# Patient Record
Sex: Female | Born: 1955
Health system: Southern US, Community
[De-identification: ages and names within clinical notes are randomized; demographics above are authoritative.]

## PROBLEM LIST (undated history)

## (undated) ENCOUNTER — Emergency Department (HOSPITAL_BASED_OUTPATIENT_CLINIC_OR_DEPARTMENT_OTHER): Admission: EM | Payer: Medicare Other

## (undated) ENCOUNTER — Emergency Department (HOSPITAL_COMMUNITY): Disposition: A | Discharge status: Home / Self Care | Payer: Medicare Other

## (undated) ENCOUNTER — Emergency Department (HOSPITAL_COMMUNITY): Discharge status: Absent without leave | Payer: Self-pay

## (undated) DIAGNOSIS — R7611 Nonspecific reaction to tuberculin skin test without active tuberculosis: Secondary | ICD-10-CM

## (undated) DIAGNOSIS — T7840XA Allergy, unspecified, initial encounter: Secondary | ICD-10-CM

## (undated) DIAGNOSIS — D229 Melanocytic nevi, unspecified: Secondary | ICD-10-CM

## (undated) DIAGNOSIS — E785 Hyperlipidemia, unspecified: Secondary | ICD-10-CM

## (undated) DIAGNOSIS — K589 Irritable bowel syndrome without diarrhea: Secondary | ICD-10-CM

## (undated) DIAGNOSIS — K219 Gastro-esophageal reflux disease without esophagitis: Secondary | ICD-10-CM

## (undated) DIAGNOSIS — L309 Dermatitis, unspecified: Secondary | ICD-10-CM

## (undated) DIAGNOSIS — J189 Pneumonia, unspecified organism: Secondary | ICD-10-CM

## (undated) DIAGNOSIS — U071 COVID-19: Secondary | ICD-10-CM

## (undated) DIAGNOSIS — J449 Chronic obstructive pulmonary disease, unspecified: Secondary | ICD-10-CM

## (undated) DIAGNOSIS — J45909 Unspecified asthma, uncomplicated: Secondary | ICD-10-CM

## (undated) DIAGNOSIS — M1990 Unspecified osteoarthritis, unspecified site: Secondary | ICD-10-CM

## (undated) DIAGNOSIS — G5603 Carpal tunnel syndrome, bilateral upper limbs: Secondary | ICD-10-CM

## (undated) DIAGNOSIS — H269 Unspecified cataract: Secondary | ICD-10-CM

## (undated) HISTORY — DX: Nonspecific reaction to tuberculin skin test without active tuberculosis: R76.11

## (undated) HISTORY — DX: Melanocytic nevi, unspecified: D22.9

## (undated) HISTORY — DX: Allergy, unspecified, initial encounter: T78.40XA

## (undated) HISTORY — DX: Unspecified asthma, uncomplicated: J45.909

## (undated) HISTORY — DX: Hyperlipidemia, unspecified: E78.5

## (undated) HISTORY — DX: Unspecified osteoarthritis, unspecified site: M19.90

## (undated) HISTORY — DX: Irritable bowel syndrome, unspecified: K58.9

## (undated) HISTORY — PX: BREAST LUMPECTOMY: SHX2

## (undated) HISTORY — PX: BREAST EXCISIONAL BIOPSY: SUR124

## (undated) HISTORY — DX: Pneumonia, unspecified organism: J18.9

## (undated) HISTORY — DX: Carpal tunnel syndrome, bilateral upper limbs: G56.03

---
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
## 1981-09-17 HISTORY — PX: VAGINAL HYSTERECTOMY: SUR661

## 1988-09-17 HISTORY — PX: BREAST EXCISIONAL BIOPSY: SUR124

## 1997-12-31 ENCOUNTER — Encounter: Admission: RE | Admit: 1997-12-31 | Discharge: 1997-12-31 | Payer: Self-pay | Admitting: Family Medicine

## 1998-04-18 ENCOUNTER — Encounter: Admission: RE | Admit: 1998-04-18 | Discharge: 1998-04-18 | Payer: Self-pay | Admitting: Family Medicine

## 1998-07-14 ENCOUNTER — Encounter: Admission: RE | Admit: 1998-07-14 | Discharge: 1998-07-14 | Payer: Self-pay | Admitting: Family Medicine

## 1998-07-28 ENCOUNTER — Encounter: Admission: RE | Admit: 1998-07-28 | Discharge: 1998-07-28 | Payer: Self-pay | Admitting: Family Medicine

## 1998-08-23 ENCOUNTER — Encounter: Admission: RE | Admit: 1998-08-23 | Discharge: 1998-10-25 | Payer: Self-pay

## 1998-08-23 ENCOUNTER — Encounter: Admission: RE | Admit: 1998-08-23 | Discharge: 1998-08-23 | Payer: Self-pay | Admitting: Sports Medicine

## 1998-09-20 ENCOUNTER — Encounter: Admission: RE | Admit: 1998-09-20 | Discharge: 1998-09-20 | Payer: Self-pay | Admitting: Sports Medicine

## 1998-10-10 ENCOUNTER — Encounter: Admission: RE | Admit: 1998-10-10 | Discharge: 1998-10-10 | Payer: Self-pay | Admitting: Family Medicine

## 1998-10-26 ENCOUNTER — Encounter: Admission: RE | Admit: 1998-10-26 | Discharge: 1998-10-26 | Payer: Self-pay | Admitting: Family Medicine

## 1998-11-08 ENCOUNTER — Encounter: Admission: RE | Admit: 1998-11-08 | Discharge: 1998-11-08 | Payer: Self-pay | Admitting: Sports Medicine

## 1998-11-17 ENCOUNTER — Encounter: Admission: RE | Admit: 1998-11-17 | Discharge: 1998-11-17 | Payer: Self-pay | Admitting: Family Medicine

## 1998-11-18 ENCOUNTER — Encounter: Payer: Self-pay | Admitting: General Surgery

## 1998-11-18 ENCOUNTER — Ambulatory Visit (HOSPITAL_COMMUNITY): Admission: RE | Admit: 1998-11-18 | Discharge: 1998-11-18 | Payer: Self-pay | Admitting: General Surgery

## 1998-12-06 ENCOUNTER — Encounter: Admission: RE | Admit: 1998-12-06 | Discharge: 1998-12-06 | Payer: Self-pay | Admitting: Sports Medicine

## 1998-12-23 ENCOUNTER — Encounter: Admission: RE | Admit: 1998-12-23 | Discharge: 1998-12-23 | Payer: Self-pay | Admitting: Family Medicine

## 1998-12-26 ENCOUNTER — Encounter: Admission: RE | Admit: 1998-12-26 | Discharge: 1998-12-26 | Payer: Self-pay | Admitting: Family Medicine

## 1999-02-14 ENCOUNTER — Encounter: Admission: RE | Admit: 1999-02-14 | Discharge: 1999-02-14 | Payer: Self-pay | Admitting: Family Medicine

## 1999-02-16 ENCOUNTER — Encounter: Admission: RE | Admit: 1999-02-16 | Discharge: 1999-02-16 | Payer: Self-pay | Admitting: Family Medicine

## 1999-03-15 ENCOUNTER — Encounter: Admission: RE | Admit: 1999-03-15 | Discharge: 1999-03-15 | Payer: Self-pay | Admitting: Family Medicine

## 1999-03-29 ENCOUNTER — Encounter: Admission: RE | Admit: 1999-03-29 | Discharge: 1999-03-29 | Payer: Self-pay | Admitting: Family Medicine

## 1999-03-30 ENCOUNTER — Emergency Department (HOSPITAL_COMMUNITY): Admission: EM | Admit: 1999-03-30 | Discharge: 1999-03-30 | Payer: Self-pay | Admitting: Emergency Medicine

## 1999-04-03 ENCOUNTER — Encounter: Admission: RE | Admit: 1999-04-03 | Discharge: 1999-04-03 | Payer: Self-pay | Admitting: Family Medicine

## 1999-04-04 ENCOUNTER — Encounter: Admission: RE | Admit: 1999-04-04 | Discharge: 1999-05-03 | Payer: Self-pay | Admitting: Family Medicine

## 1999-04-12 ENCOUNTER — Encounter: Admission: RE | Admit: 1999-04-12 | Discharge: 1999-04-12 | Payer: Self-pay | Admitting: Family Medicine

## 1999-04-21 ENCOUNTER — Encounter: Admission: RE | Admit: 1999-04-21 | Discharge: 1999-04-21 | Payer: Self-pay | Admitting: Family Medicine

## 1999-05-11 ENCOUNTER — Encounter: Admission: RE | Admit: 1999-05-11 | Discharge: 1999-05-11 | Payer: Self-pay | Admitting: Family Medicine

## 1999-05-23 ENCOUNTER — Encounter: Admission: RE | Admit: 1999-05-23 | Discharge: 1999-05-23 | Payer: Self-pay | Admitting: Family Medicine

## 1999-05-30 ENCOUNTER — Encounter: Admission: RE | Admit: 1999-05-30 | Discharge: 1999-05-30 | Payer: Self-pay | Admitting: Family Medicine

## 1999-06-13 ENCOUNTER — Encounter: Admission: RE | Admit: 1999-06-13 | Discharge: 1999-06-13 | Payer: Self-pay | Admitting: Family Medicine

## 1999-07-07 ENCOUNTER — Encounter: Admission: RE | Admit: 1999-07-07 | Discharge: 1999-07-07 | Payer: Self-pay | Admitting: Family Medicine

## 1999-07-14 ENCOUNTER — Encounter: Admission: RE | Admit: 1999-07-14 | Discharge: 1999-07-14 | Payer: Self-pay | Admitting: Sports Medicine

## 1999-07-19 ENCOUNTER — Encounter: Admission: RE | Admit: 1999-07-19 | Discharge: 1999-07-19 | Payer: Self-pay | Admitting: Sports Medicine

## 1999-07-19 ENCOUNTER — Encounter: Admission: RE | Admit: 1999-07-19 | Discharge: 1999-07-19 | Payer: Self-pay | Admitting: Family Medicine

## 1999-07-19 ENCOUNTER — Encounter: Payer: Self-pay | Admitting: Sports Medicine

## 1999-08-02 ENCOUNTER — Encounter: Admission: RE | Admit: 1999-08-02 | Discharge: 1999-08-02 | Payer: Self-pay | Admitting: Family Medicine

## 1999-08-15 ENCOUNTER — Encounter: Admission: RE | Admit: 1999-08-15 | Discharge: 1999-08-15 | Payer: Self-pay | Admitting: Sports Medicine

## 1999-08-18 ENCOUNTER — Encounter: Payer: Self-pay | Admitting: Sports Medicine

## 1999-08-18 ENCOUNTER — Encounter: Admission: RE | Admit: 1999-08-18 | Discharge: 1999-08-18 | Payer: Self-pay | Admitting: Sports Medicine

## 1999-09-01 ENCOUNTER — Encounter: Admission: RE | Admit: 1999-09-01 | Discharge: 1999-09-01 | Payer: Self-pay | Admitting: Family Medicine

## 1999-09-06 ENCOUNTER — Encounter: Payer: Self-pay | Admitting: Sports Medicine

## 1999-09-06 ENCOUNTER — Encounter: Admission: RE | Admit: 1999-09-06 | Discharge: 1999-09-06 | Payer: Self-pay | Admitting: Sports Medicine

## 1999-09-14 ENCOUNTER — Encounter: Admission: RE | Admit: 1999-09-14 | Discharge: 1999-09-14 | Payer: Self-pay | Admitting: Family Medicine

## 1999-09-21 ENCOUNTER — Encounter: Admission: RE | Admit: 1999-09-21 | Discharge: 1999-09-21 | Payer: Self-pay | Admitting: Family Medicine

## 1999-09-22 ENCOUNTER — Encounter: Admission: RE | Admit: 1999-09-22 | Discharge: 1999-09-22 | Payer: Self-pay | Admitting: Family Medicine

## 1999-09-25 ENCOUNTER — Ambulatory Visit: Admission: RE | Admit: 1999-09-25 | Discharge: 1999-09-25 | Payer: Self-pay | Admitting: *Deleted

## 1999-09-28 ENCOUNTER — Encounter: Admission: RE | Admit: 1999-09-28 | Discharge: 1999-09-28 | Payer: Self-pay | Admitting: Family Medicine

## 1999-10-12 ENCOUNTER — Encounter: Admission: RE | Admit: 1999-10-12 | Discharge: 1999-10-12 | Payer: Self-pay | Admitting: Family Medicine

## 1999-10-13 ENCOUNTER — Encounter: Admission: RE | Admit: 1999-10-13 | Discharge: 1999-10-13 | Payer: Self-pay | Admitting: Family Medicine

## 1999-10-19 ENCOUNTER — Encounter: Admission: RE | Admit: 1999-10-19 | Discharge: 1999-10-19 | Payer: Self-pay | Admitting: Family Medicine

## 1999-11-08 ENCOUNTER — Ambulatory Visit (HOSPITAL_COMMUNITY): Admission: RE | Admit: 1999-11-08 | Discharge: 1999-11-08 | Payer: Self-pay | Admitting: Family Medicine

## 1999-11-28 ENCOUNTER — Encounter: Payer: Self-pay | Admitting: Gastroenterology

## 1999-11-28 ENCOUNTER — Ambulatory Visit (HOSPITAL_COMMUNITY): Admission: RE | Admit: 1999-11-28 | Discharge: 1999-11-28 | Payer: Self-pay | Admitting: Gastroenterology

## 1999-12-06 ENCOUNTER — Encounter: Admission: RE | Admit: 1999-12-06 | Discharge: 1999-12-06 | Payer: Self-pay | Admitting: Family Medicine

## 1999-12-13 ENCOUNTER — Encounter: Admission: RE | Admit: 1999-12-13 | Discharge: 1999-12-13 | Payer: Self-pay | Admitting: Family Medicine

## 1999-12-22 ENCOUNTER — Encounter: Admission: RE | Admit: 1999-12-22 | Discharge: 1999-12-22 | Payer: Self-pay | Admitting: *Deleted

## 2000-01-10 ENCOUNTER — Encounter: Admission: RE | Admit: 2000-01-10 | Discharge: 2000-01-10 | Payer: Self-pay | Admitting: Family Medicine

## 2000-01-23 ENCOUNTER — Encounter: Admission: RE | Admit: 2000-01-23 | Discharge: 2000-01-23 | Payer: Self-pay | Admitting: Family Medicine

## 2000-03-12 ENCOUNTER — Encounter: Admission: RE | Admit: 2000-03-12 | Discharge: 2000-03-12 | Payer: Self-pay | Admitting: Family Medicine

## 2000-06-19 ENCOUNTER — Ambulatory Visit (HOSPITAL_COMMUNITY): Admission: RE | Admit: 2000-06-19 | Discharge: 2000-06-19 | Payer: Self-pay | Admitting: Gastroenterology

## 2000-06-19 ENCOUNTER — Encounter: Payer: Self-pay | Admitting: Gastroenterology

## 2000-06-24 ENCOUNTER — Encounter: Admission: RE | Admit: 2000-06-24 | Discharge: 2000-06-24 | Payer: Self-pay | Admitting: Sports Medicine

## 2000-07-11 ENCOUNTER — Encounter: Payer: Self-pay | Admitting: Gastroenterology

## 2000-07-11 ENCOUNTER — Ambulatory Visit (HOSPITAL_COMMUNITY): Admission: RE | Admit: 2000-07-11 | Discharge: 2000-07-11 | Payer: Self-pay | Admitting: Gastroenterology

## 2000-08-16 ENCOUNTER — Encounter: Admission: RE | Admit: 2000-08-16 | Discharge: 2000-08-16 | Payer: Self-pay | Admitting: Family Medicine

## 2000-11-18 ENCOUNTER — Encounter: Admission: RE | Admit: 2000-11-18 | Discharge: 2000-11-18 | Payer: Self-pay | Admitting: Family Medicine

## 2000-12-18 ENCOUNTER — Encounter: Admission: RE | Admit: 2000-12-18 | Discharge: 2000-12-18 | Payer: Self-pay | Admitting: Family Medicine

## 2000-12-19 ENCOUNTER — Encounter: Admission: RE | Admit: 2000-12-19 | Discharge: 2000-12-19 | Payer: Self-pay | Admitting: Family Medicine

## 2001-03-25 ENCOUNTER — Encounter: Admission: RE | Admit: 2001-03-25 | Discharge: 2001-03-25 | Payer: Self-pay | Admitting: Sports Medicine

## 2001-04-10 ENCOUNTER — Encounter: Admission: RE | Admit: 2001-04-10 | Discharge: 2001-04-10 | Payer: Self-pay | Admitting: Family Medicine

## 2001-05-07 ENCOUNTER — Encounter: Admission: RE | Admit: 2001-05-07 | Discharge: 2001-05-07 | Payer: Self-pay | Admitting: Family Medicine

## 2001-06-27 ENCOUNTER — Encounter: Admission: RE | Admit: 2001-06-27 | Discharge: 2001-06-27 | Payer: Self-pay | Admitting: Family Medicine

## 2001-07-17 ENCOUNTER — Encounter: Admission: RE | Admit: 2001-07-17 | Discharge: 2001-07-17 | Payer: Self-pay | Admitting: Family Medicine

## 2001-09-17 ENCOUNTER — Encounter (INDEPENDENT_AMBULATORY_CARE_PROVIDER_SITE_OTHER): Payer: Self-pay | Admitting: *Deleted

## 2001-09-26 ENCOUNTER — Encounter: Admission: RE | Admit: 2001-09-26 | Discharge: 2001-09-26 | Payer: Self-pay | Admitting: Family Medicine

## 2001-10-02 ENCOUNTER — Encounter: Admission: RE | Admit: 2001-10-02 | Discharge: 2001-10-02 | Payer: Self-pay | Admitting: *Deleted

## 2001-10-30 ENCOUNTER — Encounter: Admission: RE | Admit: 2001-10-30 | Discharge: 2001-10-30 | Payer: Self-pay | Admitting: Family Medicine

## 2001-11-13 ENCOUNTER — Encounter: Admission: RE | Admit: 2001-11-13 | Discharge: 2001-11-13 | Payer: Self-pay | Admitting: Family Medicine

## 2001-12-09 ENCOUNTER — Encounter: Admission: RE | Admit: 2001-12-09 | Discharge: 2001-12-09 | Payer: Self-pay | Admitting: Family Medicine

## 2001-12-25 ENCOUNTER — Encounter: Admission: RE | Admit: 2001-12-25 | Discharge: 2001-12-25 | Payer: Self-pay | Admitting: Family Medicine

## 2002-02-27 ENCOUNTER — Encounter: Admission: RE | Admit: 2002-02-27 | Discharge: 2002-02-27 | Payer: Self-pay | Admitting: Family Medicine

## 2002-03-06 ENCOUNTER — Encounter: Admission: RE | Admit: 2002-03-06 | Discharge: 2002-03-06 | Payer: Self-pay | Admitting: Family Medicine

## 2002-03-12 ENCOUNTER — Ambulatory Visit (HOSPITAL_COMMUNITY): Admission: RE | Admit: 2002-03-12 | Discharge: 2002-03-12 | Payer: Self-pay | Admitting: *Deleted

## 2002-05-14 ENCOUNTER — Encounter: Admission: RE | Admit: 2002-05-14 | Discharge: 2002-05-14 | Payer: Self-pay | Admitting: Family Medicine

## 2002-08-06 ENCOUNTER — Encounter: Admission: RE | Admit: 2002-08-06 | Discharge: 2002-08-06 | Payer: Self-pay | Admitting: Sports Medicine

## 2002-08-20 ENCOUNTER — Encounter: Admission: RE | Admit: 2002-08-20 | Discharge: 2002-08-20 | Payer: Self-pay | Admitting: Family Medicine

## 2002-08-20 ENCOUNTER — Ambulatory Visit (HOSPITAL_COMMUNITY): Admission: RE | Admit: 2002-08-20 | Discharge: 2002-08-20 | Payer: Self-pay | Admitting: Sports Medicine

## 2002-09-25 ENCOUNTER — Ambulatory Visit (HOSPITAL_COMMUNITY): Admission: RE | Admit: 2002-09-25 | Discharge: 2002-09-25 | Payer: Self-pay | Admitting: Sports Medicine

## 2002-10-02 ENCOUNTER — Encounter: Admission: RE | Admit: 2002-10-02 | Discharge: 2002-10-02 | Payer: Self-pay | Admitting: Sports Medicine

## 2002-11-04 ENCOUNTER — Encounter: Admission: RE | Admit: 2002-11-04 | Discharge: 2002-11-04 | Payer: Self-pay | Admitting: Family Medicine

## 2002-11-17 ENCOUNTER — Encounter: Admission: RE | Admit: 2002-11-17 | Discharge: 2002-11-17 | Payer: Self-pay | Admitting: Sports Medicine

## 2002-11-19 ENCOUNTER — Encounter: Payer: Self-pay | Admitting: Family Medicine

## 2002-11-19 ENCOUNTER — Encounter: Admission: RE | Admit: 2002-11-19 | Discharge: 2002-11-19 | Payer: Self-pay | Admitting: Family Medicine

## 2002-12-31 ENCOUNTER — Encounter: Admission: RE | Admit: 2002-12-31 | Discharge: 2002-12-31 | Payer: Self-pay | Admitting: Family Medicine

## 2003-02-01 ENCOUNTER — Emergency Department (HOSPITAL_COMMUNITY): Admission: EM | Admit: 2003-02-01 | Discharge: 2003-02-01 | Payer: Self-pay | Admitting: Emergency Medicine

## 2003-04-16 ENCOUNTER — Encounter: Admission: RE | Admit: 2003-04-16 | Discharge: 2003-04-16 | Payer: Self-pay | Admitting: Family Medicine

## 2003-05-27 ENCOUNTER — Encounter: Admission: RE | Admit: 2003-05-27 | Discharge: 2003-05-27 | Payer: Self-pay | Admitting: Family Medicine

## 2003-06-16 ENCOUNTER — Encounter: Admission: RE | Admit: 2003-06-16 | Discharge: 2003-06-16 | Payer: Self-pay | Admitting: Family Medicine

## 2003-09-18 HISTORY — PX: INDUCED ABORTION: SHX677

## 2003-10-25 ENCOUNTER — Emergency Department (HOSPITAL_COMMUNITY): Admission: EM | Admit: 2003-10-25 | Discharge: 2003-10-25 | Payer: Self-pay | Admitting: *Deleted

## 2003-12-15 ENCOUNTER — Encounter: Admission: RE | Admit: 2003-12-15 | Discharge: 2003-12-15 | Payer: Self-pay | Admitting: Family Medicine

## 2004-01-05 ENCOUNTER — Encounter: Admission: RE | Admit: 2004-01-05 | Discharge: 2004-01-05 | Payer: Self-pay | Admitting: Family Medicine

## 2004-01-12 ENCOUNTER — Encounter: Admission: RE | Admit: 2004-01-12 | Discharge: 2004-01-12 | Payer: Self-pay | Admitting: Family Medicine

## 2004-04-04 ENCOUNTER — Encounter: Admission: RE | Admit: 2004-04-04 | Discharge: 2004-04-04 | Payer: Self-pay | Admitting: Family Medicine

## 2004-05-18 ENCOUNTER — Ambulatory Visit: Payer: Self-pay | Admitting: Family Medicine

## 2004-07-27 ENCOUNTER — Ambulatory Visit: Payer: Self-pay | Admitting: Sports Medicine

## 2004-09-14 ENCOUNTER — Ambulatory Visit: Payer: Self-pay | Admitting: Family Medicine

## 2004-09-25 ENCOUNTER — Ambulatory Visit: Payer: Self-pay | Admitting: Family Medicine

## 2004-11-10 ENCOUNTER — Ambulatory Visit: Payer: Self-pay | Admitting: Family Medicine

## 2004-11-29 ENCOUNTER — Emergency Department (HOSPITAL_COMMUNITY): Admission: EM | Admit: 2004-11-29 | Discharge: 2004-11-29 | Payer: Self-pay | Admitting: Family Medicine

## 2005-01-01 ENCOUNTER — Ambulatory Visit: Payer: Self-pay | Admitting: Sports Medicine

## 2005-01-11 ENCOUNTER — Ambulatory Visit: Payer: Self-pay | Admitting: Sports Medicine

## 2005-02-07 ENCOUNTER — Encounter: Admission: RE | Admit: 2005-02-07 | Discharge: 2005-02-07 | Payer: Self-pay | Admitting: Sports Medicine

## 2005-02-22 ENCOUNTER — Encounter: Admission: RE | Admit: 2005-02-22 | Discharge: 2005-02-22 | Payer: Self-pay | Admitting: Sports Medicine

## 2005-07-09 ENCOUNTER — Ambulatory Visit: Payer: Self-pay | Admitting: Family Medicine

## 2005-07-18 ENCOUNTER — Ambulatory Visit: Payer: Self-pay | Admitting: Family Medicine

## 2005-07-24 ENCOUNTER — Ambulatory Visit: Payer: Self-pay | Admitting: Family Medicine

## 2005-08-29 ENCOUNTER — Ambulatory Visit: Payer: Self-pay | Admitting: Family Medicine

## 2005-10-17 ENCOUNTER — Ambulatory Visit: Payer: Self-pay | Admitting: Family Medicine

## 2005-11-23 ENCOUNTER — Ambulatory Visit: Payer: Self-pay | Admitting: Family Medicine

## 2005-12-24 ENCOUNTER — Ambulatory Visit: Payer: Self-pay | Admitting: Family Medicine

## 2005-12-24 ENCOUNTER — Ambulatory Visit (HOSPITAL_COMMUNITY): Admission: RE | Admit: 2005-12-24 | Discharge: 2005-12-24 | Payer: Self-pay | Admitting: Family Medicine

## 2005-12-27 ENCOUNTER — Ambulatory Visit: Payer: Self-pay | Admitting: Family Medicine

## 2006-03-04 ENCOUNTER — Ambulatory Visit: Payer: Self-pay | Admitting: Sports Medicine

## 2006-03-26 ENCOUNTER — Ambulatory Visit: Payer: Self-pay | Admitting: Sports Medicine

## 2006-03-28 ENCOUNTER — Ambulatory Visit: Payer: Self-pay | Admitting: Family Medicine

## 2006-04-12 ENCOUNTER — Ambulatory Visit: Payer: Self-pay | Admitting: Family Medicine

## 2006-05-06 ENCOUNTER — Ambulatory Visit: Payer: Self-pay | Admitting: Family Medicine

## 2006-07-29 ENCOUNTER — Ambulatory Visit: Payer: Self-pay | Admitting: Sports Medicine

## 2006-08-21 ENCOUNTER — Ambulatory Visit: Payer: Self-pay | Admitting: Family Medicine

## 2006-10-28 ENCOUNTER — Encounter: Admission: RE | Admit: 2006-10-28 | Discharge: 2006-10-28 | Payer: Self-pay | Admitting: Sports Medicine

## 2006-10-28 ENCOUNTER — Ambulatory Visit: Payer: Self-pay | Admitting: Family Medicine

## 2006-10-30 ENCOUNTER — Ambulatory Visit: Payer: Self-pay | Admitting: Sports Medicine

## 2006-11-13 ENCOUNTER — Ambulatory Visit: Payer: Self-pay | Admitting: Gastroenterology

## 2006-11-14 DIAGNOSIS — F909 Attention-deficit hyperactivity disorder, unspecified type: Secondary | ICD-10-CM | POA: Insufficient documentation

## 2006-11-14 DIAGNOSIS — J309 Allergic rhinitis, unspecified: Secondary | ICD-10-CM | POA: Insufficient documentation

## 2006-11-14 DIAGNOSIS — M7710 Lateral epicondylitis, unspecified elbow: Secondary | ICD-10-CM | POA: Insufficient documentation

## 2006-11-14 DIAGNOSIS — F17200 Nicotine dependence, unspecified, uncomplicated: Secondary | ICD-10-CM | POA: Insufficient documentation

## 2006-11-14 DIAGNOSIS — F411 Generalized anxiety disorder: Secondary | ICD-10-CM | POA: Insufficient documentation

## 2006-11-14 DIAGNOSIS — K589 Irritable bowel syndrome without diarrhea: Secondary | ICD-10-CM | POA: Insufficient documentation

## 2006-11-14 DIAGNOSIS — K219 Gastro-esophageal reflux disease without esophagitis: Secondary | ICD-10-CM | POA: Insufficient documentation

## 2006-11-14 DIAGNOSIS — N393 Stress incontinence (female) (male): Secondary | ICD-10-CM | POA: Insufficient documentation

## 2006-11-14 DIAGNOSIS — F339 Major depressive disorder, recurrent, unspecified: Secondary | ICD-10-CM | POA: Insufficient documentation

## 2006-11-14 DIAGNOSIS — N182 Chronic kidney disease, stage 2 (mild): Secondary | ICD-10-CM | POA: Insufficient documentation

## 2006-11-14 DIAGNOSIS — IMO0002 Reserved for concepts with insufficient information to code with codable children: Secondary | ICD-10-CM | POA: Insufficient documentation

## 2006-11-15 ENCOUNTER — Encounter (INDEPENDENT_AMBULATORY_CARE_PROVIDER_SITE_OTHER): Payer: Self-pay | Admitting: *Deleted

## 2006-11-27 ENCOUNTER — Ambulatory Visit: Payer: Self-pay | Admitting: Gastroenterology

## 2006-11-27 ENCOUNTER — Encounter (INDEPENDENT_AMBULATORY_CARE_PROVIDER_SITE_OTHER): Payer: Self-pay | Admitting: Specialist

## 2006-11-29 ENCOUNTER — Encounter: Payer: Self-pay | Admitting: Family Medicine

## 2007-01-14 ENCOUNTER — Encounter: Payer: Self-pay | Admitting: Family Medicine

## 2007-01-27 ENCOUNTER — Encounter: Admission: RE | Admit: 2007-01-27 | Discharge: 2007-01-27 | Payer: Self-pay | Admitting: Sports Medicine

## 2007-01-27 ENCOUNTER — Encounter: Payer: Self-pay | Admitting: Family Medicine

## 2007-01-29 ENCOUNTER — Telehealth: Payer: Self-pay | Admitting: *Deleted

## 2007-01-31 ENCOUNTER — Ambulatory Visit: Payer: Self-pay | Admitting: Family Medicine

## 2007-01-31 DIAGNOSIS — M25569 Pain in unspecified knee: Secondary | ICD-10-CM | POA: Insufficient documentation

## 2007-01-31 DIAGNOSIS — J45909 Unspecified asthma, uncomplicated: Secondary | ICD-10-CM | POA: Insufficient documentation

## 2007-02-20 ENCOUNTER — Telehealth: Payer: Self-pay | Admitting: *Deleted

## 2007-03-03 ENCOUNTER — Telehealth (INDEPENDENT_AMBULATORY_CARE_PROVIDER_SITE_OTHER): Payer: Self-pay | Admitting: Family Medicine

## 2007-03-05 ENCOUNTER — Ambulatory Visit: Payer: Self-pay | Admitting: Family Medicine

## 2007-03-10 ENCOUNTER — Telehealth: Payer: Self-pay | Admitting: *Deleted

## 2007-03-10 ENCOUNTER — Telehealth (INDEPENDENT_AMBULATORY_CARE_PROVIDER_SITE_OTHER): Payer: Self-pay | Admitting: *Deleted

## 2007-03-13 ENCOUNTER — Telehealth: Payer: Self-pay | Admitting: *Deleted

## 2007-03-13 ENCOUNTER — Emergency Department (HOSPITAL_COMMUNITY): Admission: EM | Admit: 2007-03-13 | Discharge: 2007-03-13 | Payer: Self-pay | Admitting: Emergency Medicine

## 2007-03-14 ENCOUNTER — Telehealth (INDEPENDENT_AMBULATORY_CARE_PROVIDER_SITE_OTHER): Payer: Self-pay | Admitting: *Deleted

## 2007-04-04 ENCOUNTER — Telehealth: Payer: Self-pay | Admitting: *Deleted

## 2007-04-07 ENCOUNTER — Encounter (INDEPENDENT_AMBULATORY_CARE_PROVIDER_SITE_OTHER): Payer: Self-pay | Admitting: *Deleted

## 2007-04-07 ENCOUNTER — Ambulatory Visit: Payer: Self-pay | Admitting: Family Medicine

## 2007-04-07 DIAGNOSIS — R5383 Other fatigue: Secondary | ICD-10-CM | POA: Insufficient documentation

## 2007-04-07 DIAGNOSIS — R5381 Other malaise: Secondary | ICD-10-CM | POA: Insufficient documentation

## 2007-04-08 ENCOUNTER — Encounter (INDEPENDENT_AMBULATORY_CARE_PROVIDER_SITE_OTHER): Payer: Self-pay | Admitting: *Deleted

## 2007-04-08 LAB — CONVERTED CEMR LAB
BUN: 14 mg/dL (ref 6–23)
Basophils Relative: 0 % (ref 0–1)
CO2: 26 meq/L (ref 19–32)
Calcium: 9.7 mg/dL (ref 8.4–10.5)
Chloride: 109 meq/L (ref 96–112)
Creatinine, Ser: 0.87 mg/dL (ref 0.40–1.20)
Eosinophils Relative: 2 % (ref 0–5)
HCT: 40.8 % (ref 36.0–46.0)
Hemoglobin: 13.3 g/dL (ref 12.0–15.0)
Lymphocytes Relative: 57 % — ABNORMAL HIGH (ref 12–46)
MCHC: 32.6 g/dL (ref 30.0–36.0)
Monocytes Absolute: 0.2 10*3/uL (ref 0.2–0.7)
Monocytes Relative: 6 % (ref 3–11)
Neutro Abs: 1.1 10*3/uL — ABNORMAL LOW (ref 1.7–7.7)
RBC: 4.77 M/uL (ref 3.87–5.11)

## 2007-05-23 ENCOUNTER — Telehealth: Payer: Self-pay | Admitting: Family Medicine

## 2007-07-29 ENCOUNTER — Telehealth: Payer: Self-pay | Admitting: *Deleted

## 2007-08-29 ENCOUNTER — Ambulatory Visit: Payer: Self-pay | Admitting: Nurse Practitioner

## 2007-09-01 ENCOUNTER — Telehealth (INDEPENDENT_AMBULATORY_CARE_PROVIDER_SITE_OTHER): Payer: Self-pay | Admitting: *Deleted

## 2007-09-19 ENCOUNTER — Telehealth (INDEPENDENT_AMBULATORY_CARE_PROVIDER_SITE_OTHER): Payer: Self-pay | Admitting: Nurse Practitioner

## 2007-11-05 ENCOUNTER — Ambulatory Visit: Payer: Self-pay | Admitting: Nurse Practitioner

## 2007-11-05 LAB — CONVERTED CEMR LAB
Albumin: 4.7 g/dL (ref 3.5–5.2)
Alkaline Phosphatase: 70 units/L (ref 39–117)
BUN: 14 mg/dL (ref 6–23)
Eosinophils Absolute: 0 10*3/uL (ref 0.0–0.7)
Eosinophils Relative: 1 % (ref 0–5)
Glucose, Bld: 86 mg/dL (ref 70–99)
HCT: 41.6 % (ref 36.0–46.0)
HDL: 94 mg/dL (ref >39)
Hemoglobin: 13.7 g/dL (ref 12.0–15.0)
LDL Cholesterol: 95 mg/dL (ref 0–99)
Lymphs Abs: 2 10*3/uL (ref 0.7–4.0)
MCV: 84.7 fL (ref 78.0–100.0)
Monocytes Absolute: 0.3 10*3/uL (ref 0.1–1.0)
Monocytes Relative: 7 % (ref 3–12)
Neutrophils Relative %: 48 % (ref 43–77)
Nitrite: NEGATIVE
Potassium: 4.3 meq/L (ref 3.5–5.3)
Protein, U semiquant: NEGATIVE
RBC: 4.91 M/uL (ref 3.87–5.11)
Triglycerides: 71 mg/dL (ref <150)
Urobilinogen, UA: 0.2
WBC: 4.4 10*3/uL (ref 4.0–10.5)

## 2007-11-06 ENCOUNTER — Ambulatory Visit: Payer: Self-pay | Admitting: *Deleted

## 2007-11-12 ENCOUNTER — Encounter (INDEPENDENT_AMBULATORY_CARE_PROVIDER_SITE_OTHER): Payer: Self-pay | Admitting: Nurse Practitioner

## 2007-12-01 ENCOUNTER — Telehealth (INDEPENDENT_AMBULATORY_CARE_PROVIDER_SITE_OTHER): Payer: Self-pay | Admitting: Nurse Practitioner

## 2007-12-04 ENCOUNTER — Ambulatory Visit: Payer: Self-pay | Admitting: Nurse Practitioner

## 2008-01-28 ENCOUNTER — Ambulatory Visit: Payer: Self-pay | Admitting: Nurse Practitioner

## 2008-01-28 DIAGNOSIS — K5900 Constipation, unspecified: Secondary | ICD-10-CM | POA: Insufficient documentation

## 2008-01-29 ENCOUNTER — Ambulatory Visit (HOSPITAL_COMMUNITY): Admission: RE | Admit: 2008-01-29 | Discharge: 2008-01-29 | Payer: Self-pay | Admitting: Family Medicine

## 2008-01-29 ENCOUNTER — Encounter (INDEPENDENT_AMBULATORY_CARE_PROVIDER_SITE_OTHER): Payer: Self-pay | Admitting: Nurse Practitioner

## 2008-02-04 ENCOUNTER — Encounter (INDEPENDENT_AMBULATORY_CARE_PROVIDER_SITE_OTHER): Payer: Self-pay | Admitting: Nurse Practitioner

## 2008-03-09 ENCOUNTER — Telehealth (INDEPENDENT_AMBULATORY_CARE_PROVIDER_SITE_OTHER): Payer: Self-pay | Admitting: Nurse Practitioner

## 2008-03-29 ENCOUNTER — Ambulatory Visit: Payer: Self-pay | Admitting: Nurse Practitioner

## 2008-03-29 DIAGNOSIS — G5600 Carpal tunnel syndrome, unspecified upper limb: Secondary | ICD-10-CM | POA: Insufficient documentation

## 2008-04-05 ENCOUNTER — Ambulatory Visit: Payer: Self-pay | Admitting: *Deleted

## 2008-04-06 ENCOUNTER — Telehealth (INDEPENDENT_AMBULATORY_CARE_PROVIDER_SITE_OTHER): Payer: Self-pay | Admitting: Nurse Practitioner

## 2008-04-15 ENCOUNTER — Telehealth (INDEPENDENT_AMBULATORY_CARE_PROVIDER_SITE_OTHER): Payer: Self-pay | Admitting: Nurse Practitioner

## 2008-04-23 ENCOUNTER — Ambulatory Visit: Payer: Self-pay | Admitting: Nurse Practitioner

## 2008-04-23 DIAGNOSIS — L089 Local infection of the skin and subcutaneous tissue, unspecified: Secondary | ICD-10-CM | POA: Insufficient documentation

## 2008-04-26 ENCOUNTER — Ambulatory Visit: Payer: Self-pay | Admitting: Nurse Practitioner

## 2008-05-05 ENCOUNTER — Ambulatory Visit: Payer: Self-pay | Admitting: Internal Medicine

## 2008-05-13 ENCOUNTER — Telehealth (INDEPENDENT_AMBULATORY_CARE_PROVIDER_SITE_OTHER): Payer: Self-pay | Admitting: Nurse Practitioner

## 2008-07-07 ENCOUNTER — Telehealth (INDEPENDENT_AMBULATORY_CARE_PROVIDER_SITE_OTHER): Payer: Self-pay | Admitting: Nurse Practitioner

## 2008-07-22 ENCOUNTER — Ambulatory Visit: Payer: Self-pay | Admitting: Family Medicine

## 2008-08-16 ENCOUNTER — Ambulatory Visit: Payer: Self-pay | Admitting: Nurse Practitioner

## 2008-08-16 DIAGNOSIS — H269 Unspecified cataract: Secondary | ICD-10-CM | POA: Insufficient documentation

## 2008-08-16 DIAGNOSIS — R109 Unspecified abdominal pain: Secondary | ICD-10-CM

## 2008-08-16 HISTORY — DX: Unspecified abdominal pain: R10.9

## 2008-08-16 LAB — CONVERTED CEMR LAB
Blood in Urine, dipstick: NEGATIVE
Glucose, Urine, Semiquant: NEGATIVE
Protein, U semiquant: NEGATIVE
Urobilinogen, UA: 0.2

## 2008-08-18 ENCOUNTER — Encounter (INDEPENDENT_AMBULATORY_CARE_PROVIDER_SITE_OTHER): Payer: Self-pay | Admitting: Nurse Practitioner

## 2008-08-18 LAB — CONVERTED CEMR LAB
Albumin: 4.4 g/dL (ref 3.5–5.2)
Alkaline Phosphatase: 69 units/L (ref 39–117)
BUN: 18 mg/dL (ref 6–23)
Cholesterol: 192 mg/dL (ref 0–200)
Eosinophils Absolute: 0.1 10*3/uL (ref 0.0–0.7)
Eosinophils Relative: 1 % (ref 0–5)
Glucose, Bld: 89 mg/dL (ref 70–99)
HCT: 40.2 % (ref 36.0–46.0)
HDL: 79 mg/dL (ref >39)
Hemoglobin: 13 g/dL (ref 12.0–15.0)
LDL Cholesterol: 99 mg/dL (ref 0–99)
Lymphs Abs: 1.7 10*3/uL (ref 0.7–4.0)
MCV: 85.4 fL (ref 78.0–100.0)
Monocytes Absolute: 0.3 10*3/uL (ref 0.1–1.0)
Monocytes Relative: 8 % (ref 3–12)
RBC: 4.71 M/uL (ref 3.87–5.11)
Total Bilirubin: 0.4 mg/dL (ref 0.3–1.2)
Triglycerides: 69 mg/dL (ref <150)
VLDL: 14 mg/dL (ref 0–40)
WBC: 3.5 10*3/uL — ABNORMAL LOW (ref 4.0–10.5)

## 2008-08-19 ENCOUNTER — Ambulatory Visit (HOSPITAL_COMMUNITY): Admission: RE | Admit: 2008-08-19 | Discharge: 2008-08-19 | Payer: Self-pay | Admitting: Family Medicine

## 2008-10-08 ENCOUNTER — Encounter (INDEPENDENT_AMBULATORY_CARE_PROVIDER_SITE_OTHER): Payer: Self-pay | Admitting: Nurse Practitioner

## 2008-10-11 ENCOUNTER — Telehealth (INDEPENDENT_AMBULATORY_CARE_PROVIDER_SITE_OTHER): Payer: Self-pay | Admitting: Nurse Practitioner

## 2008-11-11 ENCOUNTER — Ambulatory Visit: Payer: Self-pay | Admitting: Nurse Practitioner

## 2008-11-11 DIAGNOSIS — D72829 Elevated white blood cell count, unspecified: Secondary | ICD-10-CM | POA: Insufficient documentation

## 2008-11-11 LAB — CONVERTED CEMR LAB
Basophils Relative: 0 % (ref 0–1)
Eosinophils Absolute: 0.1 10*3/uL (ref 0.0–0.7)
Eosinophils Relative: 1 % (ref 0–5)
HCT: 39.8 % (ref 36.0–46.0)
MCHC: 32.9 g/dL (ref 30.0–36.0)
MCV: 84.7 fL (ref 78.0–100.0)
Monocytes Relative: 7 % (ref 3–12)
Neutrophils Relative %: 48 % (ref 43–77)
RBC: 4.7 M/uL (ref 3.87–5.11)
Rhuematoid fact SerPl-aCnc: <20 intl units/mL (ref 0–20)

## 2008-11-12 ENCOUNTER — Encounter (INDEPENDENT_AMBULATORY_CARE_PROVIDER_SITE_OTHER): Payer: Self-pay | Admitting: Nurse Practitioner

## 2008-12-31 ENCOUNTER — Telehealth (INDEPENDENT_AMBULATORY_CARE_PROVIDER_SITE_OTHER): Payer: Self-pay | Admitting: Nurse Practitioner

## 2009-01-07 ENCOUNTER — Ambulatory Visit: Payer: Self-pay | Admitting: Nurse Practitioner

## 2009-01-07 LAB — CONVERTED CEMR LAB
Basophils Absolute: 0 10*3/uL (ref 0.0–0.1)
Basophils Relative: 0 % (ref 0–1)
Eosinophils Absolute: 0 10*3/uL (ref 0.0–0.7)
Eosinophils Relative: 1 % (ref 0–5)
HCT: 40.2 % (ref 36.0–46.0)
Hemoglobin: 13 g/dL (ref 12.0–15.0)
MCHC: 32.3 g/dL (ref 30.0–36.0)
MCV: 84.5 fL (ref 78.0–100.0)
Monocytes Absolute: 0.3 10*3/uL (ref 0.1–1.0)
Monocytes Relative: 7 % (ref 3–12)
Neutro Abs: 2 10*3/uL (ref 1.7–7.7)
RBC: 4.76 M/uL (ref 3.87–5.11)
RDW: 13.4 % (ref 11.5–15.5)

## 2009-01-10 ENCOUNTER — Encounter (INDEPENDENT_AMBULATORY_CARE_PROVIDER_SITE_OTHER): Payer: Self-pay | Admitting: Nurse Practitioner

## 2009-01-27 ENCOUNTER — Telehealth (INDEPENDENT_AMBULATORY_CARE_PROVIDER_SITE_OTHER): Payer: Self-pay | Admitting: Nurse Practitioner

## 2009-01-28 ENCOUNTER — Ambulatory Visit: Payer: Self-pay | Admitting: Nurse Practitioner

## 2009-01-28 DIAGNOSIS — R059 Cough, unspecified: Secondary | ICD-10-CM | POA: Insufficient documentation

## 2009-01-28 DIAGNOSIS — R05 Cough: Secondary | ICD-10-CM | POA: Insufficient documentation

## 2009-01-28 DIAGNOSIS — R634 Abnormal weight loss: Secondary | ICD-10-CM | POA: Insufficient documentation

## 2009-02-15 ENCOUNTER — Ambulatory Visit (HOSPITAL_COMMUNITY): Admission: RE | Admit: 2009-02-15 | Discharge: 2009-02-15 | Payer: Self-pay | Admitting: Internal Medicine

## 2009-02-25 ENCOUNTER — Telehealth (INDEPENDENT_AMBULATORY_CARE_PROVIDER_SITE_OTHER): Payer: Self-pay | Admitting: Nurse Practitioner

## 2009-03-17 ENCOUNTER — Encounter: Admission: RE | Admit: 2009-03-17 | Discharge: 2009-03-17 | Payer: Self-pay | Admitting: Infectious Diseases

## 2009-04-28 ENCOUNTER — Ambulatory Visit: Payer: Self-pay | Admitting: Nurse Practitioner

## 2009-05-16 ENCOUNTER — Telehealth (INDEPENDENT_AMBULATORY_CARE_PROVIDER_SITE_OTHER): Payer: Self-pay | Admitting: Nurse Practitioner

## 2009-05-24 ENCOUNTER — Ambulatory Visit: Payer: Self-pay | Admitting: Nurse Practitioner

## 2009-08-04 ENCOUNTER — Ambulatory Visit: Payer: Self-pay | Admitting: Nurse Practitioner

## 2009-08-04 DIAGNOSIS — N760 Acute vaginitis: Secondary | ICD-10-CM | POA: Insufficient documentation

## 2009-08-04 LAB — CONVERTED CEMR LAB
Blood in Urine, dipstick: NEGATIVE
Ketones, urine, test strip: NEGATIVE
Nitrite: NEGATIVE
Protein, U semiquant: NEGATIVE
Urobilinogen, UA: 0.2
WBC Urine, dipstick: NEGATIVE

## 2009-08-24 ENCOUNTER — Telehealth (INDEPENDENT_AMBULATORY_CARE_PROVIDER_SITE_OTHER): Payer: Self-pay | Admitting: Nurse Practitioner

## 2009-09-13 ENCOUNTER — Encounter (INDEPENDENT_AMBULATORY_CARE_PROVIDER_SITE_OTHER): Payer: Self-pay | Admitting: Nurse Practitioner

## 2009-09-17 HISTORY — PX: BREAST CYST EXCISION: SHX579

## 2009-09-26 ENCOUNTER — Encounter (INDEPENDENT_AMBULATORY_CARE_PROVIDER_SITE_OTHER): Payer: Self-pay | Admitting: Nurse Practitioner

## 2009-10-17 ENCOUNTER — Ambulatory Visit (HOSPITAL_COMMUNITY): Admission: RE | Admit: 2009-10-17 | Discharge: 2009-10-17 | Payer: Self-pay | Admitting: Gastroenterology

## 2009-10-27 ENCOUNTER — Telehealth (INDEPENDENT_AMBULATORY_CARE_PROVIDER_SITE_OTHER): Payer: Self-pay | Admitting: Nurse Practitioner

## 2009-11-04 ENCOUNTER — Encounter (INDEPENDENT_AMBULATORY_CARE_PROVIDER_SITE_OTHER): Payer: Self-pay | Admitting: *Deleted

## 2009-11-10 ENCOUNTER — Encounter (INDEPENDENT_AMBULATORY_CARE_PROVIDER_SITE_OTHER): Payer: Self-pay | Admitting: Nurse Practitioner

## 2009-11-23 ENCOUNTER — Ambulatory Visit: Payer: Self-pay | Admitting: Nurse Practitioner

## 2009-11-23 LAB — CONVERTED CEMR LAB
Bilirubin Urine: NEGATIVE
Glucose, Urine, Semiquant: NEGATIVE
Ketones, urine, test strip: NEGATIVE
OCCULT 1: NEGATIVE
Protein, U semiquant: NEGATIVE
Rapid HIV Screen: NEGATIVE
Urobilinogen, UA: 0.2
pH: 5.5

## 2009-11-24 ENCOUNTER — Ambulatory Visit: Payer: Self-pay | Admitting: Nurse Practitioner

## 2009-11-24 LAB — CONVERTED CEMR LAB
AST: 18 units/L (ref 0–37)
Alkaline Phosphatase: 65 units/L (ref 39–117)
BUN: 18 mg/dL (ref 6–23)
Basophils Absolute: 0 10*3/uL (ref 0.0–0.1)
Basophils Relative: 0 % (ref 0–1)
Chlamydia, DNA Probe: NEGATIVE
Cholesterol: 197 mg/dL (ref 0–200)
Creatinine, Ser: 0.92 mg/dL (ref 0.40–1.20)
Eosinophils Relative: 2 % (ref 0–5)
GC Probe Amp, Genital: NEGATIVE
Glucose, Bld: 54 mg/dL — ABNORMAL LOW (ref 70–99)
HCT: 40.4 % (ref 36.0–46.0)
HDL: 78 mg/dL (ref >39)
Hemoglobin: 13.1 g/dL (ref 12.0–15.0)
Lymphocytes Relative: 55 % — ABNORMAL HIGH (ref 12–46)
MCHC: 32.4 g/dL (ref 30.0–36.0)
Monocytes Absolute: 0.3 10*3/uL (ref 0.1–1.0)
Platelets: 221 10*3/uL (ref 150–400)
RDW: 13.1 % (ref 11.5–15.5)
Total Bilirubin: 0.4 mg/dL (ref 0.3–1.2)
Total CHOL/HDL Ratio: 2.5
Triglycerides: 71 mg/dL (ref <150)
VLDL: 14 mg/dL (ref 0–40)

## 2009-12-23 ENCOUNTER — Telehealth (INDEPENDENT_AMBULATORY_CARE_PROVIDER_SITE_OTHER): Payer: Self-pay | Admitting: Nurse Practitioner

## 2009-12-26 ENCOUNTER — Ambulatory Visit: Payer: Self-pay | Admitting: Nurse Practitioner

## 2010-01-02 ENCOUNTER — Telehealth (INDEPENDENT_AMBULATORY_CARE_PROVIDER_SITE_OTHER): Payer: Self-pay | Admitting: Nurse Practitioner

## 2010-01-10 ENCOUNTER — Telehealth (INDEPENDENT_AMBULATORY_CARE_PROVIDER_SITE_OTHER): Payer: Self-pay | Admitting: Nurse Practitioner

## 2010-01-24 ENCOUNTER — Telehealth (INDEPENDENT_AMBULATORY_CARE_PROVIDER_SITE_OTHER): Payer: Self-pay | Admitting: Nurse Practitioner

## 2010-01-24 ENCOUNTER — Ambulatory Visit: Payer: Self-pay | Admitting: Nurse Practitioner

## 2010-03-09 ENCOUNTER — Encounter (INDEPENDENT_AMBULATORY_CARE_PROVIDER_SITE_OTHER): Payer: Self-pay | Admitting: Nurse Practitioner

## 2010-03-15 ENCOUNTER — Ambulatory Visit: Payer: Self-pay | Admitting: Nurse Practitioner

## 2010-03-15 ENCOUNTER — Telehealth (INDEPENDENT_AMBULATORY_CARE_PROVIDER_SITE_OTHER): Payer: Self-pay | Admitting: Nurse Practitioner

## 2010-03-17 ENCOUNTER — Ambulatory Visit (HOSPITAL_COMMUNITY): Admission: RE | Admit: 2010-03-17 | Discharge: 2010-03-17 | Payer: Self-pay | Admitting: Internal Medicine

## 2010-03-17 DIAGNOSIS — J449 Chronic obstructive pulmonary disease, unspecified: Secondary | ICD-10-CM | POA: Insufficient documentation

## 2010-04-20 ENCOUNTER — Telehealth (INDEPENDENT_AMBULATORY_CARE_PROVIDER_SITE_OTHER): Payer: Self-pay | Admitting: Nurse Practitioner

## 2010-04-28 ENCOUNTER — Ambulatory Visit: Payer: Self-pay | Admitting: Nurse Practitioner

## 2010-04-28 DIAGNOSIS — R3915 Urgency of urination: Secondary | ICD-10-CM | POA: Insufficient documentation

## 2010-04-28 LAB — CONVERTED CEMR LAB
Blood in Urine, dipstick: NEGATIVE
Ketones, urine, test strip: NEGATIVE
Nitrite: NEGATIVE
Protein, U semiquant: NEGATIVE
Specific Gravity, Urine: <1.005
Urobilinogen, UA: 0.2

## 2010-05-30 ENCOUNTER — Telehealth (INDEPENDENT_AMBULATORY_CARE_PROVIDER_SITE_OTHER): Payer: Self-pay | Admitting: Nurse Practitioner

## 2010-06-05 ENCOUNTER — Encounter (INDEPENDENT_AMBULATORY_CARE_PROVIDER_SITE_OTHER): Payer: Self-pay | Admitting: Nurse Practitioner

## 2010-06-14 ENCOUNTER — Telehealth (INDEPENDENT_AMBULATORY_CARE_PROVIDER_SITE_OTHER): Payer: Self-pay | Admitting: Nurse Practitioner

## 2010-06-16 ENCOUNTER — Ambulatory Visit: Payer: Self-pay | Admitting: Nurse Practitioner

## 2010-06-20 ENCOUNTER — Telehealth (INDEPENDENT_AMBULATORY_CARE_PROVIDER_SITE_OTHER): Payer: Self-pay | Admitting: Nurse Practitioner

## 2010-07-03 ENCOUNTER — Encounter (INDEPENDENT_AMBULATORY_CARE_PROVIDER_SITE_OTHER): Payer: Self-pay | Admitting: Nurse Practitioner

## 2010-07-03 ENCOUNTER — Ambulatory Visit (HOSPITAL_COMMUNITY): Admission: RE | Admit: 2010-07-03 | Discharge: 2010-07-03 | Payer: Self-pay | Admitting: Internal Medicine

## 2010-07-03 ENCOUNTER — Telehealth (INDEPENDENT_AMBULATORY_CARE_PROVIDER_SITE_OTHER): Payer: Self-pay | Admitting: Nurse Practitioner

## 2010-07-10 ENCOUNTER — Ambulatory Visit: Payer: Self-pay | Admitting: Nurse Practitioner

## 2010-07-10 LAB — CONVERTED CEMR LAB
BUN: 14 mg/dL (ref 6–23)
CO2: 26 meq/L (ref 19–32)
Chloride: 108 meq/L (ref 96–112)
Potassium: 4.6 meq/L (ref 3.5–5.3)

## 2010-07-12 ENCOUNTER — Encounter (INDEPENDENT_AMBULATORY_CARE_PROVIDER_SITE_OTHER): Payer: Self-pay | Admitting: Nurse Practitioner

## 2010-07-17 ENCOUNTER — Ambulatory Visit (HOSPITAL_COMMUNITY): Admission: RE | Admit: 2010-07-17 | Discharge: 2010-07-17 | Payer: Self-pay | Admitting: Internal Medicine

## 2010-07-24 ENCOUNTER — Encounter (INDEPENDENT_AMBULATORY_CARE_PROVIDER_SITE_OTHER): Payer: Self-pay | Admitting: Nurse Practitioner

## 2010-07-24 ENCOUNTER — Telehealth (INDEPENDENT_AMBULATORY_CARE_PROVIDER_SITE_OTHER): Payer: Self-pay | Admitting: Nurse Practitioner

## 2010-07-24 ENCOUNTER — Encounter
Admission: RE | Admit: 2010-07-24 | Discharge: 2010-07-24 | Payer: Self-pay | Source: Home / Self Care | Attending: Nurse Practitioner | Admitting: Nurse Practitioner

## 2010-07-28 ENCOUNTER — Telehealth (INDEPENDENT_AMBULATORY_CARE_PROVIDER_SITE_OTHER): Payer: Self-pay | Admitting: Nurse Practitioner

## 2010-08-08 ENCOUNTER — Telehealth (INDEPENDENT_AMBULATORY_CARE_PROVIDER_SITE_OTHER): Payer: Self-pay | Admitting: Nurse Practitioner

## 2010-08-22 ENCOUNTER — Ambulatory Visit: Payer: Self-pay | Admitting: Nurse Practitioner

## 2010-10-08 ENCOUNTER — Encounter: Payer: Self-pay | Admitting: Family Medicine

## 2010-10-08 ENCOUNTER — Encounter: Payer: Self-pay | Admitting: Sports Medicine

## 2010-10-17 NOTE — Letter (Signed)
Summary: Handout Printed  Printed Handout:  - Chronic Obstructive Pulmonary Disease (COPD) 

## 2010-10-17 NOTE — Progress Notes (Signed)
Summary: Office Visit/DEPRESSION SCREENING  Office Visit/DEPRESSION SCREENING   Imported By: Arta Bruce 01/25/2010 14:25:50  _____________________________________________________________________  External Attachment:    Type:   Image     Comment:   External Document

## 2010-10-17 NOTE — Letter (Signed)
Summary: *HSN Results Follow up  [facility]  [address] [place], [place] [address]   Phone: [phone]  Fax: [phone]      07/12/2010   Aimee Davis [address] [place], [place]  [address]   Dear  [name],                            ____S.Drinkard,FNP   ____D. Gore,FNP       ____B. McPherson,MD   ____V. Rankins,MD    ____E. Mulberry,MD    _X___N. Daphine Deutscher, FNP  ____D. Reche Dixon, MD    ____K. Philipp Deputy, MD    ____Other     This letter is to inform you that your recent test(s):  _______Pap Smear    ___X____Lab Test     _______X-ray    ___X____ is within acceptable limits  _______ requires a medication change  _______ requires a follow-up lab visit  _______ requires a follow-up visit with your provider   Comments: Labs ok during recent office visit.       _________________________________________________________ If you have any questions, please contact our office [phone].                    Sincerely,    Lehman Prom FNP [facility]

## 2010-10-17 NOTE — Progress Notes (Signed)
Summary: Chest CT   Phone Note Outgoing Call   Summary of Call: notify pt that provider would like for her to schedule a chest CT given her weight loss, chronic cough and history of low blood count order in box Initial call taken by: Lehman Prom FNP,  March 15, 2010 6:09 PM  Follow-up for Phone Call        I called pt  and I give her you message and I scheduler the Chest CT  03-17-10 @ 11:00am .Pt aware of the appt . Follow-up by: Cheryll Dessert,  March 16, 2010 10:49 AM  Additional Follow-up for Phone Call Additional follow up Details #1::        noted Additional Follow-up by: Lehman Prom FNP,  March 16, 2010 11:56 AM

## 2010-10-17 NOTE — Progress Notes (Signed)
Summary: STOMACH PAIN   Phone Note Call from Patient   Reason for Call: Acute Illness Summary of Call: PT IS BEING HAVING STOMACH PAIN AND SHE WANTS FNP MARTIN TO PRESCRIBE SOMENTHING AND PT USED [facility]  THANK YOU  Initial call taken by: Cheryll Dessert,  May 30, 2010 3:43 PM  Follow-up for Phone Call        Left message on answering machine for pt. to return call.  Dutch Quint RN  May 30, 2010 3:49 PM  Having IBS symptoms, not taking anything.  Having bloating, constipation, irritation, burning in her stomach. Ran out of Nexium and ranitidine.  Can't remember what she was taking for the IBS and says she needs something. Follow-up by: Dutch Quint RN,  May 30, 2010 4:07 PM  Additional Follow-up for Phone Call Additional follow up Details #1::        will refill nexium and sent to [facility] pharmacy - they may need to substitute protonix but will see what they have available regarding ibs - this is ongoing for the pt.  I have given her multiple meds but none seemed to work (see ALL meds in list to review with pt) the last medication i gave here were samples of amitiza that were available in office.  she noted it gave her some diarrhea when she took initially but she took it on an empty stomach.  she should take after eating.  Additional Follow-up by: Lehman Prom FNP,  May 30, 2010 6:01 PM    Additional Follow-up for Phone Call Additional follow up Details #2::    Left message on answering machine for pt. to return call.  Dutch Quint RN  May 31, 2010 9:10 AM  Left message on answering machine for pt. to return call.  Dutch Quint RN  June 01, 2010 4:00 PM  Left message on answering machine for pt. to return call.  Dutch Quint RN  June 02, 2010 12:45 PM  Sent letter.  Dutch Quint RN  June 05, 2010 12:53 PM   Prescriptions: NEXIUM 40 MG CPDR (ESOMEPRAZOLE MAGNESIUM) One capsule by mouth two times a day before breakfast and  before dinner  #60 x 5   Entered and Authorized by:   Lehman Prom FNP   Signed by:   Lehman Prom FNP on 05/30/2010   Method used:   Faxed to ...       [facility] (retail)       [address] [place], [place]  [address]       Ph: [phone]       Fax: [phone]   RxID:   [id number]

## 2010-10-17 NOTE — Progress Notes (Signed)
Summary: STOMACH PAIN  Phone Note Call from Patient   Caller: Patient Reason for Call: Acute Illness Summary of Call: PT IS NOT FEELING WELL HAS STOMACH PAIN WANTS TO KNOW IF NURSE CAN GIVE HER CALL. Initial call taken by: Oscar La,  June 14, 2010 12:59 PM  Follow-up for Phone Call        Left message on answering machine for pt. to return call.  Dutch Quint RN  June 14, 2010 3:39 PM  Left message on answering machine for pt. to return call.  Dutch Quint RN  June 20, 2010 12:50 PM   See succeeding phone note.  Dutch Quint RN  June 20, 2010 12:50 PM

## 2010-10-17 NOTE — Assessment & Plan Note (Signed)
Summary: Acute - Cough   Vital Signs:  Patient profile:   55 year old female Menstrual status:  partial hysterectomy Weight:      142.7 pounds BMI:     24.39 BSA:     1.70 Temp:     97.9 degrees F oral Pulse rate:   72 / minute Pulse rhythm:   regular Resp:     16 per minute BP sitting:   102 / 56  (left arm) Cuff size:   regular  Vitals Entered By: Levon Hedger (March 15, 2010 11:10 AM) CC: went to allergiist and was sent her to have some testing done...cough with phelm x 2 weeks, Cough Is Patient Diabetic? No Pain Assessment Patient in pain? no       Does patient need assistance? Functional Status Self care Ambulation Normal   CC:  went to allergiist and was sent her to have some testing done...cough with phelm x 2 weeks and Cough.  History of Present Illness:  Pt into the office after going to see the allergist in March 06, 2010. Pt reports that she is allergic to mold, dust mites and cats. She was given several medications from the allergist including famotadine, proventil Pt notes that she has moved within the past 6-9 months and her apartment is acutally below the ground.  Social - pt is not working at this time and she is not not bringing any income .  Cough      This is a 55 year old woman who presents with Cough.  The symptoms began 2 weeks ago.  The intensity is described as moderate.  The patient reports productive cough and shortness of breath, but denies wheezing and fever.  Associated symtpoms include weight loss.  The patient denies the following symptoms: sore throat and nasal congestion.  The cough is worse with smoking.  Ineffective prior treatments have included albuterol inhaler.  Risk factors include history of reflux.  Diagnostic testing to date has included CXR.    Habits & Providers  Alcohol-Tobacco-Diet     Alcohol drinks/day: 0     Tobacco Status: current     Tobacco Counseling: to quit use of tobacco products     Cigarette Packs/Day:  0.25     Year Started: age 21  Exercise-Depression-Behavior     Does Patient Exercise: no     Depression Counseling: further diagnostic testing and/or other treatment is indicated     Drug Use: never     Seat Belt Use: 100     Sun Exposure: occasionally  Allergies (verified): 1)  Penicillin G Potassium (Penicillin G Potassium)  Review of Systems General:  Complains of weight loss; denies fever; admits that her previous job had her stressing that she was ot eating properly. CV:  Denies chest pain or discomfort. Resp:  Complains of cough. GI:  Complains of constipation; pt has not retried the amitiza since the unfortunate diarrhea episode.  Physical Exam  General:  alert.   Head:  normocephalic.   Lungs:  rhonchi throughout no accessory muscle use.   Heart:  normal rate and regular rhythm.   Msk:  up to the exam table Neurologic:  alert & oriented X3.     Impression & Recommendations:  Problem # 1:  COUGH (ICD-786.2)  ongoing previous CXRAY done pt will need chest CT as the length of this is ongoing  Orders: CT without Contrast (CT w/o contrast)  Problem # 2:  WEIGHT LOSS (ICD-783.21)  consistent and of concern  given cough will need further workup   Orders: CT without Contrast (CT w/o contrast)  Problem # 3:  IRRITABLE BOWEL SYNDROME (ICD-564.1) advised pt to take amitiza at by mouth two times a day with food instead of the samples given  Complete Medication List: 1)  Advair Diskus 250-50 Mcg/dose Misc (Fluticasone-salmeterol) .... Inhale 1 puff as directed twice a day 2)  Nexium 40 Mg Cpdr (Esomeprazole magnesium) .... One capsule by mouth two times a day before breakfast and before dinner 3)  Amitiza 8 Mcg Caps (Lubiprostone) .... One capsule by mouth two times a day 4)  Trazodone Hcl 50 Mg Tabs (Trazodone hcl) .... One tablet by mouth nightly as needed for sleep 5)  Miralax Powd (Polyethylene glycol 3350) .Marland Kitchen.. 17gm mixed with 8 ounces of water or  juice daily 6)  Famotidine 20 Mg Tabs (Famotidine) .... One tablet by mouth two times a day  Patient Instructions: 1)  Amitiza - try the 8mg  by mouth two times a day for your stomach.  Remember to take with food. 2)  Cough - you will need to stop smoking 3)  May try Mucinex or Robitussin for cough. 4)  Drink plenty of fluids water, or warm tea may be beneficial 5)  You had a chest x-ray in the past 6)  May consider a Chest Ct if your cough continues especially since you are a smoker and with weight loss.  This will have to be scheduled by this office Prescriptions: AMITIZA 8 MCG CAPS (LUBIPROSTONE) One capsule by mouth two times a day  #16 x 0   Entered and Authorized by:   Lehman Prom FNP   Signed by:   Lehman Prom FNP on 03/15/2010   Method used:   Samples Given   RxID:   [id number]

## 2010-10-17 NOTE — Letter (Signed)
Summary: Generic Letter  [facility]  [address] [place], [place] [address]   Phone: [phone]  Fax: [phone]    08/22/2010  Aimee Davis [address] [place], [place]  [address]  To: [facility] [facility] From: Lehman Prom, FNP  [name] is a patient at [facility].  This office serves underinsured and/or uninsured patients. It is a pleasure to be [name]'s primary care provider.  She is on a fixed income and pays a sliding scale fee to come into the office.    It is with great pleasure that I refer her to your company for any services and assistance that you can provide.  Sincerely,    Lehman Prom FNP [facility]

## 2010-10-17 NOTE — Letter (Signed)
Summary: Generic Letter  [facility]  [address] [place], [place] [address]   Phone: [phone]  Fax: [phone]        06/05/2010  Aimee Davis [address] [place], [place]  [address]  Dear [name],  We have been unable to contact you by telephone regarding your recent call to [place].  Please call us, at your earliest convenience, so that we may speak with you.  Sincerely,   Dutch Quint RN

## 2010-10-17 NOTE — Assessment & Plan Note (Signed)
Summary: Pt left without being seen   Allergies: 1)  Penicillin G Potassium (Penicillin G Potassium)   Complete Medication List: 1)  Albuterol 90 Mcg/act Aers (Albuterol) .... Inhale 2 puff using inhaler every four hours 2)  Advair Diskus 250-50 Mcg/dose Misc (Fluticasone-salmeterol) .... Inhale 1 puff as directed twice a day 3)  Fexofenadine Hcl 180 Mg Tabs (Fexofenadine hcl) .Marland Kitchen.. 1 tab by mouth daily as needed allergies 4)  Singulair 10 Mg Tabs (Montelukast sodium) .Marland Kitchen.. 1 tablet by mouth at night 5)  Nasacort Aq 55 Mcg/act Aers (Triamcinolone acetonide(nasal)) .Marland Kitchen.. 1 spray in each nostril two times a day 6)  Feldene 20 Mg Caps (Piroxicam) .Marland Kitchen.. 1 tablet by mouth daily 7)  Miralax Powd (Polyethylene glycol 3350) .Marland Kitchen.. 1 tablespoon in 8 oz of water or juice by mouth daily 8)  Ultram 50 Mg Tabs (Tramadol hcl) .Marland Kitchen.. 1 tablet by mouth two times a day as needed for back pain 9)  Bentyl 10 Mg Caps (Dicyclomine hcl) .Marland Kitchen.. 1 capsule by mouth before meals 10)  Nexium 40 Mg Cpdr (Esomeprazole magnesium) .... One capsule by mouth two times a day before breakfast and before dinner

## 2010-10-17 NOTE — Progress Notes (Signed)
Summary: STOMACH IN PAIN  Phone Note Call from Patient Call back at Home Phone [phone]   Reason for Call: Talk to Nurse Summary of Call: Aimee Davis WANTS TO LET YOU KNOW THAT SHE IS IN GREAT PAIN WITH HER STOMACH AND THE MEDICATION ISN'T EVEN HELPING. Initial call taken by: Leodis Rains,  August 08, 2010 11:13 AM  Follow-up for Phone Call        Left message on voicemail for pt. to return call.  Dutch Quint RN  August 08, 2010 12:50 PM  Left message on answer machine for pt. to return call. Gaylyn Cheers RN  August 09, 2010 12:04 PM      Additional Follow-up for Phone Call Additional follow up Details #1::        Pt. felt like she was having muscle cramps in her abd. yesterday. Does not think the medication is helping. Reviewed diet, she is doing well with fiberous foods and avoiding spicy, fried and tomato based foods. Does not drink coffee or sodas. Encourage to exercise regularly, think about stopping smoking. Said she is now down to 4 per day. Stomach problems are worse in the am which is when she smokes. Given next available appt. 12/06. Additional Follow-up by: Gaylyn Cheers RN,  August 09, 2010 3:04 PM

## 2010-10-17 NOTE — Progress Notes (Signed)
Summary: Amitiza reaction   Phone Note Call from Patient   Summary of Call: Spoke with pt and she says that she started taking Amitiza on yesterday and she said within 45 mins of taking the medication that she started having stomach gripping  and she was vomiting and had diarrhea all night long.  she said she is feeling very weak and dizzy and she has no appetite.  She says she is not sure what she should do.  She said she was feeling fine until she took that medication. Initial call taken by: Levon Hedger,  January 10, 2010 5:07 PM  Follow-up for Phone Call        ? if pt feeling fine. In a previous note she indicated that she was having trouble sleeping.  I sent a medication for sleep to [facility] pharmacy.  did she start both the trazodone and amitiza at the same time? if so, she should take them separately. amitiza should be taken twice daily WITH meals (it is possible she could have had these effects of nausea/diarrhea if she took the medication on an empty stomach) I would advise that she retry the medication on tomorrow with directions as noted above.  Remind pt that she has ongoing problems with her stomach.  She has tried multiple other medications which have not helped either so this medication is one of last effort. Follow-up by: Lehman Prom FNP,  January 11, 2010 8:11 AM  Additional Follow-up for Phone Call Additional follow up Details #1::        Levon Hedger  Jan 18, 2010 12:46 PM Left message on machine for pt to return call to the office.  Levon Hedger  Jan 23, 2010 3:20 PM Left message on machine for pt to return call to the office.    Additional Follow-up for Phone Call Additional follow up Details #2::    discused with pt today in office Follow-up by: Lehman Prom FNP,  Jan 24, 2010 2:41 PM

## 2010-10-17 NOTE — Progress Notes (Signed)
Summary: need to talk to the nurse  Phone Note Call from Patient Call back at [phone]   Caller: Patient Summary of Call: medicine is not working she still having the same symptoms please call. need to talk to the nurse. Initial call taken by: Domenic Polite,  July 03, 2010 2:26 PM  Follow-up for Phone Call        States she is worried that she might have some kind of stomach cancer that could be causing her problems.  Advised re prior phone note -- verbalized understanding.  States she needs new Rx for the Amitiza. Follow-up by: Dutch Quint RN,  July 03, 2010 2:47 PM  Additional Follow-up for Phone Call Additional follow up Details #1::        It has been 2 years since the last CT of abdomen (order in basket - schedule for pt) since her symptoms are ongoing - i would advise that she repeat this test. She has already been to GI (i referred her earlier this year) She will need to come in for [facility] since she will need contrast. Rx for amitiza will need to faxed to [facility] pharmacy  Additional Follow-up by: Lehman Prom FNP,  July 04, 2010 9:42 AM    Additional Follow-up for Phone Call Additional follow up Details #2::    Pt. notified of refill -- faxed to [facility].  Pt. has appt. for BMP 07/10/10, appt. for CT 07/14/10 at [facility].  She says she is unable to sleep and the medication that was prescribed for her makes her dizzy and confused when she wakes up in the morning.  Dutch Quint RN  July 05, 2010 2:43 PM  nothing more to do until pt gets CT done she can stop meds if she feels she is not doing any better with them. but if they are offering any relief at all then she should continue n.martin,fnp July 05, 2010  4:14 PM  Advised of provider's response -- verbalized understanding.  Dutch Quint RN  July 06, 2010 4:31 PM    New/Updated Medications: AMITIZA 24 MCG CAPS (LUBIPROSTONE) One tablet by mouth two times a day for  stomach Prescriptions: AMITIZA 24 MCG CAPS (LUBIPROSTONE) One tablet by mouth two times a day for stomach  #60 x 3   Entered and Authorized by:   Lehman Prom FNP   Signed by:   Lehman Prom FNP on 07/04/2010   Method used:   Printed then faxed to ...       [facility] (retail)       [address] [place], [place]  [address]       Ph: [phone]       Fax: [phone]   RxID:   [id number]

## 2010-10-17 NOTE — Progress Notes (Signed)
Summary: STOMACH PROBLEMS  Phone Note Call from Patient Call back at [phone]   Summary of Call: PT HAVE A PROBLEMS WITH HER STOMATCH  THE MED THAT FNP MARTIN GIVE HER IS NOT WORKING  PLEASE CALL HER @ [phone] nora soler      Initial call taken by: Oscar La,  June 20, 2010 9:29 AM  Follow-up for Phone Call        spoke with pt she states she has not had a bowel movement yet.  She said that her stomach feels very full and she has been having some discomfort.  She said that she started her Amitiza 24 mg on yesterday, she is passing gas.  She says she is eating and it is just sitting there she says she feels like she is going to burst.  Today she says that she is going to eat her yougurt and banana to pack down in there with the rest of everything else.  she said that she has drank peppermint tea and hot water.  she is asking for some help and relief. Levon Hedger  June 20, 2010 9:53 AM   Additional Follow-up for Phone Call Additional follow up Details #1::        If she just started on amitiza on yesterday it is not INSTANT.  It is NOT a laxative.She will need to take twice daily every day to promote peristalisis and improve bowels and constipation over time. If she needs to have BM  now would advise she take a stimulant such as magnesium citrate - 1/2 bottle now which can be purchased over the counter.  Be advised that pt is taking lots of meds for her bowels so don't want to over stimulate them. She can take the other 1/2 of the bottle on tomorrow if still not effective BM.  She should be eating fiber foods such as raisins, apples with peeling to help with bowels Additional Follow-up by: Lehman Prom FNP,  June 20, 2010 9:58 AM    Additional Follow-up for Phone Call Additional follow up Details #2::    Levon Hedger  June 20, 2010 3:57 PM Left message on machine for pt to return call to the office.  Levon Hedger  June 23, 2010 3:59 PM  left  message on machine for pt to return call to the office.  Spoke with pt. and advised of provider's recommendations and instructions -- verbalized agreement. Follow-up by: Dutch Quint RN,  July 03, 2010 2:46 PM

## 2010-10-17 NOTE — Progress Notes (Signed)
Summary: pt is asking if you have sample  Phone Note Call from Patient   Summary of Call: pt wants to know if you samples at the office the medicine is not going to be ready till tuesday (amtric?/amitiza) i ask the name and she said soud like. please call her at [phone] Initial call taken by: Domenic Polite,  July 28, 2010 2:30 PM  Follow-up for Phone Call        forward to N. Martin,fnp Follow-up by: Levon Hedger,  July 28, 2010 2:38 PM  Additional Follow-up for Phone Call Additional follow up Details #1::        yes, she can come get amitiza 1 tablet by mouth two times a day disp 16 tablets Additional Follow-up by: Lehman Prom FNP,  July 28, 2010 2:40 PM    Additional Follow-up for Phone Call Additional follow up Details #2::    pt found a bag of medication that was given to her with the amitiza so she has medication and will go to pick up Rx at pharmacy. Follow-up by: Levon Hedger,  August 01, 2010 3:18 PM

## 2010-10-17 NOTE — Letter (Signed)
Summary: *HSN Results Follow up  [facility]  [address] [place], [place] [address]   Phone: [phone]  Fax: [phone]      11/24/2009   AYSHIA GRAMLICH [address] [place], [place]  [address]   Dear  Ms. Barrett Veillon,                            ____S.Drinkard,FNP   ____D. Gore,FNP       ____B. McPherson,MD   ____V. Rankins,MD    ____E. Mulberry,MD    _X___N. Daphine Deutscher, FNP  ____D. Reche Dixon, MD    ____K. Philipp Deputy, MD    ____Other     This letter is to inform you that your recent test(s):  ___X____Pap Smear    ___X____Lab Test     _______X-ray    ___X____ is within acceptable limits  _______ requires a medication change  _______ requires a follow-up lab visit  _______ requires a follow-up visit with your provider   Comments: Labs done during recent office visit show that your white blood cells are low.  This may be due to viral illness.  It was normal when last checked.  All other labs ok.  Pap Smear results _______________________________.       _________________________________________________________ If you have any questions, please contact our office [phone].                    Sincerely,   Lehman Prom FNP [facility]

## 2010-10-17 NOTE — Assessment & Plan Note (Signed)
Summary: IBS   Vital Signs:  Patient profile:   55 year old female Menstrual status:  partial hysterectomy Weight:      145.7 pounds BMI:     24.90 Temp:     97.5 degrees F oral Pulse rate:   77 / minute Pulse rhythm:   regular Resp:     20 per minute BP sitting:   116 / 70  (left arm) Cuff size:   regular  Vitals Entered By: Levon Hedger (June 16, 2010 10:52 AM) CC: stomach has been giving her a very bad episode..has been using the Nexium and it is not giving her any relief...very bad constipation Is Patient Diabetic? No Pain Assessment Patient in pain? yes     Location: stomach  Does patient need assistance? Functional Status Self care Ambulation Normal Comments did not bring medication today.   CC:  stomach has been giving her a very bad episode..has been using the Nexium and it is not giving her any relief...very bad constipation.  History of Present Illness:  Pt into the office with IBS. She has been treated with multiple medications in the past which she was not able to tolerate. " I am afraid to eat because I don't know what to eat" Pt was previously given amitiza samples but she reports she stopped taking 3 weeks ago because she did not see any results. Pt admits that at onset of symptoms it was after the day that she had been on a family reunion.  She was doing relatively well and managing up until that time. Previous abdominal CT done in 2009   She was trying to start a gluten free diet but it is expensive.   Habits & Providers  Alcohol-Tobacco-Diet     Alcohol drinks/day: 0     Tobacco Status: current     Tobacco Counseling: to quit use of tobacco products     Cigarette Packs/Day: 0.25     Year Started: age 70  Exercise-Depression-Behavior     Does Patient Exercise: no     Depression Counseling: further diagnostic testing and/or other treatment is indicated     Drug Use: never     Seat Belt Use: 100     Sun Exposure:  occasionally  Allergies (verified): 1)  Penicillin G Potassium (Penicillin G Potassium)  Review of Systems General:  Denies fever. CV:  Denies chest pain or discomfort. Resp:  Denies cough. GI:  Complains of constipation; denies diarrhea; +bloating.  Physical Exam  General:  alert.   Head:  normocephalic.   Lungs:  normal breath sounds.   Heart:  normal rate and regular rhythm.   Msk:  normal ROM.   Neurologic:  alert & oriented X3.     Impression & Recommendations:  Problem # 1:  IRRITABLE BOWEL SYNDROME (ICD-564.1) advised pt to start amitiza and she should increase to advised pt to eat according to the diet and avoid foods that increase the symptoms if symptoms continue will need repeat CT  Problem # 2:  TOBACCO DEPENDENCE (ICD-305.1) advised cessation pt would like to quit - she is not able to afford the patches and she failed chantix  Problem # 3:  NEED PROPHYLACTIC VACCINATION&INOCULATION FLU (ICD-V04.81) flu vaccine given today  Complete Medication List: 1)  Advair Diskus 250-50 Mcg/dose Misc (Fluticasone-salmeterol) .... Inhale 1 puff as directed twice a day 2)  Nexium 40 Mg Cpdr (Esomeprazole magnesium) .... One capsule by mouth two times a day before breakfast and before  dinner 3)  Amitiza 24 Mcg Caps (Lubiprostone) .... One tablet by mouth two times a day 4)  Trazodone Hcl 50 Mg Tabs (Trazodone hcl) .... One tablet by mouth nightly as needed for sleep 5)  Miralax Powd (Polyethylene glycol 3350) .Marland Kitchen.. 17gm mixed with 8 ounces of water or juice daily 6)  Hyoscyamine Sulfate 0.125 Mg Tabs (Hyoscyamine sulfate) .... One tablet by mouth daily as needed for extreme abdominal pain  Other Orders: Flu Vaccine 68yrs + (52841) Admin 1st Vaccine (32440) Admin 1st Vaccine [facility]) [id number]) Mammogram (Mammogram)  Patient Instructions: 1)  You have been given the flu vaccine today. 2)  Keep your appointment for mammogram  3)  IBS - restart the amitiza by  mouth two times a day for the remainder of the pills you have.  Then increase to the by mouth two times a day.  Remember you need to take this medication with your largest meal 4)  For REALLY bad pain you can take hyoscyamine (Prescription given).  You can purchase this from [facility] 5)  REMEMBER THE CIRCLE Prescriptions: AMITIZA 24 MCG CAPS (LUBIPROSTONE) One tablet by mouth two times a day  #16 x 0   Entered and Authorized by:   Lehman Prom FNP   Signed by:   Lehman Prom FNP on 06/16/2010   Method used:   Samples Given   RxID:   [id number] HYOSCYAMINE SULFATE 0.125 MG TABS (HYOSCYAMINE SULFATE) One tablet by mouth daily as needed for extreme abdominal pain  #30 x 0   Entered and Authorized by:   Lehman Prom FNP   Signed by:   Lehman Prom FNP on 06/16/2010   Method used:   Print then Give to Patient   RxID:   [id number]    Influenza Vaccine    Vaccine Type: Fluvax 3+    Site: right deltoid    Mfr: Gl[employer]oSmithKline    Dose: 0.5 ml    Route: IM    Given by: Levon Hedger    Exp. Date: 02/2011    Lot #: [id number]    VIS given: 04/11/10 version given June 16, 2010.  Flu Vaccine Consent Questions    Do you have a history of severe allergic reactions to this vaccine? no    Any prior history of allergic reactions to egg and/or gelatin? no    Do you have a sensitivity to the preservative Thimersol? no    Do you have a past history of Guillan-Barre Syndrome? no    Do you currently have an acute febrile illness? no    Have you ever had a severe reaction to latex? no    Vaccine information given and explained to patient? yes    Are you currently pregnant? no   ndc [phone]

## 2010-10-17 NOTE — Assessment & Plan Note (Signed)
Summary: Chronic constipation   Vital Signs:  Patient profile:   55 year old female Menstrual status:  partial hysterectomy Weight:      147.8 pounds BMI:     25.26 Temp:     97.1 degrees F oral Pulse rate:   72 / minute Pulse rhythm:   regular Resp:     16 per minute BP sitting:   102 / 60  (left arm) Cuff size:   regular  Vitals Entered By: Levon Hedger (August 22, 2010 12:16 PM)  Nutrition Counseling: Patient's BMI is greater than 25 and therefore counseled on weight management options. CC: review medication for stomach issues...quit smoking  Is Patient Diabetic? No Pain Assessment Patient in pain? no       Does patient need assistance? Functional Status Self care Ambulation Normal   CC:  review medication for stomach issues...quit smoking .  History of Present Illness:  Pt into the office for routine f/u.  Eye problems - For the past 2 weeks she has been waking with exudate in her eyes.  The left eye started first and then the right.  She uses a wash cloth and warm water over the eyes.  She does have a history of allergies.  Symptoms are worse now because she has been using some Bug spray in her apartment to help with the bug problem.  Abdominal pain - still intermittent pain and chronic constipation Recent CT done. (results in EMR) Still with pain - "I have some good days and some bad days" Chronic Constipation - pt was started on amitiza which she reports is not helpful. Admits that this time of the year her stress level has increased since it is holiday time and is not smoking.  Habits & Providers  Alcohol-Tobacco-Diet     Alcohol drinks/day: 0     Tobacco Status: current     Tobacco Counseling: to quit use of tobacco products     Cigarette Packs/Day: 0.25     Year Started: age 25  Exercise-Depression-Behavior     Does Patient Exercise: no     Depression Counseling: further diagnostic testing and/or other treatment is indicated     Drug Use:  never     Seat Belt Use: 100     Sun Exposure: occasionally  Comments: Pt is very determined to QUIT smoking. She went to a counselor on last week at [facility].  She would like to find out where she can get some patches as this worked for her in the past.  Allergies (verified): 1)  Penicillin G Potassium (Penicillin G Potassium)  Review of Systems General:  Denies fever. Eyes:  Complains of blurring, discharge, and itching. CV:  Denies chest pain or discomfort. Resp:  Denies cough. GI:  Complains of abdominal pain and constipation; denies nausea and vomiting.  Physical Exam  General:  alert.   Head:  normocephalic.   Lungs:  normal breath sounds.   Heart:  normal rate and regular rhythm.   Abdomen:  normal bowel sounds.   Msk:  up to the exam table Neurologic:  alert & oriented X3.     Impression & Recommendations:  Problem # 1:  RHINITIS, ALLERGIC (ICD-477.9)  pt has been using dry eyes without resolution of symptoms.  Her updated medication list for this problem includes:    Loratadine 10 Mg Tabs (Loratadine) ..... One tablet by mouth daily for allergies  Problem # 2:  COPD (ICD-496) Pt would like to quit smoking Her updated  medication list for this problem includes:    Advair Diskus 250-50 Mcg/dose Misc (Fluticasone-salmeterol) ..... Inhale 1 puff as directed twice a day  Problem # 3:  CONSTIPATION (ICD-564.00)  Her updated medication list for this problem includes:    Miralax Powd (Polyethylene glycol 3350) .Marland KitchenMarland KitchenMarland KitchenMarland Kitchen 17gm mixed with 8 ounces of water or juice daily  Problem # 4:  TOBACCO DEPENDENCE (ICD-305.1) pt would like to quit smoking  she would like to get a patch  Complete Medication List: 1)  Advair Diskus 250-50 Mcg/dose Misc (Fluticasone-salmeterol) .... Inhale 1 puff as directed twice a day 2)  Nexium 40 Mg Cpdr (Esomeprazole magnesium) .... One capsule by mouth two times a day before breakfast and before dinner 3)  Amitiza 24 Mcg Caps (Lubiprostone)  .... One tablet by mouth two times a day for stomach 4)  Trazodone Hcl 50 Mg Tabs (Trazodone hcl) .... One tablet by mouth nightly as needed for sleep 5)  Miralax Powd (Polyethylene glycol 3350) .Marland Kitchen.. 17gm mixed with 8 ounces of water or juice daily 6)  Hyoscyamine Sulfate 0.125 Mg Tabs (Hyoscyamine sulfate) .... One tablet by mouth daily as needed for extreme abdominal pain 7)  Loratadine 10 Mg Tabs (Loratadine) .... One tablet by mouth daily for allergies 8)  Patanol 0.1 % Soln (Olopatadine hcl) .... One drop in each eye two times a day  Patient Instructions: 1)  Go to the health department to get your eye drops 2)  The company should contact you are patches to quit smoking 3)  Follow up as needed Prescriptions: PATANOL 0.1 % SOLN (OLOPATADINE HCL) One drop in each eye two times a day  #47ml x 0   Entered and Authorized by:   Lehman Prom FNP   Signed by:   Lehman Prom FNP on 08/22/2010   Method used:   Print then Give to Patient   RxID:   [id number] LORATADINE 10 MG TABS (LORATADINE) One tablet by mouth daily for allergies  #30 x 1   Entered and Authorized by:   Lehman Prom FNP   Signed by:   Lehman Prom FNP on 08/22/2010   Method used:   Print then Give to Patient   RxID:   [id number]    Orders Added: 1)  Est. Patient Level III [84696]

## 2010-10-17 NOTE — Progress Notes (Signed)
Summary: NOT FEELING WELL AT ALL   Phone Note Call from Patient Call back at Home Phone [phone]   Reason for Call: Acute Illness Summary of Call: [facility]. [facility] CALLING TO SAY THAT FOR THE PAST 2 DAYS SHE HAS NOT BEEN FEELING WELL AT ALL. SHE HAS BEEN HAVING COLD CHILLS, WEAK,WANTING TO SLEEP ALL THE TIME, NO ENERGY AND SHE JUST DOESN'T FEEL WELL AT ALL. I OFFERED AN APPT. NEXT WEDNESDAY, BUT SHE SAYS SHE DON'T THINK SHE CAN MAKE IT THAT LONG. SHE DID MAKE THE APPT. Initial call taken by: Leodis Rains,  October 27, 2009 11:48 AM  Follow-up for Phone Call        [facility] to follow up wit pt to see how she is doing Follow-up by: Michelle Nasuti,  November 03, 2009 12:36 PM  Additional Follow-up for Phone Call Additional follow up Details #1::        Levon Hedger  November 04, 2009 3:43 PM called [phone] left message on machine for pt to return call to the office.  Will mail letter. Additional Follow-up by: Levon Hedger,  November 04, 2009 3:45 PM

## 2010-10-17 NOTE — Progress Notes (Signed)
Summary: Needs office visit   Phone Note Call from Patient   Summary of Call: COLD IN CHEST /COUGHING THROAT HURTS/NO ENGERY COUGHING UP MUNUS WHAT CAN SHE TAKE FOR THIS //[phone] Initial call taken by: Arta Bruce,  December 23, 2009 9:50 AM  Follow-up for Phone Call        called [phone] mailbox is full.  could not leave a message. Levon Hedger  December 23, 2009 9:53 AM   Additional Follow-up for Phone Call Additional follow up Details #1::        pt seen in office today Additional Follow-up by: Lehman Prom FNP,  December 26, 2009 4:00 PM

## 2010-10-17 NOTE — Assessment & Plan Note (Signed)
Summary: IBS   Vital Signs:  Patient profile:   55 year old female Menstrual status:  partial hysterectomy Weight:      146.1 pounds BMI:     24.97 BSA:     1.72 Temp:     97.9 degrees F oral Pulse rate:   66 / minute Pulse rhythm:   regular Resp:     16 per minute BP sitting:   106 / 75  (left arm) Cuff size:   regular  Vitals Entered By: Levon Hedger (Jan 24, 2010 9:22 AM) CC: pt wants to be tested to see what she is allergic to. Is Patient Diabetic? No Pain Assessment Patient in pain? no       Does patient need assistance? Functional Status Self care Ambulation Normal   CC:  pt wants to be tested to see what she is allergic to.Marland Kitchen  History of Present Illness:  Pt into the office for f/u.  IBS - Pt was started on amitiza during her last visit. She did get the medication and took one time.  She reports that as soon as she got the medication she took a pill and in as little as 30 minutes she started with diarrhea.  pt ate 2 hours prior to taking the medication. Previously tried and failed multiple meds for IBS.  She was even referred to GI for evaluation ? allergy to gluten. reports that she has tried to remove certain foods from her diet and has noticed a difference  Of concern has been pt's weight loss - she has progressively lost weight over the past 2 years. 10/2008 = 160 pounds 01/2010 = 146 pounds  Habits & Providers  Alcohol-Tobacco-Diet     Alcohol drinks/day: 0     Tobacco Status: current     Tobacco Counseling: to quit use of tobacco products     Cigarette Packs/Day: 0.25     Year Started: age 23  Exercise-Depression-Behavior     Does Patient Exercise: no     Depression Counseling: further diagnostic testing and/or other treatment is indicated     Drug Use: never     Seat Belt Use: 100     Sun Exposure: occasionally  Allergies: 1)  Penicillin G Potassium (Penicillin G Potassium)  Review of Systems General:  Complains of weight loss. GI:   Complains of abdominal pain and constipation; denies gas, indigestion, nausea, and vomiting.  Physical Exam  General:  alert.   Head:  normocephalic.   Lungs:  normal breath sounds.   Heart:  normal rate and regular rhythm.   Abdomen:  normal bowel sounds.   Msk:  normal ROM.   Neurologic:  alert & oriented X3.     Impression & Recommendations:  Problem # 1:  IRRITABLE BOWEL SYNDROME (ICD-564.1) advised pt that she did not take amitiza correctly. she needs to take WITH food she is willing to try miralax Orders: Nutrition Referral (Nutrition) Allergy Referral  (Allergy)  Complete Medication List: 1)  Advair Diskus 250-50 Mcg/dose Misc (Fluticasone-salmeterol) .... Inhale 1 puff as directed twice a day 2)  Nexium 40 Mg Cpdr (Esomeprazole magnesium) .... One capsule by mouth two times a day before breakfast and before dinner 3)  Amitiza 24 Mcg Caps (Lubiprostone) .... One capsule by mouth two times a day for chronic constipation 4)  Trazodone Hcl 50 Mg Tabs (Trazodone hcl) .... One tablet by mouth nightly as needed for sleep 5)  Miralax Powd (Polyethylene glycol 3350) .Marland Kitchen.. 17gm mixed with 8 ounces  of water or juice daily  Patient Instructions: 1)  You will be referred to the nutritionalist at [facility]. 2)  You will also be referred to the specialist for allergy - food testing. 3)  This office will have to find an office who will accept you. 4)  In the meantime, you can stop eating foods with gluten and see if your colon respond Prescriptions: MIRALAX  POWD (POLYETHYLENE GLYCOL 3350) 17gm mixed with 8 ounces of water or juice daily  #1 month qs x 3   Entered and Authorized by:   Lehman Prom FNP   Signed by:   Lehman Prom FNP on 01/24/2010   Method used:   Faxed to ...       [facility] (retail)       [address] [place], [place]  [address]       Ph: [phone]       Fax: [phone]   RxID:   [id number] AMITIZA  24 MCG CAPS (LUBIPROSTONE) One capsule by mouth two times a day for chronic constipation  #30 x 0   Entered and Authorized by:   Lehman Prom FNP   Signed by:   Lehman Prom FNP on 01/24/2010   Method used:   Historical   RxID:   [id number]   Handout requested.

## 2010-10-17 NOTE — Assessment & Plan Note (Signed)
Summary: Complete Physical Exam   Vital Signs:  Patient profile:   55 year old female Menstrual status:  partial hysterectomy Weight:      146.3 pounds BMI:     25.01 BSA:     1.72 Temp:     97.5 degrees F oral Pulse rate:   66 / minute Pulse rhythm:   regular Resp:     16 per minute BP sitting:   100 / 60  (left arm) Cuff size:   regular  Vitals Entered By: Levon Hedger (November 23, 2009 10:57 AM) CC: CPP...pain in her back and problem urinating in the morning, Depression, Abdominal Pain Pain Assessment Patient in pain? no       Does patient need assistance? Functional Status Self care Ambulation Normal     Menstrual Status partial hysterectomy Last PAP Result negative   CC:  CPP...pain in her back and problem urinating in the morning, Depression, and Abdominal Pain.  History of Present Illness:  Pt into the office for a complete physical exam.  PAP - last done in 2009. All previous normal PAP smears. No family history of cervical cancer  mammogram -  no history of breast cancer mammogram due 02/2010  Optho - wears glasses, no recent eye exam  Dental - no recent dental exam  Social - pt is now living on her own. 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  Depression History:      The patient denies recurrent thoughts of death or suicide.        The patient denies that she feels like life is not worth living, denies that she wishes that she were dead, and denies that she has thought about ending her life.        Comments:  admits that her mood is sad when her stomach is given her problems.  Dyspepsia History:      There is a prior history of GERD.  The patient does not have a prior history of documented ulcer disease.  The dominant symptom is not heartburn or acid reflux.  An H-2 blocker medication is not currently being taken.  She has no history of a positive H. Pylori serology.  No previous upper endoscopy has been done.       Habits & Providers  Alcohol-Tobacco-Diet     Alcohol drinks/day: 0     Tobacco Status: current     Tobacco Counseling: to quit use of tobacco products     Cigarette Packs/Day: 0.25     Year Started: age 19  Exercise-Depression-Behavior     Does Patient Exercise: no     Have you felt down or hopeless? no     Have you felt little pleasure in things? no     Depression Counseling: further diagnostic testing and/or other treatment is indicated     Drug Use: never     Seat Belt Use: 100     Sun Exposure: occasionally  Comments: Pt has purchased 2 boxes of nicotine patches but she has not started wearing them. PHQ-9 score = 9  Allergies (verified): 1)  Penicillin G Potassium (Penicillin G Potassium)  Review of Systems General:  Complains of sleep disorder. Eyes:  Denies blurring. ENT:  Denies earache. CV:  Denies chest pain or discomfort. Resp:  Denies cough. GI:  Complains of abdominal pain and constipation; Pt was sent to GI and had colonscopy done. still admits that she has problems with constipation. GU:  Complains of urinary frequency; denies discharge. MS:  Denies joint pain. Derm:  Denies dryness. Neuro:  Denies headaches. Psych:  Complains of depression. Endo:  Denies excessive urination.  Physical Exam  General:  alert.   Head:  normocephalic.   Eyes:  vision grossly intact, pupils equal, and pupils round.   Ears:  Bil TM with clear fluid Nose:  no nasal discharge.   Mouth:  pharynx pink and moist and fair dentition.   Neck:  supple.   Chest Wall:  no mass.   Breasts:  right - upper outer mass (noted on prevlous exam) left - normal breast Lungs:  normal breath sounds.   Heart:  normal rate and regular rhythm.   Abdomen:  decreased bowel habits  Msk:  normal ROM.   Pulses:  R radial normal and L radial normal.   Extremities:  no edema Neurologic:  alert & oriented X3.   Skin:  color normal.   Psych:  Oriented X3.    Pelvic Exam  Vulva:       normal appearance.   Urethra and Bladder:      Urethra--normal.   Vagina:      physiologic discharge.   Cervix:      absent Adnexa:      nontender bilaterally.   Rectum:      normal, heme negative stool.      Impression & Recommendations:  Problem # 1:  HEALTH MAINTENANCE EXAM (ICD-V70.0) labs done except cholesterol - pt to return tomorrow EKG done rec optho and dental exam PHQ-9 score = 9 mammogram not due until 02/2010 Orders: EKG w/ Interpretation (93000) UA Dipstick w/o Micro (manual) (16109) KOH/ WET Mount [phone]) Pap Smear, Thin Prep ( Collection of) [phone]) T- GC Chlamydia (91478) T-Comprehensive Metabolic Panel (29562-13086) T-CBC w/Diff (57846-96295) Rapid HIV  (28413) T-Syphilis Test (RPR) (24401-02725) T-TSH (36644-03474) Hemoccult Guaiac-1 spec.(in office) (82270)  Problem # 2:  CONSTIPATION (ICD-564.00)  The following medications were removed from the medication list:    Miralax Powd (Polyethylene glycol 3350) .Marland Kitchen... 1 tablespoon in 8 oz of water or juice by mouth daily  Problem # 3:  WEIGHT LOSS (ICD-783.21) most likely due to dietary changes she has made in an effort to improve her GI problems  Problem # 4:  TOBACCO DEPENDENCE (ICD-305.1) advised pt to wear patches in attempt to quit smoking  Complete Medication List: 1)  Advair Diskus 250-50 Mcg/dose Misc (Fluticasone-salmeterol) .... Inhale 1 puff as directed twice a day 2)  Nexium 40 Mg Cpdr (Esomeprazole magnesium) .... One capsule by mouth two times a day before breakfast and before dinner 3)  Amitiza 24 Mcg Caps (Lubiprostone) .... One capsule by mouth two times a day for chronic constipation 4)  Probiotic Caps (Probiotic product) .... One capsule by mouth daily  Patient Instructions: 1)  Come in the morning for fast lipids - no charge for nurse visit 2)  Do not eat after midnight tonight. 3)  Constipation - will send prescription for amitiza 24mg  by mouth two times a day  4)  You should  purchase Probiotic over the counter. [facility] has a generic version of this.  It may be about $15-20 but it will be benefical. 5)  You will be scheduled an appointment with the nutritionist at [facility]. Prescriptions: AMITIZA 24 MCG CAPS (LUBIPROSTONE) One capsule by mouth two times a day for chronic constipation  #60 x 5   Entered and Authorized by:   Lehman Prom FNP   Signed by:  Lehman Prom FNP on 11/23/2009   Method used:   Faxed to ...       [facility] (retail)       [address] [place], [place]  [address]       Ph: [phone]       Fax: [phone]   RxID:   [id number]   Laboratory Results   Urine Tests  Date/Time Received: November 23, 2009 11:17 AM   Routine Urinalysis   Color: lt. yellow Appearance: Clear Glucose: negative   (Normal Range: Negative) Bilirubin: negative   (Normal Range: Negative) Ketone: negative   (Normal Range: Negative) Spec. Gravity: 1.010   (Normal Range: 1.003-1.035) Blood: negative   (Normal Range: Negative) pH: 5.5   (Normal Range: 5.0-8.0) Protein: negative   (Normal Range: Negative) Urobilinogen: 0.2   (Normal Range: 0-1) Nitrite: negative   (Normal Range: Negative) Leukocyte Esterace: trace   (Normal Range: Negative)    Date/Time Received: November 23, 2009   Wet Mount/KOH Source: vaginal WBC/hpf: 1-5 Bacteria/hpf: rare Clue cells/hpf: none Yeast/hpf: none Trichomonas/hpf: none  Other Tests  Rapid HIV: negative  Stool - Occult Blood Hemmoccult #1: negative Date: 11/23/2009   Laboratory Results   Urine Tests    Routine Urinalysis   Color: lt. yellow Appearance: Clear Glucose: negative   (Normal Range: Negative) Bilirubin: negative   (Normal Range: Negative) Ketone: negative   (Normal Range: Negative) Spec. Gravity: 1.010   (Normal Range: 1.003-1.035) Blood: negative   (Normal Range: Negative) pH: 5.5   (Normal Range: 5.0-8.0) Protein: negative    (Normal Range: Negative) Urobilinogen: 0.2   (Normal Range: 0-1) Nitrite: negative   (Normal Range: Negative) Leukocyte Esterace: trace   (Normal Range: Negative)      Wet Mount Wet Mount KOH: Negative  Other Tests  Rapid HIV: negative  Stool - Occult Blood Hemmoccult #1: negative

## 2010-10-17 NOTE — Progress Notes (Signed)
Summary: Eyes moisturized   Phone Note Call from Patient   Summary of Call: The pt would like to know if the provider can prescribe an eye drop that allow her to keep her eyes moisturized. [facility]) Daphine Deutscher FNP Initial call taken by: Manon Hilding,  April 20, 2010 10:55 AM  Follow-up for Phone Call        Sent to N. Daphine Deutscher.  Dutch Quint RN  April 20, 2010 11:12 AM   Additional Follow-up for Phone Call Additional follow up Details #1::        she can get over the counter drops for eyes - she can ask the pharmacy at any local pharmacy for any drops she can use for dry eyes [facility] [facility] does not have these Additional Follow-up by: Lehman Prom FNP,  April 20, 2010 11:41 AM    Additional Follow-up for Phone Call Additional follow up Details #2::    Answering machine not working -- unable to leave message.  Dutch Quint RN  April 20, 2010 12:16 PM  Left message on answering machine for pt. to return call.  Dutch Quint RN  April 21, 2010 11:14 AM  Advised pt. of provider's recommendations.  Also states that she hurt her lower back about two months ago, thinks she is having some urinary difficulties as a result.  Appt. makde for 04/28/10. Follow-up by: Dutch Quint RN,  April 24, 2010 9:59 AM

## 2010-10-17 NOTE — Letter (Signed)
Summary: Out of Work  [facility]  [address] [place], [place] [address]   Phone: [phone]  Fax: [phone]    December 26, 2009   Employee:  Aimee Davis    To Whom It May Concern:   For Medical reasons, please excuse the above named employee from work for the following dates:  Start:   December 26, 2009 (seen in office today)   End:   December 27, 2009 (may return to work)  If you need additional information, please feel free to contact our office.         Sincerely,    Lehman Prom FNP [facility]

## 2010-10-17 NOTE — Letter (Signed)
[facility]: [facility]   Imported By: Arta Bruce 03/22/2010 09:41:52  _____________________________________________________________________  External Attachment:    Type:   Image     Comment:   External Document

## 2010-10-17 NOTE — Progress Notes (Signed)
Summary: Nutrition and Allergy referral   Phone Note Outgoing Call   Summary of Call: Pt needs 2 referrals 1.  nutrition referral to [facility] 2.  refer pt for allergy testing, specifically gluten testing.  Do you  know where gluten/food allergy testing is done?  Is it at the asthma/allergy site? Initial call taken by: Lehman Prom FNP,  Jan 24, 2010 2:44 PM

## 2010-10-17 NOTE — Progress Notes (Signed)
Summary: Ct results  Phone Note Outgoing Call   Summary of Call: CT of abdomen and pelvis shows that Large amount of stool present within portions of the colon  This is why pt should try to take the amitiza.  She was restarted on dose and most recently increase to by mouth daily She likely has abdominal pain and distention because of chronic constipation Initial call taken by: Lehman Prom FNP,  July 24, 2010 2:27 PM  Follow-up for Phone Call        pt informed .  She went to the nutritionist today. Follow-up by: Levon Hedger,  July 24, 2010 5:20 PM

## 2010-10-17 NOTE — Letter (Signed)
Summary: *HSN Results Follow up  [facility]  [address] [place], [place] [address]   Phone: [phone]  Fax: [phone]      11/04/2009   [name] [address] [place], [place]  [address]   Dear  Ms. Iyania Laham,                            ____S.Drinkard,FNP   ____D. Gore,FNP       ____B. McPherson,MD   ____V. Rankins,MD    ____E. Mulberry,MD    ____N. Daphine Deutscher, FNP  ____D. Reche Dixon, MD    ____K. Philipp Deputy, MD    ____Other     This letter is to inform you that your recent test(s):  _______Pap Smear    _______Lab Test     _______X-ray    _______ is within acceptable limits  _______ requires a medication change  _______ requires a follow-up lab visit  _______ requires a follow-up visit with your provider   Comments:  We have tried reaching you at [phone].  Your appointment to see your provider is scheduled on Thursday 11/10/2009 @ __________.  Please contact the office if you have any questions.       _________________________________________________________ If you have any questions, please contact our office                     Sincerely,  Levon Hedger [facility]

## 2010-10-17 NOTE — Letter (Signed)
Summary: External Correspondence [facility]  [facility]ence - [facility]   Imported By: Paula Libra 09/30/2009 16:57:44  _____________________________________________________________________  External Attachment:    Type:   Image     Comment:   External Document

## 2010-10-17 NOTE — Letter (Signed)
Summary: Handout Printed  Printed Handout:  - Celiac Disease

## 2010-10-17 NOTE — Progress Notes (Signed)
Summary: CAN'T SLEEP/needs sleep medication  Medications Added TRAZODONE HCL 50 MG TABS (TRAZODONE HCL) One tablet by mouth nightly as needed for sleep       Phone Note Call from Patient Call back at Home Phone [phone]   Summary of Call: Aimee Davis CALLED TO LET YOU KNOW THAT EVERY SINCE SHE WAS GIVEN A STEROID SHOT AND AN ANTIBIOTIC FROM HER LAST VISIT HERE, SHE SAYS THAT SHE HAS NOT BEEN ABLE TO SLEEP, SHE THINKS ITS COMING FROM THE STEROID SHOT, SHE EVEN TOOK A HALF PILL OF METALONIAL AND STILL DIDN'T SLEEP, AND SHE NEEDS SOMETHING TO HELP HER SLEEP.  SHE USES [facility].  Initial call taken by: Aimee Davis,  January 02, 2010 2:31 PM  Follow-up for Phone Call        Aimee Davis  January 03, 2010 11:35 AM Left message on machine for pt to return call to the office.  Aimee Davis  January 05, 2010 12:37 PM Left message on machine for pt to return call to the office.  Additional Follow-up for Phone Call Additional follow up Details #1::        Aimee Davis returned your call, and Aimee Lapre says that the only thing she needs is something to help her sleep at night, because every since she had the steroid shot, she can't sleep. Additional Follow-up by: Aimee Davis,  January 05, 2010 2:32 PM    Additional Follow-up for Phone Call Additional follow up Details #2::    melatonin is a good idea - natural and promotes sleep will send trazadone 50mg  by mouth nightly as needed to [facility] pharmacy she received medications on 12/26/2009 so if sleep problems were due entirely to that then they should be improving. other things she should consider if avoiding caffinated beverages - soda, coffee, tea after 5pm no naps during day avoid using bed for anything other than sleep Follow-up by: Lehman Prom FNP,  January 10, 2010 2:59 PM  Additional Follow-up for Phone Call Additional follow up Details #3:: Details for Additional Follow-up Action Taken: Aimee Davis  January 10, 2010  4:00 PM Left message on machine for pt to return call to the office.  pt informed of above information. Additional Follow-up by: Aimee Davis,  January 10, 2010 5:11 PM  New/Updated Medications: TRAZODONE HCL 50 MG TABS (TRAZODONE HCL) One tablet by mouth nightly as needed for sleep Prescriptions: TRAZODONE HCL 50 MG TABS (TRAZODONE HCL) One tablet by mouth nightly as needed for sleep  #30 x 0   Entered and Authorized by:   Lehman Prom FNP   Signed by:   Lehman Prom FNP on 01/10/2010   Method used:   Faxed to ...       [facility] (retail)       [address] [place], [place]  [address]       Ph: [phone]       Fax: [phone]   RxID:   [id number]

## 2010-10-17 NOTE — Letter (Signed)
Summary: [facility]   Imported By: Arta Bruce 08/08/2010 10:50:26  _____________________________________________________________________  External Attachment:    Type:   Image     Comment:   External Document

## 2010-10-17 NOTE — Assessment & Plan Note (Signed)
Summary: Acute - Urinary problems   Vital Signs:  Patient profile:   55 year old female Menstrual status:  partial hysterectomy Weight:      148.9 pounds BMI:     25.45 BSA:     1.73 Temp:     97.7 degrees F oral Pulse rate:   79 / minute Pulse rhythm:   regular Resp:     20 per minute BP sitting:   107 / 65  (left arm) Cuff size:   regular  Vitals Entered By: Levon Hedger (April 28, 2010 4:25 PM)  Nutrition Counseling: Patient's BMI is greater than 25 and therefore counseled on weight management options. CC: at night she goes back and forward to the bathroom and straining when she urinates x1 month and low back discomfort. Is Patient Diabetic? No Pain Assessment Patient in pain? no       Does patient need assistance? Functional Status Self care Ambulation Normal   CC:  at night she goes back and forward to the bathroom and straining when she urinates x1 month and low back discomfort.Marland Kitchen  History of Present Illness:  Pt into the office with urinary complaints that started over 1 month ago. Symptoms present mainly at night - when she feels the urge to urinate she goes to the bathroom but is unable to void.    Habits & Providers  Alcohol-Tobacco-Diet     Alcohol drinks/day: 0     Tobacco Status: current     Tobacco Counseling: to quit use of tobacco products     Cigarette Packs/Day: 0.25     Year Started: age 48  Exercise-Depression-Behavior     Does Patient Exercise: no     Have you felt down or hopeless? no     Have you felt little pleasure in things? no     Depression Counseling: further diagnostic testing and/or other treatment is indicated     Drug Use: never     Seat Belt Use: 100     Sun Exposure: occasionally  Allergies (verified): 1)  Penicillin G Potassium (Penicillin G Potassium)  Review of Systems CV:  Denies chest pain or discomfort. Resp:  Denies cough. GI:  hx of irritable bowel. GU:  Complains of nocturia and urinary frequency; denies  discharge, dysuria, and hematuria. MS:  Complains of low back pain; mainly at night - wakes with pain in lower back that gradually improves as the day goes on.  Physical Exam  General:  alert.   Head:  normocephalic.   Eyes:  glasses Lungs:  normal breath sounds.   Heart:  normal rate and regular rhythm.   Msk:  normal ROM.   Neurologic:  alert & oriented X3.     Impression & Recommendations:  Problem # 1:  URINARY URGENCY, MILD (EAV-409.81) u/a negative today symptoms may be due to bladder spasms  Problem # 2:  COPD (ICD-496) reviewed with pt handout given Her updated medication list for this problem includes:    Advair Diskus 250-50 Mcg/dose Misc (Fluticasone-salmeterol) ..... Inhale 1 puff as directed twice a day  Problem # 3:  TOBACCO DEPENDENCE (ICD-305.1) spoke with pt again about smoking. she is committed to quitting  Complete Medication List: 1)  Advair Diskus 250-50 Mcg/dose Misc (Fluticasone-salmeterol) .... Inhale 1 puff as directed twice a day 2)  Nexium 40 Mg Cpdr (Esomeprazole magnesium) .... One capsule by mouth two times a day before breakfast and before dinner 3)  Amitiza 8 Mcg Caps (Lubiprostone) .... One capsule by  mouth two times a day 4)  Trazodone Hcl 50 Mg Tabs (Trazodone hcl) .... One tablet by mouth nightly as needed for sleep 5)  Miralax Powd (Polyethylene glycol 3350) .Marland Kitchen.. 17gm mixed with 8 ounces of water or juice daily 6)  Famotidine 20 Mg Tabs (Famotidine) .... One tablet by mouth two times a day 7)  Phenazopyridine Hcl 200 Mg Tabs (Phenazopyridine hcl) .... One tablet by mouth two times a day as needed for bladder spasms  Other Orders: UA Dipstick w/o Micro (manual) (36644)  Patient Instructions: 1)  urine negative. May be bladder spasms 2)  Continue your efforts to quit smoking 3)  Follow up as needed Prescriptions: PHENAZOPYRIDINE HCL 200 MG TABS (PHENAZOPYRIDINE HCL) One tablet by mouth two times a day as needed for bladder spasms  #10 x  0   Entered and Authorized by:   Lehman Prom FNP   Signed by:   Lehman Prom FNP on 04/28/2010   Method used:   Print then Give to Patient   RxID:   [id number]   Laboratory Results   Urine Tests  Date/Time Received: April 28, 2010 4:50 PM   Routine Urinalysis   Color: lt. yellow Appearance: Clear Glucose: negative   (Normal Range: Negative) Bilirubin: negative   (Normal Range: Negative) Ketone: negative   (Normal Range: Negative) Spec. Gravity: <1.005   (Normal Range: 1.003-1.035) Blood: negative   (Normal Range: Negative) pH: 5.5   (Normal Range: 5.0-8.0) Protein: negative   (Normal Range: Negative) Urobilinogen: 0.2   (Normal Range: 0-1) Nitrite: negative   (Normal Range: Negative) Leukocyte Esterace: negative   (Normal Range: Negative)

## 2010-10-17 NOTE — Letter (Signed)
Summary: TEST ORDER FORM//CT CONTRAST//APPT DATE & TIME  TEST ORDER FORM//CT CONTRAST//APPT DATE & TIME   Imported By: Arta Bruce 07/06/2010 10:26:38  _____________________________________________________________________  External Attachment:    Type:   Image     Comment:   External Document

## 2010-10-17 NOTE — Assessment & Plan Note (Signed)
Summary: Acute - Bronchitis   Vital Signs:  Patient profile:   55 year old female Menstrual status:  partial hysterectomy Weight:      149.25 pounds BMI:     25.51 O2 Sat:      99 % on Room air Temp:     98.1 degrees F Pulse rate:   83 / minute Pulse rhythm:   regular Resp:     20 per minute BP sitting:   105 / 70  (left arm) Cuff size:   regular  Vitals Entered By: Chauncy Passy, SMA  O2 Flow:  Room air  Serial Vital Signs/Assessments:  Comments: Peak Flows 1. 165  2. 200   3. 160 By: Vesta Mixer CMA   CC: Pt. is here complaiing of chest congestion. This has been going on since last Thursday, which began w/ sore throat, sneezing and coughing up thick fleghm. Also, she's concnered about her loss of appetite. This has been going on since last week as well. , Cough Is Patient Diabetic? No Pain Assessment Patient in pain? yes     Location: chest Intensity: 8 Type: aching Onset of pain  Intermittent  Does patient need assistance? Functional Status Self care Ambulation Normal   CC:  Pt. is here complaiing of chest congestion. This has been going on since last Thursday, which began w/ sore throat, sneezing and coughing up thick fleghm. Also, she's concnered about her loss of appetite. This has been going on since last week as well. , and Cough.  History of Present Illness:  Pt into the office with complaints of cough  Cough      This is a 55 year old woman who presents with Cough.  The symptoms began 4 days ago.  The intensity is described as moderate.  The patient reports productive cough and shortness of breath, but denies wheezing and fever.  Associated symtpoms include cold/URI symptoms, sore throat, and weight loss.  The patient denies the following symptoms: nasal congestion and chronic rhinitis.  The cough is worse with smoking.  Ineffective prior treatments have included OTC cough medication.  Risk factors include history of allergic rhinitis and history of  asthma.  Diagnostic testing to date has included CXR.    Current Medications (verified): 1)  Advair Diskus 250-50 Mcg/dose Misc (Fluticasone-Salmeterol) .... Inhale 1 Puff As Directed Twice A Day 2)  Nexium 40 Mg Cpdr (Esomeprazole Magnesium) .... One Capsule By Mouth Two Times A Day Before Breakfast and Before Dinner 3)  Amitiza 24 Mcg Caps (Lubiprostone) .... One Capsule By Mouth Two Times A Day For Chronic Constipation 4)  Probiotic  Caps (Probiotic Product) .... One Capsule By Mouth Daily  Allergies (verified): 1)  Penicillin G Potassium (Penicillin G Potassium)  Review of Systems General:  Denies fever. CV:  Denies chest pain or discomfort. Resp:  Complains of cough and shortness of breath; denies chest discomfort and wheezing. GI:  Denies abdominal pain, nausea, and vomiting.  Physical Exam  General:  alert.   Head:  normocephalic.   Ears:  ear piercing(s) noted.   Lungs:  diffuse expiratory  wheezes with rhonchi Heart:  normal rate and regular rhythm.   Msk:  up to the exam table   Impression & Recommendations:  Problem # 1:  ACUTE BRONCHITIS (ICD-466.0) treatment given in office (neb) advised pt to start advair diskus and take twice daily depomedrol IM given in office will also start zithromax Her updated medication list for this problem includes:  Advair Diskus 250-50 Mcg/dose Misc (Fluticasone-salmeterol) ..... Inhale 1 puff as directed twice a day    Zithromax 250 Mg Tabs (Azithromycin) .Marland Kitchen... 2 tablets by mouth on day one then one tablet by mouth daily for total 5 days  Orders: Peak Flow Rate (94150) Pulse Oximetry (single measurment) (16109) Depo- Medrol 80mg  (J1040) Admin of Therapeutic Inj  intramuscular or subcutaneous (60454)  Complete Medication List: 1)  Advair Diskus 250-50 Mcg/dose Misc (Fluticasone-salmeterol) .... Inhale 1 puff as directed twice a day 2)  Nexium 40 Mg Cpdr (Esomeprazole magnesium) .... One capsule by mouth two times a day before  breakfast and before dinner 3)  Amitiza 24 Mcg Caps (Lubiprostone) .... One capsule by mouth two times a day for chronic constipation 4)  Probiotic Caps (Probiotic product) .... One capsule by mouth daily 5)  Zithromax 250 Mg Tabs (Azithromycin) .... 2 tablets by mouth on day one then one tablet by mouth daily for total 5 days  Patient Instructions: 1)  Use your advair 250/50 - one inhalation two times a day (rinse mouth after use) 2)  Take the azithromycin 2 tablets on Monday then one tablet by mouth daily on Tuesday - Friday. 3)  May take Mucinex for congestion (health department may have this as well) 4)  Of course, stop smoking 5)  Follow up as needed Prescriptions: ZITHROMAX 250 MG TABS (AZITHROMYCIN) 2 tablets by mouth on day one then one tablet by mouth daily for total 5 days  #6 x 0   Entered and Authorized by:   Lehman Prom FNP   Signed by:   Lehman Prom FNP on 12/26/2009   Method used:   Print then Give to Patient   RxID:   [id number] ADVAIR DISKUS 250-50 MCG/DOSE MISC (FLUTICASONE-SALMETEROL) Inhale 1 puff as directed twice a day  #1 x 5   Entered and Authorized by:   Lehman Prom FNP   Signed by:   Lehman Prom FNP on 12/26/2009   Method used:   Faxed to ...       [facility] - [facility] (retail)       [address] [place], [place]  [address]       Ph: [phone]       Fax: [phone]   RxID:   [id number]    Medication Administration  Injection # 1:    Medication: Depo- Medrol 80mg     Diagnosis: ACUTE BRONCHITIS (ICD-466.0)    Route: IM    Site: LUOQ gluteus    Exp Date: 07/17/2010    Lot #: [id number]    Mfr: Pharmacia    Comments: [id number]    Given by: Vesta Mixer CMA (December 26, 2009 3:20 PM)  Orders Added: 1)  Est. Patient Level III [87564] 2)  Peak Flow Rate [94150] 3)  Pulse Oximetry (single measurment) [94760] 4)  Depo- Medrol 80mg  [J1040] 5)  Admin of Therapeutic Inj  intramuscular  or subcutaneous [33295]

## 2010-10-31 ENCOUNTER — Telehealth (INDEPENDENT_AMBULATORY_CARE_PROVIDER_SITE_OTHER): Payer: Self-pay | Admitting: Nurse Practitioner

## 2010-11-01 ENCOUNTER — Encounter: Payer: Self-pay | Admitting: Nurse Practitioner

## 2010-11-01 ENCOUNTER — Encounter (INDEPENDENT_AMBULATORY_CARE_PROVIDER_SITE_OTHER): Payer: Self-pay | Admitting: Nurse Practitioner

## 2010-11-01 DIAGNOSIS — J069 Acute upper respiratory infection, unspecified: Secondary | ICD-10-CM

## 2010-11-01 HISTORY — DX: Acute upper respiratory infection, unspecified: J06.9

## 2010-11-08 NOTE — Assessment & Plan Note (Signed)
Summary: Acute - URI   Vital Signs:  Patient profile:   55 year old female Menstrual status:  partial hysterectomy Weight:      152.7 pounds Temp:     97.2 degrees F oral Pulse rate:   72 / minute Pulse rhythm:   regular Resp:     16 per minute BP sitting:   120 / 76  (left arm) Cuff size:   regular  Vitals Entered By: Levon Hedger (November 01, 2010 11:20 AM) CC: having alot of chest congestion, cough no energy and feeling lousy x 6 days..chest feels heavy, URI symptoms Is Patient Diabetic? No Pain Assessment Patient in pain? yes     Location: chest  Does patient need assistance? Functional Status Self care Ambulation Normal   CC:  having alot of chest congestion, cough no energy and feeling lousy x 6 days..chest feels heavy, and URI symptoms.  History of Present Illness:  Pt into the office with Upper respiratory sypmtoms       This is a 55 year old woman who presents with URI symptoms.  The symptoms began 3 days ago.  The severity is described as moderate.  The patient complains of productive cough, but denies nasal congestion, sore throat, and sick contacts.  Associated symptoms include low-grade fever (<100.5 degrees).  The patient denies wheezing.  The patient also reports headache.  The patient denies sneezing and severe fatigue.    Habits & Providers  Alcohol-Tobacco-Diet     Alcohol drinks/day: 0     Tobacco Status: quit < 6 months     Tobacco Counseling: to quit use of tobacco products     Cigarette Packs/Day: 0.25     Year Started: age 25     Year Quit: 10/2010  Exercise-Depression-Behavior     Does Patient Exercise: no     Depression Counseling: further diagnostic testing and/or other treatment is indicated     Drug Use: never     Seat Belt Use: 100     Sun Exposure: occasionally  Comments: Pt is now wearing a nicotine patch  Allergies (verified): 1)  Penicillin G Potassium (Penicillin G Potassium)  Social History: Smoking Status:  quit < 6  months  Review of Systems General:  Complains of chills. CV:  Denies chest pain or discomfort. Resp:  Complains of cough. GI:  Denies abdominal pain, nausea, and vomiting. Neuro:  Complains of headaches.  Physical Exam  General:  alert.   Head:  normocephalic.   Ears:  ear piercing(s) noted.   Lungs:  normal breath sounds.   Heart:  normal rate and regular rhythm.   Msk:  up to the exam table Neurologic:  alert & oriented X3.     Impression & Recommendations:  Problem # 1:  UPPER RESPIRATORY INFECTION (ICD-465.9) advised pt that symptoms are likely viral  she will have to monitor symptoms continue advair diskus will add cough meds Her updated medication list for this problem includes:    Loratadine 10 Mg Tabs (Loratadine) ..... One tablet by mouth daily for allergies  Problem # 2:  TOBACCO DEPENDENCE (ICD-305.1) pt quit smoking 10 days ago - she is trying her best to quit  Complete Medication List: 1)  Advair Diskus 250-50 Mcg/dose Misc (Fluticasone-salmeterol) .... Inhale 1 puff as directed twice a day 2)  Nexium 40 Mg Cpdr (Esomeprazole magnesium) .... One capsule by mouth two times a day before breakfast and before dinner 3)  Amitiza 24 Mcg Caps (Lubiprostone) .... One tablet by  mouth two times a day for stomach 4)  Trazodone Hcl 50 Mg Tabs (Trazodone hcl) .... One tablet by mouth nightly as needed for sleep 5)  Miralax Powd (Polyethylene glycol 3350) .Marland Kitchen.. 17gm mixed with 8 ounces of water or juice daily 6)  Hyoscyamine Sulfate 0.125 Mg Tabs (Hyoscyamine sulfate) .... One tablet by mouth daily as needed for extreme abdominal pain 7)  Loratadine 10 Mg Tabs (Loratadine) .... One tablet by mouth daily for allergies 8)  Patanol 0.1 % Soln (Olopatadine hcl) .... One drop in each eye two times a day  Patient Instructions: 1)  CONGRATS on quitting smoking.  This is a major accomplishment.  This will be the most beneficial for your health. 2)  You have a viral infection.  This  will improve over time. 3)  You can take some mucinex (over the counter) GENERIC for the over production of the mucus.  Drink plenty of water 4)  It is not abnormal for you have these symptoms after quitting smoking.  Your body is still going through transition.   Orders Added: 1)  Est. Patient Level III [60454]

## 2010-11-08 NOTE — Letter (Signed)
Summary: Handout Printed  Printed Handout:  - Viral Illness 

## 2010-11-08 NOTE — Progress Notes (Signed)
Summary: Cold symptoms  Phone Note Call from Patient   Summary of Call: pt called to says that she is coughing and its making her chest hurt...Marland KitchenMarland KitchenMarland Kitchen pt says she coughs up beige phelm.... no fever today.... pt says she is weak with no energy.... pt says her throat is sore from coughing...Marland KitchenMarland Kitchen pt says she is able to swallow with out pain.... [facility]....  Initial call taken by: Arm[place] Shannon,  October 31, 2010 4:35 PM  Follow-up for Phone Call        Lack of energy and cough started Friday -- productive of beige mucus, denies nasal congestion or drainage.  Had a fever late Friday evening through Saturday, which is when the weakness started, still has no energy.  Denies vomiting, has had some nausea.  Coughing more at night, has woken her up.  Throat is not sore when eating or drinking, just when she coughs.  All she wants to do is stay in bed.  Has had some dizziness when getting up, a little lightheaded when up and around, some headache.  Headache is located top of forehead, comes and goes, some blurriness in vision.   No change in voiding, drinking plenty of fluids.   Feeling better than she did Saturday, worse part is coughing that hurts in throat and chest, lack of energy.  Hasn't taken anything for cough or discomfort.    Having trouble catching her breath, has to "blow in and out" to get deep breath.  Has some CP that comes and goes whether she's coughing or not, left-sided, radiates down. Concerned since she was exposed to a child with TB in 2009.  No coughing up blood, night sweats.  No availability of appointments with provider.  Advised as to cold protocol -- humidification of home, hydration, Robitussin for cough, mucinex to loosen congestion.  If becomes SOB/can't breathe or develops CP with nausea, go to ED.  Will check for cancellations in morning and follow-up with pt.  Dutch Quint RN  October 31, 2010 6:03 PM   Additional Follow-up for Phone Call Additional follow up Details #1::         pt into the office today for visit Additional Follow-up by: Lehman Prom FNP,  November 01, 2010 11:39 AM

## 2010-11-30 ENCOUNTER — Telehealth (INDEPENDENT_AMBULATORY_CARE_PROVIDER_SITE_OTHER): Payer: Self-pay | Admitting: Nurse Practitioner

## 2010-12-05 NOTE — Progress Notes (Signed)
Summary: ALLERGY MED  Phone Note Call from Patient   Summary of Call: FNP Mercy Leppla PT WANTS HER TO PRESCRIBE SOMENTHING STRONGER FOR ALLERGIES SHE DON'T WANT TO TAKE OTC MEDS AND PT USES [facility]  AND SHE DONT WANT GO LIKE THAT OVER THE WEEKEND .YOU CAN CALL HER @ [phone] [facility] YOU  Initial call taken by: Cheryll Dessert,  November 30, 2010 11:52 AM  Follow-up for Phone Call        Has been taking Allegra and can't afford to keep buying it.  Wants something prescription that's stronger.  Can't take Singulair.  Having runny nose, itchy throat, teary and itching eyes.    Never got loratadine filled -- states she was unaware of Rx and [facility] pharmacy has no record.  She would also like patanol refilled and Advair -- last written 4/11 with 5 refills.  Refill all three per protocol?  Patanol had only one fill approved.  Follow-up by: Dutch Quint RN,  November 30, 2010 12:10 PM  Additional Follow-up for Phone Call Additional follow up Details #1::        [facility] no longer has patanol will refill advair and loratadine which is the only allergy pill [facility] will have. all others are over the counter advise pt to check with pharmacy for availabilty Additional Follow-up by: Lehman Prom FNP,  November 30, 2010 12:13 PM    Additional Follow-up for Phone Call Additional follow up Details #2::    Pt. advised of provider's response - notified of Rxs.  Dutch Quint RN  November 30, 2010 12:22 PM   Prescriptions: LORATADINE 10 MG TABS (LORATADINE) One tablet by mouth daily for allergies  #30 x 11   Entered and Authorized by:   Lehman Prom FNP   Signed by:   Lehman Prom FNP on 11/30/2010   Method used:   Faxed to ...       [facility] - [facility] (retail)       [address] [place], [place]  [address]       Ph: [phone]       Fax: [phone]   RxID:   [id number] ADVAIR DISKUS 250-50 MCG/DOSE MISC (FLUTICASONE-SALMETEROL) Inhale  1 puff as directed twice a day  #1 x 11   Entered and Authorized by:   Lehman Prom FNP   Signed by:   Lehman Prom FNP on 11/30/2010   Method used:   Faxed to ...       [facility] - Pharmac (retail)       [address] [place], [place]  [address]       Ph: [phone]       Fax: [phone]   RxID:   [id number]

## 2011-02-05 ENCOUNTER — Encounter: Payer: Self-pay | Attending: Nurse Practitioner | Admitting: *Deleted

## 2011-02-05 DIAGNOSIS — Z713 Dietary counseling and surveillance: Secondary | ICD-10-CM | POA: Insufficient documentation

## 2011-02-05 DIAGNOSIS — R634 Abnormal weight loss: Secondary | ICD-10-CM | POA: Insufficient documentation

## 2011-06-30 ENCOUNTER — Inpatient Hospital Stay (INDEPENDENT_AMBULATORY_CARE_PROVIDER_SITE_OTHER)
Admission: RE | Admit: 2011-06-30 | Discharge: 2011-06-30 | Disposition: A | Discharge status: Home / Self Care | Payer: Self-pay | Source: Ambulatory Visit | Attending: Emergency Medicine | Admitting: Emergency Medicine

## 2011-06-30 DIAGNOSIS — J069 Acute upper respiratory infection, unspecified: Secondary | ICD-10-CM

## 2011-06-30 DIAGNOSIS — J45909 Unspecified asthma, uncomplicated: Secondary | ICD-10-CM

## 2011-08-29 ENCOUNTER — Other Ambulatory Visit (HOSPITAL_COMMUNITY): Payer: Self-pay | Admitting: Physician Assistant

## 2011-08-29 DIAGNOSIS — Z1231 Encounter for screening mammogram for malignant neoplasm of breast: Secondary | ICD-10-CM

## 2011-09-06 ENCOUNTER — Telehealth (HOSPITAL_COMMUNITY): Payer: Self-pay | Admitting: *Deleted

## 2011-09-20 ENCOUNTER — Other Ambulatory Visit (HOSPITAL_COMMUNITY): Payer: Self-pay | Admitting: Gastroenterology

## 2011-09-20 DIAGNOSIS — K589 Irritable bowel syndrome without diarrhea: Secondary | ICD-10-CM

## 2011-09-21 ENCOUNTER — Other Ambulatory Visit (HOSPITAL_COMMUNITY): Payer: Self-pay | Admitting: Gastroenterology

## 2011-09-21 ENCOUNTER — Ambulatory Visit (HOSPITAL_COMMUNITY)
Admission: RE | Admit: 2011-09-21 | Discharge: 2011-09-21 | Disposition: A | Discharge status: Home / Self Care | Payer: Self-pay | Source: Ambulatory Visit | Attending: Gastroenterology | Admitting: Gastroenterology

## 2011-09-21 DIAGNOSIS — R109 Unspecified abdominal pain: Secondary | ICD-10-CM | POA: Insufficient documentation

## 2011-09-21 DIAGNOSIS — K589 Irritable bowel syndrome without diarrhea: Secondary | ICD-10-CM

## 2011-10-03 ENCOUNTER — Ambulatory Visit (HOSPITAL_COMMUNITY)
Admission: RE | Admit: 2011-10-03 | Discharge: 2011-10-03 | Disposition: A | Discharge status: Home / Self Care | Payer: Self-pay | Source: Ambulatory Visit | Attending: Physician Assistant | Admitting: Physician Assistant

## 2011-10-03 DIAGNOSIS — Z1231 Encounter for screening mammogram for malignant neoplasm of breast: Secondary | ICD-10-CM | POA: Insufficient documentation

## 2012-05-15 ENCOUNTER — Telehealth: Payer: Self-pay | Admitting: *Deleted

## 2012-05-15 NOTE — Telephone Encounter (Signed)
Pt called wanted to be seen earlier in clinic as a new pt from [facility] for IBS. Pt states has seen Dr Dondra Prader [facility] and [facility] clinic for IBS- are treating pt with med for IBS. Suggest to call [facility] GI clinic since they are currently tx IBS. Pt hung phone up while talking. Tried to call pt back x 2 -  first time picked up phone would not talk and second time ID recording. Talked with Ellwood Dense and Doris - Geologist, engineering. Stanton Kidney Loki Wuthrich RN 05/15/12 11:30AM

## 2012-05-15 NOTE — Telephone Encounter (Signed)
Talked with pt  Unable to move new pt appt to be seen earlier per Tyler Aas - again for problems with IBS to call [facility] GI clinic. Stanton Kidney Egypt Welcome RN 05/15/12 1:45PM

## 2012-06-03 ENCOUNTER — Encounter: Payer: Self-pay | Admitting: Internal Medicine

## 2012-06-05 ENCOUNTER — Ambulatory Visit (INDEPENDENT_AMBULATORY_CARE_PROVIDER_SITE_OTHER): Payer: Self-pay | Admitting: Internal Medicine

## 2012-06-05 ENCOUNTER — Encounter: Payer: Self-pay | Admitting: Internal Medicine

## 2012-06-05 VITALS — BP 116/69 | HR 71 | Temp 96.9°F | Ht 64.0 in | Wt 152.3 lb

## 2012-06-05 DIAGNOSIS — K219 Gastro-esophageal reflux disease without esophagitis: Secondary | ICD-10-CM

## 2012-06-05 DIAGNOSIS — F17200 Nicotine dependence, unspecified, uncomplicated: Secondary | ICD-10-CM

## 2012-06-05 DIAGNOSIS — J449 Chronic obstructive pulmonary disease, unspecified: Secondary | ICD-10-CM

## 2012-06-05 DIAGNOSIS — G4700 Insomnia, unspecified: Secondary | ICD-10-CM

## 2012-06-05 DIAGNOSIS — R3581 Nocturnal polyuria: Secondary | ICD-10-CM

## 2012-06-05 DIAGNOSIS — F339 Major depressive disorder, recurrent, unspecified: Secondary | ICD-10-CM

## 2012-06-05 DIAGNOSIS — K589 Irritable bowel syndrome without diarrhea: Secondary | ICD-10-CM

## 2012-06-05 DIAGNOSIS — M549 Dorsalgia, unspecified: Secondary | ICD-10-CM

## 2012-06-05 DIAGNOSIS — J45909 Unspecified asthma, uncomplicated: Secondary | ICD-10-CM

## 2012-06-05 DIAGNOSIS — F5101 Primary insomnia: Secondary | ICD-10-CM | POA: Insufficient documentation

## 2012-06-05 DIAGNOSIS — G5600 Carpal tunnel syndrome, unspecified upper limb: Secondary | ICD-10-CM

## 2012-06-05 DIAGNOSIS — R519 Headache, unspecified: Secondary | ICD-10-CM

## 2012-06-05 DIAGNOSIS — R109 Unspecified abdominal pain: Secondary | ICD-10-CM

## 2012-06-05 DIAGNOSIS — G8929 Other chronic pain: Secondary | ICD-10-CM

## 2012-06-05 DIAGNOSIS — Z23 Encounter for immunization: Secondary | ICD-10-CM

## 2012-06-05 DIAGNOSIS — R51 Headache: Secondary | ICD-10-CM

## 2012-06-05 DIAGNOSIS — K5900 Constipation, unspecified: Secondary | ICD-10-CM

## 2012-06-05 DIAGNOSIS — R351 Nocturia: Secondary | ICD-10-CM

## 2012-06-05 HISTORY — DX: Primary insomnia: F51.01

## 2012-06-05 MED ORDER — FLUTICASONE PROPIONATE 50 MCG/ACT NA SUSP: 1.0000 | Freq: Every day | NASAL | Status: DC

## 2012-06-05 NOTE — Assessment & Plan Note (Signed)
Claims to currently smoke approximately 4-5 cigarettes a day and a smoking history of 10+ years. Has tried to cut in the past however due to family issues and stress has picked it back up. Is currently trying to cut down again. Has been referred to wanted 100 quit now which she claims has helped her cut down in the past and will try again this time.  -Smoking cessation advised -Refer to 1 800 quit now

## 2012-06-05 NOTE — Assessment & Plan Note (Signed)
Claims to be stable and well controlled with Advair. Does not use Advair daily as she does not need it. Claims to use occasionally once every 2-3 days.  Claims that she was previously told she has COPD but denies having pulmonary function testing. Denies current shortness of breath, dyspnea on exertion.  -Continue Advair -Continue to monitor

## 2012-06-05 NOTE — Assessment & Plan Note (Signed)
Claims to have 19 year history of abdominal problems thought to be secondary to inflammatory bowel syndrome originally diagnosed at [facility]. Is followed by Dr. Doristine Mango at [facility].  Claims to have been tried on Bentyl however had severe abdominal pain vomiting and nausea secondary to the medication and discontinued since then. Is currently on no medication. Has done lots of research on her own and thinks it might be secondary to celiac disease. TTG testing done in the past was within normal limits. She claims to have relief from symptoms when she follows a gluten-free diet however has not followed completely in the past because was unsure if she does indeed have celiac disease. Denies diarrhea but does complain of constipation which she controls with over-the-counter medications and fiber heavy diet. Has a history of colonoscopy in March 2008 positive for sigmoid polyp tubular adenoma no high grade dysplasia or malignancy noted. Her symptoms involve spasms of the stomach, constant abdominal pain as severe as childbirth 10 out of 10 at times, worse when lying down and in the morning better with movement.  -Will conduct a celiac panel -Provided literature on gluten-free diet as requested, if gluten-free diet does indeed provide relief may start trial of such diet -Continue to monitor her -Tracking obtain records from Dr. Chestine Spore at [facility]

## 2012-06-05 NOTE — Assessment & Plan Note (Signed)
Claims to have bilateral carpal, with right wrist pain worse than left. 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 Denies surgery.  Occasional numbness and tingling. Has not tried wrist splints. Does not want surgery at this time.  -Will continue to monitor

## 2012-06-05 NOTE — Assessment & Plan Note (Signed)
Claims to have COPD however no record of PFTs. Positive history of tobacco abuse. Denies shortness of breath. History of asthma.  -Will continue to monitor

## 2012-06-05 NOTE — Patient Instructions (Signed)
Please follow up with me in one month and we will go over results  Try gluten free diet in the meantime  Call clinic if you have questions or concerns  Gluten-Free Diet Gluten is a protein found in many grains. Gluten is present in wheat, rye, and barley. Gluten may cause intestinal injury when ingested by people who are sensitive to gluten. A tissue sample (biopsy) of the small intestine is usually required for a positive diagnosis of gluten sensitivity. Dietary treatment consists of eliminating foods and food ingredients from wheat, rye, and barley. When these are excluded completely from the diet, most patients regain function of the small intestine. Strict compliance is important even during symptom-free periods. Gluten sensitive patients must realize that this is a lifelong diet. During the first stages of treatment, some people will also need to restrict dairy products that contain lactose, which is a naturally occurring sugar. Lactose is difficult to absorb when the small intestines are damaged. This is called lactose intolerance. WHY YOU NEED THIS DIET Ask your caregiver to explain the conditions that you may have.  Celiac disease / nontropical sprue / gluten-sensitive enteropathy.   Dermatitis herpetiformis.  SPECIAL NOTES  Gluten from wheat, rye, and barley protein interferes with absorbing food in individuals with gluten sensitivity. It is important to read all labels, as gluten may have been added as an incidental ingredient. Words to check for on the label include: flour, starch, durum flour, graham flour, phosphated flour, self-rising flour, semolina, farina, modified food starch, cereal, thickening, fillers, emulsifiers, malt flavoring, hydrolyzed vegetable protein. A Registered Dietician can help you identify possible harmful ingredients in the foods you normally eat.   If you are not sure whether an ingredient contains gluten, be sure to check with the manufacturer. Note that  some manufacturers may change ingredients without notice. Always read labels.  Since flour and cereal products are quite often used in the preparation of foods, it is important to be aware of the methods of preparation used, as well as the foods themselves. This is especially true when you are dining out. Starches  Allowed: Only those prepared from arrowroot, corn, potato, rice, and bean flours. Rice wafers (*); pure cornmeal tortillas; popcorn; some crackers and chips(*). Hot cereals made from cornmeal; Cream of Rice. Cold cereals such as puffed rice, Kellogg's Sugar Pops, Post's Fruity & Chocolate Pebbles, [employer] and [employer], Featherweight's [employer]; [employer] Cocoa Puffs, and Gluten-free Oatmeal. White or sweet potatoes; yams; hominy; rice or wild rice; special gluten-free pasta. Some oriental rice noodles or bean noodles.   Avoid: All wheat, and rye cereals; wheat germ, barley, bran, graham, malt, bulgur, and millet (-). NOTE: Avoid cereals containing malt as a flavoring such as Rice Krispies. Regular noodles, spaghetti, macaroni, most packaged rice mixes(*). All others containing wheat, rye, and/or barley.  Vegetables  Allowed: All plain, fresh, frozen, or canned vegetables.   Avoid: Creamed vegetables(*), vegetables canned in sauces(*). Any prepared with wheat, rye, or barley.  Fruit  Allowed: All fresh, frozen, canned, or dried fruits. Fruit juices.   Avoid: Thickened or prepared fruits; some pie fillings(*).  Meat and Meat Substitutes  Allowed: Meat, fish, poultry, or eggs prepared without added wheat, rye, or barley; luncheon meat(*), frankfurters(*), and pure meat. All aged cheese, processed cheese products(*). Cottage cheese(+), cream cheese(+). Dried beans and peas; lentils.   Avoid: Any meat or meat alternate containing wheat, rye, barley, or gluten stabilizers; Bread-containing products such as Swiss steak, croquettes,  and meatloaf. Tuna canned  in vegetable broth(*); [place] with HVP injected as part of the basting; any cheese product containing oat gum as an ingredient.  Milk  Allowed: Milk. Yogurt made with allowed ingredients(*).   Avoid: Commercial chocolate milk which may have cereal added(*). Malted milk.  Soups and Combination Foods  Allowed: Homemade broth and soups made with allowed ingredients; some canned or frozen soups are allowed(*). Combination or prepared foods that do not contain gluten(*). Read labels.   Avoid: All soups containing wheat, rye, or barley flour. Bouillon and bouillon cubes that contain hydrolyzed vegetable protein (HVP). Combination or prepared foods that do contain gluten(*).  Desserts  Allowed: Custard, junket, homemade puddings from cornstarch, rice, and tapioca; some pudding mixes(*). Gelatin desserts, ices, and sherbet(*). Cake, cookies, and other desserts prepared with allowed flours.Some commercial ice creams(*).   Avoid: Cakes, cookies, doughnuts, pastries, etc., prepared with wheat, rye, and/or barley flour. Some commercial ice creams(*), ice cream flavors which contain cookies, crumbs, or cheesecake(*); ice cream cones. All commercially prepared mixes for cakes, cookies, and other desserts(*); bread pudding; puddings thickened with flour.  Sweets  Allowed: Sugar, honey, syrup(*), molasses, jelly, jam, plain hard candy, marshmallows, gumdrops, homemade candies free from wheat, rye, or barley. Coconut.   Avoid: Commercial candies containing wheat, rye, or barley(*). [place] is dusted with wheat flour. Chocolate-coated nuts, which are often rolled in flour.  Fats and Oils  Allowed: Butter, margarine, vegetable oil, sour cream(+), whipping cream, shortening, lard, cream, mayonnaise(*). Some commercial salad dressings(*). Peanut butter.   Avoid: Some commercial salad dressings(*).  Beverages  Allowed: Coffee (regular or decaffeinated), tea, herbal tea (read label to be sure that no wheat  flour has been added). Carbonated beverages; some root beers(*).   Avoid: Cereal beverages such as Postum or Ovaltine ; beer (unless Gluten free), ale, malted milk; some root beers, wine, and sake.  Condiments  Allowed: Salt, pepper, herbs, spices, extracts, food colorings; monosodium glutamate (MSG); cider, rice, and wine vinegar; bicarbonate of soda; baking powder; Chun King soy sauce; nuts, coconut, chocolate, and pure cocoa powder.   Avoid: Some curry powder (*), some dry seasoning mixes (*), some gravy extracts (*), some meat sauces (*), some catsup (*), some prepared mustard (*), horseradish (*), some soy sauce (*), chip dips (*), some chewing gum (*). Yeast extract (contains barley). Caramel color (may contain malt).  Flour and thickening agents Allowed: Arrowroot starch (A), Corn bran (B), Corn flour (B,C,D), Corn germ (B), Cornmeal (B,C,D), Corn starch (A), Potato flour (B,C,E), Potato starch flour (B,C,E), Rice bran (B), Rice flours: Plain, brown (B,C,D,E) Sweet (A,B,C,F). Rice polish (B,C,G), Soy flour (B,C,G), Tapioca starch (A). ARE GOOD FOR: (A) Good thickening agent (B) Good when combined with other flours (C) Best combined with milk and eggs in baked products (D) Best in grainy-textured products (E) Produces drier product than other flours (F) Produces moister product than other flours (G) Adds distinct flavor to product; use in moderation. (*) Check labels and investigate any questionable ingredients.  (-) Additional research is needed before this product can be recommended. (+) Check vegetable gum used. * These amounts indicate the minimum number of servings needed from the basic food groups to provide a variety of nutrients essential to good health. The word "maximum" is used if amounts of certain foods eaten must be controlled. Combination foods may count as full or partial servings from the food groups. Dark green, leafy, or orange vegetables are recommended 3 or 4 times  weekly  to provide vitamin A. A good source of vitamin C is recommended daily.  NOTE: Brand names are used for clarification only, and do not constitute an endorsement. Also, ingredients may be changed by the manufacturer without notice. Always read labels. SAMPLE MEAL PLAN Breakfast   Fruit or juice.   Cereal (from allowed grains).   Toast (from allowed grains).   Heart-healthy tub margarine   Jam or jelly.   Milk beverage.  Lunch  Meat or meat substitute.   Gluten-free bread.   Vegetable or salad.   Heart-healthy margarine.   Fruit or dessert.   Milk beverage.  Dinner  Meat or meat substitute.   Potato or rice.   Salad with dressing or soup.   Gluten-free bread.   Vegetable.   Fruit or dessert.   Heart-healthy margarine.   Beverage.  SAMPLE MENU Breakfast   Orange juice.   Banana.   Rice or corn cereal.   Toast (gluten-free bread).   Heart-healthy tub margarine.   Jam.   Milk.   Coffee or tea.  Lunch  Chicken salad sandwich (with gluten-free bread and mayonnaise).   Sliced tomatoes.   Heart-healthy tub margarine.   Apple.   Milk.   Coffee or tea.  Dinner  [employer].   Baked potato.   Broccoli.   Lettuce salad with gluten-free dressing.   Gluten-free bread.   Custard.   Heart-healthy margarine.   Coffee or tea.  These meal plans are provided as samples. Your daily meal plans will vary. Document Released: 09/03/2005 Document Revised: 08/23/2011 Document Reviewed: 08/06/2011 [facility] Patient Information 2012 [place], [place].

## 2012-06-05 NOTE — Assessment & Plan Note (Signed)
Claims to have multiple family stressors however currently denies depression. Denies any suicidal or homicidal ideation. This is her first visit to our office, will try to review prior records for any indication of depression.
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
## 2012-06-05 NOTE — Assessment & Plan Note (Signed)
Endorses several year history of trouble falling asleep at night. Finally found relief with melatonin 3 mg that she takes over-the-counter. Was previously on Ambien which made her groggy even during the day. Would like to continue melatonin at this time.  -Continue to monitor

## 2012-06-05 NOTE — Assessment & Plan Note (Signed)
Complains of getting up in the middle of the night approximately 3-4 times to urinate. Drinks plenty of water during the day. Denies any dysuria or flank pain or urgency and claims to have good flow. No history of diabetes.

## 2012-06-05 NOTE — Assessment & Plan Note (Signed)
Stable. Relief with Flonase 50 mcg.  -Continue Flonase

## 2012-06-05 NOTE — Assessment & Plan Note (Signed)
Currently taking milk of magnesia, flaxseed, Metamucil as needed along with fiber or heavy diet to assist with constipation. Associated with abdominal pain. Carries diagnosis of IBS.  -Continue to monitor

## 2012-06-05 NOTE — Assessment & Plan Note (Addendum)
Complains of acid reflux with heavy foods and fatty foods. Stable at this time. Occasionally takes Nexium.  -Continue to monitor

## 2012-06-05 NOTE — Assessment & Plan Note (Signed)
Complains of 19 year history of crampy constant abdominal pain as severe as 10 out of 10 at times described as occasional spasms, better with movement, located primarily in periumbilical region, tenderness to deep palpation described as a sore feeling, worse with lying down and first thing in the morning. Associated with constipation. Has been tried on multiple medications for possible inflammatory bowel syndrome last one being Bentyl for which she also had an upset stomach vomit and nausea. Claims to also be lactose intolerance. Endorses having improvement in symptoms when trying gluten-free diet as much as possible. Would like to continue to try gluten-free diet. Thinks it's likely secondary to celiac disease after all her research.    CT A/P 2011: Large amount of stool present within portions of the colon as  described above. Otherwise unremarkable abdominal pelvic CT with no explanation for the patient's persistent abdominal pain noted.  -Followup celiac panel -Followup gluten-free diet modifications literature provided today -Will continue to monitor

## 2012-06-06 LAB — URINALYSIS, ROUTINE W REFLEX MICROSCOPIC
Bilirubin Urine: NEGATIVE
Glucose, UA: NEGATIVE mg/dL
Specific Gravity, Urine: 1.016 (ref 1.005–1.030)
Urobilinogen, UA: 0.2 mg/dL (ref 0.0–1.0)
pH: 6 (ref 5.0–8.0)

## 2012-06-06 LAB — GLIA (IGA/G) + TTG IGA: Gliadin IgA: 1 U/mL (ref <20)

## 2012-06-06 LAB — URINALYSIS, MICROSCOPIC ONLY
Bacteria, UA: NONE SEEN
Crystals: NONE SEEN

## 2012-06-06 NOTE — Progress Notes (Signed)
INTERNAL MEDICINE TEACHING ATTENDING ADDENDUM Lonzo Cloud , MD: I personally saw and evaluated Mr.Jump in this clinic visit in conjunction with the resident, Dr. Virgina Organ. I have discussed the patient's plan of care with Dr. Virgina Organ during this visit. I have confirmed the physical exam findings and have read and agree with the clinic note including the plan.

## 2012-07-14 ENCOUNTER — Telehealth: Payer: Self-pay | Admitting: *Deleted

## 2012-07-14 ENCOUNTER — Ambulatory Visit (INDEPENDENT_AMBULATORY_CARE_PROVIDER_SITE_OTHER): Payer: Self-pay | Admitting: Internal Medicine

## 2012-07-14 VITALS — BP 92/65 | HR 85 | Temp 97.0°F | Ht 64.0 in | Wt 153.4 lb

## 2012-07-14 DIAGNOSIS — R7611 Nonspecific reaction to tuberculin skin test without active tuberculosis: Secondary | ICD-10-CM | POA: Insufficient documentation

## 2012-07-14 DIAGNOSIS — M549 Dorsalgia, unspecified: Secondary | ICD-10-CM

## 2012-07-14 DIAGNOSIS — G8929 Other chronic pain: Secondary | ICD-10-CM

## 2012-07-14 DIAGNOSIS — M545 Low back pain, unspecified: Secondary | ICD-10-CM

## 2012-07-14 DIAGNOSIS — K589 Irritable bowel syndrome without diarrhea: Secondary | ICD-10-CM

## 2012-07-14 NOTE — Progress Notes (Signed)
HPI Ms. Fiorenza is a 56 y.o. year old lady with a 2 week history of right lower back pain. She has tried OTC treatments including NSAIDs, creams, patches, and heating pads. These have provided her with slight relief of her pain; as she says, 'taking the edge off.' She was unable to sleep last night 2/2 to the pain, which she grades a 10/10 in intensity. The pain is a dull, throbbing pain which she compares to a toothache in its quality. It radiates down the lateral aspect of her right thigh, spanning from the PSIS to about 4 inches above her knee. Hip flexion makes her pain worse, as does hip extension to a lesser extent. She states that this back pain is not like her chronic lower back pain, and represents something new. She reports that her sister died 2-3 weeks ago, and she has been helping her mother out around the house since that time.   She has no red flag symptoms, including: weight loss, hx of cancer, dysuria, UTIs, focal neuropathy (motor/sensory deficits), recent trauma, recent overexertion (lifiting heavy objects), IVDU, immunosuppressant drugs, HIV?AIDS, or hx of back surgery.  ROS: General: no fevers, chills, changes in weight, changes in appetite Skin: no rash HEENT: no blurry vision, hearing changes, sore throat Pulm: no dyspnea, coughing, wheezing CV: no chest pain, palpitations, shortness of breath Abd: no abdominal pain, nausea/vomiting, diarrhea/constipation GU: no dysuria, hematuria, polyuria Ext: no arthralgias, myalgias Neuro: no weakness, numbness, or tingling  Filed Vitals:   07/14/12 1436  BP: 92/65  Pulse: 85  Temp: 97 F (36.1 C)    PEX General: alert, cooperative, and in no apparent distress HEENT: pupils equal round and reactive to light, vision grossly intact, oropharynx clear and non-erythematous  Neck: supple, no lymphadenopathy, JVD, or carotid bruits Lungs: clear to ascultation bilaterally, normal work of respiration, no wheezes, rales, ronchi Abdomen:  soft, non-tender, non-distended, normal bowel sounds Msk: no joint edema, warmth, or erythema Extremities: 2+ DP/PT pulses bilaterally, no cyanosis, clubbing, or edema Neurologic: alert & oriented X3, cranial nerves II-XII intact, strength grossly 5/5 throughout, except for 4/5 hip flexion and extension with wincing, sensation intact to light touch, able to distinguish sharp from dull in both lower extremities  Current Outpatient Prescriptions on File Prior to Visit  Medication Sig Dispense Refill  . esomeprazole (NEXIUM) 40 MG capsule Take 40 mg by mouth daily before breakfast.      . fish oil-omega-3 fatty acids 1000 MG capsule Take by mouth daily.      . Flaxseed MISC by Does not apply route.      . fluticasone (FLONASE) 50 MCG/ACT nasal spray Place 1 spray into the nose daily.  16 g  2  . loratadine (CLARITIN) 10 MG tablet Take 10 mg by mouth as needed.      . Melatonin 3 MG CAPS Take by mouth.      . Multiple Vitamin (MULTIVITAMIN) capsule Take 1 capsule by mouth daily.      . polyethylene glycol (MIRALAX / GLYCOLAX) packet Take 17 g by mouth as needed.      . Psyllium (METAMUCIL PO) Take by mouth.      . vitamin B-12 (CYANOCOBALAMIN) 100 MCG tablet Take by mouth daily.        Assessment/Plan  **Low back pain: Ms. Skilton back pain is an acute process with an unclear etiology. It is possible that her back pain resulted from straining or overusing muscles when helping her mother out around the house.  It is also possible that the recent loss of her sister is playing a significant role in her back pain. Psychosocial risk factors play a significant role in lower back pain. For the vast majority of such patients, low back pain gradually resolves within four weeks. Chiropractors have been shown to be effective in reducing lower back pain in the acute setting, so we will refer her to a chiropractor when possible. She will need to call on the Nov 1st to help set up that referral since she is on the  [employer]. Physical therapy has been shown to be effective in the subacute lower back pain, so we may need to consider that in the future. The mainstay of therapy for her will include NSAIDs for symptomatic relief; we will start ibuprofen 200-400 mg/dose every 4-6 hours (maximum daily dose: 1.2 g), and acetaminophen 325-650 mg every 4-6 hours or 1000 mg 3-4 times daily (maximum: 4 g daily). We have encouraged her to remain active, while avoiding strenuous exercise. She has been encouraged to call us at 4 weeks if her symptoms persist.

## 2012-07-14 NOTE — Telephone Encounter (Signed)
Pt calls and c/o R lower back pain x 2 wks, she has tried otc's- patches, creams, aleve, ibuprofen, no help, becoming progressively worse, couldn't sleep last pm. Scheduled w/ med student for 1430 today

## 2012-07-14 NOTE — Telephone Encounter (Signed)
Thank you. Will see but might be a little late

## 2012-07-14 NOTE — Patient Instructions (Signed)
Call back on Friday to check up on chiropractor availability. Please take ibuprofen with food; 200-400 mg/dose every 4-6 hours (maximum daily dose: 1.2 g), and acetaminophen 325-650 mg every 4-6 hours or 1000 mg 3-4 times daily (maximum: 4 g daily). Your pain will most likely slowly decrease in severity over the next few weeks, however, please call us if your symptoms persist after 4 weeks time.

## 2012-07-16 ENCOUNTER — Encounter: Payer: Self-pay | Admitting: Internal Medicine
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
## 2012-07-16 NOTE — Progress Notes (Signed)
  Subjective:    Patient ID: Aimee Davis, female    DOB: May 24, 1956, 56 y.o.   MRN: [id number]  HPI  Please see the A&P for the status of the pt's chronic medical problems.  Review of Systems  Constitutional: Positive for activity change. Negative for fever and unexpected weight change.  Gastrointestinal: Positive for abdominal pain and abdominal distention.       No bowel / bladder incontinence   Musculoskeletal: Positive for back pain and gait problem. Negative for arthralgias.  Neurological: Positive for weakness.  Psychiatric/Behavioral: Positive for disturbed wake/sleep cycle.       Objective:   Physical Exam  Constitutional: She is oriented to person, place, and time. She appears well-developed and well-nourished. No distress.  HENT:  Head: Normocephalic and atraumatic.  Right Ear: External ear normal.  Left Ear: External ear normal.  Nose: Nose normal.  Eyes: Conjunctivae normal are normal.  Musculoskeletal: Normal range of motion. She exhibits no edema.       ROM R hip joint normal but increases pain. Trouble straightening up after hip flexion Gait favors R leg. Strength of R hip flexors / extensors limited by pain. Ankle dorsiflexion and extension nl Neg SLR on R No pain over R trochanteric bursa   Neurological: She is alert and oriented to person, place, and time.  Skin: Skin is warm and dry. No rash noted. No erythema.  Psychiatric: She has a normal mood and affect. Her behavior is normal. Judgment and thought content normal.          Assessment & Plan:

## 2012-07-16 NOTE — Assessment & Plan Note (Signed)
Reviewed past + TB and updated the EMR.

## 2012-07-16 NOTE — Assessment & Plan Note (Signed)
Has appt with [facility] MD soon. We reviewed the TTG results but explained that since she was already on GF diet could possibly be FP and is pre-test prob is high, she would need SB bx if not already done. Results printed off so that she could take to GI appt.

## 2012-10-10 ENCOUNTER — Ambulatory Visit (INDEPENDENT_AMBULATORY_CARE_PROVIDER_SITE_OTHER): Payer: No Typology Code available for payment source | Admitting: Internal Medicine

## 2012-10-10 ENCOUNTER — Encounter: Payer: Self-pay | Admitting: Internal Medicine

## 2012-10-10 VITALS — BP 100/62 | HR 65 | Temp 97.8°F | Ht 64.0 in | Wt 152.0 lb

## 2012-10-10 DIAGNOSIS — B20 Human immunodeficiency virus [HIV] disease: Secondary | ICD-10-CM

## 2012-10-10 DIAGNOSIS — R5383 Other fatigue: Secondary | ICD-10-CM

## 2012-10-10 DIAGNOSIS — R5381 Other malaise: Secondary | ICD-10-CM

## 2012-10-10 LAB — CBC
HCT: 37.3 % (ref 36.0–46.0)
MCH: 27.5 pg (ref 26.0–34.0)
MCV: 81.3 fL (ref 78.0–100.0)
Platelets: 217 10*3/uL (ref 150–400)
RBC: 4.59 MIL/uL (ref 3.87–5.11)
RDW: 13.4 % (ref 11.5–15.5)

## 2012-10-10 MED ORDER — FLUTICASONE-SALMETEROL 250-50 MCG/DOSE IN AEPB: 1.0000 | INHALATION_SPRAY | Freq: Two times a day (BID) | RESPIRATORY_TRACT | Status: DC

## 2012-10-10 NOTE — Patient Instructions (Signed)
We have seen you today and will call you on Monday with information about support group for smoking cessation. We will also check some lab tests and call you early next week with those results. Come back as scheduled with your regular doctor.   Insomnia Insomnia is frequent trouble falling and/or staying asleep. Insomnia can be a long term problem or a short term problem. Both are common. Insomnia can be a short term problem when the wakefulness is related to a certain stress or worry. Long term insomnia is often related to ongoing stress during waking hours and/or poor sleeping habits. Overtime, sleep deprivation itself can make the problem worse. Every little thing feels more severe because you are overtired and your ability to cope is decreased. CAUSES   Stress, anxiety, and depression.  Poor sleeping habits.  Distractions such as TV in the bedroom.  Naps close to bedtime.  Engaging in emotionally charged conversations before bed.  Technical reading before sleep.  Alcohol and other sedatives. They may make the problem worse. They can hurt normal sleep patterns and normal dream activity.  Stimulants such as caffeine for several hours prior to bedtime.  Pain syndromes and shortness of breath can cause insomnia.  Exercise late at night.  Changing time zones may cause sleeping problems (jet lag). It is sometimes helpful to have someone observe your sleeping patterns. They should look for periods of not breathing during the night (sleep apnea). They should also look to see how long those periods last. If you live alone or observers are uncertain, you can also be observed at a sleep clinic where your sleep patterns will be professionally monitored. Sleep apnea requires a checkup and treatment. Give your caregivers your medical history. Give your caregivers observations your family has made about your sleep.  SYMPTOMS   Not feeling rested in the morning.  Anxiety and restlessness at  bedtime.  Difficulty falling and staying asleep. TREATMENT   Your caregiver may prescribe treatment for an underlying medical disorders. Your caregiver can give advice or help if you are using alcohol or other drugs for self-medication. Treatment of underlying problems will usually eliminate insomnia problems.  Medications can be prescribed for short time use. They are generally not recommended for lengthy use.  Over-the-counter sleep medicines are not recommended for lengthy use. They can be habit forming.  You can promote easier sleeping by making lifestyle changes such as:  Using relaxation techniques that help with breathing and reduce muscle tension.  Exercising earlier in the day.  Changing your diet and the time of your last meal. No night time snacks.  Establish a regular time to go to bed.  Counseling can help with stressful problems and worry.  Soothing music and white noise may be helpful if there are background noises you cannot remove.  Stop tedious detailed work at least one hour before bedtime. HOME CARE INSTRUCTIONS   Keep a diary. Inform your caregiver about your progress. This includes any medication side effects. See your caregiver regularly. Take note of:  Times when you are asleep.  Times when you are awake during the night.  The quality of your sleep.  How you feel the next day. This information will help your caregiver care for you.  Get out of bed if you are still awake after 15 minutes. Read or do some quiet activity. Keep the lights down. Wait until you feel sleepy and go back to bed.  Keep regular sleeping and waking hours. Avoid naps.  Exercise  regularly.  Avoid distractions at bedtime. Distractions include watching television or engaging in any intense or detailed activity like attempting to balance the household checkbook.  Develop a bedtime ritual. Keep a familiar routine of bathing, brushing your teeth, climbing into bed at the same time  each night, listening to soothing music. Routines increase the success of falling to sleep faster.  Use relaxation techniques. This can be using breathing and muscle tension release routines. It can also include visualizing peaceful scenes. You can also help control troubling or intruding thoughts by keeping your mind occupied with boring or repetitive thoughts like the old concept of counting sheep. You can make it more creative like imagining planting one beautiful flower after another in your backyard garden.  During your day, work to eliminate stress. When this is not possible use some of the previous suggestions to help reduce the anxiety that accompanies stressful situations. MAKE SURE YOU:   Understand these instructions.  Will watch your condition.  Will get help right away if you are not doing well or get worse. Document Released: 08/31/2000 Document Revised: 11/26/2011 Document Reviewed: 10/01/2007 [facility] Patient Information 2013 [place], [place].

## 2012-10-12 NOTE — Progress Notes (Signed)
Subjective:     Patient ID: Aimee Davis, female   DOB: 08/19/1956, 57 y.o.   MRN: [id number]  HPI This is an acute visit for a 57 year old female comes in today for complaints of 1-2 weeks and fatigue. She does state that she quit smoking one to 2 weeks ago. She has also had a lot of stressors in her life including her sister dying back in September. She stated multiple other family members had died within the last year. It sounds as though this was the first holidays without them. She is not having any shortness of breath. No blood in her stools that she's noticed. No nausea, vomiting, diarrhea. She's not having chest pain or shortness of breath. She states that she is not sleeping well however never sleeps well. She states that she does still get enjoyment out of everyday activities that she likes to do. She states that she has been doing less activities that make her happy. She has not been exercising. She has not been losing weight. She did have flu shot this season  Review of Systems  Constitutional: Positive for activity change. Negative for fever, chills, diaphoresis, appetite change, fatigue and unexpected weight change.  HENT: Negative.   Eyes: Negative.   Respiratory: Negative for apnea, cough, choking, chest tightness, shortness of breath, wheezing and stridor.   Cardiovascular: Negative for chest pain, palpitations and leg swelling.  Gastrointestinal: Negative for nausea, vomiting, abdominal pain, diarrhea, constipation, blood in stool, abdominal distention, anal bleeding and rectal pain.  Musculoskeletal: Negative for myalgias, back pain, joint swelling, arthralgias and gait problem.  Skin: Negative for color change, pallor, rash and wound.  Neurological: Negative for dizziness, tremors, seizures, syncope, facial asymmetry, speech difficulty, weakness, light-headedness, numbness and headaches.  Hematological: Negative.   Psychiatric/Behavioral: Negative.        Objective:   Physical Exam  Constitutional: She is oriented to person, place, and time. She appears well-developed and well-nourished. No distress.  HENT:  Head: Normocephalic and atraumatic.  Eyes: Conjunctivae normal are normal. Pupils are equal, round, and reactive to light.  Neck: Normal range of motion. Neck supple. No thyromegaly present.  Cardiovascular: Normal rate and regular rhythm.   No murmur heard. Pulmonary/Chest: Effort normal and breath sounds normal. No respiratory distress. She has no wheezes. She has no rales. She exhibits no tenderness.  Abdominal: Soft. Bowel sounds are normal. She exhibits no distension. There is no tenderness.  Musculoskeletal: Normal range of motion. She exhibits no edema and no tenderness.  Neurological: She is alert and oriented to person, place, and time. No cranial nerve deficit.  Skin: Skin is warm and dry. No rash noted. No erythema. No pallor.  Psychiatric: Thought content normal.       Assessment:    1. Fatigue-the patient will be checked for low blood counts, hypothyroidism, HIV. Suspect strongly that this is related to cessation of smoking as well as a slight amount of underlying depression. Did feel that bupropion would be advisable for her as it would provide some help with the depression as well as the cravings for tobacco. She states that she called the 1 800 quit line and that they provided her with the nicotine patches. She would like to go to support group for people who are quitting smoking. My nurse will provide resources for her on Monday and call her back with this information. She declined bupropion at this time stating that she had taken it  before and had strange side effects.

## 2012-10-16 ENCOUNTER — Other Ambulatory Visit: Payer: Self-pay | Admitting: Internal Medicine

## 2012-10-16 DIAGNOSIS — Z1231 Encounter for screening mammogram for malignant neoplasm of breast: Secondary | ICD-10-CM

## 2012-10-23 ENCOUNTER — Ambulatory Visit: Payer: No Typology Code available for payment source

## 2012-10-27 ENCOUNTER — Ambulatory Visit (HOSPITAL_COMMUNITY)
Admission: RE | Admit: 2012-10-27 | Discharge: 2012-10-27 | Disposition: A | Discharge status: Home / Self Care | Payer: No Typology Code available for payment source | Source: Ambulatory Visit | Attending: Family Medicine | Admitting: Family Medicine

## 2012-10-27 DIAGNOSIS — Z1231 Encounter for screening mammogram for malignant neoplasm of breast: Secondary | ICD-10-CM | POA: Insufficient documentation

## 2012-10-31 ENCOUNTER — Ambulatory Visit (HOSPITAL_COMMUNITY): Payer: Self-pay

## 2012-11-04 ENCOUNTER — Encounter: Payer: Self-pay | Admitting: Internal Medicine

## 2012-11-04 ENCOUNTER — Ambulatory Visit (INDEPENDENT_AMBULATORY_CARE_PROVIDER_SITE_OTHER): Payer: No Typology Code available for payment source | Admitting: Internal Medicine

## 2012-11-04 VITALS — BP 92/61 | HR 80 | Temp 97.6°F | Resp 20 | Ht 64.0 in | Wt 146.3 lb

## 2012-11-04 DIAGNOSIS — K589 Irritable bowel syndrome without diarrhea: Secondary | ICD-10-CM

## 2012-11-04 DIAGNOSIS — Z0000 Encounter for general adult medical examination without abnormal findings: Secondary | ICD-10-CM

## 2012-11-04 DIAGNOSIS — Z79899 Other long term (current) drug therapy: Secondary | ICD-10-CM

## 2012-11-04 DIAGNOSIS — J449 Chronic obstructive pulmonary disease, unspecified: Secondary | ICD-10-CM

## 2012-11-04 DIAGNOSIS — K5900 Constipation, unspecified: Secondary | ICD-10-CM

## 2012-11-04 DIAGNOSIS — J4489 Other specified chronic obstructive pulmonary disease: Secondary | ICD-10-CM

## 2012-11-04 DIAGNOSIS — F17200 Nicotine dependence, unspecified, uncomplicated: Secondary | ICD-10-CM

## 2012-11-04 MED ORDER — NICOTINE 14 MG/24HR TD PT24: 1.0000 | MEDICATED_PATCH | TRANSDERMAL | Status: DC

## 2012-11-04 MED ORDER — DOCUSATE SODIUM 100 MG PO CAPS: 100.0000 mg | ORAL_CAPSULE | Freq: Every day | ORAL | Status: DC | PRN

## 2012-11-04 NOTE — Patient Instructions (Addendum)
Please follow up for your colonoscopy and bring in your records   Please drink lots of water and eat regularly  PLEASE STOP SMOKING, if you need prescriptions or assistance call clinic [phone] call 1800QUITNOW  Continue your current medications

## 2012-11-04 NOTE — Assessment & Plan Note (Addendum)
  Assessment:  Progress toward smoking cessation:  smoking more  Barriers to progress toward smoking cessation:  withdrawal symptoms;stress  Comments: recent family death, lag between nicotine patch refills  Plan:  Instruction/counseling given:  I counseled patient on the dangers of tobacco use, advised patient to stop smoking and reviewed strategies to maximize success.  Educational resources provided:  [facility] (1-800-QUIT-NOW) brochure;referral to smoking cessation class  Self management tools provided:  smoking cessation plan ([facility] Quit Plan)  Medications to assist with smoking cessation:  Nicotine Patch Patient agreed to the following self-care plans for smoking cessation:   Back to smoking approximately 6 cigs/day.  Attempted to quit multiple times in the past and did well on nicotine patches however, when there was delay in the next shipment in the mail she re-started.  She is serious about quitting and would like to try again.    -referral again to 1800quit now -prescribed nicotine patches -social support information given -extensive counseling given  -will continue to follow up

## 2012-11-04 NOTE — Progress Notes (Signed)
Subjective:   Patient ID: Aimee Davis female   DOB: [birth year] 57 y.o.   MRN: [id number]  HPI: [name] is a 57 y.o. pleasant African American female with PMH of IBS and asthma.  She is followed at [facility] and will get colonoscopy soon.  She is here for routine follow up.  Claims to be doing okay but still sad after her sister's recent death in June 20, 2012.  Otherwise, she denies any chest pain, N/V/D, fever, chills, shortness of breath, headaches , or any urinary complaints at this time.  She relapsed with smoking again, and would like to try patches again to try to quit.   Past Medical History  Diagnosis Date  . Allergy     seasonal, PCN, antidepressants, bentyl  . Asthma   . Carpal tunnel syndrome on both sides     right worse than left 2008  . Hyperlipidemia     LDL 108 JULY 2013  . Positive TB test     From MArch 2009 note : Recent F/u at [facility]. CXRAy negative.  Positive TST in 2007 (14mm).  Unable to tolerate INH due to hepatotoxicity   Current Outpatient Prescriptions  Medication Sig Dispense Refill  . cholecalciferol (VITAMIN D) 1000 UNITS tablet Take 2,000 Units by mouth daily.      . fish oil-omega-3 fatty acids 1000 MG capsule Take by mouth daily.      . Flaxseed MISC by Does not apply route.      . fluticasone (FLONASE) 50 MCG/ACT nasal spray Place 1 spray into the nose daily.  16 g  2  . Fluticasone-Salmeterol (ADVAIR DISKUS) 250-50 MCG/DOSE AEPB Inhale 1 puff into the lungs 2 (two) times daily.  1 each  3  . loratadine (CLARITIN) 10 MG tablet Take 10 mg by mouth as needed.      . Multiple Vitamin (MULTIVITAMIN) capsule Take 1 capsule by mouth daily.      . Psyllium (METAMUCIL PO) Take by mouth.      . vitamin B-12 (CYANOCOBALAMIN) 100 MCG tablet Take by mouth daily.      Marland Kitchen docusate sodium (COLACE) 100 MG capsule Take 1 capsule (100 mg total) by mouth daily as needed for constipation.  30 capsule  1  . esomeprazole (NEXIUM) 40 MG capsule Take 40 mg  by mouth daily before breakfast.      . Melatonin 3 MG CAPS Take by mouth.      . nicotine (NICODERM CQ - DOSED IN MG/24 HOURS) 14 mg/24hr patch Place 1 patch onto the skin daily.  30 patch  0  . polyethylene glycol (MIRALAX / GLYCOLAX) packet Take 17 g by mouth as needed.       No current facility-administered medications for this visit.   Family History  Problem Relation Age of Onset  . Hypertension Mother   . Hyperlipidemia Mother   . Heart disease Mother   . Diabetes Mother   . Kidney disease Mother   . Stroke Father   . Diabetes Father   . Heart disease Father   . Hyperlipidemia Father   . Hypertension Father   . Kidney disease Father   . Cancer Sister   . Mental illness Sister   . Diabetes Sister   . Kidney disease Sister   . Hypertension Sister   . Mental illness Brother   . Asthma Son   . Hypertension Maternal Aunt   . Hypertension Maternal Uncle   . Hypertension Paternal Aunt   .  Kidney disease Paternal Aunt   . Hypertension Paternal Uncle   . Kidney disease Paternal Uncle    History   Social History  . Marital Status: Divorced    Spouse Name: N/A    Number of Children: N/A  . Years of Education: N/A   Social History Main Topics  . Smoking status: Current Every Day Smoker -- 0.33 packs/day    Types: Cigarettes  . Smokeless tobacco: Not on file     Comment: Started back recently d/t death in family. 4-5 CIG/DAY  . Alcohol Use: No     Comment: QUIT 19 YEARS AGO--HEAVY USE  . Drug Use: No  . Sexually Active: No     Comment: S/P PARTIAL HYSTERECTOMY   Other Topics Concern  . Not on file   Social History Narrative  . No narrative on file   Review of Systems: Constitutional: occasional fatigue. Denies fever, chills, diaphoresis, appetite change.  HEENT: Denies photophobia, eye pain, redness, hearing loss, ear pain, congestion, sore throat, rhinorrhea, sneezing, mouth sores, trouble swallowing, neck pain, neck stiffness and tinnitus.   Respiratory:  occasional productive cough.  Denies SOB, DOE, chest tightness,  and wheezing.   Cardiovascular: Denies chest pain, palpitations and leg swelling.  Gastrointestinal: Constipation, vague abdominal pain. Denies nausea, vomiting,diarrhea, blood in stool and abdominal distention.  Genitourinary: Denies dysuria, urgency, frequency, hematuria, flank pain and difficulty urinating.  Musculoskeletal: Denies myalgias, back pain, joint swelling, arthralgias and gait problem.  Skin: Denies pallor, rash and wound.  Neurological: Denies dizziness, seizures, syncope, weakness, light-headedness, numbness and headaches.  Hematological: Denies adenopathy. Easy bruising, personal or family bleeding history  Psychiatric/Behavioral: sadness after sister's recent death.  Denies suicidal ideation, mood changes, confusion, nervousness, sleep disturbance and agitation.    Objective:  Physical Exam: Filed Vitals:   11/04/12 1405 11/04/12 1408  BP: 92/59 92/61  Pulse: 77 80  Temp: 97.6 F (36.4 C)   TempSrc: Oral   Resp: 20   Height: 5\' 4"  (1.626 m)   Weight: 146 lb 4.8 oz (66.361 kg)   SpO2: 98%    Constitutional: Vital signs reviewed.  Patient is a well-developed and well-nourished female in no acute distress and cooperative with exam. Alert and oriented x3.  Head: Normocephalic and atraumatic Ear: TM normal bilaterally Mouth: no erythema or exudates, MMM Eyes: PERRLA, EOMI, conjunctivae normal, No scleral icterus.  Neck: Supple Cardiovascular: RRR, S1 normal, S2 normal, no MRG, pulses symmetric and intact bilaterally Pulmonary/Chest: CTAB, no wheezes, rales, or rhonchi Abdominal: Soft. Slight tenderness to deep palpation of epigastric region.  non-distended, bowel sounds are normal, no masses, organomegaly, or guarding present.  GU: no CVA tenderness Musculoskeletal: No joint deformities, erythema, or stiffness, ROM full and no nontender Hematology: no cervical, inginal, or axillary adenopathy.   Neurological: A&O x3, Strength is normal and symmetric bilaterally, cranial nerve II-XII are grossly intact, no focal motor deficit, sensory intact to light touch bilaterally.  Skin: Warm, dry and intact. No rash, cyanosis, or clubbing.  Psychiatric: Normal mood and affect. speech and behavior is normal. Judgment and thought content normal. Cognition and memory are normal. sad after sister's death.    Assessment & Plan:  Discussed with Dr. Belinda Block  Smoking cessation--1800quit now, will try nicotine patches again IBS--follows at [facility] Dr. Chestine Spore, need records and is scheduled to get colonoscopy in upcoming weeks.

## 2012-11-04 NOTE — Assessment & Plan Note (Signed)
Using over the counter agents but claims stool is hard.  -will try adding colace to regimen.

## 2012-11-04 NOTE — Assessment & Plan Note (Signed)
Follows with [facility]--Dr. Chestine Spore for GI issues.  Claims was told not Celiac and discontinued gluten free diet, now following high fiber.  Scheduled for colonoscopy up coming weeks, will start bowel prep before.    -asked to obtain records from [facility] -follow up colonoscopy -continue follow up with Dr. Chestine Spore

## 2012-11-04 NOTE — Assessment & Plan Note (Signed)
Still smoking.  Occasional productive cough and shortness of breath but stable at this time.  Improves when she stops smoking.  -discussed smoking cessation again, willing to quit, will try patches again

## 2012-11-05 LAB — COMPLETE METABOLIC PANEL WITH GFR
AST: 23 U/L (ref 0–37)
Albumin: 4.4 g/dL (ref 3.5–5.2)
BUN: 14 mg/dL (ref 6–23)
Calcium: 10.3 mg/dL (ref 8.4–10.5)
Chloride: 105 mEq/L (ref 96–112)
Creat: 0.9 mg/dL (ref 0.50–1.10)
GFR, Est Non African American: 71 mL/min
Glucose, Bld: 81 mg/dL (ref 70–99)

## 2012-11-05 LAB — VITAMIN D 25 HYDROXY (VIT D DEFICIENCY, FRACTURES): Vit D, 25-Hydroxy: 41 ng/mL (ref 30–89)

## 2012-11-08 LAB — VITAMIN D 1,25 DIHYDROXY: Vitamin D 1, 25 (OH)2 Total: 40 pg/mL (ref 18–72)

## 2012-11-20 ENCOUNTER — Other Ambulatory Visit: Payer: Self-pay | Admitting: Internal Medicine

## 2012-11-20 MED ORDER — ALBUTEROL SULFATE HFA 108 (90 BASE) MCG/ACT IN AERS: 2.0000 | INHALATION_SPRAY | Freq: Four times a day (QID) | RESPIRATORY_TRACT | Status: DC | PRN

## 2012-12-04 ENCOUNTER — Encounter: Payer: Self-pay | Admitting: Internal Medicine

## 2012-12-04 ENCOUNTER — Ambulatory Visit (INDEPENDENT_AMBULATORY_CARE_PROVIDER_SITE_OTHER): Payer: No Typology Code available for payment source | Admitting: Internal Medicine

## 2012-12-04 VITALS — BP 101/64 | HR 70 | Temp 96.2°F | Ht 64.0 in | Wt 147.5 lb

## 2012-12-04 DIAGNOSIS — N390 Urinary tract infection, site not specified: Secondary | ICD-10-CM | POA: Insufficient documentation

## 2012-12-04 DIAGNOSIS — R300 Dysuria: Secondary | ICD-10-CM

## 2012-12-04 DIAGNOSIS — F17200 Nicotine dependence, unspecified, uncomplicated: Secondary | ICD-10-CM

## 2012-12-04 LAB — POCT URINALYSIS DIPSTICK
Bilirubin, UA: NEGATIVE
Ketones, UA: NEGATIVE
Protein, UA: NEGATIVE
Spec Grav, UA: 1.025
pH, UA: 5.5

## 2012-12-04 MED ORDER — NITROFURANTOIN MONOHYD MACRO 100 MG PO CAPS: 100.0000 mg | ORAL_CAPSULE | Freq: Two times a day (BID) | ORAL | Status: AC

## 2012-12-04 NOTE — Progress Notes (Signed)
Subjective:   Patient ID: Aimee Davis female   DOB: 11-07-1955 57 y.o.   MRN: [id number]  HPI: Aimee Davis is a 57 y.o. African American female with PMH of chronic tobacco abuse presenting to clinic today for an acute visit with complaints of burning with urination x3-4 days and yesterday noticed light pink streak on her toilet paper while wiping.  She does not recall the last time she had a UTI and claims to have been drinking water and cranberry juice lately.  She also reports using soap to wash her self thoroughly in vaginal area and wonders if that may have caused it.  She denies any recent sexual intercourse or douching.  She has mild frequency as well.    Otherwise, she denies any back pain, chest pain, N/V/D, fever, chills, or constipation at this time.  She has chronic unchanged abdominal pain.    She continues to smoke cigarettes but is seriously considering quitting, maybe tomorrow since she now has her patches at home and is looking into smoking cessation support groups.    Past Medical History  Diagnosis Date  . Allergy     seasonal, PCN, antidepressants, bentyl  . Asthma   . Carpal tunnel syndrome on both sides     right worse than left 2008  . Hyperlipidemia     LDL 108 JULY 2013  . Positive TB test     From MArch 2009 note : Recent F/u at [facility] CXRAy negative.  Positive TST in 2007 (14mm).  Unable to tolerate INH due to hepatotoxicity   Current Outpatient Prescriptions  Medication Sig Dispense Refill  . albuterol (PROVENTIL HFA;VENTOLIN HFA) 108 (90 BASE) MCG/ACT inhaler Inhale 2 puffs into the lungs every 6 (six) hours as needed for wheezing.  1 Inhaler  2  . cholecalciferol (VITAMIN D) 1000 UNITS tablet Take 2,000 Units by mouth daily.      Marland Kitchen esomeprazole (NEXIUM) 40 MG capsule Take 40 mg by mouth daily before breakfast.      . Flaxseed MISC by Does not apply route.      . Fluticasone-Salmeterol (ADVAIR DISKUS) 250-50 MCG/DOSE AEPB Inhale 1 puff into the  lungs 2 (two) times daily.  1 each  3  . loratadine (CLARITIN) 10 MG tablet Take 10 mg by mouth as needed.      . Multiple Vitamin (MULTIVITAMIN) capsule Take 1 capsule by mouth daily.      . vitamin B-12 (CYANOCOBALAMIN) 100 MCG tablet Take by mouth daily.      Marland Kitchen docusate sodium (COLACE) 100 MG capsule Take 1 capsule (100 mg total) by mouth daily as needed for constipation.  30 capsule  1  . fish oil-omega-3 fatty acids 1000 MG capsule Take by mouth daily.      . fluticasone (FLONASE) 50 MCG/ACT nasal spray Place 1 spray into the nose daily.  16 g  2  . Melatonin 3 MG CAPS Take by mouth.      . nicotine (NICODERM CQ - DOSED IN MG/24 HOURS) 14 mg/24hr patch Place 1 patch onto the skin daily.  30 patch  0  . nitrofurantoin, macrocrystal-monohydrate, (MACROBID) 100 MG capsule Take 1 capsule (100 mg total) by mouth 2 (two) times daily. For 7 days  14 capsule  0  . polyethylene glycol (MIRALAX / GLYCOLAX) packet Take 17 g by mouth as needed.      . Psyllium (METAMUCIL PO) Take by mouth.       No current facility-administered medications for  this visit.   Family History  Problem Relation Age of Onset  . Hypertension Mother   . Hyperlipidemia Mother   . Heart disease Mother   . Diabetes Mother   . Kidney disease Mother   . Stroke Father   . Diabetes Father   . Heart disease Father   . Hyperlipidemia Father   . Hypertension Father   . Kidney disease Father   . Cancer Sister   . Mental illness Sister   . Diabetes Sister   . Kidney disease Sister   . Hypertension Sister   . Mental illness Brother   . Asthma Son   . Hypertension Maternal Aunt   . Hypertension Maternal Uncle   . Hypertension Paternal Aunt   . Kidney disease Paternal Aunt   . Hypertension Paternal Uncle   . Kidney disease Paternal Uncle    History   Social History  . Marital Status: Divorced    Spouse Name: N/A    Number of Children: N/A  . Years of Education: N/A   Social History Main Topics  . Smoking status:  Current Every Day Smoker -- 0.33 packs/day    Types: Cigarettes  . Smokeless tobacco: None     Comment: 6 cigs/day.  . Alcohol Use: No     Comment: QUIT 19 YEARS AGO--HEAVY USE  . Drug Use: No  . Sexually Active: Not Currently    Birth Control/ Protection: None     Comment: S/P PARTIAL HYSTERECTOMY   Other Topics Concern  . None   Social History Narrative  . None   Review of Systems: Constitutional: Denies fever, chills, diaphoresis, appetite change and fatigue.  HEENT: Denies photophobia, eye pain, redness, hearing loss, ear pain, congestion, sore throat, rhinorrhea, sneezing, mouth sores, trouble swallowing, neck pain, neck stiffness and tinnitus.   Respiratory: Denies SOB, DOE, cough, chest tightness,  and wheezing.   Cardiovascular: Denies chest pain, palpitations and leg swelling.  Gastrointestinal: Denies nausea, vomiting, abdominal pain, diarrhea, constipation, blood in stool and abdominal distention.  Genitourinary: dysuria, frequency, hematuria.  Denies flank pain, urgency, and difficulty urinating.  Musculoskeletal: Denies myalgias, back pain, joint swelling, arthralgias and gait problem.  Skin: Denies pallor, rash and wound.  Neurological: Denies dizziness, seizures, syncope, weakness, light-headedness, numbness and headaches.  Hematological: Denies adenopathy. Easy bruising, personal or family bleeding history  Psychiatric/Behavioral: Denies suicidal ideation, mood changes, confusion, nervousness, sleep disturbance and agitation  Objective:  Physical Exam: Filed Vitals:   12/04/12 0919  BP: 101/64  Pulse: 70  Temp: 96.2 F (35.7 C)  TempSrc: Oral  Height: 5\' 4"  (1.626 m)  Weight: 147 lb 8 oz (66.906 kg)  SpO2: 99%   Constitutional: Vital signs reviewed.  Patient is a well-developed and well-nourished female in no acute distress and cooperative with exam. Alert and oriented x3.  Head: Normocephalic and atraumatic Ear: TM normal bilaterally Mouth: no erythema  or exudates, MMM Eyes: PERRL, EOMI, conjunctivae normal, No scleral icterus.  Neck: Supple, Trachea midline normal ROM  Cardiovascular: RRR, S1 normal, S2 normal, no MRG, pulses symmetric and intact bilaterally Pulmonary/Chest: CTAB, no wheezes, rales, or rhonchi Abdominal: Soft. Tender to deep palpation of lower abdomen, non-distended, bowel sounds are normal, no masses, or guarding present.  GU: no CVA tenderness Musculoskeletal: No joint deformities, erythema, or stiffness, ROM full and nontender Hematology: no cervical, inginal, or axillary adenopathy.  Neurological: A&O x3, Strength is normal and symmetric bilaterally, cranial nerve II-XII are grossly intact, no focal motor deficit, sensory intact to  light touch bilaterally.  Skin: Warm, dry and intact. No rash, cyanosis, or clubbing.  Psychiatric: Normal mood and affect. speech and behavior is normal. Judgment and thought content normal. Cognition and memory are normal.   Assessment & Plan:  Discussed with Dr. Dalphine Handing  UTI--macrobid 100mg  po bid x7 days

## 2012-12-04 NOTE — Assessment & Plan Note (Addendum)
Burning with urination 3-4 days and noticing light pink color on wiping yesterday.  Has been drinking more water and cranberry juice.    Likely secondary to UTI  -marcrobid 100mg  po BID x7 days--faxed to [employer] department -drink plenty of water -stop using soap to wash in vaginal area, use water -always wipe front to back -cotton undergarments

## 2012-12-04 NOTE — Assessment & Plan Note (Signed)
  Assessment:  Progress toward smoking cessation:  smoking the same amount  Barriers to progress toward smoking cessation:  withdrawal symptoms  Comments: ~6 cigs a day, triggered to smoke by stress  Plan:  Instruction/counseling given:  I counseled patient on the dangers of tobacco use, advised patient to stop smoking and reviewed strategies to maximize success.  Educational resources provided:  [facility] (1-800-QUIT-NOW) brochure;smoking cessation resource list;smoking cessation handout (tips, strategies, fact sheets)  Self management tools provided:     Medications to assist with smoking cessation:  Nicotine Patch Patient agreed to the following self-care plans for smoking cessation:  call [facility] (1-800-QUIT-NOW);go to the [employer] (www.quitlinenc.com);see a smoking cessation counselor;attend a smoking cessation class;set a quit date and stop smoking   Other: tentative quit date for tomorrow 12/05/12.  She has patches at home.  Given lots of resources today.  Recommend a support group to assist as well.

## 2012-12-04 NOTE — Patient Instructions (Signed)
General Instructions: We will treat your for a UTI today with macrobid 100mg  by mouth twice daily for 7 days  If your symptoms do not improve: burning with urination, notice blood, fever, chills, abdominal pain, back pain, call the clinic [phone]  Please stop smoking--we can try to set your quit date for tomorrow 12/05/12 using the patches you have.  Call 1800quitnow for more assistance and consider going to smoking cessation classes.    Treatment Goals:  Goals (1 Years of Data) as of 12/04/12         11/04/12     Lifestyle    . Quit smoking / using tobacco  No     Progress Toward Treatment Goals:  Treatment Goal 12/04/2012  Stop smoking smoking the same amount   Self Care Goals & Plans:  Self Care Goal 12/04/2012  Manage my medications -  Be physically active -  Stop smoking call [facility] (1-800-QUIT-NOW); go to the [employer] ([employer]); see a smoking cessation counselor; attend a smoking cessation class; set a quit date and stop smoking   Care Management & Community Referrals:  Referral 12/04/2012  Referrals made for care management support smoking cessation counselor  Referrals made to community resources smoking cessation     Nitrofurantoin tablets or capsules What is this medicine? NITROFURANTOIN (nye troe fyoor AN toyn) is an antibiotic. It is used to treat urinary tract infections. This medicine may be used for other purposes; ask your health care provider or pharmacist if you have questions. What should I tell my health care provider before I take this medicine? They need to know if you have any of these conditions: -anemia -diabetes -glucose-6-phosphate dehydrogenase deficiency -kidney disease -liver disease -lung disease -other chronic illness -an unusual or allergic reaction to nitrofurantoin, other antibiotics, other medicines, foods, dyes or preservatives -pregnant or trying to get pregnant -breast-feeding How should I use this  medicine? Take this medicine by mouth with a glass of water. Follow the directions on the prescription label. Take this medicine with food or milk. Take your doses at regular intervals. Do not take your medicine more often than directed. Do not stop taking except on your doctor's advice. Talk to your pediatrician regarding the use of this medicine in children. While this drug may be prescribed for selected conditions, precautions do apply. Overdosage: If you think you have taken too much of this medicine contact a poison control center or emergency room at once. NOTE: This medicine is only for you. Do not share this medicine with others. What if I miss a dose? If you miss a dose, take it as soon as you can. If it is almost time for your next dose, take only that dose. Do not take double or extra doses. What may interact with this medicine? -antacids containing magnesium trisilicate -probenecid -quinolone antibiotics like ciprofloxacin, lomefloxacin, norfloxacin and ofloxacin -sulfinpyrazone This list may not describe all possible interactions. Give your health care provider a list of all the medicines, herbs, non-prescription drugs, or dietary supplements you use. Also tell them if you smoke, drink alcohol, or use illegal drugs. Some items may interact with your medicine. What should I watch for while using this medicine? Tell your doctor or health care professional if your symptoms do not improve or if you get new symptoms. Drink several glasses of water a day. If you are taking this medicine for a long time, visit your doctor for regular checks on your progress. If you are diabetic, you may get a  false positive result for sugar in your urine with certain brands of urine tests. Check with your doctor. What side effects may I notice from receiving this medicine? Side effects that you should report to your doctor or health care professional as soon as possible: -allergic reactions like skin rash or  hives, swelling of the face, lips, or tongue -chest pain -cough -difficulty breathing -dizziness, drowsiness -fever or infection -joint aches or pains -pale or blue-tinted skin -redness, blistering, peeling or loosening of the skin, including inside the mouth -tingling, burning, pain, or numbness in hands or feet -unusual bleeding or bruising -unusually weak or tired -yellowing of eyes or skin Side effects that usually do not require medical attention (report to your doctor or health care professional if they continue or are bothersome): -dark urine -diarrhea -headache -loss of appetite -nausea or vomiting -temporary hair loss This list may not describe all possible side effects. Call your doctor for medical advice about side effects. You may report side effects to FDA at 1-800-FDA-1088. Where should I keep my medicine? Keep out of the reach of children. Store at room temperature between 15 and 30 degrees C (59 and 86 degrees F). Protect from light. Throw away any unused medicine after the expiration date. NOTE: This sheet is a summary. It may not cover all possible information. If you have questions about this medicine, talk to your doctor, pharmacist, or health care provider.  2013, [employer]vier/Gold Standard. (03/24/2008 3:56:47 PM)  Urinary Tract Infection Urinary tract infections (UTIs) can develop anywhere along your urinary tract. Your urinary tract is your body's drainage system for removing wastes and extra water. Your urinary tract includes two kidneys, two ureters, a bladder, and a urethra. Your kidneys are a pair of bean-shaped organs. Each kidney is about the size of your fist. They are located below your ribs, one on each side of your spine. CAUSES Infections are caused by microbes, which are microscopic organisms, including fungi, viruses, and bacteria. These organisms are so small that they can only be seen through a microscope. Bacteria are the microbes that most commonly  cause UTIs. SYMPTOMS  Symptoms of UTIs may vary by age and gender of the patient and by the location of the infection. Symptoms in young women typically include a frequent and intense urge to urinate and a painful, burning feeling in the bladder or urethra during urination. Older women and men are more likely to be tired, shaky, and weak and have muscle aches and abdominal pain. A fever may mean the infection is in your kidneys. Other symptoms of a kidney infection include pain in your back or sides below the ribs, nausea, and vomiting. DIAGNOSIS To diagnose a UTI, your caregiver will ask you about your symptoms. Your caregiver also will ask to provide a urine sample. The urine sample will be tested for bacteria and white blood cells. White blood cells are made by your body to help fight infection. TREATMENT  Typically, UTIs can be treated with medication. Because most UTIs are caused by a bacterial infection, they usually can be treated with the use of antibiotics. The choice of antibiotic and length of treatment depend on your symptoms and the type of bacteria causing your infection. HOME CARE INSTRUCTIONS  If you were prescribed antibiotics, take them exactly as your caregiver instructs you. Finish the medication even if you feel better after you have only taken some of the medication.  Drink enough water and fluids to keep your urine clear or pale yellow.  Avoid caffeine, tea, and carbonated beverages. They tend to irritate your bladder.  Empty your bladder often. Avoid holding urine for long periods of time.  Empty your bladder before and after sexual intercourse.  After a bowel movement, women should cleanse from front to back. Use each tissue only once. SEEK MEDICAL CARE IF:   You have back pain.  You develop a fever.  Your symptoms do not begin to resolve within 3 days. SEEK IMMEDIATE MEDICAL CARE IF:   You have severe back pain or lower abdominal pain.  You develop  chills.  You have nausea or vomiting.  You have continued burning or discomfort with urination. MAKE SURE YOU:   Understand these instructions.  Will watch your condition.  Will get help right away if you are not doing well or get worse. Document Released: 06/13/2005 Document Revised: 03/04/2012 Document Reviewed: 10/12/2011 [facility] Patient Information 2013 [place], [place].

## 2012-12-11 ENCOUNTER — Encounter: Payer: Self-pay | Admitting: Internal Medicine

## 2012-12-11 ENCOUNTER — Ambulatory Visit (INDEPENDENT_AMBULATORY_CARE_PROVIDER_SITE_OTHER): Payer: No Typology Code available for payment source | Admitting: Internal Medicine

## 2012-12-11 VITALS — BP 118/69 | HR 68 | Temp 98.0°F | Ht 64.0 in | Wt 147.9 lb

## 2012-12-11 DIAGNOSIS — N390 Urinary tract infection, site not specified: Secondary | ICD-10-CM

## 2012-12-11 MED ORDER — SULFAMETHOXAZOLE-TRIMETHOPRIM 800-160 MG PO TABS: 1.0000 | ORAL_TABLET | Freq: Two times a day (BID) | ORAL | Status: AC

## 2012-12-11 NOTE — Assessment & Plan Note (Signed)
Still w persistent, mainly unchanged symptoms. No longer w gross hematuria. Dip shows persistent trace leuks, no nitrite or blood. She seems not to have responded to macrobid, also complaining of HA/myalgias which may be drug SE. No systemic evidence of infection, afebrile, no flank pain. Will try bactrim 1 DS BID x 3 days and send urine for micro and culture.  Educated on [place] of bactrim, and to RTC if sx due not improve.  D/w Dr Dalphine Handing

## 2012-12-11 NOTE — Progress Notes (Signed)
Patient ID: Aimee Davis, female   DOB: 1956/06/03, 57 y.o.   MRN: [id number]  Subjective:   Patient ID: Aimee Davis female   DOB: 1956-01-11 57 y.o.   MRN: [id number]  HPI: Aimee Davis is a 57 y.o. African American female with PMH of chronic tobacco abuse presenting to clinic today for follow up of dysuria.  Was seen by Dr. Virgina Organ 12/04/12 w 4 days symptoms of dysuria and increased frequency. No h/o recurrent UTIs. Dipstick showed trace blood and leuks. Treated by Dr. Virgina Organ with Macrobid BID x7 days.  Reports that she filled her meds and has taken 6/7 days. Describes persistent dysuria, urge, and frequency (8times/day) . No gross hematuria. No back pain/fever/chills/dizziness. Some suprapubic discomfort w urinating. NO vaginal itching/pain/discharge/odor.  Says when she started taking macrobid she had a headache and some muscle pains.  Also complains of sore throat, mild, over the past day or 2.  Denies fever, chills, nasal congestion, productive cough.      Past Medical History  Diagnosis Date  . Allergy     seasonal, PCN, antidepressants, bentyl  . Asthma   . Carpal tunnel syndrome on both sides     right worse than left 2008  . Hyperlipidemia     LDL 108 JULY 2013  . Positive TB test     From [facility] 2009 note : Recent F/u at [facility]. CXRAy negative.  Positive TST in 2007 (14mm).  Unable to tolerate INH due to hepatotoxicity   Current Outpatient Prescriptions  Medication Sig Dispense Refill  . albuterol (PROVENTIL HFA;VENTOLIN HFA) 108 (90 BASE) MCG/ACT inhaler Inhale 2 puffs into the lungs every 6 (six) hours as needed for wheezing.  1 Inhaler  2  . cholecalciferol (VITAMIN D) 1000 UNITS tablet Take 2,000 Units by mouth daily.      Marland Kitchen docusate sodium (COLACE) 100 MG capsule Take 1 capsule (100 mg total) by mouth daily as needed for constipation.  30 capsule  1  . esomeprazole (NEXIUM) 40 MG capsule Take 40 mg by mouth daily before breakfast.      . fish oil-omega-3  fatty acids 1000 MG capsule Take by mouth daily.      . Flaxseed MISC by Does not apply route.      . fluticasone (FLONASE) 50 MCG/ACT nasal spray Place 1 spray into the nose daily.  16 g  2  . Fluticasone-Salmeterol (ADVAIR DISKUS) 250-50 MCG/DOSE AEPB Inhale 1 puff into the lungs 2 (two) times daily.  1 each  3  . loratadine (CLARITIN) 10 MG tablet Take 10 mg by mouth as needed.      . Melatonin 3 MG CAPS Take by mouth.      . Multiple Vitamin (MULTIVITAMIN) capsule Take 1 capsule by mouth daily.      . nicotine (NICODERM CQ - DOSED IN MG/24 HOURS) 14 mg/24hr patch Place 1 patch onto the skin daily.  30 patch  0  . polyethylene glycol (MIRALAX / GLYCOLAX) packet Take 17 g by mouth as needed.      . Psyllium (METAMUCIL PO) Take by mouth.      . sulfamethoxazole-trimethoprim (BACTRIM DS) 800-160 MG per tablet Take 1 tablet by mouth 2 (two) times daily.  6 tablet  0  . vitamin B-12 (CYANOCOBALAMIN) 100 MCG tablet Take by mouth daily.       No current facility-administered medications for this visit.   Family History  Problem Relation Age of Onset  . Hypertension Mother   .  Hyperlipidemia Mother   . Heart disease Mother   . Diabetes Mother   . Kidney disease Mother   . Stroke Father   . Diabetes Father   . Heart disease Father   . Hyperlipidemia Father   . Hypertension Father   . Kidney disease Father   . Cancer Sister   . Mental illness Sister   . Diabetes Sister   . Kidney disease Sister   . Hypertension Sister   . Mental illness Brother   . Asthma Son   . Hypertension Maternal Aunt   . Hypertension Maternal Uncle   . Hypertension Paternal Aunt   . Kidney disease Paternal Aunt   . Hypertension Paternal Uncle   . Kidney disease Paternal Uncle    History   Social History  . Marital Status: Divorced    Spouse Name: N/A    Number of Children: N/A  . Years of Education: N/A   Social History Main Topics  . Smoking status: Current Every Day Smoker -- 0.33 packs/day     Types: Cigarettes  . Smokeless tobacco: None     Comment: 6 cigs/day.  . Alcohol Use: No     Comment: QUIT 19 YEARS AGO--HEAVY USE  . Drug Use: No  . Sexually Active: Not Currently    Birth Control/ Protection: None     Comment: S/P PARTIAL HYSTERECTOMY   Other Topics Concern  . None   Social History Narrative  . None   Review of Systems: 10 pt ROS performed, pertinent positives and negatives noted in HPI Objective:  Physical Exam: Filed Vitals:   12/11/12 1403  BP: 118/69  Pulse: 68  Temp: 98 F (36.7 C)  TempSrc: Oral  Height: 5\' 4"  (1.626 m)  Weight: 147 lb 14.4 oz (67.087 kg)  SpO2: 100%   Vitals reviewed. General: sitting in chair, NAD HEENT: PERRL, EOMI, no scleral icterus. MMM of OP with no exudate. No LAD of neck.  Cardiac: RRR, no rubs, murmurs or gallops Pulm: clear to auscultation bilaterally, no wheezes, rales, or rhonchi Abd: soft, some suprapubic TTP Ext: warm and well perfused, no pedal edema Neuro: alert and oriented X3, cranial nerves II-XII grossly intact, strength and sensation to light touch equal in bilateral upper and lower extremities  Assessment & Plan:   Please see problem-based charting for assessment and plan.

## 2012-12-11 NOTE — Patient Instructions (Addendum)
1. Take a new antibiotic, Bactrim, one tablet twice a day for 3 days.  2. If your symptoms are not getting better in 3 days, call us. 3. I will send a culture of your urine and call you with the results.    Sulfamethoxazole; Trimethoprim, SMX-TMP tablets What is this medicine? SULFAMETHOXAZOLE; TRIMETHOPRIM or SMX-TMP (suhl fuh meth OK suh zohl; trye METH oh prim) is a combination of a sulfonamide antibiotic and a second antibiotic, trimethoprim. It is used to treat or prevent certain kinds of bacterial infections. It will not work for colds, flu, or other viral infections. This medicine may be used for other purposes; ask your health care provider or pharmacist if you have questions. What should I tell my health care provider before I take this medicine? They need to know if you have any of these conditions: -anemia -asthma -being treated with anticonvulsants -if you frequently drink alcohol containing drinks -kidney disease -liver disease -low level of folic acid or [id number]-4-[id number] dehydrogenase -poor nutrition or malabsorption -porphyria -severe allergies -thyroid disorder -an unusual or allergic reaction to sulfamethoxazole, trimethoprim, sulfa drugs, other medicines, foods, dyes, or preservatives -pregnant or trying to get pregnant -breast-feeding How should I use this medicine? Take this medicine by mouth with a full glass of water. Follow the directions on the prescription label. Take your medicine at regular intervals. Do not take it more often than directed. Do not skip doses or stop your medicine early. Talk to your pediatrician regarding the use of this medicine in children. Special care may be needed. This medicine has been used in children as young as 44 months of age. Overdosage: If you think you have taken too much of this medicine contact a poison control center or emergency room at once. NOTE: This medicine is only for you. Do not share this medicine with  others. What if I miss a dose? If you miss a dose, take it as soon as you can. If it is almost time for your next dose, take only that dose. Do not take double or extra doses. What may interact with this medicine? Do not take this medicine with any of the following medications: -aminobenzoate potassium -dofetilide -metronidazole This medicine may also interact with the following medications: -ACE inhibitors like benazepril, enalapril, lisinopril, and ramipril -cyclosporine -digoxin -diuretics -indomethacin -medicines for diabetes -methenamine -methotrexate -phenytoin -potassium supplements -pyrimethamine -sulfinpyrazone -tricyclic antidepressants -warfarin This list may not describe all possible interactions. Give your health care provider a list of all the medicines, herbs, non-prescription drugs, or dietary supplements you use. Also tell them if you smoke, drink alcohol, or use illegal drugs. Some items may interact with your medicine. What should I watch for while using this medicine? Tell your doctor or health care professional if your symptoms do not improve. Drink several glasses of water a day to reduce the risk of kidney problems. Do not treat diarrhea with over the counter products. Contact your doctor if you have diarrhea that lasts more than 2 days or if it is severe and watery. This medicine can make you more sensitive to the sun. Keep out of the sun. If you cannot avoid being in the sun, wear protective clothing and use a sunscreen. Do not use sun lamps or tanning beds/booths. What side effects may I notice from receiving this medicine? Side effects that you should report to your doctor or health care professional as soon as possible: -allergic reactions like skin rash or hives, swelling of the face, lips, or  tongue -breathing problems -fever or chills, sore throat -irregular heartbeat, chest pain -joint or muscle pain -pain or difficulty passing urine -red pinpoint  spots on skin -redness, blistering, peeling or loosening of the skin, including inside the mouth -unusual bleeding or bruising -unusually weak or tired -yellowing of the eyes or skin Side effects that usually do not require medical attention (report to your doctor or health care professional if they continue or are bothersome): -diarrhea -dizziness -headache -loss of appetite -nausea, vomiting -nervousness This list may not describe all possible side effects. Call your doctor for medical advice about side effects. You may report side effects to FDA at 1-800-FDA-1088. Where should I keep my medicine? Keep out of the reach of children. Store at room temperature between 20 to 25 degrees C (68 to 77 degrees F). Protect from light. Throw away any unused medicine after the expiration date. NOTE: This sheet is a summary. It may not cover all possible information. If you have questions about this medicine, talk to your doctor, pharmacist, or health care provider.  2013, Elsevier/Gold Standard. (05/05/2008 11:32:51 AM)   Urinary Tract Infection Urinary tract infections (UTIs) can develop anywhere along your urinary tract. Your urinary tract is your body's drainage system for removing wastes and extra water. Your urinary tract includes two kidneys, two ureters, a bladder, and a urethra. Your kidneys are a pair of bean-shaped organs. Each kidney is about the size of your fist. They are located below your ribs, one on each side of your spine. CAUSES Infections are caused by microbes, which are microscopic organisms, including fungi, viruses, and bacteria. These organisms are so small that they can only be seen through a microscope. Bacteria are the microbes that most commonly cause UTIs. SYMPTOMS  Symptoms of UTIs may vary by age and gender of the patient and by the location of the infection. Symptoms in young women typically include a frequent and intense urge to urinate and a painful, burning feeling  in the bladder or urethra during urination. Older women and men are more likely to be tired, shaky, and weak and have muscle aches and abdominal pain. A fever may mean the infection is in your kidneys. Other symptoms of a kidney infection include pain in your back or sides below the ribs, nausea, and vomiting. DIAGNOSIS To diagnose a UTI, your caregiver will ask you about your symptoms. Your caregiver also will ask to provide a urine sample. The urine sample will be tested for bacteria and white blood cells. White blood cells are made by your body to help fight infection. TREATMENT  Typically, UTIs can be treated with medication. Because most UTIs are caused by a bacterial infection, they usually can be treated with the use of antibiotics. The choice of antibiotic and length of treatment depend on your symptoms and the type of bacteria causing your infection. HOME CARE INSTRUCTIONS  If you were prescribed antibiotics, take them exactly as your caregiver instructs you. Finish the medication even if you feel better after you have only taken some of the medication.  Drink enough water and fluids to keep your urine clear or pale yellow.  Avoid caffeine, tea, and carbonated beverages. They tend to irritate your bladder.  Empty your bladder often. Avoid holding urine for long periods of time.  Empty your bladder before and after sexual intercourse.  After a bowel movement, women should cleanse from front to back. Use each tissue only once. SEEK MEDICAL CARE IF:   You have back pain.  You develop a fever.  Your symptoms do not begin to resolve within 3 days. SEEK IMMEDIATE MEDICAL CARE IF:   You have severe back pain or lower abdominal pain.  You develop chills.  You have nausea or vomiting.  You have continued burning or discomfort with urination. MAKE SURE YOU:   Understand these instructions.  Will watch your condition.  Will get help right away if you are not doing well or get  worse. Document Released: 06/13/2005 Document Revised: 03/04/2012 Document Reviewed: 10/12/2011 [facility] Patient Information 2013 [place], [place].

## 2012-12-12 LAB — URINALYSIS, ROUTINE W REFLEX MICROSCOPIC
Bilirubin Urine: NEGATIVE
Glucose, UA: NEGATIVE mg/dL
Hgb urine dipstick: NEGATIVE
Ketones, ur: NEGATIVE mg/dL
Nitrite: NEGATIVE
Specific Gravity, Urine: 1.007 (ref 1.005–1.030)
pH: 6 (ref 5.0–8.0)

## 2012-12-12 LAB — URINALYSIS, MICROSCOPIC ONLY
Casts: NONE SEEN
Crystals: NONE SEEN
Squamous Epithelial / LPF: NONE SEEN

## 2012-12-12 LAB — URINE CULTURE: Organism ID, Bacteria: NO GROWTH

## 2013-01-08 ENCOUNTER — Other Ambulatory Visit: Payer: Self-pay | Admitting: Internal Medicine

## 2013-01-08 ENCOUNTER — Telehealth: Payer: Self-pay | Admitting: *Deleted

## 2013-01-08 ENCOUNTER — Other Ambulatory Visit: Payer: Self-pay | Admitting: *Deleted

## 2013-01-08 MED ORDER — FLUTICASONE-SALMETEROL 250-50 MCG/DOSE IN AEPB: 1.0000 | INHALATION_SPRAY | Freq: Two times a day (BID) | RESPIRATORY_TRACT | Status: DC

## 2013-01-08 MED ORDER — ESOMEPRAZOLE MAGNESIUM 40 MG PO CPDR: 40.0000 mg | DELAYED_RELEASE_CAPSULE | Freq: Every day | ORAL | Status: DC

## 2013-01-08 MED ORDER — FLUTICASONE PROPIONATE 50 MCG/ACT NA SUSP: 1.0000 | Freq: Every day | NASAL | Status: DC

## 2013-01-08 NOTE — Telephone Encounter (Signed)
Rx called in 

## 2013-01-08 NOTE — Telephone Encounter (Signed)
Also pt is asking for eye drops for dry eyes.  Not on med list. See is signed up with [facility]

## 2013-01-08 NOTE — Telephone Encounter (Signed)
Advair, Nexium, and Flonase rxs called to [facility]; pt informed.

## 2013-02-18 ENCOUNTER — Telehealth: Payer: Self-pay | Admitting: *Deleted

## 2013-02-18 NOTE — Telephone Encounter (Signed)
Fax from [facility] - Flonase Nasal Spray is no longer available thru pt assistance. But Nasonex Nasal Spray 2 sprays each nostril daily is Available. If u prefer to change medication, need new rx.  Thanks

## 2013-02-19 NOTE — Telephone Encounter (Signed)
Will leave for Dr. Virgina Organ to decide when she returns.

## 2013-02-23 ENCOUNTER — Telehealth: Payer: Self-pay | Admitting: Internal Medicine

## 2013-02-23 NOTE — Telephone Encounter (Signed)
Dear Aimee Davis,  Does Aimee Davis still need this and does she even still use it? If so, we can make the switch. I tried calling her this evening but no answer and mail box is full.  Can we try again tomorrow please and see if she answers?  Thanks,  Dr. Jannet Mantis

## 2013-02-23 NOTE — Telephone Encounter (Signed)
Attempted to call Ms. Speciale in regards to her flonase prescription but no answer and mailbox is full.  Will try again at a later time.

## 2013-02-24 NOTE — Telephone Encounter (Signed)
Pt states she does not need Flonase at this time.

## 2013-03-17 ENCOUNTER — Encounter: Payer: Self-pay | Admitting: Internal Medicine

## 2013-03-17 ENCOUNTER — Ambulatory Visit (INDEPENDENT_AMBULATORY_CARE_PROVIDER_SITE_OTHER): Payer: No Typology Code available for payment source | Admitting: Internal Medicine

## 2013-03-17 ENCOUNTER — Ambulatory Visit (HOSPITAL_COMMUNITY)
Admission: RE | Admit: 2013-03-17 | Discharge: 2013-03-17 | Disposition: A | Discharge status: Home / Self Care | Payer: No Typology Code available for payment source | Source: Ambulatory Visit | Attending: Internal Medicine | Admitting: Internal Medicine

## 2013-03-17 VITALS — BP 102/62 | HR 63 | Temp 97.8°F | Ht 64.0 in | Wt 152.2 lb

## 2013-03-17 DIAGNOSIS — M222X9 Patellofemoral disorders, unspecified knee: Secondary | ICD-10-CM | POA: Insufficient documentation

## 2013-03-17 DIAGNOSIS — M25569 Pain in unspecified knee: Secondary | ICD-10-CM

## 2013-03-17 DIAGNOSIS — M25469 Effusion, unspecified knee: Secondary | ICD-10-CM | POA: Insufficient documentation

## 2013-03-17 DIAGNOSIS — M25561 Pain in right knee: Secondary | ICD-10-CM

## 2013-03-17 NOTE — Patient Instructions (Addendum)
Try over the counter naproxen for pain relief  Ice your right knee up to 4 times a day for relief, especially at night    Rest your knee as much as possible  Try knee brace for support  Get your xray's done  Continue nexium, especially while taking naproxen  Let us know if the pain worsens or does not improve, [id number]  Patellofemoral Syndrome If you have had pain in the front of your knee for a long time, chances are good that you have patellofemoral syndrome. The word patella refers to the kneecap. Femoral (or femur) refers to the thigh bone. That is the bone the kneecap sits on. The kneecap is shaped like a triangle. Its job is to protect the knee and to improve the efficiency of your thigh muscles (quadriceps). The underside of the kneecap is made of smooth tissue (cartilage). This lets the kneecap slide up and down as the knee moves. Sometimes this cartilage becomes soft. Your healthcare provider may say the cartilage breaks down. That is patellofemoral syndrome. It can affect one knee, or both. The condition is sometimes called patellofemoral pain syndrome. That is because the condition is painful. The pain usually gets worse with activity. Sitting for a long time with the knee bent also makes the pain worse. It usually gets better with rest and proper treatment. CAUSES  No one is sure why some people develop this problem and others do not. Runners often get it. One name for the condition is "runner's knee." However, some people run for years and never have knee pain. Certain things seem to make patellofemoral syndrome more likely. They include:  Moving out of alignment. The kneecap is supposed to move in a straight line when the thigh muscle pulls on it. Sometimes the kneecap moves in poor alignment. That can make the knee swell and hurt. Some experts believe it also wears down the cartilage.  Injury to the kneecap.  Strain on the knee. This may occur during sports activity. Soccer,  running, skiing and cycling can put excess stress on the knee.  Being flat-footed or knock-kneed. SYMPTOMS   Knee pain.  Pain under the kneecap. This is usually a dull, aching pain.  Pain in the knee when doing certain things: squatting, kneeling, going up or down stairs.  Pain in the knee when you stand up after sitting down for awhile.  Tightness in the knee.  Loss of muscle strength in the thigh.  Swelling of the knee. DIAGNOSIS  Healthcare providers often send people with knee pain to an orthopedic caregiver. This person has special training to treat problems with bones and joints. To decide what is causing your knee pain, your caregiver will probably:  Do a physical exam. This will probably include:  Asking about symptoms you have noticed.  Asking about your activities and any injuries.  Feeling your knee. Moving it. This will help test the knee's strength. It will also check alignment (whether the knee and leg are aligned normally).  Order some tests, such as:  Imaging tests. They create pictures of the inside of the knee. Tests may include:  X-rays.  Computed tomography (CT) scan. This uses X-rays and a computer to show more detail.  Magnetic resonance imaging (MRI). This test uses magnets, radio waves and a computer to make pictures. TREATMENT   Medication is almost always used first. It can relieve pain. It also can reduce swelling. Non-steroidal anti-inflammatory medicines (called NSAIDs) are usually suggested. Sometimes a stronger form is  needed. A stronger form would require a prescription.  Other treatment may be needed after the swelling goes down. Possibilities include:  Exercise. Certain exercises can make the muscles around the knee stronger which decreases the pressure on the knee cap. This includes the thigh muscle. Certain exercises also may be suggested to increase your flexibility.  A knee brace. This gives the knee extra support and helps align the  movement of the knee cap.  Orthotics. These are special shoe inserts. They can help keep your leg and knee aligned.  Surgery is sometimes needed. This is rare. Options include:  Arthroscopy. The surgeon uses a special tool to remove any damaged pieces of the kneecap. Only a few small incisions (cuts) are needed.  Realignment. This is open surgery. The goals are to reduce pressure and fix the way the kneecap moves. HOME CARE INSTRUCTIONS   Take any medication prescribed by your healthcare provider. Follow the directions carefully.  If your knee is swollen:  Put ice or cold packs on it. Do this for 20 to 30 minutes, 3 to 4 times a day.  Keep the knee raised. Make sure it is supported. Put a pillow under it.  Rest your knee. For example, take the elevator instead of the stairs for awhile. Or, take a break from sports activity that strain your knee. Try walking or swimming instead.  Whenever you are active:  Use an elastic bandage on your knee. This gives it support.  After any activity, put ice or cold packs on your knees. Do this for about 10 to 20 minutes.  Make sure you wear shoes that give good support. Make sure they are not worn down. The heels should not slant in or out. SEEK MEDICAL CARE IF:   Knee pain gets worse. Or it does not go away, even after taking pain medicine.  Swelling does not go down.  Your thigh muscle becomes weak.  You have an oral temperature above 102 F (38.9 C). SEEK IMMEDIATE MEDICAL CARE IF:  You have an oral temperature above 102 F (38.9 C), not controlled by medicine. Document Released: 08/22/2009 Document Revised: 11/26/2011 Document Reviewed: 08/22/2009 [facility] Patient Information 2014 [place], [place].  Patellofemoral Pain Your exam shows your knee pain is probably due to a problem with the knee cap, the patella. This problem is also called patellofemoral pain, runner's knee, or chondromalacia. Most of the time, this problem is due to  overuse of the knee joint. Repeated bending and straightening can irritate the underside of the knee cap. When this happens, activities such as running, walking, climbing, biking or jumping usually produce pain. Pain may also occur after prolonged sitting. Other patellofemoral symptoms can include joint stiffness, swelling, and a snapping or grinding sensation with movement. Rest and rehabilitation are usually successful in treating this problem. Surgery is rarely needed. Treatment includes correcting any mechanical factors that could hurt the normal working of the knee. This could be weak thigh muscles or foot problems. Avoid repetitive activities of the knee until the pain and other symptoms improve. Apply ice packs over the knee for 20 to 30 minutes every 2 to 4 hours to reduce pain and swelling. Only take over-the-counter or prescription medicines for pain, discomfort, or fever as directed by your caregiver. Knee braces or neoprene sleeves may help reduce irritation. Rehabilitation exercises to strengthen the quad muscle are often prescribed when your symptoms are better. Call your caregiver for a follow-up exam to evaluate your response to treatment. Document Released:  10/11/2004 Document Revised: 11/26/2011 Document Reviewed: 09/03/2005 [employer] Patient Information 2014 [place], [place].
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
## 2013-03-23 NOTE — Progress Notes (Signed)
Case discussed with Dr. Qureshi at the time of the visit.  We reviewed the resident's history and exam and pertinent patient test results.  I agree with the assessment, diagnosis, and plan of care documented in the resident's note. 

## 2013-03-30 ENCOUNTER — Telehealth: Payer: Self-pay | Admitting: *Deleted

## 2013-03-30 NOTE — Telephone Encounter (Signed)
Pt called was lifting 5 lb water container and pulled back over the weekend.  Using heat and taking Ibuprofen. Checking on referral concerning knees. Appt with Dr Virgina Organ 03/31/13 2:15PM - pt requested 03/31/13. Stanton Kidney Michaeal Davis RN 03/30/13 11:45AM

## 2013-03-31 ENCOUNTER — Encounter: Payer: Self-pay | Admitting: Internal Medicine

## 2013-03-31 ENCOUNTER — Ambulatory Visit (INDEPENDENT_AMBULATORY_CARE_PROVIDER_SITE_OTHER): Payer: No Typology Code available for payment source | Admitting: Internal Medicine

## 2013-03-31 VITALS — BP 107/66 | HR 62 | Temp 97.7°F | Ht 64.0 in | Wt 153.2 lb

## 2013-03-31 DIAGNOSIS — M549 Dorsalgia, unspecified: Secondary | ICD-10-CM

## 2013-03-31 DIAGNOSIS — G8929 Other chronic pain: Secondary | ICD-10-CM

## 2013-03-31 MED ORDER — ACETAMINOPHEN 325 MG PO TABS: 325.0000 mg | ORAL_TABLET | Freq: Four times a day (QID) | ORAL | Status: DC | PRN

## 2013-03-31 MED ORDER — IBUPROFEN 400 MG PO TABS: 400.0000 mg | ORAL_TABLET | Freq: Four times a day (QID) | ORAL | Status: DC | PRN

## 2013-03-31 NOTE — Assessment & Plan Note (Signed)
Acute on chronic back pain at this time, possible lumbago secondary to lifting 5 lb heavy object over the weekend.  Mild improvement since Saturday morning with ibuprofen 400mg  bid and heat to affected area.  No tenderness to spin, no focal neurological deficits, no fever, chills, or unexpected weight loss.  Reports success in the past with chiropractor and PT.   Will hold on imaging at this time but will proceed with PT referral given right knee pain and chronic back pain.  Supportive measures for back pain at this time with ice to affected area, trying to continue daily activities as much as possible but avoiding extreme physical exertion, will try alternating ibuprofen 400mg  q6h and acetaminophen 325-650mg  q6h prn pain to see if that helps with pain.  If no improvement in 1 week, asked to call or return to clinic.  Counseled on alarm symptoms to watch out for including neurological deficits, numbness, weakness, tingling, incontinence of bowel or bladder, fever, or chills, unstable gait, or worsening of pain.

## 2013-03-31 NOTE — Progress Notes (Signed)
Subjective:   Patient ID: Aimee Davis female   DOB: 1956-07-17 57 y.o.   MRN: [id number]  HPI: AimeeAimee Davis is a 57 y.o. African American female with PMH of chronic tobacco abuse, IBS, and asthma presenting to clinic today for an acute visit with complaints of acute back pain secondary to lifting 5lb water container on Friday afternoon.  she did not have immediate pain at that time but noticed pain on Saturday morning when she tried to get out of bed. She has trued using heat and taking ibuprofen for the pain (400mg  po bid) for the pain which has helped a little.  Currently, the pain is 7-8/10 and worse with movement especially bending down forward, but she says the pain has improved a little.  When she bends, she feels a pulling sensation in the middle of her lower back that radiates across.  Aimee Davis does suffer from chronic back pain at baseline and has had some success with PT in the past and also went to a chiropractor last year and she says she thinks the chiropractor told her she has some sort of slipped disc.  She does not remember the name of the physician.  She denies any numbness and tingling in her legs, no shooting pain down her legs, no bowel or bladder incontinence, no history of cancer or other trauma, and no diabetes or known immunosuppression.  She also denies any fever, chills, or N/V/D.    She continues to have right knee pain and has been unable to purchase a brace as she is concerned about the cost at this time.   Past Medical History  Diagnosis Date  . Allergy     seasonal, PCN, antidepressants, bentyl  . Asthma   . Carpal tunnel syndrome on both sides     right worse than left 2008  . Hyperlipidemia     LDL 108 JULY 2013  . Positive TB test     From [facility] 2009 note : Recent F/u at [facility]. CXRAy negative.  Positive TST in 2007 (14mm).  Unable to tolerate INH due to hepatotoxicity   Current Outpatient Prescriptions  Medication Sig Dispense Refill  . albuterol  (PROVENTIL HFA;VENTOLIN HFA) 108 (90 BASE) MCG/ACT inhaler Inhale 2 puffs into the lungs every 6 (six) hours as needed for wheezing.  1 Inhaler  2  . cholecalciferol (VITAMIN D) 1000 UNITS tablet Take 2,000 Units by mouth daily.      Marland Kitchen docusate sodium (COLACE) 100 MG capsule Take 1 capsule (100 mg total) by mouth daily as needed for constipation.  30 capsule  1  . esomeprazole (NEXIUM) 40 MG capsule Take 1 capsule (40 mg total) by mouth daily before breakfast.  30 capsule  3  . fish oil-omega-3 fatty acids 1000 MG capsule Take by mouth daily.      . Flaxseed MISC by Does not apply route.      . fluticasone (FLONASE) 50 MCG/ACT nasal spray Place 1 spray into the nose daily.  16 g  2  . Fluticasone-Salmeterol (ADVAIR DISKUS) 250-50 MCG/DOSE AEPB Inhale 1 puff into the lungs 2 (two) times daily.  1 each  3  . loratadine (CLARITIN) 10 MG tablet Take 10 mg by mouth as needed.      . Melatonin 3 MG CAPS Take by mouth.      . Multiple Vitamin (MULTIVITAMIN) capsule Take 1 capsule by mouth daily.      . nicotine (NICODERM CQ - DOSED IN MG/24  HOURS) 14 mg/24hr patch Place 1 patch onto the skin daily.  30 patch  0  . polyethylene glycol (MIRALAX / GLYCOLAX) packet Take 17 g by mouth as needed.      . Psyllium (METAMUCIL PO) Take by mouth.      . vitamin B-12 (CYANOCOBALAMIN) 100 MCG tablet Take by mouth daily.       No current facility-administered medications for this visit.   Family History  Problem Relation Age of Onset  . Hypertension Mother   . Hyperlipidemia Mother   . Heart disease Mother   . Diabetes Mother   . Kidney disease Mother   . Stroke Father   . Diabetes Father   . Heart disease Father   . Hyperlipidemia Father   . Hypertension Father   . Kidney disease Father   . Cancer Sister   . Mental illness Sister   . Diabetes Sister   . Kidney disease Sister   . Hypertension Sister   . Mental illness Brother   . Asthma Son   . Hypertension Maternal Aunt   . Hypertension Maternal  Uncle   . Hypertension Paternal Aunt   . Kidney disease Paternal Aunt   . Hypertension Paternal Uncle   . Kidney disease Paternal Uncle    History   Social History  . Marital Status: Divorced    Spouse Name: N/A    Number of Children: N/A  . Years of Education: N/A   Social History Main Topics  . Smoking status: Former Smoker -- 0.33 packs/day    Types: Cigarettes    Quit date: 01/06/2013  . Smokeless tobacco: Not on file     Comment: 6 cigs/day.  . Alcohol Use: No     Comment: QUIT 19 YEARS AGO--HEAVY USE  . Drug Use: No  . Sexually Active: Not Currently    Birth Control/ Protection: None     Comment: S/P PARTIAL HYSTERECTOMY   Other Topics Concern  . Not on file   Social History Narrative  . No narrative on file   Review of Systems:  Constitutional:  Denies fever, chills, diaphoresis, appetite change and fatigue.   HEENT:  Denies congestion, sore throat, rhinorrhea, sneezing, mouth sores, trouble swallowing, neck pain   Respiratory:  Denies SOB, DOE, cough, and wheezing.   Cardiovascular:  Denies palpitations and leg swelling.   Gastrointestinal:  Occasional abdominal cramping.  Denies nausea, vomiting, diarrhea, constipation, blood in stool and abdominal distention.   Genitourinary:  Denies dysuria, urgency, frequency, hematuria, flank pain and difficulty urinating.   Musculoskeletal:  Back pain and right knee pain.  Denies joint swelling, arthralgias and gait problem.   Skin:  Denies pallor, rash and wound.   Neurological:  Denies dizziness, seizures, syncope, weakness, light-headedness, numbness and headaches.    Objective:  Physical Exam: Filed Vitals:   03/31/13 1441  BP: 107/66  Pulse: 62  Temp: 97.7 F (36.5 C)  TempSrc: Oral  Height: 5\' 4"  (1.626 m)  Weight: 153 lb 3.2 oz (69.491 kg)  SpO2: 97%   Vitals reviewed. General: sitting in chair, NAD HEENT: PERRL, EOMI, no scleral icterus Cardiac: RRR, no rubs, murmurs or gallops Pulm: clear to  auscultation bilaterally, no wheezes, rales, or rhonchi Abd: soft, nontender, nondistended, BS present Ext: warm and well perfused, no pedal edema, +2DP B/L MSK: no tenderness to palpation of lower spine  But pain elicited on flexion and extension, more when trying to touch toes.  Pain in lower back when raising both legs.  Neuro: alert and oriented X3, cranial nerves II-XII grossly intact, strength and sensation to light touch equal in bilateral upper and lower extremities  Assessment & Plan:  Discussed with Dr. Criselda Peaches ?Lumabgo: conservative treatment at this time: ice to area, ibuprofen and tylenol alternating q6h prn pain, PT (hx of knee and back pain).

## 2013-03-31 NOTE — Patient Instructions (Addendum)
General Instructions:  We will refer you to physical therapy today for your back and knee pain  Consider applying ice to your lower back at last 4 times a day as tolerated for approximately 15 minutes at a time to see if you have any relief.  Try alternating between ibuprofen and and tylenol every 4 to 6 hours as needed for pain  Try to continue with your daily activities as much as possible. This pain should take at least 1 week to improve, however, if you notice no improvement in 2 weeks call and let us know.    Also, please monitor for worsening of pain, fever, chills, numbness and tingling in your extremities, weakness, and unstable gait, and bowel or urinary incontinence, as these may be suggestive of more serious injury and will need further work up.   Try to purchase knee brace if possible and affordable  Treatment Goals:  Goals (1 Years of Data) as of 03/31/13         12/04/12 11/04/12     Lifestyle    . Quit smoking / using tobacco  No No      Progress Toward Treatment Goals:  Treatment Goal 12/04/2012  Stop smoking smoking the same amount    Self Care Goals & Plans:  Self Care Goal 12/11/2012  Manage my medications take my medicines as prescribed; bring my medications to every visit; refill my medications on time  Be physically active take a walk every day  Stop smoking (No Data)   Care Management & Community Referrals:  Referral 03/31/2013  Referrals made for care management support -  Referrals made to community resources exercise/physical therapy    Lumbosacral Strain Lumbosacral strain is one of the most common causes of back pain. There are many causes of back pain. Most are not serious conditions. CAUSES  Your backbone (spinal column) is made up of 24 main vertebral bodies, the sacrum, and the coccyx. These are held together by muscles and tough, fibrous tissue (ligaments). Nerve roots pass through the openings between the vertebrae. A sudden move or injury to  the back may cause injury to, or pressure on, these nerves. This may result in localized back pain or pain movement (radiation) into the buttocks, down the leg, and into the foot. Sharp, shooting pain from the buttock down the back of the leg (sciatica) is frequently associated with a ruptured (herniated) disk. Pain may be caused by muscle spasm alone. Your caregiver can often find the cause of your pain by the details of your symptoms and an exam. In some cases, you may need tests (such as X-rays). Your caregiver will work with you to decide if any tests are needed based on your specific exam. HOME CARE INSTRUCTIONS   Avoid an underactive lifestyle. Active exercise, as directed by your caregiver, is your greatest weapon against back pain.  Avoid hard physical activities (tennis, racquetball, waterskiing) if you are not in proper physical condition for it. This may aggravate or create problems.  If you have a back problem, avoid sports requiring sudden body movements. Swimming and walking are generally safer activities.  Maintain good posture.  Avoid becoming overweight (obese).  Use bed rest for only the most extreme, sudden (acute) episode. Your caregiver will help you determine how much bed rest is necessary.  For acute conditions, you may put ice on the injured area.  Put ice in a plastic bag.  Place a towel between your skin and the bag.  Leave the  ice on for 15-20 minutes at a time, every 2 hours, or as needed.  After you are improved and more active, it may help to apply heat for 30 minutes before activities. See your caregiver if you are having pain that lasts longer than expected. Your caregiver can advise appropriate exercises or therapy if needed. With conditioning, most back problems can be avoided. SEEK IMMEDIATE MEDICAL CARE IF:   You have numbness, tingling, weakness, or problems with the use of your arms or legs.  You experience severe back pain not relieved with  medicines.  There is a change in bowel or bladder control.  You have increasing pain in any area of the body, including your belly (abdomen).  You notice shortness of breath, dizziness, or feel faint.  You feel sick to your stomach (nauseous), are throwing up (vomiting), or become sweaty.  You notice discoloration of your toes or legs, or your feet get very cold.  Your back pain is getting worse.  You have a fever. MAKE SURE YOU:   Understand these instructions.  Will watch your condition.  Will get help right away if you are not doing well or get worse. Document Released: 06/13/2005 Document Revised: 11/26/2011 Document Reviewed: 12/03/2008 [facility] Patient Information 2014 [place], [place].  Back Pain, Adult Back pain is very common. The pain often gets better over time. The cause of back pain is usually not dangerous. Most people can learn to manage their back pain on their own.  HOME CARE   Stay active. Start with short walks on flat ground if you can. Try to walk farther each day.  Do not sit, drive, or stand in one place for more than 30 minutes. Do not stay in bed.  Do not avoid exercise or work. Activity can help your back heal faster.  Be careful when you bend or lift an object. Bend at your knees, keep the object close to you, and do not twist.  Sleep on a firm mattress. Lie on your side, and bend your knees. If you lie on your back, put a pillow under your knees.  Only take medicines as told by your doctor.  Put ice on the injured area.  Put ice in a plastic bag.  Place a towel between your skin and the bag.  Leave the ice on for 15-20 minutes, 3-4 times a day for the first 2 to 3 days. After that, you can switch between ice and heat packs.  Ask your doctor about back exercises or massage.  Avoid feeling anxious or stressed. Find good ways to deal with stress, such as exercise. GET HELP RIGHT AWAY IF:   Your pain does not go away with rest or  medicine.  Your pain does not go away in 1 week.  You have new problems.  You do not feel well.  The pain spreads into your legs.  You cannot control when you poop (bowel movement) or pee (urinate).  Your arms or legs feel weak or lose feeling (numbness).  You feel sick to your stomach (nauseous) or throw up (vomit).  You have belly (abdominal) pain.  You feel like you may pass out (faint). MAKE SURE YOU:   Understand these instructions.  Will watch your condition.  Will get help right away if you are not doing well or get worse. Document Released: 02/20/2008 Document Revised: 11/26/2011 Document Reviewed: 01/22/2011 [facility] Patient Information 2014 [place], [place].

## 2013-04-02 NOTE — Progress Notes (Signed)
Case discussed with Dr. Qureshi at the time of the visit.  We reviewed the resident's history and exam and pertinent patient test results.  I agree with the assessment, diagnosis, and plan of care documented in the resident's note. 

## 2013-04-07 ENCOUNTER — Ambulatory Visit: Payer: No Typology Code available for payment source | Attending: Internal Medicine

## 2013-04-07 DIAGNOSIS — M545 Low back pain, unspecified: Secondary | ICD-10-CM | POA: Insufficient documentation

## 2013-04-07 DIAGNOSIS — IMO0001 Reserved for inherently not codable concepts without codable children: Secondary | ICD-10-CM | POA: Insufficient documentation

## 2013-04-07 DIAGNOSIS — M25569 Pain in unspecified knee: Secondary | ICD-10-CM | POA: Insufficient documentation

## 2013-04-09 ENCOUNTER — Ambulatory Visit: Payer: No Typology Code available for payment source | Admitting: Rehabilitation

## 2013-04-14 ENCOUNTER — Ambulatory Visit: Payer: No Typology Code available for payment source | Admitting: Rehabilitation

## 2013-04-17 ENCOUNTER — Ambulatory Visit: Payer: No Typology Code available for payment source | Attending: Internal Medicine | Admitting: Rehabilitation

## 2013-04-17 DIAGNOSIS — M545 Low back pain, unspecified: Secondary | ICD-10-CM | POA: Insufficient documentation

## 2013-04-17 DIAGNOSIS — IMO0001 Reserved for inherently not codable concepts without codable children: Secondary | ICD-10-CM | POA: Insufficient documentation

## 2013-04-17 DIAGNOSIS — M25569 Pain in unspecified knee: Secondary | ICD-10-CM | POA: Insufficient documentation

## 2013-04-21 ENCOUNTER — Ambulatory Visit: Payer: No Typology Code available for payment source | Admitting: Rehabilitation

## 2013-04-24 ENCOUNTER — Ambulatory Visit: Payer: No Typology Code available for payment source

## 2013-04-27 ENCOUNTER — Ambulatory Visit: Payer: Self-pay

## 2013-04-27 ENCOUNTER — Ambulatory Visit: Payer: No Typology Code available for payment source

## 2013-04-27 ENCOUNTER — Other Ambulatory Visit: Payer: Self-pay | Admitting: *Deleted

## 2013-04-27 MED ORDER — ALBUTEROL SULFATE HFA 108 (90 BASE) MCG/ACT IN AERS: 2.0000 | INHALATION_SPRAY | Freq: Four times a day (QID) | RESPIRATORY_TRACT | Status: DC | PRN

## 2013-04-27 NOTE — Telephone Encounter (Signed)
Rx faxed in.

## 2013-05-01 ENCOUNTER — Ambulatory Visit: Payer: No Typology Code available for payment source

## 2013-05-05 ENCOUNTER — Encounter: Payer: Self-pay | Admitting: Rehabilitation

## 2013-05-08 ENCOUNTER — Ambulatory Visit: Payer: No Typology Code available for payment source

## 2013-05-26 ENCOUNTER — Ambulatory Visit: Payer: No Typology Code available for payment source | Admitting: Rehabilitation

## 2013-05-29 ENCOUNTER — Ambulatory Visit: Payer: No Typology Code available for payment source | Attending: Internal Medicine

## 2013-05-29 DIAGNOSIS — M545 Low back pain, unspecified: Secondary | ICD-10-CM | POA: Insufficient documentation

## 2013-05-29 DIAGNOSIS — IMO0001 Reserved for inherently not codable concepts without codable children: Secondary | ICD-10-CM | POA: Insufficient documentation

## 2013-05-29 DIAGNOSIS — M25569 Pain in unspecified knee: Secondary | ICD-10-CM | POA: Insufficient documentation

## 2013-06-02 ENCOUNTER — Ambulatory Visit: Payer: No Typology Code available for payment source | Admitting: Rehabilitation

## 2013-06-05 ENCOUNTER — Ambulatory Visit: Payer: No Typology Code available for payment source

## 2013-06-09 ENCOUNTER — Ambulatory Visit: Payer: No Typology Code available for payment source | Admitting: Internal Medicine

## 2013-06-10 ENCOUNTER — Telehealth: Payer: Self-pay | Admitting: *Deleted

## 2013-06-10 NOTE — Telephone Encounter (Signed)
Yes if she is still using it that is fine. Please make the change

## 2013-06-10 NOTE — Telephone Encounter (Signed)
This is great BUT you will need to enter into EPIC

## 2013-06-10 NOTE — Telephone Encounter (Signed)
[facility] can not get Flonase any longer. Are you willing to change to Nasonex nasal spray 2 sprays each nostril daily ??

## 2013-06-11 ENCOUNTER — Ambulatory Visit (HOSPITAL_COMMUNITY)
Admission: RE | Admit: 2013-06-11 | Discharge: 2013-06-11 | Disposition: A | Discharge status: Home / Self Care | Payer: No Typology Code available for payment source | Source: Ambulatory Visit | Attending: Internal Medicine | Admitting: Internal Medicine

## 2013-06-11 ENCOUNTER — Ambulatory Visit (INDEPENDENT_AMBULATORY_CARE_PROVIDER_SITE_OTHER): Payer: No Typology Code available for payment source | Admitting: Internal Medicine

## 2013-06-11 ENCOUNTER — Other Ambulatory Visit: Payer: Self-pay | Admitting: Internal Medicine

## 2013-06-11 ENCOUNTER — Encounter: Payer: Self-pay | Admitting: Internal Medicine

## 2013-06-11 VITALS — BP 90/62 | HR 60 | Temp 97.0°F | Ht 64.0 in | Wt 159.4 lb

## 2013-06-11 DIAGNOSIS — H269 Unspecified cataract: Secondary | ICD-10-CM

## 2013-06-11 DIAGNOSIS — J449 Chronic obstructive pulmonary disease, unspecified: Secondary | ICD-10-CM

## 2013-06-11 DIAGNOSIS — Z0000 Encounter for general adult medical examination without abnormal findings: Secondary | ICD-10-CM

## 2013-06-11 DIAGNOSIS — J4489 Other specified chronic obstructive pulmonary disease: Secondary | ICD-10-CM

## 2013-06-11 DIAGNOSIS — M47817 Spondylosis without myelopathy or radiculopathy, lumbosacral region: Secondary | ICD-10-CM | POA: Insufficient documentation

## 2013-06-11 DIAGNOSIS — G8929 Other chronic pain: Secondary | ICD-10-CM

## 2013-06-11 DIAGNOSIS — M549 Dorsalgia, unspecified: Secondary | ICD-10-CM

## 2013-06-11 HISTORY — DX: Unspecified cataract: H26.9

## 2013-06-11 MED ORDER — MOMETASONE FUROATE 50 MCG/ACT NA SUSP: NASAL | Status: DC

## 2013-06-11 NOTE — Assessment & Plan Note (Signed)
Patient's back pain improved significantly with physical therapy and Tylenol and ibuprofen treatment. Currently she does not have alarming symptoms, such as weakness, numbness in her extremities. No incontinence.  - We'll continue physical therapy - Continue Tylenol and ibuprofen - will lumbar x-ray per physical therapy's request

## 2013-06-11 NOTE — Progress Notes (Signed)
Patient ID: Aimee Davis, female   DOB: Feb 20, 1956, 57 y.o.   MRN: [id number] Subjective:   Patient ID: Aimee Davis female   DOB: 01-27-1956 57 y.o.   MRN: [id number]  CC:   Follow up visit for back pain  HPI:  [name] is a 57 y.o. lady with past medical history as outlined below, who presents for a followup visit today.  1. Back pain: Patient was seen in clinic on 03/31/13 due to back pain. She has hx of chronic back pain. She developed acute back pain after lifting 5 lb heavy object. She did not have focal neurological deficits, no fever, chills, or unexpected weight loss.  Patient was given referral to physical therapy and treated with Tylenol and ibuprofen. She completed 3 weeks of physical therapy. Her back pain improved significantly. She still has some back pain currently, but no alarming symptoms, such as weakness, numbness, incontinence. Patient reports that physical therapy requested lumbar x-ray for further evaluation.   2. COPD  Patient's asthma is well controlled. She is currently taking albuterol inhaler and Advair inhaler regularly. She does not have chest pain, cough, shortness of breath, wheezing today.   ROS:  Denies fever, chills, fatigue, headaches,  cough, chest pain, SOB, diarrhea, constipation, dysuria, urgency, frequency, hematuria.    Past Medical History  Diagnosis Date  . Allergy     seasonal, PCN, antidepressants, bentyl  . Asthma   . Carpal tunnel syndrome on both sides     right worse than left 2008  . Hyperlipidemia     LDL 108 JULY 2013  . Positive TB test     From MArch 2009 note : Recent F/u at [facility]. CXRAy negative.  Positive TST in 2007 (14mm).  Unable to tolerate INH due to hepatotoxicity   Current Outpatient Prescriptions  Medication Sig Dispense Refill  . acetaminophen (TYLENOL) 325 MG tablet Take 1-2 tablets (325-650 mg total) by mouth every 6 (six) hours as needed for pain.  60 tablet  0  . albuterol (PROVENTIL HFA;VENTOLIN HFA)  108 (90 BASE) MCG/ACT inhaler Inhale 2 puffs into the lungs every 6 (six) hours as needed for wheezing.  1 Inhaler  2  . cholecalciferol (VITAMIN D) 1000 UNITS tablet Take 2,000 Units by mouth daily.      Marland Kitchen docusate sodium (COLACE) 100 MG capsule Take 1 capsule (100 mg total) by mouth daily as needed for constipation.  30 capsule  1  . esomeprazole (NEXIUM) 40 MG capsule Take 1 capsule (40 mg total) by mouth daily before breakfast.  30 capsule  3  . fish oil-omega-3 fatty acids 1000 MG capsule Take by mouth daily.      . Flaxseed MISC by Does not apply route.      . Fluticasone-Salmeterol (ADVAIR DISKUS) 250-50 MCG/DOSE AEPB Inhale 1 puff into the lungs 2 (two) times daily.  1 each  3  . ibuprofen (ADVIL,MOTRIN) 400 MG tablet Take 1 tablet (400 mg total) by mouth every 6 (six) hours as needed for pain.  60 tablet  0  . loratadine (CLARITIN) 10 MG tablet Take 10 mg by mouth as needed.      . mometasone (NASONEX) 50 MCG/ACT nasal spray Place 2 sprays in each nostril once daily as needed  17 g  5  . Multiple Vitamin (MULTIVITAMIN) capsule Take 1 capsule by mouth daily.      . polyethylene glycol (MIRALAX / GLYCOLAX) packet Take 17 g by mouth as needed.  No current facility-administered medications for this visit.   Family History  Problem Relation Age of Onset  . Hypertension Mother   . Hyperlipidemia Mother   . Heart disease Mother   . Diabetes Mother   . Kidney disease Mother   . Stroke Father   . Diabetes Father   . Heart disease Father   . Hyperlipidemia Father   . Hypertension Father   . Kidney disease Father   . Cancer Sister   . Mental illness Sister   . Diabetes Sister   . Kidney disease Sister   . Hypertension Sister   . Mental illness Brother   . Asthma Son   . Hypertension Maternal Aunt   . Hypertension Maternal Uncle   . Hypertension Paternal Aunt   . Kidney disease Paternal Aunt   . Hypertension Paternal Uncle   . Kidney disease Paternal Uncle    History    Social History  . Marital Status: Divorced    Spouse Name: N/A    Number of Children: N/A  . Years of Education: N/A   Social History Main Topics  . Smoking status: Former Smoker -- 0.33 packs/day    Types: Cigarettes    Quit date: 01/06/2013  . Smokeless tobacco: None     Comment: 6 cigs/day.  . Alcohol Use: No     Comment: QUIT 19 YEARS AGO--HEAVY USE  . Drug Use: No  . Sexual Activity: None     Comment: S/P PARTIAL HYSTERECTOMY   Other Topics Concern  . None   Social History Narrative  . None    Review of Systems: Full 14-point review of systems otherwise negative. See HPI.   Objective:  Physical Exam: Filed Vitals:   06/11/13 1540  BP: 90/62  Pulse: 60  Temp: 97 F (36.1 C)  TempSrc: Oral  Height: 5\' 4"  (1.626 m)  Weight: 159 lb 6.4 oz (72.303 kg)  SpO2: 97%   Vitals reviewed. General: sitting in chair, NAD HEENT: PERRL, EOMI, no scleral icterus Cardiac: RRR, no rubs, murmurs or gallops Pulm: clear to auscultation bilaterally, no wheezes, rales, or rhonchi Abd: soft, nontender, nondistended, BS present Ext: warm and well perfused, no pedal edema, +2DP B/L MSK: no tenderness to palpation of lower spine  But pain is elicited on flexion and extension.  Leg raise test negative bilaterally.  Neuro: alert and oriented X3, cranial nerves II-XII grossly intact, strength and sensation to light touch equal in bilateral upper and lower extremities     Assessment & Plan:

## 2013-06-11 NOTE — Assessment & Plan Note (Signed)
-  Patient refused flu shot and a Pap smear today, will postpone.

## 2013-06-11 NOTE — Telephone Encounter (Signed)
Done--please call in.

## 2013-06-11 NOTE — Patient Instructions (Signed)
1. please continue to take Tylenol and ibuprofen. Please continue physical therapy. 2. Please take all medications as prescribed.  3. If you have worsening of your symptoms or new symptoms arise, please call the clinic ([phone]), or go to the ER immediately if symptoms are severe.  You have done great job in taking all your medications. I appreciate it very much. Please continue doing that.

## 2013-06-11 NOTE — Assessment & Plan Note (Signed)
It is stable. Patient is currently using albuterol and Advair inhalers. She does not have signs of exacerbation. Lung auscultation is clear bilaterally. Will continue current regimen.

## 2013-06-11 NOTE — Assessment & Plan Note (Signed)
Patient has hx of a mild cataract in left eye. She was evaluated by ophthalmologist 2 years ago. She does not have vision change recently. She wants to be reevaluated by ophthalmologist. Will give her referral to ophthalmologist.

## 2013-06-11 NOTE — Telephone Encounter (Signed)
Rx faxed in.

## 2013-06-12 ENCOUNTER — Ambulatory Visit: Payer: No Typology Code available for payment source | Admitting: Physical Therapy

## 2013-06-12 NOTE — Progress Notes (Signed)
Case discussed with Dr. Niu at the time of the visit.  We reviewed the resident's history and exam and pertinent patient test results.  I agree with the assessment, diagnosis, and plan of care documented in the resident's note.    

## 2013-06-16 ENCOUNTER — Ambulatory Visit: Payer: No Typology Code available for payment source

## 2013-06-19 ENCOUNTER — Ambulatory Visit: Payer: No Typology Code available for payment source

## 2013-06-23 ENCOUNTER — Ambulatory Visit: Payer: No Typology Code available for payment source | Attending: Internal Medicine

## 2013-06-23 DIAGNOSIS — IMO0001 Reserved for inherently not codable concepts without codable children: Secondary | ICD-10-CM | POA: Insufficient documentation

## 2013-06-23 DIAGNOSIS — M25569 Pain in unspecified knee: Secondary | ICD-10-CM | POA: Insufficient documentation

## 2013-06-23 DIAGNOSIS — M545 Low back pain, unspecified: Secondary | ICD-10-CM | POA: Insufficient documentation

## 2013-06-26 ENCOUNTER — Ambulatory Visit: Payer: No Typology Code available for payment source

## 2013-06-30 ENCOUNTER — Ambulatory Visit: Payer: No Typology Code available for payment source

## 2013-07-07 ENCOUNTER — Ambulatory Visit: Payer: No Typology Code available for payment source

## 2013-07-08 ENCOUNTER — Other Ambulatory Visit: Payer: Self-pay | Admitting: *Deleted

## 2013-07-08 MED ORDER — ESOMEPRAZOLE MAGNESIUM 40 MG PO CPDR: 40.0000 mg | DELAYED_RELEASE_CAPSULE | Freq: Every day | ORAL | Status: DC

## 2013-07-08 NOTE — Telephone Encounter (Signed)
Nexium rx called to [facility].

## 2013-07-09 ENCOUNTER — Ambulatory Visit: Payer: No Typology Code available for payment source | Admitting: Rehabilitation

## 2013-07-14 ENCOUNTER — Ambulatory Visit (INDEPENDENT_AMBULATORY_CARE_PROVIDER_SITE_OTHER): Payer: No Typology Code available for payment source | Admitting: Internal Medicine

## 2013-07-14 VITALS — BP 94/66 | HR 78 | Temp 97.0°F | Ht 64.0 in | Wt 159.2 lb

## 2013-07-14 DIAGNOSIS — R5383 Other fatigue: Secondary | ICD-10-CM

## 2013-07-14 DIAGNOSIS — H9193 Unspecified hearing loss, bilateral: Secondary | ICD-10-CM

## 2013-07-14 DIAGNOSIS — Z0000 Encounter for general adult medical examination without abnormal findings: Secondary | ICD-10-CM

## 2013-07-14 DIAGNOSIS — R5381 Other malaise: Secondary | ICD-10-CM

## 2013-07-14 DIAGNOSIS — Z23 Encounter for immunization: Secondary | ICD-10-CM

## 2013-07-14 DIAGNOSIS — F17200 Nicotine dependence, unspecified, uncomplicated: Secondary | ICD-10-CM

## 2013-07-14 DIAGNOSIS — K589 Irritable bowel syndrome without diarrhea: Secondary | ICD-10-CM

## 2013-07-14 DIAGNOSIS — H9190 Unspecified hearing loss, unspecified ear: Secondary | ICD-10-CM

## 2013-07-14 LAB — T4, FREE: Free T4: 0.89 ng/dL (ref 0.80–1.80)

## 2013-07-14 NOTE — Patient Instructions (Signed)
You have received your flu vaccine today  If you notice worsening of your fatigue, please call and let us know. We will let you know results of your thyroid testing today  Please follow up with audiology appointment

## 2013-07-16 ENCOUNTER — Encounter: Payer: Self-pay | Admitting: Internal Medicine

## 2013-07-16 DIAGNOSIS — R5382 Chronic fatigue, unspecified: Secondary | ICD-10-CM | POA: Insufficient documentation

## 2013-07-16 DIAGNOSIS — R5383 Other fatigue: Secondary | ICD-10-CM | POA: Insufficient documentation

## 2013-07-16 DIAGNOSIS — H903 Sensorineural hearing loss, bilateral: Secondary | ICD-10-CM | POA: Insufficient documentation

## 2013-07-16 NOTE — Progress Notes (Signed)
Subjective:   Patient ID: Aimee Davis female   DOB: [birth year] 57 y.o.   MRN: [id number]  HPI: Aimee Davis is a 57 y.o. African American female with PMH of IBS, chronic tobacco abuse (now quit since April 2014), and asthma presenting to [facility] today for routine follow up visit.   She reports improvement in her back pain and has finished working with physical therapy.  She continues to have morning cramps secondary to her IBS, which she says she has been having for several years.  She says the discomfort is only in the morning and relieved with nexium.  She has not tried taking nexium at night. She says she has been to [facility] and has her a colonoscopy before as well and no one is able to provide a good solution.  Reports from 2011 colonoscopy seem to show normal colonoscopy but will need to request official visit note and report from [facility] note.  She was taking NSAID's along with working with PT for her back pain, and we discussed trying to limit NSAID use as much as possible along with trying Nexium in the evenings instead of in the mornings.  If this does not help, she may benefit from returning to GI for further evaluation.  She denies any blood in her stool or urine, abdominal pain, fever, chills, chest pain, SOB at this time.   Her only other main complaint is that she has been feeling tired for the past 3 week. She reports just feeling "drained" with little energy but is not sure why. She denies any change in recent habits, claims she sleeps fine with no problems, does not feel like she is stressed or depressed. She does occasionally feel very cold and then hot. Review of current medications does not seem to be obvious cause. She has a good appetite and weight has been stable lately and infact she has gained weight since July 2014. Of note, her BP does usually run low, today noted to be 94/66 but she says this has always been the case and is not new.   Aimee Davis has also  stopped smoking since April 2014 and she is very happy about that. I have also congratulated her again on her cessation and hope that it will continue.   Finally, she endorses difficulty hearing lately, especially when she is at church, she says she has been having a hard time hearing everything as clear as she has in the past.  She denies any ear pain, hx of infections, drainage, dizziness, or headaches.   Past Medical History  Diagnosis Date  . Allergy     seasonal, PCN, antidepressants, bentyl  . Asthma   . Carpal tunnel syndrome on both sides     right worse than left 2008  . Hyperlipidemia     LDL 108 JULY 2013  . Positive TB test     From [facility] 2009 note : Recent F/u at [facility]. CXRAy negative.  Positive TST in 2007 (14mm).  Unable to tolerate INH due to hepatotoxicity   Current Outpatient Prescriptions  Medication Sig Dispense Refill  . albuterol (PROVENTIL HFA;VENTOLIN HFA) 108 (90 BASE) MCG/ACT inhaler Inhale 2 puffs into the lungs every 6 (six) hours as needed for wheezing.  1 Inhaler  2  . esomeprazole (NEXIUM) 40 MG capsule Take 1 capsule (40 mg total) by mouth daily before breakfast.  90 capsule  3  . fish oil-omega-3 fatty acids 1000 MG capsule Take by  mouth daily.      . Flaxseed MISC by Does not apply route.      . Fluticasone-Salmeterol (ADVAIR DISKUS) 250-50 MCG/DOSE AEPB Inhale 1 puff into the lungs 2 (two) times daily.  1 each  3  . loratadine (CLARITIN) 10 MG tablet Take 10 mg by mouth as needed.      . mometasone (NASONEX) 50 MCG/ACT nasal spray Place 2 sprays in each nostril once daily as needed  17 g  5  . Multiple Vitamin (MULTIVITAMIN) capsule Take 1 capsule by mouth daily.      Marland Kitchen acetaminophen (TYLENOL) 325 MG tablet Take 1-2 tablets (325-650 mg total) by mouth every 6 (six) hours as needed for pain.  60 tablet  0  . docusate sodium (COLACE) 100 MG capsule Take 1 capsule (100 mg total) by mouth daily as needed for constipation.  30 capsule  1  . ibuprofen  (ADVIL,MOTRIN) 400 MG tablet Take 1 tablet (400 mg total) by mouth every 6 (six) hours as needed for pain.  60 tablet  0  . polyethylene glycol (MIRALAX / GLYCOLAX) packet Take 17 g by mouth as needed.       No current facility-administered medications for this visit.   Family History  Problem Relation Age of Onset  . Hypertension Mother   . Hyperlipidemia Mother   . Heart disease Mother   . Diabetes Mother   . Kidney disease Mother   . Stroke Father   . Diabetes Father   . Heart disease Father   . Hyperlipidemia Father   . Hypertension Father   . Kidney disease Father   . Cancer Sister   . Mental illness Sister   . Diabetes Sister   . Kidney disease Sister   . Hypertension Sister   . Mental illness Brother   . Asthma Son   . Hypertension Maternal Aunt   . Hypertension Maternal Uncle   . Hypertension Paternal Aunt   . Kidney disease Paternal Aunt   . Hypertension Paternal Uncle   . Kidney disease Paternal Uncle    History   Social History  . Marital Status: Divorced    Spouse Name: N/A    Number of Children: N/A  . Years of Education: N/A   Social History Main Topics  . Smoking status: Former Smoker -- 0.33 packs/day    Types: Cigarettes    Quit date: 01/06/2013  . Smokeless tobacco: Not on file     Comment: 6 cigs/day.  . Alcohol Use: No     Comment: QUIT 19 YEARS AGO--HEAVY USE  . Drug Use: No  . Sexual Activity: Not on file     Comment: S/P PARTIAL HYSTERECTOMY   Other Topics Concern  . Not on file   Social History Narrative  . No narrative on file   Review of Systems:  Constitutional:  Fatigue, occasionally feeling hot and cold. Denies fever, diaphoresis, appetite change.   HEENT:  Decreased hearing.  Denies congestion, sore throat  Respiratory:  Denies SOB, DOE, cough, and wheezing.   Cardiovascular:  Denies chest pain, palpitations, and leg swelling.   Gastrointestinal:  Hx of IBS, AM abdominal cramps, occasional constipation. Denies nausea,  vomiting, abdominal pain, diarrhea, blood in stool and abdominal distention.   Genitourinary:  Denies dysuria, urgency, frequency, hematuria, flank pain and difficulty urinating.   Musculoskeletal:  Occasional back pain. Denies myalgias, joint swelling, arthralgias and gait problem.   Skin:  Denies pallor, rash and wound.   Neurological:  Denies  dizziness, syncope, weakness, light-headedness, numbness and headaches.    Objective:  Physical Exam: Filed Vitals:   07/14/13 1548  BP: 94/66  Pulse: 78  Temp: 97 F (36.1 C)  TempSrc: Oral  Height: 5\' 4"  (1.626 m)  Weight: 159 lb 3.2 oz (72.213 kg)  SpO2: 99%   Vitals reviewed. General: sitting in chair, NAD HEENT: PERRL, EOMI. No deficits noted on whisper test. Mild erythema noted on left inner surface of tragus but no visible purulent discharge or bulging of TM b/l.  Cardiac: RRR, no rubs, murmurs or gallops Pulm: clear to auscultation bilaterally, no wheezes, rales, or rhonchi Abd: soft, nontender, nondistended, BS present Ext: warm and well perfused, no pedal edema, +2DP B/L Neuro: alert and oriented X3, cranial nerves II-XII grossly intact, strength and sensation to light touch equal in bilateral upper and lower extremities  Assessment & Plan:  Discussed with Dr. Josem Kaufmann F/u TSH Flu vaccine given today

## 2013-07-16 NOTE — Assessment & Plan Note (Signed)
Cramping noted only in AM and resolved with nexium. Was apparently supposed to have a colonoscopy done in February or March 2014 but no clear records on epic and patient does not recall having another colonoscopy since 2011. Will again try to get records from [facility] and [facility].  -recommended cessation of NSAIDs -will try nexium at night -continue high fiber diet

## 2013-07-16 NOTE — Assessment & Plan Note (Addendum)
Flu vaccine today Will try for pap next visit--last one appears to be in 2011. Hx of partial hysterectomy

## 2013-07-16 NOTE — Assessment & Plan Note (Addendum)
Ongoing for past 3 weeks. Occasionally feels hot and cold. No anemia noted per cbc January 2014. No weight loss, night sweats, or fever. No signs of depression at this time. Has good appetite.   -will check tsh for now -continue to monitor, if no improvement, will need to work up further -she is becoming more active after working with PT for back and knee pain -will also continue to follow up BP and blood sugars, BP she says has always run low and on review, bmet from 2011 did show glucose value <60.

## 2013-07-16 NOTE — Assessment & Plan Note (Signed)
Ms. Busker subjectively reports difficulty hearing over the past several months. She says she feels like she is straining to hear more lately, especially noted when she is at church. Denies any ear pain, drainage, trauma, headaches, or dizziness.  No deficits noted on physical exam today. She is requesting to see hearing specialist and as such we will try to refer to audiology center if possible with her orange card.

## 2013-07-16 NOTE — Assessment & Plan Note (Signed)
  Assessment: Progress toward smoking cessation:  Has quit since April 2014  Plan: Instruction/counseling given:  I commended patient for quitting and reviewed strategies for preventing relapses. Educational resources provided:    Self management tools provided:    Medications to assist with smoking cessation:  None Patient agreed to the following self-care plans for smoking cessation:

## 2013-07-17 NOTE — Progress Notes (Signed)
Case discussed with Dr. Qureshi soon after the resident saw the patient.  We reviewed the resident's history and exam and pertinent patient test results.  I agree with the assessment, diagnosis and plan of care documented in the resident's note. 

## 2013-07-30 ENCOUNTER — Other Ambulatory Visit: Payer: Self-pay | Admitting: *Deleted

## 2013-07-30 MED ORDER — ALBUTEROL SULFATE HFA 108 (90 BASE) MCG/ACT IN AERS: 2.0000 | INHALATION_SPRAY | Freq: Four times a day (QID) | RESPIRATORY_TRACT | Status: DC | PRN

## 2013-07-30 NOTE — Telephone Encounter (Signed)
Rx faxed in.

## 2013-08-04 ENCOUNTER — Telehealth: Payer: Self-pay | Admitting: *Deleted

## 2013-08-04 NOTE — Telephone Encounter (Signed)
I would urge the patient to go to the ED.

## 2013-08-04 NOTE — Telephone Encounter (Signed)
Pt informed and will go to ED 

## 2013-08-04 NOTE — Telephone Encounter (Signed)
Pt called with c/o pain shooting through heart.  Pain last 10 - 20 minutes.  During this time she will get SOB.  Denies dizziness, sweating and nausea. This pain is on and off. Unable to rate pain, it just gets her attention. Onset 3 days ago. This sensation is new. There is nothing that will relieve this pain, she gets it at rest and with activity.  Will see today unless you want her to go to ED Pt # [phone]

## 2013-08-25 ENCOUNTER — Ambulatory Visit: Payer: No Typology Code available for payment source | Admitting: Audiology

## 2013-09-04 ENCOUNTER — Ambulatory Visit: Payer: No Typology Code available for payment source | Attending: Internal Medicine | Admitting: Audiology

## 2013-09-04 DIAGNOSIS — H9193 Unspecified hearing loss, bilateral: Secondary | ICD-10-CM

## 2013-09-04 DIAGNOSIS — M545 Low back pain, unspecified: Secondary | ICD-10-CM | POA: Insufficient documentation

## 2013-09-04 DIAGNOSIS — M25569 Pain in unspecified knee: Secondary | ICD-10-CM | POA: Insufficient documentation

## 2013-09-04 DIAGNOSIS — H93293 Other abnormal auditory perceptions, bilateral: Secondary | ICD-10-CM

## 2013-09-04 DIAGNOSIS — IMO0001 Reserved for inherently not codable concepts without codable children: Secondary | ICD-10-CM | POA: Insufficient documentation

## 2013-09-04 NOTE — Procedures (Signed)
[facility] [address] [place], [place]  [address] [phone]  AUDIOLOGICAL EVALUATION  Patient Name: Aimee Davis  Medical Record Number:  [id number] Date of Birth:  12-06-1955     Date of Test:  09/04/2013  HISTORY:   Patient was seen for audiological evaluation upon referral of Darden Palmer, MD due to decreased hearing.  She reported a history of trauma to the left ear in the 1970's and presently notices difficulty with conversational speech, the telephone, and television.  She is here today at the urging of her family who become frustrated with her need for instructions to be repeated.  Ms. Colberg has a history of occupational noise exposure ([employer]) without ear protection and a familial history of hearing loss (mother wears aids since 48yrs of age).  She denies middle ear disease, significant tinnitus, dizziness, headaches and facial numbness.    REPORT OF PAIN: None   EVALUATION: Air and bone conduction audiometry from 500Hz  - 8000Hz  utilizing insert earphones revealed low normal to a slight sensory neural loss bilaterally. Speech recognition testing was conducted in each ear independently, at a comfortable (slightly elevated) listening level (60-65dBHL) and indicated 88% and 88% in the right and left ears respectively. When a competing noise was present (s/n+5) the scores fell to 64% and 28% in the right and left ears. Impedance audiometry was utilized and a Type Ad tympanogram was obtained on the right side and a Type Ad tympanogram was obtained on the left side suggesting very compliant middle ear functioning on both sides. Acoustic reflexes were tested from 500Hz  - 2000Hz  and were present on the right side with ipsilateral stimulation and with contralateral stimulation. Distortion Product Otoacoustic Emissions were tested from 2000Hz  - 10000Hz  and were weak on the right side and weak on the left side, indicative of  decreased outer hair cell function within in the inner ear.   CONCLUSION:  Ms. Kistler has low normal to a slight sensory neural loss bilaterally with slightly reduced speech recognition in quiet and significantly reduced speech recognition when a soft background noise is present.  Her DPOAEs suggest weakened outer hair cell functioning on both sides.  It is very possible that her hearing is in transition and a shifting of the pure tones over the next year is very possible.    RECOMMENDATIONS: 1.  Ms. Fister does not wish to pursue amplification at this time, which is reasonable.  She was however cautioned that should her speech recognition if quiet fall any further, she would need to consider it.  Audiological monitoring in 6 months is suggested to ensure the stability of her hearing acuity. 2. Research has shown that playing a musical instrument can improve ones ability for understanding speech in the presence of a background noise by strengthening the central auditory processing system. She mentioned that she was interested in playing the piano and may look into lessons.  3. In addition, there are also ways to improve one's listening environment. Such as:  (A) When in a restaurant, sit with your back against a perimeter wall, thus reducing the extraneous noise.   (B) When in meetings, do not sit near an open doorway and position yourself within 8-10ft of the speaker. Front and center is typically best.  (C) Eliminate background noises at home such as the television, dishwasher, etc when having important conversations.  (D) Ask family members to face you when they speak and not to drop their heads or turn  and walkway while still speaking.   4.  Lastly, because Ms. Wilhelmsen has a weakened auditory system she is more prone to damage from noise exposure than one with a healthy auditory system.  She was encouraged to use ear protection whenever necessary.     Allyn Kenner Sheila Oats  Doctor of  Audiology 09/04/2013 9:35 AM  cc:  Darden Palmer, MD [address] [place], [place] [address]

## 2013-09-04 NOTE — Patient Instructions (Signed)
CONCLUSION:  [name] has low normal to a slight sensory neural loss bilaterally with slightly reduced speech recognition in quiet and significantly reduced speech recognition when a soft background noise is present.  Her DPOAEs suggest weakened outer hair cell functioning on both sides.  It is very possible that her hearing is in transition and a shifting of the pure tones over the next year is possible.    RECOMMENDATIONS: 1.  [name] does not wish to pursue amplification at this time, which is reasonable.  She was however cautioned that should her speech recognition if quiet fall any further, she would need to consider it.  Audiological monitoring in 6 months is suggested to ensure the stability of her hearing acuity. 2. Research has shown that playing a musical instrument can improve ones ability for understanding speech in the presence of a background noise by strengthening the central auditory processing system. She mentioned that she was interested in playing the piano and may look into lessons.  3. In addition, there are also ways to improve one's listening environment. Such as:  (A) When in a restaurant, sit with your back against a perimeter wall, thus reducing the extraneous noise.   (B) When in meetings, do not sit near an open doorway and position yourself within 8-10ft of the speaker. Front and center is typically best.  (C) Eliminate background noises at home such as the television, dishwasher, etc when having important conversations.  (D) Ask family members to face you when they speak and not to drop their heads or turn and walkway while still speaking.   4.  Lastly, because [name] has a weakened auditory system she is more prone to damage from noise exposure than one with a healthy auditory system.  She was encouraged to use ear protection whenever necessary.     Aimee Davis Aimee Davis  Doctor of Audiology 09/04/2013 9:35 AM  cc:  Darden Palmer, MD [address]  [place], [place] [address]

## 2013-09-08 ENCOUNTER — Ambulatory Visit: Payer: No Typology Code available for payment source

## 2013-09-18 ENCOUNTER — Ambulatory Visit: Payer: No Typology Code available for payment source

## 2013-10-23 ENCOUNTER — Telehealth: Payer: Self-pay | Admitting: *Deleted

## 2013-10-23 NOTE — Telephone Encounter (Signed)
Appt has been scheduled on Tuesday 10/27/13 @ 1415PM.

## 2013-10-23 NOTE — Telephone Encounter (Signed)
Message left by pt - requesting an rx for Arthrotec for arthritis. Returned pt's call - no answer; left message for pt to call and schedule an appt w/Dr Eula Fried to discuss this medication.

## 2013-10-27 ENCOUNTER — Encounter (HOSPITAL_COMMUNITY): Payer: Self-pay | Admitting: General Practice

## 2013-10-27 ENCOUNTER — Ambulatory Visit (HOSPITAL_COMMUNITY)
Admission: RE | Admit: 2013-10-27 | Discharge: 2013-10-27 | Disposition: A | Discharge status: Home / Self Care | Payer: No Typology Code available for payment source | Source: Ambulatory Visit | Attending: Internal Medicine | Admitting: Internal Medicine

## 2013-10-27 ENCOUNTER — Observation Stay (HOSPITAL_COMMUNITY)
Admission: AD | Admit: 2013-10-27 | Discharge: 2013-10-28 | Disposition: A | Discharge status: Home / Self Care | Payer: No Typology Code available for payment source | Source: Ambulatory Visit | Attending: Infectious Disease | Admitting: Infectious Disease

## 2013-10-27 ENCOUNTER — Encounter: Payer: Self-pay | Admitting: Internal Medicine

## 2013-10-27 ENCOUNTER — Ambulatory Visit (INDEPENDENT_AMBULATORY_CARE_PROVIDER_SITE_OTHER): Payer: No Typology Code available for payment source | Admitting: Internal Medicine

## 2013-10-27 VITALS — BP 96/72 | HR 91 | Temp 98.3°F | Ht 64.0 in | Wt 161.8 lb

## 2013-10-27 DIAGNOSIS — R51 Headache: Secondary | ICD-10-CM

## 2013-10-27 DIAGNOSIS — J45909 Unspecified asthma, uncomplicated: Secondary | ICD-10-CM | POA: Diagnosis present

## 2013-10-27 DIAGNOSIS — R5383 Other fatigue: Secondary | ICD-10-CM

## 2013-10-27 DIAGNOSIS — R42 Dizziness and giddiness: Principal | ICD-10-CM | POA: Insufficient documentation

## 2013-10-27 DIAGNOSIS — Z7729 Contact with and (suspected ) exposure to other hazardous substances: Secondary | ICD-10-CM | POA: Diagnosis present

## 2013-10-27 DIAGNOSIS — J4489 Other specified chronic obstructive pulmonary disease: Secondary | ICD-10-CM | POA: Insufficient documentation

## 2013-10-27 DIAGNOSIS — K219 Gastro-esophageal reflux disease without esophagitis: Secondary | ICD-10-CM | POA: Insufficient documentation

## 2013-10-27 DIAGNOSIS — E785 Hyperlipidemia, unspecified: Secondary | ICD-10-CM | POA: Insufficient documentation

## 2013-10-27 DIAGNOSIS — Z87891 Personal history of nicotine dependence: Secondary | ICD-10-CM | POA: Insufficient documentation

## 2013-10-27 DIAGNOSIS — Z0389 Encounter for observation for other suspected diseases and conditions ruled out: Secondary | ICD-10-CM | POA: Insufficient documentation

## 2013-10-27 DIAGNOSIS — J449 Chronic obstructive pulmonary disease, unspecified: Secondary | ICD-10-CM | POA: Diagnosis present

## 2013-10-27 DIAGNOSIS — R5381 Other malaise: Secondary | ICD-10-CM | POA: Insufficient documentation

## 2013-10-27 DIAGNOSIS — K589 Irritable bowel syndrome without diarrhea: Secondary | ICD-10-CM | POA: Diagnosis present

## 2013-10-27 DIAGNOSIS — Z880 Allergy status to penicillin: Secondary | ICD-10-CM | POA: Insufficient documentation

## 2013-10-27 DIAGNOSIS — F17200 Nicotine dependence, unspecified, uncomplicated: Secondary | ICD-10-CM

## 2013-10-27 DIAGNOSIS — D649 Anemia, unspecified: Secondary | ICD-10-CM | POA: Insufficient documentation

## 2013-10-27 HISTORY — DX: Chronic obstructive pulmonary disease, unspecified: J44.9

## 2013-10-27 HISTORY — DX: Gastro-esophageal reflux disease without esophagitis: K21.9

## 2013-10-27 LAB — CBC WITH DIFFERENTIAL/PLATELET
BASOS PCT: 0 % (ref 0–1)
Basophils Absolute: 0 10*3/uL (ref 0.0–0.1)
EOS ABS: 0.1 10*3/uL (ref 0.0–0.7)
EOS PCT: 3 % (ref 0–5)
HEMATOCRIT: 38.8 % (ref 36.0–46.0)
HEMOGLOBIN: 13 g/dL (ref 12.0–15.0)
LYMPHS ABS: 1.3 10*3/uL (ref 0.7–4.0)
Lymphocytes Relative: 37 % (ref 12–46)
MCH: 27.4 pg (ref 26.0–34.0)
MCHC: 33.5 g/dL (ref 30.0–36.0)
MCV: 81.9 fL (ref 78.0–100.0)
MONO ABS: 0.2 10*3/uL (ref 0.1–1.0)
MONOS PCT: 6 % (ref 3–12)
Neutro Abs: 2 10*3/uL (ref 1.7–7.7)
Neutrophils Relative %: 54 % (ref 43–77)
Platelets: 242 10*3/uL (ref 150–400)
RBC: 4.74 MIL/uL (ref 3.87–5.11)
RDW: 13 % (ref 11.5–15.5)
WBC: 3.7 10*3/uL — ABNORMAL LOW (ref 4.0–10.5)

## 2013-10-27 LAB — COMPREHENSIVE METABOLIC PANEL
ALK PHOS: 61 U/L (ref 39–117)
ALT: 20 U/L (ref 0–35)
AST: 23 U/L (ref 0–37)
Albumin: 3.8 g/dL (ref 3.5–5.2)
BILIRUBIN TOTAL: 0.3 mg/dL (ref 0.3–1.2)
BUN: 18 mg/dL (ref 6–23)
CO2: 26 meq/L (ref 19–32)
CREATININE: 0.86 mg/dL (ref 0.50–1.10)
Calcium: 9.8 mg/dL (ref 8.4–10.5)
Chloride: 104 mEq/L (ref 96–112)
GLUCOSE: 106 mg/dL — AB (ref 70–99)
Potassium: 5.1 mEq/L (ref 3.5–5.3)
Sodium: 142 mEq/L (ref 135–145)
Total Protein: 6.7 g/dL (ref 6.0–8.3)

## 2013-10-27 LAB — CARBOXYHEMOGLOBIN
CARBOXYHEMOGLOBIN: 1.6 % — AB (ref 0.5–1.5)
Methemoglobin: 1.3 % (ref 0.0–1.5)
O2 Saturation: 97.5 %
TOTAL HEMOGLOBIN: 13.2 g/dL (ref 12.0–16.0)

## 2013-10-27 LAB — LACTIC ACID, PLASMA: LACTIC ACID, VENOUS: 0.9 mmol/L (ref 0.5–2.2)

## 2013-10-27 MED ORDER — ENOXAPARIN SODIUM 40 MG/0.4ML ~~LOC~~ SOLN
40.0000 mg | SUBCUTANEOUS | Status: DC
  Administered 2013-10-27: 40 mg via SUBCUTANEOUS
  Filled 2013-10-27 (×2): qty 0.4

## 2013-10-27 MED ORDER — PANTOPRAZOLE SODIUM 40 MG PO TBEC
80.0000 mg | DELAYED_RELEASE_TABLET | Freq: Every day | ORAL | Status: DC
Start: 2013-10-28 — End: 2013-10-28
  Administered 2013-10-28: 80 mg via ORAL
  Filled 2013-10-27: qty 2

## 2013-10-27 MED ORDER — ALBUTEROL SULFATE (2.5 MG/3ML) 0.083% IN NEBU: 2.5000 mg | INHALATION_SOLUTION | Freq: Four times a day (QID) | RESPIRATORY_TRACT | Status: DC | PRN

## 2013-10-27 MED ORDER — MOMETASONE FURO-FORMOTEROL FUM 100-5 MCG/ACT IN AERO
2.0000 | INHALATION_SPRAY | Freq: Two times a day (BID) | RESPIRATORY_TRACT | Status: DC
  Administered 2013-10-27: 2 via RESPIRATORY_TRACT
  Filled 2013-10-27: qty 8.8

## 2013-10-27 MED ORDER — ALBUTEROL SULFATE HFA 108 (90 BASE) MCG/ACT IN AERS: 2.0000 | INHALATION_SPRAY | Freq: Four times a day (QID) | RESPIRATORY_TRACT | Status: DC | PRN

## 2013-10-27 MED ORDER — SODIUM CHLORIDE 0.9 % IV SOLN
INTRAVENOUS | Status: AC
  Administered 2013-10-27 – 2013-10-28 (×2): via INTRAVENOUS

## 2013-10-27 NOTE — Assessment & Plan Note (Signed)
Has quit for ~4 months now. Using nicorette gum past few months for up to 4 times a day. Could contribute to headache and nausea as well.   -recommended discontinuing nicorette for now until acute issues resolved and will revisit NRT that will be more suitable for her

## 2013-10-27 NOTE — H&P (Signed)
Date: 10/27/2013               Patient Name:  AVERYANA POLEGA MRN: [id number]  DOB: 1955/12/10 Age / Sex: 58 y.o., female   PCP: Darden Palmer, MD              Medical Service: Internal Medicine Teaching Service              Attending Physician: Dr. Randall Hiss, MD    First Contact: Dr. Mikey Bussing  Pager: [phone]  Second Contact: Dr. Zada Girt Pager: [phone]            After Hours (After 5p/  First Contact Pager: [phone]  weekends / holidays): Second Contact Pager: [phone]   Chief Complaint: dizziness and fatigue  History of Present Illness:  Ms.Kelsay C Zegarra is a 58 y.o. African American female with PMH of IBS, chronic tobacco abuse (now quit since April 2014), and asthma who presented to [facility] today for questions about arthritis medication and also reports of recent onset dizziness, nausea, and headache.   Upon further questioning, she reported that on Sunday night 10/25/13 she was boiling some water on her gas stove in her apartment and ended up falling asleep while the stove was on. She said she thought if she let the heat on low she would wake up in time but did not wake up for several hours. When she did wake up, it was due to a severe headache and she had fumes in her apartment from the stove. The pan was scorched and she reports poor ventilation in her apartment as well. Since then, she has been having on and off headaches, occasional nausea, but main complaint is dizziness especially with any movement. She denies any similar episodes, no tinnitus, or vomiting. She does not have blurry vision but has trouble focusing her vision when she is dizzy. She denies syncope but does have the sensation like she will fall when she is dizzy, but has not fallen. The only change in her medication list is starting nicorette gum ~4 months ago that she chews up to 4 times a day, but this is nothing very recent and had no dizziness prior to Sunday.   ABG was done in clinic: carboxyhemoglobin  level slightly elevated at 1.6, o2 sat 97.5%. cxr neg. Orthostatic vital signs positive due to heart rate. BP 104/73 lying with HR 83 and BP 95/72 with HR 108 on standing.   Review of Systems: Constitutional: Denies fever, chills, diaphoresis, appetite change HEENT: Denies photophobia, eye pain, redness, hearing loss, ear pain, congestion, sore throat, rhinorrhea, sneezing, mouth sores, trouble swallowing, neck pain, neck stiffness and tinnitus.  Respiratory: Denies SOB, DOE, cough, chest tightness, and wheezing.  Cardiovascular: Denies chest pain, palpitations and leg swelling.  Gastrointestinal: she reports nausea, and on/off abdominal pain which she attributes. She denies diarrhea, constipation,blood in stool and abdominal distention.  Genitourinary: Denies dysuria, urgency, frequency, hematuria, flank pain and difficulty urinating.  Musculoskeletal: Denies myalgias, back pain, joint swelling, arthralgias and gait problem.  Skin: Denies pallor, rash and wound.  Hematological: Denies adenopathy. Easy bruising, personal or family bleeding history  Psychiatric/Behavioral: Denies suicidal ideation, mood changes, confusion, nervousness, sleep disturbance and agitation  Meds:  Medications Prior to Admission  Medication Sig Dispense Refill  . acetaminophen (TYLENOL) 325 MG tablet Take 1-2 tablets (325-650 mg total) by mouth every 6 (six) hours as needed for pain.  60 tablet  0  . albuterol (PROVENTIL HFA;VENTOLIN HFA) 108 (90  BASE) MCG/ACT inhaler Inhale 2 puffs into the lungs every 6 (six) hours as needed for wheezing.  1 Inhaler  6  . docusate sodium (COLACE) 100 MG capsule Take 1 capsule (100 mg total) by mouth daily as needed for constipation.  30 capsule  1  . esomeprazole (NEXIUM) 40 MG capsule Take 1 capsule (40 mg total) by mouth daily before breakfast.  90 capsule  3  . fish oil-omega-3 fatty acids 1000 MG capsule Take by mouth daily.      . Flaxseed MISC by Does not apply route.      .  Fluticasone-Salmeterol (ADVAIR DISKUS) 250-50 MCG/DOSE AEPB Inhale 1 puff into the lungs 2 (two) times daily.  1 each  3  . ibuprofen (ADVIL,MOTRIN) 400 MG tablet Take 1 tablet (400 mg total) by mouth every 6 (six) hours as needed for pain.  60 tablet  0  . loratadine (CLARITIN) 10 MG tablet Take 10 mg by mouth as needed.      . mometasone (NASONEX) 50 MCG/ACT nasal spray Place 2 sprays in each nostril once daily as needed  17 g  5  . Multiple Vitamin (MULTIVITAMIN) capsule Take 1 capsule by mouth daily.       Allergies: Allergies as of 10/27/2013 - Review Complete 10/27/2013  Allergen Reaction Noted  . Penicillins  03/25/2006   Past Medical History  Diagnosis Date  . Allergy     seasonal, PCN, antidepressants, bentyl  . Asthma   . Carpal tunnel syndrome on both sides     right worse than left 2008  . Hyperlipidemia     LDL 108 JULY 2013  . Positive TB test     From [facility] 2009 note : Recent F/u at [facility]. CXRAy negative.  Positive TST in 2007 (80mm).  Unable to tolerate INH due to hepatotoxicity  . COPD (chronic obstructive pulmonary disease)   . Anemia   . GERD (gastroesophageal reflux disease)   . Chronic lower back pain    Past Surgical History  Procedure Laterality Date  . Induced abortion  2005  . Vaginal hysterectomy  1983    partial; for abnormal bleeding  . Breast lumpectomy Right 1990's  . Breast cyst excision Right 2011   Family History  Problem Relation Age of Onset  . Hypertension Mother   . Hyperlipidemia Mother   . Heart disease Mother   . Diabetes Mother   . Kidney disease Mother   . Stroke Father   . Diabetes Father   . Heart disease Father   . Hyperlipidemia Father   . Hypertension Father   . Kidney disease Father   . Cancer Sister   . Mental illness Sister   . Diabetes Sister   . Kidney disease Sister   . Hypertension Sister   . Mental illness Brother   . Asthma Son   . Hypertension Maternal Aunt   . Hypertension Maternal Uncle   . Hypertension  Paternal Aunt   . Kidney disease Paternal Aunt   . Hypertension Paternal Uncle   . Kidney disease Paternal Uncle    History   Social History  . Marital Status: Divorced    Spouse Name: N/A    Number of Children: N/A  . Years of Education: N/A   Occupational History  . Not on file.   Social History Main Topics  . Smoking status: Former Smoker -- 0.33 packs/day for 43 years    Types: Cigarettes    Quit date: 01/06/2013  . Smokeless  tobacco: Never Used  . Alcohol Use: Yes     Comment: 10/27/2013 "quit drinking alcohol 04/27/1993"  . Drug Use: Yes    Special: Cocaine, Marijuana     Comment: 10/27/2013 "quit using drugs 04/27/1993"  . Sexual Activity: No     Comment: S/P PARTIAL HYSTERECTOMY   Other Topics Concern  . Not on file   Social History Narrative  . No narrative on file    Physical Exam: Filed Vitals:   10/27/13 2033  BP: 107/77  Pulse: 98  Temp:   Resp:    General: well developed, well nourished; no acute distressed, cooperative with exam Head: atraumatic, normocephalic,  Eye: pupils equal, round and reactive; sclera anicteric; normal conjunctiva  Nose/throat: oropharynx clear, moist mucous membranes, pink gums  Neck: supple Lungs/Chest wall: clear to auscultation bilaterally, normal work of breathing  Heart: normal rate and regular rhythm; no murmurs Pulses: radial and dorsalis pedis pulses are 2+ and symmetric  Abdomen: Normal fullness, no rebound, guarding, or rigidity; normal bowel sounds; no masses or organomegaly  Skin: warm, dry, intact, normal turgor, no rashes  Extremities: no peripheral edema, clubbing, or cyanosis Neurologic:   Neurologic Exam:   Mental Status: Alert, oriented, thought content appropriate.  Speech fluent without evidence of aphasia. Able to follow 3 step commands without difficulty.  Cranial Nerves:   II: Discs flat bilaterally. Visual fields grossly intact.  III/IV/VI: Extraocular movements intact.  Pupils reactive  bilaterally.  V/VII: Smile symmetric. facial light touch sensation normal bilaterally.  VIII: Grossly intact.     XI: Bilateral shoulder shrug normal.  XII: Midline tongue extension normal.  Motor:  5/5 bilaterally with normal tone and bulk  Sensory:   light touch intact throughout, bilaterally  DTRs: 2+ and symmetric throughout     Cerebellar: Some difficulties with finger-to-nose, normal rapid alternating movements and normal heel-to-shin test.  Normal gait and station. No nystagmus provoked by the Dix-Hallpike, but patient reported increased dizziness.    Lab results: Basic Metabolic Panel:  Recent Labs  10/27/13 1557  NA 142  K 5.1  CL 104  CO2 26  GLUCOSE 106*  BUN 18  CREATININE 0.86  CALCIUM 9.8   Liver Function Tests:  Recent Labs  10/27/13 1557  AST 23  ALT 20  ALKPHOS 61  BILITOT 0.3  PROT 6.7  ALBUMIN 3.8   ABG    Component Value Date/Time   O2SAT 97.5 10/27/2013 1550     CBC:  Recent Labs  10/27/13 1557  WBC 3.7*  NEUTROABS 2.0  HGB 13.0  HCT 38.8  MCV 81.9  PLT 242    Imaging results:  Dg Chest 2 View  10/27/2013   CLINICAL DATA:  Carbon monoxide exposure.  EXAM: CHEST  2 VIEW  COMPARISON:  Chest CT 03/17/2010 and chest radiograph 03/17/2009  FINDINGS: The heart size and mediastinal contours are within normal limits. Both lungs are clear. The visualized skeletal structures are unremarkable.  IMPRESSION: No active cardiopulmonary disease.   Electronically Signed   By: Curlene Dolphin M.D.   On: 10/27/2013 17:03    Other results: EKG: Pending.  Assessment & Plan by Problem: Principal Problem:   Carbon monoxide exposure Active Problems:   ASTHMA, PERSISTENT, MODERATE   COPD   Irritable bowel syndrome  Ms.Dynisha C Harkless is a 58 y.o. African American female with PMH of IBS, chronic tobacco abuse (now quit since April 2014), and asthma presenting to [facility] today for questions about arthritis medication and also reports of  recent onset  dizziness, nausea, and headache.    # Carbomonoxide Poisoning: patient reports diarrhea, history of exposure to carbon monoxide, which have been more than 48 hours ago. Labs reveal elevated with a level of 1.6, with normal oxygen saturation on room air and a normal methemoglobin. Chest x-ray negative. Her symptoms of dizziness, headache, and general fatigue, and can be caused by carbon monoxide poisoning and she was also orthostatic based on pulse rate.  Plan. -admit and observation -100% oxygen administration via nonrebreather - check lactic acid level - Check cyanide blood levels - Monitor O2 saturations - CBC and BMP unremarkable - case management for apartment safety including smoker/CO detector.  - Patient will likely be discharged tomorrow after Oxygen treatment  # Dizziness/orthostasis:  Orthostatic vital signs positive due to heart rate. BP 104/73 lying with HR 83 and BP 95/72 with HR 108 on standing. Neuro exam not very consistent seen for benign paroxysmal positional vertigo. There are no neurological focal deficits to suggest stroke.she denies any chest pains, palpitations, which makes cardiac cause of her dizziness, very unlikely. Her home medications have been reviewed and is in no possible culprits since she is not on the narcotics or benzos.  Plan -IV fluids overnight. -Regular diet. -Check orthostatics in a.m. - do EKG - may require to echocardiogram  Dispo: Disposition is deferred at this time, awaiting improvement of current medical problems. Anticipated discharge in approximately 2-3 day(s).   The patient does have a current PCP Jerene Pitch, MD), therefore will be require OPC follow-up after discharge.   The patient does not have transportation limitations that hinder transportation to clinic appointments.  Joni Reining, DO 9:08 PM

## 2013-10-27 NOTE — Progress Notes (Signed)
Subjective:   Patient ID: Aimee Davis female   DOB: 1956-06-17 58 y.o.   MRN: [id number]  HPI: Aimee Davis is a 58 y.o. African American female with PMH of IBS, chronic tobacco abuse (now quit since April 2014), and asthma presenting to [facility] today for questions about arthritis medication and also reports of recent onset dizziness, nausea, and headache.  Upon further questioning, she reports that on Sunday night 10/25/13 she was boiling some water on her gas stove in her apartment and ended up falling asleep while the stove was on.  She said she thought if she let the heat on low she would wake up in time but did not wake up for several hours. When she did wake up, it was due to a severe headache and she had fumes in her apartment from the stove.  The pan was scorched and she reports poor ventilation in her apartment as well.  Since then, she has been having on and off headaches, occasional nausea, but main complaint is dizziness especially with any movement.  She denies any similar episodes, no tinnitus, or vomiting.  She does not have blurry vision but has trouble focusing her vision when she is dizzy.  She denies syncope but does have the sensation like she will fall when she is dizzy, but has not fallen.  The only change in her medication list is starting nicorette gum ~4 months ago that she chews up to 4 times a day, but this is nothing very recent and had no dizziness prior to Sunday.    ABG was done in clinic: carboxyhemoglobin level slightly elevated at 1.6, o2 sat 97.5%. cxr neg. Orthostatic vital signs positive due to heart rate. BP 104/73 lying with HR 83 and BP 95/72 with HR 108 on standing.   We also tried calling her apartment complex ([address] [phone]) several times to have the apartment inspected for carbon monoxide as she reports no sensors went off, but unfortunately we were unable to get anyone by phone. In the meantime, I have recommended her to stay with a friend  or family member (her mom is who she chose) and return to her home once able to be properly inspected.  I also recommended for her not to drive her vehicle at this time given the severe dizziness with any rapid movements.   In regards to her question about arthritis medication, she cannot remember the exact name but says her mother takes it and she was wondering if it would help with her generalized aches at times. She thinks the name may be arthatreat? But I was unable to find such a medication online to view. She said she will call the office with the name of the medication for [place] to review.   Finally, for her constipation, she has been taking 2 teaspoons of epsom salt by mouth with water occasionally once or twice as week she claims that has been helping.   Past Medical History  Diagnosis Date  . Allergy     seasonal, PCN, antidepressants, bentyl  . Asthma   . Carpal tunnel syndrome on both sides     right worse than left 2008  . Hyperlipidemia     LDL 108 JULY 2013  . Positive TB test     From [facility] 2009 note : Recent F/u at [facility]. CXRAy negative.  Positive TST in 2007 (32mm).  Unable to tolerate INH due to hepatotoxicity   Current Outpatient Prescriptions  Medication Sig Dispense  Refill  . acetaminophen (TYLENOL) 325 MG tablet Take 1-2 tablets (325-650 mg total) by mouth every 6 (six) hours as needed for pain.  60 tablet  0  . albuterol (PROVENTIL HFA;VENTOLIN HFA) 108 (90 BASE) MCG/ACT inhaler Inhale 2 puffs into the lungs every 6 (six) hours as needed for wheezing.  1 Inhaler  6  . esomeprazole (NEXIUM) 40 MG capsule Take 1 capsule (40 mg total) by mouth daily before breakfast.  90 capsule  3  . fish oil-omega-3 fatty acids 1000 MG capsule Take by mouth daily.      . Flaxseed MISC by Does not apply route.      . Fluticasone-Salmeterol (ADVAIR DISKUS) 250-50 MCG/DOSE AEPB Inhale 1 puff into the lungs 2 (two) times daily.  1 each  3  . ibuprofen (ADVIL,MOTRIN) 400 MG tablet Take 1  tablet (400 mg total) by mouth every 6 (six) hours as needed for pain.  60 tablet  0  . loratadine (CLARITIN) 10 MG tablet Take 10 mg by mouth as needed.      . mometasone (NASONEX) 50 MCG/ACT nasal spray Place 2 sprays in each nostril once daily as needed  17 g  5  . Multiple Vitamin (MULTIVITAMIN) capsule Take 1 capsule by mouth daily.      Marland Kitchen docusate sodium (COLACE) 100 MG capsule Take 1 capsule (100 mg total) by mouth daily as needed for constipation.  30 capsule  1   No current facility-administered medications for this visit.   Family History  Problem Relation Age of Onset  . Hypertension Mother   . Hyperlipidemia Mother   . Heart disease Mother   . Diabetes Mother   . Kidney disease Mother   . Stroke Father   . Diabetes Father   . Heart disease Father   . Hyperlipidemia Father   . Hypertension Father   . Kidney disease Father   . Cancer Sister   . Mental illness Sister   . Diabetes Sister   . Kidney disease Sister   . Hypertension Sister   . Mental illness Brother   . Asthma Son   . Hypertension Maternal Aunt   . Hypertension Maternal Uncle   . Hypertension Paternal Aunt   . Kidney disease Paternal Aunt   . Hypertension Paternal Uncle   . Kidney disease Paternal Uncle    History   Social History  . Marital Status: Divorced    Spouse Name: N/A    Number of Children: N/A  . Years of Education: N/A   Social History Main Topics  . Smoking status: Former Smoker -- 0.33 packs/day    Types: Cigarettes    Quit date: 01/06/2013  . Smokeless tobacco: None  . Alcohol Use: No     Comment: QUIT 19 YEARS AGO--HEAVY USE  . Drug Use: No  . Sexual Activity: None     Comment: S/P PARTIAL HYSTERECTOMY   Other Topics Concern  . None   Social History Narrative  . None   Review of Systems:  Constitutional:  Fatigue. Denies fever, chills, diaphoresis, appetite change.   HEENT:  Denies congestion, sore throat  Respiratory:  Denies SOB, DOE, cough, and wheezing.     Cardiovascular:  Denies chest pain, palpitations, and leg swelling.   Gastrointestinal:  Nausea. Denies vomiting, abdominal pain  Genitourinary:  Denies dysuria  Musculoskeletal:  Joint pain.   Skin:  Denies pallor, rash and wound.   Neurological:  Dizziness, headaches, light-headedness.  Denies seizures, syncope, weakness   Objective:  Physical Exam: Filed Vitals:   10/27/13 1423 10/27/13 1431 10/27/13 1434 10/27/13 1438  BP:  104/73 96/72   Pulse: 90 84 91   Temp: 97.3 F (36.3 C)   98.3 F (36.8 C)  TempSrc: Oral   Oral  Height: 5\' 4"  (1.626 m)   5\' 4"  (1.626 m)  Weight: 161 lb 12.8 oz (73.392 kg)   161 lb 12.8 oz (73.392 kg)  SpO2: 99%   99%   Vitals reviewed. General: sitting in chair, NAD HEENT: PERRL, EOMI, conjunctival injection b/l Cardiac: RRR Pulm: clear to auscultation bilaterally, no wheezes, rales, or rhonchi Abd: soft, nontender, nondistended, BS present Ext: warm and well perfused, no pedal edema, +2DP B/L Neuro: alert and oriented X3, cranial nerves II-XII grossly intact, strength and sensation to light touch equal in bilateral upper and lower extremities, steady gait but reports dizziness upon standing, -romberg. Unable to do heel to shin while standing without losing balance. Struggled to find her nose on finger to nose testing due to dizziness. Reported dizziness with standing and with any head rotation.  Assessment & Plan:  Discussed with Dr. Ellwood Dense -admit to IMTS for obs for possible carbon monoxide exposure and orthostasis -cmet, cbc pending, recommended lactic acid and cyanide level as well given smoke exposure and continued symptoms

## 2013-10-27 NOTE — Assessment & Plan Note (Addendum)
Reports new onset dizziness, headaches, and nausea x2 days. Reports falling asleep in her apartment with the gas stove on while boiling water and waking up several hours later. Fumes reports in apartment, no sensors went off. carboxyhb level checked in clinic slightly elevated to 1.6%. Orthostatic vital signs positive in clinic due to heart rate.   -bmet, cbc pending, recommend adding lactic acid and checking cyanide level as well -will admit for obs IMTS -tried to contact apartment complex several times today with no answer to have apartment inspected prior to return, will hopefully be done prior to hospital discharge -recommended not to drive for now given dizziness

## 2013-10-28 NOTE — Progress Notes (Signed)
Pt placed on non-rebreather mask at 2L.

## 2013-10-28 NOTE — Progress Notes (Signed)
Case discussed with Dr. Qureshi at the time of the visit.  We reviewed the resident's history and exam and pertinent patient test results.  I agree with the assessment, diagnosis, and plan of care documented in the resident's note. 

## 2013-10-28 NOTE — Discharge Summary (Signed)
Name: Aimee Davis MRN: [id number] DOB: 04/14/1956 58 y.o. PCP: Jerene Pitch, MD  Date of Admission: 10/27/2013  6:50 PM Date of Discharge: 10/28/2013 Attending Physician: Truman Hayward, MD  Discharge Diagnosis: Principal Problem:   Carbon monoxide exposure Active Problems:   ASTHMA, PERSISTENT, MODERATE   COPD   Irritable bowel syndrome   Dizziness, nonspecific  Discharge Medications:   Medication List         acetaminophen 325 MG tablet  Commonly known as:  TYLENOL  Take 1-2 tablets (325-650 mg total) by mouth every 6 (six) hours as needed for pain.     ADVAIR DISKUS 250-50 MCG/DOSE Aepb  Generic drug:  Fluticasone-Salmeterol  Inhale 2 puffs into the lungs daily.     albuterol 108 (90 BASE) MCG/ACT inhaler  Commonly known as:  PROVENTIL HFA;VENTOLIN HFA  Inhale 2 puffs into the lungs every 6 (six) hours as needed for wheezing.     DRY EYES OP  Apply 1 drop to eye daily as needed (dry eyes).     esomeprazole 40 MG capsule  Commonly known as:  NEXIUM  Take 1 capsule (40 mg total) by mouth daily before breakfast.     fish oil-omega-3 fatty acids 1000 MG capsule  Take 2 g by mouth daily.     Flaxseed Misc  Take by mouth See admin instructions. Teaspoonful of seed blended and mixed with yogurt     ibuprofen 400 MG tablet  Commonly known as:  ADVIL,MOTRIN  Take 1 tablet (400 mg total) by mouth every 6 (six) hours as needed for pain.     loratadine 10 MG tablet  Commonly known as:  CLARITIN  Take 10 mg by mouth daily as needed for allergies.     mometasone 50 MCG/ACT nasal spray  Commonly known as:  NASONEX  Place 1 spray into both nostrils daily.     mometasone 50 MCG/ACT nasal spray  Commonly known as:  NASONEX  Place 2 sprays in each nostril once daily as needed     multivitamin capsule  Take 1 capsule by mouth daily.        Disposition and follow-up:   [name] was discharged from [facility] in Good  condition.  At the hospital follow up visit please address:  1.  Continued resolution of symptoms of HA, dizziness, and nausea.    2. Is home safe to return? Did maintance replace CO detector.  3.  Labs / imaging needed at time of follow-up: None  4.  Pending labs/ test needing follow-up: None  Follow-up Appointments:   Discharge Instructions:      Future Appointments Provider Department Dept Phone   02/05/2014 10:00 AM Aimee Davis, [facility] [phone]      Consultations:    Procedures Performed:  Dg Chest 2 View  10/27/2013   CLINICAL DATA:  Carbon monoxide exposure.  EXAM: CHEST  2 VIEW  COMPARISON:  Chest CT 03/17/2010 and chest radiograph 03/17/2009  FINDINGS: The heart size and mediastinal contours are within normal limits. Both lungs are clear. The visualized skeletal structures are unremarkable.  IMPRESSION: No active cardiopulmonary disease.   Electronically Signed   By: Curlene Dolphin M.D.   On: 10/27/2013 17:03   Admission HPI: [name] is a 58 y.o. African American female with PMH of IBS, chronic tobacco abuse (now quit since April 2014), and asthma who presented to [facility] today for questions about arthritis medication and also reports  of recent onset dizziness, nausea, and headache.  Upon further questioning, she reported that on Sunday night 10/25/13 she was boiling some water on her gas stove in her apartment and ended up falling asleep while the stove was on. She said she thought if she let the heat on low she would wake up in time but did not wake up for several hours. When she did wake up, it was due to a severe headache and she had fumes in her apartment from the stove. The pan was scorched and she reports poor ventilation in her apartment as well. Since then, she has been having on and off headaches, occasional nausea, but main complaint is dizziness especially with any movement. She denies any similar episodes, no tinnitus,  or vomiting. She does not have blurry vision but has trouble focusing her vision when she is dizzy. She denies syncope but does have the sensation like she will fall when she is dizzy, but has not fallen. The only change in her medication list is starting nicorette gum ~4 months ago that she chews up to 4 times a day, but this is nothing very recent and had no dizziness prior to Sunday.  ABG was done in clinic: carboxyhemoglobin level slightly elevated at 1.6, o2 sat 97.5%. cxr neg. Orthostatic vital signs positive due to heart rate. BP 104/73 lying with HR 83 and BP 95/72 with HR 108 on standing.    Hospital Course by problem list:    Carbon monoxide exposure - Patient was admitted to [facility] for observation after presenting to PCP office with complaints of persistent HA, dizziness, and nausea after having likely carbon monoxide exposure (left stove on in unventilated apartment overnight).  Her presenting labs showed a slightly elevated carboxyhemoglobin at 1.6 (in a non smoker) with O2 saturation of 97.5%.  Most concerning was that her symptoms had failed to improve despite her presentation 1 day after carbon monoxide exposure.  She was placed on high flow oxygen via face mask and monitored overnight.  In the morning she reported her symptoms were almost completely resolved.  Case management notified building maintainece to evaluate her apartment and CO detector.  She was agreeable to stay with her mother until her apartment could be inspected.  A Cyanide level was not checked as it was thought to be of low yield as she had no history of ingestion and no burning of material likely to contain cyanide, or would the test have resulted in time to change clinical course.   Discharge Vitals:   BP 124/76  Pulse 64  Temp(Src) 98.1 F (36.7 C) (Oral)  Resp 16  Ht 5\' 3"  (1.6 m)  Wt 161 lb 12.8 oz (73.392 kg)  BMI 28.67 kg/m2  SpO2 99%  Discharge Labs:  Results for orders placed during the hospital  encounter of 10/27/13 (from the past 24 hour(s))  LACTIC ACID, PLASMA     Status: None   Collection Time    10/27/13  8:38 PM      Result Value Ref Range   Lactic Acid, Venous 0.9  0.5 - 2.2 mmol/L    Signed: Joni Reining, DO 10/28/2013, 1:31 PM   Time Spent on Discharge: 20 minutes Services Ordered on Discharge: None Equipment Ordered on Discharge: None

## 2013-10-28 NOTE — H&P (Addendum)
  Date: 10/28/2013  Patient name: Aimee Davis  Medical record number: [id number]  Date of birth: 1956/07/10   I have seen and evaluated Aimee Davis and discussed their care with the Residency Team.   Assessment and Plan: I have seen and evaluated the patient as outlined above. I agree with the formulated Assessment and Plan as detailed in the residents' admission note, with the following changes:   58 year old with CO poisoning due to leaving her gas furnace on. Her CO level was only 1.6 and she has done well with high flow O2  I DO worry about safety of her home where CO detector did not fire. She has left home with windows and door closed but fortunately furnace off. Would need to make sure that she can safely return home., if not she can stay with a relative. I do not think she needs to stay in the hospital further or have her ABG checked again.   We should have rechecked her HIV ab (she was negative in 09/2012 though)  Truman Hayward, MD 2/11/20151:54 PM

## 2013-10-28 NOTE — Progress Notes (Signed)
Subjective: Patient received some high flow O2 overnight, however currently has mask off eating breakfast.  She reports her HA has almost completely resolved and she feels much less "swimy."  Overall she feels much better and reports she can stay with her mother if needed until her apartment is checked out. Objective: Vital signs in last 24 hours: Filed Vitals:   10/28/13 0509 10/28/13 0510 10/28/13 0511 10/28/13 1313  BP: 112/71 112/77 91/64 124/76  Pulse: 62 96 85 64  Temp:    98.1 F (36.7 C)  TempSrc:    Oral  Resp:      Height:      Weight:      SpO2:    99%   Weight change:   Intake/Output Summary (Last 24 hours) at 10/28/13 1330 Last data filed at 10/28/13 1105  Gross per 24 hour  Intake 1349.17 ml  Output      0 ml  Net 1349.17 ml   General: resting in bed, pleasant HEENT: EOMI, no scleral icterus Cardiac: RRR, no rubs, murmurs or gallops Pulm: clear to auscultation bilaterally, moving normal volumes of air Abd: soft, nontender, nondistended, BS present Ext: warm and well perfused, no pedal edema Neuro: alert and oriented X4, cranial nerves II-XII grossly intact, normal finger to nose testing.  Lab Results: Basic Metabolic Panel:  Recent Labs Lab 10/27/13 1557  NA 142  K 5.1  CL 104  CO2 26  GLUCOSE 106*  BUN 18  CREATININE 0.86  CALCIUM 9.8   Liver Function Tests:  Recent Labs Lab 10/27/13 1557  AST 23  ALT 20  ALKPHOS 61  BILITOT 0.3  PROT 6.7  ALBUMIN 3.8   CBC:  Recent Labs Lab 10/27/13 1557  WBC 3.7*  NEUTROABS 2.0  HGB 13.0  HCT 38.8  MCV 81.9  PLT 242    Micro Results: No results found for this or any previous visit (from the past 240 hour(s)). Studies/Results: Dg Chest 2 View  10/27/2013   CLINICAL DATA:  Carbon monoxide exposure.  EXAM: CHEST  2 VIEW  COMPARISON:  Chest CT 03/17/2010 and chest radiograph 03/17/2009  FINDINGS: The heart size and mediastinal contours are within normal limits. Both lungs are clear. The  visualized skeletal structures are unremarkable.  IMPRESSION: No active cardiopulmonary disease.   Electronically Signed   By: Curlene Dolphin M.D.   On: 10/27/2013 17:03   Medications: I have reviewed the patient's current medications. Scheduled Meds: . enoxaparin (LOVENOX) injection  40 mg Subcutaneous Q24H  . mometasone-formoterol  2 puff Inhalation BID  . pantoprazole  80 mg Oral Q1200   Continuous Infusions:  PRN Meds:.albuterol Assessment/Plan: Carbon monoxide exposure - Symptoms greatly improved with high flow O2.  Case manager has helped in contacted apartment complex to check out apartment, [place]. - Do not suspect cyanide exposure (no ingestions, no house fire, no buring of material likely to contain cyanide). Do not need Cyanide level as is not clinically useful and patient has improved. -Patient stable for discharge home to mother's house.    Dispo: Discharge today, patient will stay at mothers house, apartment maintenance has been contacted and will check out apartment and CO monitoring.  The patient does have a current PCP Jerene Pitch, MD) and does need an [facility] hospital follow-up appointment after discharge.  The patient does not have transportation limitations that hinder transportation to clinic appointments.  .Services Needed at time of discharge: Y = Yes, Blank = No PT:   OT:  RN:   Equipment:   Other:     LOS: 1 day   Joni Reining, DO 10/28/2013, 1:30 PM

## 2013-10-28 NOTE — Progress Notes (Signed)
NURSING PROGRESS NOTE  Aimee Davis [id number] Discharge Data: 10/28/2013 3:16 PM Attending Provider: Truman Hayward, MD [id number], [name], MD     [name] to be D/C'd Home per MD order.  Discussed with the patient the After Visit Summary and all questions fully answered. All IV's discontinued with no bleeding noted. All belongings returned to patient for patient to take home.   Last Vital Signs:  Blood pressure 124/76, pulse 64, temperature 98.1 F (36.7 C), temperature source Oral, resp. rate 16, height 5\' 3"  (1.6 m), weight 73.392 kg (161 lb 12.8 oz), SpO2 99.00%.  Discharge Medication List   Medication List         acetaminophen 325 MG tablet  Commonly known as:  TYLENOL  Take 1-2 tablets (325-650 mg total) by mouth every 6 (six) hours as needed for pain.     ADVAIR DISKUS 250-50 MCG/DOSE Aepb  Generic drug:  Fluticasone-Salmeterol  Inhale 2 puffs into the lungs daily.     albuterol 108 (90 BASE) MCG/ACT inhaler  Commonly known as:  PROVENTIL HFA;VENTOLIN HFA  Inhale 2 puffs into the lungs every 6 (six) hours as needed for wheezing.     DRY EYES OP  Apply 1 drop to eye daily as needed (dry eyes).     esomeprazole 40 MG capsule  Commonly known as:  NEXIUM  Take 1 capsule (40 mg total) by mouth daily before breakfast.     fish oil-omega-3 fatty acids 1000 MG capsule  Take 2 g by mouth daily.     Flaxseed Misc  Take by mouth See admin instructions. Teaspoonful of seed blended and mixed with yogurt     ibuprofen 400 MG tablet  Commonly known as:  ADVIL,MOTRIN  Take 1 tablet (400 mg total) by mouth every 6 (six) hours as needed for pain.     loratadine 10 MG tablet  Commonly known as:  CLARITIN  Take 10 mg by mouth daily as needed for allergies.     mometasone 50 MCG/ACT nasal spray  Commonly known as:  NASONEX  Place 1 spray into both nostrils daily.     mometasone 50 MCG/ACT nasal spray  Commonly known as:  NASONEX  Place 2 sprays in each  nostril once daily as needed     multivitamin capsule  Take 1 capsule by mouth daily.

## 2013-10-28 NOTE — Progress Notes (Signed)
Physician made aware of cyanide level not being drawn. Lab state not able to draw that lab. Physician will work with lab to solve this issue.

## 2013-10-28 NOTE — Care Management Note (Signed)
    Page 1 of 1   10/28/2013     2:25:47 PM   CARE MANAGEMENT NOTE 10/28/2013  Patient:  Aimee Davis, Aimee Davis   Account Number:  [id number]  Date Initiated:  10/28/2013  Documentation initiated by:  Tomi Bamberger  Subjective/Objective Assessment:   dx carbon monoxide exposure  admit as observation- pta indep.     Action/Plan:   Anticipated DC Date:  10/28/2013   Anticipated DC Plan:  [place]  CM consult      Choice offered to / List presented to:             Status of service:  Completed, signed off Medicare Important Message given?   (If response is "NO", the following Medicare IM given date fields will be blank) Date Medicare IM given:   Date Additional Medicare IM given:    Discharge Disposition:  HOME/SELF CARE  Per UR Regulation:  Reviewed for med. necessity/level of care/duration of stay  If discussed at [place] of Stay Meetings, dates discussed:    Comments:  10/28/13 [place], BSN [phone] patient lives in [place], [place] asked patient if it was ok to call her apt manager to have her carbon monxide detector checked, patient stated " yes it is fine to call them", she gave NCM the phone number for the office [phone] and maitenance [phone].  The office is closed on Wed after 12 pm, NCM spoke with Quintella Baton at the maintenance phone number, she states she will have someone go ckeck out patient's carbon monoxide detector but she could not gurantee someone would be out today to check it. Patient states she will go stay with her mom when she is dc from the hospital, and she will check on to make sure this has been checked out before she goes back to the apartment.

## 2013-11-01 NOTE — Discharge Summary (Signed)
  Date: 11/01/2013  Patient name: Aimee Davis  Medical record number: [id number]  Date of birth: 1956/01/10   This patient has been seen and the plan of care was discussed with the house staff. Please see their note for complete details. I concur with their findings with the following additions/corrections:  Patient continued to improve from CO poisoning with high flow O2 (which she did not want to take anymore on following am).  Truman Hayward, MD 11/01/2013, 7:50 PM

## 2013-11-03 ENCOUNTER — Encounter: Payer: No Typology Code available for payment source | Admitting: Internal Medicine

## 2013-11-06 ENCOUNTER — Encounter: Payer: Self-pay | Admitting: Internal Medicine

## 2013-11-06 ENCOUNTER — Ambulatory Visit (INDEPENDENT_AMBULATORY_CARE_PROVIDER_SITE_OTHER): Payer: No Typology Code available for payment source | Admitting: Internal Medicine

## 2013-11-06 VITALS — BP 114/74 | HR 67 | Temp 97.7°F | Wt 165.3 lb

## 2013-11-06 DIAGNOSIS — R42 Dizziness and giddiness: Secondary | ICD-10-CM

## 2013-11-06 NOTE — Patient Instructions (Addendum)
Thank you for your visit.  Please make sure to get up from lying/sitting position to standing position slowly. Please return to clinic if you have another carbon monoxide exposure.   Follow up with your PCP in 2-3 months.

## 2013-11-06 NOTE — Assessment & Plan Note (Addendum)
Patient with some continued dizziness, though this is improving since her hospital discharge. Unclear etiology at this time. She is not orthostatic by either HR or BP today in clinic, so this is unlikely causing the dizziness. Dix Hallpike maneuver was negative, and patient does not endorse a room spinning nature to the dizziness, so I do not believe her dizziness represents BPPV or other vertigo. It is possible that patient has some residual CO in her apartment causing some continued symptoms, though her new CO detector in her apartment has not been going off. Since her symptoms seem to be improving, we will continue to observe. I asked patient to return to clinic if her symptoms do not resolve or if they worsen. Otherwise, she will follow up with PCP within the next 2 months for health care maintenance issues such as pap smear.

## 2013-11-06 NOTE — Progress Notes (Signed)
Patient ID: Aimee Davis, female   DOB: [birth year], 58 y.o.   MRN: [id number] HPI The patient is a 58 y.o. female with a history of asthma, COPD, GERD, depression who presents for hospital follow up visit.  Patient was hospitalized 2/10-2/11 after having carbon monoxide exposure at home after leaving her stove on overnight in a poorly ventilated apartment. Patient had HA, dizziness and nausea upon admission to the hospital. She was treated with high flow oxygen (CarboxyHb level found to be mildly elevated at 1.6). Her symptoms had almost completely resolved at time of her discharge only with some residual dizziness.  Patient reports that since her discharge her dizziness has significantly improved, though has not completely resolved. She becomes dizzy primarily when she bends over to pick something up. She denies dizziness when she lies flat or moves her head side to side. Denies all other associated symptoms such as SOB, palpitations, HA, chest pain, nausea, diaphoresis. Her apartment maintenance came to install a new CO detector in her apartment.   Of note, she was orthostatic by HR upon admission.  ROS: General: no fevers, chills Skin: no rash HEENT: no blurry vision, HA, sore throat Pulm: no dyspnea, coughing, wheezing CV: no chest pain, palpitations, shortness of breath Abd: no abdominal pain, nausea/vomiting, diarrhea/constipation GU: no dysuria Ext: no arthralgias, myalgias Neuro: + dizziness; no weakness, numbness, or tingling  Filed Vitals:   11/06/13 1338  BP: 125/73  Pulse: 67  Temp: 97.7 F (36.5 C)  SpO2 99% Wt 165lbs  Physical Exam General: alert, cooperative, and in no apparent distress HEENT: pupils equal round and reactive to light, vision grossly intact, oropharynx clear and non-erythematous  Neck: supple Lungs: clear to ascultation bilaterally, normal work of respiration Heart: regular rate and rhythm, no murmurs, gallops, or rubs Abdomen: soft, non-tender,  non-distended, normal bowel sounds Extremities: warm b/l, no pedal edema Neurologic: alert & oriented X3, cranial nerves II-XII intact, strength 5/5 throughout, sensation intact to light touch; negative Dix Hallpike maneuver   Current Outpatient Prescriptions on File Prior to Visit  Medication Sig Dispense Refill  . albuterol (PROVENTIL HFA;VENTOLIN HFA) 108 (90 BASE) MCG/ACT inhaler Inhale 2 puffs into the lungs every 6 (six) hours as needed for wheezing.  1 Inhaler  6  . Artificial Tear Ointment (DRY EYES OP) Apply 1 drop to eye daily as needed (dry eyes).      Marland Kitchen esomeprazole (NEXIUM) 40 MG capsule Take 1 capsule (40 mg total) by mouth daily before breakfast.  90 capsule  3  . fish oil-omega-3 fatty acids 1000 MG capsule Take 2 g by mouth daily.       . Flaxseed MISC Take by mouth See admin instructions. Teaspoonful of seed blended and mixed with yogurt      . Fluticasone-Salmeterol (ADVAIR DISKUS) 250-50 MCG/DOSE AEPB Inhale 2 puffs into the lungs daily.      Marland Kitchen ibuprofen (ADVIL,MOTRIN) 400 MG tablet Take 1 tablet (400 mg total) by mouth every 6 (six) hours as needed for pain.  60 tablet  0  . loratadine (CLARITIN) 10 MG tablet Take 10 mg by mouth daily as needed for allergies.       . mometasone (NASONEX) 50 MCG/ACT nasal spray Place 2 sprays in each nostril once daily as needed  17 g  5  . Multiple Vitamin (MULTIVITAMIN) capsule Take 1 capsule by mouth daily.      Marland Kitchen acetaminophen (TYLENOL) 325 MG tablet Take 1-2 tablets (325-650 mg total) by mouth every 6 (  six) hours as needed for pain.  60 tablet  0   No current facility-administered medications on file prior to visit.    Assessment/Plan

## 2013-11-25 ENCOUNTER — Other Ambulatory Visit: Payer: Self-pay | Admitting: *Deleted

## 2013-11-25 MED ORDER — FLUTICASONE-SALMETEROL 250-50 MCG/DOSE IN AEPB: 2.0000 | INHALATION_SPRAY | Freq: Every day | RESPIRATORY_TRACT | Status: DC

## 2013-11-26 NOTE — Telephone Encounter (Signed)
Called to pharm 

## 2013-12-02 ENCOUNTER — Other Ambulatory Visit: Payer: Self-pay | Admitting: Internal Medicine

## 2013-12-02 ENCOUNTER — Telehealth: Payer: Self-pay | Admitting: *Deleted

## 2013-12-02 MED ORDER — FLUTICASONE-SALMETEROL 250-50 MCG/DOSE IN AEPB: 1.0000 | INHALATION_SPRAY | Freq: Two times a day (BID) | RESPIRATORY_TRACT | Status: DC

## 2013-12-02 NOTE — Telephone Encounter (Signed)
Please adjust to 1 puff bid.  Thank you,  Dr q

## 2013-12-02 NOTE — Telephone Encounter (Signed)
Pharmacy calls and questions dose of advair. Directions at this time on med list and since disch from hospital in feb are 2 puffs into lungs daily, before admission directions were 1 puff twice daily. What should the correct dose be? This question is from pharmacist and company supplying the advair

## 2013-12-11 ENCOUNTER — Ambulatory Visit (INDEPENDENT_AMBULATORY_CARE_PROVIDER_SITE_OTHER): Payer: No Typology Code available for payment source | Admitting: Internal Medicine

## 2013-12-11 ENCOUNTER — Encounter: Payer: Self-pay | Admitting: Internal Medicine

## 2013-12-11 VITALS — BP 112/74 | HR 71 | Temp 98.1°F | Ht 63.0 in | Wt 165.3 lb

## 2013-12-11 DIAGNOSIS — K589 Irritable bowel syndrome without diarrhea: Secondary | ICD-10-CM

## 2013-12-11 MED ORDER — HYOSCYAMINE SULFATE 0.125 MG SL SUBL: 0.1250 mg | SUBLINGUAL_TABLET | SUBLINGUAL | Status: DC | PRN

## 2013-12-11 NOTE — Patient Instructions (Signed)
Please Take hyoscyamine as directed for cramping. Please follow up with Dr. Carlis Abbott  Please bring your medicines with you each time you come.   Medicines may be  Eye drops  Herbal   Vitamins  Pills  Seeing these help [place] take care of you.  Hyoscyamine sublingual tablet What is this medicine? HYOSCYAMINE (hye oh SYE a meen) is used to treat stomach and bladder problems. This medicine is also used for rhinitis, to reduce some problems caused by Parkinson's disease, and for the treatment of poisoning with drugs that are usually used to treat myasthenia gravis. This medicine may be used for other purposes; ask your health care provider or pharmacist if you have questions. COMMON BRAND NAME(S): A-Spas S/L, HyoMax-SL, Hyosol SL, Levsin SL, OSCIMIN , Symax What should I tell my health care provider before I take this medicine? They need to know if you have any of these conditions: -difficulty passing urine -glaucoma -heart disease, or previous heart attack -myasthenia gravis -prostate trouble -stomach obstruction -ulcerative colitis -an unusual or allergic reaction to hyoscyamine, other medicines, foods, dyes, or preservatives -pregnant or trying to get pregnant -breast-feeding How should I use this medicine? Take this medicine by mouth. Follow the directions on the prescription label. These tablets may be placed under the tongue, and allowed to dissolve, swallowed whole, or chewed. Take your doses at regular intervals. Do not take your medicine more often than directed. Talk to your pediatrician regarding the use of this medicine in children. Special care may be needed. While this medicine may be prescribed for children as young as 12 years for selected conditions, precautions do apply. Overdosage: If you think you have taken too much of this medicine contact a poison control center or emergency room at once. NOTE: This medicine is only for you. Do not share this medicine with  others. What if I miss a dose? If you miss a dose, take it as soon as you can. If it is almost time for your next dose, take only that dose. Do not take double or extra doses. What may interact with this medicine? -amantadine -antacids -benztropine -donepezil -galantamine -medicines for hay fever and other allergies -medicines for mental depression -medicines for mental problems or psychotic disturbances -rivastigmine -tacrine This list may not describe all possible interactions. Give your health care provider a list of all the medicines, herbs, non-prescription drugs, or dietary supplements you use. Also tell them if you smoke, drink alcohol, or use illegal drugs. Some items may interact with your medicine. What should I watch for while using this medicine? You may get dizzy. Do not drive, use machinery, or do anything that needs mental alertness until you know how this medicine affects you. To reduce the risk of dizzy or fainting spells, do not sit or stand up quickly, especially if you are an older patient. Alcohol can make you more dizzy. Avoid alcoholic drinks. Stay out of bright light and wear sunglasses if this medicine makes your eyes more sensitive to light. Your mouth may get dry. Chewing sugarless gum or sucking hard candy, and drinking plenty of water may help. Contact your doctor if the problem does not go away or is severe. This medicine may cause dry eyes and blurred vision. If you wear contact lenses you may feel some discomfort. Lubricating drops may help. See your eye doctor if the problem does not go away or is severe. Avoid extreme heat (e.g., hot tubs, saunas). This medicine can cause you to sweat less than  normal. Your body temperature could increase to dangerous levels, which may lead to heat stroke. What side effects may I notice from receiving this medicine? Side effects that you should report to your doctor or health care professional as soon as possible: -anxiety,  nervousness -confusion -dizziness or fainting spells -fast heartbeat -fever -pain or difficulty passing urine -unusually weak or tired -vomiting Side effects that usually do not require medical attention (report to your doctor or health care professional if they continue or are bothersome): -altered taste -constipation -nausea This list may not describe all possible side effects. Call your doctor for medical advice about side effects. You may report side effects to FDA at 1-800-FDA-1088. Where should I keep my medicine? Keep out of the reach of children. Store at room temperature between 15 and 30 degrees C (59 and 86 degrees F). Throw away any unused medicine after the expiration date. NOTE: This sheet is a summary. It may not cover all possible information. If you have questions about this medicine, talk to your doctor, pharmacist, or health care provider.  2014, Elsevier/Gold Standard. (2008-01-19 14:37:10)

## 2013-12-11 NOTE — Progress Notes (Signed)
[facility] Subjective:   Patient ID: Aimee Davis female   DOB: [birth year] 58 y.o.   MRN: [id number]  HPI: [name] is a 58 y.o. female with a PMH significant for Irritable bowel syndrome, GERD.  She follows with Aimee Davis- GI at [place]. She presents today with complaints of abdominal pain.  She reports she has crampy abdominal pain that only happens in the morning. She reports when this happens she cannot get out of bed until it "slows down."  It usually happens at around 5 am. The pain usually resolves after taking nexium, drinking tea and taking a shower.  She reports this has been going on lately every morning for the last month.  She reports she last saw Aimee Davis over a year ago.    Past Medical History  Diagnosis Date  . Allergy     seasonal, PCN, antidepressants, bentyl  . Asthma   . Carpal tunnel syndrome on both sides     right worse than left 2008  . Hyperlipidemia     LDL 108 JULY 2013  . Positive TB test     From [facility] 2009 note : Recent F/u at [facility]. CXRAy negative.  Positive TST in 2007 (30mm).  Unable to tolerate INH due to hepatotoxicity  . COPD (chronic obstructive pulmonary disease)   . Anemia   . GERD (gastroesophageal reflux disease)   . Chronic lower back pain    Current Outpatient Prescriptions  Medication Sig Dispense Refill  . acetaminophen (TYLENOL) 325 MG tablet Take 1-2 tablets (325-650 mg total) by mouth every 6 (six) hours as needed for pain.  60 tablet  0  . albuterol (PROVENTIL HFA;VENTOLIN HFA) 108 (90 BASE) MCG/ACT inhaler Inhale 2 puffs into the lungs every 6 (six) hours as needed for wheezing.  1 Inhaler  6  . Artificial Tear Ointment (DRY EYES OP) Apply 1 drop to eye daily as needed (dry eyes).      Marland Kitchen esomeprazole (NEXIUM) 40 MG capsule Take 1 capsule (40 mg total) by mouth daily before breakfast.  90 capsule  3  . fish oil-omega-3 fatty acids 1000 MG capsule Take 2 g by mouth daily.       . Flaxseed MISC  Take by mouth See admin instructions. Teaspoonful of seed blended and mixed with yogurt      . Fluticasone-Salmeterol (ADVAIR DISKUS) 250-50 MCG/DOSE AEPB Inhale 1 puff into the lungs 2 (two) times daily.  60 each  4  . hyoscyamine (LEVSIN SL) 0.125 MG SL tablet Place 1 tablet (0.125 mg total) under the tongue every 4 (four) hours as needed for cramping.  30 tablet  1  . ibuprofen (ADVIL,MOTRIN) 400 MG tablet Take 1 tablet (400 mg total) by mouth every 6 (six) hours as needed for pain.  60 tablet  0  . loratadine (CLARITIN) 10 MG tablet Take 10 mg by mouth daily as needed for allergies.       . mometasone (NASONEX) 50 MCG/ACT nasal spray Place 2 sprays in each nostril once daily as needed  17 g  5  . Multiple Vitamin (MULTIVITAMIN) capsule Take 1 capsule by mouth daily.       No current facility-administered medications for this visit.   Family History  Problem Relation Age of Onset  . Hypertension Mother   . Hyperlipidemia Mother   . Heart disease Mother   . Diabetes Mother   . Kidney disease Mother   . Stroke Father   .  Diabetes Father   . Heart disease Father   . Hyperlipidemia Father   . Hypertension Father   . Kidney disease Father   . Cancer Sister   . Mental illness Sister   . Diabetes Sister   . Kidney disease Sister   . Hypertension Sister   . Mental illness Brother   . Asthma Son   . Hypertension Maternal Aunt   . Hypertension Maternal Uncle   . Hypertension Paternal Aunt   . Kidney disease Paternal Aunt   . Hypertension Paternal Uncle   . Kidney disease Paternal Uncle    History   Social History  . Marital Status: Divorced    Spouse Name: N/A    Number of Children: N/A  . Years of Education: N/A   Social History Main Topics  . Smoking status: Former Smoker -- 0.33 packs/day for 43 years    Types: Cigarettes    Quit date: 01/06/2013  . Smokeless tobacco: Never Used  . Alcohol Use: Yes     Comment: 10/27/2013 "quit drinking alcohol 04/27/1993"  . Drug Use:  Yes    Special: Cocaine, Marijuana     Comment: 10/27/2013 "quit using drugs 04/27/1993"  . Sexual Activity: No     Comment: S/P PARTIAL HYSTERECTOMY   Other Topics Concern  . None   Social History Narrative  . None   Review of Systems: Review of Systems  Constitutional: Negative for fever and chills.  Eyes: Negative for blurred vision.  Gastrointestinal: Positive for heartburn, abdominal pain, diarrhea (alternating diarrhea and constipation) and constipation. Negative for nausea, vomiting, blood in stool and melena.  Genitourinary: Negative for dysuria.  Musculoskeletal: Negative for back pain.  Neurological: Negative for dizziness and headaches.     Objective:  Physical Exam: Filed Vitals:   12/11/13 1416  BP: 112/74  Pulse: 71  Temp: 98.1 F (36.7 C)  TempSrc: Oral  Height: 5\' 3"  (1.6 m)  Weight: 165 lb 4.8 oz (74.98 kg)  SpO2: 100%  Physical Exam  Nursing note and vitals reviewed. Constitutional: She is well-developed, well-nourished, and in no distress.  HENT:  Head: Normocephalic and atraumatic.  Cardiovascular: Normal rate and regular rhythm.   Pulmonary/Chest: Effort normal and breath sounds normal.  Abdominal: Soft. She exhibits no distension. There is tenderness (diffuse mild tenderness to palpation). There is no rebound and no guarding.  Hyperactive bowel sounds     Assessment & Plan:  Case discussed with Dr. Lynnae January See Problem Based Assessment and Plan Medications Ordered Meds ordered this encounter  Medications  . hyoscyamine (LEVSIN SL) 0.125 MG SL tablet    Sig: Place 1 tablet (0.125 mg total) under the tongue every 4 (four) hours as needed for cramping.    Dispense:  30 tablet    Refill:  1   Other Orders Orders Placed This Encounter  Procedures  . Ambulatory referral to Gastroenterology    Referral Priority:  Routine    Referral Type:  Consultation    Referral Reason:  Specialty Services Required    Requested Specialty:  Gastroenterology     Number of Visits Requested:  1

## 2013-12-12 NOTE — Assessment & Plan Note (Signed)
Did not tolerate bentyl in past. -Will try Hyoscyamine 0.125mg  SL tablet  (prescribed #30 for initial trial given financial concerns) - Encouraged patient to follow up with Dr. Carlis Abbott as she was supposed to have a follow up colonoscopy, given her last visit was over 1 year ago will place GI referral to help facilitate process.

## 2013-12-14 NOTE — Progress Notes (Signed)
Case discussed with Dr. Hoffman at the time of the visit.  We reviewed the resident's history and exam and pertinent patient test results.  I agree with the assessment, diagnosis, and plan of care documented in the resident's note. 

## 2013-12-21 ENCOUNTER — Other Ambulatory Visit: Payer: Self-pay | Admitting: Internal Medicine

## 2013-12-21 DIAGNOSIS — Z1231 Encounter for screening mammogram for malignant neoplasm of breast: Secondary | ICD-10-CM

## 2013-12-23 ENCOUNTER — Ambulatory Visit (HOSPITAL_COMMUNITY): Payer: No Typology Code available for payment source

## 2013-12-25 ENCOUNTER — Ambulatory Visit (HOSPITAL_COMMUNITY)
Admission: RE | Admit: 2013-12-25 | Discharge: 2013-12-25 | Disposition: A | Discharge status: Home / Self Care | Payer: No Typology Code available for payment source | Source: Ambulatory Visit | Attending: Internal Medicine | Admitting: Internal Medicine

## 2013-12-25 DIAGNOSIS — Z1231 Encounter for screening mammogram for malignant neoplasm of breast: Secondary | ICD-10-CM

## 2014-02-05 ENCOUNTER — Ambulatory Visit: Payer: No Typology Code available for payment source | Attending: Internal Medicine | Admitting: Audiology

## 2014-02-05 DIAGNOSIS — M25569 Pain in unspecified knee: Secondary | ICD-10-CM | POA: Insufficient documentation

## 2014-02-05 DIAGNOSIS — IMO0001 Reserved for inherently not codable concepts without codable children: Secondary | ICD-10-CM | POA: Insufficient documentation

## 2014-02-05 DIAGNOSIS — M545 Low back pain, unspecified: Secondary | ICD-10-CM | POA: Insufficient documentation

## 2014-02-09 ENCOUNTER — Encounter: Payer: Self-pay | Admitting: Internal Medicine

## 2014-02-09 ENCOUNTER — Ambulatory Visit (INDEPENDENT_AMBULATORY_CARE_PROVIDER_SITE_OTHER): Payer: No Typology Code available for payment source | Admitting: Internal Medicine

## 2014-02-09 VITALS — BP 103/72 | HR 76 | Temp 97.7°F | Ht 64.0 in | Wt 159.3 lb

## 2014-02-09 DIAGNOSIS — R5381 Other malaise: Secondary | ICD-10-CM

## 2014-02-09 DIAGNOSIS — R5383 Other fatigue: Secondary | ICD-10-CM

## 2014-02-09 DIAGNOSIS — R21 Rash and other nonspecific skin eruption: Secondary | ICD-10-CM | POA: Insufficient documentation

## 2014-02-09 DIAGNOSIS — K589 Irritable bowel syndrome without diarrhea: Secondary | ICD-10-CM

## 2014-02-09 MED ORDER — VALACYCLOVIR HCL 1 G PO TABS: 1000.0000 mg | ORAL_TABLET | Freq: Three times a day (TID) | ORAL | Status: DC

## 2014-02-09 MED ORDER — DOXYCYCLINE HYCLATE 50 MG PO CAPS: 50.0000 mg | ORAL_CAPSULE | Freq: Two times a day (BID) | ORAL | Status: DC

## 2014-02-09 MED ORDER — DOXYCYCLINE HYCLATE 100 MG PO TABS: 100.0000 mg | ORAL_TABLET | Freq: Two times a day (BID) | ORAL | Status: AC

## 2014-02-09 MED ORDER — DOXYCYCLINE HYCLATE 100 MG PO TABS: 100.0000 mg | ORAL_TABLET | Freq: Two times a day (BID) | ORAL | Status: DC

## 2014-02-09 MED ORDER — VALACYCLOVIR HCL 1 G PO TABS: 1000.0000 mg | ORAL_TABLET | Freq: Three times a day (TID) | ORAL | Status: AC

## 2014-02-09 NOTE — Assessment & Plan Note (Signed)
Needs to reschedule GI appointment as she will be out of town in June.

## 2014-02-09 NOTE — Progress Notes (Signed)
Subjective:   Patient ID: Aimee Davis female   DOB: 26-Mar-1956 58 y.o.   MRN: [id number]  HPI: Aimee Davis is a 58 y.o. African American female with PMH of IBS, former smoker (quit since April 2014), and asthma presenting to [facility] today for routine follow up visit. She was last seen in opc by Dr. Heber Perry for IBS and started on Hyoscyamine and recommended to follow up with GI, Dr. Carlis Abbott at [place], she reports feeling tired with no energy, worse over the past 2-3 weeks. Aimee Davis does have an intermittent productive cough with clear-pale phlegm lately and subjective slight fever and occasional chills with sweating at night time but denies her clothes or pillows being covered in sweat.  She has not followed up with GI yet, but has an appointment in June and is due for a colonoscopy but will need to reschedule due to being out of town.  Of note, her latest lab work did not show anemia and TSH was wnl.  She has quit smoking since 12/2013, xray in February showed clear lungs, and weight is down from March 6 pounds but is the same from September 2014.    Her main complaints today is acute onset R knee pain and ?ant bites.  She reports noticing severe discomfort late Monday and today with severe pain and itching on the right knee to the point that she often needs to change positions due to pain and even slight clothing over the knee causes pain.  She was working outdoors on Monday and exposed to ants but does not recall them being on her legs.  She denies any prior similar episodes and no trauma to the area.  She has some relief with ibuprofen and applying a home made past of baking soda, toothpaste, and vinegar.  She claims the bumps on her legs first looked like small blisters and are now gray in color and painful. Cortisone has provided mild itching relief.    Past Medical History  Diagnosis Date  . Allergy     seasonal, PCN, antidepressants, bentyl  . Asthma   . Carpal tunnel  syndrome on both sides     right worse than left 2008  . Hyperlipidemia     LDL 108 JULY 2013  . Positive TB test     From [facility] 2009 note : Recent F/u at [facility]. CXRAy negative.  Positive TST in 2007 (25mm).  Unable to tolerate INH due to hepatotoxicity  . COPD (chronic obstructive pulmonary disease)   . Anemia   . GERD (gastroesophageal reflux disease)   . Chronic lower back pain    Current Outpatient Prescriptions  Medication Sig Dispense Refill  . acetaminophen (TYLENOL) 325 MG tablet Take 1-2 tablets (325-650 mg total) by mouth every 6 (six) hours as needed for pain.  60 tablet  0  . albuterol (PROVENTIL HFA;VENTOLIN HFA) 108 (90 BASE) MCG/ACT inhaler Inhale 2 puffs into the lungs every 6 (six) hours as needed for wheezing.  1 Inhaler  6  . Artificial Tear Ointment (DRY EYES OP) Apply 1 drop to eye daily as needed (dry eyes).      Marland Kitchen esomeprazole (NEXIUM) 40 MG capsule Take 1 capsule (40 mg total) by mouth daily before breakfast.  90 capsule  3  . fish oil-omega-3 fatty acids 1000 MG capsule Take 2 g by mouth daily.       . Flaxseed MISC Take by mouth See admin instructions. Teaspoonful of seed blended and  mixed with yogurt      . Fluticasone-Salmeterol (ADVAIR DISKUS) 250-50 MCG/DOSE AEPB Inhale 1 puff into the lungs 2 (two) times daily.  60 each  4  . hyoscyamine (LEVSIN SL) 0.125 MG SL tablet Place 1 tablet (0.125 mg total) under the tongue every 4 (four) hours as needed for cramping.  30 tablet  1  . ibuprofen (ADVIL,MOTRIN) 400 MG tablet Take 1 tablet (400 mg total) by mouth every 6 (six) hours as needed for pain.  60 tablet  0  . loratadine (CLARITIN) 10 MG tablet Take 10 mg by mouth daily as needed for allergies.       . mometasone (NASONEX) 50 MCG/ACT nasal spray Place 2 sprays in each nostril once daily as needed  17 g  5  . Multiple Vitamin (MULTIVITAMIN) capsule Take 1 capsule by mouth daily.       No current facility-administered medications for this visit.   Family History    Problem Relation Age of Onset  . Hypertension Mother   . Hyperlipidemia Mother   . Heart disease Mother   . Diabetes Mother   . Kidney disease Mother   . Stroke Father   . Diabetes Father   . Heart disease Father   . Hyperlipidemia Father   . Hypertension Father   . Kidney disease Father   . Cancer Sister   . Mental illness Sister   . Diabetes Sister   . Kidney disease Sister   . Hypertension Sister   . Mental illness Brother   . Asthma Son   . Hypertension Maternal Aunt   . Hypertension Maternal Uncle   . Hypertension Paternal Aunt   . Kidney disease Paternal Aunt   . Hypertension Paternal Uncle   . Kidney disease Paternal Uncle    History   Social History  . Marital Status: Divorced    Spouse Name: N/A    Number of Children: N/A  . Years of Education: N/A   Social History Main Topics  . Smoking status: Former Smoker -- 0.33 packs/day for 43 years    Types: Cigarettes    Quit date: 01/06/2013  . Smokeless tobacco: Never Used  . Alcohol Use: No     Comment: 10/27/2013 "quit drinking alcohol 04/27/1993"  . Drug Use: No     Comment: 10/27/2013 "quit using drugs 04/27/1993"  . Sexual Activity: None     Comment: S/P PARTIAL HYSTERECTOMY   Other Topics Concern  . None   Social History Narrative  . None   Review of Systems:  Constitutional:  Fatigue, decreased appetite, chills.   HEENT:  Denies congestion, sore throat  Respiratory:  Productive intermittent cough  Cardiovascular:  Denies chest pain, palpitations  Gastrointestinal:  Denies nausea, vomiting, diarrhea   Genitourinary:  Denies dysuria  Musculoskeletal:  Back pain and R knee pain  Skin:  R leg rash  Neurological:  Denies dizziness, syncope, and headaches.         Objective:  Physical Exam: Filed Vitals:   02/09/14 1415  BP: 94/68  Pulse: 88  Temp: 97.7 F (36.5 C)  Height: 5\' 4"  (1.626 m)  Weight: 159 lb 4.8 oz (72.258 kg)  SpO2: 97%   Vitals reviewed. General: sitting in chair,  NAD HEENT: EOMI Cardiac: RRR Pulm: clear to auscultation bilaterally, no wheezes, rales, or rhonchi Abd: soft, nontender, nondistended, BS present Ext: warm and well perfused, no pedal edema, +2DP B/L, R knee erythematous, palpable tender small gray blisters on anterior surface of R  knee and erythematous pruritic papules on posterior and lateral surface of R knee Neuro: alert and oriented X3, cranial nerves II-XII grossly intact, strength and sensation to light touch equal in bilateral upper and lower extremities  Assessment & Plan:  Discussed with Dr. Eppie Gibson ?Shingles--valacyclovir 1g tid x7 days, NSAIDS prn Insect bite--doxycycline 100mg  po bid x7 days Health maintenance--pap to be done on pap clinic day next month

## 2014-02-09 NOTE — Patient Instructions (Signed)
Dear Aimee Davis,  Please take doxycycline 176m twice a day by mouth for 7 days, make sure to stay up right for at least 30 minutes after taking the medication and please also start valtrex 10036mthree times a day for the next 7 days for possible shingles.   You can try ibuprofen or tylenol for pain relief, but if severe please let me know. If your rash is not improving or getting worse please let usKoreanow right away [id number] Shingles is caused by the same virus that causes chickenpox. The first feelings may be pain or tingling. A rash will follow in a couple days. The rash may occur on any area of the body. Long-lasting pain is more likely in an elderly person. It can last months to years. There are medicines that can help prevent pain if you start taking them early. HOME CARE   Place cool cloths on the rash.  Only take medicine as told by your doctor.  You may use calamine lotion to relieve itchy skin.  Avoid touching:  Babies.  Children with inflamed skin (eczema).  People who have gotten transplanted organs.  People with chronic illnesses, such as leukemia and AIDS.  If the rash is on the face, you may need to see a specialist. Keep all appointments. Shingles must be kept away from the eyes, if possible.  Keep all appointments.  Avoid touching the eyes or eye area, if possible. GET HELP RIGHT AWAY IF:   You have any pain on the face or eye.  Your medicines do not help.  Your redness or puffiness (swelling) spreads.  You have a fever.  You notice any red lines going away from the rash area. MAKE SURE YOU:   Understand these instructions.  Will watch your condition.  Will get help right away if you are not doing well or get worse. Document Released: 02/20/2008 Document Revised: 11/26/2011 Document Reviewed: 02/20/2008 [facility] Information 2014 [employer] Valacyclovir caplets What is this medicine? VALACYCLOVIR (val ay SYE kloe veer)  is an antiviral medicine. It is used to treat or prevent infections caused by certain kinds of viruses. Examples of these infections include herpes and shingles. This medicine will not cure herpes. This medicine may be used for other purposes; ask your health care provider or pharmacist if you have questions. COMMON BRAND NAME(S): Valtrex What should I tell my health care provider before I take this medicine? They need to know if you have any of these conditions: -acquired immunodeficiency syndrome (AIDS) -any other condition that may weaken the immune system -bone marrow or kidney transplant -kidney disease -an unusual or allergic reaction to valacyclovir, acyclovir, ganciclovir, valganciclovir, other medicines, foods, dyes, or preservatives -pregnant or trying to get pregnant -breast-feeding How should I use this medicine? Take this medicine by mouth with a glass of water. Follow the directions on the prescription label. You can take this medicine with or without food. Take your doses at regular intervals. Do not take your medicine more often than directed. Finish the full course prescribed by your doctor or health care professional even if you think your condition is better. Do not stop taking except on the advice of your doctor or health care professional. Talk to your pediatrician regarding the use of this medicine in children. While this drug may be prescribed for children as young as 2 years for selected conditions, precautions do apply. Overdosage: If you think you have taken too much of this medicine contact  a poison control center or emergency room at once. NOTE: This medicine is only for you. Do not share this medicine with others. What if I miss a dose? If you miss a dose, take it as soon as you can. If it is almost time for your next dose, take only that dose. Do not take double or extra doses. What may interact with this medicine? -cimetidine -probenecid This list may not describe  all possible interactions. Give your health care provider a list of all the medicines, herbs, non-prescription drugs, or dietary supplements you use. Also tell them if you smoke, drink alcohol, or use illegal drugs. Some items may interact with your medicine. What should I watch for while using this medicine? Tell your doctor or health care professional if your symptoms do not start to get better after 1 week. This medicine works best when taken early in the course of an infection, within the first 62 hours. Begin treatment as soon as possible after the first signs of infection like tingling, itching, or pain in the affected area. It is possible that genital herpes may still be spread even when you are not having symptoms. Always use safer sex practices like condoms made of latex or polyurethane whenever you have sexual contact. You should stay well hydrated while taking this medicine. Drink plenty of fluids. What side effects may I notice from receiving this medicine? Side effects that you should report to your doctor or health care professional as soon as possible: -allergic reactions like skin rash, itching or hives, swelling of the face, lips, or tongue -aggressive behavior -confusion -hallucinations -problems with balance, talking, walking -stomach pain -tremor -trouble passing urine or change in the amount of urine Side effects that usually do not require medical attention (report to your doctor or health care professional if they continue or are bothersome): -dizziness -headache -nausea, vomiting This list may not describe all possible side effects. Call your doctor for medical advice about side effects. You may report side effects to FDA at 1-800-FDA-1088. Where should I keep my medicine? Keep out of the reach of children. Store at room temperature between 15 and 25 degrees C (59 and 77 degrees F). Keep container tightly closed. Throw away any unused medicine after the expiration  date. NOTE: This sheet is a summary. It may not cover all possible information. If you have questions about this medicine, talk to your doctor, pharmacist, or health care provider.  2014, Elsevier/Gold Standard. (2012-08-19 16:34:05)  Doxycycline tablets or capsules What is this medicine? DOXYCYCLINE (dox i SYE kleen) is a tetracycline antibiotic. It kills certain bacteria or stops their growth. It is used to treat many kinds of infections, like dental, skin, respiratory, and urinary tract infections. It also treats acne, Lyme disease, malaria, and certain sexually transmitted infections. This medicine may be used for other purposes; ask your health care provider or pharmacist if you have questions. COMMON BRAND NAME(S): Adoxa CK, Adoxa Pak, Adoxa TT, Adoxa, Alodox, Avidoxy, Doxal, Monodox, Morgidox 1x Kit, Morgidox 1x, Morgidox 2x , Morgidox 2x Kit, Ocudox , Vibra-Tabs, Vibramycin What should I tell my health care provider before I take this medicine? They need to know if you have any of these conditions: -liver disease -long exposure to sunlight like working outdoors -stomach problems like colitis -an unusual or allergic reaction to doxycycline, tetracycline antibiotics, other medicines, foods, dyes, or preservatives -pregnant or trying to get pregnant -breast-feeding How should I use this medicine? Take this medicine by mouth with a  full glass of water. Follow the directions on the prescription label. It is best to take this medicine without food, but if it upsets your stomach take it with food. Take your medicine at regular intervals. Do not take your medicine more often than directed. Take all of your medicine as directed even if you think you are better. Do not skip doses or stop your medicine early. Talk to your pediatrician regarding the use of this medicine in children. Special care may be needed. While this drug may be prescribed for children as young as 84 years old for selected  conditions, precautions do apply. Overdosage: If you think you have taken too much of this medicine contact a poison control center or emergency room at once. NOTE: This medicine is only for you. Do not share this medicine with others. What if I miss a dose? If you miss a dose, take it as soon as you can. If it is almost time for your next dose, take only that dose. Do not take double or extra doses. What may interact with this medicine? -antacids -barbiturates -birth control pills -bismuth subsalicylate -carbamazepine -methoxyflurane -other antibiotics -phenytoin -vitamins that contain iron -warfarin This list may not describe all possible interactions. Give your health care provider a list of all the medicines, herbs, non-prescription drugs, or dietary supplements you use. Also tell them if you smoke, drink alcohol, or use illegal drugs. Some items may interact with your medicine. What should I watch for while using this medicine? Tell your doctor or health care professional if your symptoms do not improve. Do not treat diarrhea with over the counter products. Contact your doctor if you have diarrhea that lasts more than 2 days or if it is severe and watery. Do not take this medicine just before going to bed. It may not dissolve properly when you lay down and can cause pain in your throat. Drink plenty of fluids while taking this medicine to also help reduce irritation in your throat. This medicine can make you more sensitive to the sun. Keep out of the sun. If you cannot avoid being in the sun, wear protective clothing and use sunscreen. Do not use sun lamps or tanning beds/booths. Birth control pills may not work properly while you are taking this medicine. Talk to your doctor about using an extra method of birth control. If you are being treated for a sexually transmitted infection, avoid sexual contact until you have finished your treatment. Your sexual partner may also need  treatment. Avoid antacids, aluminum, calcium, magnesium, and iron products for 4 hours before and 2 hours after taking a dose of this medicine. If you are using this medicine to prevent malaria, you should still protect yourself from contact with mosquitos. Stay in screened-in areas, use mosquito nets, keep your body covered, and use an insect repellent. What side effects may I notice from receiving this medicine? Side effects that you should report to your doctor or health care professional as soon as possible: -allergic reactions like skin rash, itching or hives, swelling of the face, lips, or tongue -difficulty breathing -fever -itching in the rectal or genital area -pain on swallowing -redness, blistering, peeling or loosening of the skin, including inside the mouth -severe stomach pain or cramps -unusual bleeding or bruising -unusually weak or tired -yellowing of the eyes or skin Side effects that usually do not require medical attention (report to your doctor or health care professional if they continue or are bothersome): -diarrhea -loss of appetite -  nausea, vomiting This list may not describe all possible side effects. Call your doctor for medical advice about side effects. You may report side effects to FDA at 1-800-FDA-1088. Where should I keep my medicine? Keep out of the reach of children. Store at room temperature, below 30 degrees C (86 degrees F). Protect from light. Keep container tightly closed. Throw away any unused medicine after the expiration date. Taking this medicine after the expiration date can make you seriously ill. NOTE: This sheet is a summary. It may not cover all possible information. If you have questions about this medicine, talk to your doctor, pharmacist, or health care provider.  2014, Elsevier/Gold Standard. (2007-12-23 16:53:02)

## 2014-02-09 NOTE — Assessment & Plan Note (Signed)
Acute onset, erythematous, pruritic, small gray blisters, tender to palpation, sensitive skin, and across multiple dermatomes localized mainly to R knee anterior surface.    Highly suggestive of shingles in appearance and character of pain, however the multiple dermatome distribution makes diagnosis less likely. Clinical presentation further complicated by reports of ant exposure, thus the bites could be secondary to that. Therefore, difficult to have unifying diagnosis in this case, however, important to treat both at this time given significant discomfort.   -will start valacyclovir 1g po tid x7 days -NSAIDS prn for pain, if no improvement in pain, can try stronger pain medicaiton -instructions provided on home care for possible zoster -will also start doxycycline 100mg  po bid x7 days for possible ant bites and resulting infection -she is asked to follow up with our office in at least 1 week to report any improvement, if symptoms worsening or no improvement, will need to re-evaluate

## 2014-02-09 NOTE — Assessment & Plan Note (Addendum)
Continues to report worsening fatigue for the past 2 weeks. Possibly worsened in setting of R knee infection. Prior work up did not reveal anemia and TSH was noted to be wnl. She does have some weight loss from last office visit, however, stable from September 2014. BP was initially low again SBP 90's, but improved to 103/72 on re-check. Although she does have an intermittent cough with productive clear/pale sputum, PNA less likely at this time given clear lung examination on physical exam and chronicity of symptoms. If no improvement however, will warrant further investigation.   Malignancy must be considered in the differential and she is due for a repeat colonoscopy, but needs to reschedule her GI appointment at [facility]. Also, past smoker with recent cessation.   Stress may also be contributing given increased need for care of her mother.

## 2014-02-13 NOTE — Progress Notes (Signed)
I saw and evaluated the patient. I personally confirmed the key portions of Dr. Qureshi's history and exam and reviewed pertinent patient test results. The assessment, diagnosis, and plan were formulated together and I agree with the documentation in the resident's note. 

## 2014-02-16 ENCOUNTER — Telehealth: Payer: Self-pay | Admitting: *Deleted

## 2014-02-16 NOTE — Telephone Encounter (Signed)
Does she want something for nausea? i would prefer her to complete treatment if possible. Has the rash improved?  Thanks,  Dr q

## 2014-02-16 NOTE — Telephone Encounter (Signed)
I called pt to reminded her to schedule an appt @ [facility] per Dr Eula Fried. Pt stated she has 2 more days left on Valtrex - but is causing upset stomach. Thanks

## 2014-02-17 NOTE — Telephone Encounter (Signed)
States she's taking Nexium and Levsin; having trouble sleeping at night. States she should be all right ; she will finish the medication but will call if she has any problems. Also states the rash is "much better".

## 2014-03-15 ENCOUNTER — Ambulatory Visit: Payer: No Typology Code available for payment source

## 2014-03-23 ENCOUNTER — Ambulatory Visit: Payer: Self-pay

## 2014-04-01 ENCOUNTER — Other Ambulatory Visit: Payer: Self-pay | Admitting: *Deleted

## 2014-04-01 MED ORDER — MOMETASONE FUROATE 50 MCG/ACT NA SUSP: NASAL | Status: DC

## 2014-04-06 ENCOUNTER — Ambulatory Visit: Payer: Self-pay

## 2014-04-12 ENCOUNTER — Ambulatory Visit: Payer: No Typology Code available for payment source | Attending: Internal Medicine | Admitting: Audiology

## 2014-04-12 DIAGNOSIS — R94128 Abnormal results of other function studies of ear and other special senses: Secondary | ICD-10-CM

## 2014-04-12 DIAGNOSIS — M25569 Pain in unspecified knee: Secondary | ICD-10-CM | POA: Insufficient documentation

## 2014-04-12 DIAGNOSIS — M545 Low back pain, unspecified: Secondary | ICD-10-CM | POA: Insufficient documentation

## 2014-04-12 DIAGNOSIS — IMO0001 Reserved for inherently not codable concepts without codable children: Secondary | ICD-10-CM | POA: Insufficient documentation

## 2014-04-12 DIAGNOSIS — Z01118 Encounter for examination of ears and hearing with other abnormal findings: Secondary | ICD-10-CM

## 2014-04-12 DIAGNOSIS — H748X3 Other specified disorders of middle ear and mastoid, bilateral: Secondary | ICD-10-CM

## 2014-04-12 NOTE — Patient Instructions (Signed)
To closely monitor hearing a repeat audiological appt has been scheduled here for 2 months on Sept 29, 2015 at 11 am.  Neoma Laming L. Heide Spark, Au.D., CCC-A Doctor of Audiology 04/12/2014

## 2014-04-26 NOTE — Procedures (Signed)
  Outpatient Rehabilitation and [facility] [address] [place], [place] [address] [place] EVALUATION  Name: Aimee Davis DOB:  [birth year] MRN:  [id number]                                Diagnosis: Abnormal hearing test. Date: 04/12/2014    Referent: Jerene Pitch, MD  HISTORY: Aimee Davis, age 58 y.o. years, was seen for a repeat audiological evaluation.  She was previously seen here on 09/04/2013 and was found to have "low normal to a slight sensorineural loss bilaterally with slightly reduced speech recognition in quiet and significantly reduced speech recognition what a soft background noise is present".  Also noted were weak inner ear function by testing bilaterally.  Monitoring of her hearing was recommended.  Since the previous evaluation Aimee Davis reports "an increase in sound sensitivity (recruitment) at church and when listening to music". She also reports "allergies today" with some coughing.         EVALUATION: Pure tone air and tone conduction was completed using conventional audiometry with inserts.  Hearing thresholds are 30-40 dBHL at 250Hz ; 25-30 dBHL at 500Hz ; 20-25 dBHL at 1000Hz  and 10-20 dBHL from 2000Hz  - 4000Hz . Speech reception thresholds are 15 dBHL in the right ear and 15 dBHL in the left ear using recorded spondee words.  The reliability is good.  Word recognition is 96% at 55dBHL in the right and 94% at 55dBHL in the left using recorded NU-6 word lists in quiet. In minimal background noise with +5dB signal to noise ratio word recognition is 56 % in the right ear and 52% in the left ear. Otoscopic inspection reveals clear ear canals with visible tympanic membranes.  Tympanometry continues to show excessive compliance bilaterally (Type Ad).  Ipsilateral acoustic reflexes are absent bilaterally. Distortion Product Otoacoustic Emission (DPOAE) testing from 2000Hz  - 10,000Hz  are poorer than on the previous evaluation and range from  present to mostly weak and absent.   CONCLUSION:      Aimee Davis has mixed compared to the previous evaluation and close monitoring of her hearing with a repeat test in 2 months has been scheduled.  Aimee Davis has 1) slightly poorer low frequency hearing threshold bilaterally 2) improved word recognition in quiet bilaterally with background word recognition improved on the left side and slightly poorer on the right side.3) poorer ipsilateral acoustic reflexes bilaterally 4) poorer inner ear function bilaterally and a reported 5) increase in sound sensitivity (recruitment).    RECOMMENDATIONS: 1.   Monitor hearing closely with a repeat audiological evaluation closely monitor hearing a repeat audiological appt has been scheduled here for 2 months on Sept 29, 2015 at 11 am.  Neoma Laming L. Heide Spark, Au.D., CCC-A Doctor of Audiology 04/12/2014   cc: Jerene Pitch, MD

## 2014-05-12 ENCOUNTER — Other Ambulatory Visit: Payer: Self-pay | Admitting: *Deleted

## 2014-05-12 MED ORDER — ALBUTEROL SULFATE HFA 108 (90 BASE) MCG/ACT IN AERS: 2.0000 | INHALATION_SPRAY | Freq: Four times a day (QID) | RESPIRATORY_TRACT | Status: DC | PRN

## 2014-05-14 NOTE — Telephone Encounter (Signed)
Rx faxed in.

## 2014-06-15 ENCOUNTER — Encounter: Payer: Self-pay | Admitting: Internal Medicine

## 2014-06-15 ENCOUNTER — Ambulatory Visit (INDEPENDENT_AMBULATORY_CARE_PROVIDER_SITE_OTHER): Payer: No Typology Code available for payment source | Admitting: Internal Medicine

## 2014-06-15 ENCOUNTER — Ambulatory Visit: Payer: No Typology Code available for payment source | Attending: Internal Medicine | Admitting: Audiology

## 2014-06-15 VITALS — BP 133/51 | HR 64 | Temp 97.7°F | Ht 64.0 in | Wt 165.5 lb

## 2014-06-15 DIAGNOSIS — K589 Irritable bowel syndrome without diarrhea: Secondary | ICD-10-CM

## 2014-06-15 DIAGNOSIS — Z0000 Encounter for general adult medical examination without abnormal findings: Secondary | ICD-10-CM

## 2014-06-15 DIAGNOSIS — M545 Low back pain, unspecified: Secondary | ICD-10-CM | POA: Insufficient documentation

## 2014-06-15 DIAGNOSIS — H9190 Unspecified hearing loss, unspecified ear: Secondary | ICD-10-CM

## 2014-06-15 DIAGNOSIS — M25569 Pain in unspecified knee: Secondary | ICD-10-CM | POA: Insufficient documentation

## 2014-06-15 DIAGNOSIS — H9193 Unspecified hearing loss, bilateral: Secondary | ICD-10-CM

## 2014-06-15 DIAGNOSIS — H748X3 Other specified disorders of middle ear and mastoid, bilateral: Secondary | ICD-10-CM

## 2014-06-15 DIAGNOSIS — IMO0001 Reserved for inherently not codable concepts without codable children: Secondary | ICD-10-CM | POA: Insufficient documentation

## 2014-06-15 NOTE — Procedures (Signed)
Outpatient Rehabilitation and [facility]  [address]  [place], [place] [address]  [place] EVALUATION  Name: Aimee Davis  DOB: [birth year]  MRN: [id number]    Diagnosis: Abnormal hearing test.  Date: 06/15/2014    Referent: Jerene Pitch, MD    HISTORY:  [name], age 58 y.o. years, was seen for a repeat audiological evaluation. She was last seen here on 04/12/2014 with a mild low frequency hearing loss and a repeat test was scheduled for today to monitor 1) slightly poorer low frequency hearing threshold bilaterally 2) improved word recognition in quiet bilaterally with background word recognition improved on the left side and slightly poorer on the right side 3) poorer ipsilateral acoustic reflexes on the right sidey 4) poorer inner ear function bilaterally and 5) reported increase in sound sensitivity (recruitment).  Prior to this she was seen here on 09/04/2013 and was found to have "low normal to a slight sensorineural loss bilaterally with slightly reduced speech recognition in quiet and significantly reduced speech recognition what a soft background noise is present." Also noted were weak inner ear function by testing bilaterally.   [name] continues to report " sound sensitivity (recruitment) at church and when listening to music". She also reports "allergies today" with some coughing. She reports having an appointment later today with her primary physician for "irritable bowel" that has become worse over the past year with now having difficulty "every morning". She also reports unsteadiness "first thing in the morning".     EVALUATION:  Pure tone air and tone conduction was completed using conventional audiometry with inserts. Hearing thresholds are 45 dBHL at 250Hz ; 30 dBHL at 500Hz ; 25 dBHL at 1000Hz  and 20-25 dBHL from 2000Hz ; 15 dBHL at 4000Hz ; 20 dBHL at 8000Hz  in each ear.  Bone conduction is primarily sensorineural with a slight  conductive component at 250Hz . Speech reception thresholds are 20 dBHL in the right ear and 20 dBHL in the left ear using recorded spondee words. The reliability is good. Word recognition is 96% at 55dBHL in the right and 96% at 55dBHL in the left using recorded NU-6 word lists in quiet. In minimal background noise with +5dB signal to noise ratio word recognition is 64 % in the right ear and 50% in the left ear. Otoscopic inspection reveals clear ear canals with visible tympanic membranes. Tympanometry continues to show excessive compliance bilaterally (Type Ad). Ipsilateral acoustic reflexes are present to elevated on the right and elevated to no response on the left. Distortion Product Otoacoustic Emission (DPOAE) testing from 2000Hz  - 10,000Hz  are poorer than December 2014 but are similar to the July 2015 evaluation and range from present to mostly weak and absent.   CONCLUSION:  Madason C Erdman's hearing thresholds are slightly poorer at 250Hz  than in July but the high frequencies are stable.  Her word recognition in quiet is excellent and continues to be poor in minimal background noise, but this is stable since July 2015 and has improved since December 2014.  Although her uncomfortable loudness levels seem stable, Ms. Bhargava reports being more sensitive to "loud music, loud talking near her ear and loud sounds" while at the same time she reports having "more difficulty with soft voices" and that she is "watching their mouth" more.  Ms. Peraza also reports being unsteady when she first gets up in the morning.  Please consider further evaluation by an ENT and/or closely monitor her hearing with a repeat test in 3 months -  here or at the ENT.   RECOMMENDATIONS:  1. Consider further evaluation by an ENT because of the mild low frequency hearing loss at 250Hz  - 500Hz , reports of being more sound sensitivity and not being able to hear soft talking and reports of unsteadiness in the morning.  2.  Monitor  hearing closely with a repeat audiological evaluation in 3 months, here or at the ENT.    Deborah L. Heide Spark, Au.D., CCC-A  Doctor of Audiology  06/15/2014  cc: Jerene Pitch, MD

## 2014-06-16 NOTE — Progress Notes (Signed)
Subjective:   Patient ID: Aimee Davis female   DOB: March 03, 1956 58 y.o.   MRN: [id number]  HPI: [name] is a 58 y.o. female with presumed IBS who presents to opc today for routine follow up visit.   IBS--follows at [place], continues to have crampy abdominal pain almost every morning, improved with levsin. She thought she had an EGD in the past but no record found at this time on epic. In the past gluten free diet has helped with symptoms but she cannot afford that diet.   She refused flu vaccine and pap-smear today but plans to return in October.    Unfortunately her sister has just been diagnosed with breast cancer, which makes the third relative with similar diagnosis in her family.   Past Medical History  Diagnosis Date  . Allergy     seasonal, PCN, antidepressants, bentyl  . Asthma   . Carpal tunnel syndrome on both sides     right worse than left 2008  . Hyperlipidemia     LDL 108 JULY 2013  . Positive TB test     From [facility] 2009 note : Recent F/u at [facility]. CXRAy negative.  Positive TST in 2007 (43mm).  Unable to tolerate INH due to hepatotoxicity  . COPD (chronic obstructive pulmonary disease)   . Anemia   . GERD (gastroesophageal reflux disease)   . Chronic lower back pain    Current Outpatient Prescriptions  Medication Sig Dispense Refill  . albuterol (PROVENTIL HFA;VENTOLIN HFA) 108 (90 BASE) MCG/ACT inhaler Inhale 2 puffs into the lungs every 6 (six) hours as needed for wheezing.  1 Inhaler  6  . Artificial Tear Ointment (DRY EYES OP) Apply 1 drop to eye daily as needed (dry eyes).      Marland Kitchen esomeprazole (NEXIUM) 40 MG capsule Take 1 capsule (40 mg total) by mouth daily before breakfast.  90 capsule  3  . fish oil-omega-3 fatty acids 1000 MG capsule Take 2 g by mouth daily.       . Flaxseed MISC Take by mouth See admin instructions. Teaspoonful of seed blended and mixed with yogurt      . Fluticasone-Salmeterol (ADVAIR DISKUS) 250-50 MCG/DOSE AEPB  Inhale 1 puff into the lungs 2 (two) times daily.  60 each  4  . hyoscyamine (LEVSIN SL) 0.125 MG SL tablet Place 1 tablet (0.125 mg total) under the tongue every 4 (four) hours as needed for cramping.  30 tablet  1  . ibuprofen (ADVIL,MOTRIN) 400 MG tablet Take 1 tablet (400 mg total) by mouth every 6 (six) hours as needed for pain.  60 tablet  0  . loratadine (CLARITIN) 10 MG tablet Take 10 mg by mouth daily as needed for allergies.       . mometasone (NASONEX) 50 MCG/ACT nasal spray Place 2 sprays in each nostril once daily as needed  17 g  5  . Multiple Vitamin (MULTIVITAMIN) capsule Take 1 capsule by mouth daily.      . Zolpidem Tartrate 10 MG SUBL Place 5 mg under the tongue.       No current facility-administered medications for this visit.   Family History  Problem Relation Age of Onset  . Hypertension Mother   . Hyperlipidemia Mother   . Heart disease Mother   . Diabetes Mother   . Kidney disease Mother   . Stroke Father   . Diabetes Father   . Heart disease Father   . Hyperlipidemia Father   .  Hypertension Father   . Kidney disease Father   . Cancer Sister   . Mental illness Sister   . Diabetes Sister   . Kidney disease Sister   . Hypertension Sister   . Mental illness Brother   . Asthma Son   . Hypertension Maternal Aunt   . Hypertension Maternal Uncle   . Hypertension Paternal Aunt   . Kidney disease Paternal Aunt   . Hypertension Paternal Uncle   . Kidney disease Paternal Uncle    History   Social History  . Marital Status: Divorced    Spouse Name: N/A    Number of Children: N/A  . Years of Education: N/A   Social History Main Topics  . Smoking status: Former Smoker -- 0.33 packs/day for 43 years    Types: Cigarettes    Quit date: 01/06/2013  . Smokeless tobacco: Never Used  . Alcohol Use: No     Comment: 10/27/2013 "quit drinking alcohol 04/27/1993"  . Drug Use: No     Comment: 10/27/2013 "quit using drugs 04/27/1993"  . Sexual Activity: None      Comment: S/P PARTIAL HYSTERECTOMY   Other Topics Concern  . None   Social History Narrative  . None   Review of Systems:  Constitutional:  Denies fever, chills  HEENT:  Denies congestion  Respiratory:  Denies SOB  Cardiovascular:  Denies chest pain  Gastrointestinal:  Abdominal pain  Genitourinary:  Denies dysuria  Musculoskeletal:  Denies myalgias  Skin:  Denies pallor, rash and wound.   Neurological:  Denies seizure or syncope   Objective:  Physical Exam: Filed Vitals:   06/15/14 1615  BP: 133/51  Pulse: 64  Temp: 97.7 F (36.5 C)  TempSrc: Oral  Height: 5\' 4"  (1.626 m)  Weight: 165 lb 8 oz (75.07 kg)  SpO2: 100%   Vitals reviewed. General: sitting in chair, NAD HEENT: EOMI Cardiac: RRR Pulm: clear to auscultation bilaterally, no wheezes, rales, or rhonchi Abd: soft, nontender but sore, BS present Ext: warm and well perfused, no pedal edema, +moving all four extremities Neuro: alert and oriented X3  Assessment & Plan:  Discussed with Dr. Beryle Beams RTC in october

## 2014-06-16 NOTE — Assessment & Plan Note (Signed)
Refused flu vaccine and pap smear today but will return in October for both.

## 2014-06-16 NOTE — Assessment & Plan Note (Signed)
audiological evaluation 9/29: Consider further evaluation by an ENT because of the mild low frequency hearing loss at 250Hz  - 500Hz , reports of being more sound sensitivity and not being able to hear soft talking and reports of unsteadiness in the morning. -ENT referral placed

## 2014-06-16 NOTE — Progress Notes (Signed)
Medicine attending: Medical history, presenting problems, physical findings, and medications, reviewed with resident physician Dr. Jerene Pitch and I concur with her evaluation and management plan. Murriel Hopper, M.D., [place]

## 2014-06-16 NOTE — Assessment & Plan Note (Signed)
Follows [facility]. No record of egd as requested per gi doctor.   -encouraged her to follow up with his office to inform of no recent EGD and they can decide if wish to proceed with EGD -continue levsin

## 2014-07-23 ENCOUNTER — Other Ambulatory Visit: Payer: Self-pay | Admitting: *Deleted

## 2014-07-23 DIAGNOSIS — K589 Irritable bowel syndrome without diarrhea: Secondary | ICD-10-CM

## 2014-07-23 MED ORDER — HYOSCYAMINE SULFATE 0.125 MG SL SUBL: 0.1250 mg | SUBLINGUAL_TABLET | SUBLINGUAL | Status: DC | PRN

## 2014-07-23 NOTE — Telephone Encounter (Signed)
Rx called in to pharmacy. 

## 2014-08-04 ENCOUNTER — Ambulatory Visit: Payer: No Typology Code available for payment source

## 2014-08-10 ENCOUNTER — Ambulatory Visit (INDEPENDENT_AMBULATORY_CARE_PROVIDER_SITE_OTHER): Payer: No Typology Code available for payment source | Admitting: Internal Medicine

## 2014-08-10 ENCOUNTER — Ambulatory Visit (INDEPENDENT_AMBULATORY_CARE_PROVIDER_SITE_OTHER): Payer: No Typology Code available for payment source | Admitting: *Deleted

## 2014-08-10 ENCOUNTER — Encounter: Payer: Self-pay | Admitting: Internal Medicine

## 2014-08-10 VITALS — BP 132/74 | HR 76 | Temp 97.5°F | Ht 64.0 in | Wt 166.6 lb

## 2014-08-10 DIAGNOSIS — Z0000 Encounter for general adult medical examination without abnormal findings: Secondary | ICD-10-CM

## 2014-08-10 DIAGNOSIS — F17200 Nicotine dependence, unspecified, uncomplicated: Secondary | ICD-10-CM

## 2014-08-10 DIAGNOSIS — Z720 Tobacco use: Secondary | ICD-10-CM

## 2014-08-10 DIAGNOSIS — Z23 Encounter for immunization: Secondary | ICD-10-CM

## 2014-08-10 DIAGNOSIS — K589 Irritable bowel syndrome without diarrhea: Secondary | ICD-10-CM

## 2014-08-10 NOTE — Patient Instructions (Addendum)
General Instructions:  Please bring your medicines with you each time you come to clinic.  Medicines may include prescription medications, over-the-counter medications, herbal remedies, eye drops, vitamins, or other pills.  Please follow up with GI on 12/9, if your cramping is getting worse please let them and [place] know or go to the emergency room if severe   Progress Toward Treatment Goals:  Treatment Goal 12/04/2012  Stop smoking smoking the same amount    Self Care Goals & Plans:  Self Care Goal 12/11/2012  Manage my medications take my medicines as prescribed; bring my medications to every visit; refill my medications on time  Be physically active take a walk every day  Stop smoking (No Data)    No flowsheet data found.   Care Management & Community Referrals:  Referral 03/31/2013  Referrals made for care management support -  Referrals made to community resources exercise/physical therapy

## 2014-08-10 NOTE — Progress Notes (Signed)
Subjective:   Patient ID: NAYELLI INGLIS female   DOB: Jun 18, 1956 58 y.o.   MRN: [id number]  HPI: Ms.Mykaylah C Palmatier is a 58 y.o. female with PMH as listed below presenting to opc today for routine visit.   Health maintenance--flu shot and papsmear today (hx of partial hysterectomy but no records on epic and she does not recall if cervix out)  IBS--follows at [facility], last seen 05/2014. They requested EGD records but it does not appear that she has one one before. Metamucil was started to bulk stools and she was continued on hyoscyamine. Possible pain clinic referral for injection therapy for chronic abdominal pain? Follow up recommended for December and she has an appt 12/9.  She continues to have severe spasm lower abdominal pain almost every morning, below umbilicus that resolves ~1 hour after taking hyoscyamine and nexium. She is bothered by this pain. Wonders if may be more now that she has stopped smoking and using nicotine gum.   Past Medical History  Diagnosis Date  . Allergy     seasonal, PCN, antidepressants, bentyl  . Asthma   . Carpal tunnel syndrome on both sides     right worse than left 2008  . Hyperlipidemia     LDL 108 JULY 2013  . Positive TB test     From M[facility] 2009 note : Recent F/u at [facility]. CXRAy negative.  Positive TST in 2007 (26mm).  Unable to tolerate INH due to hepatotoxicity  . COPD (chronic obstructive pulmonary disease)   . Anemia   . GERD (gastroesophageal reflux disease)   . Chronic lower back pain    Current Outpatient Prescriptions  Medication Sig Dispense Refill  . albuterol (PROVENTIL HFA;VENTOLIN HFA) 108 (90 BASE) MCG/ACT inhaler Inhale 2 puffs into the lungs every 6 (six) hours as needed for wheezing. 1 Inhaler 6  . Artificial Tear Ointment (DRY EYES OP) Apply 1 drop to eye daily as needed (dry eyes).    Marland Kitchen esomeprazole (NEXIUM) 40 MG capsule Take 1 capsule (40 mg total) by mouth daily before breakfast. 90 capsule 3  . fish oil-omega-3  fatty acids 1000 MG capsule Take 2 g by mouth daily.     . Flaxseed MISC Take by mouth See admin instructions. Teaspoonful of seed blended and mixed with yogurt    . Fluticasone-Salmeterol (ADVAIR DISKUS) 250-50 MCG/DOSE AEPB Inhale 1 puff into the lungs 2 (two) times daily. 60 each 4  . hyoscyamine (LEVSIN SL) 0.125 MG SL tablet Place 1 tablet (0.125 mg total) under the tongue every 4 (four) hours as needed for cramping. 30 tablet 1  . ibuprofen (ADVIL,MOTRIN) 400 MG tablet Take 1 tablet (400 mg total) by mouth every 6 (six) hours as needed for pain. 60 tablet 0  . loratadine (CLARITIN) 10 MG tablet Take 10 mg by mouth daily as needed for allergies.     . mometasone (NASONEX) 50 MCG/ACT nasal spray Place 2 sprays in each nostril once daily as needed 17 g 5  . Multiple Vitamin (MULTIVITAMIN) capsule Take 1 capsule by mouth daily.    . Zolpidem Tartrate 10 MG SUBL Place 5 mg under the tongue.     No current facility-administered medications for this visit.   Family History  Problem Relation Age of Onset  . Hypertension Mother   . Hyperlipidemia Mother   . Heart disease Mother   . Diabetes Mother   . Kidney disease Mother   . Stroke Father   . Diabetes Father   .  Heart disease Father   . Hyperlipidemia Father   . Hypertension Father   . Kidney disease Father   . Cancer Sister   . Mental illness Sister   . Diabetes Sister   . Kidney disease Sister   . Hypertension Sister   . Mental illness Brother   . Asthma Son   . Hypertension Maternal Aunt   . Hypertension Maternal Uncle   . Hypertension Paternal Aunt   . Kidney disease Paternal Aunt   . Hypertension Paternal Uncle   . Kidney disease Paternal Uncle    History   Social History  . Marital Status: Divorced    Spouse Name: N/A    Number of Children: N/A  . Years of Education: N/A   Social History Main Topics  . Smoking status: Former Smoker -- 0.33 packs/day for 43 years    Types: Cigarettes    Quit date: 01/06/2013  .  Smokeless tobacco: Never Used  . Alcohol Use: No     Comment: 10/27/2013 "quit drinking alcohol 04/27/1993"  . Drug Use: No     Comment: 10/27/2013 "quit using drugs 04/27/1993"  . Sexual Activity: Not on file     Comment: S/P PARTIAL HYSTERECTOMY   Other Topics Concern  . Not on file   Social History Narrative  . No narrative on file    Review of Systems:  Constitutional:  Denies fever, chills  HEENT:  Denies congestion  Respiratory:  Denies SOB  Cardiovascular:  Denies chest pain  Gastrointestinal:  Denies nausea, vomiting. Crampy abdominal pain in the mornings and constipation  Genitourinary:  Denies dysuria  Musculoskeletal:  Denies gait problem.   Skin:  Denies pallor, rash and wound.   Neurological:  Denies headaches.    Objective:  Physical Exam: Filed Vitals:   08/10/14 1425  BP: 140/67  Pulse: 85  Temp: 97.5 F (36.4 C)  TempSrc: Oral  Height: 5\' 4"  (1.626 m)  Weight: 166 lb 9.6 oz (75.569 kg)  SpO2: 99%   Vitals reviewed. General: sitting in chair, NAD HEENT: EOMI Cardiac: RRR Pulm: clear to auscultation bilaterally, no wheezes, rales, or rhonchi Abd: soft, nontender, nondistended, BS present but endorses vague soreness on palpation especially below umbilicus GYN: difficult exam due to patient discomfort and pain. Cervix appears to be removed but questionable fleshy protruded area ?cusp also palpated on manual exam left side. Bleeding noted on scraping. Ext: moving all extremities Neuro: alert and oriented X3, strength equal in bilateral upper and lower extremities  Assessment & Plan:  Discussed with Dr. Lynnae January

## 2014-08-10 NOTE — Assessment & Plan Note (Signed)
Continues to have lower abdominal severe spasm pain early in the morning that resolves with hyoscyamine and nexium. On and off constipation, improved with mild of mag and occasionally uses metamucil.   Has [facility] follow up 12/9, recommended for possible abdominal injections for pain control. Prior abdominal imaging has been non-contributory. No prior EGD. Last hpylori testing 2009 neg.  Continue hyoscyamine Metamucil and nexium Given information of FODMAP diet today and gave nutritionist card for her to call for assistance with diet options that she can afford

## 2014-08-10 NOTE — Assessment & Plan Note (Signed)
  Assessment: Progress toward smoking cessation:   still not smoking Barriers to progress toward smoking cessation:    Comments: using nicotine gum daily and feels like may be addicted to it so trying to wean down  Plan: Instruction/counseling given:  I commended patient for quitting and reviewed strategies for preventing relapses. Educational resources provided:    Self management tools provided:    Medications to assist with smoking cessation:  Nicotine Gum Patient agreed to the following self-care plans for smoking cessation:    Other plans: recommended to call quitline and slowly transition off nicotine gum to regular gum

## 2014-08-10 NOTE — Assessment & Plan Note (Signed)
Flu vaccine today papsmear attempted, does not appear to have cervix but swab sent

## 2014-08-11 NOTE — Progress Notes (Signed)
Internal Medicine Clinic Attending  Case discussed with Dr. Qureshi at the time of the visit.  We reviewed the resident's history and exam and pertinent patient test results.  I agree with the assessment, diagnosis, and plan of care documented in the resident's note. 

## 2014-08-18 LAB — CYTOLOGY - PAP

## 2014-08-24 ENCOUNTER — Other Ambulatory Visit: Payer: Self-pay | Admitting: *Deleted

## 2014-08-24 MED ORDER — ESOMEPRAZOLE MAGNESIUM 40 MG PO CPDR: 40.0000 mg | DELAYED_RELEASE_CAPSULE | Freq: Every day | ORAL | Status: DC

## 2014-08-31 ENCOUNTER — Ambulatory Visit: Payer: No Typology Code available for payment source | Admitting: Audiology

## 2014-10-01 ENCOUNTER — Ambulatory Visit: Payer: No Typology Code available for payment source | Attending: Audiology | Admitting: Audiology

## 2014-10-01 DIAGNOSIS — H905 Unspecified sensorineural hearing loss: Secondary | ICD-10-CM | POA: Insufficient documentation

## 2014-10-01 NOTE — Patient Instructions (Addendum)
CONCLUSION: Sight to mild sensory neural loss bilaterally which is about 10dB worse than her last evaluation in 2015.  RECOMMENDATIONS:  1. Hearing should be monitored at least on an annual basis and sooner should there be an increase in symptoms.  2. As the patient has noticed an increase in her hearing difficulty, and her speech pure tone average is about 10dB worse as compared to her previous evaluation in 2015; a hearing aid(s) evaluation is recommended. The patient was given a list of local providers.  3. In addition, there are also ways to improve one's listening environment. Such as: (A) When in a restaurant, sit with your back against a perimeter wall, thus reducing the extraneous noise. (B) When in meetings, do not sit near an open doorway and position yourself within 8-10ft of the speaker. Front and center is typically best. (C) Eliminate background noises at home such as the television, dishwasher, etc when having important conversations. (D) Ask family members to face you when they speak and not to drop their heads or turn and walkway while still speaking.  4. Because Saraiyah has a weakened auditory system she is more at risk from damage of noise exposure than someone with a healthy auditory system and therefore should avoid excessive nose exposure if possible or certainly utilize ear protection.

## 2014-10-01 NOTE — Procedures (Signed)
[place], [place]  [address] [address] EVALUATION  Patient Name: [name] Record Number:  [id number] Date of Birth:  14-Mar-1956     Date of Test:  10/01/2014  HISTORY:  Aimee Davis, a 59 y.o. female old was seen for audiological evaluation upon referral of Aimee Pitch, MD.  She has been monitored in this office for the past two years with the complaint of difficulty hearing in noise and in quiet, the need for lip reading and difficulty with hearing over the phone and while watching television.  History includes trauma to the left ear in the 1970's, a history of occupational noise exposure ([employer]) without ear protection and a familial history of hearing loss (mother wears aids since 72yrs of age). She denies middle ear disease, significant tinnitus, dizziness, headaches and facial numbness. Previous results indicated good speech understanding in quiet,  poor speech in noise scores and a slight sensory neural loss.  Today she reports no change in her medical history with the exception of even more difficulty with her hearing.  REPORT OF PAIN:  None  EVALUATION:   Air and bone conduction audiometry from 500Hz  - 8000Hz  utilizing insert  earphones revealed a bilateral slight to mild hearing loss.   Speech reception thresholds were consistent with the pure tone results indicative of good test reliability.  Speech recognition testing was conducted in each ear independently, at a comfortable listening level (65-70dBHL) and indicated 96% and 100% in the right and left ears respectively.  Speech recognition abilities while in the presence of a soft background noise (s/n+5) were also evaluated.  Under this condition, scores were 64% in the right and 48% in the left ear.   Impedance audiometry was utilized and a Type Ad tympanogram was obtained on the right side and the same  was obtained on the  left side suggesting normal middle ear volume and pressure, however, with excessive middle ear compliance.  Acoustic reflexes were tested from 500Hz  - 4000Hz  and were absent on the right side and absent on the left side.  Distortion Product Otoacoustic Emissions were tested from 2000Hz  - 10000Hz  and were weak or absent on the right side and weak or absent on the left side, indicative of poor outer hair cell function within in the inner ear.  CONCLUSION:   Sight to mild sensory neural loss bilaterally which is about 10dB worse than her last evaluation in 2015.  RECOMMENDATIONS:    1. Hearing should be monitored at least on an annual basis and sooner should there be an increase in symptoms.  2. As the patient has noticed an increase in her hearing difficulty, and her speech pure tone average is about 10dB worse as compared to her previous evaluation in 2015; a hearing aid(s) evaluation is recommended.  The patient was given a list of local providers.   3. In addition, there are also ways to improve one's listening environment. Such as:  (A) When in a restaurant, sit with your back against a perimeter wall, thus reducing the extraneous noise.   (B) When in meetings, do not sit near an open doorway and position yourself within 8-10ft of the speaker. Front and center is typically best.  (C) Eliminate background noises at home such as the television, dishwasher, etc when having important conversations.  (D) Ask family members to face you when they speak and not to drop their heads or turn and walkway  while still speaking.   4. Because Aimee Davis has a weakened auditory system she is more at risk from damage of noise exposure than someone with a healthy auditory system and therefore should avoid excessive nose exposure if possible or certainly utilize ear protection.     Aimee Davis, Au.D. Doctor of Audiology CCC-A  cc:

## 2014-10-05 ENCOUNTER — Telehealth: Payer: Self-pay | Admitting: *Deleted

## 2014-10-05 ENCOUNTER — Ambulatory Visit: Payer: No Typology Code available for payment source

## 2014-10-05 NOTE — Telephone Encounter (Signed)
Agreed.  Thank you

## 2014-10-05 NOTE — Telephone Encounter (Signed)
She can let time run its course or take OTC meds like decongestants, mucinex, saline nose spray, etc. If unable to eat or drink, high fever, no better after 7-10 days, or getting worse, will need to be seen.

## 2014-10-05 NOTE — Telephone Encounter (Signed)
Pt called with c/o awful cold,sneezing, cough, runny nose.  Chest congestion.  No energy, no appetite. Not feeling good.  Onset 4 days ago. She has taken Tussinex DM,and  IBU with some relief.  Tussinex bothered her IBS so not sure what to take. She has had abd spasms from it. Denies fever or pain. Good fluid intake.  Please advise what else she can try.  Pt # [id number]

## 2014-10-05 NOTE — Telephone Encounter (Signed)
Pt informed and will call back if needed.

## 2014-11-18 ENCOUNTER — Ambulatory Visit (INDEPENDENT_AMBULATORY_CARE_PROVIDER_SITE_OTHER): Payer: Self-pay | Admitting: Internal Medicine

## 2014-11-18 ENCOUNTER — Encounter: Payer: Self-pay | Admitting: Internal Medicine

## 2014-11-18 ENCOUNTER — Telehealth: Payer: Self-pay | Admitting: *Deleted

## 2014-11-18 VITALS — BP 140/69 | HR 52 | Temp 97.9°F | Ht 64.0 in | Wt 161.9 lb

## 2014-11-18 DIAGNOSIS — I951 Orthostatic hypotension: Secondary | ICD-10-CM | POA: Insufficient documentation

## 2014-11-18 LAB — BASIC METABOLIC PANEL WITH GFR
BUN: 14 mg/dL (ref 6–23)
CALCIUM: 9.9 mg/dL (ref 8.4–10.5)
CO2: 28 mEq/L (ref 19–32)
Chloride: 105 mEq/L (ref 96–112)
Creat: 0.91 mg/dL (ref 0.50–1.10)
GFR, EST AFRICAN AMERICAN: 79 mL/min
GFR, Est Non African American: 69 mL/min
Glucose, Bld: 94 mg/dL (ref 70–99)
POTASSIUM: 4.7 meq/L (ref 3.5–5.3)
Sodium: 140 mEq/L (ref 135–145)

## 2014-11-18 LAB — CBC
HCT: 38.1 % (ref 36.0–46.0)
Hemoglobin: 12.3 g/dL (ref 12.0–15.0)
MCH: 26.9 pg (ref 26.0–34.0)
MCHC: 32.3 g/dL (ref 30.0–36.0)
MCV: 83.2 fL (ref 78.0–100.0)
MPV: 10.1 fL (ref 8.6–12.4)
Platelets: 258 10*3/uL (ref 150–400)
RBC: 4.58 MIL/uL (ref 3.87–5.11)
RDW: 13.6 % (ref 11.5–15.5)
WBC: 3.1 10*3/uL — AB (ref 4.0–10.5)

## 2014-11-18 NOTE — Patient Instructions (Addendum)
It was a pleasure to see you today. I will call you if there is anything abnormal in your blood work. Please buy compression stockings and wear. Please try to eat salt. Call us if no improvement in two weeks. Please return to clinic or seek medical attention if you have any new or worsening dizziness, weakness, trouble walking, or other worrisome medical condition. We look forward to seeing you again if needed in two weeks.  Lottie Mussel, MD  General Instructions:   Please try to bring all your medicines next time. This will help [place] keep you safe from mistakes.   Progress Toward Treatment Goals:  Treatment Goal 12/04/2012  Stop smoking smoking the same amount    Self Care Goals & Plans:  Self Care Goal 12/11/2012  Manage my medications take my medicines as prescribed; bring my medications to every visit; refill my medications on time  Be physically active take a walk every day  Stop smoking (No Data)    No flowsheet data found.   Care Management & Community Referrals:  Referral 03/31/2013  Referrals made for care management support -  Referrals made to community resources exercise/physical therapy

## 2014-11-18 NOTE — Telephone Encounter (Signed)
Thank you. Yes she should definitely call 911 and be seen immediately.   Thank you for talking with her, hopefully she can be evaluated soon.

## 2014-11-18 NOTE — Telephone Encounter (Signed)
Pt calls and states she awoke this morning to dizziness, weakness, unsteady gait, she lives alone, denies h/a, N&V, speech changes, changes in strength. She is advised to go to ED for eval but refuses to call 911 and has no transportation, she is afraid to drive and i agree that she does not need to be driving during this time, she is going to try to find transportation and call back to confirm 1330 appt dr Ethelene Hal

## 2014-11-18 NOTE — Progress Notes (Signed)
Medicine attending: Medical history, presenting problems, physical findings, and medications, reviewed with Dr Lottie Mussel and I concur with his evaluation and management plan.

## 2014-11-18 NOTE — Progress Notes (Signed)
   Subjective:    Patient ID: Aimee Davis, female    DOB: 07-Feb-1956, 59 y.o.   MRN: [id number]  HPI  Aimee Davis is a 59 year old woman with COPD/asthma, IBS, GERD who presents for weakness. She called [facility] to be seen today as she woke up this morning at 7 am with dizziness, weakness, and unsteady gait. She said the room appeared to be spinning. She says it has not stopped but it has improved some. If she gets up from sitting then it comes on worse. She said the spinning sensation made her nauseous but she has not had emesis. This feels the same as when she had carbon monoxide poisoning last year 10/2008. She had the carbon monoxide monitor replaced in her house after that incident. She did not have any appliances running overnight. She denies any numbness, paresthesia, vision changes, fevers, chills, night sweats, difficulty speaking or swallowing, focal weakness.   Review of Systems  Constitutional: Negative for fever, chills and diaphoresis.  Respiratory: Negative for cough and shortness of breath.   Cardiovascular: Negative for chest pain.  Gastrointestinal: Positive for nausea. Negative for vomiting, abdominal pain, diarrhea and constipation.  Musculoskeletal: Positive for gait problem.  Neurological: Positive for dizziness. Negative for facial asymmetry, speech difficulty, weakness, light-headedness, numbness and headaches.       Objective:   Physical Exam  Constitutional: She is oriented to person, place, and time. She appears well-developed and well-nourished. No distress.  HENT:  Head: Normocephalic and atraumatic.  Mouth/Throat: Oropharynx is clear and moist.  Eyes: EOM are normal. Pupils are equal, round, and reactive to light.  Neck: Normal range of motion.  No nuchal rigidity  Cardiovascular: Normal rate, regular rhythm, normal heart sounds and intact distal pulses.  Exam reveals no gallop and no friction rub.   No murmur heard. Pulmonary/Chest: Effort normal and breath  sounds normal. No respiratory distress. She has no wheezes.  Abdominal: Soft. Bowel sounds are normal. She exhibits no distension. There is no tenderness.  Neurological: She is alert and oriented to person, place, and time. No cranial nerve deficit. Coordination normal.  Strength 5/5 and symmetric in all extremities, sensation intact, gait normal, Dix-Hallpike worsens vertigo but no nystagmus  Skin: She is not diaphoretic.  Psychiatric: She has a normal mood and affect.  Vitals reviewed.         Assessment & Plan:

## 2014-11-18 NOTE — Assessment & Plan Note (Addendum)
Ms Cosper's symptoms are likely due to orthostatic hypotension. Her BP and pulse went from 134/68 and 45 lying, to 116/66 and 52 sitting, to 119/81 and 53 standing. If this is true vertigo, and that is not clear because she said when she woke up she felt that things were moving but now there is no spinning sensation, it would most likely be peripheral and not central as there is no nystagmus, she is able to walk, and she has no focal neurologic deficits on exam. Dix-Hallpike did not elicit nystagmus which makes BPPV less likely especially with noted hypotension on exam. She did not appear dry on exam but will check BUN,creatinine. Last hemoglobin 13.0 in 10/2013. She is not on any medications that would explain orthostatics. -check CBC, BMP -nonpharmacologic treatment such as compression stocking, sodium diet, changing positions slowloy -advised to call to return to clinic in 2 weeks if no improvement and will consider imaging or ENT referral   ADDENDUM: BMP returned normal so not dry. CBC notable for leukopenia of 3.1 which is at her baseline. This supports autonomic dysregulation for cause of orthostatic hypotension.  Lottie Mussel, MD 11/19/14 9:30 am

## 2014-11-23 ENCOUNTER — Encounter: Payer: Self-pay | Admitting: Internal Medicine

## 2014-12-02 ENCOUNTER — Other Ambulatory Visit: Payer: Self-pay | Admitting: *Deleted

## 2014-12-02 NOTE — Telephone Encounter (Signed)
Hi helen,  Does she still need this. It has not been filled in a while.

## 2014-12-08 MED ORDER — MOMETASONE FUROATE 50 MCG/ACT NA SUSP
NASAL | Status: DC
Start: 2014-12-08 — End: 2016-11-13

## 2014-12-08 NOTE — Telephone Encounter (Signed)
Yes she does, she gets 3 months at a time

## 2014-12-08 NOTE — Telephone Encounter (Signed)
Left vmail of refill at [facility]

## 2014-12-13 ENCOUNTER — Other Ambulatory Visit: Payer: Self-pay | Admitting: Internal Medicine

## 2014-12-13 DIAGNOSIS — Z1231 Encounter for screening mammogram for malignant neoplasm of breast: Secondary | ICD-10-CM

## 2015-01-05 ENCOUNTER — Ambulatory Visit (HOSPITAL_COMMUNITY)
Admission: RE | Admit: 2015-01-05 | Discharge: 2015-01-05 | Disposition: A | Discharge status: Home / Self Care | Payer: No Typology Code available for payment source | Source: Ambulatory Visit | Attending: Internal Medicine | Admitting: Internal Medicine

## 2015-01-05 DIAGNOSIS — Z1231 Encounter for screening mammogram for malignant neoplasm of breast: Secondary | ICD-10-CM

## 2015-01-24 ENCOUNTER — Telehealth: Payer: Self-pay | Admitting: Internal Medicine

## 2015-01-24 NOTE — Telephone Encounter (Signed)
Call to patient to confirm appointment for 01/25/15 at 10:15 lmtcb

## 2015-01-25 ENCOUNTER — Ambulatory Visit (INDEPENDENT_AMBULATORY_CARE_PROVIDER_SITE_OTHER): Payer: No Typology Code available for payment source | Admitting: Internal Medicine

## 2015-01-25 ENCOUNTER — Encounter: Payer: Self-pay | Admitting: Internal Medicine

## 2015-01-25 VITALS — BP 123/63 | HR 59 | Temp 97.4°F | Ht 63.5 in | Wt 161.2 lb

## 2015-01-25 DIAGNOSIS — M549 Dorsalgia, unspecified: Principal | ICD-10-CM

## 2015-01-25 DIAGNOSIS — G8929 Other chronic pain: Secondary | ICD-10-CM

## 2015-01-25 DIAGNOSIS — R51 Headache: Secondary | ICD-10-CM

## 2015-01-25 DIAGNOSIS — M545 Low back pain: Secondary | ICD-10-CM

## 2015-01-25 DIAGNOSIS — R42 Dizziness and giddiness: Secondary | ICD-10-CM

## 2015-01-25 DIAGNOSIS — Z87891 Personal history of nicotine dependence: Secondary | ICD-10-CM

## 2015-01-25 DIAGNOSIS — R351 Nocturia: Secondary | ICD-10-CM

## 2015-01-25 DIAGNOSIS — K589 Irritable bowel syndrome without diarrhea: Secondary | ICD-10-CM

## 2015-01-25 DIAGNOSIS — R519 Headache, unspecified: Secondary | ICD-10-CM

## 2015-01-25 DIAGNOSIS — Z90710 Acquired absence of both cervix and uterus: Secondary | ICD-10-CM

## 2015-01-25 DIAGNOSIS — R3581 Nocturnal polyuria: Secondary | ICD-10-CM

## 2015-01-25 DIAGNOSIS — F17200 Nicotine dependence, unspecified, uncomplicated: Secondary | ICD-10-CM

## 2015-01-25 LAB — POCT URINALYSIS DIPSTICK
Bilirubin, UA: NEGATIVE
Blood, UA: NEGATIVE
GLUCOSE UA: NEGATIVE
Ketones, UA: NEGATIVE
Nitrite, UA: NEGATIVE
Protein, UA: NEGATIVE
SPEC GRAV UA: 1.02
UROBILINOGEN UA: 0.2
pH, UA: 6

## 2015-01-25 LAB — GLUCOSE, CAPILLARY: Glucose-Capillary: 90 mg/dL (ref 70–99)

## 2015-01-25 LAB — POCT GLYCOSYLATED HEMOGLOBIN (HGB A1C): HEMOGLOBIN A1C: 5.4

## 2015-01-25 NOTE — Assessment & Plan Note (Signed)
Patient reports history of partial hysterectomy and says ovaries remained and unsure about cervix, however, on chart review, CT imaging in 2001 notes ovaries removed and ovaries not visualized on follow up pelvic ultrasounds. Additionally, on chart review it seems cervix is noted to be absent however pap smear reports in the past report endocervical zone present. On last papsmear in clinic, cervix does not appear to be visualized and zone absent, however given prior review of records and difficult exam noted, I will refer her to GYN today just to be sure, likely repeat pap, and further clearance on if papsmears need to continue. I have tried to look for old surgical records but no records in epic dating that far back.   -GYN referral.

## 2015-01-25 NOTE — Assessment & Plan Note (Signed)
Mentioned at the end of the visit occasionally. No current complaints but says did feel some dizziness last week sometime.   -continue to monitor and evaluate further on follow up visit. Advised if persistent or recurrent to return sooner for follow up as this was not addressed during today's visit -consider EKG and HR monitoring on follow up given HR initially in high 50s but improved to low 60s on repeat check and possible symptomatic bradycardia.

## 2015-01-25 NOTE — Assessment & Plan Note (Signed)
Associated with hesitancy. On chart review similar complaints in 2013. CBG in clinic 90 with Hba1c 5.4 (family history of diabetes). Denies dysuria or vaginal discharge. Possibly from drinking water at night before bed contributing to frequency. Urine dipstick notable for trace leukocytes but negative for nitrite or blood or glucose.   -urine cx -monitor for now, try to avoid drinking water right before bed -if worsening or persistent,or develops dysuria asked to return to clinic for follow up and further evaluation, consider urology referral/bladder scan

## 2015-01-25 NOTE — Assessment & Plan Note (Addendum)
Follows at [place]. ?need for EGD. I have discussed patient request with maimie who will touch base with [facility] to see if they need for [place] to place order, although unlikely as she is being followed by GI there.  She has started a daily probiotic.

## 2015-01-25 NOTE — Assessment & Plan Note (Signed)
Still not smoking and commended on her smoking cessation.

## 2015-01-25 NOTE — Assessment & Plan Note (Signed)
Associated with hesitance. CBG in clinic 90 with Hba1c 5.4 (family history of diabetes). Denies dysuria. Possibly from drinking water at night before bed contributing to frequency. Urine dipstick notable for trace leukocytes but negative for nitrite or blood or glucose.   -urine cx -monitor for now, try to avoid drinking water right before bed -if worsening or persistent,or develops dysuria asked to return to clinic for follow up and further evaluation

## 2015-01-25 NOTE — Assessment & Plan Note (Addendum)
New onset, intermittent, severe at its worse, relieved with sleep and rest, associated with nausea and irritation with light. Starts around nose and radiates up to forehead and head ?migraine. Denies vision change or recent trauma or syncope. Noted to have hx of sinus headaches but currently no sinus pressure or pain on exam.   -headache diary for next 1-2 weeks with notes on when headaches occur, what she has eaten and was doing, how long they last, what helped if anything and review on follow up visit to be within next 2 weeks -trial of excedrin migraine that can be found over the counter -if persistent or worsening, may need imaging for further evaluation -advised to call or return sooner if headaches persist or worsening or any neurological symptoms/stroke like symptoms to go directly to the ED

## 2015-01-25 NOTE — Assessment & Plan Note (Addendum)
Acute on chronic lower back pain with radiation down right leg likely sciatica. Improvement noted in the past with PT and and when she went to see chiropractor. No loss of bowel or bladder or nerve like pain or tinging or numbness noted in extremities.  Last lumbar xray 05/2013:Moderate facet arthritis at L3-4 with grade 1 spondylolisthesis. Slight facet arthritis at L4-5.  -PT eval placed -can try heat or ice to area for relief -would avoid heavy lifting for now -stretching as tolerated -follow up in approximately 2 weeks if no improvement or sooner if worsening or any alarm symptoms.

## 2015-01-25 NOTE — Patient Instructions (Addendum)
General Instructions:   Please bring your medicines with you each time you come to clinic.  Medicines may include prescription medications, over-the-counter medications, herbal remedies, eye drops, vitamins, or other pills.   Dear Aimee Davis,  We will call you with results of the culture  In the meantime, please start physical therapy when scheduled. Continue to apply heat to area or try ice, avoid heavy lifting, and stretches as tolerated  If worsening of pain or no improvement call and let us know and return to clinic. If you have sudden loss of bowel or bladder or sudden weakness numbness and tingling of your extremities call us right away  You can try excedrin migraine for your headache and also keep a headache diary and bring to follow up visit. If your headaches are worsening or you have any stroke like symptoms, weakness, change in vision call us immediately or go directly to the emergency room  Progress Toward Treatment Goals:  Treatment Goal 12/04/2012  Stop smoking smoking the same amount    Self Care Goals & Plans:  Self Care Goal 12/11/2012  Manage my medications take my medicines as prescribed; bring my medications to every visit; refill my medications on time  Be physically active take a walk every day  Stop smoking (No Data)    No flowsheet data found.   Care Management & Community Referrals:  Referral 03/31/2013  Referrals made for care management support -  Referrals made to community resources exercise/physical therapy     Sciatica Sciatica is pain, weakness, numbness, or tingling along your sciatic nerve. The nerve starts in the lower back and runs down the back of each leg. Nerve damage or certain conditions pinch or put pressure on the sciatic nerve. This causes the pain, weakness, and other discomforts of sciatica. HOME CARE   Only take medicine as told by your doctor.  Apply ice to the affected area for 20 minutes. Do this 3-4 times a day for the first  48-72 hours. Then try heat in the same way.  Exercise, stretch, or do your usual activities if these do not make your pain worse.  Go to physical therapy as told by your doctor.  Keep all doctor visits as told.  Do not wear high heels or shoes that are not supportive.  Get a firm mattress if your mattress is too soft to lessen pain and discomfort. GET HELP RIGHT AWAY IF:   You cannot control when you poop (bowel movement) or pee (urinate).  You have more weakness in your lower back, lower belly (pelvis), butt (buttocks), or legs.  You have redness or puffiness (swelling) of your back.  You have a burning feeling when you pee.  You have pain that gets worse when you lie down.  You have pain that wakes you from your sleep.  Your pain is worse than past pain.  Your pain lasts longer than 4 weeks.  You are suddenly losing weight without reason. MAKE SURE YOU:   Understand these instructions.  Will watch this condition.  Will get help right away if you are not doing well or get worse. Document Released: 06/12/2008 Document Revised: 03/04/2012 Document Reviewed: 01/13/2012 [facility] Patient Information 2015 [place], [place]. This information is not intended to replace advice given to you by your health care provider. Make sure you discuss any questions you have with your health care provider.

## 2015-01-25 NOTE — Progress Notes (Signed)
Subjective:   Patient ID: Aimee Davis female   DOB: 26-Apr-1956 59 y.o.   MRN: [id number]  HPI: [name] is a 59 y.o. female with PMH as listed below presenting to opc today for acute visit.   She reports recurrent lower back pain with radiation down right leg, posterior thigh to back of knee. She says she has had back pain in the past that improved after PT and chiropractor visit. She denies any recent trauma or heavy lifting or remembering anything that could have triggered it. She has been having the pain past 2 months, worse when changing positions, sitting to standing or standing to sitting, worse when going up the stairs and harder to get up from lying position. The pain is sore and achy, 6/10 at its worst on pain scale, resolves on its own and intermittent, not relieved with ibuprofen and she has been using a heat pad. She does clean houses occasionally.   Nocturia--noticed past 2 months, gets up at night about 2-3 times per night, not always constant flow, reports hesitance but denies any dysuria or abdominal pain. She is worried she may have diabetes as several family members do. She also endorses feeling thirsty often.   Finally, she reports recent onset of severe headaches that come and go starting from around her nose up to forehead and across head to back. She denies any vision change but says she is bothered by light during that time and does have some nausea. She denies prior hx of headaches and says these are severe to the point that she cant do anything. She does not know what may trigger them, they spontaneously resolve on their own or if she lays down and sleeps. Last headache was on Saturday, then Friday before that and Wednesday. No current headache at this time. Denies improvement with nasal spray or ibuprofen or allergy medicine. Denies nasal congestion or runny nose, but feels like her eyes may get swollen during that time with her eye lids feeling heavy and coming  down to mid eye level (although that may be what they normally look like).   IBS--she follows with GI at [facility] and says they could not do EGD due to her orange card and she said she may need for [place] to place order. We will need to clarify with [facility] what is requested. Unable to afford lidocaine jelly as prescribed by physician at [place]. Still taking hyoscyamine.   Past Medical History  Diagnosis Date  . Allergy     seasonal, PCN, antidepressants, bentyl  . Asthma   . Carpal tunnel syndrome on both sides     right worse than left 2008  . Hyperlipidemia     LDL 108 JULY 2013  . Positive TB test     From [facility] 2009 note : Recent F/u at [facility]. CXRAy negative.  Positive TST in 2007 (45mm).  Unable to tolerate INH due to hepatotoxicity  . COPD (chronic obstructive pulmonary disease)   . Anemia   . GERD (gastroesophageal reflux disease)   . Chronic lower back pain    Current Outpatient Prescriptions  Medication Sig Dispense Refill  . albuterol (PROVENTIL HFA;VENTOLIN HFA) 108 (90 BASE) MCG/ACT inhaler Inhale 2 puffs into the lungs every 6 (six) hours as needed for wheezing. 1 Inhaler 6  . Artificial Tear Ointment (DRY EYES OP) Apply 1 drop to eye daily as needed (dry eyes).    Marland Kitchen esomeprazole (NEXIUM) 40 MG capsule Take 1 capsule (  40 mg total) by mouth daily before breakfast. 90 capsule 5  . fish oil-omega-3 fatty acids 1000 MG capsule Take 2 g by mouth daily.     . Flaxseed MISC Take by mouth See admin instructions. Teaspoonful of seed blended and mixed with yogurt    . Fluticasone-Salmeterol (ADVAIR DISKUS) 250-50 MCG/DOSE AEPB Inhale 1 puff into the lungs 2 (two) times daily. 60 each 4  . hyoscyamine (LEVSIN SL) 0.125 MG SL tablet Place 1 tablet (0.125 mg total) under the tongue every 4 (four) hours as needed for cramping. 30 tablet 1  . loratadine (CLARITIN) 10 MG tablet Take 10 mg by mouth daily as needed for allergies.     . mometasone (NASONEX) 50 MCG/ACT nasal spray Place 2  sprays in each nostril once daily as needed 17 g 3  . Multiple Vitamin (MULTIVITAMIN) capsule Take 1 capsule by mouth daily.     No current facility-administered medications for this visit.   Family History  Problem Relation Age of Onset  . Hypertension Mother   . Hyperlipidemia Mother   . Heart disease Mother   . Diabetes Mother   . Kidney disease Mother   . Stroke Father   . Diabetes Father   . Heart disease Father   . Hyperlipidemia Father   . Hypertension Father   . Kidney disease Father   . Cancer Sister   . Mental illness Sister   . Diabetes Sister   . Kidney disease Sister   . Hypertension Sister   . Mental illness Brother   . Asthma Son   . Hypertension Maternal Aunt   . Hypertension Maternal Uncle   . Hypertension Paternal Aunt   . Kidney disease Paternal Aunt   . Hypertension Paternal Uncle   . Kidney disease Paternal Uncle    History   Social History  . Marital Status: Divorced    Spouse Name: N/A  . Number of Children: N/A  . Years of Education: N/A   Social History Main Topics  . Smoking status: Former Smoker -- 0.33 packs/day for 43 years    Types: Cigarettes    Quit date: 01/06/2013  . Smokeless tobacco: Never Used  . Alcohol Use: No  . Drug Use: No  . Sexual Activity: Not on file     Comment: S/P PARTIAL HYSTERECTOMY   Other Topics Concern  . Not on file   Social History Narrative   Review of Systems:  Constitutional:  Denies fever, chills   HEENT:  Denies congestion  Respiratory:  Denies SOB  Cardiovascular:  Denies chest pain  Gastrointestinal:  Nausea with headaches and intermittent abdominal pain  Genitourinary:  Denies dysuria. +hesitancy and nocturia. Denies loss of bowel or bladder.   Musculoskeletal:  Back pain radiating down right leg. Denies any numbness or tingling of weakness.   Neurological:  Headaches but not at this time. Denies syncope.    Objective:  Physical Exam: Filed Vitals:   01/25/15 1040  BP: 123/63  Pulse:  59; HR improved to 62 on repeat check  Temp: 97.4 F (36.3 C)  TempSrc: Oral  Height: 5' 3.5" (1.613 m)  Weight: 161 lb 3.2 oz (73.12 kg)  SpO2: 98%   Vitals reviewed. General: sitting in chair, NAD HEENT: EOMI, PERRL Cardiac: RRR Pulm: clear to auscultation bilaterally, no wheezes, rales, or rhonchi Abd: soft, nontender, BS present Ext: warm and well perfused, no pedal edema, moving all extremities, tenderness to deep palpation lower back midline above hips and radiating to  b/l sides. +straight leg raise with right leg and noted pain coming down posterior right thigh to knee. Able to bend down to her toes but reported some pain when standing back up. Pain worse when leaning back, able to rotate without complaints and bend to the right and left with minimal pain.  Neuro: alert and oriented X3, no facial droop or slurred speech noted, following commands, able to show teeth and shrug shoulders, no tongue deviation, strength and sensation to light touch equal in bilateral upper and lower extremities and on face on b/l sides. Denies numbness or tingling in extremities.   Assessment & Plan:  Discussed with Dr. Dareen Piano

## 2015-01-26 LAB — URINE CULTURE
Colony Count: NO GROWTH
Organism ID, Bacteria: NO GROWTH

## 2015-01-27 ENCOUNTER — Encounter: Payer: Self-pay | Admitting: *Deleted

## 2015-02-01 ENCOUNTER — Telehealth: Payer: Self-pay | Admitting: Internal Medicine

## 2015-02-01 NOTE — Telephone Encounter (Signed)
I called Ms. Hadley this evening to review the recommendations of GYN who do NOT recommend a repeat papsmear at this time as it is not indicated per the guidelines. I reviewed the discrepancy in records of if her cervix is still present with some documentation saying absent but papresults having endocervical zone present in the past. Dr. Harolyn Rutherford clarified that even if residual cervix, pap was normal with negative HRHPV and does not need another exam for 5 years and then she will be 63 but that they can offer an exam for reassurance without a papsmear. I reviewed all this with the patient again today and she would still like to seen for reassurance and she will also try to get records and clarification of her surgical history. As such, I have let the GYN clinic know of her request and she can be scheduled based on availability.   She is also still awaiting PT appointment and I will ask Maimie and Lela the status on referral and to let the patient know as well.   We also reviewed her urine culture that showed no growth. She thanked me for the call and she is welcome to call the clinic if she has any further questions or concerns.

## 2015-02-05 ENCOUNTER — Other Ambulatory Visit: Payer: Self-pay | Admitting: Internal Medicine

## 2015-02-05 DIAGNOSIS — K589 Irritable bowel syndrome without diarrhea: Secondary | ICD-10-CM

## 2015-02-08 ENCOUNTER — Ambulatory Visit: Payer: No Typology Code available for payment source | Attending: Internal Medicine | Admitting: Physical Therapy

## 2015-02-08 DIAGNOSIS — M545 Low back pain, unspecified: Secondary | ICD-10-CM

## 2015-02-08 DIAGNOSIS — M2560 Stiffness of unspecified joint, not elsewhere classified: Secondary | ICD-10-CM | POA: Insufficient documentation

## 2015-02-08 DIAGNOSIS — R209 Unspecified disturbances of skin sensation: Secondary | ICD-10-CM

## 2015-02-08 DIAGNOSIS — R208 Other disturbances of skin sensation: Secondary | ICD-10-CM | POA: Insufficient documentation

## 2015-02-08 DIAGNOSIS — M79604 Pain in right leg: Secondary | ICD-10-CM

## 2015-02-08 NOTE — Therapy (Signed)
[place], [place], [address] Phone: [phone]   Fax:  [phone]  Physical Therapy Evaluation  Patient Details  Name: Aimee Davis MRN: [id number] Date of Birth: 10-16-1955 Referring Provider:  Wilber Oliphant, MD  Encounter Date: 02/08/2015      PT End of Session - 02/08/15 1442    Visit Number 1   Number of Visits 16   Date for PT Re-Evaluation 04/05/15   PT Start Time 1100   PT Stop Time 1145   PT Time Calculation (min) 45 min   Activity Tolerance Patient tolerated treatment well;Patient limited by pain      Past Medical History  Diagnosis Date  . Allergy     seasonal, PCN, antidepressants, bentyl  . Asthma   . Carpal tunnel syndrome on both sides     right worse than left 2008  . Hyperlipidemia     LDL 108 JULY 2013  . Positive TB test     From MArch 2009 note : Recent F[facility] at [facility]. CXRAy negative.  Positive TST in 2007 (49mm).  Unable to tolerate INH due to hepatotoxicity  . COPD (chronic obstructive pulmonary disease)   . Anemia   . GERD (gastroesophageal reflux disease)   . Chronic lower back pain     Past Surgical History  Procedure Laterality Date  . Induced abortion  2005  . Vaginal hysterectomy  1983    partial; for abnormal bleeding  . Breast lumpectomy Right 1990's  . Breast cyst excision Right 2011    There were no vitals filed for this visit.  Visit Diagnosis:  Lumbar pain with radiation down right leg - Plan: PT plan of care cert/re-cert  Joint stiffness of spine - Plan: PT plan of care cert/re-cert  Sensory disturbance - Plan: PT plan of care cert/re-cert      Subjective Assessment - 02/08/15 1100    Subjective Pain in low back "for years" (acute on chronic) but worsened approx. 2 weeks ago. Yesterday had severe pain in low back, could "barely get out of bed".  Pain/sx shoot/ radiate to Rt. post thigh and knee/calf.  She has numbness/tingling in Rt. LE.      Pertinent History Rt. knee pain, chronic LBP   Limitations Other (comment);Lifting;Standing;Walking;House hold activities;Sitting  getting up and down, walking up stairs, reaching up high (spine extension)   How long can you sit comfortably? 45 min   How long can you stand comfortably? 60 min    How long can you walk comfortably? not limited   Diagnostic tests  Last lumbar xray 05/2013:Moderate facet arthritis at L3-4 with grade 1 spondylolisthesis.   Patient Stated Goals pain relief   Currently in Pain? Yes   Pain Score 8    Pain Location Back   Pain Orientation Lower   Pain Descriptors / Indicators Sharp;Tightness;Shooting   Pain Type Chronic pain   Pain Radiating Towards Rt thigh    Pain Onset More than a month ago   Pain Frequency Intermittent   Aggravating Factors  getting up, sitting, reaching, squatting   Pain Relieving Factors warm water, gentle walking, aspercreme   Multiple Pain Sites No            [facility] PT Assessment - 02/08/15 1107    Assessment   Medical Diagnosis back pain    Onset Date/Surgical Date --  chronic 20 +   Prior Therapy yes   Precautions   Precautions None   Restrictions  Weight Bearing Restrictions No   Balance Screen   Has the patient fallen in the past 6 months No   [place] residence   Living Arrangements Alone   Type of [place] to enter   Entrance Stairs-Number of Steps 6   Entrance Stairs-Rails Right   Prior Function   Level of Independence Independent with household mobility without device   Vocation Part time employment   Cognition   Overall Cognitive Status Within Functional Limits for tasks assessed   Observation/Other Assessments   Focus on Therapeutic Outcomes (FOTO)  not done    Sensation   Light Touch Appears Intact   Coordination   Gross Motor Movements are Fluid and Coordinated Yes   Posture/Postural Control   Posture/Postural Control No significant  limitations   AROM   Lumbar Flexion 80  significant pain and difficulty rising to stand from flexion   Lumbar Extension 20   Lumbar - Right Side Bend 25   Lumbar - Left Side Bend 15   Lumbar - Right Rotation seated WFL   Lumbar - Left Rotation seated WFL   Strength   Right Hip Flexion 3+/5   Right Hip Extension 3-/5   Right Hip ABduction 3-/5   Left Hip Flexion 3+/5   Left Hip Extension 3/5   Right Knee Flexion 3+/5   Right Knee Extension 5/5   Left Knee Flexion 5/5   Left Knee Extension 5/5   Right Ankle Dorsiflexion 3+/5   Left Ankle Dorsiflexion 5/5   Palpation   SI assessment  positive SLR on Rt. LE    Palpation comment significant tightness in Rt. lumbar parapsinals and Rt. QL, sore into gluteals and into lateral SI border             [facility] Adult PT Treatment/Exercise - 02/08/15 1107    Lumbar Exercises: Prone   Other Prone Lumbar Exercises prone press up on elbows x 5   Other Prone Lumbar Exercises sidelying Transverse Abd x 10    Cryotherapy   Number Minutes Cryotherapy 10 Minutes   Cryotherapy Location Lumbar Spine   Type of Cryotherapy Ice pack                PT Education - 02/08/15 1441    Education provided Yes   Education Details prone ext, nerve root irritation   Person(s) Educated Patient   Methods Explanation;Demonstration;Handout   Comprehension Verbalized understanding;Returned demonstration          PT Short Term Goals - 02/08/15 1450    PT SHORT TERM GOAL #1   Title Pt will be I with initial HEP   Time 4   Period Weeks   Status New   PT SHORT TERM GOAL #2   Title Pt will be able to stand from a sitting position without difficulty most of the time.    Time 4   Period Weeks   Status New   PT SHORT TERM GOAL #3   Title Pt will report improvement in LE symptoms to minimal (pain, tingling)   Time 4   Period Weeks   Status New           PT Long Term Goals - 02/08/15 1451    PT LONG TERM GOAL #1   Title Pt will be I with  advanced HEP   Time 8   Period Weeks   Status New   PT LONG TERM GOAL #2  Title Pt will be able to demo safe lifting techniques and understand posture, body mech    Time 8   Period Weeks   Status New   PT LONG TERM GOAL #3   Title Pt will be able to sit for 45 min and report min discomfort in low back   Time 8   Period Weeks   Status New   PT LONG TERM GOAL #4   Title Pt will be able to walk for fitness for 30 min x >/=3 days per week, no increase in leg pain   Time 8   Period Weeks   Status New   PT LONG TERM GOAL #5   Title Pt will be able to reach, squat and lift mod sized items without exacerbation of pain.    Time 8   Period Weeks   Status New               Plan - 02/08/15 1444    Clinical Impression Statement This patient demonstrated a pos SLR on Rt. side, signs and symptoms consistent with disc/nerve root irritation.  She also has knee flexion and DF weakness on Rt. side (L5-S1). She was given prone ext which relieved her back and leg pain.  She will benefit from skilled PT to restore functionaal mobility.    Pt will benefit from skilled therapeutic intervention in order to improve on the following deficits Decreased range of motion;Difficulty walking;Increased fascial restricitons;Impaired UE functional use;Increased muscle spasms;Decreased endurance;Decreased activity tolerance;Pain;Improper body mechanics;Impaired flexibility;Decreased strength;Decreased mobility   Rehab Potential Good   PT Frequency 2x / week   PT Duration 8 weeks   PT Treatment/Interventions ADLs/Self Care Home Management;Functional mobility training;Patient/family education;Passive range of motion;Dry needling;Therapeutic activities;Moist Heat;Traction;Ultrasound;Cryotherapy;Electrical Stimulation;Stair training;Gait training;Taping;Balance training;Manual techniques;Neuromuscular re-education;Therapeutic exercise   PT Next Visit Plan review ext ex, try to release Rt. QL in sidelying,  modailities vs traction   PT Home Exercise Plan prone on elbows, Tranverse Abd, and stand ext.    Consulted and Agree with Plan of Care Patient         Problem List Patient Active Problem List   Diagnosis Date Noted  . Headache 01/25/2015  . H/O: hysterectomy 01/25/2015  . Orthostatic hypotension 11/18/2014  . Papulovesicular rash RLE 02/09/2014  . Carbon monoxide exposure 10/27/2013  . Dizziness, nonspecific 10/27/2013  . Fatigue 07/16/2013  . Sensorineural hearing loss of both ears 07/16/2013  . Cataract 06/11/2013  . Healthcare maintenance 06/11/2013  . Patellofemoral pain syndrome--R KNEE 03/17/2013  . Positive TB test   . Insomnia 06/05/2012  . Sinus headache 06/05/2012  . Back pain, acute on chronic 06/05/2012  . Nocturnal polyuria 06/05/2012  . COPD 03/17/2010  . ABDOMINAL PAIN 08/16/2008  . CARPAL TUNNEL SYNDROME, BILATERAL 03/29/2008  . CONSTIPATION 01/28/2008  . ASTHMA, PERSISTENT, MODERATE 01/31/2007  . DEPRESSION, MAJOR, RECURRENT 11/14/2006  . TOBACCO DEPENDENCE 11/14/2006  . GASTROESOPHAGEAL REFLUX, NO ESOPHAGITIS 11/14/2006  . Irritable bowel syndrome 11/14/2006    PAA,JENNIFER 02/08/2015, 2:55 PM  [facility] [address] [place], [place], [address] Phone: [phone]   Fax:  [phone]

## 2015-02-08 NOTE — Patient Instructions (Signed)
    Lying on back with knees bent, tighten stomach by pressing elbows down. Hold ___5_ seconds. Repeat _10___ times per set. Do __2__ sets per session. Do __2__ sessions per day.     http://orth.exer.us/93   Copyright  VHI. All rights reserved.  Pelvic Press   Place hands under belly between navel and pubic bone, palms up. Feel pressure on hands. Increase pressure on hands by pressing pelvis down. This is NOT a pelvic tilt. Hold __5_ seconds. Relax. Repeat ___10 times.  Copyright  VHI. All rights reserved.  On Elbows (Prone)   Rise up on elbows as high as possible, keeping hips on floor. Hold __10__ seconds. Repeat ___10_ times per set. Do ___2_ sets per session. Do __2-3 __ sessions per day. OR AS NEEDED FOR LEG PAIN  http://orth.exer.us/93   Copyright  VHI. All rights reserved.

## 2015-02-09 ENCOUNTER — Other Ambulatory Visit: Payer: Self-pay | Admitting: *Deleted

## 2015-02-09 DIAGNOSIS — K589 Irritable bowel syndrome without diarrhea: Secondary | ICD-10-CM

## 2015-02-09 NOTE — Telephone Encounter (Signed)
Hi Helen and Chester,  I believe the Levsin is prescribed from her GI [facility]. Can we please touch base with patient or her GI physicians if she is to still be on this?   Thanks,  Dr q

## 2015-02-15 ENCOUNTER — Ambulatory Visit (INDEPENDENT_AMBULATORY_CARE_PROVIDER_SITE_OTHER): Payer: No Typology Code available for payment source | Admitting: Internal Medicine

## 2015-02-15 ENCOUNTER — Encounter: Payer: Self-pay | Admitting: Internal Medicine

## 2015-02-15 VITALS — BP 108/67 | HR 68 | Temp 97.7°F | Ht 63.5 in | Wt 159.4 lb

## 2015-02-15 DIAGNOSIS — M549 Dorsalgia, unspecified: Principal | ICD-10-CM

## 2015-02-15 DIAGNOSIS — M545 Low back pain: Secondary | ICD-10-CM

## 2015-02-15 DIAGNOSIS — G8929 Other chronic pain: Secondary | ICD-10-CM

## 2015-02-15 MED ORDER — CYCLOBENZAPRINE HCL 5 MG PO TABS: 5.0000 mg | ORAL_TABLET | Freq: Three times a day (TID) | ORAL | Status: DC | PRN

## 2015-02-15 MED ORDER — CYCLOBENZAPRINE HCL 5 MG PO TABS
5.0000 mg | ORAL_TABLET | Freq: Three times a day (TID) | ORAL | Status: DC | PRN
Start: 2015-02-15 — End: 2015-04-14

## 2015-02-15 NOTE — Patient Instructions (Addendum)
General Instructions: We will get an MRI of your back Please take naprosyn 500 mg twice a day  Please try Flexeril 5 mg three times a day as needed for back spasms Please come back in one month   Thank you for bringing your medicines today. This helps [place] keep you safe from mistakes.   Progress Toward Treatment Goals:  Treatment Goal 12/04/2012  Stop smoking smoking the same amount    Self Care Goals & Plans:  Self Care Goal 12/11/2012  Manage my medications take my medicines as prescribed; bring my medications to every visit; refill my medications on time  Be physically active take a walk every day  Stop smoking (No Data)    No flowsheet data found.   Care Management & Community Referrals:  Referral 03/31/2013  Referrals made for care management support -  Referrals made to community resources exercise/physical therapy     Back Pain, Adult Back pain is very common. The pain often gets better over time. The cause of back pain is usually not dangerous. Most people can learn to manage their back pain on their own.  HOME CARE   Stay active. Start with short walks on flat ground if you can. Try to walk farther each day.  Do not sit, drive, or stand in one place for more than 30 minutes. Do not stay in bed.  Do not avoid exercise or work. Activity can help your back heal faster.  Be careful when you bend or lift an object. Bend at your knees, keep the object close to you, and do not twist.  Sleep on a firm mattress. Lie on your side, and bend your knees. If you lie on your back, put a pillow under your knees.  Only take medicines as told by your doctor.  Put ice on the injured area.  Put ice in a plastic bag.  Place a towel between your skin and the bag.  Leave the ice on for 15-20 minutes, 03-04 times a day for the first 2 to 3 days. After that, you can switch between ice and heat packs.  Ask your doctor about back exercises or massage.  Avoid feeling anxious or  stressed. Find good ways to deal with stress, such as exercise. GET HELP RIGHT AWAY IF:   Your pain does not go away with rest or medicine.  Your pain does not go away in 1 week.  You have new problems.  You do not feel well.  The pain spreads into your legs.  You cannot control when you poop (bowel movement) or pee (urinate).  Your arms or legs feel weak or lose feeling (numbness).  You feel sick to your stomach (nauseous) or throw up (vomit).  You have belly (abdominal) pain.  You feel like you may pass out (faint). MAKE SURE YOU:   Understand these instructions.  Will watch your condition.  Will get help right away if you are not doing well or get worse. Document Released: 02/20/2008 Document Revised: 11/26/2011 Document Reviewed: 01/05/2014 [facility] Patient Information 2015 [place], [place]. This information is not intended to replace advice given to you by your health care provider. Make sure you discuss any questions you have with your health care provider.   Cyclobenzaprine tablets What is this medicine? CYCLOBENZAPRINE (sye kloe BEN za preen) is a muscle relaxer. It is used to treat muscle pain, spasms, and stiffness. This medicine may be used for other purposes; ask your health care provider or pharmacist if you  have questions. COMMON BRAND NAME(S): Fexmid, Flexeril What should I tell my health care provider before I take this medicine? They need to know if you have any of these conditions: -heart disease, irregular heartbeat, or previous heart attack -liver disease -thyroid problem -an unusual or allergic reaction to cyclobenzaprine, tricyclic antidepressants, lactose, other medicines, foods, dyes, or preservatives -pregnant or trying to get pregnant -breast-feeding How should I use this medicine? Take this medicine by mouth with a glass of water. Follow the directions on the prescription label. If this medicine upsets your stomach, take it with food or milk.  Take your medicine at regular intervals. Do not take it more often than directed. Talk to your pediatrician regarding the use of this medicine in children. Special care may be needed. Overdosage: If you think you have taken too much of this medicine contact a poison control center or emergency room at once. NOTE: This medicine is only for you. Do not share this medicine with others. What if I miss a dose? If you miss a dose, take it as soon as you can. If it is almost time for your next dose, take only that dose. Do not take double or extra doses. What may interact with this medicine? Do not take this medicine with any of the following medications: -certain medicines for fungal infections like fluconazole, itraconazole, ketoconazole, posaconazole, voriconazole -cisapride -dofetilide -dronedarone -droperidol -flecainide -grepafloxacin -halofantrine -levomethadyl -MAOIs like Carbex, Eldepryl, Marplan, Nardil, and Parnate -nilotinib -pimozide -probucol -sertindole -thioridazine -ziprasidone This medicine may also interact with the following medications: -abarelix -alcohol -certain medicines for cancer -certain medicines for depression, anxiety, or psychotic disturbances -certain medicines for infection like alfuzosin, chloroquine, clarithromycin, levofloxacin, mefloquine, pentamidine, troleandomycin -certain medicines for an irregular heart beat -certain medicines used for sleep or numbness during surgery or procedure -contrast dyes -dolasetron -guanethidine -methadone -octreotide -ondansetron -other medicines that prolong the QT interval (cause an abnormal heart rhythm) -palonosetron -phenothiazines like chlorpromazine, mesoridazine, prochlorperazine, thioridazine -tramadol -vardenafil This list may not describe all possible interactions. Give your health care provider a list of all the medicines, herbs, non-prescription drugs, or dietary supplements you use. Also tell them  if you smoke, drink alcohol, or use illegal drugs. Some items may interact with your medicine. What should I watch for while using this medicine? Check with your doctor or health care professional if your condition does not improve within 1 to 3 weeks. You may get drowsy or dizzy when you first start taking the medicine or change doses. Do not drive, use machinery, or do anything that may be dangerous until you know how the medicine affects you. Stand or sit up slowly. Your mouth may get dry. Drinking water, chewing sugarless gum, or sucking on hard candy may help. What side effects may I notice from receiving this medicine? Side effects that you should report to your doctor or health care professional as soon as possible: -allergic reactions like skin rash, itching or hives, swelling of the face, lips, or tongue -chest pain -fast heartbeat -hallucinations -seizures -vomiting Side effects that usually do not require medical attention (report to your doctor or health care professional if they continue or are bothersome): -headache This list may not describe all possible side effects. Call your doctor for medical advice about side effects. You may report side effects to FDA at 1-800-FDA-1088. Where should I keep my medicine? Keep out of the reach of children. Store at room temperature between 15 and 30 degrees C (59 and 86 degrees F). Keep  container tightly closed. Throw away any unused medicine after the expiration date. NOTE: This sheet is a summary. It may not cover all possible information. If you have questions about this medicine, talk to your doctor, pharmacist, or health care provider.  2015, Elsevier/Gold Standard. (2013-03-31 12:48:19)

## 2015-02-15 NOTE — Assessment & Plan Note (Addendum)
Assessment: Most likely diagnosis lumbar DDD as been on X ray in 2014. No acute neurological symptoms. However, she does have increasing muscle spasms and what sounds like sciatica on the right side. Her weight has been stable and she does not have constitutional symptoms. Therefore, I do not suspect malignancy or infectious etiology. However, malignancy has to be kept in mind given that she is 59 years old. I also wonder whether her recent PT could have worsened her pain and brought on the symptoms. She is anxious about her condition since the spasms are debilitating sometimes.   Plan: 1. Labs/imaging: Lumbar spine MRI without contrast 2. Therapy: Recommended Aleve 500 mg twice a day taken with food. Also trial of Flexeril 5 mg 3 times a day when necessary, starting with 5 mg at bedtime, and escalating over the next few days. Discussed side effects and provided her with information on this medication. Also provided her with home regimens for chronic back pain. 3. Follow up: Return in one month to see PCP. Will contact her with the results of MRI. She may need referral to back surgery, depending on the results of the MRI.
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
## 2015-02-16 ENCOUNTER — Encounter: Payer: No Typology Code available for payment source | Admitting: Obstetrics & Gynecology

## 2015-02-16 NOTE — Progress Notes (Signed)
Pt aware MRI sch at [place] 02/25/15 5PM - no restrictions. Hilda Blades Lynlee Stratton RN 02/16/15 8:55AM

## 2015-02-18 ENCOUNTER — Encounter: Payer: No Typology Code available for payment source | Admitting: Obstetrics & Gynecology

## 2015-02-20 NOTE — Progress Notes (Signed)
INTERNAL MEDICINE TEACHING ATTENDING ADDENDUM - Vernel Donlan, MD: I reviewed and discussed at the time of visit with the resident Dr. Qureshi, the patient's medical history, physical examination, diagnosis and results of pertinent tests and treatment and I agree with the patient's care as documented.  

## 2015-02-21 NOTE — Progress Notes (Signed)
Case discussed with Dr. Kazibwe soon after the resident saw the patient.  We reviewed the resident's history and exam and pertinent patient test results.  I agree with the assessment, diagnosis, and plan of care documented in the resident's note. 

## 2015-02-22 ENCOUNTER — Ambulatory Visit: Payer: No Typology Code available for payment source | Attending: Internal Medicine | Admitting: Physical Therapy

## 2015-02-22 DIAGNOSIS — M2560 Stiffness of unspecified joint, not elsewhere classified: Secondary | ICD-10-CM

## 2015-02-22 DIAGNOSIS — M545 Low back pain, unspecified: Secondary | ICD-10-CM

## 2015-02-22 DIAGNOSIS — M79604 Pain in right leg: Secondary | ICD-10-CM

## 2015-02-22 DIAGNOSIS — R208 Other disturbances of skin sensation: Secondary | ICD-10-CM | POA: Insufficient documentation

## 2015-02-22 NOTE — Therapy (Signed)
[place], [place], [address] Phone: [phone]   Fax:  [phone]  Physical Therapy Treatment  Patient Details  Name: Aimee Davis MRN: [id number] Date of Birth: [birth year] Referring Provider:  Wilber Oliphant, MD  Encounter Date: 02/22/2015      PT End of Session - 02/22/15 1253    Visit Number 2   Number of Visits 16   Date for PT Re-Evaluation 04/05/15   PT Start Time 1694   PT Stop Time 1247   PT Time Calculation (min) 49 min   Activity Tolerance Patient tolerated treatment well;Patient limited by pain   Behavior During Therapy [facility] for tasks assessed/performed      Past Medical History  Diagnosis Date  . Allergy     seasonal, PCN, antidepressants, bentyl  . Asthma   . Carpal tunnel syndrome on both sides     right worse than left 2008  . Hyperlipidemia     LDL 108 JULY 2013  . Positive TB test     From MArch 2009 note : [facility] F[facility]. CXRAy negative.  Positive TST in 2007 (54m).  Unable to tolerate INH due to hepatotoxicity  . COPD (chronic obstructive pulmonary disease)   . Anemia   . GERD (gastroesophageal reflux disease)   . Chronic lower back pain     Past Surgical History  Procedure Laterality Date  . Induced abortion  2005  . Vaginal hysterectomy  1983    partial; for abnormal bleeding  . Breast lumpectomy Right 1990's  . Breast cyst excision Right 2011    There were no vitals filed for this visit.  Visit Diagnosis:  Lumbar pain with radiation down right leg  Joint stiffness of spine      Subjective Assessment - 02/22/15 1159    Subjective Has pain 6/10   Currently in Pain? Yes   Pain Score 6    Pain Location Back   Pain Orientation Lower   Pain Descriptors / Indicators Aching;Sharp;Spasm   Pain Type Chronic pain   Pain Radiating Towards Rt thigh to knee  she also has knee pain as well  (on steps 4/10 now on steps 9/10 )  Rt   Aggravating Factors   getting up, standing, reaching and squatting   Pain Relieving Factors cold pack                         [facility] Adult PT Treatment/Exercise - 02/22/15 1200    Moist Heat Therapy   Number Minutes Moist Heat 15 Minutes   Moist Heat Location --  back   Electrical Stimulation   Electrical Stimulation Location Rt Low Back   Electrical Stimulation Action IFC   Electrical Stimulation Parameters 6   Electrical Stimulation Goals Pain  sidelying   Manual Therapy   Manual Therapy Soft tissue mobilization   Manual therapy comments Sidelying to stretch Quadratus , soft tissue work, light pressure quadratus strumming light , piriformis, gluteal, low back areas.                 PT Education - 02/22/15 1252    Education provided Yes   Education Details RICE   Person(s) Educated Patient   Methods Explanation;Handout   Comprehension Verbalized understanding          PT Short Term Goals - 02/08/15 1450    PT SHORT TERM GOAL #1   Title Pt will be I with  initial HEP   Time 4   Period Weeks   Status New   PT SHORT TERM GOAL #2   Title Pt will be able to stand from a sitting position without difficulty most of the time.    Time 4   Period Weeks   Status New   PT SHORT TERM GOAL #3   Title Pt will report improvement in LE symptoms to minimal (pain, tingling)   Time 4   Period Weeks   Status New           PT Long Term Goals - 02/08/15 1451    PT LONG TERM GOAL #1   Title Pt will be I with advanced HEP   Time 8   Period Weeks   Status New   PT LONG TERM GOAL #2   Title Pt will be able to demo safe lifting techniques and understand posture, body mech    Time 8   Period Weeks   Status New   PT LONG TERM GOAL #3   Title Pt will be able to sit for 45 min and report min discomfort in low back   Time 8   Period Weeks   Status New   PT LONG TERM GOAL #4   Title Pt will be able to walk for fitness for 30 min x >/=3 days per week, no increase in leg pain    Time 8   Period Weeks   Status New   PT LONG TERM GOAL #5   Title Pt will be able to reach, squat and lift mod sized items without exacerbation of pain.    Time 8   Period Weeks   Status New               Plan - 02/22/15 1253    Clinical Impression Statement Quadratus Lumborum RT very sensitive Manual light only,  No new goals yet met.   PT Next Visit Plan review ext ex, try to release Rt. QL in sidelying, if needed , See PT PLAN 1 st visit   Consulted and Agree with Plan of Care Patient        Problem List Patient Active Problem List   Diagnosis Date Noted  . Headache 01/25/2015  . H/O: hysterectomy 01/25/2015  . Orthostatic hypotension 11/18/2014  . Papulovesicular rash RLE 02/09/2014  . Carbon monoxide exposure 10/27/2013  . Dizziness, nonspecific 10/27/2013  . Fatigue 07/16/2013  . Sensorineural hearing loss of both ears 07/16/2013  . Cataract 06/11/2013  . Healthcare maintenance 06/11/2013  . Patellofemoral pain syndrome--R KNEE 03/17/2013  . Positive TB test   . Insomnia 06/05/2012  . Sinus headache 06/05/2012  . Back pain, acute on chronic 06/05/2012  . Nocturnal polyuria 06/05/2012  . COPD 03/17/2010  . ABDOMINAL PAIN 08/16/2008  . CARPAL TUNNEL SYNDROME, BILATERAL 03/29/2008  . CONSTIPATION 01/28/2008  . ASTHMA, PERSISTENT, MODERATE 01/31/2007  . DEPRESSION, MAJOR, RECURRENT 11/14/2006  . TOBACCO DEPENDENCE 11/14/2006  . GASTROESOPHAGEAL REFLUX, NO ESOPHAGITIS 11/14/2006  . Irritable bowel syndrome 11/14/2006    [facility] 02/22/2015, 12:58 PM  [facility] [address] [place], [place], [address] Phone: [phone]   Fax:  [phone]  Melvenia Needles, PTA 02/22/2015 12:58 PM Phone: [phone] Fax: [phone]

## 2015-02-23 ENCOUNTER — Encounter: Payer: No Typology Code available for payment source | Admitting: Obstetrics & Gynecology

## 2015-02-23 NOTE — Progress Notes (Addendum)
Pt came in for appt today.  When asked what her appt is for she stated "she did not know".  I informed pt that she was scheduled for a pelvic exam.  I asked pt if she had a vaginal hysterectomy in 1983, which is documented in her chart.  Pt stated "well, yeah I had one in 40' after my son turned 59 years old."  I explained to the patient that she does not require a pap smear because she does not have a cervix.  Pt then states "I just had a procedure and they told me that I did not have a cervix; I wonder what they took during my surgery?"  I advised pt to contact the provider or facility that did her hysterectomy and pt stated that the provider was now deceased but she thought the provider belonged to [facility].  Pt then asks "why did I even get the surgery?"  I verified with pt if she had any other gyn issues.  She stated "no, I did not have any other issues." I advised pt that I was not sure but the place she could start is I could give her contact # to Medical Records, she will need to fill out a ROI, and request her operative report for her hysterectomy since she thinks it may have been in [facility].  Pt stated "thank you" and did not have any further questions or concerns.

## 2015-02-24 NOTE — Progress Notes (Signed)
This encounter was created in error - please disregard.

## 2015-02-25 ENCOUNTER — Ambulatory Visit (HOSPITAL_COMMUNITY): Admission: RE | Admit: 2015-02-25 | Payer: No Typology Code available for payment source | Source: Ambulatory Visit

## 2015-02-25 ENCOUNTER — Ambulatory Visit: Payer: No Typology Code available for payment source | Admitting: Physical Therapy

## 2015-02-25 DIAGNOSIS — M545 Low back pain, unspecified: Secondary | ICD-10-CM

## 2015-02-25 DIAGNOSIS — M79604 Pain in right leg: Secondary | ICD-10-CM

## 2015-02-25 DIAGNOSIS — R209 Unspecified disturbances of skin sensation: Secondary | ICD-10-CM

## 2015-02-25 DIAGNOSIS — M2560 Stiffness of unspecified joint, not elsewhere classified: Secondary | ICD-10-CM

## 2015-02-25 NOTE — Patient Instructions (Signed)

## 2015-02-25 NOTE — Therapy (Signed)
[place], [place], [address] Phone: [phone]   Fax:  [phone]  Physical Therapy Treatment  Patient Details  Name: Aimee Davis MRN: [id number] Date of Birth: 09/04/1956 Referring Provider:  Wilber Oliphant, MD  Encounter Date: 02/25/2015      PT End of Session - 02/25/15 1031    Visit Number 3   Number of Visits 16   Date for PT Re-Evaluation 04/05/15   PT Start Time 1020   PT Stop Time 1120   PT Time Calculation (min) 60 min   Activity Tolerance Patient tolerated treatment well   Behavior During Therapy [facility] for tasks assessed/performed      Past Medical History  Diagnosis Date  . Allergy     seasonal, PCN, antidepressants, bentyl  . Asthma   . Carpal tunnel syndrome on both sides     right worse than left 2008  . Hyperlipidemia     LDL 108 JULY 2013  . Positive TB test     From MArch 2009 note : Recent F/u at [facility]. CXRAy negative.  Positive TST in 2007 (67m).  Unable to tolerate INH due to hepatotoxicity  . COPD (chronic obstructive pulmonary disease)   . Anemia   . GERD (gastroesophageal reflux disease)   . Chronic lower back pain     Past Surgical History  Procedure Laterality Date  . Induced abortion  2005  . Vaginal hysterectomy  1983    partial; for abnormal bleeding  . Breast lumpectomy Right 1990's  . Breast cyst excision Right 2011    There were no vitals filed for this visit.  Visit Diagnosis:  Lumbar pain with radiation down right leg  Joint stiffness of spine  Sensory disturbance      Subjective Assessment - 02/25/15 1021    Subjective MRI scheduled for tonight.  She does not want to do it because of the money, (no insurance) may try and complete PT before taking that approach.    Currently in Pain? Yes   Pain Score 4    Pain Location Back   Pain Orientation Right;Lower   Pain Radiating Towards Rt LE   Pain Onset More than a month ago   Pain  Frequency Intermittent             [facility] Adult PT Treatment/Exercise - 02/25/15 1027    Lumbar Exercises: Stretches   Pelvic Tilt 5 reps;10 seconds   Lumbar Exercises: Aerobic   Stationary Bike NuStep L 5 UE and LE, 5 min    Lumbar Exercises: Supine   Ab Set 10 reps   Clam 10 reps   Heel Slides 10 reps   Bent Knee Raise 10 reps   Lumbar Exercises: Prone   Other Prone Lumbar Exercises prone on elbows 30 sec x 3, prone pelvic press, engage abdominals   Other Prone Lumbar Exercises sidelying QL stretch for soft tissue work   Moist Heat Therapy   Number Minutes Moist Heat 15 Minutes   Moist Heat Location Lumbar Spine   Electrical Stimulation   Electrical Stimulation Location Rt Low Back   Electrical Stimulation Goals Pain  sidelying   Manual Therapy   Manual Therapy Soft tissue mobilization   Manual therapy comments QL    also worked L4-L5 paraspinals into glute med            PT Education - 02/25/15 1046    Education Details L stab, Prepilates HEP, neutral pelvis  Person(s) Educated Patient   Methods Explanation;Verbal cues;Tactile cues;Handout   Comprehension Verbalized understanding;Tactile cues required;Verbal cues required;Need further instruction          PT Short Term Goals - 02/25/15 1031    PT SHORT TERM GOAL #1   Title Pt will be I with initial HEP   Status Partially Met   PT SHORT TERM GOAL #2   Title Pt will be able to stand from a sitting position without difficulty most of the time.    Status On-going   PT SHORT TERM GOAL #3   Title Pt will report improvement in LE symptoms to minimal (pain, tingling)   Status On-going           PT Long Term Goals - 02/25/15 1040    PT LONG TERM GOAL #1   Title Pt will be I with advanced HEP   Status On-going   PT LONG TERM GOAL #2   Title Pt will be able to demo safe lifting techniques and understand posture, body mech    Status On-going   PT LONG TERM GOAL #3   Title Pt will be able to sit for  45 min and report min discomfort in low back   Status On-going   PT LONG TERM GOAL #4   Title Pt will be able to walk for fitness for 30 min x >/=3 days per week, no increase in leg pain   Status On-going   PT LONG TERM GOAL #5   Title Pt will be able to reach, squat and lift mod sized items without exacerbation of pain.    Status On-going               Plan - 02/25/15 1117    Clinical Impression Statement Patient making slow progress, seems to be responding to extension based ex and was given stability exercises.  She needs reinforcement on technique and breathing to facilitate stability. No further goals met, 2nd treatment.  F/U with her re: MRI    PT Next Visit Plan review stab ex and repeat STW, modailities   PT Home Exercise Plan prone on elbows, Tranverse Abd, and stand ext. added pre-pilates   Consulted and Agree with Plan of Care Patient        Problem List Patient Active Problem List   Diagnosis Date Noted  . Headache 01/25/2015  . H/O: hysterectomy 01/25/2015  . Orthostatic hypotension 11/18/2014  . Papulovesicular rash RLE 02/09/2014  . Carbon monoxide exposure 10/27/2013  . Dizziness, nonspecific 10/27/2013  . Fatigue 07/16/2013  . Sensorineural hearing loss of both ears 07/16/2013  . Cataract 06/11/2013  . Healthcare maintenance 06/11/2013  . Patellofemoral pain syndrome--R KNEE 03/17/2013  . Positive TB test   . Insomnia 06/05/2012  . Sinus headache 06/05/2012  . Back pain, acute on chronic 06/05/2012  . Nocturnal polyuria 06/05/2012  . COPD 03/17/2010  . ABDOMINAL PAIN 08/16/2008  . CARPAL TUNNEL SYNDROME, BILATERAL 03/29/2008  . CONSTIPATION 01/28/2008  . ASTHMA, PERSISTENT, MODERATE 01/31/2007  . DEPRESSION, MAJOR, RECURRENT 11/14/2006  . TOBACCO DEPENDENCE 11/14/2006  . GASTROESOPHAGEAL REFLUX, NO ESOPHAGITIS 11/14/2006  . Irritable bowel syndrome 11/14/2006    Aimee Davis 02/25/2015, 11:30 AM  [facility] [address] [place], [place], [address] Phone: [phone]   Fax:  [phone]

## 2015-03-01 ENCOUNTER — Encounter: Payer: Self-pay | Admitting: Internal Medicine

## 2015-03-01 ENCOUNTER — Telehealth: Payer: Self-pay | Admitting: *Deleted

## 2015-03-01 ENCOUNTER — Ambulatory Visit (HOSPITAL_COMMUNITY)
Admission: RE | Admit: 2015-03-01 | Discharge: 2015-03-01 | Disposition: A | Discharge status: Home / Self Care | Payer: No Typology Code available for payment source | Source: Ambulatory Visit | Attending: Internal Medicine | Admitting: Internal Medicine

## 2015-03-01 ENCOUNTER — Ambulatory Visit: Payer: No Typology Code available for payment source

## 2015-03-01 ENCOUNTER — Ambulatory Visit (INDEPENDENT_AMBULATORY_CARE_PROVIDER_SITE_OTHER): Payer: No Typology Code available for payment source | Admitting: Internal Medicine

## 2015-03-01 VITALS — BP 126/71 | HR 97 | Temp 98.1°F | Wt 161.3 lb

## 2015-03-01 DIAGNOSIS — D2272 Melanocytic nevi of left lower limb, including hip: Secondary | ICD-10-CM

## 2015-03-01 DIAGNOSIS — Z0000 Encounter for general adult medical examination without abnormal findings: Secondary | ICD-10-CM

## 2015-03-01 DIAGNOSIS — M549 Dorsalgia, unspecified: Secondary | ICD-10-CM

## 2015-03-01 DIAGNOSIS — R600 Localized edema: Secondary | ICD-10-CM

## 2015-03-01 DIAGNOSIS — D229 Melanocytic nevi, unspecified: Secondary | ICD-10-CM

## 2015-03-01 DIAGNOSIS — M2560 Stiffness of unspecified joint, not elsewhere classified: Secondary | ICD-10-CM

## 2015-03-01 DIAGNOSIS — M7989 Other specified soft tissue disorders: Secondary | ICD-10-CM

## 2015-03-01 DIAGNOSIS — K136 Irritative hyperplasia of oral mucosa: Secondary | ICD-10-CM | POA: Insufficient documentation

## 2015-03-01 DIAGNOSIS — M545 Low back pain, unspecified: Secondary | ICD-10-CM

## 2015-03-01 DIAGNOSIS — R1084 Generalized abdominal pain: Secondary | ICD-10-CM

## 2015-03-01 DIAGNOSIS — D2271 Melanocytic nevi of right lower limb, including hip: Secondary | ICD-10-CM

## 2015-03-01 DIAGNOSIS — K088 Other specified disorders of teeth and supporting structures: Secondary | ICD-10-CM

## 2015-03-01 DIAGNOSIS — G8929 Other chronic pain: Secondary | ICD-10-CM

## 2015-03-01 DIAGNOSIS — K589 Irritable bowel syndrome without diarrhea: Secondary | ICD-10-CM

## 2015-03-01 DIAGNOSIS — M79604 Pain in right leg: Secondary | ICD-10-CM

## 2015-03-01 HISTORY — DX: Melanocytic nevi, unspecified: D22.9

## 2015-03-01 MED ORDER — HYOSCYAMINE SULFATE 0.125 MG SL SUBL: 0.1250 mg | SUBLINGUAL_TABLET | Freq: Every day | SUBLINGUAL | Status: DC | PRN

## 2015-03-01 NOTE — Therapy (Signed)
[place], [place], [address] Phone: [phone]   Fax:  [phone]  Physical Therapy Treatment  Patient Details  Name: Aimee Davis MRN: [id number] Date of Birth: 05-17-1956 Referring Provider:  Wilber Oliphant, MD  Encounter Date: 03/01/2015      PT End of Session - 03/01/15 1226    Visit Number 4   Date for PT Re-Evaluation 04/05/15   PT Start Time 0762   PT Stop Time 1245   PT Time Calculation (min) 60 min   Activity Tolerance Patient tolerated treatment well   Behavior During Therapy [facility] for tasks assessed/performed      Past Medical History  Diagnosis Date  . Allergy     seasonal, PCN, antidepressants, bentyl  . Asthma   . Carpal tunnel syndrome on both sides     right worse than left 2008  . Hyperlipidemia     LDL 108 JULY 2013  . Positive TB test     From MArch 2009 note : Recent F/u at [facility]. CXRAy negative.  Positive TST in 2007 (25m).  Unable to tolerate INH due to hepatotoxicity  . COPD (chronic obstructive pulmonary disease)   . Anemia   . GERD (gastroesophageal reflux disease)   . Chronic lower back pain     Past Surgical History  Procedure Laterality Date  . Induced abortion  2005  . Vaginal hysterectomy  1983    partial; for abnormal bleeding  . Breast lumpectomy Right 1990's  . Breast cyst excision Right 2011    There were no vitals filed for this visit.  Visit Diagnosis:  Lumbar pain with radiation down right leg  Joint stiffness of spine      Subjective Assessment - 03/01/15 1153    Subjective She did not do MRI. Will do PT and see what happens before MRI. Pain much better in past week or more. . She reports more intermittnat pain prior to onset of sharper spasms.   Currently in Pain? Yes   Pain Score 5    Pain Location Back   Pain Orientation Right;Left   Pain Descriptors / Indicators Aching;Sharp;Spasm   Pain Type Chronic pain   Pain Radiating  Towards Rt knee pain   Pain Onset More than a month ago   Pain Frequency Intermittent   Aggravating Factors  bending mostly   Pain Relieving Factors cold and at rest.    Multiple Pain Sites No                         [facility] Adult PT Treatment/Exercise - 03/01/15 1153    Lumbar Exercises: Aerobic   Stationary Bike NuStep L 5 UE and LE, 5 min    Lumbar Exercises: Supine   Ab Set 10 reps  with abdomen set   Clam 10 reps  with abdomen set   Heel Slides 10 reps  with abdomen set    Bent Knee Raise 10 reps  with abdomen set   Lumbar Exercises: Prone   Other Prone Lumbar Exercises prone on elbows 30 sec x 3, prone pelvic press, engage abdominals with pubic bone press 5 sec x 10   Moist Heat Therapy   Number Minutes Moist Heat 20 Minutes   Moist Heat Location Lumbar Spine   Electrical Stimulation   Electrical Stimulation Location Rt Low Back  sidelye   Electrical Stimulation Action IFC   Electrical Stimulation Parameters L8   Electrical Stimulation  Goals Pain     All HEP reviewed from last session             PT Short Term Goals - 02/25/15 1031    PT SHORT TERM GOAL #1   Title Pt will be I with initial HEP   Status Partially Met   PT SHORT TERM GOAL #2   Title Pt will be able to stand from a sitting position without difficulty most of the time.    Status On-going   PT SHORT TERM GOAL #3   Title Pt will report improvement in LE symptoms to minimal (pain, tingling)   Status On-going           PT Long Term Goals - 02/25/15 1040    PT LONG TERM GOAL #1   Title Pt will be I with advanced HEP   Status On-going   PT LONG TERM GOAL #2   Title Pt will be able to demo safe lifting techniques and understand posture, body mech    Status On-going   PT LONG TERM GOAL #3   Title Pt will be able to sit for 45 min and report min discomfort in low back   Status On-going   PT LONG TERM GOAL #4   Title Pt will be able to walk for fitness for 30 min x >/=3  days per week, no increase in leg pain   Status On-going   PT LONG TERM GOAL #5   Title Pt will be able to reach, squat and lift mod sized items without exacerbation of pain.    Status On-going               Plan - 03/01/15 1227    Clinical Impression Statement indication of pain with walking and getting in/off mat. She needed cues to do exercises correctly but did better post instruction.    PT Next Visit Plan review stab ex and repeat STW if time, modailities   Consulted and Agree with Plan of Care Patient        Problem List Patient Active Problem List   Diagnosis Date Noted  . Irritation of oral cavity 03/01/2015  . Swelling of left lower extremity 03/01/2015  . Multiple nevi 03/01/2015  . Headache 01/25/2015  . H/O: hysterectomy 01/25/2015  . Orthostatic hypotension 11/18/2014  . Papulovesicular rash RLE 02/09/2014  . Carbon monoxide exposure 10/27/2013  . Dizziness, nonspecific 10/27/2013  . Fatigue 07/16/2013  . Sensorineural hearing loss of both ears 07/16/2013  . Cataract 06/11/2013  . Healthcare maintenance 06/11/2013  . Patellofemoral pain syndrome--R KNEE 03/17/2013  . Positive TB test   . Insomnia 06/05/2012  . Sinus headache 06/05/2012  . Back pain, acute on chronic 06/05/2012  . Nocturnal polyuria 06/05/2012  . COPD 03/17/2010  . Abdominal pain 08/16/2008  . CARPAL TUNNEL SYNDROME, BILATERAL 03/29/2008  . CONSTIPATION 01/28/2008  . ASTHMA, PERSISTENT, MODERATE 01/31/2007  . DEPRESSION, MAJOR, RECURRENT 11/14/2006  . TOBACCO DEPENDENCE 11/14/2006  . GASTROESOPHAGEAL REFLUX, NO ESOPHAGITIS 11/14/2006  . Irritable bowel syndrome 11/14/2006    Darrel Hoover PT 03/01/2015, 12:29 PM  [facility] [address] [place], [place], [address] Phone: [phone]   Fax:  [phone]

## 2015-03-01 NOTE — Assessment & Plan Note (Signed)
Will hold off on any vaccinations today as she appears to be recovering from possible allergic reaction with unclear etiology.   tdap on follow up visit zostavax on follow up visit if she can afford it

## 2015-03-01 NOTE — Telephone Encounter (Signed)
Call from [facility] stating pt was Negative for DVT in left leg.

## 2015-03-01 NOTE — Telephone Encounter (Signed)
Finally spoke w/ pt this am, she will be placed in 1045 slot today, states she needs the hycosamine but cannot go back to [facility] because she only has the orange card.

## 2015-03-01 NOTE — Progress Notes (Signed)
Preliminary results be tech - Left Lower Ext. Venous Duplex Completed. Negative for deep and superficial vein thrombosis in the left leg.  Oda Cogan, BS, RDMS, RVT

## 2015-03-01 NOTE — Assessment & Plan Note (Addendum)
Patient reports improvement of back pain with PT. Last back spasm was 2 weeks ago.   Continue PT Pt reports MRI was not done yet because she was told insurance would not cover it, will have clinic staff find out details and try to reschedule as ordered by Dr. Alice Rieger

## 2015-03-01 NOTE — Assessment & Plan Note (Signed)
Unclear etiology. Possible sequelae of allergic reaction she reports having 2-3 weeks ago that has now resolved. No visible ulcers or thrush. Denies change in medications or foods eaten. She did try a "wellness formula" she purchased yesterday that she says did not help. She takes loratadine 10mg  daily that is also not helping. No sob or feeling like throat is closing up at this time. No trouble swallowing but endorses some possible dysphagia to solid foods which is not new.   -trial of benadryl qhs x1 week, caution for sedation advised not to drive or operate heavy machinery while taking medication -salt water rinses up to 3-4 times a day; may consider magic mouthwash if no improvement and if patient can afford -avoid any new medications or change of foods, avoid taking extra vitamin supplements and just continue her multivitamin and fish oil for now that she has been taking for several months -follow up in 1 week or sooner if no improvement or worsening. Any sob or sensation of throat closing up advised to go directly to the emergency room

## 2015-03-01 NOTE — Progress Notes (Signed)
Subjective:   Patient ID: Aimee Davis female   DOB: [birth year] 59 y.o.   MRN: [id number]  HPI: [name] is a 59 y.o. female with chronic abdominal pain and other PMH as listed below presenting to opc today for acute visit with complaints of mouth irritation.   Reports ?allergic reaction 2-3 weeks ago with red, itching bumpy rash on b/l sides of neck extending to top of breast region that resolved with hydrocortisone cream. She does not know what caused it and says it has now improved. However, last week she noticed a slight throat and dry sensation in her mouth in the morning that has resolved but now has pain/irritation in her mouth. Eating seems to irritate the discomfort more. She denies eating anything different or change in her medications although did try a "wellness formula" yesterday to see if it would help but it did not. She denies any sob or feeling her throat is closing up but says chronically she feels like maybe her throat is not that open as it should be and was being worked up for possible dysphagia by GI. Denies any prior episode of similar rash or mouth irritation.   Chronic abdominal pain--continues to have intermittent abdominal spasms that leaves her stomach sore. She has been out of her hyoscyamine that she cannot get filled because she cannot afford to go back to [facility] due to cost. She was following at [facility] with GI with orange card, last seen 12/2014, thought to have abdominal wall pain which was noted to improve with injection therapy. She was noted during that visit to have new onset solid dysphagia and recommended for EGD which we have been trying to get scheduled for her here.  Last colonoscopy 11/2012 by Dr. Halford Chessman: noted to be normal with fair prep, repeat exam recommended in 5 years.  CT A/P 2011: IMPRESSION: Large amount of stool present within portions of the colon as described above. Otherwise unremarkable abdominal pelvic CT with no  explanation for the patient's persistent abdominal pain noted. U/S Abdomen 09/2011: negative  Noticed mole like spots on her b/l lower extremities that she does not know what they are but numerous, not bothersome, not changing, no itching or pain.   Past Medical History  Diagnosis Date  . Allergy     seasonal, PCN, antidepressants, bentyl  . Asthma   . Carpal tunnel syndrome on both sides     right worse than left 2008  . Hyperlipidemia     LDL 108 JULY 2013  . Positive TB test     From [facility] 2009 note : Recent F/u at [facility]. CXRAy negative.  Positive TST in 2007 (13mm).  Unable to tolerate INH due to hepatotoxicity  . COPD (chronic obstructive pulmonary disease)   . Anemia   . GERD (gastroesophageal reflux disease)   . Chronic lower back pain    Current Outpatient Prescriptions  Medication Sig Dispense Refill  . albuterol (PROVENTIL HFA;VENTOLIN HFA) 108 (90 BASE) MCG/ACT inhaler Inhale 2 puffs into the lungs every 6 (six) hours as needed for wheezing. 1 Inhaler 6  . Artificial Tear Ointment (DRY EYES OP) Apply 1 drop to eye daily as needed (dry eyes).    . Ascorbic Acid (VITAMIN C) 100 MG tablet Take 50 mg by mouth daily.    Marland Kitchen CALCIUM-VITAMIN D PO Take by mouth. Patient taking 2 in morning and 1 at night    . cyclobenzaprine (FLEXERIL) 5 MG tablet Take 1 tablet (5  mg total) by mouth every 8 (eight) hours as needed for muscle spasms. 30 tablet 1  . esomeprazole (NEXIUM) 40 MG capsule Take 1 capsule (40 mg total) by mouth daily before breakfast. 90 capsule 5  . fish oil-omega-3 fatty acids 1000 MG capsule Take 2 g by mouth daily.     . Flaxseed MISC Take by mouth See admin instructions. Teaspoonful of seed blended and mixed with yogurt    . Fluticasone-Salmeterol (ADVAIR DISKUS) 250-50 MCG/DOSE AEPB Inhale 1 puff into the lungs 2 (two) times daily. 60 each 4  . hyoscyamine (LEVSIN SL) 0.125 MG SL tablet Place 1 tablet (0.125 mg total) under the tongue every 4 (four) hours as needed for  cramping. 30 tablet 1  . loratadine (CLARITIN) 10 MG tablet Take 10 mg by mouth daily as needed for allergies.     . mometasone (NASONEX) 50 MCG/ACT nasal spray Place 2 sprays in each nostril once daily as needed 17 g 3  . Multiple Vitamin (MULTIVITAMIN) capsule Take 1 capsule by mouth daily.    . naproxen (NAPROSYN) 250 MG tablet Take 500 mg by mouth 2 (two) times daily with a meal.    . Probiotic Product (PROBIOTIC DAILY PO) Take by mouth.     No current facility-administered medications for this visit.   Family History  Problem Relation Age of Onset  . Hypertension Mother   . Hyperlipidemia Mother   . Heart disease Mother   . Diabetes Mother   . Kidney disease Mother   . Stroke Father   . Diabetes Father   . Heart disease Father   . Hyperlipidemia Father   . Hypertension Father   . Kidney disease Father   . Cancer Sister   . Mental illness Sister   . Diabetes Sister   . Kidney disease Sister   . Hypertension Sister   . Mental illness Brother   . Asthma Son   . Hypertension Maternal Aunt   . Hypertension Maternal Uncle   . Hypertension Paternal Aunt   . Kidney disease Paternal Aunt   . Hypertension Paternal Uncle   . Kidney disease Paternal Uncle    History   Social History  . Marital Status: Divorced    Spouse Name: N/A  . Number of Children: N/A  . Years of Education: N/A   Social History Main Topics  . Smoking status: Former Smoker -- 0.33 packs/day for 43 years    Types: Cigarettes    Quit date: 01/06/2013  . Smokeless tobacco: Never Used  . Alcohol Use: No  . Drug Use: No  . Sexual Activity: Not on file     Comment: S/P PARTIAL HYSTERECTOMY   Other Topics Concern  . Not on file   Social History Narrative   Review of Systems:  Constitutional:  Denies fever, chills  HEENT:  Mouth irritation  Respiratory:  Denies SOB  Cardiovascular:  Denies chest pain  Gastrointestinal:  Denies nausea, vomiting, diarrhea. +chronic abdominal pain/spasms    Genitourinary:  Denies dysuria  Musculoskeletal:  Improving back pain, occasional left leg cramping pain  Skin:  ?moles on lower extremities  Neurological:  Occasional radiation of back pain down right leg   Objective:  Physical Exam: Filed Vitals:   03/01/15 1022  BP: 126/71  Pulse: 97  Temp: 98.1 F (36.7 C)  TempSrc: Oral  Weight: 161 lb 4.8 oz (73.165 kg)  SpO2: 98%   Vitals reviewed. General: sitting in chair, NAD HEENT: -thrush. No visible oral ulcers or  pus in mouth. Missing teeth with some poor dentition. Patient reports discomfort below tongue on bottom of mouth on right side but no visible ulcer seen in that area but tender/sensitivity to palpation when patient presses in that region Cardiac: RRR Pulm: clear to auscultation bilaterally, no wheezes, rales, or rhonchi Abd: soft, diffuse "soreness" per patient to palpation, nondistended, BS present Ext: warm and well perfused, left leg appears greater in size than right but no pitting edema, no tenderness to palpation or erythema, +2dp b/l, large ecchymosis of RLE on medial portion, +scattered ?nevi on b/l lower extremities more on medial thigh region extending up to groin. The sports are mostly circular, flat, non-tender, non-pruritic, mostly skin color pigment with some noted to have slight rough texture to palpation on medial thigh. Patient says they are not new and not changing just numerous with new ones coming up occasionally. Examined with Dr. Lynnae January in clinic today Neuro: alert and oriented X3, strength and sensation equal lower extremities  Assessment & Plan:  Discussed with Dr. Lynnae January

## 2015-03-01 NOTE — Assessment & Plan Note (Addendum)
Scattered spots on lower extremities, mostly on upper thighs medial surface. Mostly skin pigment with some with rough texture on palpation possibly where she scratches her legs at times? She denies any pain, bleeding or itching at sites of spots. Says they are not new and not changing but just present with new ones appearing. Examined with Dr. Lynnae January in clinic today. Thought to be nevi.   -will monitor for now and follow up on next visit. Discussed possible punch biopsy, however patient agrees to just monitor for now and if any changes or becoming bothersome, change in size, shape, color, or texture advised to let us know right away and she then agrees to proceed with punch biopsy and further evaluation

## 2015-03-01 NOTE — Patient Instructions (Addendum)
Dear [name],  Thank you for coming in today.   Lets try salt walter rinses up to 3-4 times a day for the next week and benadryl at night (might make you sleepy). If you feel like the feeling in your mouth is getting any worse or not improving, call us back right away [phone] as you will then need to be seen sooner, otherwise follow up in 1 week.   We are trying to get the GI doctor to schedule your appointment, they will call you for an appointment  Please keep an eye on the spots on your legs, if you notice them changing size or color or getting painful or itchy or any other changes call us right away  Please get your lower extremity ultrasound done  Continue to follow up with PT

## 2015-03-01 NOTE — Assessment & Plan Note (Signed)
Increase in size compared to right leg. Reports occasional pain/crampingin left leg at night. Denies sob or chest pain. No tenderness to palpation on exam and no pitting edema or erythema.   -lower extremity venous duplex r/o DVT

## 2015-03-01 NOTE — Assessment & Plan Note (Signed)
Chronic abdominal pain. Was followed at [place] but cannot afford to return there anymore due to only having orange card. She was last seen there by Dr. Redmond Pulling 12/2014 and thought to have chronic abdominal wall pain which they reported improved with trigger point injections. She was also recommended for EGD for ?new onset solid dysphagia which has not been able to be scheduled there yet and nexium was increased to 40mg  bid, however patient is only taking it occasionally as she has read its multiple possible side effects.   -awaiting GI follow up appt in [place], RN followed up with office today who will contact patient and referral coordinators in clinic will continue to follow -refilled hyoscyamine until follow up appt -she will continue nexium and I explained to take it as prescribed by her provider to see if any assistance

## 2015-03-01 NOTE — Assessment & Plan Note (Signed)
GI appt with [place] pending, Ulis Rias called their office today and they will call the patient for an appointment and will also send a message to referral coordinators in [place] to follow Refilled hyoscyamine in the meantime, patient takes daily as needed only when she has the abdominal spasm which helps

## 2015-03-02 ENCOUNTER — Telehealth: Payer: Self-pay | Admitting: *Deleted

## 2015-03-02 NOTE — Telephone Encounter (Signed)
Contacted patient regarding MRI Lumbar Spine-pt wishes to hold off on MRI at this time.  She is currently getting physical therapy and wants to see if this helps first..Marland KitchenRegenia Skeeter, Amour Cutrone Cassady6/15/20169:24 AM

## 2015-03-04 ENCOUNTER — Ambulatory Visit: Payer: No Typology Code available for payment source | Admitting: Physical Therapy

## 2015-03-04 DIAGNOSIS — M545 Low back pain, unspecified: Secondary | ICD-10-CM

## 2015-03-04 DIAGNOSIS — R209 Unspecified disturbances of skin sensation: Secondary | ICD-10-CM

## 2015-03-04 DIAGNOSIS — M79604 Pain in right leg: Secondary | ICD-10-CM

## 2015-03-04 DIAGNOSIS — M2560 Stiffness of unspecified joint, not elsewhere classified: Secondary | ICD-10-CM

## 2015-03-04 NOTE — Progress Notes (Signed)
Internal Medicine Clinic Attending  Case discussed with Dr. Qureshi soon after the resident saw the patient.  We reviewed the resident's history and exam and pertinent patient test results.  I agree with the assessment, diagnosis, and plan of care documented in the resident's note. 

## 2015-03-04 NOTE — Therapy (Signed)
[place], [place], [address] Phone: [phone]   Fax:  [phone]  Physical Therapy Treatment  Patient Details  Name: Aimee Davis MRN: [id number] Date of Birth: [birth year] Referring Provider:  Wilber Oliphant, MD  Encounter Date: 03/04/2015      PT End of Session - 03/04/15 1139    Visit Number 5   Number of Visits 16   Date for PT Re-Evaluation 04/05/15   PT Start Time 1015   PT Stop Time 1100   PT Time Calculation (min) 45 min   Activity Tolerance Patient tolerated treatment well      Past Medical History  Diagnosis Date  . Allergy     seasonal, PCN, antidepressants, bentyl  . Asthma   . Carpal tunnel syndrome on both sides     right worse than left 2008  . Hyperlipidemia     LDL 108 JULY 2013  . Positive TB test     From [facility] 2009 note : Recent F/u at [facility]. CXRAy negative.  Positive TST in 2007 (31m).  Unable to tolerate INH due to hepatotoxicity  . COPD (chronic obstructive pulmonary disease)   . Anemia   . GERD (gastroesophageal reflux disease)   . Chronic lower back pain     Past Surgical History  Procedure Laterality Date  . Induced abortion  2005  . Vaginal hysterectomy  1983    partial; for abnormal bleeding  . Breast lumpectomy Right 1990's  . Breast cyst excision Right 2011    There were no vitals filed for this visit.  Visit Diagnosis:  Lumbar pain with radiation down right leg  Joint stiffness of spine  Sensory disturbance                       [facility] Adult PT Treatment/Exercise - 03/04/15 1137    Lumbar Exercises: Stretches   Active Hamstring Stretch 3 reps;30 seconds   Lumbar Exercises: Aerobic   Stationary Bike NuStep L 5 UE and LE, 5 min    Lumbar Exercises: Supine   Ab Set 10 reps  with abdomen set   Clam 10 reps  with abdomen set   Heel Slides 10 reps  with abdomen set    Bent Knee Raise 10 reps  with abdomen set   Lumbar  Exercises: Sidelying   Hip Abduction 20 reps   Manual Therapy   Manual Therapy Soft tissue mobilization   Manual therapy comments QL   Soft tissue mobilization lumbar paraspinals                PT Education - 03/04/15 1139    Education provided No          PT Short Term Goals - 02/25/15 1031    PT SHORT TERM GOAL #1   Title Pt will be I with initial HEP   Status Partially Met   PT SHORT TERM GOAL #2   Title Pt will be able to stand from a sitting position without difficulty most of the time.    Status On-going   PT SHORT TERM GOAL #3   Title Pt will report improvement in LE symptoms to minimal (pain, tingling)   Status On-going           PT Long Term Goals - 02/25/15 1040    PT LONG TERM GOAL #1   Title Pt will be I with advanced HEP   Status On-going   PT  LONG TERM GOAL #2   Title Pt will be able to demo safe lifting techniques and understand posture, body mech    Status On-going   PT LONG TERM GOAL #3   Title Pt will be able to sit for 45 min and report min discomfort in low back   Status On-going   PT LONG TERM GOAL #4   Title Pt will be able to walk for fitness for 30 min x >/=3 days per week, no increase in leg pain   Status On-going   PT LONG TERM GOAL #5   Title Pt will be able to reach, squat and lift mod sized items without exacerbation of pain.    Status On-going               Plan - 03/04/15 1139    Clinical Impression Statement Relief of sx after manual today, still needs min cues for HEP   PT Next Visit Plan review stab ex and repeat STW if time, modailities   PT Home Exercise Plan prone on elbows, Tranverse Abd, and stand ext. added pre-pilates   Consulted and Agree with Plan of Care Patient        Problem List Patient Active Problem List   Diagnosis Date Noted  . Irritation of oral cavity 03/01/2015  . Swelling of left lower extremity 03/01/2015  . Multiple nevi 03/01/2015  . Headache 01/25/2015  . H/O: hysterectomy  01/25/2015  . Orthostatic hypotension 11/18/2014  . Papulovesicular rash RLE 02/09/2014  . Carbon monoxide exposure 10/27/2013  . Dizziness, nonspecific 10/27/2013  . Fatigue 07/16/2013  . Sensorineural hearing loss of both ears 07/16/2013  . Cataract 06/11/2013  . Healthcare maintenance 06/11/2013  . Patellofemoral pain syndrome--R KNEE 03/17/2013  . Positive TB test   . Insomnia 06/05/2012  . Sinus headache 06/05/2012  . Back pain, acute on chronic 06/05/2012  . Nocturnal polyuria 06/05/2012  . COPD 03/17/2010  . Abdominal pain 08/16/2008  . CARPAL TUNNEL SYNDROME, BILATERAL 03/29/2008  . CONSTIPATION 01/28/2008  . ASTHMA, PERSISTENT, MODERATE 01/31/2007  . DEPRESSION, MAJOR, RECURRENT 11/14/2006  . TOBACCO DEPENDENCE 11/14/2006  . GASTROESOPHAGEAL REFLUX, NO ESOPHAGITIS 11/14/2006  . Irritable bowel syndrome 11/14/2006    PAA,JENNIFER 03/04/2015, 11:40 AM  [facility] [address] [place], [place], [address] Phone: [phone]   Fax:  [phone]    Raeford Razor, PT 03/04/2015 11:41 AM Phone: [phone] Fax: [phone]

## 2015-03-08 ENCOUNTER — Ambulatory Visit: Payer: No Typology Code available for payment source | Admitting: Physical Therapy

## 2015-03-08 DIAGNOSIS — M79604 Pain in right leg: Secondary | ICD-10-CM

## 2015-03-08 DIAGNOSIS — M545 Low back pain, unspecified: Secondary | ICD-10-CM

## 2015-03-08 DIAGNOSIS — M2560 Stiffness of unspecified joint, not elsewhere classified: Secondary | ICD-10-CM

## 2015-03-08 NOTE — Therapy (Signed)
[place], [place], [address] Phone: [phone]   Fax:  [phone]  Physical Therapy Treatment  Patient Details  Name: Aimee Davis MRN: [id number] Date of Birth: November 02, 1955 Referring Provider:  Wilber Oliphant, MD  Encounter Date: 03/08/2015      PT End of Session - 03/08/15 1657    Visit Number 6   Date for PT Re-Evaluation 04/05/15   PT Start Time 1016   PT Stop Time 1100   PT Time Calculation (min) 44 min   Activity Tolerance Patient tolerated treatment well   Behavior During Therapy [facility] for tasks assessed/performed      Past Medical History  Diagnosis Date  . Allergy     seasonal, PCN, antidepressants, bentyl  . Asthma   . Carpal tunnel syndrome on both sides     right worse than left 2008  . Hyperlipidemia     LDL 108 JULY 2013  . Positive TB test     From [facility] 2009 note : Recent F/u at [facility]. CXRAy negative.  Positive TST in 2007 (12m).  Unable to tolerate INH due to hepatotoxicity  . COPD (chronic obstructive pulmonary disease)   . Anemia   . GERD (gastroesophageal reflux disease)   . Chronic lower back pain     Past Surgical History  Procedure Laterality Date  . Induced abortion  2005  . Vaginal hysterectomy  1983    partial; for abnormal bleeding  . Breast lumpectomy Right 1990's  . Breast cyst excision Right 2011    There were no vitals filed for this visit.  Visit Diagnosis:  Lumbar pain with radiation down right leg  Joint stiffness of spine      Subjective Assessment - 03/08/15 1026    Subjective 2 times a week she has a bad morning waking up is spasm.  Sitting 30 to 15 minutes.   Currently in Pain? Yes   Pain Score 8    Pain Location Back   Pain Orientation --  center   Pain Radiating Towards RT knee, thigh   Aggravating Factors  reach overhead,  gardening   Pain Relieving Factors hat , lumbar rool   Multiple Pain Sites No                          [facility] Adult PT Treatment/Exercise - 03/08/15 1030    Posture/Postural Control   Posture Comments entire session was education on posture education  For ADL's she was very interested asked good questions.   Moist Heat Therapy   Number Minutes Moist Heat 40 Minutes   Moist Heat Location Lumbar Spine  heat during posture and Body mechanics training.    Pain with sit to stand improved with scoot hips to the side.  To decrease irritation to quadratus lumborum.            PT Education - 03/08/15 1656    Education provided Yes   Education Details ADL posture and body mechanics training.   Person(s) Educated Patient   Methods Explanation;Demonstration;Verbal cues;Handout   Comprehension Verbalized understanding;Need further instruction          PT Short Term Goals - 02/25/15 1031    PT SHORT TERM GOAL #1   Title Pt will be I with initial HEP   Status Partially Met   PT SHORT TERM GOAL #2   Title Pt will be able to stand from a sitting position without  difficulty most of the time.    Status On-going   PT SHORT TERM GOAL #3   Title Pt will report improvement in LE symptoms to minimal (pain, tingling)   Status On-going           PT Long Term Goals - 02/25/15 1040    PT LONG TERM GOAL #1   Title Pt will be I with advanced HEP   Status On-going   PT LONG TERM GOAL #2   Title Pt will be able to demo safe lifting techniques and understand posture, body mech    Status On-going   PT LONG TERM GOAL #3   Title Pt will be able to sit for 45 min and report min discomfort in low back   Status On-going   PT LONG TERM GOAL #4   Title Pt will be able to walk for fitness for 30 min x >/=3 days per week, no increase in leg pain   Status On-going   PT LONG TERM GOAL #5   Title Pt will be able to reach, squat and lift mod sized items without exacerbation of pain.    Status On-going               Plan - 03/08/15 1657    Clinical  Impression Statement Focus on education.  Progress toward posture goals.  She now needs to practice these techniques.   PT Next Visit Plan review stab ex and repeat STW if time, modailities, practice lifting if able    Needs work on quadratus lumborum, manual if able.    Problem List Patient Active Problem List   Diagnosis Date Noted  . Irritation of oral cavity 03/01/2015  . Swelling of left lower extremity 03/01/2015  . Multiple nevi 03/01/2015  . Headache 01/25/2015  . H/O: hysterectomy 01/25/2015  . Orthostatic hypotension 11/18/2014  . Papulovesicular rash RLE 02/09/2014  . Carbon monoxide exposure 10/27/2013  . Dizziness, nonspecific 10/27/2013  . Fatigue 07/16/2013  . Sensorineural hearing loss of both ears 07/16/2013  . Cataract 06/11/2013  . Healthcare maintenance 06/11/2013  . Patellofemoral pain syndrome--R KNEE 03/17/2013  . Positive TB test   . Insomnia 06/05/2012  . Sinus headache 06/05/2012  . Back pain, acute on chronic 06/05/2012  . Nocturnal polyuria 06/05/2012  . COPD 03/17/2010  . Abdominal pain 08/16/2008  . CARPAL TUNNEL SYNDROME, BILATERAL 03/29/2008  . CONSTIPATION 01/28/2008  . ASTHMA, PERSISTENT, MODERATE 01/31/2007  . DEPRESSION, MAJOR, RECURRENT 11/14/2006  . TOBACCO DEPENDENCE 11/14/2006  . GASTROESOPHAGEAL REFLUX, NO ESOPHAGITIS 11/14/2006  . Irritable bowel syndrome 11/14/2006    [facility] 03/08/2015, 5:01 PM  [facility] [address] [place], [place], [address] Phone: [phone]   Fax:  [phone]     Melvenia Needles, PTA 03/08/2015 5:01 PM Phone: [phone] Fax: [phone]

## 2015-03-08 NOTE — Patient Instructions (Signed)

## 2015-03-10 ENCOUNTER — Other Ambulatory Visit: Payer: Self-pay | Admitting: *Deleted

## 2015-03-10 MED ORDER — FLUTICASONE-SALMETEROL 250-50 MCG/DOSE IN AEPB: 1.0000 | INHALATION_SPRAY | Freq: Two times a day (BID) | RESPIRATORY_TRACT | Status: DC

## 2015-03-11 ENCOUNTER — Ambulatory Visit: Payer: No Typology Code available for payment source | Admitting: Physical Therapy

## 2015-03-11 DIAGNOSIS — M2560 Stiffness of unspecified joint, not elsewhere classified: Secondary | ICD-10-CM

## 2015-03-11 DIAGNOSIS — M545 Low back pain, unspecified: Secondary | ICD-10-CM

## 2015-03-11 DIAGNOSIS — M79604 Pain in right leg: Secondary | ICD-10-CM

## 2015-03-11 DIAGNOSIS — R209 Unspecified disturbances of skin sensation: Secondary | ICD-10-CM

## 2015-03-11 NOTE — Therapy (Signed)
[facility] [address] [place], [place], [address] Phone: [phone]   Fax:  [phone]  Physical Therapy Treatment  Patient Details  Name: Aimee Davis MRN: [id number] Date of Birth: 17-Mar-1956 Referring Provider:  Baltazar Apo, MD  Encounter Date: 03/11/2015      PT End of Session - 03/11/15 1101    Visit Number 7   Number of Visits 16   Date for PT Re-Evaluation 04/05/15   PT Start Time 1021   PT Stop Time 1115   PT Time Calculation (min) 54 min   Activity Tolerance Patient tolerated treatment well      Past Medical History  Diagnosis Date  . Allergy     seasonal, PCN, antidepressants, bentyl  . Asthma   . Carpal tunnel syndrome on both sides     right worse than left 2008  . Hyperlipidemia     LDL 108 JULY 2013  . Positive TB test     From MArch 2009 note : Recent F/u at [facility]. CXRAy negative.  Positive TST in 2007 (14mm).  Unable to tolerate INH due to hepatotoxicity  . COPD (chronic obstructive pulmonary disease)   . Anemia   . GERD (gastroesophageal reflux disease)   . Chronic lower back pain     Past Surgical History  Procedure Laterality Date  . Induced abortion  2005  . Vaginal hysterectomy  1983    partial; for abnormal bleeding  . Breast lumpectomy Right 1990's  . Breast cyst excision Right 2011    There were no vitals filed for this visit.  Visit Diagnosis:  Lumbar pain with radiation down right leg  Joint stiffness of spine  Sensory disturbance      Subjective Assessment - 03/11/15 1026    Subjective Slept at mom's house, pain increased today ,maybe due to mattress?    Currently in Pain? Yes   Pain Score 7    Pain Location Back   Pain Orientation Lower          [facility] Adult PT Treatment/Exercise - 03/11/15 1031    Transfers   Transfers Sit to Stand   Sit to Stand 6: Modified independent (Device/Increase time)   Transfer via Lift Equipment --  showed patient how to  turn LEs Rt/Lt. to decr pain    Lumbar Exercises: Stretches   Piriformis Stretch 3 reps;30 seconds   Piriformis Stretch Limitations used 3 diffent ways ( thread the needle, knee over knee with trunk rot and single knee to opp shoulder   Lumbar Exercises: Standing   Other Standing Lumbar Exercises hip abd x 10 slow, glute md hip hike x10 each    Lumbar Exercises: Quadruped   Other Quadruped Lumbar Exercises childs posex 3 and x 1 to each side    Traction   Type of Traction Lumbar   Min (lbs) 45   Max (lbs) 65   Hold Time 60   Rest Time 15   Time 15                PT Education - 03/11/15 1101    Education provided Yes   Education Details lifting, sit to std anfd traction   Person(s) Educated Patient   Methods Explanation;Demonstration   Comprehension Verbalized understanding;Returned demonstration          PT Short Term Goals - 03/11/15 1045    PT SHORT TERM GOAL #1   Title Pt will be I with initial HEP   Status  Achieved   PT SHORT TERM GOAL #2   Title Pt will be able to stand from a sitting position without difficulty most of the time.    Status Partially Met   PT SHORT TERM GOAL #3   Title Pt will report improvement in LE symptoms to minimal (pain, tingling)   Status Achieved           PT Long Term Goals - 03/11/15 1048    PT LONG TERM GOAL #1   Title Pt will be I with advanced HEP   Status On-going   PT LONG TERM GOAL #2   Title Pt will be able to demo safe lifting techniques and understand posture, body mech    Status On-going   PT LONG TERM GOAL #3   Title Pt will be able to sit for 45 min and report min discomfort in low back   Status On-going   PT LONG TERM GOAL #4   Title Pt will be able to walk for fitness for 30 min x >/=3 days per week, no increase in leg pain   Status On-going   PT LONG TERM GOAL #5   Title Pt will be able to reach, squat and lift mod sized items without exacerbation of pain.    Status On-going                Plan - 03/11/15 1205    Clinical Impression Statement Patient improving, able to sit to stand with no pain in back when legs are sideswept  L or R. Tried traction today.     PT Next Visit Plan assess traction, give piriformis stretch   PT Home Exercise Plan prone on elbows, Tranverse Abd, and stand ext. added pre-pilates   Consulted and Agree with Plan of Care Patient        Problem List Patient Active Problem List   Diagnosis Date Noted  . Irritation of oral cavity 03/01/2015  . Swelling of left lower extremity 03/01/2015  . Multiple nevi 03/01/2015  . Headache 01/25/2015  . H/O: hysterectomy 01/25/2015  . Orthostatic hypotension 11/18/2014  . Papulovesicular rash RLE 02/09/2014  . Carbon monoxide exposure 10/27/2013  . Dizziness, nonspecific 10/27/2013  . Fatigue 07/16/2013  . Sensorineural hearing loss of both ears 07/16/2013  . Cataract 06/11/2013  . Healthcare maintenance 06/11/2013  . Patellofemoral pain syndrome--R KNEE 03/17/2013  . Positive TB test   . Insomnia 06/05/2012  . Sinus headache 06/05/2012  . Back pain, acute on chronic 06/05/2012  . Nocturnal polyuria 06/05/2012  . COPD 03/17/2010  . Abdominal pain 08/16/2008  . CARPAL TUNNEL SYNDROME, BILATERAL 03/29/2008  . CONSTIPATION 01/28/2008  . ASTHMA, PERSISTENT, MODERATE 01/31/2007  . DEPRESSION, MAJOR, RECURRENT 11/14/2006  . TOBACCO DEPENDENCE 11/14/2006  . GASTROESOPHAGEAL REFLUX, NO ESOPHAGITIS 11/14/2006  . Irritable bowel syndrome 11/14/2006    Aimee Davis 03/11/2015, 12:08 PM  [facility] [address] [place], [place], [address] Phone: [phone]   Fax:  [phone] Aimee Davis, PT 03/11/2015 12:08 PM Phone: [phone] Fax: [phone]

## 2015-03-11 NOTE — Patient Instructions (Signed)
Sit to stand to decr pain and strain in back Verbal review of lifting concepts

## 2015-03-15 ENCOUNTER — Ambulatory Visit: Payer: No Typology Code available for payment source | Admitting: Physical Therapy

## 2015-03-15 DIAGNOSIS — R209 Unspecified disturbances of skin sensation: Secondary | ICD-10-CM

## 2015-03-15 DIAGNOSIS — M545 Low back pain, unspecified: Secondary | ICD-10-CM

## 2015-03-15 DIAGNOSIS — M2560 Stiffness of unspecified joint, not elsewhere classified: Secondary | ICD-10-CM

## 2015-03-15 DIAGNOSIS — M79604 Pain in right leg: Secondary | ICD-10-CM

## 2015-03-15 NOTE — Therapy (Signed)
[place], [place], [address] Phone: [phone]   Fax:  [phone]  Physical Therapy Treatment  Patient Details  Name: Aimee Davis MRN: [id number] Date of Birth: May 18, 1956 Referring Provider:  Wilber Oliphant, MD  Encounter Date: 03/15/2015      PT End of Session - 03/15/15 1356    Visit Number 8   Number of Visits 16      Past Medical History  Diagnosis Date  . Allergy     seasonal, PCN, antidepressants, bentyl  . Asthma   . Carpal tunnel syndrome on both sides     right worse than left 2008  . Hyperlipidemia     LDL 108 JULY 2013  . Positive TB test     From MArch 2009 note : Recent F/u at [facility]. CXRAy negative.  Positive TST in 2007 (64m).  Unable to tolerate INH due to hepatotoxicity  . COPD (chronic obstructive pulmonary disease)   . Anemia   . GERD (gastroesophageal reflux disease)   . Chronic lower back pain     Past Surgical History  Procedure Laterality Date  . Induced abortion  2005  . Vaginal hysterectomy  1983    partial; for abnormal bleeding  . Breast lumpectomy Right 1990's  . Breast cyst excision Right 2011    There were no vitals filed for this visit.  Visit Diagnosis:  Lumbar pain with radiation down right leg  Joint stiffness of spine  Sensory disturbance      Subjective Assessment - 03/15/15 1527    Subjective Yesterday 8/10, 5/10 now.  Liked the traction.  half of the way through traction it really started to pull hard.  I think my asthma kicked in with the belt squeeze.  I brought my inhaler to take before traction.                         [facility] Treatment/Exercise - 03/15/15 1045    Lumbar Exercises: Stretches   Prone on Elbows Stretch 2 reps;30 seconds  able to decrease gluteal pain   Piriformis Stretch 3 reps;30 seconds   Piriformis Stretch Limitations 2 positions used   Lumbar Exercises: Supine   Other Supine Lumbar  Exercises transversus abdominus with breathing, and with clamshell. after instruction, issued for home   Lumbar Exercises: Prone   Other Prone Lumbar Exercises prone on elbows 2 reps 30 seconds.     Traction   Type of Traction Lumbar  inhaler prior to traction (Asthma)   Min (lbs) 40   Max (lbs) 60   Hold Time 60   Rest Time 15   Time 15                PT Education - 03/15/15 1355    Education provided Yes   Education Details transversus abdominus issued from exercise drawer.   Person(s) Educated Patient   Methods Explanation;Tactile cues;Verbal cues;Handout   Comprehension Verbalized understanding;Returned demonstration;Need further instruction          PT Short Term Goals - 03/11/15 1045    PT SHORT TERM GOAL #1   Title Pt will be I with initial HEP   Status Achieved   PT SHORT TERM GOAL #2   Title Pt will be able to stand from a sitting position without difficulty most of the time.    Status Partially Met   PT SHORT TERM GOAL #3   Title Pt  will report improvement in LE symptoms to minimal (pain, tingling)   Status Achieved           PT Long Term Goals - 03/11/15 1048    PT LONG TERM GOAL #1   Title Pt will be I with advanced HEP   Status On-going   PT LONG TERM GOAL #2   Title Pt will be able to demo safe lifting techniques and understand posture, body mech    Status On-going   PT LONG TERM GOAL #3   Title Pt will be able to sit for 45 min and report min discomfort in low back   Status On-going   PT LONG TERM GOAL #4   Title Pt will be able to walk for fitness for 30 min x >/=3 days per week, no increase in leg pain   Status On-going   PT LONG TERM GOAL #5   Title Pt will be able to reach, squat and lift mod sized items without exacerbation of pain.    Status On-going               Problem List Patient Active Problem List   Diagnosis Date Noted  . Irritation of oral cavity 03/01/2015  . Swelling of left lower extremity 03/01/2015  .  Multiple nevi 03/01/2015  . Headache 01/25/2015  . H/O: hysterectomy 01/25/2015  . Orthostatic hypotension 11/18/2014  . Papulovesicular rash RLE 02/09/2014  . Carbon monoxide exposure 10/27/2013  . Dizziness, nonspecific 10/27/2013  . Fatigue 07/16/2013  . Sensorineural hearing loss of both ears 07/16/2013  . Cataract 06/11/2013  . Healthcare maintenance 06/11/2013  . Patellofemoral pain syndrome--R KNEE 03/17/2013  . Positive TB test   . Insomnia 06/05/2012  . Sinus headache 06/05/2012  . Back pain, acute on chronic 06/05/2012  . Nocturnal polyuria 06/05/2012  . COPD 03/17/2010  . Abdominal pain 08/16/2008  . CARPAL TUNNEL SYNDROME, BILATERAL 03/29/2008  . CONSTIPATION 01/28/2008  . ASTHMA, PERSISTENT, MODERATE 01/31/2007  . DEPRESSION, MAJOR, RECURRENT 11/14/2006  . TOBACCO DEPENDENCE 11/14/2006  . GASTROESOPHAGEAL REFLUX, NO ESOPHAGITIS 11/14/2006  . Irritable bowel syndrome 11/14/2006    [facility] 03/15/2015, 3:30 PM  [facility] [address] [place], [place], [address] Phone: [phone]   Fax:  [phone]     Melvenia Needles, PTA 03/15/2015 3:30 PM Phone: [phone] Fax: [phone]

## 2015-03-17 ENCOUNTER — Telehealth: Payer: Self-pay | Admitting: *Deleted

## 2015-03-17 NOTE — Telephone Encounter (Signed)
===  View-only below this line===  ----- Message -----    From: Inda Castle, MD    Sent: 03/11/2015   3:36 PM      To: Oda Kilts, CMA Subject: RE: Come back here??                           I still would refer her to a pain clinic ----- Message -----    From: Oda Kilts, CMA    Sent: 03/11/2015   2:18 PM      To: Inda Castle, MD Subject: RE: Come back here??                           Looks like in 2008 you seen her ----- Message -----    From: Inda Castle, MD    Sent: 03/10/2015  11:09 AM      To: Oda Kilts, CMA Subject: RE: Come back here??                           This pt was referred to the pain clinic for chronic abdominal pain.  I think that is a more appropriate referral.  Has she been seen here before? ----- Message -----    From: Oda Kilts, CMA    Sent: 03/09/2015   3:10 PM      To: Inda Castle, MD Subject: Come back here??                               Dr Deatra Ina this patient has been seeing Milderd Meager at [facility] but wants to come back here?? I cant print her notes but they are in Care Everywhere to view. Will you review and let me know Thanks

## 2015-03-30 ENCOUNTER — Ambulatory Visit: Payer: No Typology Code available for payment source | Attending: Internal Medicine | Admitting: Physical Therapy

## 2015-03-30 DIAGNOSIS — M2560 Stiffness of unspecified joint, not elsewhere classified: Secondary | ICD-10-CM | POA: Insufficient documentation

## 2015-03-30 DIAGNOSIS — R208 Other disturbances of skin sensation: Secondary | ICD-10-CM | POA: Insufficient documentation

## 2015-03-30 DIAGNOSIS — M545 Low back pain, unspecified: Secondary | ICD-10-CM

## 2015-03-30 DIAGNOSIS — M79604 Pain in right leg: Secondary | ICD-10-CM

## 2015-03-30 NOTE — Therapy (Signed)
[facility] Outpatient Rehabilitation [facility] [address] [place], [place], [address] Phone: [phone]   Fax:  [phone]  Physical Therapy Treatment  Patient Details  Name: Aimee Davis MRN: [id number] Date of Birth: [birth year] Referring Jashon Ishida:  Ruben Im, MD  Encounter Date: 03/30/2015      PT End of Session - 03/30/15 1721    Visit Number 9   Number of Visits 16   Date for PT Re-Evaluation 04/05/15   PT Start Time 1635   PT Stop Time 1735   PT Time Calculation (min) 60 min      Past Medical History  Diagnosis Date  . Allergy     seasonal, PCN, antidepressants, bentyl  . Asthma   . Carpal tunnel syndrome on both sides     right worse than left 2008  . Hyperlipidemia     LDL 108 JULY 2013  . Positive TB test     From MArch 2009 note : Recent F/u at [facility]. CXRAy negative.  Positive TST in 2007 (14mm).  Unable to tolerate INH due to hepatotoxicity  . COPD (chronic obstructive pulmonary disease)   . Anemia   . GERD (gastroesophageal reflux disease)   . Chronic lower back pain     Past Surgical History  Procedure Laterality Date  . Induced abortion  2005  . Vaginal hysterectomy  1983    partial; for abnormal bleeding  . Breast lumpectomy Right 1990's  . Breast cyst excision Right 2011    There were no vitals filed for this visit.  Visit Diagnosis:  Lumbar pain with radiation down right leg  Joint stiffness of spine      Subjective Assessment - 03/30/15 1641    Subjective 7/10. Throbbing. aching.  Has to pack up whole apartment.  Standing 15 minutes is all she can stand.   Currently in Pain? Yes   Pain Score 7    Pain Location Back   Pain Orientation Right;Lower   Pain Descriptors / Indicators Aching;Throbbing   Pain Radiating Towards Gluteal   Aggravating Factors  pulling heavy items, standing bending , getting up from sitting.   Pain Relieving Factors traction. Extension exercises,  pilllow under stomach while prone.                          [facility] Adult PT Treatment/Exercise - 03/30/15 1651    Lumbar Exercises: Stretches   Standing Extension 3 reps   Prone on Elbows Stretch --  flat with 1 pillow under abdomin.     Lumbar Exercises: Supine   Other Supine Lumbar Exercises transversus abdominus 3 reps,  stopped due to position painful today.   Lumbar Exercises: Prone   Single Arm Raise 5 reps   Single Arm Raises Limitations Rt OK LT 6/10 pain then stopped.   Other Prone Lumbar Exercises multifitus press 10 reps 5 seconds  tries with moving knee into flexion 5-6/10 pain.   Traction   Type of Traction Lumbar   Min (lbs) 35   Max (lbs) 65  Patient's request   Hold Time 60   Rest Time 15   Time 15  tried table unlocked today     Plan:  No pain post session.   STG# 3 met.  LTG# 2 progress toward.  LTG# 4 partially met.           PT Education - 03/30/15 1719    Education Details ADL body mechanics.  Person(s) Educated Patient   Methods Explanation;Demonstration;Verbal cues;Handout   Comprehension Verbalized understanding          PT Short Term Goals - 03/30/15 1721    PT SHORT TERM GOAL #1   Title Pt will be I with initial HEP   Time 4   Period Weeks   Status Achieved   PT SHORT TERM GOAL #2   Title Pt will be able to stand from a sitting position without difficulty most of the time.    Baseline forgets to scoot hips to side with standing from sitting X1 today during session.  Non verbal pain evident.   Time 4   Period Weeks   Status On-going   PT SHORT TERM GOAL #3   Title Pt will report improvement in LE symptoms to minimal (pain, tingling)   Status Achieved           PT Long Term Goals - 03/30/15 1723    PT LONG TERM GOAL #1   Title Pt will be I with advanced HEP   Time 8   Period Weeks   Status On-going   PT LONG TERM GOAL #2   Title Pt will be able to demo safe lifting techniques and understand posture, body mech    Baseline initial education  for Lifting, ADL's today.  Too painful to practice lifting techniques today.   Time 8   Period Weeks   Status On-going   PT LONG TERM GOAL #3   Title Pt will be able to sit for 45 min and report min discomfort in low back   Baseline Patient can sit all she wants.  Getting up from sitting is painful.   Time 8   Period Weeks   Status Partially Met   PT LONG TERM GOAL #4   Title Pt will be able to walk for fitness for 30 min x >/=3 days per week, no increase in leg pain   Baseline walks 30 - 45 minutes  2X a week for exercise.  Pain sometimes in gluteal.   Time 8   Period Weeks   Status Partially Met   PT LONG TERM GOAL #5   Title Pt will be able to reach, squat and lift mod sized items without exacerbation of pain.    Baseline not yet doing correctly at home.  Has been shown how to do, will try good techniques soon as she is packing.   Time 8   Period Weeks   Status On-going               Plan - 03/30/15 1731    Clinical Impression Statement Patient    Next session continue traction, practice lifting, If not too sore  Stabilization.    Problem List Patient Active Problem List   Diagnosis Date Noted  . Irritation of oral cavity 03/01/2015  . Swelling of left lower extremity 03/01/2015  . Multiple nevi 03/01/2015  . Headache 01/25/2015  . H/O: hysterectomy 01/25/2015  . Orthostatic hypotension 11/18/2014  . Papulovesicular rash RLE 02/09/2014  . Carbon monoxide exposure 10/27/2013  . Dizziness, nonspecific 10/27/2013  . Fatigue 07/16/2013  . Sensorineural hearing loss of both ears 07/16/2013  . Cataract 06/11/2013  . Healthcare maintenance 06/11/2013  . Patellofemoral pain syndrome--R KNEE 03/17/2013  . Positive TB test   . Insomnia 06/05/2012  . Sinus headache 06/05/2012  . Back pain, acute on chronic 06/05/2012  . Nocturnal polyuria 06/05/2012  . COPD 03/17/2010  . Abdominal pain 08/16/2008  .  CARPAL TUNNEL SYNDROME, BILATERAL 03/29/2008  . CONSTIPATION  01/28/2008  . ASTHMA, PERSISTENT, MODERATE 01/31/2007  . DEPRESSION, MAJOR, RECURRENT 11/14/2006  . TOBACCO DEPENDENCE 11/14/2006  . GASTROESOPHAGEAL REFLUX, NO ESOPHAGITIS 11/14/2006  . Irritable bowel syndrome 11/14/2006    [facility] 03/30/2015, 5:42 PM  [facility] [address] [place], [place], [address] Phone: [phone]   Fax:  [phone]     Liz Beach, PTA 03/30/2015 5:42 PM Phone: [phone] Fax: [phone]

## 2015-03-30 NOTE — Patient Instructions (Signed)

## 2015-04-05 ENCOUNTER — Ambulatory Visit: Payer: No Typology Code available for payment source | Admitting: Physical Therapy

## 2015-04-05 DIAGNOSIS — M545 Low back pain, unspecified: Secondary | ICD-10-CM

## 2015-04-05 DIAGNOSIS — M2560 Stiffness of unspecified joint, not elsewhere classified: Secondary | ICD-10-CM

## 2015-04-05 DIAGNOSIS — R209 Unspecified disturbances of skin sensation: Secondary | ICD-10-CM

## 2015-04-05 DIAGNOSIS — M79604 Pain in right leg: Secondary | ICD-10-CM

## 2015-04-05 NOTE — Therapy (Signed)
[facility] [address] [place], [place], [address] Phone: [phone]   Fax:  [phone]  Physical Therapy Treatment/Renewal  Patient Details  Name: Aimee Davis MRN: [id number] Date of Birth: 08/06/1956 Referring Provider:  Ruben Im, MD  Encounter Date: 04/05/2015      PT End of Session - 04/05/15 1547    Visit Number 10   Number of Visits 22   Date for PT Re-Evaluation 05/17/15   PT Start Time 1505   PT Stop Time 1556   PT Time Calculation (min) 51 min   Activity Tolerance Patient tolerated treatment well   Behavior During Therapy [facility] for tasks assessed/performed      Past Medical History  Diagnosis Date  . Allergy     seasonal, PCN, antidepressants, bentyl  . Asthma   . Carpal tunnel syndrome on both sides     right worse than left 2008  . Hyperlipidemia     LDL 108 JULY 2013  . Positive TB test     From MArch 2009 note : Recent F/u at [facility]. CXRAy negative.  Positive TST in 2007 (14mm).  Unable to tolerate INH due to hepatotoxicity  . COPD (chronic obstructive pulmonary disease)   . Anemia   . GERD (gastroesophageal reflux disease)   . Chronic lower back pain     Past Surgical History  Procedure Laterality Date  . Induced abortion  2005  . Vaginal hysterectomy  1983    partial; for abnormal bleeding  . Breast lumpectomy Right 1990's  . Breast cyst excision Right 2011    There were no vitals filed for this visit.  Visit Diagnosis:  Lumbar pain with radiation down right leg  Joint stiffness of spine  Sensory disturbance      Subjective Assessment - 04/05/15 1515    Subjective Patient continues to pack her apt and load boxes. Pain is better. Had severe pain after traction last time. Does not want that today.    Currently in Pain? Yes   Pain Score 4    Pain Location Back   Pain Orientation Right;Left;Lower   Pain Descriptors / Indicators Sore   Pain Type Chronic pain   Pain Onset More  than a month ago   Pain Frequency Intermittent   Aggravating Factors  pulling, pushing, standing, bending   Pain Relieving Factors extension, stretches            [facility] PT Assessment - 04/05/15 1527    AROM   Lumbar Flexion 90   Lumbar - Right Side Bend 25   Lumbar - Left Side Bend 10   Lumbar - Right Rotation pain on Rt, to Rt   Lumbar - Left Rotation WNL    Strength   Right Hip Flexion 4+/5   Right Hip Extension 3/5   Right Hip ABduction 3/5   Left Hip Flexion 5/5   Left Hip Extension 4/5   Left Hip ABduction 5/5   Right Knee Flexion 3/5   Right Ankle Dorsiflexion 4/5            PT Short Term Goals - 04/05/15 1540    PT SHORT TERM GOAL #1   Title Pt will be I with initial HEP   Status Achieved   PT SHORT TERM GOAL #2   Title Pt will be able to stand from a sitting position without difficulty most of the time.    Status Partially Met   PT SHORT TERM GOAL #3  Title Pt will report improvement in LE symptoms to minimal (pain, tingling)   Status Achieved           PT Long Term Goals - 04/05/15 1541    PT LONG TERM GOAL #1   Title Pt will be I with advanced HEP   Status On-going   PT LONG TERM GOAL #2   Title Pt will be able to demo safe lifting techniques and understand posture, body mech    Status Partially Met   PT LONG TERM GOAL #3   Title Pt will be able to sit for 45 min and report min discomfort in low back   Status Partially Met   PT LONG TERM GOAL #4   Title Pt will be able to walk for fitness for 30 min x >/=3 days per week, no increase in leg pain   Status Partially Met   PT LONG TERM GOAL #5   Title Pt will be able to reach, squat and lift mod sized items without exacerbation of pain.    Status On-going   PT LONG TERM GOAL #6   Title Patient will be able to stand 90 min with pain no more than 3/10   Time 8   Status New               Plan - 04/05/15 1600    Clinical Impression Statement Patient reports drastic improvement in  symptoms since eval.  She has partially met several goals, but cont to struggle with consistency in using proper body mechanics, tolerance for standing and transitioning from sit to stand.  She will benefit from skillled PT to cont to reduce pain and discomfort with activities.  Sees MD 7/28 to renew [facility] coverage.     Pt will benefit from skilled therapeutic intervention in order to improve on the following deficits Decreased range of motion;Difficulty walking;Increased fascial restricitons;Impaired UE functional use;Increased muscle spasms;Decreased endurance;Decreased activity tolerance;Pain;Improper body mechanics;Impaired flexibility;Decreased strength;Decreased mobility;Impaired sensation   Rehab Potential Good   PT Frequency 2x / week   PT Duration 6 weeks   PT Treatment/Interventions ADLs/Self Care Home Management;Functional mobility training;Patient/family education;Passive range of motion;Dry needling;Therapeutic activities;Moist Heat;Traction;Ultrasound;Cryotherapy;Electrical Stimulation;Stair training;Gait training;Taping;Balance training;Manual techniques;Neuromuscular re-education;Therapeutic exercise   PT Next Visit Plan progress core (try Reformer) and reinforce principles with ADLs   Consulted and Agree with Plan of Care Patient        Problem List Patient Active Problem List   Diagnosis Date Noted  . Irritation of oral cavity 03/01/2015  . Swelling of left lower extremity 03/01/2015  . Multiple nevi 03/01/2015  . Headache 01/25/2015  . H/O: hysterectomy 01/25/2015  . Orthostatic hypotension 11/18/2014  . Papulovesicular rash RLE 02/09/2014  . Carbon monoxide exposure 10/27/2013  . Dizziness, nonspecific 10/27/2013  . Fatigue 07/16/2013  . Sensorineural hearing loss of both ears 07/16/2013  . Cataract 06/11/2013  . Healthcare maintenance 06/11/2013  . Patellofemoral pain syndrome--R KNEE 03/17/2013  . Positive TB test   . Insomnia 06/05/2012  . Sinus headache  06/05/2012  . Back pain, acute on chronic 06/05/2012  . Nocturnal polyuria 06/05/2012  . COPD 03/17/2010  . Abdominal pain 08/16/2008  . CARPAL TUNNEL SYNDROME, BILATERAL 03/29/2008  . CONSTIPATION 01/28/2008  . ASTHMA, PERSISTENT, MODERATE 01/31/2007  . DEPRESSION, MAJOR, RECURRENT 11/14/2006  . TOBACCO DEPENDENCE 11/14/2006  . GASTROESOPHAGEAL REFLUX, NO ESOPHAGITIS 11/14/2006  . Irritable bowel syndrome 11/14/2006    Darrek Leasure 04/05/2015, 4:10 PM  [facility] [address]  [address] [place], [place], [address] Phone: [phone]   Fax:  [phone]    [name], PT 04/05/2015 4:10 PM Phone: [phone] Fax: [phone]

## 2015-04-08 ENCOUNTER — Ambulatory Visit: Payer: No Typology Code available for payment source | Admitting: Physical Therapy

## 2015-04-12 ENCOUNTER — Ambulatory Visit: Payer: No Typology Code available for payment source

## 2015-04-12 ENCOUNTER — Other Ambulatory Visit: Payer: Self-pay | Admitting: *Deleted

## 2015-04-13 ENCOUNTER — Telehealth: Payer: Self-pay | Admitting: Internal Medicine

## 2015-04-13 NOTE — Telephone Encounter (Signed)
Call to patient to confirm appointment for 04/14/15 at 2:30 lmtcb

## 2015-04-14 ENCOUNTER — Ambulatory Visit: Payer: No Typology Code available for payment source

## 2015-04-14 ENCOUNTER — Ambulatory Visit (INDEPENDENT_AMBULATORY_CARE_PROVIDER_SITE_OTHER): Payer: No Typology Code available for payment source | Admitting: Internal Medicine

## 2015-04-14 ENCOUNTER — Encounter: Payer: Self-pay | Admitting: Internal Medicine

## 2015-04-14 VITALS — BP 123/65 | HR 60 | Temp 98.1°F | Ht 63.5 in | Wt 161.7 lb

## 2015-04-14 DIAGNOSIS — G8929 Other chronic pain: Secondary | ICD-10-CM

## 2015-04-14 DIAGNOSIS — Z0000 Encounter for general adult medical examination without abnormal findings: Secondary | ICD-10-CM

## 2015-04-14 DIAGNOSIS — K589 Irritable bowel syndrome without diarrhea: Secondary | ICD-10-CM

## 2015-04-14 DIAGNOSIS — Z23 Encounter for immunization: Secondary | ICD-10-CM

## 2015-04-14 DIAGNOSIS — M79604 Pain in right leg: Secondary | ICD-10-CM

## 2015-04-14 DIAGNOSIS — J4530 Mild persistent asthma, uncomplicated: Secondary | ICD-10-CM

## 2015-04-14 DIAGNOSIS — Z7951 Long term (current) use of inhaled steroids: Secondary | ICD-10-CM

## 2015-04-14 DIAGNOSIS — M545 Low back pain: Secondary | ICD-10-CM

## 2015-04-14 DIAGNOSIS — M549 Dorsalgia, unspecified: Principal | ICD-10-CM

## 2015-04-14 DIAGNOSIS — J45909 Unspecified asthma, uncomplicated: Secondary | ICD-10-CM

## 2015-04-14 LAB — LIPID PANEL
Cholesterol: 209 mg/dL — ABNORMAL HIGH (ref 125–200)
HDL: 94 mg/dL (ref >=46)
LDL CALC: 63 mg/dL (ref <130)
Total CHOL/HDL Ratio: 2.2 Ratio (ref <=5.0)
Triglycerides: 261 mg/dL — ABNORMAL HIGH (ref <150)
VLDL: 52 mg/dL — ABNORMAL HIGH (ref <30)

## 2015-04-14 MED ORDER — FLUTICASONE-SALMETEROL 250-50 MCG/DOSE IN AEPB: 1.0000 | INHALATION_SPRAY | Freq: Two times a day (BID) | RESPIRATORY_TRACT | Status: DC

## 2015-04-14 MED ORDER — HYOSCYAMINE SULFATE 0.125 MG SL SUBL: 0.1250 mg | SUBLINGUAL_TABLET | Freq: Every day | SUBLINGUAL | Status: DC | PRN

## 2015-04-14 NOTE — Progress Notes (Signed)
   Subjective:    Patient ID: Aimee Davis, female    DOB: 1956-04-04, 59 y.o.   MRN: [id number]  HPI  For history and details, see problem-based HPI in assessment and plan.   Review of Systems  Respiratory: Positive for chest tightness and shortness of breath. Negative for cough and wheezing.   Cardiovascular: Negative for chest pain and leg swelling.  Gastrointestinal: Positive for abdominal pain and abdominal distention. Negative for vomiting and blood in stool.  Musculoskeletal: Positive for back pain.       Objective:   Physical Exam  Cardiovascular: Normal rate, regular rhythm and normal heart sounds.   Pulmonary/Chest: Effort normal and breath sounds normal. No respiratory distress. She has no wheezes.  Abdominal: Soft. Bowel sounds are normal. She exhibits no mass. There is tenderness. There is no rebound and no guarding.  Negative Murphy's sign  Musculoskeletal:       Lumbar back: She exhibits tenderness and pain.       Right upper leg: She exhibits tenderness.       Legs: Palpation to tenderness in right lower back extending to posterior right leg. Straight leg raise positive on right, negative on left. There is no tenderness to palpation of the back at the midline  Neurological: She has normal strength.  5/5 strength in LE          Assessment & Plan:  For history and details, see problem-based HPI in assessment and plan.

## 2015-04-14 NOTE — Assessment & Plan Note (Addendum)
Ms. Dubas reports that she has more episodes of shortness of breath than usual ever since her apartment has requested that she move personal items during renovations. This occurs approximately 2-3 times a day. She denies any wheezing or needing to use her rescue inhaler more frequently. At baseline she uses her albuterol a couple times a week. - Congratulated her on continuing to be tobacco free - Will see if shortness of breath symptoms improve at next visit in 2 months - Refill Advair - Continue Albuterol

## 2015-04-14 NOTE — Progress Notes (Signed)
Internal Medicine Clinic Attending  I saw and evaluated the patient.  I personally confirmed the key portions of the history and exam documented by Dr. Ford and I reviewed pertinent patient test results.  The assessment, diagnosis, and plan were formulated together and I agree with the documentation in the resident's note. 

## 2015-04-14 NOTE — Telephone Encounter (Signed)
I am seeing her in clinic today and will make reorder decision then. Thank you!

## 2015-04-14 NOTE — Assessment & Plan Note (Signed)
Aimee Davis endorses longstanding cramping-like pain and constipation. She describes it as a diffuse pain and cannot localize it. She reports that it is worse in the morning and is somewhat relieved with hyoscyamine. On exam, there is diffuse tenderness and negative Murphy's sign. I confirm that this appears to be constipation-type IBS. - Refill hyoscyamine.

## 2015-04-14 NOTE — Assessment & Plan Note (Addendum)
Aimee Davis reports that her lower back pain is acutely worsening. She noticed it about a week ago when her apartment informed her that renovations were underway and that she needed to move her things. This lifting has exacerbated this longstanding pain; however, the back spasms which have previously been a problem for her have not returned. She describes the pain as an ache that starts in her right lower back and extends down her right leg. She says her symptoms are worse after sitting for extended periods of time. She takes Ibuprofen for it, two 200 mg pills once a day, which is moderately helpful. She has been seen by physical therapy, but the exercises has not helped. Looking back at previous imaging, she has had lumbar Xrays that show spondylolisthesis and facet arthritis - but there is no lumbar MRI on file. On exam, she has difficulty standing from the seated position, tenderness on palpation of right lower back and posterior leg, and a positive straight leg raise on the right but not the left. This differential includes sciatica or musculoskeletal strain.  - Informed patient that she can take up to 3200 mg of ibuprofen a day. To start, I recommended three 200 mg pills of ibuprofen three times daily. Reviewed previous Cr values, and there is no evidence of renal dysfunction - Urged her to take nexium daily , which she currently takes irregularly, for gastric protection - Recommended she continue PT, and I gave her exercises to perform at home - Recommended water aerobics at the community center, where she has access to a pool - Recommended she minimize heavy lifting and ask for help from friends and family - If her symptoms have not improved at her 2 month followup visit, will consider additional repeat lumbar Xrays or MRI

## 2015-04-14 NOTE — Patient Instructions (Signed)
Great to meet you today! For your lower back pain, you can take up to 3 pills of Ibuprofen (200 mg) 3 times a day. Perhaps 3 in the morning, 3 at lunch, and 3 before bed. Be sure to stay on your Nexium during this time to protect your stomach. See you if you can sign up for water aerobics courses.   Back Exercises Back exercises help treat and prevent back injuries. The goal of back exercises is to increase the strength of your abdominal and back muscles and the flexibility of your back. These exercises should be started when you no longer have back pain. Back exercises include:  Pelvic Tilt. Lie on your back with your knees bent. Tilt your pelvis until the lower part of your back is against the floor. Hold this position 5 to 10 sec and repeat 5 to 10 times.  Knee to Chest. Pull first 1 knee up against your chest and hold for 20 to 30 seconds, repeat this with the other knee, and then both knees. This may be done with the other leg straight or bent, whichever feels better.  Sit-Ups or Curl-Ups. Bend your knees 90 degrees. Start with tilting your pelvis, and do a partial, slow sit-up, lifting your trunk only 30 to 45 degrees off the floor. Take at least 2 to 3 seconds for each sit-up. Do not do sit-ups with your knees out straight. If partial sit-ups are difficult, simply do the above but with only tightening your abdominal muscles and holding it as directed.  Hip-Lift. Lie on your back with your knees flexed 90 degrees. Push down with your feet and shoulders as you raise your hips a couple inches off the floor; hold for 10 seconds, repeat 5 to 10 times.  Back arches. Lie on your stomach, propping yourself up on bent elbows. Slowly press on your hands, causing an arch in your low back. Repeat 3 to 5 times. Any initial stiffness and discomfort should lessen with repetition over time.  Shoulder-Lifts. Lie face down with arms beside your body. Keep hips and torso pressed to floor as you slowly lift your  head and shoulders off the floor. Do not overdo your exercises, especially in the beginning. Exercises may cause you some mild back discomfort which lasts for a few minutes; however, if the pain is more severe, or lasts for more than 15 minutes, do not continue exercises until you see your caregiver. Improvement with exercise therapy for back problems is slow.  See your caregivers for assistance with developing a proper back exercise program. Document Released: 10/11/2004 Document Revised: 11/26/2011 Document Reviewed: 07/05/2011 [facility] Patient Information 2015 [place], [place]. This information is not intended to replace advice given to you by your health care provider. Make sure you discuss any questions you have with your health care provider.

## 2015-04-14 NOTE — Assessment & Plan Note (Signed)
-   Received Tdap and Lipid panel today.

## 2015-04-22 ENCOUNTER — Ambulatory Visit: Payer: No Typology Code available for payment source | Admitting: Physical Therapy

## 2015-04-26 ENCOUNTER — Ambulatory Visit: Payer: No Typology Code available for payment source | Attending: Internal Medicine | Admitting: Physical Therapy

## 2015-04-26 DIAGNOSIS — M545 Low back pain, unspecified: Secondary | ICD-10-CM

## 2015-04-26 DIAGNOSIS — M79604 Pain in right leg: Secondary | ICD-10-CM

## 2015-04-26 DIAGNOSIS — M2560 Stiffness of unspecified joint, not elsewhere classified: Secondary | ICD-10-CM | POA: Insufficient documentation

## 2015-04-26 DIAGNOSIS — R208 Other disturbances of skin sensation: Secondary | ICD-10-CM | POA: Insufficient documentation

## 2015-04-26 NOTE — Patient Instructions (Signed)
Over Head Pull: Narrow Grip       On back, knees bent, feet flat, band across thighs, elbows straight but relaxed. Pull hands apart (start). Keeping elbows straight, bring arms up and over head, hands toward floor. Keep pull steady on band. Hold momentarily. Return slowly, keeping pull steady, back to start. Repeat _10-30__ times. Band color _yellow_____   Side Pull: Double Arm   On back, knees bent, feet flat. Arms perpendicular to body, shoulder level, elbows straight but relaxed. Pull arms out to sides, elbows straight. Resistance band comes across collarbones, hands toward floor. Hold momentarily. Slowly return to starting position. Repeat 10-30___ times. Band color _yellow____   Sash   On back, knees bent, feet flat, left hand on left hip, right hand above left. Pull right arm DIAGONALLY (hip to shoulder) across chest. Bring right arm along head toward floor. Hold momentarly 10-30__ times. Do with left arm. Band color _yellow_____   Shoulder Rotation: Double Arm   On back, knees bent, feet flat, elbows tucked at sides, bent 90, hands palms up. Pull hands apart and down toward floor, keeping elbows near sides. Hold momentarily. Slowly return to starting position. Repeat _10-30__ times. Band color _yellow_____

## 2015-04-26 NOTE — Therapy (Signed)
[place], [place], [address] Phone: [phone]   Fax:  [phone]  Physical Therapy Treatment  Patient Details  Name: Aimee Davis MRN: [id number] Date of Birth: [birth year] Referring Provider:  Liberty Handy, MD  Encounter Date: 04/26/2015      PT End of Session - 04/26/15 1812    Visit Number 11   Number of Visits 22   Date for PT Re-Evaluation 05/17/15   PT Start Time 4128   PT Stop Time 1632   PT Time Calculation (min) 45 min   Activity Tolerance Patient tolerated treatment well   Behavior During Therapy [facility] for tasks assessed/performed      Past Medical History  Diagnosis Date  . Allergy     seasonal, PCN, antidepressants, bentyl  . Asthma   . Carpal tunnel syndrome on both sides     right worse than left 2008  . Hyperlipidemia     LDL 108 JULY 2013  . Positive TB test     From MArch 2009 note : Recent F/u at [facility]. CXRAy negative.  Positive TST in 2007 (32m).  Unable to tolerate INH due to hepatotoxicity  . COPD (chronic obstructive pulmonary disease)   . Anemia   . GERD (gastroesophageal reflux disease)   . Chronic lower back pain     Past Surgical History  Procedure Laterality Date  . Induced abortion  2005  . Vaginal hysterectomy  1983    partial; for abnormal bleeding  . Breast lumpectomy Right 1990's  . Breast cyst excision Right 2011    There were no vitals filed for this visit.  Visit Diagnosis:  Joint stiffness of spine  Lumbar pain with radiation down right leg      Subjective Assessment - 04/26/15 1552    Subjective Low back pain  mild.  No leg pain now and has bad leg pain last night.  tried all exercises did not help , sleep helps.  Going to get in pool Friday.   Currently in Pain? Yes   Pain Score 8    Pain Location Back   Pain Orientation Lower   Pain Descriptors / Indicators Cramping;Constant;Aching   Pain Type Chronic pain   Pain Radiating Towards  buttocks, Rt   Pain Frequency Several days a week  leg   Aggravating Factors  pulling, pushing bending standing, sometimes does not not know.   Pain Relieving Factors Heat Ice    Multiple Pain Sites No                         [facility] Adult PT Treatment/Exercise - 04/26/15 1559    Lumbar Exercises: Stretches   Standing Extension 3 reps  in corner   Lumbar Exercises: Supine   Glut Set 10 reps   Bridge 10 reps  1 set on ball.   Other Supine Lumbar Exercises transversus abdominal with ball squeeze. 10 X noted quad pulling with this.   Lumbar Exercises: Prone   Single Arm Raise 5 reps   Single Arm Raises Limitations 5   Opposite Arm/Leg Raise 5 reps   Other Prone Lumbar Exercises sash   Lumbar Exercises: Quadruped   Single Arm Raise 5 reps   Straight Leg Raise 5 reps   Opposite Arm/Leg Raise 5 reps  pulling LT                PT Education - 04/26/15 1758    Education provided  Yes   Education Details posture ed scapular stabilization with bands   Person(s) Educated Patient   Methods Explanation;Demonstration;Tactile cues;Verbal cues;Handout   Comprehension Verbalized understanding          PT Short Term Goals - 04/26/15 1806    PT SHORT TERM GOAL #1   Title Pt will be I with initial HEP   Time 4   Period Weeks   Status Achieved   PT SHORT TERM GOAL #2   Title Pt will be able to stand from a sitting position without difficulty most of the time.    Baseline inconsistant   Time 4   Period Weeks   Status Partially Met   PT SHORT TERM GOAL #3   Title Pt will report improvement in LE symptoms to minimal (pain, tingling)   Baseline No leg Symptoms today . intermittant, not every day  severe leg pain yesterday   Time 4   Period Weeks   Status Partially Met           PT Long Term Goals - 04/26/15 1808    PT LONG TERM GOAL #3   Title Pt will be able to sit for 45 min and report min discomfort in low back   Baseline 10-15 minutes sitting  tolerance   Time 8   Period Weeks   Status Partially Met   PT LONG TERM GOAL #4   Title Pt will be able to walk for fitness for 30 min x >/=3 days per week, no increase in leg pain   Baseline 2 X aweek   Time 8   Period Weeks   Status Partially Met               Plan - 04/26/15 1800    Clinical Impression Statement Patient focuson stabilization exercises.  Issued posture band exercise because her pain improved the most with a corner stretch she peformed letting her whole body sag into a corner..  Her sitting is limited to 10 minutes prior to needing to get up.   PT Next Visit Plan progress core (try Reformer) and reinforce principles with ADLs   Consulted and Agree with Plan of Care Patient        Problem List Patient Active Problem List   Diagnosis Date Noted  . Irritation of oral cavity 03/01/2015  . Swelling of left lower extremity 03/01/2015  . Multiple nevi 03/01/2015  . Headache 01/25/2015  . H/O: hysterectomy 01/25/2015  . Orthostatic hypotension 11/18/2014  . Papulovesicular rash RLE 02/09/2014  . Carbon monoxide exposure 10/27/2013  . Dizziness, nonspecific 10/27/2013  . Fatigue 07/16/2013  . Sensorineural hearing loss of both ears 07/16/2013  . Cataract 06/11/2013  . Healthcare maintenance 06/11/2013  . Patellofemoral pain syndrome--R KNEE 03/17/2013  . Positive TB test   . Insomnia 06/05/2012  . Sinus headache 06/05/2012  . Back pain, acute on chronic 06/05/2012  . Nocturnal polyuria 06/05/2012  . COPD 03/17/2010  . Abdominal pain 08/16/2008  . CARPAL TUNNEL SYNDROME, BILATERAL 03/29/2008  . CONSTIPATION 01/28/2008  . Asthma 01/31/2007  . DEPRESSION, MAJOR, RECURRENT 11/14/2006  . GASTROESOPHAGEAL REFLUX, NO ESOPHAGITIS 11/14/2006  . Irritable bowel syndrome 11/14/2006    [facility] 04/26/2015, 6:14 PM  [facility] [address] [place], [place], [address] Phone: [phone]   Fax:   [phone]

## 2015-04-28 ENCOUNTER — Ambulatory Visit: Payer: No Typology Code available for payment source | Admitting: Physical Therapy

## 2015-04-29 ENCOUNTER — Ambulatory Visit: Payer: No Typology Code available for payment source | Admitting: Physical Therapy

## 2015-05-03 ENCOUNTER — Ambulatory Visit: Payer: No Typology Code available for payment source | Admitting: Physical Therapy

## 2015-05-03 DIAGNOSIS — M545 Low back pain, unspecified: Secondary | ICD-10-CM

## 2015-05-03 DIAGNOSIS — M2560 Stiffness of unspecified joint, not elsewhere classified: Secondary | ICD-10-CM

## 2015-05-03 DIAGNOSIS — M79604 Pain in right leg: Secondary | ICD-10-CM

## 2015-05-03 NOTE — Therapy (Signed)
[place], [place], [address] Phone: [phone]   Fax:  [phone]  Physical Therapy Treatment  Patient Details  Name: Aimee Davis MRN: [id number] Date of Birth: Jun 02, 1956 Referring Provider:  Liberty Handy, MD  Encounter Date: 05/03/2015      PT End of Session - 05/03/15 1638    Visit Number 12   Number of Visits 22   Date for PT Re-Evaluation 05/17/15   PT Start Time 1550   PT Stop Time 1630   PT Time Calculation (min) 40 min   Activity Tolerance Patient tolerated treatment well;No increased pain   Behavior During Therapy [facility] for tasks assessed/performed      Past Medical History  Diagnosis Date  . Allergy     seasonal, PCN, antidepressants, bentyl  . Asthma   . Carpal tunnel syndrome on both sides     right worse than left 2008  . Hyperlipidemia     LDL 108 JULY 2013  . Positive TB test     From MArch 2009 note : Recent F/u at [facility]. CXRAy negative.  Positive TST in 2007 (67m).  Unable to tolerate INH due to hepatotoxicity  . COPD (chronic obstructive pulmonary disease)   . Anemia   . GERD (gastroesophageal reflux disease)   . Chronic lower back pain     Past Surgical History  Procedure Laterality Date  . Induced abortion  2005  . Vaginal hysterectomy  1983    partial; for abnormal bleeding  . Breast lumpectomy Right 1990's  . Breast cyst excision Right 2011    There were no vitals filed for this visit.  Visit Diagnosis:  Joint stiffness of spine  Lumbar pain with radiation down right leg      Subjective Assessment - 05/03/15 1557    Subjective Was in water bouncing all kinds of exercises  It felt good.  8/10 now  back, not in legs.  Has been packing to get remodeled.   Currently in Pain? Yes   Pain Score 8    Pain Location Back   Pain Orientation Lower   Pain Descriptors / Indicators --  pain   Pain Radiating Towards no leg pain since week before last,  sometimes  into bottom   Aggravating Factors  packing apartment   Pain Relieving Factors heat ice                         [facility] Adult PT Treatment/Exercise - 05/03/15 1550    Lumbar Exercises: Stretches   Passive Hamstring Stretch 3 reps;30 seconds   Lower Trunk Rotation 5 reps   Lower Trunk Rotation Limitations painful to rt   Pelvic Tilt --  10 reps 5 seconds   Lumbar Exercises: Supine   Ab Set 10 reps  with small ball adductor isometric 10 reps   Clam 10 reps  blue band . hips wobbly, cues for abdominal 2 sets   Bridge 10 reps   Bridge Limitations ball under for 1 set of 10   Other Supine Lumbar Exercises dead bug , bent knees   Other Supine Lumbar Exercises scisors L2 with cues 5 reps                   PT Short Term Goals - 05/03/15 1642    PT SHORT TERM GOAL #1   Title Pt will be I with initial HEP   Time 4   Period Weeks  Status Achieved   PT SHORT TERM GOAL #2   Title Pt will be able to stand from a sitting position without difficulty most of the time.    Baseline inconsistant   Time 4   Period Weeks   Status Partially Met   PT SHORT TERM GOAL #3   Title Pt will report improvement in LE symptoms to minimal (pain, tingling)   Baseline none for over a week now   Time 4   Period Weeks   Status Achieved           PT Long Term Goals - 05/03/15 1642    PT LONG TERM GOAL #1   Title Pt will be I with advanced HEP   Time 8   Period Weeks   Status On-going   PT LONG TERM GOAL #2   Title Pt will be able to demo safe lifting techniques and understand posture, body mech    Time 8   Period Weeks   Status Partially Met   PT LONG TERM GOAL #3   Title Pt will be able to sit for 45 min and report min discomfort in low back   Baseline 10-15 minutes sitting tolerance   Time 8   Period Weeks   Status Partially Met   PT LONG TERM GOAL #4   Title Pt will be able to walk for fitness for 30 min x >/=3 days per week, no increase in leg pain   Baseline  2 X aweek   Time 8   Period Weeks   Status Partially Met   PT LONG TERM GOAL #5   Title Pt will be able to reach, squat and lift mod sized items without exacerbation of pain.    Baseline tries to do at home   Time 8   Period Weeks   Status On-going   PT LONG TERM GOAL #6   Title Patient will be able to stand 90 min with pain no more than 3/10   Time 8   Period Weeks   Status Unable to assess               Plan - 05/03/15 1639    Clinical Impression Statement Back pain increased with having to pack whole appartment for remodel.  All exercises today peformed while om moist heat.  Leg pain goal met.  Progress toward sitting goals.  In Pool  able to begin.   PT Next Visit Plan progress core (try Reformer) and reinforce principles with ADLs   Consulted and Agree with Plan of Care Patient        Problem List Patient Active Problem List   Diagnosis Date Noted  . Irritation of oral cavity 03/01/2015  . Swelling of left lower extremity 03/01/2015  . Multiple nevi 03/01/2015  . Headache 01/25/2015  . H/O: hysterectomy 01/25/2015  . Orthostatic hypotension 11/18/2014  . Papulovesicular rash RLE 02/09/2014  . Carbon monoxide exposure 10/27/2013  . Dizziness, nonspecific 10/27/2013  . Fatigue 07/16/2013  . Sensorineural hearing loss of both ears 07/16/2013  . Cataract 06/11/2013  . Healthcare maintenance 06/11/2013  . Patellofemoral pain syndrome--R KNEE 03/17/2013  . Positive TB test   . Insomnia 06/05/2012  . Sinus headache 06/05/2012  . Back pain, acute on chronic 06/05/2012  . Nocturnal polyuria 06/05/2012  . COPD 03/17/2010  . Abdominal pain 08/16/2008  . CARPAL TUNNEL SYNDROME, BILATERAL 03/29/2008  . CONSTIPATION 01/28/2008  . Asthma 01/31/2007  . DEPRESSION, MAJOR, RECURRENT 11/14/2006  . GASTROESOPHAGEAL  REFLUX, NO ESOPHAGITIS 11/14/2006  . Irritable bowel syndrome 11/14/2006    [facility] 05/03/2015, 4:46 PM  [facility] [address] [place], [place], [address] Phone: [phone]   Fax:  [phone]     Melvenia Needles, PTA 05/03/2015 4:46 PM Phone: [phone] Fax: [phone]

## 2015-05-05 ENCOUNTER — Ambulatory Visit: Payer: No Typology Code available for payment source | Admitting: Physical Therapy

## 2015-05-05 DIAGNOSIS — M79604 Pain in right leg: Secondary | ICD-10-CM

## 2015-05-05 DIAGNOSIS — M545 Low back pain, unspecified: Secondary | ICD-10-CM

## 2015-05-05 DIAGNOSIS — M2560 Stiffness of unspecified joint, not elsewhere classified: Secondary | ICD-10-CM

## 2015-05-05 NOTE — Therapy (Signed)
[place], [place], [address] Phone: [phone]   Fax:  [phone]  Physical Therapy Treatment  Patient Details  Name: Aimee Davis MRN: [id number] Date of Birth: 13-Sep-1956 Referring Provider:  Liberty Handy, MD  Encounter Date: 05/05/2015      PT End of Session - 05/05/15 1641    Visit Number 13   Number of Visits 22   Date for PT Re-Evaluation 05/17/15   PT Start Time 8088   PT Stop Time 1630   PT Time Calculation (min) 43 min   Activity Tolerance Patient tolerated treatment well   Behavior During Therapy [facility] for tasks assessed/performed      Past Medical History  Diagnosis Date  . Allergy     seasonal, PCN, antidepressants, bentyl  . Asthma   . Carpal tunnel syndrome on both sides     right worse than left 2008  . Hyperlipidemia     LDL 108 JULY 2013  . Positive TB test     From MArch 2009 note : Recent F/u at [facility]. CXRAy negative.  Positive TST in 2007 (68m).  Unable to tolerate INH due to hepatotoxicity  . COPD (chronic obstructive pulmonary disease)   . Anemia   . GERD (gastroesophageal reflux disease)   . Chronic lower back pain     Past Surgical History  Procedure Laterality Date  . Induced abortion  2005  . Vaginal hysterectomy  1983    partial; for abnormal bleeding  . Breast lumpectomy Right 1990's  . Breast cyst excision Right 2011    There were no vitals filed for this visit.  Visit Diagnosis:  Joint stiffness of spine  Lumbar pain with radiation down right leg      Subjective Assessment - 05/05/15 1558    Subjective 7/10,  gluteals ..Marland KitchenNo leg pain   Currently in Pain? Yes   Pain Score 7    Pain Location Back   Pain Orientation Lower                         [facility] Adult PT Treatment/Exercise - 05/05/15 1600    Lumbar Exercises: Stretches   Prone on Elbows Stretch --  6 reps 10 seconds to decrease pain   Lumbar Exercises: Supine   Other  Supine Lumbar Exercises Pilates Footwork 1 each blue, red  spring.  hip width. feet, heels,10 X, cues  wide heels ER, noted Rt hip stif, min guidance with hands,.  on heels hips IR the mos challanging with wobbles at terminal knee extension  and returning at pelvis, no bowing or knee hyperextension. 10 reps                 PT Education - 05/05/15 1802    Education provided Yes   Education Details Pilates mat class for back pain   Person(s) Educated Patient   Methods Explanation;Handout   Comprehension Verbalized understanding          PT Short Term Goals - 05/03/15 1642    PT SHORT TERM GOAL #1   Title Pt will be I with initial HEP   Time 4   Period Weeks   Status Achieved   PT SHORT TERM GOAL #2   Title Pt will be able to stand from a sitting position without difficulty most of the time.    Baseline inconsistant   Time 4   Period Weeks   Status Partially Met  PT SHORT TERM GOAL #3   Title Pt will report improvement in LE symptoms to minimal (pain, tingling)   Baseline none for over a week now   Time 4   Period Weeks   Status Achieved           PT Long Term Goals - 05/03/15 1642    PT LONG TERM GOAL #1   Title Pt will be I with advanced HEP   Time 8   Period Weeks   Status On-going   PT LONG TERM GOAL #2   Title Pt will be able to demo safe lifting techniques and understand posture, body mech    Time 8   Period Weeks   Status Partially Met   PT LONG TERM GOAL #3   Title Pt will be able to sit for 45 min and report min discomfort in low back   Baseline 10-15 minutes sitting tolerance   Time 8   Period Weeks   Status Partially Met   PT LONG TERM GOAL #4   Title Pt will be able to walk for fitness for 30 min x >/=3 days per week, no increase in leg pain   Baseline 2 X aweek   Time 8   Period Weeks   Status Partially Met   PT LONG TERM GOAL #5   Title Pt will be able to reach, squat and lift mod sized items without exacerbation of pain.    Baseline  tries to do at home   Time 8   Period Weeks   Status On-going   PT LONG TERM GOAL #6   Title Patient will be able to stand 90 min with pain no more than 3/10   Time 8   Period Weeks   Status Unable to assess               Plan - 05/05/15 1642    Clinical Impression Statement Some pain lumbar 6/10 last 3 reps on reformer.  Focus /progress toward strengthening goals,  None met  Patient declined need of modalities.   PT Next Visit Plan Answer ADL questions practice lifting.     Consulted and Agree with Plan of Care Patient        Problem List Patient Active Problem List   Diagnosis Date Noted  . Irritation of oral cavity 03/01/2015  . Swelling of left lower extremity 03/01/2015  . Multiple nevi 03/01/2015  . Headache 01/25/2015  . H/O: hysterectomy 01/25/2015  . Orthostatic hypotension 11/18/2014  . Papulovesicular rash RLE 02/09/2014  . Carbon monoxide exposure 10/27/2013  . Dizziness, nonspecific 10/27/2013  . Fatigue 07/16/2013  . Sensorineural hearing loss of both ears 07/16/2013  . Cataract 06/11/2013  . Healthcare maintenance 06/11/2013  . Patellofemoral pain syndrome--R KNEE 03/17/2013  . Positive TB test   . Insomnia 06/05/2012  . Sinus headache 06/05/2012  . Back pain, acute on chronic 06/05/2012  . Nocturnal polyuria 06/05/2012  . COPD 03/17/2010  . Abdominal pain 08/16/2008  . CARPAL TUNNEL SYNDROME, BILATERAL 03/29/2008  . CONSTIPATION 01/28/2008  . Asthma 01/31/2007  . DEPRESSION, MAJOR, RECURRENT 11/14/2006  . GASTROESOPHAGEAL REFLUX, NO ESOPHAGITIS 11/14/2006  . Irritable bowel syndrome 11/14/2006    [facility] 05/05/2015, 6:06 PM  [facility] [address] [place], [place], [address] Phone: [phone]   Fax:  [phone]     Melvenia Needles, PTA 05/05/2015 6:06 PM Phone: [phone] Fax: [phone]

## 2015-05-16 ENCOUNTER — Ambulatory Visit: Payer: No Typology Code available for payment source | Admitting: Physical Therapy

## 2015-05-16 DIAGNOSIS — M79604 Pain in right leg: Secondary | ICD-10-CM

## 2015-05-16 DIAGNOSIS — M2560 Stiffness of unspecified joint, not elsewhere classified: Secondary | ICD-10-CM

## 2015-05-16 DIAGNOSIS — M545 Low back pain, unspecified: Secondary | ICD-10-CM

## 2015-05-16 DIAGNOSIS — R209 Unspecified disturbances of skin sensation: Secondary | ICD-10-CM

## 2015-05-17 NOTE — Therapy (Signed)
[place], [place], [address] Phone: [phone]   Fax:  [phone]  Physical Therapy Treatment  Patient Details  Name: Aimee Davis MRN: [id number] Date of Birth: 04/18/1956 Referring Provider:  Liberty Handy, MD  Encounter Date: 05/16/2015      PT End of Session - 05/16/15 1531    Visit Number 14   Number of Visits 22   Date for PT Re-Evaluation 05/17/15   PT Start Time 0329   PT Stop Time 0355   PT Time Calculation (min) 26 min   Activity Tolerance Patient tolerated treatment well   Behavior During Therapy [facility] for tasks assessed/performed      Past Medical History  Diagnosis Date  . Allergy     seasonal, PCN, antidepressants, bentyl  . Asthma   . Carpal tunnel syndrome on both sides     right worse than left 2008  . Hyperlipidemia     LDL 108 JULY 2013  . Positive TB test     From MArch 2009 note : Recent F/u at [facility]. CXRAy negative.  Positive TST in 2007 (28m).  Unable to tolerate INH due to hepatotoxicity  . COPD (chronic obstructive pulmonary disease)   . Anemia   . GERD (gastroesophageal reflux disease)   . Chronic lower back pain     Past Surgical History  Procedure Laterality Date  . Induced abortion  2005  . Vaginal hysterectomy  1983    partial; for abnormal bleeding  . Breast lumpectomy Right 1990's  . Breast cyst excision Right 2011    There were no vitals filed for this visit.  Visit Diagnosis:  Joint stiffness of spine  Lumbar pain with radiation down right leg  Sensory disturbance      Subjective Assessment - 05/16/15 1527    Subjective I am in the process of moving and I cant do my exercises except in bed.  I am doing more lifting than normal   Pertinent History Rt. knee pain, chronic LBP   Diagnostic tests  Last lumbar xray 05/2013:Moderate facet arthritis at L3-4 with grade 1 spondylolisthesis.   Patient Stated Goals pain relief   Currently in Pain? Yes    Pain Score 7    Pain Location Back   Pain Orientation Lower;Right   Pain Descriptors / Indicators Aching   Pain Type Chronic pain   Pain Onset More than a month ago                         [facility]    Therapeutic Activites    Therapeutic Activities Other Therapeutic Activities   Other Therapeutic Activities instructed how to lie Left sidelying to stretch Right quadratus lumborum for long stretch   Moist Heat Therapy   Number Minutes Moist Heat 15 Minutes   Moist Heat Location Lumbar Spine   Electrical Stimulation   Electrical Stimulation Location low back   Electrical Stimulation Action IFC   Electrical Stimulation Parameters pt tolerance 15 min   Electrical Stimulation Goals Pain   Manual Therapy   Manual Therapy Soft tissue mobilization   Soft tissue mobilization Right side lying with Left Quadratus Lumborum soft tissue mobs                 PT Education - 05/16/15 1542    Education provided Yes   Education Details Pt educated on Quadratus Lumborum stretch in Left sidelying  Person(s) Educated Patient   Methods Explanation;Demonstration;Verbal cues   Comprehension Verbalized understanding;Returned demonstration          PT Short Term Goals - 05/03/15 1642    PT SHORT TERM GOAL #1   Title Pt will be I with initial HEP   Time 4   Period Weeks   Status Achieved   PT SHORT TERM GOAL #2   Title Pt will be able to stand from a sitting position without difficulty most of the time.    Baseline inconsistant   Time 4   Period Weeks   Status Partially Met   PT SHORT TERM GOAL #3   Title Pt will report improvement in LE symptoms to minimal (pain, tingling)   Baseline none for over a week now   Time 4   Period Weeks   Status Achieved           PT Long Term Goals - 05/03/15 1642    PT LONG TERM GOAL #1   Title Pt will be I with advanced HEP   Time 8   Period Weeks   Status On-going   PT LONG  TERM GOAL #2   Title Pt will be able to demo safe lifting techniques and understand posture, body mech    Time 8   Period Weeks   Status Partially Met   PT LONG TERM GOAL #3   Title Pt will be able to sit for 45 min and report min discomfort in low back   Baseline 10-15 minutes sitting tolerance   Time 8   Period Weeks   Status Partially Met   PT LONG TERM GOAL #4   Title Pt will be able to walk for fitness for 30 min x >/=3 days per week, no increase in leg pain   Baseline 2 X aweek   Time 8   Period Weeks   Status Partially Met   PT LONG TERM GOAL #5   Title Pt will be able to reach, squat and lift mod sized items without exacerbation of pain.    Baseline tries to do at home   Time 8   Period Weeks   Status On-going   PT LONG TERM GOAL #6   Title Patient will be able to stand 90 min with pain no more than 3/10   Time 8   Period Weeks   Status Unable to assess               Plan - 05/16/15 1532    Clinical Impression Statement Pt enters clinic late for appt with limited time.  Pt with 7/10 pain in low back and agrees to E stim and moist heat as it has helped in the past.  Pt also recieved soft tissue mobilization of Right Quadratus Lumborum in left sidelying to increase comfort and decrease low back pain of patient since arriving late for visit   Pt will benefit from skilled therapeutic intervention in order to improve on the following deficits Decreased range of motion;Difficulty walking;Increased fascial restricitons;Impaired UE functional use;Increased muscle spasms;Decreased endurance;Decreased activity tolerance;Pain;Improper body mechanics;Impaired flexibility;Decreased strength;Decreased mobility;Impaired sensation   Rehab Potential Good   PT Frequency 2x / week   PT Duration 6 weeks   PT Treatment/Interventions ADLs/Self Care Home Management;Functional mobility training;Patient/family education;Passive range of motion;Dry needling;Therapeutic activities;Moist  Heat;Traction;Ultrasound;Cryotherapy;Electrical Stimulation;Stair training;Gait training;Taping;Balance training;Manual techniques;Neuromuscular re-education;Therapeutic exercise   PT Next Visit Plan Progress HEP as pt is able, Add QL stretch to HEP if helpful.  Assess  goals   PT Home Exercise Plan Quadratus Lumborum sidelying stretch.         Problem List Patient Active Problem List   Diagnosis Date Noted  . Irritation of oral cavity 03/01/2015  . Swelling of left lower extremity 03/01/2015  . Multiple nevi 03/01/2015  . Headache 01/25/2015  . H/O: hysterectomy 01/25/2015  . Orthostatic hypotension 11/18/2014  . Papulovesicular rash RLE 02/09/2014  . Carbon monoxide exposure 10/27/2013  . Dizziness, nonspecific 10/27/2013  . Fatigue 07/16/2013  . Sensorineural hearing loss of both ears 07/16/2013  . Cataract 06/11/2013  . Healthcare maintenance 06/11/2013  . Patellofemoral pain syndrome--R KNEE 03/17/2013  . Positive TB test   . Insomnia 06/05/2012  . Sinus headache 06/05/2012  . Back pain, acute on chronic 06/05/2012  . Nocturnal polyuria 06/05/2012  . COPD 03/17/2010  . Abdominal pain 08/16/2008  . CARPAL TUNNEL SYNDROME, BILATERAL 03/29/2008  . CONSTIPATION 01/28/2008  . Asthma 01/31/2007  . DEPRESSION, MAJOR, RECURRENT 11/14/2006  . GASTROESOPHAGEAL REFLUX, NO ESOPHAGITIS 11/14/2006  . Irritable bowel syndrome 11/14/2006   Voncille Lo, PT 05/17/2015 7:49 AM Phone: [phone] Fax: [facility] [place], [place], [address] Phone: [phone]   Fax:  [phone]

## 2015-05-20 ENCOUNTER — Ambulatory Visit: Payer: No Typology Code available for payment source | Attending: Internal Medicine | Admitting: Physical Therapy

## 2015-05-20 DIAGNOSIS — R208 Other disturbances of skin sensation: Secondary | ICD-10-CM | POA: Insufficient documentation

## 2015-05-20 DIAGNOSIS — M79604 Pain in right leg: Secondary | ICD-10-CM

## 2015-05-20 DIAGNOSIS — M545 Low back pain, unspecified: Secondary | ICD-10-CM

## 2015-05-20 DIAGNOSIS — R209 Unspecified disturbances of skin sensation: Secondary | ICD-10-CM

## 2015-05-20 DIAGNOSIS — M2560 Stiffness of unspecified joint, not elsewhere classified: Secondary | ICD-10-CM | POA: Insufficient documentation

## 2015-05-20 NOTE — Therapy (Signed)
[place], [place], [address] Phone: [phone]   Fax:  [phone]  Physical Therapy Treatment/Renewal Patient Details  Name: Aimee Davis MRN: [id number] Date of Birth: 08-20-1956 Referring Provider:  Liberty Handy, MD  Encounter Date: 05/20/2015      PT End of Session - 05/20/15 0935    Visit Number 15   Number of Visits 23   Date for PT Re-Evaluation 06/17/15   PT Start Time 0930   PT Stop Time 1030   PT Time Calculation (min) 60 min      Past Medical History  Diagnosis Date  . Allergy     seasonal, PCN, antidepressants, bentyl  . Asthma   . Carpal tunnel syndrome on both sides     right worse than left 2008  . Hyperlipidemia     LDL 108 JULY 2013  . Positive TB test     From MArch 2009 note : Recent F/u at [facility]. CXRAy negative.  Positive TST in 2007 (62m).  Unable to tolerate INH due to hepatotoxicity  . COPD (chronic obstructive pulmonary disease)   . Anemia   . GERD (gastroesophageal reflux disease)   . Chronic lower back pain     Past Surgical History  Procedure Laterality Date  . Induced abortion  2005  . Vaginal hysterectomy  1983    partial; for abnormal bleeding  . Breast lumpectomy Right 1990's  . Breast cyst excision Right 2011    There were no vitals filed for this visit.  Visit Diagnosis:  Joint stiffness of spine  Lumbar pain with radiation down right leg  Sensory disturbance      Subjective Assessment - 05/20/15 0943    Subjective Pt finding it hard to do exercises due to home renovations. [facility] and use gym.  Would like to be renewed for more therapy.    Currently in Pain? Yes   Pain Score 7                          [facility] Adult PT Treatment/Exercise - 05/20/15 0947    Lumbar Exercises: Stretches   Passive Hamstring Stretch 3 reps;30 seconds   Piriformis Stretch 3 reps;30 seconds   Lumbar Exercises:  Sidelying   Other Sidelying Lumbar Exercises Sidelying QL stretch x 2 minutes for right side  towel roll   Lumbar Exercises: Prone   Single Arm Raise 5 reps   Straight Leg Raise 5 reps   Opposite Arm/Leg Raise 5 reps  unable to follow directions   Other Prone Lumbar Exercises multifidus press with knee flexion x 10 each, hip ext x 10 each   Lumbar Exercises: Quadruped   [place] 5 reps   Single Arm Raise 5 reps   Straight Leg Raise 5 reps   Opposite Arm/Leg Raise 5 reps  pulling LT   Moist Heat Therapy   Number Minutes Moist Heat 15 Minutes   Moist Heat Location Lumbar Spine   Electrical Stimulation   Electrical Stimulation Location low back   Electrical Stimulation Action IFC    Electrical Stimulation Parameters to tolerance   Electrical Stimulation Goals Pain                  PT Short Term Goals - 05/03/15 1642    PT SHORT TERM GOAL #1   Title Pt will be I with initial HEP   Time  4   Period Weeks   Status Achieved   PT SHORT TERM GOAL #2   Title Pt will be able to stand from a sitting position without difficulty most of the time.    Baseline inconsistant   Time 4   Period Weeks   Status Partially Met   PT SHORT TERM GOAL #3   Title Pt will report improvement in LE symptoms to minimal (pain, tingling)   Baseline none for over a week now   Time 4   Period Weeks   Status Achieved           PT Long Term Goals - 05/20/15 0934    PT LONG TERM GOAL #1   Title Pt will be I with advanced HEP   Time 8   Period Weeks   Status On-going   PT LONG TERM GOAL #2   Title Pt will be able to demo safe lifting techniques and understand posture, body mech    Time 8   Period Weeks   Status Partially Met   PT LONG TERM GOAL #3   Title Pt will be able to sit for 45 min and report min discomfort in low back   Time 8   Period Weeks   Status Partially Met  20 min   PT LONG TERM GOAL #4   Title Pt will be able to walk for fitness for 30 min x >/=3 days per  week, no increase in leg pain   Time 8   Period Weeks   Status Achieved   PT LONG TERM GOAL #5   Title Pt will be able to reach, squat and lift mod sized items without exacerbation of pain.    Time 8   Period Weeks   Status On-going   PT LONG TERM GOAL #6   Title Patient will be able to stand 90 min with pain no more than 3/10   Time 8   Period Weeks   Status On-going  20 minutes               Plan - 05/20/15 0936    Clinical Impression Statement Pt reports 50% decrease in pain since beginning PT. Pt plans to join gym and perform exercises. STG# 1,3 met, LTG#4 met. Pt rates pain at 5/10 at end of treatment prior to modalities.    PT Next Visit Plan continue QL stretch, core strength, modalities        Problem List Patient Active Problem List   Diagnosis Date Noted  . Irritation of oral cavity 03/01/2015  . Swelling of left lower extremity 03/01/2015  . Multiple nevi 03/01/2015  . Headache 01/25/2015  . H/O: hysterectomy 01/25/2015  . Orthostatic hypotension 11/18/2014  . Papulovesicular rash RLE 02/09/2014  . Carbon monoxide exposure 10/27/2013  . Dizziness, nonspecific 10/27/2013  . Fatigue 07/16/2013  . Sensorineural hearing loss of both ears 07/16/2013  . Cataract 06/11/2013  . Healthcare maintenance 06/11/2013  . Patellofemoral pain syndrome--R KNEE 03/17/2013  . Positive TB test   . Insomnia 06/05/2012  . Sinus headache 06/05/2012  . Back pain, acute on chronic 06/05/2012  . Nocturnal polyuria 06/05/2012  . COPD 03/17/2010  . Abdominal pain 08/16/2008  . CARPAL TUNNEL SYNDROME, BILATERAL 03/29/2008  . CONSTIPATION 01/28/2008  . Asthma 01/31/2007  . DEPRESSION, MAJOR, RECURRENT 11/14/2006  . GASTROESOPHAGEAL REFLUX, NO ESOPHAGITIS 11/14/2006  . Irritable bowel syndrome 11/14/2006    PAA,JENNIFER, PTA 05/20/2015, 12:13 PM  [facility]  [place], [place], [address] Phone: [phone]    Fax:  [phone]  Raeford Razor, PT 05/20/2015 12:14 PM Phone: [phone] Fax: [phone]

## 2015-05-25 ENCOUNTER — Ambulatory Visit: Payer: No Typology Code available for payment source | Admitting: Physical Therapy

## 2015-05-25 DIAGNOSIS — M545 Low back pain, unspecified: Secondary | ICD-10-CM

## 2015-05-25 DIAGNOSIS — M2560 Stiffness of unspecified joint, not elsewhere classified: Secondary | ICD-10-CM

## 2015-05-25 DIAGNOSIS — M79604 Pain in right leg: Secondary | ICD-10-CM

## 2015-05-25 NOTE — Therapy (Signed)
[place], [place], [address] Phone: [phone]   Fax:  [phone]  Physical Therapy Treatment  Patient Details  Name: Aimee Davis MRN: [id number] Date of Birth: 10/06/1955 Referring Provider:  Liberty Handy, MD  Encounter Date: 05/25/2015      PT End of Session - 05/25/15 1753    Visit Number 16   Number of Visits 23   Date for PT Re-Evaluation 06/17/15   PT Start Time 4503   PT Stop Time 1600   PT Time Calculation (min) 42 min   Activity Tolerance Patient tolerated treatment well;No increased pain   Behavior During Therapy [facility] for tasks assessed/performed      Past Medical History  Diagnosis Date  . Allergy     seasonal, PCN, antidepressants, bentyl  . Asthma   . Carpal tunnel syndrome on both sides     right worse than left 2008  . Hyperlipidemia     LDL 108 JULY 2013  . Positive TB test     From MArch 2009 note : Recent F/u at [facility]. CXRAy negative.  Positive TST in 2007 (28m).  Unable to tolerate INH due to hepatotoxicity  . COPD (chronic obstructive pulmonary disease)   . Anemia   . GERD (gastroesophageal reflux disease)   . Chronic lower back pain     Past Surgical History  Procedure Laterality Date  . Induced abortion  2005  . Vaginal hysterectomy  1983    partial; for abnormal bleeding  . Breast lumpectomy Right 1990's  . Breast cyst excision Right 2011    There were no vitals filed for this visit.  Visit Diagnosis:  Joint stiffness of spine  Lumbar pain with radiation down right leg      Subjective Assessment - 05/25/15 1537    Subjective Rt knee 7/10 interfers with walking.  Exercises 1 X a week walking.  20 to 30 minutes  6/10.  Picked 2- 6 packs of gingerale up wrong and hurt her back.  Carried them in a tote in 1 hand and it was painful.  Back is a lot betterNow.  No radiating leg pain.     Currently in Pain? Yes   Pain Score 6    Pain Location Back   Pain  Orientation Right;Lower   Pain Descriptors / Indicators Aching   Pain Type Chronic pain   Aggravating Factors  carrying, lifting   Pain Relieving Factors heat.     Multiple Pain Sites --  Rt knee pain                         [facility] Adult PT Treatment/Exercise - 05/25/15 1518    Lumbar Exercises: Stretches   Passive Hamstring Stretch 3 reps;30 seconds   Single Knee to Chest Stretch 2 reps;30 seconds   Piriformis Stretch 3 reps;30 seconds   Lumbar Exercises: Supine   Ab Set 5 seconds;5 reps   Bent Knee Raise 5 reps  cues , alternating.    Bridge 10 reps   Moist Heat Therapy   Number Minutes Moist Heat 20 Minutes   Moist Heat Location Lumbar Spine                  PT Short Term Goals - 05/25/15 1757    PT SHORT TERM GOAL #1   Title Pt will be I with initial HEP   Time 4   Period Weeks   Status Achieved  PT SHORT TERM GOAL #2   Title Pt will be able to stand from a sitting position without difficulty most of the time.    Baseline knee bothers   Time 4   Period Weeks   Status Partially Met   PT SHORT TERM GOAL #3   Title Pt will report improvement in LE symptoms to minimal (pain, tingling)   Status Achieved           PT Long Term Goals - 05/25/15 1757    PT LONG TERM GOAL #1   Title Pt will be I with advanced HEP   Baseline independent so far   Time 8   Period Weeks   Status On-going   PT LONG TERM GOAL #2   Title Pt will be able to demo safe lifting techniques and understand posture, body mech    Time 8   Period Weeks   Status On-going   PT LONG TERM GOAL #3   Title Pt will be able to sit for 45 min and report min discomfort in low back   Baseline 30-+ minutes sometimes longer   Time 8   Period Weeks   Status Partially Met   PT LONG TERM GOAL #4   Title Pt will be able to walk for fitness for 30 min x >/=3 days per week, no increase in leg pain   Baseline 1 day a week 30 minutes ( had been achieved previously.)   Time 8    Period Weeks   Status Partially Met   PT LONG TERM GOAL #5   Title Pt will be able to reach, squat and lift mod sized items without exacerbation of pain.    Baseline tries to do at home, knee llimits   Time 8   Period Weeks   Status On-going               Plan - 05/25/15 1754    Clinical Impression Statement shorter session, patient late.  Her home exercises are limited due to remodel at appartment.  Leg pain is mainly her knee.  Patient has not been walking for exercise more than 1 X a weeK. (Due to knee pain) She feels she has enough home exercises.  Her radiating leg pain has not returned.   PT Next Visit Plan continue QL stretch, core strength, modalities   PT Home Exercise Plan exercise more   Consulted and Agree with Plan of Care Patient        Problem List Patient Active Problem List   Diagnosis Date Noted  . Irritation of oral cavity 03/01/2015  . Swelling of left lower extremity 03/01/2015  . Multiple nevi 03/01/2015  . Headache 01/25/2015  . H/O: hysterectomy 01/25/2015  . Orthostatic hypotension 11/18/2014  . Papulovesicular rash RLE 02/09/2014  . Carbon monoxide exposure 10/27/2013  . Dizziness, nonspecific 10/27/2013  . Fatigue 07/16/2013  . Sensorineural hearing loss of both ears 07/16/2013  . Cataract 06/11/2013  . Healthcare maintenance 06/11/2013  . Patellofemoral pain syndrome--R KNEE 03/17/2013  . Positive TB test   . Insomnia 06/05/2012  . Sinus headache 06/05/2012  . Back pain, acute on chronic 06/05/2012  . Nocturnal polyuria 06/05/2012  . COPD 03/17/2010  . Abdominal pain 08/16/2008  . CARPAL TUNNEL SYNDROME, BILATERAL 03/29/2008  . CONSTIPATION 01/28/2008  . Asthma 01/31/2007  . DEPRESSION, MAJOR, RECURRENT 11/14/2006  . GASTROESOPHAGEAL REFLUX, NO ESOPHAGITIS 11/14/2006  . Irritable bowel syndrome 11/14/2006    HARRIS,KAREN 05/25/2015, 6:00 PM  [place]  [facility] [address] [place], [place], [address] Phone: [phone]   Fax:  [phone]     Melvenia Needles, PTA 05/25/2015 6:00 PM Phone: [phone] Fax: [phone]

## 2015-05-27 ENCOUNTER — Encounter: Payer: No Typology Code available for payment source | Admitting: Physical Therapy

## 2015-05-30 ENCOUNTER — Ambulatory Visit: Payer: No Typology Code available for payment source | Admitting: Physical Therapy

## 2015-06-01 ENCOUNTER — Ambulatory Visit: Payer: No Typology Code available for payment source | Admitting: Physical Therapy

## 2015-06-01 DIAGNOSIS — M545 Low back pain, unspecified: Secondary | ICD-10-CM

## 2015-06-01 DIAGNOSIS — M2560 Stiffness of unspecified joint, not elsewhere classified: Secondary | ICD-10-CM

## 2015-06-01 DIAGNOSIS — M79604 Pain in right leg: Secondary | ICD-10-CM

## 2015-06-01 NOTE — Therapy (Signed)
[place], [place], [address] Phone: [phone]   Fax:  [phone]  Physical Therapy Treatment  Patient Details  Name: Aimee Davis MRN: [id number] Date of Birth: 1956/05/28 Referring Provider:  Liberty Handy, MD  Encounter Date: 06/01/2015      PT End of Session - 06/01/15 1640    Visit Number 17   Number of Visits 23   Date for PT Re-Evaluation 06/17/15   PT Start Time 3557   PT Stop Time 3220   PT Time Calculation (min) 59 min   Activity Tolerance Patient tolerated treatment well;No increased pain   Behavior During Therapy [facility] for tasks assessed/performed      Past Medical History  Diagnosis Date  . Allergy     seasonal, PCN, antidepressants, bentyl  . Asthma   . Carpal tunnel syndrome on both sides     right worse than left 2008  . Hyperlipidemia     LDL 108 JULY 2013  . Positive TB test     From MArch 2009 note : Recent F/u at [facility]. CXRAy negative.  Positive TST in 2007 (39m).  Unable to tolerate INH due to hepatotoxicity  . COPD (chronic obstructive pulmonary disease)   . Anemia   . GERD (gastroesophageal reflux disease)   . Chronic lower back pain     Past Surgical History  Procedure Laterality Date  . Induced abortion  2005  . Vaginal hysterectomy  1983    partial; for abnormal bleeding  . Breast lumpectomy Right 1990's  . Breast cyst excision Right 2011    There were no vitals filed for this visit.  Visit Diagnosis:  Lumbar pain with radiation down right leg  Joint stiffness of spine      Subjective Assessment - 06/01/15 1554    Subjective Rt knee better today.  Back 8/10 .  May have slept wrong.  Woke up with Stomach spasms (irritable Bowel)  Moves from stomack to back.  Walked Friday, Sat and Monday for exercises.   No radiating pain into gluteals.   Currently in Pain? Yes   Pain Score 8    Pain Location Back   Pain Orientation Right;Left   Pain Descriptors /  Indicators Aching   Pain Radiating Towards no radiating pain   Aggravating Factors  irritable bowel sxmptoms make back pain worse   Pain Relieving Factors walking                         [facility] Adult PT Treatment/Exercise - 06/01/15 1550    Therapeutic Activites    Therapeutic Activities Lifting   Lifting simulate lifting, practiced red ball from floor from mat, carry.  she keeps spine neutral.  Rt knee pain limits some.     Moist Heat Therapy   Number Minutes Moist Heat 15 Minutes   Moist Heat Location Lumbar Spine   Manual Therapy   Manual Therapy Soft tissue mobilization   Soft tissue mobilization Rt low back.  tissue softened, less pain.  prone over 2 pillows.                   PT Short Term Goals - 06/01/15 1601    PT SHORT TERM GOAL #1   Title Pt will be I with initial HEP   Status Achieved   PT SHORT TERM GOAL #2   Title Pt will be able to stand from a sitting position without difficulty most  of the time.    Baseline difficulty with this if her back bothers her   Time 4   Period Weeks   Status On-going   PT SHORT TERM GOAL #3   Title Pt will report improvement in LE symptoms to minimal (pain, tingling)   Status Achieved           PT Long Term Goals - 06/01/15 1603    PT LONG TERM GOAL #1   Title Pt will be I with advanced HEP   Baseline independent so far   Time 8   Period Weeks   Status On-going   PT LONG TERM GOAL #2   Title Pt will be able to demo safe lifting techniques and understand posture, body mech    Time 8   Period Weeks   Status On-going   PT LONG TERM GOAL #3   Title Pt will be able to sit for 45 min and report min discomfort in low back   Baseline 45   Time 8   Period Weeks   Status Achieved   PT LONG TERM GOAL #4   Title Pt will be able to walk for fitness for 30 min x >/=3 days per week, no increase in leg pain   Baseline 3 X since last visit   Time 8   Period Weeks   Status Partially Met   PT LONG TERM  GOAL #5   Title Pt will be able to reach, squat and lift mod sized items without exacerbation of pain.    Baseline 7/10 pain with these   Time 8   Period Weeks   Status On-going   PT LONG TERM GOAL #6   Title Patient will be able to stand 90 min with pain no more than 3/10   Baseline 1 hour max standing limited by pain 7/10   Time 8   Period Weeks   Status On-going               Plan - 06/01/15 1641    Clinical Impression Statement 1 PT session this week.  Able to achieve sitting and progress toward walking goals.  Pain centralized  today.  No pain after session.   PT Next Visit Plan continue to encourage walking stabilization.     Consulted and Agree with Plan of Care Patient        Problem List Patient Active Problem List   Diagnosis Date Noted  . Irritation of oral cavity 03/01/2015  . Swelling of left lower extremity 03/01/2015  . Multiple nevi 03/01/2015  . Headache 01/25/2015  . H/O: hysterectomy 01/25/2015  . Orthostatic hypotension 11/18/2014  . Papulovesicular rash RLE 02/09/2014  . Carbon monoxide exposure 10/27/2013  . Dizziness, nonspecific 10/27/2013  . Fatigue 07/16/2013  . Sensorineural hearing loss of both ears 07/16/2013  . Cataract 06/11/2013  . Healthcare maintenance 06/11/2013  . Patellofemoral pain syndrome--R KNEE 03/17/2013  . Positive TB test   . Insomnia 06/05/2012  . Sinus headache 06/05/2012  . Back pain, acute on chronic 06/05/2012  . Nocturnal polyuria 06/05/2012  . COPD 03/17/2010  . Abdominal pain 08/16/2008  . CARPAL TUNNEL SYNDROME, BILATERAL 03/29/2008  . CONSTIPATION 01/28/2008  . Asthma 01/31/2007  . DEPRESSION, MAJOR, RECURRENT 11/14/2006  . GASTROESOPHAGEAL REFLUX, NO ESOPHAGITIS 11/14/2006  . Irritable bowel syndrome 11/14/2006    [facility] 06/01/2015, 4:51 PM  [facility] [address] [place], [place], [address] Phone: [phone]   Fax:   [phone]  Melvenia Needles, PTA 06/01/2015 4:51 PM Phone: [phone] Fax: [phone]

## 2015-06-06 ENCOUNTER — Ambulatory Visit: Payer: No Typology Code available for payment source | Admitting: Physical Therapy

## 2015-06-06 DIAGNOSIS — M2560 Stiffness of unspecified joint, not elsewhere classified: Secondary | ICD-10-CM

## 2015-06-06 DIAGNOSIS — M79604 Pain in right leg: Secondary | ICD-10-CM

## 2015-06-06 DIAGNOSIS — M545 Low back pain, unspecified: Secondary | ICD-10-CM

## 2015-06-06 NOTE — Therapy (Signed)
[place], [place], [address] Phone: [phone]   Fax:  [phone]  Physical Therapy Treatment  Patient Details  Name: Aimee Davis MRN: [id number] Date of Birth: 1956/01/30 Referring Provider:  Liberty Handy, MD  Encounter Date: 06/06/2015      PT End of Session - 06/06/15 1753    Visit Number 18   Number of Visits 23   Date for PT Re-Evaluation 06/17/15   PT Start Time 6294   PT Stop Time 1656   PT Time Calculation (min) 60 min   Activity Tolerance Patient tolerated treatment well;No increased pain   Behavior During Therapy [facility] for tasks assessed/performed      Past Medical History  Diagnosis Date  . Allergy     seasonal, PCN, antidepressants, bentyl  . Asthma   . Carpal tunnel syndrome on both sides     right worse than left 2008  . Hyperlipidemia     LDL 108 JULY 2013  . Positive TB test     From MArch 2009 note : Recent F/u at [facility]. CXRAy negative.  Positive TST in 2007 (5m).  Unable to tolerate INH due to hepatotoxicity  . COPD (chronic obstructive pulmonary disease)   . Anemia   . GERD (gastroesophageal reflux disease)   . Chronic lower back pain     Past Surgical History  Procedure Laterality Date  . Induced abortion  2005  . Vaginal hysterectomy  1983    partial; for abnormal bleeding  . Breast lumpectomy Right 1990's  . Breast cyst excision Right 2011    There were no vitals filed for this visit.  Visit Diagnosis:  Lumbar pain with radiation down right leg  Joint stiffness of spine      Subjective Assessment - 06/06/15 1556    Subjective 6/10 now was up to 8/10.  Kept doing her exercises before she went to sleep                         [facility] - [facility] 1557    Lumbar Exercises: Stretches   Passive Hamstring Stretch 3 reps;30 seconds   Prone Mid Back Stretch Limitations quadratus lumborum stretches   Piriformis  Stretch 3 reps;30 seconds   Lumbar Exercises: Supine   Bridge 10 reps   Other Supine Lumbar Exercises transversus abdominus work   Lumbar Exercises: Sidelying   Other Sidelying Lumbar Exercises trunk PNF passive stretch to active then resistive Sidelying LT.   Moist Heat Therapy   Number Minutes Moist Heat 15 Minutes   Moist Heat Location Lumbar Spine  sidelying while stretched over pillow.     Ultrasound   Ultrasound Location Rt quadratus   Ultrasound Parameters 50% 1.3 watts/cm2, 8 minutes   Ultrasound Goals Pain   Manual Therapy   Manual Therapy Soft tissue mobilization   Soft tissue mobilization Rt low back.  tissue softened, less pain.  prone over 2 pillows.   quadratus lumborum low back upper gluteals                PT Education - 06/06/15 1752    Education provided Yes   Education Details how to use heat at home.   Person(s) Educated Patient   Methods Explanation;Handout   Comprehension Verbalized understanding          PT Short Term Goals - 06/06/15 1802    PT SHORT TERM GOAL #1   Title Pt  will be I with initial HEP   Status Achieved   PT SHORT TERM GOAL #2   Title Pt will be able to stand from a sitting position without difficulty most of the time.    Baseline still hurts   Time 4   Status On-going   PT SHORT TERM GOAL #3   Title Pt will report improvement in LE symptoms to minimal (pain, tingling)   Status Achieved           PT Long Term Goals - 06/06/15 1803    PT LONG TERM GOAL #1   Title Pt will be I with advanced HEP   Baseline fewer cues   Time 8   Period Weeks   Status On-going   PT LONG TERM GOAL #2   Title Pt will be able to demo safe lifting techniques and understand posture, body mech    Baseline verbalizes good technique   Time 8   Period Weeks   Status Partially Met   PT LONG TERM GOAL #3   Title Pt will be able to sit for 45 min and report min discomfort in low back   Time 8   Period Weeks   Status Achieved   PT LONG  TERM GOAL #4   Title Pt will be able to walk for fitness for 30 min x >/=3 days per week, no increase in leg pain   Time 8   Period Weeks   Status Unable to assess   PT LONG TERM GOAL #5   Title Pt will be able to reach, squat and lift mod sized items without exacerbation of pain.    Baseline knee pain limits some   Time 8   Period Weeks   Status On-going               Plan - 06/06/15 1804    PT Next Visit Plan continue to encourage walking stabilization. Finalize home exercise POC ends 06/17/2015  check lifting technique, chech sitting tolerance   Consulted and Agree with Plan of Care Patient        Problem List Patient Active Problem List   Diagnosis Date Noted  . Irritation of oral cavity 03/01/2015  . Swelling of left lower extremity 03/01/2015  . Multiple nevi 03/01/2015  . Headache 01/25/2015  . H/O: hysterectomy 01/25/2015  . Orthostatic hypotension 11/18/2014  . Papulovesicular rash RLE 02/09/2014  . Carbon monoxide exposure 10/27/2013  . Dizziness, nonspecific 10/27/2013  . Fatigue 07/16/2013  . Sensorineural hearing loss of both ears 07/16/2013  . Cataract 06/11/2013  . Healthcare maintenance 06/11/2013  . Patellofemoral pain syndrome--R KNEE 03/17/2013  . Positive TB test   . Insomnia 06/05/2012  . Sinus headache 06/05/2012  . Back pain, acute on chronic 06/05/2012  . Nocturnal polyuria 06/05/2012  . COPD 03/17/2010  . Abdominal pain 08/16/2008  . CARPAL TUNNEL SYNDROME, BILATERAL 03/29/2008  . CONSTIPATION 01/28/2008  . Asthma 01/31/2007  . DEPRESSION, MAJOR, RECURRENT 11/14/2006  . GASTROESOPHAGEAL REFLUX, NO ESOPHAGITIS 11/14/2006  . Irritable bowel syndrome 11/14/2006    [facility] 06/06/2015, 6:06 PM  [facility] [address] [place], [place], [address] Phone: [phone]   Fax:  [phone]     Melvenia Needles, PTA 06/06/2015 6:06 PM Phone: [phone] Fax: [phone]

## 2015-06-09 ENCOUNTER — Ambulatory Visit: Payer: No Typology Code available for payment source | Admitting: Physical Therapy

## 2015-06-09 ENCOUNTER — Encounter: Payer: No Typology Code available for payment source | Admitting: Physical Therapy

## 2015-06-13 ENCOUNTER — Ambulatory Visit: Payer: No Typology Code available for payment source | Admitting: Physical Therapy

## 2015-06-13 DIAGNOSIS — M545 Low back pain, unspecified: Secondary | ICD-10-CM

## 2015-06-13 DIAGNOSIS — M2560 Stiffness of unspecified joint, not elsewhere classified: Secondary | ICD-10-CM

## 2015-06-13 DIAGNOSIS — M79604 Pain in right leg: Secondary | ICD-10-CM

## 2015-06-13 NOTE — Therapy (Signed)
[name], [place], [address] Phone: [phone]   Fax:  [phone]  Physical Therapy Treatment  Patient Details  Name: Aimee Davis MRN: [id number] Date of Birth: 21-Jul-1956 Referring Provider:  No ref. provider found  Encounter Date: 06/13/2015      PT End of Session - 06/13/15 1800    Visit Number 19   Number of Visits 23   Date for PT Re-Evaluation 06/17/15   PT Start Time 7106   PT Stop Time 1646   PT Time Calculation (min) 60 min   Activity Tolerance Patient tolerated treatment well;No increased pain   Behavior During Therapy [facility] for tasks assessed/performed      Past Medical History  Diagnosis Date  . Allergy     seasonal, PCN, antidepressants, bentyl  . Asthma   . Carpal tunnel syndrome on both sides     right worse than left 2008  . Hyperlipidemia     LDL 108 JULY 2013  . Positive TB test     From MArch 2009 note : Recent F/u at [facility]. CXRAy negative.  Positive TST in 2007 (7m).  Unable to tolerate INH due to hepatotoxicity  . COPD (chronic obstructive pulmonary disease)   . Anemia   . GERD (gastroesophageal reflux disease)   . Chronic lower back pain     Past Surgical History  Procedure Laterality Date  . Induced abortion  2005  . Vaginal hysterectomy  1983    partial; for abnormal bleeding  . Breast lumpectomy Right 1990's  . Breast cyst excision Right 2011    There were no vitals filed for this visit.  Visit Diagnosis:  Lumbar pain with radiation down right leg  Joint stiffness of spine      Subjective Assessment - 06/13/15 1553    Subjective 7/10 sometimes feels like 8/10.  Had a busy weekend.  Standing was the hardest,  getting up from sitting still hurts.  No tingling into gluteals or legs   Currently in Pain? Yes   Pain Score 6    Pain Location Back   Pain Orientation Right;Left   Pain Descriptors / Indicators Aching   Pain Radiating Towards no radiating  pain since last visit.   Pain Frequency Constant   Aggravating Factors  getting up from sitting, long standing long sitting.     Pain Relieving Factors walking, not moving                         [facility] Adult PT Treatment/Exercise - 06/13/15 1546    Self-Care   Lifting practiced matn knee height , floor, golfer's lift, modifications for LT knee.  good technique.   Lumbar Exercises: Aerobic   Stationary Bike Nustep Level 5,  7 minutes   Lumbar Exercises: Supine   Glut Set 10 reps   Clam Limitations isometric clams painful so stopped.   Bent Knee Raise 5 reps  cues   Bridge 10 reps   Other Supine Lumbar Exercises hooklying ball squeeze,    Moist Heat Therapy   Number Minutes Moist Heat 15 Minutes   Moist Heat Location Lumbar Spine                PT Education - 06/13/15 1759    Education provided Yes   Education Details lifting practiced,    Person(s) Educated Patient   Methods Explanation;Demonstration;Verbal cues   Comprehension Verbalized understanding;Returned demonstration  PT Short Term Goals - 06/13/15 1609    PT SHORT TERM GOAL #1   Time 4   Period Weeks   Status Achieved   PT SHORT TERM GOAL #2   Title Pt will be able to stand from a sitting position without difficulty most of the time.    Baseline still hurts   Time 4   Period Weeks   Status On-going   PT SHORT TERM GOAL #3   Title Pt will report improvement in LE symptoms to minimal (pain, tingling)   Status Achieved           PT Long Term Goals - 06/13/15 1610    PT LONG TERM GOAL #1   Title Pt will be I with advanced HEP   Status On-going   PT LONG TERM GOAL #2   Title Pt will be able to demo safe lifting techniques and understand posture, body mech    Time 8   Period Weeks   PT LONG TERM GOAL #3   Title Pt will be able to sit for 45 min and report min discomfort in low back   Baseline 30 to 45 minutes   Time 8   Period Weeks   Status Partially Met   PT  LONG TERM GOAL #4   Title Pt will be able to walk for fitness for 30 min x >/=3 days per week, no increase in leg pain   Baseline 1 X a week   Time 8   Period Weeks   Status On-going   PT LONG TERM GOAL #5   Title Pt will be able to reach, squat and lift mod sized items without exacerbation of pain.    Baseline knee pain limits some   Time 8   Period Weeks   Status On-going   PT LONG TERM GOAL #6   Title Patient will be able to stand 90 min with pain no more than 3/10   Baseline 15 minutes reported today, painful   pain number varies.   Time 8   Period Weeks   Status On-going               Plan - 06/13/15 1800    Clinical Impression Statement Lifting goal met,  patient had fromerly met standing goals but today reports only being able to stand 15 minutes,  She is not walking regularly for exercise.  sitting then standing continue to be painful.   PT Next Visit Plan Finalize home exercises.   Consulted and Agree with Plan of Care Patient     POC 06/17/2015   Problem List Patient Active Problem List   Diagnosis Date Noted  . Irritation of oral cavity 03/01/2015  . Swelling of left lower extremity 03/01/2015  . Multiple nevi 03/01/2015  . Headache 01/25/2015  . H/O: hysterectomy 01/25/2015  . Orthostatic hypotension 11/18/2014  . Papulovesicular rash RLE 02/09/2014  . Carbon monoxide exposure 10/27/2013  . Dizziness, nonspecific 10/27/2013  . Fatigue 07/16/2013  . Sensorineural hearing loss of both ears 07/16/2013  . Cataract 06/11/2013  . Healthcare maintenance 06/11/2013  . Patellofemoral pain syndrome--R KNEE 03/17/2013  . Positive TB test   . Insomnia 06/05/2012  . Sinus headache 06/05/2012  . Back pain, acute on chronic 06/05/2012  . Nocturnal polyuria 06/05/2012  . COPD 03/17/2010  . Abdominal pain 08/16/2008  . CARPAL TUNNEL SYNDROME, BILATERAL 03/29/2008  . CONSTIPATION 01/28/2008  . Asthma 01/31/2007  . DEPRESSION, MAJOR, RECURRENT 11/14/2006  .  GASTROESOPHAGEAL  REFLUX, NO ESOPHAGITIS 11/14/2006  . Irritable bowel syndrome 11/14/2006    [place] 06/13/2015, 6:04 PM  [facility] [address] [place], [place], [address] Phone: [phone]   Fax:  [phone]     Melvenia Needles, PTA 06/13/2015 6:04 PM Phone: [phone] Fax: [phone]

## 2015-06-17 ENCOUNTER — Ambulatory Visit: Payer: No Typology Code available for payment source | Admitting: Physical Therapy

## 2015-06-17 DIAGNOSIS — M545 Low back pain, unspecified: Secondary | ICD-10-CM

## 2015-06-17 DIAGNOSIS — M2560 Stiffness of unspecified joint, not elsewhere classified: Secondary | ICD-10-CM

## 2015-06-17 DIAGNOSIS — R209 Unspecified disturbances of skin sensation: Secondary | ICD-10-CM

## 2015-06-17 DIAGNOSIS — M79604 Pain in right leg: Secondary | ICD-10-CM

## 2015-06-17 NOTE — Therapy (Signed)
[place], [place], [address] Phone: [phone]   Fax:  [phone]  Physical Therapy Treatment  Patient Details  Name: Aimee Davis MRN: [id number] Date of Birth: 1955-09-27 Referring Provider:  Liberty Handy, MD  Encounter Date: 06/17/2015      PT End of Session - 06/17/15 1156    Visit Number 20   Number of Visits 23   PT Start Time 1112   PT Stop Time 1154   PT Time Calculation (min) 42 min   Activity Tolerance Patient tolerated treatment well   Behavior During Therapy [facility] for tasks assessed/performed      Past Medical History  Diagnosis Date  . Allergy     seasonal, PCN, antidepressants, bentyl  . Asthma   . Carpal tunnel syndrome on both sides     right worse than left 2008  . Hyperlipidemia     LDL 108 JULY 2013  . Positive TB test     From MArch 2009 note : Recent F/u at [facility] CXRAy negative.  Positive TST in 2007 (77m).  Unable to tolerate INH due to hepatotoxicity  . COPD (chronic obstructive pulmonary disease)   . Anemia   . GERD (gastroesophageal reflux disease)   . Chronic lower back pain     Past Surgical History  Procedure Laterality Date  . Induced abortion  2005  . Vaginal hysterectomy  1983    partial; for abnormal bleeding  . Breast lumpectomy Right 1990's  . Breast cyst excision Right 2011    There were no vitals filed for this visit.  Visit Diagnosis:  Lumbar pain with radiation down right leg  Joint stiffness of spine  Sensory disturbance      Subjective Assessment - 06/17/15 1113    Subjective Back is OK now, mostly troubles her at night.     Currently in Pain? Yes   Pain Score 6    Pain Location Back   Pain Orientation Right   Pain Descriptors / Indicators Aching   Pain Type Chronic pain   Pain Radiating Towards to the Rt buttock   Pain Onset More than a month ago   Pain Frequency Intermittent            [facility] Adult PT Treatment/Exercise  - 06/17/15 1122    Lumbar Exercises: Supine   Clam 20 reps   Bent Knee Raise 10 reps   Bridge 10 reps   Other Supine Lumbar Exercises Rt. QL stretch with manual    Lumbar Exercises: Prone   Other Prone Lumbar Exercises prone press up for LE pain    Lumbar Exercises: Quadruped   Straight Leg Raise --   Opposite Arm/Leg Raise --  pulling LT   Moist Heat Therapy   Number Minutes Moist Heat 15 Minutes   Moist Heat Location Lumbar Spine   Manual Therapy   Manual Therapy Soft tissue mobilization;Myofascial release   Soft tissue mobilization Rt low back.  tissue softened, less pain.  prone over 2 pillows.   Rt. QL and lumbar paraspinals              PT Education - 06/17/15 1151    Education provided Yes   Education Details HEP, QL and self care   Person(s) Educated Patient   Methods Explanation   Comprehension Verbalized understanding          PT Short Term Goals - 06/17/15 1117    PT SHORT TERM GOAL #1  Title Pt will be I with initial HEP   Status Achieved   PT SHORT TERM GOAL #2   Title Pt will be able to stand from a sitting position without difficulty most of the time.    Status Achieved   PT SHORT TERM GOAL #3   Title Pt will report improvement in LE symptoms to minimal (pain, tingling)   Status Achieved           PT Long Term Goals - 06/17/15 1115    PT LONG TERM GOAL #1   Title Pt will be I with advanced HEP   Status Achieved   PT LONG TERM GOAL #2   Title Pt will be able to demo safe lifting techniques and understand posture, body mech    Status Achieved   PT LONG TERM GOAL #3   Title Pt will be able to sit for 45 min and report min discomfort in low back   Status Achieved   PT LONG TERM GOAL #4   Title Pt will be able to walk for fitness for 30 min x >/=3 days per week, no increase in leg pain   Status Achieved   PT LONG TERM GOAL #5   Title Pt will be able to reach, squat and lift mod sized items without exacerbation of pain.    Status Achieved    PT LONG TERM GOAL #6   Title Patient will be able to stand 90 min with pain no more than 3/10   Baseline cannot stand still very 15-30 min   Status Not Met               Plan - 06/17/15 1156    Clinical Impression Statement Patient has met all LTG , continues to have pain in Rt. low back which radiates to Rt. hip.  She is living with her mother right now and has less than favorable living conditions, stress that may contribute.     PT Next Visit [place] and Agree with Plan of Care Patient        Problem List Patient Active Problem List   Diagnosis Date Noted  . Irritation of oral cavity 03/01/2015  . Swelling of left lower extremity 03/01/2015  . Multiple nevi 03/01/2015  . Headache 01/25/2015  . H/O: hysterectomy 01/25/2015  . Orthostatic hypotension 11/18/2014  . Papulovesicular rash RLE 02/09/2014  . Carbon monoxide exposure 10/27/2013  . Dizziness, nonspecific 10/27/2013  . Fatigue 07/16/2013  . Sensorineural hearing loss of both ears 07/16/2013  . Cataract 06/11/2013  . Healthcare maintenance 06/11/2013  . Patellofemoral pain syndrome--R KNEE 03/17/2013  . Positive TB test   . Insomnia 06/05/2012  . Sinus headache 06/05/2012  . Back pain, acute on chronic 06/05/2012  . Nocturnal polyuria 06/05/2012  . COPD 03/17/2010  . Abdominal pain 08/16/2008  . CARPAL TUNNEL SYNDROME, BILATERAL 03/29/2008  . CONSTIPATION 01/28/2008  . Asthma 01/31/2007  . DEPRESSION, MAJOR, RECURRENT 11/14/2006  . GASTROESOPHAGEAL REFLUX, NO ESOPHAGITIS 11/14/2006  . Irritable bowel syndrome 11/14/2006    PAA,JENNIFER 06/17/2015, 11:59 AM  [place], [place], [address] Phone: [phone]   Fax:  [phone]  Raeford Razor, PT 06/17/2015 12:00 PM Phone: [phone] Fax: [phone]   PHYSICAL THERAPY DISCHARGE SUMMARY  Visits from Start of Care:  20  Current functional level related to goals / functional outcomes: See above for goals met   Remaining  deficits: Pain in Rt. QL and some spine stiffness, difficulty standing   Education / Equipment: HEP, posture, lifting  Plan: Patient agrees to discharge.  Patient goals were met. Patient is being discharged due to meeting the stated rehab goals.  ?????    Raeford Razor, PT 06/17/2015 12:01 PM Phone: [phone] Fax: [phone]

## 2015-06-17 NOTE — Patient Instructions (Addendum)
  Given papercopy from cabinet: childs pose, QL stretch

## 2015-08-23 ENCOUNTER — Other Ambulatory Visit: Payer: Self-pay | Admitting: Internal Medicine

## 2015-08-23 DIAGNOSIS — K589 Irritable bowel syndrome without diarrhea: Secondary | ICD-10-CM

## 2015-08-23 DIAGNOSIS — J4530 Mild persistent asthma, uncomplicated: Secondary | ICD-10-CM

## 2015-08-23 MED ORDER — HYOSCYAMINE SULFATE 0.125 MG SL SUBL: 0.1250 mg | SUBLINGUAL_TABLET | Freq: Every day | SUBLINGUAL | Status: DC | PRN

## 2015-08-23 MED ORDER — FLUTICASONE-SALMETEROL 250-50 MCG/DOSE IN AEPB: 1.0000 | INHALATION_SPRAY | Freq: Two times a day (BID) | RESPIRATORY_TRACT | Status: DC

## 2015-08-23 NOTE — Telephone Encounter (Signed)
Pt [facility]

## 2015-09-08 ENCOUNTER — Encounter: Payer: No Typology Code available for payment source | Admitting: Internal Medicine

## 2015-10-05 ENCOUNTER — Ambulatory Visit (INDEPENDENT_AMBULATORY_CARE_PROVIDER_SITE_OTHER): Payer: No Typology Code available for payment source | Admitting: Internal Medicine

## 2015-10-05 ENCOUNTER — Encounter: Payer: Self-pay | Admitting: Internal Medicine

## 2015-10-05 VITALS — BP 111/61 | HR 58 | Temp 98.4°F | Wt 158.1 lb

## 2015-10-05 DIAGNOSIS — K581 Irritable bowel syndrome with constipation: Secondary | ICD-10-CM

## 2015-10-05 MED ORDER — HYOSCYAMINE SULFATE 0.125 MG SL SUBL: 0.1250 mg | SUBLINGUAL_TABLET | Freq: Every day | SUBLINGUAL | Status: DC | PRN

## 2015-10-05 NOTE — Progress Notes (Signed)
[facility] Subjective:   Patient ID: Aimee Davis female   DOB: 02/14/1956 60 y.o.   MRN: [id number]  HPI: Aimee Davis is a 60 y.o. female with a PMH detailed below who presents for an acute visit for abdominal pain/ flair of IBS.  Please see problem based A&P below for further details of the status of her chronic medical problems.    Past Medical History  Diagnosis Date  . Allergy     seasonal, PCN, antidepressants, bentyl  . Asthma   . Carpal tunnel syndrome on both sides     right worse than left 2008  . Hyperlipidemia     LDL 108 JULY 2013  . Positive TB test     From MArch 2009 note : Recent F/u at [facility]. CXRAy negative.  Positive TST in 2007 (21mm).  Unable to tolerate INH due to hepatotoxicity  . COPD (chronic obstructive pulmonary disease) ([place])   . Anemia   . GERD (gastroesophageal reflux disease)   . Chronic lower back pain    Current Outpatient Prescriptions  Medication Sig Dispense Refill  . albuterol (PROVENTIL HFA;VENTOLIN HFA) 108 (90 BASE) MCG/ACT inhaler Inhale 2 puffs into the lungs every 6 (six) hours as needed for wheezing. 1 Inhaler 6  . Artificial Tear Ointment (DRY EYES OP) Apply 1 drop to eye daily as needed (dry eyes).    . Ascorbic Acid (VITAMIN C) 100 MG tablet Take 50 mg by mouth daily.    Marland Kitchen CALCIUM-VITAMIN D PO Take by mouth. Patient taking 2 in morning and 1 at night    . esomeprazole (NEXIUM) 40 MG capsule Take 1 capsule (40 mg total) by mouth daily before breakfast. 90 capsule 5  . fish oil-omega-3 fatty acids 1000 MG capsule Take 2 g by mouth daily.     . Flaxseed MISC Take by mouth See admin instructions. Teaspoonful of seed blended and mixed with yogurt    . Fluticasone-Salmeterol (ADVAIR DISKUS) 250-50 MCG/DOSE AEPB Inhale 1 puff into the lungs 2 (two) times daily. 60 each 3  . hyoscyamine (LEVSIN SL) 0.125 MG SL tablet Place 1 tablet (0.125 mg total) under the tongue daily as needed for cramping. 30 tablet 0   . loratadine (CLARITIN) 10 MG tablet Take 10 mg by mouth daily as needed for allergies.     . mometasone (NASONEX) 50 MCG/ACT nasal spray Place 2 sprays in each nostril once daily as needed 17 g 3  . Multiple Vitamin (MULTIVITAMIN) capsule Take 1 capsule by mouth daily.    . Probiotic Product (PROBIOTIC DAILY PO) Take by mouth.     No current facility-administered medications for this visit.   Family History  Problem Relation Age of Onset  . Hypertension Mother   . Hyperlipidemia Mother   . Heart disease Mother   . Diabetes Mother   . Kidney disease Mother   . Stroke Father   . Diabetes Father   . Heart disease Father   . Hyperlipidemia Father   . Hypertension Father   . Kidney disease Father   . Cancer Sister   . Mental illness Sister   . Diabetes Sister   . Kidney disease Sister   . Hypertension Sister   . Mental illness Brother   . Asthma Son   . Hypertension Maternal Aunt   . Hypertension Maternal Uncle   . Hypertension Paternal Aunt   . Kidney disease Paternal Aunt   . Hypertension Paternal Uncle   . Kidney  disease Paternal Uncle    Social History   Social History  . Marital Status: Divorced    Spouse Name: N/A  . Number of Children: N/A  . Years of Education: N/A   Social History Main Topics  . Smoking status: Former Smoker -- 0.33 packs/day for 43 years    Types: Cigarettes    Quit date: 01/06/2013  . Smokeless tobacco: Never Used  . Alcohol Use: No  . Drug Use: No  . Sexual Activity: Not Asked     Comment: S/P PARTIAL HYSTERECTOMY   Other Topics Concern  . None   Social History Narrative   Review of Systems: Review of Systems  Constitutional: Negative for fever and chills.  Respiratory: Negative for cough.   Gastrointestinal: Positive for nausea, abdominal pain and constipation. Negative for heartburn, vomiting, blood in stool and melena.  Genitourinary: Negative for dysuria.     Objective:  Physical Exam: Filed Vitals:   10/05/15 1435   BP: 111/61  Pulse: 58  Temp: 98.4 F (36.9 C)  TempSrc: Oral  Weight: 158 lb 1.6 oz (71.714 kg)  SpO2: 99%  Physical Exam  Constitutional: She is well-developed, well-nourished, and in no distress.  Cardiovascular: Normal rate and regular rhythm.   Abdominal: Soft. Bowel sounds are normal. She exhibits no distension. There is no rebound and no guarding.  Nursing note and vitals reviewed.   Assessment & Plan:  Case discussed with Dr. Eppie Gibson  Irritable bowel syndrome HPI: Patient reports flair in her IBS.  Over the past 3-4 days she has had more pain especially in the morning.  Sh is taking Levsin and having improvement with the medication and typically pain has mostly resolved by the afternoon.  She wants to know what else she may be able to do.  She cannot think of anything that may have triggered this latest exacerbation but on further recolection she reports she has not been taking Nexium for the last 2 weeks  A: IBS- constipation  P: - Advised to resume Nexium 40mg  BID x1 week then dialy - Continue Levsin - Provided information of IBS support groups - Provided information about dietary changes - Provided information about exercise, encouraged [facility].    Medications Ordered Meds ordered this encounter  Medications  . hyoscyamine (LEVSIN SL) 0.125 MG SL tablet    Sig: Place 1 tablet (0.125 mg total) under the tongue daily as needed for cramping.    Dispense:  30 tablet    Refill:  5   Other Orders No orders of the defined types were placed in this encounter.   Follow Up: Return if symptoms worsen or fail to improve.

## 2015-10-05 NOTE — Patient Instructions (Signed)
General Instructions: Please look into IBS support groups  Work on the diet and increasing exercise as we discussed.  Please bring your medicines with you each time you come to clinic.  Medicines may include prescription medications, over-the-counter medications, herbal remedies, eye drops, vitamins, or other pills.   Progress Toward Treatment Goals:  Treatment Goal 12/04/2012  Stop smoking smoking the same amount    Self Care Goals & Plans:  Self Care Goal 12/11/2012  Manage my medications take my medicines as prescribed; bring my medications to every visit; refill my medications on time  Be physically active take a walk every day  Stop smoking (No Data)    No flowsheet data found.   Care Management & Community Referrals:  Referral 03/31/2013  Referrals made for care management support -  Referrals made to community resources exercise/physical therapy

## 2015-10-07 NOTE — Assessment & Plan Note (Addendum)
HPI: Patient reports flair in her IBS.  Over the past 3-4 days she has had more pain especially in the morning.  Sh is taking Levsin and having improvement with the medication and typically pain has mostly resolved by the afternoon.  She wants to know what else she may be able to do.  She cannot think of anything that may have triggered this latest exacerbation but on further recolection she reports she has not been taking Nexium for the last 2 weeks  A: IBS- constipation  P: - Advised to resume Nexium 40mg  BID x1 week then dialy - Continue Levsin - Provided information of IBS support groups - Provided information about dietary changes - Provided information about exercise, encouraged [facility] to [facility].

## 2015-10-10 NOTE — Progress Notes (Signed)
Case discussed with Dr. Hoffman soon after the resident saw the patient.  We reviewed the resident's history and exam and pertinent patient test results.  I agree with the assessment, diagnosis, and plan of care documented in the resident's note. 

## 2015-10-10 NOTE — Addendum Note (Signed)
Addended by: Oval Linsey D on: 10/10/2015 03:28 PM   Modules accepted: Level of Service

## 2015-10-12 ENCOUNTER — Ambulatory Visit: Payer: No Typology Code available for payment source

## 2015-10-13 ENCOUNTER — Encounter: Payer: Self-pay | Admitting: Internal Medicine

## 2015-10-13 ENCOUNTER — Ambulatory Visit (INDEPENDENT_AMBULATORY_CARE_PROVIDER_SITE_OTHER): Payer: No Typology Code available for payment source | Admitting: Internal Medicine

## 2015-10-13 VITALS — BP 126/64 | HR 67 | Temp 97.7°F | Ht 63.5 in | Wt 158.2 lb

## 2015-10-13 DIAGNOSIS — M549 Dorsalgia, unspecified: Secondary | ICD-10-CM

## 2015-10-13 DIAGNOSIS — K581 Irritable bowel syndrome with constipation: Secondary | ICD-10-CM

## 2015-10-13 DIAGNOSIS — M545 Low back pain: Secondary | ICD-10-CM

## 2015-10-13 DIAGNOSIS — J4530 Mild persistent asthma, uncomplicated: Secondary | ICD-10-CM

## 2015-10-13 DIAGNOSIS — G8929 Other chronic pain: Secondary | ICD-10-CM

## 2015-10-13 DIAGNOSIS — J45909 Unspecified asthma, uncomplicated: Secondary | ICD-10-CM

## 2015-10-13 MED ORDER — ALBUTEROL SULFATE HFA 108 (90 BASE) MCG/ACT IN AERS: 2.0000 | INHALATION_SPRAY | Freq: Four times a day (QID) | RESPIRATORY_TRACT | Status: DC | PRN

## 2015-10-13 MED ORDER — HYOSCYAMINE SULFATE 0.125 MG SL SUBL: 0.1250 mg | SUBLINGUAL_TABLET | Freq: Every day | SUBLINGUAL | Status: DC | PRN

## 2015-10-13 MED ORDER — MELOXICAM 7.5 MG PO TABS: 7.5000 mg | ORAL_TABLET | Freq: Every day | ORAL | Status: DC

## 2015-10-13 MED FILL — MELOXICAM 7.5 MG TABLET: 7.5 | 30 days supply | Qty: 30 | Fill #0

## 2015-10-13 NOTE — Patient Instructions (Signed)
Aimee Davis, it was a pleasure seeing you today.   For your asthma, if the advair is not working, you can take two puffs of albuterol. We may need additional testing in the future to see if you really have asthma.  For your IBS, please continue with the current therapy. However, please stop taking calcium. Consider taking colace or another stool softener for your constipation.  For your back pain. I am prescribing meloxicam. Take the prescription to a pharmacy to get it filled. Take it once a day for no more than 7 days at a time. Consider picking up a ALEVE DIRECT THERAPY TENS device at a pharmacy.

## 2015-10-13 NOTE — Progress Notes (Signed)
Subjective:    Patient ID: Aimee Davis, female    DOB: 1956-02-21, 60 y.o.   MRN: [id number]  HPI  Aimee Davis is 60 year old woman with a PMH as below who comes to the clinic to discuss her IBS, lower back pain, and asthma.  With respect to he IBS, she indicates it is related nearly all to constipation. Every morning, at around 5:30am, she will experience diffuse cramping that is relieved somewhat with hyoscyamine. She reports three hard stools a week. She denies any blood in her stool. As noted, previous workup by gastroenterology at [facility] was unremarkable, leading to IBS diagnosis.  She endorses some lower back pain that worsens throughout the day. She does not take ibuprofen for it due to stomach cramping. She felt that physical therapy and exercise have also been helpful, but have not entirely resolved her symptoms. She denies that the pain radiates down the back of her legs.   The patient has been adherent with her Advair 250-50 inhaler, but still reports dyspnea around twice a week, worsening with cold weather and exercise. She has an albuterol inhaler, but she does not take it at those time, and is wondering if she should use it at all.   Active Ambulatory Problems    Diagnosis Date Noted  . DEPRESSION, MAJOR, RECURRENT 11/14/2006  . CARPAL TUNNEL SYNDROME, BILATERAL 03/29/2008  . Asthma 01/31/2007  . COPD 03/17/2010  . GASTROESOPHAGEAL REFLUX, NO ESOPHAGITIS 11/14/2006  . CONSTIPATION 01/28/2008  . Irritable bowel syndrome 11/14/2006  . Abdominal pain 08/16/2008  . Insomnia 06/05/2012  . Sinus headache 06/05/2012  . Back pain, acute on chronic 06/05/2012  . Nocturnal polyuria 06/05/2012  . Positive TB test   . Patellofemoral pain syndrome--R KNEE 03/17/2013  . Cataract 06/11/2013  . Healthcare maintenance 06/11/2013  . Fatigue 07/16/2013  . Sensorineural hearing loss of both ears 07/16/2013  . Carbon monoxide exposure 10/27/2013  . Dizziness, nonspecific  10/27/2013  . Papulovesicular rash RLE 02/09/2014  . Orthostatic hypotension 11/18/2014  . Headache 01/25/2015  . H/O: hysterectomy 01/25/2015  . Irritation of oral cavity 03/01/2015  . Swelling of left lower extremity 03/01/2015  . Multiple nevi 03/01/2015   Resolved Ambulatory Problems    Diagnosis Date Noted  . LEUKOCYTOSIS 11/11/2008  . ANXIETY 11/14/2006  . TOBACCO DEPENDENCE 11/14/2006  . ATTENTION DEFICIT, W/HYPERACTIVITY 11/14/2006  . CATARACTS 08/16/2008  . RHINITIS, ALLERGIC 11/14/2006  . CHRONIC KIDNEY DISEASE STAGE 2 11/14/2006  . BACTERIAL VAGINITIS 08/04/2009  . Dyspareunia 11/14/2006  . INCONTINENCE, STRESS, FEMALE 11/14/2006  . INFECTION, SKIN AND SOFT TISSUE 04/23/2008  . KNEE PAIN, LEFT 01/31/2007  . TENNIS ELBOW 11/14/2006  . FATIGUE 04/07/2007  . WEIGHT LOSS 01/28/2009  . Cough 01/28/2009  . URINARY URGENCY, MILD 04/28/2010  . TB TEST, POSITIVE 11/14/2006  . UPPER RESPIRATORY INFECTION 11/01/2010  . UTI (urinary tract infection) 12/04/2012   Past Medical History  Diagnosis Date  . Allergy   . Carpal tunnel syndrome on both sides   . Hyperlipidemia   . COPD (chronic obstructive pulmonary disease) ([place])   . Anemia   . GERD (gastroesophageal reflux disease)   . Chronic lower back pain      Review of Systems  Constitutional: Negative for fever and fatigue.  Respiratory: Negative for cough and shortness of breath.   Cardiovascular: Negative for chest pain.  Gastrointestinal: Positive for abdominal pain and constipation. Negative for diarrhea.  Musculoskeletal: Positive for back pain.  Objective:   Physical Exam  Constitutional: She appears well-developed and well-nourished. No distress.  HENT:  Mouth/Throat: Oropharynx is clear and moist. No oropharyngeal exudate.  Eyes: No scleral icterus.  Cardiovascular: Normal rate, regular rhythm and normal heart sounds.   Pulmonary/Chest: Effort normal and breath sounds normal. No respiratory  distress. She has no wheezes.  Abdominal: Soft. Bowel sounds are normal. She exhibits no distension.  Mild discomfort to palpation.  Musculoskeletal:  Paraspinal back tenderness. Straight leg raise negative.  Psychiatric: She has a normal mood and affect. Her behavior is normal.  Vitals reviewed.         Assessment & Plan:   Please see problem-based assessment and plan for details.

## 2015-10-16 NOTE — Assessment & Plan Note (Signed)
A: Symptoms consistent to IBS-C. I looked into mebeverine as an alternative to hyoscyamine because of its reduced tendency to cause constipation, but unfortunately I found out it is not available in the [place]. I will continue to investigate alternative therapies. In the meantime, we will discontinue calcium supplements as this can worsen constipation P: Continue hyoscyamine, d/c calcium

## 2015-10-16 NOTE — Assessment & Plan Note (Signed)
A: Asthma symptoms have improved since her last visit. It is possible that she may need repeat PFTs and to wean her off of ICS/LABA to ascertain whether she still has asthma. Will continue current therapy for now.  P:  - Said she could use the albuterol inhaler as needed - Consider repeat PFTs

## 2015-10-16 NOTE — Assessment & Plan Note (Signed)
A: Consistent with chronic lower back pain. Overall, symptoms are improved since last visit. Meloxicam has a reduced tendency to induce gastric symptoms, but unfortunately it is not carried by the health department. I will provide her a written prescription. She is also agreeable to picking up a TENS device, which she said was helpful during physical therapy. I informed her that these devices would cost $50 at the pharmacy and the evidence isn't solid, but she said she would like to try it anyway.  P: Meloxicam 7.5 mg daily for a 7-day course - Try TENS device - Aleve Direct Therapy - Encourage exercises and swimming at the [employer]

## 2015-10-17 NOTE — Progress Notes (Signed)
Internal Medicine Clinic Attending  Case discussed with Dr. Ford at the time of the visit.  We reviewed the resident's history and exam and pertinent patient test results.  I agree with the assessment, diagnosis, and plan of care documented in the resident's note.  

## 2015-12-22 ENCOUNTER — Other Ambulatory Visit: Payer: Self-pay

## 2015-12-22 DIAGNOSIS — Z1231 Encounter for screening mammogram for malignant neoplasm of breast: Secondary | ICD-10-CM

## 2015-12-23 ENCOUNTER — Other Ambulatory Visit: Payer: Self-pay

## 2015-12-23 DIAGNOSIS — J4530 Mild persistent asthma, uncomplicated: Secondary | ICD-10-CM

## 2015-12-24 MED ORDER — FLUTICASONE-SALMETEROL 250-50 MCG/DOSE IN AEPB: 1.0000 | INHALATION_SPRAY | Freq: Two times a day (BID) | RESPIRATORY_TRACT | Status: DC

## 2015-12-28 ENCOUNTER — Encounter: Payer: Self-pay | Admitting: Internal Medicine

## 2015-12-28 ENCOUNTER — Ambulatory Visit (INDEPENDENT_AMBULATORY_CARE_PROVIDER_SITE_OTHER): Payer: Self-pay | Admitting: Internal Medicine

## 2015-12-28 VITALS — BP 112/61 | HR 65 | Temp 97.7°F | Ht 63.0 in | Wt 159.2 lb

## 2015-12-28 DIAGNOSIS — R5382 Chronic fatigue, unspecified: Secondary | ICD-10-CM

## 2015-12-28 LAB — POCT GLYCOSYLATED HEMOGLOBIN (HGB A1C): Hemoglobin A1C: 5.3

## 2015-12-28 LAB — GLUCOSE, CAPILLARY: GLUCOSE-CAPILLARY: 65 mg/dL (ref 65–99)

## 2015-12-28 NOTE — Progress Notes (Signed)
Patient ID: LAKEETA FOLKS, female   DOB: Jan 07, 1956, 60 y.o.   MRN: [id number]     Subjective:   Patient ID: DEZTINEE AIKEN female    DOB: 20-Feb-1956 60 y.o.    MRN: [id number] Health Maintenance Due: Health Maintenance Due  Topic Date Due  . Hepatitis C Screening  03-Apr-1956  . ZOSTAVAX  09/07/2014    _________________________________________________  HPI: Ms.Shakinah C Ames is a 60 y.o. female here for acute visit for extreme fatigue.  Pt has a PMH outlined below.  Please see problem-based charting assessment and plan for further status of patient's chronic medical problems addressed at today's visit.  PMH: Past Medical History  Diagnosis Date  . Allergy     seasonal, PCN, antidepressants, bentyl  . Asthma   . Carpal tunnel syndrome on both sides     right worse than left 2008  . Hyperlipidemia     LDL 108 JULY 2013  . Positive TB test     From MArch 2009 note : Recent F/u at [facility]. CXRAy negative.  Positive TST in 2007 (103mm).  Unable to tolerate INH due to hepatotoxicity  . COPD (chronic obstructive pulmonary disease) ([place])   . Anemia   . GERD (gastroesophageal reflux disease)   . Chronic lower back pain     Medications: Current Outpatient Prescriptions on File Prior to Visit  Medication Sig Dispense Refill  . albuterol (PROVENTIL HFA;VENTOLIN HFA) 108 (90 Base) MCG/ACT inhaler Inhale 2 puffs into the lungs every 6 (six) hours as needed for wheezing. 1 Inhaler 6  . Artificial Tear Ointment (DRY EYES OP) Apply 1 drop to eye daily as needed (dry eyes).    . Ascorbic Acid (VITAMIN C) 100 MG tablet Take 50 mg by mouth daily.    Marland Kitchen esomeprazole (NEXIUM) 40 MG capsule Take 1 capsule (40 mg total) by mouth daily before breakfast. 90 capsule 5  . fish oil-omega-3 fatty acids 1000 MG capsule Take 2 g by mouth daily.     . Flaxseed MISC Take by mouth See admin instructions. Teaspoonful of seed blended and mixed with yogurt    . Fluticasone-Salmeterol (ADVAIR DISKUS) 250-50  MCG/DOSE AEPB Inhale 1 puff into the lungs 2 (two) times daily. 60 each 3  . hyoscyamine (LEVSIN SL) 0.125 MG SL tablet Place 1 tablet (0.125 mg total) under the tongue daily as needed for cramping. 30 tablet 5  . loratadine (CLARITIN) 10 MG tablet Take 10 mg by mouth daily as needed for allergies.     . meloxicam (MOBIC) 7.5 MG tablet Take 1 tablet (7.5 mg total) by mouth daily. Only take for 7 days at a time. 30 tablet 0  . mometasone (NASONEX) 50 MCG/ACT nasal spray Place 2 sprays in each nostril once daily as needed 17 g 3  . Multiple Vitamin (MULTIVITAMIN) capsule Take 1 capsule by mouth daily.    . Probiotic Product (PROBIOTIC DAILY PO) Take by mouth.     No current facility-administered medications on file prior to visit.    Allergies: Allergies  Allergen Reactions  . Penicillins Other (See Comments)    REACTION: rash    FH: Family History  Problem Relation Age of Onset  . Hypertension Mother   . Hyperlipidemia Mother   . Heart disease Mother   . Diabetes Mother   . Kidney disease Mother   . Stroke Father   . Diabetes Father   . Heart disease Father   . Hyperlipidemia Father   . Hypertension Father   .  Kidney disease Father   . Cancer Sister   . Mental illness Sister   . Diabetes Sister   . Kidney disease Sister   . Hypertension Sister   . Mental illness Brother   . Asthma Son   . Hypertension Maternal Aunt   . Hypertension Maternal Uncle   . Hypertension Paternal Aunt   . Kidney disease Paternal Aunt   . Hypertension Paternal Uncle   . Kidney disease Paternal Uncle     SH: Social History   Social History  . Marital Status: Divorced    Spouse Name: N/A  . Number of Children: N/A  . Years of Education: N/A   Social History Main Topics  . Smoking status: Former Smoker -- 0.33 packs/day for 43 years    Types: Cigarettes    Quit date: 01/06/2013  . Smokeless tobacco: Never Used  . Alcohol Use: No  . Drug Use: No  . Sexual Activity: Not Asked      Comment: S/P PARTIAL HYSTERECTOMY   Other Topics Concern  . None   Social History Narrative    Review of Systems: Constitutional: Negative for fever, chills, weight changes.  Respiratory: Negative for cough and shortness of breath.  Cardiovascular: Negative for chest pain.  Gastrointestinal: Negative for nausea, vomiting, diarrhea, +constipation.  Neurological: Negative for dizziness.   Objective:   Vital Signs: Filed Vitals:   12/28/15 1537  BP: 112/61  Pulse: 65  Temp: 97.7 F (36.5 C)  TempSrc: Oral  Height: 5\' 3"  (1.6 m)  Weight: 159 lb 3.2 oz (72.213 kg)  SpO2: 100%      BP Readings from Last 3 Encounters:  12/28/15 112/61  10/13/15 126/64  10/05/15 111/61    Physical Exam: Constitutional: Vital signs reviewed.  Patient is in NAD and cooperative with exam.  Head: Normocephalic and atraumatic. Eyes: EOMI, conjunctivae nl, no scleral icterus.  Neck: Supple. Cardiovascular: RRR, no MRG. Pulmonary/Chest: normal effort, CTAB, no wheezes, rales, or rhonchi. Abdominal: Soft. NT/ND +BS. Neurological: A&O x3, cranial nerves II-XII are grossly intact, moving all extremities. Extremities: NoLE edema. Skin: Warm, dry and intact. No rash.   Assessment & Plan:   Assessment and plan was discussed and formulated with my attending.

## 2015-12-28 NOTE — Assessment & Plan Note (Addendum)
Pt reports fatigue for several months.  She reports feeling fatigued "all the time."  She does not have a job and lives alone.  Her appetite is good, sleeping OK, denies pain, no changes in weight, palpitations, skin changes, anxiety or depression.  She has a history of IBS -constipation type and does not have a BM daily but uses metamucil.  Pt reports stress in her life but is unable to state what that is.  She denies difficulty with relationships, finances, etc.  She states she doesn't have a lot of people in her life and lives alone.  She has been on depression medication previously but states she doesn't want to go on any meds and does not feel she is depressed.  No FH of thyroid problems.  There is a FH of breast cancer but her mammogram last year was normal.  From chart review, it appears she has experienced fatigue in the past with normal labs.  She is open to discussion with a counselor.   -will check CMP, cbc/diff, HA1c -referral to social work  -referral back to GI for IBS    Of note, her cbg was slightly low today at 29.

## 2015-12-28 NOTE — Patient Instructions (Signed)
Thank you for your visit today.   Please return to the internal medicine clinic in about 1 month(s) or sooner if needed.   We will check your labs today to see if we can figure out anything that may be causing your fatigue.    Please be sure to bring all of your medications with you to every visit; this includes herbal supplements, vitamins, eye drops, and any over-the-counter medications.   Should you have any questions regarding your medications and/or any new or worsening symptoms, please be sure to call the clinic at [phone].   If you believe that you are suffering from a life threatening condition or one that may result in the loss of limb or function, then you should call 911 and proceed to the nearest Emergency Department.   A healthy lifestyle and preventative care can promote health and wellness.   Maintain regular health, dental, and eye exams.  Eat a healthy diet. Foods like vegetables, fruits, whole grains, low-fat dairy products, and lean protein foods contain the nutrients you need without too many calories. Decrease your intake of foods high in solid fats, added sugars, and salt. Get information about a proper diet from your caregiver, if necessary.  Regular physical exercise is one of the most important things you can do for your health. Most adults should get at least 150 minutes of moderate-intensity exercise (any activity that increases your heart rate and causes you to sweat) each week. In addition, most adults need muscle-strengthening exercises on 2 or more days a week.   Maintain a healthy weight. The body mass index (BMI) is a screening tool to identify possible weight problems. It provides an estimate of body fat based on height and weight. Your caregiver can help determine your BMI, and can help you achieve or maintain a healthy weight. For adults 20 years and older:  A BMI below 18.5 is considered underweight.  A BMI of 18.5 to 24.9 is normal.  A BMI of 25  to 29.9 is considered overweight.  A BMI of 30 and above is considered obese.

## 2015-12-29 LAB — CMP14 + ANION GAP
A/G RATIO: 2.2 (ref 1.2–2.2)
ALK PHOS: 66 IU/L (ref 39–117)
ALT: 17 IU/L (ref 0–32)
AST: 21 IU/L (ref 0–40)
Albumin: 4.4 g/dL (ref 3.6–4.8)
Anion Gap: 13 mmol/L (ref 10.0–18.0)
BILIRUBIN TOTAL: 0.3 mg/dL (ref 0.0–1.2)
BUN/Creatinine Ratio: 14 (ref 12–28)
BUN: 13 mg/dL (ref 8–27)
CHLORIDE: 104 mmol/L (ref 96–106)
CO2: 26 mmol/L (ref 18–29)
Calcium: 10.1 mg/dL (ref 8.7–10.3)
Creatinine, Ser: 0.92 mg/dL (ref 0.57–1.00)
GFR calc Af Amer: 78 mL/min/{1.73_m2} (ref >59)
GFR calc non Af Amer: 67 mL/min/{1.73_m2} (ref >59)
GLOBULIN, TOTAL: 2 g/dL (ref 1.5–4.5)
GLUCOSE: 71 mg/dL (ref 65–99)
POTASSIUM: 4.7 mmol/L (ref 3.5–5.2)
SODIUM: 143 mmol/L (ref 134–144)
Total Protein: 6.4 g/dL (ref 6.0–8.5)

## 2015-12-29 LAB — CBC WITH DIFFERENTIAL/PLATELET
Basophils Absolute: 0 10*3/uL (ref 0.0–0.2)
Basos: 0 %
EOS (ABSOLUTE): 0 10*3/uL (ref 0.0–0.4)
Eos: 1 %
HEMATOCRIT: 38.9 % (ref 34.0–46.6)
Hemoglobin: 12.8 g/dL (ref 11.1–15.9)
IMMATURE GRANULOCYTES: 0 %
Immature Grans (Abs): 0 10*3/uL (ref 0.0–0.1)
LYMPHS ABS: 0.8 10*3/uL (ref 0.7–3.1)
Lymphs: 29 %
MCH: 26.7 pg (ref 26.6–33.0)
MCHC: 32.9 g/dL (ref 31.5–35.7)
MCV: 81 fL (ref 79–97)
MONOS ABS: 0.2 10*3/uL (ref 0.1–0.9)
Monocytes: 7 %
NEUTROS PCT: 63 %
Neutrophils Absolute: 1.8 10*3/uL (ref 1.4–7.0)
PLATELETS: 237 10*3/uL (ref 150–379)
RBC: 4.8 x10E6/uL (ref 3.77–5.28)
RDW: 13.3 % (ref 12.3–15.4)
WBC: 2.9 10*3/uL — AB (ref 3.4–10.8)

## 2015-12-29 LAB — VITAMIN D 25 HYDROXY (VIT D DEFICIENCY, FRACTURES): Vit D, 25-Hydroxy: 21.9 ng/mL — ABNORMAL LOW (ref 30.0–100.0)

## 2015-12-29 LAB — TSH: TSH: 1 u[IU]/mL (ref 0.450–4.500)

## 2016-01-04 ENCOUNTER — Other Ambulatory Visit: Payer: Self-pay | Admitting: Internal Medicine

## 2016-01-04 ENCOUNTER — Telehealth: Payer: Self-pay | Admitting: Licensed Clinical Social Worker

## 2016-01-04 ENCOUNTER — Encounter: Payer: Self-pay | Admitting: Internal Medicine

## 2016-01-04 DIAGNOSIS — E559 Vitamin D deficiency, unspecified: Secondary | ICD-10-CM | POA: Insufficient documentation

## 2016-01-04 MED ORDER — ERGOCALCIFEROL 1.25 MG (50000 UT) PO CAPS: 50000.0000 [IU] | ORAL_CAPSULE | ORAL | Status: DC

## 2016-01-04 NOTE — Telephone Encounter (Signed)
Aimee Davis was referred to CSW as pt voiced interest in establishing with a counselor/therapist.  Pt is current with her [facility] card.  CSW placed call to Aimee Davis to discuss resources and agencies available through her [facility] card.  Pt was hesitant but states "I think I'm going to do it.".  After reviewing agencies and location with Aimee Davis, pt chose [facility].  CSW provided pt with contact information, address and walk-in hours to establish care.  Pt requesting this worker to mail information as well.  CSW confirmed pt's address.  Letter mailed.

## 2016-01-04 NOTE — Telephone Encounter (Signed)
CSW received call from [name].  Pt inquiring about the reason for the referral for counseling services.  CSW informed [name], physician placed referral based on chronic fatigue and possible depression and decline for pharmacotherapy.  CSW discussed the benefits of therapy for chronic fatigue.  Pt denies chronic fatigue and states she would prefer to start exercising to improve her low energy or take natural supplements.  Pt discussed her reasoning for declining behavioral health medication is based on a bad side effect several years ago.  Ms Aimee Davis is supported well by her church and would utilize her spiritual support system if she needed to talk about any issues.  [name] states she scheduled the appointment to rule out medical issues that could be causing her lack of energy and would like to know the results of her labs.  CSW informed [name], request will be sent to Dr. Gordy Davis to notify patient when results are available.

## 2016-01-05 ENCOUNTER — Encounter: Payer: Self-pay | Admitting: Internal Medicine

## 2016-01-09 ENCOUNTER — Ambulatory Visit
Admission: RE | Admit: 2016-01-09 | Discharge: 2016-01-09 | Disposition: A | Discharge status: Home / Self Care | Payer: No Typology Code available for payment source | Source: Ambulatory Visit

## 2016-01-09 ENCOUNTER — Telehealth: Payer: Self-pay | Admitting: Licensed Clinical Social Worker

## 2016-01-09 DIAGNOSIS — Z1231 Encounter for screening mammogram for malignant neoplasm of breast: Secondary | ICD-10-CM

## 2016-01-09 NOTE — Telephone Encounter (Signed)
Per pt's request, pt has been scheduled with her assigned PCP.  CSW placed call to Ms. Cranston.  Pt notified and aware letter placed in mail as additional reminder.  Ms. Hochstetler thanked this worker and looking forward to appointment.

## 2016-01-11 NOTE — Progress Notes (Signed)
Internal Medicine Clinic Attending  Case discussed with Dr. Gill soon after the resident saw the patient.  We reviewed the resident's history and exam and pertinent patient test results.  I agree with the assessment, diagnosis, and plan of care documented in the resident's note.  

## 2016-01-11 NOTE — Addendum Note (Signed)
Addended by: Gilles Chiquito B on: 01/11/2016 04:39 PM   Modules accepted: Level of Service

## 2016-01-23 ENCOUNTER — Telehealth: Payer: Self-pay | Admitting: Internal Medicine

## 2016-01-23 NOTE — Telephone Encounter (Signed)
APT. REMINDER CALL, LMTCB °

## 2016-01-24 ENCOUNTER — Ambulatory Visit: Payer: Self-pay | Admitting: Internal Medicine

## 2016-01-27 ENCOUNTER — Ambulatory Visit: Payer: Self-pay | Admitting: Internal Medicine

## 2016-01-27 ENCOUNTER — Encounter: Payer: Self-pay | Admitting: Internal Medicine

## 2016-02-10 ENCOUNTER — Ambulatory Visit: Payer: No Typology Code available for payment source | Admitting: Internal Medicine

## 2016-02-16 ENCOUNTER — Ambulatory Visit (INDEPENDENT_AMBULATORY_CARE_PROVIDER_SITE_OTHER): Payer: No Typology Code available for payment source | Admitting: Internal Medicine

## 2016-02-16 ENCOUNTER — Encounter: Payer: Self-pay | Admitting: Internal Medicine

## 2016-02-16 VITALS — BP 110/64 | HR 84 | Temp 98.1°F | Ht 63.5 in | Wt 159.8 lb

## 2016-02-16 DIAGNOSIS — K589 Irritable bowel syndrome without diarrhea: Secondary | ICD-10-CM

## 2016-02-16 DIAGNOSIS — M222X1 Patellofemoral disorders, right knee: Secondary | ICD-10-CM

## 2016-02-16 DIAGNOSIS — J4530 Mild persistent asthma, uncomplicated: Secondary | ICD-10-CM

## 2016-02-16 DIAGNOSIS — R51 Headache: Secondary | ICD-10-CM

## 2016-02-16 DIAGNOSIS — J45909 Unspecified asthma, uncomplicated: Secondary | ICD-10-CM

## 2016-02-16 DIAGNOSIS — K581 Irritable bowel syndrome with constipation: Secondary | ICD-10-CM

## 2016-02-16 DIAGNOSIS — R519 Headache, unspecified: Secondary | ICD-10-CM

## 2016-02-16 MED ORDER — HYOSCYAMINE SULFATE 0.125 MG SL SUBL: 0.1250 mg | SUBLINGUAL_TABLET | Freq: Every day | SUBLINGUAL | Status: DC | PRN

## 2016-02-16 MED ORDER — DICLOFENAC SODIUM 1 % TD GEL: 4.0000 g | Freq: Four times a day (QID) | TRANSDERMAL | Status: DC

## 2016-02-16 MED ORDER — FLUTICASONE-SALMETEROL 250-50 MCG/DOSE IN AEPB: 1.0000 | INHALATION_SPRAY | Freq: Two times a day (BID) | RESPIRATORY_TRACT | Status: DC

## 2016-02-16 MED ORDER — CETIRIZINE HCL 10 MG PO TABS: 10.0000 mg | ORAL_TABLET | Freq: Every day | ORAL | Status: DC

## 2016-02-16 NOTE — Patient Instructions (Signed)
Aimee Davis,  It was an honor and privilege to take care of you.  For your right knee and back pain, I am prescribing Voltaren gel. I will give it to you as a printout with a coupon to [facility].  Instead of Claritin for your allergies, we will try Zyrtec (cetirizine). You can also use the Nasonex every day whether you have symptoms or not.  For your asthma, I am refilling your Advair. It is possible to decrease this dose in the future or change to a less intense inhaler to see if you need it any more. However, since it's allergy season, we're going to hold off.  For your stomach problems, I am refilling your hyosciamine.   We'll see you in three months to meet your new doctor.

## 2016-02-16 NOTE — Assessment & Plan Note (Signed)
A: Patient did not tolerate oral NSAIDs.  P: Voltaren gel

## 2016-02-16 NOTE — Assessment & Plan Note (Signed)
A: Symptoms uncontrolled.  P: Switch from loratadine to cetirizine Nasonex

## 2016-02-16 NOTE — Assessment & Plan Note (Signed)
A: Patient symptoms are well controlled. The next provider can consider stepping down her inhaler regimen.  P: Refill Advair 250/50

## 2016-02-16 NOTE — Assessment & Plan Note (Signed)
A: Patient symptoms are improved with hyoscyamine  P: Refill hyoscyamine

## 2016-02-16 NOTE — Progress Notes (Signed)
   Subjective:    Patient ID: Aimee Davis, female    DOB: 01-21-1956, 59 y.o.   MRN: [id number]  HPI  Aimee Davis is a 60 year old woman with a PMH as below who comes to the clinic to discuss her chronic medical problems as detailed below.  Sinus Pain: Patient reports history of sinus headaches that she says are brought on by her allergies. After the recent rains, she reports itchy eyes and sinus pressure. She takes loratadine 10 mg daily which she says has been ineffective. She takes it every day whether she has symptoms or not.  Asthma: Patient has been adherent to Advair 1 puff BID. She has not needed her rescue inhaler at all. She reports  It is well controlled. She says she has had asthma "for years."  Irritable Bowel Syndrome: Patient describes waking up in the morning with abdominal cramping and pain. She also has constipation at times that can last 2-3 days. She feels the hyoscyamine she's prescribed improves this abdominal cramping.  Right Knee Pain: Patient has longstanding right knee pain. She has known degenerative changes in the right knee on MRI. She has taken the meloxicam once, and she "was awake for a couple days."     Past Medical History    Diagnosis Date Noted  . CARPAL TUNNEL SYNDROME, BILATERAL 03/29/2008  . Asthma 01/31/2007  . Irritable bowel syndrome 11/14/2006  . Sinus headache 06/05/2012  . Positive TB test   . Patellofemoral pain syndrome--R KNEE 03/17/2013  . Cataract 06/11/2013  . Healthcare maintenance 06/11/2013  . H/O: hysterectomy 01/25/2015  . Multiple nevi 03/01/2015    Review of Systems  Constitutional: Negative for fever and fatigue.  HENT: Positive for rhinorrhea. Negative for sore throat.   Eyes: Positive for redness and itching.  Respiratory: Negative for cough and shortness of breath.   Cardiovascular: Negative for chest pain and palpitations.  Gastrointestinal: Positive for abdominal pain and constipation.  Endocrine: Negative for  cold intolerance and heat intolerance.  Genitourinary: Negative for urgency and frequency.  Musculoskeletal: Positive for back pain and arthralgias.  Skin: Negative for rash and wound.  Neurological: Negative for dizziness and headaches.  Psychiatric/Behavioral: Negative for dysphoric mood. The patient is not nervous/anxious.        Objective:   Physical Exam  Constitutional: No distress.  HENT:  Mouth/Throat: Oropharynx is clear and moist. No oropharyngeal exudate.  Eyes: Conjunctivae are normal. No scleral icterus.  Neck: Neck supple.  Cardiovascular: Normal rate, regular rhythm and normal heart sounds.   Pulmonary/Chest: Effort normal and breath sounds normal. No respiratory distress. She has no wheezes.  Abdominal: Soft. Bowel sounds are normal. She exhibits no distension. There is no tenderness.  Musculoskeletal:  Pain in right knee reproduced on leg extension. No swelling.  Lymphadenopathy:    She has no cervical adenopathy.  Neurological: She is alert. She has normal reflexes.  Skin: Skin is warm and dry. She is not diaphoretic.  Psychiatric: She has a normal mood and affect. Her behavior is normal.  Vitals reviewed.         Assessment & Plan:   Please see problem based assessment and plan for details.

## 2016-02-17 NOTE — Progress Notes (Signed)
Internal Medicine Clinic Attending  Case discussed with Dr. Ford at the time of the visit.  We reviewed the resident's history and exam and pertinent patient test results.  I agree with the assessment, diagnosis, and plan of care documented in the resident's note.  

## 2016-03-14 ENCOUNTER — Encounter: Payer: Self-pay | Admitting: *Deleted

## 2016-03-28 ENCOUNTER — Telehealth: Payer: Self-pay

## 2016-03-28 NOTE — Telephone Encounter (Signed)
Dr Maudie Mercury, do you think you might be able to help with this?

## 2016-03-28 NOTE — Telephone Encounter (Signed)
Pt states she lost the Rx for Voltaren gel and the coupon. Please call pt back.

## 2016-03-29 NOTE — Telephone Encounter (Signed)
Tried calling patient yesterday and today and left message x 2, no response. Patient can try from [facility] for $10.

## 2016-05-30 ENCOUNTER — Ambulatory Visit: Payer: No Typology Code available for payment source

## 2016-06-04 ENCOUNTER — Ambulatory Visit: Payer: No Typology Code available for payment source

## 2016-06-06 ENCOUNTER — Ambulatory Visit: Payer: No Typology Code available for payment source

## 2016-06-18 ENCOUNTER — Ambulatory Visit (INDEPENDENT_AMBULATORY_CARE_PROVIDER_SITE_OTHER): Payer: No Typology Code available for payment source | Admitting: Internal Medicine

## 2016-06-18 ENCOUNTER — Encounter: Payer: Self-pay | Admitting: Internal Medicine

## 2016-06-18 VITALS — BP 122/66 | HR 77 | Temp 98.0°F | Ht 63.5 in | Wt 161.0 lb

## 2016-06-18 DIAGNOSIS — J4540 Moderate persistent asthma, uncomplicated: Secondary | ICD-10-CM

## 2016-06-18 DIAGNOSIS — U071 COVID-19: Secondary | ICD-10-CM

## 2016-06-18 DIAGNOSIS — J069 Acute upper respiratory infection, unspecified: Secondary | ICD-10-CM

## 2016-06-18 DIAGNOSIS — Z87891 Personal history of nicotine dependence: Secondary | ICD-10-CM

## 2016-06-18 DIAGNOSIS — B9789 Other viral agents as the cause of diseases classified elsewhere: Principal | ICD-10-CM

## 2016-06-18 HISTORY — DX: COVID-19: U07.1

## 2016-06-18 MED ORDER — GUAIFENESIN ER 600 MG PO TB12: 600.0000 mg | ORAL_TABLET | Freq: Two times a day (BID) | ORAL | 0 refills | Status: DC

## 2016-06-18 MED ORDER — FLUTICASONE-SALMETEROL 250-50 MCG/DOSE IN AEPB: 1.0000 | INHALATION_SPRAY | Freq: Two times a day (BID) | RESPIRATORY_TRACT | 1 refills | Status: DC

## 2016-06-18 NOTE — Assessment & Plan Note (Signed)
A Viral URI with cough. Complaining of cough, sore throat, sneezing, rhinorrhea, sinus pressure, HAs, fevers, and chills that started a week ago. Most of her symptoms have now resolved bu she continues to have a productive cough, pleuritic CP, and sinus pressure. Lungs clear on auscultation. No oropharyngeal erythema or exudate. L nasal turbinate mildly erythematous but no tenderness on palpation of sinuses.Currently using Nasonex and Cetrizine.   P -Influenza vaccine at this visit -Mucinex 600 mg BID -Continue Nasonex and Cetrizine  -Advised patient to rerun to the clinic if her symptoms do no resolve or become worse in the next 4-5 days.

## 2016-06-18 NOTE — Progress Notes (Signed)
   CC: Patient is complaining of cough/ cold symptoms.   HPI:  [name] is a 60 y.o. F with a PMHx of conditions listed below presenting to the clinic complaining of cough, sore throat, sneezing, rhinorrhea, sinus pressure, HAs, fevers, and chills that started a week ago. States most of her symptoms have now resolved. However, she continues to complain a productive cough, pleuritic CP, and sinus pressure. Denies having any nausea or vomiting. She has a history of asthma and reports having 4-5 episodes of nighttime symptoms in a month. Denies any daytime symptoms. Currently using Advair and has not used her rescue inhaler for the past 1 year.   Past Medical History:  Diagnosis Date  . Allergy    seasonal, PCN, antidepressants, bentyl  . Anemia   . Asthma   . Carpal tunnel syndrome on both sides    right worse than left 2008  . Chronic lower back pain   . COPD (chronic obstructive pulmonary disease) ([place])   . GERD (gastroesophageal reflux disease)   . Hyperlipidemia    LDL 108 JULY 2013  . Positive TB test    From MArch 2009 note : Recent F/u at [facility]. CXRAy negative.  Positive TST in 2007 (80mm).  Unable to tolerate INH due to hepatotoxicity    Review of Systems:  Pertinent positives mentioned in HPI. Remainder of all ROS negative.   Physical Exam:  Vitals:   06/18/16 1536  BP: 122/66  Pulse: 77  Temp: 98 F (36.7 C)  TempSrc: Oral  SpO2: 98%  Weight: 161 lb (73 kg)  Height: 5' 3.5" (1.613 m)   Physical Exam  Constitutional: She is oriented to person, place, and time. She appears well-developed and well-nourished.  HENT:  Head: Normocephalic and atraumatic.  Mouth/Throat: Oropharynx is clear and moist. No oropharyngeal exudate.  No tenderness on palpation of maxillary, ethmoidal, and frontal sinuses.  L nasal turbinate appears mildly erythematous.   Eyes: EOM are normal.  Neck: Neck supple.  Cardiovascular: Normal rate, regular rhythm and intact distal pulses.    Pulmonary/Chest: Effort normal and breath sounds normal. No respiratory distress. She has no wheezes. She has no rales.  Abdominal: Soft. Bowel sounds are normal. She exhibits no distension. There is no tenderness. There is no guarding.  Musculoskeletal: Normal range of motion. She exhibits no deformity.  Lymphadenopathy:    She has no cervical adenopathy.  Neurological: She is alert and oriented to person, place, and time.  Skin: Skin is warm and dry.    Assessment & Plan:   See Encounters Tab for problem based charting.  Patient discussed with Dr. Lynnae January

## 2016-06-18 NOTE — Patient Instructions (Addendum)
Aimee Davis it was a pleasure meeting you today.   - Take Mucinex 600 mg twice daily as instructed.  - Continue using Nasonex and Cetrizine   - Advair has been refilled.   -Please return to the clinic if your symptoms do not resolve or become worse within the next 4-5 days.    Upper Respiratory Infection, Adult Most upper respiratory infections (URIs) are a viral infection of the air passages leading to the lungs. A URI affects the nose, throat, and upper air passages. The most common type of URI is nasopharyngitis and is typically referred to as "the common cold." URIs run their course and usually go away on their own. Most of the time, a URI does not require medical attention, but sometimes a bacterial infection in the upper airways can follow a viral infection. This is called a secondary infection. Sinus and middle ear infections are common types of secondary upper respiratory infections. Bacterial pneumonia can also complicate a URI. A URI can worsen asthma and chronic obstructive pulmonary disease (COPD). Sometimes, these complications can require emergency medical care and may be life threatening.  CAUSES Almost all URIs are caused by viruses. A virus is a type of germ and can spread from one person to another.  RISKS FACTORS You may be at risk for a URI if:   You smoke.   You have chronic heart or lung disease.  You have a weakened defense (immune) system.   You are very young or very old.   You have nasal allergies or asthma.  You work in crowded or poorly ventilated areas.  You work in health care facilities or schools. SIGNS AND SYMPTOMS  Symptoms typically develop 2-3 days after you come in contact with a cold virus. Most viral URIs last 7-10 days. However, viral URIs from the influenza virus (flu virus) can last 14-18 days and are typically more severe. Symptoms may include:   Runny or stuffy (congested) nose.   Sneezing.   Cough.   Sore throat.    Headache.   Fatigue.   Fever.   Loss of appetite.   Pain in your forehead, behind your eyes, and over your cheekbones (sinus pain).  Muscle aches.  DIAGNOSIS  Your health care provider may diagnose a URI by:  Physical exam.  Tests to check that your symptoms are not due to another condition such as:  Strep throat.  Sinusitis.  Pneumonia.  Asthma. TREATMENT  A URI goes away on its own with time. It cannot be cured with medicines, but medicines may be prescribed or recommended to relieve symptoms. Medicines may help:  Reduce your fever.  Reduce your cough.  Relieve nasal congestion. HOME CARE INSTRUCTIONS   Take medicines only as directed by your health care provider.   Gargle warm saltwater or take cough drops to comfort your throat as directed by your health care provider.  Use a warm mist humidifier or inhale steam from a shower to increase air moisture. This may make it easier to breathe.  Drink enough fluid to keep your urine clear or pale yellow.   Eat soups and other clear broths and maintain good nutrition.   Rest as needed.   Return to work when your temperature has returned to normal or as your health care provider advises. You may need to stay home longer to avoid infecting others. You can also use a face mask and careful hand washing to prevent spread of the virus.  Increase the usage of your  inhaler if you have asthma.   Do not use any tobacco products, including cigarettes, chewing tobacco, or electronic cigarettes. If you need help quitting, ask your health care provider. PREVENTION  The best way to protect yourself from getting a cold is to practice good hygiene.   Avoid oral or hand contact with people with cold symptoms.   Wash your hands often if contact occurs.  There is no clear evidence that vitamin C, vitamin E, echinacea, or exercise reduces the chance of developing a cold. However, it is always recommended to get plenty  of rest, exercise, and practice good nutrition.  SEEK MEDICAL CARE IF:   You are getting worse rather than better.   Your symptoms are not controlled by medicine.   You have chills.  You have worsening shortness of breath.  You have brown or red mucus.  You have yellow or brown nasal discharge.  You have pain in your face, especially when you bend forward.  You have a fever.  You have swollen neck glands.  You have pain while swallowing.  You have white areas in the back of your throat. SEEK IMMEDIATE MEDICAL CARE IF:   You have severe or persistent:  Headache.  Ear pain.  Sinus pain.  Chest pain.  You have chronic lung disease and any of the following:  Wheezing.  Prolonged cough.  Coughing up blood.  A change in your usual mucus.  You have a stiff neck.  You have changes in your:  Vision.  Hearing.  Thinking.  Mood. MAKE SURE YOU:   Understand these instructions.  Will watch your condition.  Will get help right away if you are not doing well or get worse.   This information is not intended to replace advice given to you by your health care provider. Make sure you discuss any questions you have with your health care provider.   Document Released: 02/27/2001 Document Revised: 01/18/2015 Document Reviewed: 12/09/2013 Elsevier Interactive Patient Education [employer].

## 2016-06-18 NOTE — Assessment & Plan Note (Addendum)
A Moderate persistent asthma. Reports having 4-5 episodes of nighttime symptoms in a month. Denies any daytime symptoms. Currently using Advair and has not used her rescue inhaler for the past 1 year.   P -Continue Advair 250-50 1 puff BID -Albuterol MDI prn  -Consider discontinuing Advair in the future if patient's symptoms improve

## 2016-06-19 NOTE — Progress Notes (Signed)
Internal Medicine Clinic Attending  Case discussed with Dr. Rathoreat the time of the visit. We reviewed the resident's history and exam and pertinent patient test results. I agree with the assessment, diagnosis, and plan of care documented in the resident's note.  

## 2016-08-31 ENCOUNTER — Other Ambulatory Visit: Payer: Self-pay | Admitting: *Deleted

## 2016-08-31 MED ORDER — FLUTICASONE-SALMETEROL 250-50 MCG/DOSE IN AEPB: 1.0000 | INHALATION_SPRAY | Freq: Two times a day (BID) | RESPIRATORY_TRACT | 0 refills | Status: DC

## 2016-09-03 ENCOUNTER — Ambulatory Visit (INDEPENDENT_AMBULATORY_CARE_PROVIDER_SITE_OTHER): Payer: No Typology Code available for payment source | Admitting: Pulmonary Disease

## 2016-09-03 ENCOUNTER — Encounter (INDEPENDENT_AMBULATORY_CARE_PROVIDER_SITE_OTHER): Payer: Self-pay

## 2016-09-03 DIAGNOSIS — R112 Nausea with vomiting, unspecified: Secondary | ICD-10-CM

## 2016-09-03 DIAGNOSIS — R1033 Periumbilical pain: Secondary | ICD-10-CM

## 2016-09-03 DIAGNOSIS — K581 Irritable bowel syndrome with constipation: Secondary | ICD-10-CM

## 2016-09-03 DIAGNOSIS — R197 Diarrhea, unspecified: Secondary | ICD-10-CM

## 2016-09-03 MED ORDER — PROMETHAZINE HCL 25 MG PO TABS: 25.0000 mg | ORAL_TABLET | Freq: Four times a day (QID) | ORAL | 0 refills | Status: DC | PRN

## 2016-09-03 NOTE — Progress Notes (Signed)
   CC: nausea/vomiting, diarrhea  HPI:  Aimee Davis is a 60 y.o. woman with history as noted below presenting with nausea/vomiting, diarrhea.  Saturday night had a wrap from [employer]. Sunday, she had nausea. She had vomiting x 4. Non bloody, non bilious. She had diarrhea as well. Last BM this morning. Loose. 4 yesterday and more watery. No hematochezia or melena. She felt myalgias, chills. She feels generalized weakness and headache. She does not take stool softeners. Low appetite. No previous abdominal surgery.   She has periumbilical pain. Soreness in nature. No radiation. Constant. Feels different from her irritable bowel syndrome. Vomiting makes the pain better. She has not eaten anything. She has been drinking a little bit of water and ginger ale. Feels like she is getting better overall.   She has irritable bowel syndrome which is usually constipation.   Past Medical History:  Diagnosis Date  . Allergy    seasonal, PCN, antidepressants, bentyl  . Anemia   . Asthma   . Carpal tunnel syndrome on both sides    right worse than left 2008  . Chronic lower back pain   . COPD (chronic obstructive pulmonary disease) ([place])   . GERD (gastroesophageal reflux disease)   . Hyperlipidemia    LDL 108 JULY 2013  . Positive TB test    From MArch 2009 note : Recent F/u at [facility]. CXRAy negative.  Positive TST in 2007 (54mm).  Unable to tolerate INH due to hepatotoxicity    Review of Systems:   +subjective fevers No cough No dyspnea No chest pain  Physical Exam:  Vitals:   09/03/16 1347  BP: 122/66  Pulse: 83  Temp: 97.5 F (36.4 C)  TempSrc: Oral  SpO2: 100%  Weight: 156 lb 4.8 oz (70.9 kg)  Height: 5' 3.5" (1.613 m)   General Apperance: NAD HEENT: Normocephalic, atraumatic, anicteric sclera Neck: Supple, trachea midline Lungs: Clear to auscultation bilaterally.  Heart: Regular rate and rhythm Abdomen: Soft, minimally tender to palpation most in lower quadrants,  nondistended, no rebound/guarding Extremities: Warm and well perfused, no edema Skin: No rashes or lesions Neurologic: Alert and interactive. No gross deficits.  Assessment & Plan:   See Encounters Tab for problem based charting.  Patient discussed with Dr. Dareen Piano

## 2016-09-03 NOTE — Patient Instructions (Signed)
Take the phenergan as instructed for your nausea Try to eat frequent small meals of bland food Stay hydrated by drinking water or broth Let us know if you have worsening or new symptoms Follow up in 1-2 months with your primary care provider

## 2016-09-04 DIAGNOSIS — R112 Nausea with vomiting, unspecified: Secondary | ICD-10-CM | POA: Insufficient documentation

## 2016-09-04 DIAGNOSIS — R197 Diarrhea, unspecified: Principal | ICD-10-CM

## 2016-09-04 NOTE — Progress Notes (Signed)
Internal Medicine Clinic Attending  Case discussed with Dr. Krall at the time of the visit.  We reviewed the resident's history and exam and pertinent patient test results.  I agree with the assessment, diagnosis, and plan of care documented in the resident's note.  

## 2016-09-04 NOTE — Assessment & Plan Note (Signed)
Assessment: Nausea, vomiting and diarrhea since Saturday with probable food trigger. Consistent with food borne self limited illness. Her diarrhea is resolving. Able to tolerate some PO. No diverticulosis noted on last colonoscopy. No peritoneal signs on exam.  Plan: Phenergan 25mg  q6hr prn nausea/vomiting Continue supportive care with oral hydration, bland food as tolerated Return precautions discussed.

## 2016-09-18 ENCOUNTER — Other Ambulatory Visit: Payer: Self-pay | Admitting: *Deleted

## 2016-09-18 NOTE — Telephone Encounter (Signed)
Has appt with pcp on 11/13/2016

## 2016-09-19 MED ORDER — ALBUTEROL SULFATE HFA 108 (90 BASE) MCG/ACT IN AERS: 2.0000 | INHALATION_SPRAY | Freq: Four times a day (QID) | RESPIRATORY_TRACT | 6 refills | Status: DC | PRN

## 2016-10-09 ENCOUNTER — Other Ambulatory Visit: Payer: Self-pay | Admitting: *Deleted

## 2016-10-09 MED ORDER — FLUTICASONE-SALMETEROL 250-50 MCG/DOSE IN AEPB: 1.0000 | INHALATION_SPRAY | Freq: Two times a day (BID) | RESPIRATORY_TRACT | 0 refills | Status: DC

## 2016-10-09 NOTE — Telephone Encounter (Signed)
Called in rx for albuterol proventil hfa

## 2016-10-19 ENCOUNTER — Encounter: Payer: No Typology Code available for payment source | Admitting: Internal Medicine

## 2016-11-12 ENCOUNTER — Telehealth: Payer: Self-pay | Admitting: Internal Medicine

## 2016-11-12 NOTE — Telephone Encounter (Signed)
APT. REMINDER CALL, LMTCB °

## 2016-11-13 ENCOUNTER — Encounter: Payer: Self-pay | Admitting: Internal Medicine

## 2016-11-13 ENCOUNTER — Ambulatory Visit (INDEPENDENT_AMBULATORY_CARE_PROVIDER_SITE_OTHER): Payer: No Typology Code available for payment source | Admitting: Internal Medicine

## 2016-11-13 DIAGNOSIS — G5603 Carpal tunnel syndrome, bilateral upper limbs: Secondary | ICD-10-CM

## 2016-11-13 DIAGNOSIS — Z7951 Long term (current) use of inhaled steroids: Secondary | ICD-10-CM

## 2016-11-13 DIAGNOSIS — J302 Other seasonal allergic rhinitis: Secondary | ICD-10-CM

## 2016-11-13 DIAGNOSIS — Z79899 Other long term (current) drug therapy: Secondary | ICD-10-CM

## 2016-11-13 DIAGNOSIS — Z1159 Encounter for screening for other viral diseases: Secondary | ICD-10-CM | POA: Insufficient documentation

## 2016-11-13 DIAGNOSIS — K581 Irritable bowel syndrome with constipation: Secondary | ICD-10-CM

## 2016-11-13 DIAGNOSIS — Z87891 Personal history of nicotine dependence: Secondary | ICD-10-CM

## 2016-11-13 DIAGNOSIS — J4540 Moderate persistent asthma, uncomplicated: Secondary | ICD-10-CM

## 2016-11-13 MED ORDER — MOMETASONE FUROATE 50 MCG/ACT NA SUSP: NASAL | 3 refills | Status: DC

## 2016-11-13 NOTE — Patient Instructions (Addendum)
It was a pleasure to meet you today Aimee Davis   For your carpal tunnel syndrome, keep using the wrist braces at night.  I am not making any changes to your medications today and have reordered the ones that you've requested.   Please schedule a follow up appointment in 1 year

## 2016-11-13 NOTE — Progress Notes (Signed)
   CC: Follow-up for asthma  HPI: Aimee Davis is a 61 y.o. with past medical history of seasonal allergies, asthma, irritable bowel syndrome, and carpal tunnel  who presents to clinic for follow up of asthma.  Please see problem list for status of the pt's chronic medical problems.  Past Medical History:  Diagnosis Date  . Allergy    seasonal, PCN, antidepressants, bentyl  . Anemia   . Asthma   . Carpal tunnel syndrome on both sides    right worse than left 2008  . Chronic lower back pain   . COPD (chronic obstructive pulmonary disease) ([place])   . GERD (gastroesophageal reflux disease)   . Hyperlipidemia    LDL 108 JULY 2013  . Positive TB test    From MArch 2009 note : Recent F/u at [facility]. CXRAy negative.  Positive TST in 2007 (36mm).  Unable to tolerate INH due to hepatotoxicity    Review of Systems:  Please see each problem below for a pertinent review of systems.  Physical Exam:  Vitals:   11/13/16 1546  BP: 128/74  Pulse: 78  Temp: 98.3 F (36.8 C)  TempSrc: Oral  SpO2: 100%  Weight: 157 lb 3.2 oz (71.3 kg)  Height: 5' 3.5" (1.613 m)   Physical Exam  Constitutional: She appears well-developed and well-nourished. No distress.  HENT:  Head: Normocephalic and atraumatic.  Eyes: Conjunctivae are normal. No scleral icterus.  Cardiovascular: Normal rate and regular rhythm.   No murmur heard. Pulmonary/Chest: Effort normal. No respiratory distress. She has no wheezes. She has no rales.  Neurological: She is alert.  Skin: Skin is warm and dry. She is not diaphoretic.  Psychiatric: She has a normal mood and affect. Her behavior is normal.     Assessment & Plan:   See Encounters Tab for problem based charting.  Asthma  Moderate persistent asthma. Has dyspnea on exertion when she forgets to take Advair inhaler but only needs albuterol about once per month. -continue advair 1 puff BID  -continue albuterol   Allergies Experiences congestion and sinus  pressure about twice per month which responds well to Nasonex and Claritin.  -Refilled Nasonex -Continue Claritin  Carpal tunnel Describes numbness, throbbing, and stiffness in hands and fingers is worse first thing in the morning and improves with use. This hand pain improves with wrist splinting. She has curiosity about surgical options but would like to continue with splinting for now. - continue wrist splinting at night   IBS- C  Symptoms have improved with changes in diet and probiotic supplementation.  -Continue conservative management  Healthcare maintenance  Hep C screening today   Patient discussed with Dr. Lynnae January

## 2016-11-14 LAB — HEPATITIS C ANTIBODY: Hep C Virus Ab: <0.1 s/co ratio (ref 0.0–0.9)

## 2016-11-14 NOTE — Progress Notes (Signed)
Internal Medicine Clinic Attending  Case discussed with Dr. Blum at the time of the visit.  We reviewed the resident's history and exam and pertinent patient test results.  I agree with the assessment, diagnosis, and plan of care documented in the resident's note. 

## 2016-11-22 ENCOUNTER — Other Ambulatory Visit: Payer: Self-pay | Admitting: *Deleted

## 2016-11-23 MED ORDER — FLUTICASONE-SALMETEROL 250-50 MCG/DOSE IN AEPB: 1.0000 | INHALATION_SPRAY | Freq: Two times a day (BID) | RESPIRATORY_TRACT | 5 refills | Status: DC

## 2016-11-29 ENCOUNTER — Telehealth: Payer: Self-pay | Admitting: Internal Medicine

## 2016-11-29 NOTE — Telephone Encounter (Signed)
APT. REMINDER CALL, LMTCB °

## 2016-11-30 ENCOUNTER — Ambulatory Visit: Payer: No Typology Code available for payment source

## 2016-12-04 ENCOUNTER — Ambulatory Visit: Payer: No Typology Code available for payment source

## 2016-12-24 ENCOUNTER — Telehealth: Payer: Self-pay

## 2016-12-24 NOTE — Telephone Encounter (Signed)
Spoke w/ pt at length, she has been exposed to the flu and she is worried, she thought she had gotten flu shot but according to records can not find documentation. She will wait 2 weeks and make appt to come in for shot

## 2016-12-24 NOTE — Telephone Encounter (Signed)
Pt needs to speak with a nurse about flu.

## 2017-01-31 ENCOUNTER — Ambulatory Visit (INDEPENDENT_AMBULATORY_CARE_PROVIDER_SITE_OTHER): Payer: Self-pay | Admitting: Internal Medicine

## 2017-01-31 ENCOUNTER — Encounter: Payer: Self-pay | Admitting: Internal Medicine

## 2017-01-31 VITALS — BP 131/62 | HR 58 | Temp 97.4°F | Ht 63.5 in | Wt 159.7 lb

## 2017-01-31 DIAGNOSIS — K581 Irritable bowel syndrome with constipation: Secondary | ICD-10-CM

## 2017-01-31 DIAGNOSIS — K219 Gastro-esophageal reflux disease without esophagitis: Secondary | ICD-10-CM

## 2017-01-31 MED ORDER — PANTOPRAZOLE SODIUM 40 MG PO TBEC: 40.0000 mg | DELAYED_RELEASE_TABLET | Freq: Every day | ORAL | 1 refills | Status: DC

## 2017-01-31 MED ORDER — HYOSCYAMINE SULFATE 0.125 MG SL SUBL: 0.2500 mg | SUBLINGUAL_TABLET | Freq: Every day | SUBLINGUAL | 2 refills | Status: DC | PRN

## 2017-01-31 NOTE — Progress Notes (Signed)
Case discussed with Dr. Ahmed at the time of the visit. We reviewed the resident's history and exam and pertinent patient test results. I agree with the assessment, diagnosis, and plan of care documented in the resident's note. 

## 2017-01-31 NOTE — Assessment & Plan Note (Signed)
Has hx of GERd, having heart burn but now on any meds.   Will start protonix 40mg  daily.

## 2017-01-31 NOTE — Progress Notes (Signed)
   CC: stomach pain/IBS  HPI:  [name] is a 61 y.o. with pmh as listed below is here with stomach pain.   Past Medical History:  Diagnosis Date  . Allergy    seasonal, PCN, antidepressants, bentyl  . Anemia   . Asthma   . Carpal tunnel syndrome on both sides    right worse than left 2008  . Chronic lower back pain   . COPD (chronic obstructive pulmonary disease) ([place])   . GERD (gastroesophageal reflux disease)   . Hyperlipidemia    LDL 108 JULY 2013  . Positive TB test    From MArch 2009 note : Recent F/u at [facility]. CXRAy negative.  Positive TST in 2007 (66mm).  Unable to tolerate INH due to hepatotoxicity   Has hx of IBS-C. On daily probiotic and hyoscyamine (antispasmotic). Gets Spasms/cramps daily in the morning right after waking up,  Gets better with bowel movement and also as the day progresses, having to take hyoscyamine daily. She is aware of what food not to eat and she is avoiding them such as caffeine, spicy food, and fatty food.   Saw GI 2 years ago, stopped seeing her b/c lack of insurance, cscope was normal previously. No alarming sx such as bloody BM or weight loss.   Having lot of bloating and also heart burn. Not on any GERD meds.   Review of Systems:   Review of Systems  Constitutional: Negative for chills and fever.  Cardiovascular: Negative for chest pain and palpitations.  Gastrointestinal: Positive for abdominal pain, constipation and heartburn. Negative for blood in stool, diarrhea, nausea and vomiting.  Neurological: Negative for dizziness and headaches.     Physical Exam:  Vitals:   01/31/17 0924  BP: 131/62  Pulse: (!) 58  Temp: 97.4 F (36.3 C)  TempSrc: Oral  SpO2: 100%  Weight: 159 lb 11.2 oz (72.4 kg)  Height: 5' 3.5" (1.613 m)   Physical Exam  Constitutional: She is oriented to person, place, and time. She appears well-developed and well-nourished.  Cardiovascular: Normal rate and regular rhythm.  Exam reveals no gallop and  no friction rub.   No murmur heard. Respiratory: Effort normal and breath sounds normal.  Musculoskeletal: Normal range of motion. She exhibits no edema.  Neurological: She is alert and oriented to person, place, and time.    Assessment & Plan:   See Encounters Tab for problem based charting.  Patient discussed with Dr. Eppie Gibson

## 2017-01-31 NOTE — Patient Instructions (Signed)
We will increase your hyoscyamine to two tablets as needed. Take it after you wake up.  Also take protonix daily before meals to help with your heart burn.

## 2017-01-31 NOTE — Assessment & Plan Note (Signed)
Continues to suffer from IBS, mainly in the mornings with spasm, better with BM. May need higher dose of antispasmotic. Already avoiding food triggers.   Will increase hyoscyamine to 0.25 mg daily in the mornings Asked to take OTC fiber supplements F/up in 3 months.

## 2017-03-06 ENCOUNTER — Other Ambulatory Visit: Payer: Self-pay | Admitting: Internal Medicine

## 2017-03-11 ENCOUNTER — Other Ambulatory Visit: Payer: Self-pay | Admitting: Internal Medicine

## 2017-03-11 DIAGNOSIS — Z1231 Encounter for screening mammogram for malignant neoplasm of breast: Secondary | ICD-10-CM

## 2017-03-26 ENCOUNTER — Encounter: Payer: Self-pay | Admitting: Internal Medicine

## 2017-03-26 ENCOUNTER — Ambulatory Visit (INDEPENDENT_AMBULATORY_CARE_PROVIDER_SITE_OTHER): Payer: Self-pay | Admitting: Internal Medicine

## 2017-03-26 DIAGNOSIS — M545 Low back pain, unspecified: Secondary | ICD-10-CM

## 2017-03-26 DIAGNOSIS — J302 Other seasonal allergic rhinitis: Secondary | ICD-10-CM

## 2017-03-26 DIAGNOSIS — Z7951 Long term (current) use of inhaled steroids: Secondary | ICD-10-CM

## 2017-03-26 DIAGNOSIS — J45909 Unspecified asthma, uncomplicated: Secondary | ICD-10-CM

## 2017-03-26 DIAGNOSIS — Z87891 Personal history of nicotine dependence: Secondary | ICD-10-CM

## 2017-03-26 DIAGNOSIS — Z79899 Other long term (current) drug therapy: Secondary | ICD-10-CM

## 2017-03-26 DIAGNOSIS — J4540 Moderate persistent asthma, uncomplicated: Secondary | ICD-10-CM

## 2017-03-26 MED ORDER — FLUTICASONE-SALMETEROL 250-50 MCG/DOSE IN AEPB: 1.0000 | INHALATION_SPRAY | Freq: Two times a day (BID) | RESPIRATORY_TRACT | 5 refills | Status: DC

## 2017-03-26 MED ORDER — MOMETASONE FUROATE 50 MCG/ACT NA SUSP: NASAL | 3 refills | Status: DC

## 2017-03-26 MED ORDER — FLUTICASONE PROPIONATE HFA 110 MCG/ACT IN AERO: 2.0000 | INHALATION_SPRAY | Freq: Two times a day (BID) | RESPIRATORY_TRACT | 12 refills | Status: DC

## 2017-03-26 NOTE — Progress Notes (Signed)
   CC: follow up of asthma, back pain   HPI:  [name] is a 61 y.o. with PMH of Asthma, allergic rhinitis, and carpal tunnel presents for follow-up of asthma with a concern for right-sided low back pain.   Past Medical History:  Diagnosis Date  . Allergy    seasonal, PCN, antidepressants, bentyl  . Anemia   . Asthma   . Carpal tunnel syndrome on both sides    right worse than left 2008  . GERD (gastroesophageal reflux disease)   . Hyperlipidemia    LDL 108 JULY 2013  . Positive TB test    From MArch 2009 note : Recent F/u at [facility] CXRAy negative.  Positive TST in 2007 (61mm).  Unable to tolerate INH due to hepatotoxicity   Review of Systems:  Refer to HPI and assessment and plans tab, all others reviewed and negative.   Physical Exam:  Vitals:   03/26/17 1421  BP: 95/66  Pulse: 64  Temp: 98.3 F (36.8 C)  TempSrc: Oral  SpO2: 98%  Weight: 161 lb 6.4 oz (73.2 kg)  Height: 5' 3.5" (1.613 m)   Physical Exam  Constitutional: She is well-developed, well-nourished, and in no distress. No distress.  HENT:  Head: Normocephalic and atraumatic.  Cardiovascular: Normal rate and regular rhythm.   No murmur heard. No peripheral edema   Pulmonary/Chest: Effort normal and breath sounds normal. She has no wheezes. She has no rales.  Abdominal: Soft. Bowel sounds are normal. She exhibits no distension. There is no tenderness. There is no guarding.  Musculoskeletal: She exhibits no deformity.  No tenderness to palpation over her spine or paraspine.   Neurological: She is alert. Gait normal.  Skin: Skin is warm and dry. She is not diaphoretic.  Psychiatric: Affect and judgment normal.    Assessment & Plan:   Back pain  For the past 3 weeks shes had a pain on her right side. Its worse with standing and walking and feels like a stretching. There is no sharp or shooting component to the pain. She has no loss of sensation in her legs, weakness, saddle anesthesia or  incontinence. Had no trauma to the area. She denies symptoms of urinary tract infection. She's not tried heat or over-the-counter pain medications to relieve this pain. On exam she has no tenderness to palpation over the right side. Her symptoms may be consistent with lumbar radiculopathy given arthritis of the lumbar spine seen on previous x-rays. -Trial of heat and NSAIDs -Return to clinic if the pain persists   Intermittent Asthma  Has slight difficulty breathing and wheeze when she walks out of her home into the heat. This improves when she uses her seasonal allergy prophylaxis. She has no symptoms which awaken her at night. Has not used albuterol in the past month.  Assessment: Intermittent asthma, will start to decelerate maintenance therapy as tolerated   - discontinue advair  - prescribed flovent 110 mcg 2 puffs BID, will need to continue to taper this as tolerated  - continue albuterol PRN   Seasonal allergies  Well controlled with Nasonex and loratadine. No symptoms today. -refilled nasonex  - continue loratadine   See Encounters Tab for problem based charting.  Patient discussed with Dr. Daryll Drown

## 2017-03-26 NOTE — Patient Instructions (Addendum)
Ms. Buitron,   Please call [facility] to schedule the colonoscopy : [phone]  For your back pain,  Try using heating packs and an NSAID like aleve to relieve this pain.   Her asthma Stop using the Advair Start using Flovent 2 puffs twice daily Continue using albuterol as needed I have refilled her Nasonex please continue using the  Schedule a follow-up appointment in 1 year or sooner if you develop any new concerns or complaints. Your pharmacy know if you need a refill on your any appear medications and have them send a request to me. If you have worsening of your asthma when the weather changes call us to make an appointment.

## 2017-03-29 ENCOUNTER — Encounter: Payer: Self-pay | Admitting: Internal Medicine

## 2017-03-29 DIAGNOSIS — J302 Other seasonal allergic rhinitis: Secondary | ICD-10-CM | POA: Insufficient documentation

## 2017-03-29 DIAGNOSIS — M545 Low back pain, unspecified: Secondary | ICD-10-CM | POA: Insufficient documentation

## 2017-03-29 NOTE — Assessment & Plan Note (Signed)
For the past 3 weeks shes had a pain on her right side. Its worse with standing and walking and feels like a stretching. There is no sharp or shooting component to the pain. She has no loss of sensation in her legs, weakness, saddle anesthesia or incontinence. Had no trauma to the area. She denies symptoms of urinary tract infection. She's not tried heat or over-the-counter pain medications to relieve this pain. On exam she has no tenderness to palpation over the right side. Her symptoms may be consistent with lumbar radiculopathy given arthritis of the lumbar spine seen on previous x-rays. -Trial of heat and NSAIDs -Return to clinic if the pain persists

## 2017-03-29 NOTE — Assessment & Plan Note (Signed)
Has slight difficulty breathing and wheeze when she walks out of her home into the heat. This improves when she uses her seasonal allergy prophylaxis. She has no symptoms which awaken her at night. Has not used albuterol in the past month.  Assessment: Intermittent asthma, will start to decelerate maintenance therapy as tolerated   - discontinue advair  - prescribed flovent 110 mcg 2 puffs BID, will need to continue to taper this as tolerated  - continue albuterol PRN

## 2017-03-29 NOTE — Assessment & Plan Note (Signed)
Well controlled with Nasonex and loratadine. No symptoms today. -refilled nasonex  - continue loratadine

## 2017-04-01 NOTE — Progress Notes (Signed)
Internal Medicine Clinic Attending  Case discussed with Dr. Blum at the time of the visit.  We reviewed the resident's history and exam and pertinent patient test results.  I agree with the assessment, diagnosis, and plan of care documented in the resident's note. 

## 2017-05-16 ENCOUNTER — Telehealth: Payer: Self-pay | Admitting: Internal Medicine

## 2017-05-16 NOTE — Telephone Encounter (Signed)
Called pt - stated she will talk to the doctor tomorow at her appt.

## 2017-05-16 NOTE — Telephone Encounter (Signed)
Patient would like a call back about her inhaler medications.

## 2017-05-17 ENCOUNTER — Ambulatory Visit: Payer: Self-pay

## 2017-05-21 ENCOUNTER — Encounter: Payer: Self-pay | Admitting: Internal Medicine

## 2017-05-21 ENCOUNTER — Ambulatory Visit (INDEPENDENT_AMBULATORY_CARE_PROVIDER_SITE_OTHER): Payer: Self-pay | Admitting: Internal Medicine

## 2017-05-21 VITALS — BP 115/64 | HR 79 | Temp 98.1°F | Wt 159.9 lb

## 2017-05-21 DIAGNOSIS — Z0000 Encounter for general adult medical examination without abnormal findings: Secondary | ICD-10-CM

## 2017-05-21 DIAGNOSIS — J4540 Moderate persistent asthma, uncomplicated: Secondary | ICD-10-CM

## 2017-05-21 DIAGNOSIS — G479 Sleep disorder, unspecified: Secondary | ICD-10-CM | POA: Insufficient documentation

## 2017-05-21 MED ORDER — FLUTICASONE-SALMETEROL 250-50 MCG/DOSE IN AEPB: 1.0000 | INHALATION_SPRAY | Freq: Two times a day (BID) | RESPIRATORY_TRACT | 3 refills | Status: DC

## 2017-05-21 NOTE — Assessment & Plan Note (Addendum)
Patient declined receiving the Flu vaccine on 05/21/2017. She was interested but would rather receive it at next visit.

## 2017-05-21 NOTE — Assessment & Plan Note (Signed)
Assessment:  Acute onset with difficulty falling asleep occurring within the last 2 weeks.  Most likely due to change in the patient's sleep schedule because she has been up late caring for her mother on certain days of the week. Patient has been using OTC sleep aids, which help at times.   Plan: -Discussed sleep hygiene with the patient -Recommended limiting OTC sleep aids for short period of time  -Return to clinic earlier if sleep concerns persist

## 2017-05-21 NOTE — Progress Notes (Signed)
   CC: Asthma  HPI:  Aimee Davis is a 61 y.o. with past medical history as documented below presenting for worsening asthma symptoms for one month. Last visit the patient's maintenance medication was deescalated from Advair to Flovent. The patient states that her symptoms have worsened since the change in her medications. She states that she is having daily symptoms of shortness of breath and wheezing on exertion. She also states she has been waking up with wheezing and shortness of breath at least twice per week. She states that she does not use her albuterol inhaler when she experiences symptoms, but will try to rest and let the symptoms pass on their own. Her last albuterol use was about 1 month ago.   The patient also is complaining of difficulty falling asleep. She attributes it to staying up late to help take care of her mother on certain days of the week. She has tried Melatonin in the past, which did not help her. She has also tried Unisom, which helped her fall asleep but made her very drowsy the day after taking it.   Past Medical History:  Diagnosis Date  . Allergy    seasonal, PCN, antidepressants, bentyl  . Anemia   . Asthma   . Carpal tunnel syndrome on both sides    right worse than left 2008  . GERD (gastroesophageal reflux disease)   . Hyperlipidemia    LDL 108 JULY 2013  . Positive TB test    From MArch 2009 note : Recent F/u at [facility]. CXRAy negative.  Positive TST in 2007 (63mm).  Unable to tolerate INH due to hepatotoxicity   Review of Systems:   Review of Systems  Constitutional: Negative for chills and fever.  HENT: Negative for congestion and sinus pain.   Eyes: Negative.   Respiratory: Positive for shortness of breath and wheezing. Negative for cough.   Cardiovascular: Negative for chest pain.  Gastrointestinal: Negative for diarrhea, nausea and vomiting.  Genitourinary: Negative.   Musculoskeletal: Negative.   Neurological: Negative.     Psychiatric/Behavioral: The patient has insomnia.     Physical Exam:  Vitals:   05/21/17 1502  BP: 115/64  Pulse: 79  Temp: 98.1 F (36.7 C)  TempSrc: Oral  SpO2: 99%  Weight: 159 lb 14.4 oz (72.5 kg)   Physical Exam  Constitutional: She is oriented to person, place, and time. She appears well-developed and well-nourished. No distress.  HENT:  Head: Normocephalic and atraumatic.  Eyes: Conjunctivae and EOM are normal.  Neck: Normal range of motion. Neck supple.  Cardiovascular: Normal rate, regular rhythm, normal heart sounds and intact distal pulses.  Exam reveals no gallop and no friction rub.   No murmur heard. Pulmonary/Chest: Effort normal. No respiratory distress. She has no wheezes.  Abdominal: Soft. Bowel sounds are normal. She exhibits no distension. There is no tenderness.  Musculoskeletal: She exhibits no edema.  Neurological: She is alert and oriented to person, place, and time.  Skin: Skin is warm and dry.    Assessment & Plan:   See Encounters Tab for problem based charting.  Patient seen with Dr. Evette Doffing

## 2017-05-21 NOTE — Assessment & Plan Note (Addendum)
Assessment:   Not well controlled, moderate persistent asthma with daily symptoms and two night time awakenings per week most likely secondary to deescalation of maintenance inhaler from Advair to Flovent.   Plan:  -Stop Flovent -Restart Advair 250/50 1 puff twice daily  -Albuterol 2 puffs every 6 hours as needed

## 2017-05-21 NOTE — Patient Instructions (Addendum)
Aimee Davis,   Please stop taking Flovent.   Please start taking Advair. Take 1 puff of Advair twice daily.   Use your albuterol inhaler as needed for shortness of breath and wheezing. Take 2 puffs of albuterol every six hours as needed.   If your allergies are not controlled on Claritin you can try Xyzal or Zyrtec, which are both over the counter medicines for allergies.

## 2017-05-22 NOTE — Progress Notes (Signed)
Internal Medicine Clinic Attending  I saw and evaluated the patient.  I personally confirmed the key portions of the history and exam documented by Dr. LaCroce and I reviewed pertinent patient test results.  The assessment, diagnosis, and plan were formulated together and I agree with the documentation in the resident's note.  

## 2017-05-22 NOTE — Addendum Note (Signed)
Addended by: Lalla Brothers T on: 05/22/2017 11:33 AM   Modules accepted: Level of Service

## 2017-06-11 ENCOUNTER — Ambulatory Visit: Payer: Self-pay

## 2017-06-20 ENCOUNTER — Encounter (INDEPENDENT_AMBULATORY_CARE_PROVIDER_SITE_OTHER): Payer: Self-pay

## 2017-06-20 ENCOUNTER — Ambulatory Visit: Payer: Self-pay

## 2017-06-28 ENCOUNTER — Ambulatory Visit
Admission: RE | Admit: 2017-06-28 | Discharge: 2017-06-28 | Disposition: A | Discharge status: Home / Self Care | Payer: No Typology Code available for payment source | Source: Ambulatory Visit | Attending: Family Medicine | Admitting: Family Medicine

## 2017-06-28 DIAGNOSIS — Z1231 Encounter for screening mammogram for malignant neoplasm of breast: Secondary | ICD-10-CM

## 2017-07-04 ENCOUNTER — Ambulatory Visit (INDEPENDENT_AMBULATORY_CARE_PROVIDER_SITE_OTHER): Payer: Self-pay | Admitting: Internal Medicine

## 2017-07-04 DIAGNOSIS — K5909 Other constipation: Secondary | ICD-10-CM

## 2017-07-04 DIAGNOSIS — R109 Unspecified abdominal pain: Secondary | ICD-10-CM

## 2017-07-04 DIAGNOSIS — G8929 Other chronic pain: Secondary | ICD-10-CM

## 2017-07-04 NOTE — Patient Instructions (Addendum)
Aimee Davis,  It was a pleasure to see you today. I am sorry to hear about your abdominal pain. I have placed a referral for you to be seen by a stomach doctor. You will be contacted to schedule an appointment. Please follow up with your primary doctor in 3 months. If you have any questions or concerns, call our clinic at [phone] or after hours call [phone] and ask for the internal medicine resident on call. Thank you!  - Dr. Philipp Ovens

## 2017-07-05 NOTE — Progress Notes (Signed)
Internal Medicine Clinic Attending  Case discussed with Dr. Philipp Ovens  at the time of the visit.  We reviewed the resident's history and exam and pertinent patient test results.  I agree with the assessment, diagnosis, and plan of care documented in the resident's note.  Here for acute on chronic abdominal pain, has responded well to trigger point injections in the past. No red flag symptoms, will refer back to [facility].  Oda Kilts, MD

## 2017-07-05 NOTE — Progress Notes (Signed)
   CC: Abdominal pain  HPI:  [name] is a 61 y.o. female with past medical history outlined below here for abdominal pain. For the details of today's visit, please refer to the assessment and plan.  Past Medical History:  Diagnosis Date  . Allergy    seasonal, PCN, antidepressants, bentyl  . Anemia   . Asthma   . Carpal tunnel syndrome on both sides    right worse than left 2008  . GERD (gastroesophageal reflux disease)   . Hyperlipidemia    LDL 108 JULY 2013  . Positive TB test    From MArch 2009 note : Recent F/u at [facility]. CXRAy negative.  Positive TST in 2007 (30mm).  Unable to tolerate INH due to hepatotoxicity    Review of Systems  Constitutional: Negative for fever.  Gastrointestinal: Positive for abdominal pain. Negative for blood in stool, constipation, diarrhea, nausea and vomiting.     Physical Exam:  Vitals:   07/04/17 1354  BP: (!) 106/58  Pulse: 84  Temp: 98.4 F (36.9 C)  TempSrc: Oral  SpO2: 97%  Weight: 156 lb 8 oz (71 kg)  Height: 5' 0.3" (1.532 m)    Constitutional: NAD, appears comfortable Cardiovascular: RRR, no murmurs, rubs, or gallops.  Pulmonary/Chest: CTAB, no wheezes, rales, or rhonchi.  Abdomen: Soft, non tender to palpation, non distended. Normal BS. No rebound or guarding. No organomegaly.  Extremities: Warm and well perfused. No edema.  Psychiatric: Normal mood and affect  Assessment & Plan:   See Encounters Tab for problem based charting.  Patient discussed with Dr. Rebeca Alert

## 2017-07-05 NOTE — Assessment & Plan Note (Signed)
Patient is here today with complaint of abdominal pain. She describes the pain as a spasm in her abdomen. She denies weight loss, changes in her bowels, and blood in her stool. No nausea, vomiting, or diarrhea. She has constipation but this is chronic. She manages this with over-the-counter stool softeners and laxatives. She reports that her pain is worse in the morning and improves throughout the day. She is unable to associate any foods or triggers with the pain. This is a chronic issue. She previously followed with GI at [facility] and has been extensively worked up. She underwent both an EGD and colonoscopy that were unable to identify an etiology for her pain. Per records in care everywhere, she was felt to have irritable bowel syndrome for which she has been on an anti-spasmodic as well as abdominal wall muscle spasms. She was treated with trigger point injections of her abdomen with significant improvement of her pain. Patient reports she's been doing very well for the past year with essentially no pain. She has not seen her gastroenterologist since 2016. Patient reports her pain recurred about 2 months ago and has persisted. She is agreeable to following up with [facility]. -- F/u GI at [facility], new referral placed  -- Continue Levsin daily

## 2017-07-11 ENCOUNTER — Telehealth: Payer: Self-pay

## 2017-07-11 NOTE — Telephone Encounter (Signed)
Spoke w/ pt, reassured her and offered appt, she declined but stated she felt better after talking

## 2017-07-11 NOTE — Telephone Encounter (Signed)
Requesting to speak with a nurse about stomach pain. Please call pt back.

## 2017-07-19 MED FILL — DICYCLOMINE 10 MG CAPSULE: 10 | 30 days supply | Qty: 90 | Fill #0 | Status: TO

## 2017-09-11 ENCOUNTER — Telehealth: Payer: Self-pay | Admitting: Internal Medicine

## 2017-09-11 NOTE — Telephone Encounter (Signed)
   Reason for call:   I received a call from [name] at 1:45 PM indicating she is having sore throat, cough and chills.   Pertinent Data:   Patient states sore throat started 2 days ago, and 1 day ago she started having chills, rhinorrhea, cough productive of tan phlegm, muscle aches and headache.  Patient stated drinking hot tea, taking robitussin and mucinex yesterday but has not felt much better today.   She is unable to go to an urgent care today but will have transportation tomorrow.   Assessment / Plan / Recommendations:   URI with cough, likely viral; there is concern for flu with myalgias.  Advised patient to continue symptomatic management and visit either our clinic or urgent care tomorrow morning for rapid flu testing.   As always, pt is advised that if symptoms worsen or new symptoms arise, they should go to an urgent care facility or to to ER for further evaluation.   Alphonzo Grieve, MD   09/11/2017, 2:23 PM

## 2017-09-15 ENCOUNTER — Ambulatory Visit (INDEPENDENT_AMBULATORY_CARE_PROVIDER_SITE_OTHER): Payer: Self-pay

## 2017-09-15 ENCOUNTER — Encounter (HOSPITAL_COMMUNITY): Payer: Self-pay | Admitting: Emergency Medicine

## 2017-09-15 ENCOUNTER — Other Ambulatory Visit: Payer: Self-pay

## 2017-09-15 ENCOUNTER — Ambulatory Visit (HOSPITAL_COMMUNITY)
Admission: EM | Admit: 2017-09-15 | Discharge: 2017-09-15 | Disposition: A | Discharge status: Home / Self Care | Payer: Self-pay | Attending: Internal Medicine | Admitting: Internal Medicine

## 2017-09-15 DIAGNOSIS — J181 Lobar pneumonia, unspecified organism: Secondary | ICD-10-CM

## 2017-09-15 DIAGNOSIS — J189 Pneumonia, unspecified organism: Secondary | ICD-10-CM

## 2017-09-15 LAB — POCT I-STAT, CHEM 8
BUN: 7 mg/dL (ref 6–20)
CHLORIDE: 105 mmol/L (ref 101–111)
Calcium, Ion: 1.24 mmol/L (ref 1.15–1.40)
Creatinine, Ser: 0.8 mg/dL (ref 0.44–1.00)
Glucose, Bld: 103 mg/dL — ABNORMAL HIGH (ref 65–99)
HCT: 34 % — ABNORMAL LOW (ref 36.0–46.0)
Hemoglobin: 11.6 g/dL — ABNORMAL LOW (ref 12.0–15.0)
POTASSIUM: 3.9 mmol/L (ref 3.5–5.1)
SODIUM: 140 mmol/L (ref 135–145)
TCO2: 24 mmol/L (ref 22–32)

## 2017-09-15 MED ORDER — CEFDINIR 300 MG PO CAPS: 300.0000 mg | ORAL_CAPSULE | Freq: Two times a day (BID) | ORAL | 0 refills | Status: DC

## 2017-09-15 MED ORDER — GUAIFENESIN-CODEINE 100-10 MG/5ML PO SYRP: ORAL_SOLUTION | ORAL | 0 refills | Status: DC

## 2017-09-15 MED ORDER — AZITHROMYCIN 250 MG PO TABS: ORAL_TABLET | ORAL | 0 refills | Status: DC

## 2017-09-15 NOTE — ED Provider Notes (Signed)
[facility]    CSN: [id number] Arrival date & time: 09/15/17  1300     History   Chief Complaint Chief Complaint  Patient presents with  . URI    HPI Aimee Davis is a 61 y.o. female.   61 year old female that around 1224 developed a scratchy throat associated with myalgias, chills, decreased appetite and headache. She states 2 days ago she had a Butcher of 99.6. She has a PMH of asthma. Currently her complaint is occasional cough with blood-tinged. She feels achy and a little off balance and weak. No chest pain but equivocal shortness of breath. 98.4. 98% saturation.      Past Medical History:  Diagnosis Date  . Allergy    seasonal, PCN, antidepressants, bentyl  . Anemia   . Asthma   . Carpal tunnel syndrome on both sides    right worse than left 2008  . GERD (gastroesophageal reflux disease)   . Hyperlipidemia    LDL 108 JULY 2013  . Positive TB test    From MArch 2009 note : Recent F/u at [facility]. CXRAy negative.  Positive TST in 2007 (33mm).  Unable to tolerate INH due to hepatotoxicity    Patient Active Problem List   Diagnosis Date Noted  . Chronic abdominal pain 07/04/2017  . Sleep difficulties 05/21/2017  . Lumbar back pain 03/29/2017  . Seasonal allergies 03/29/2017  . Multiple nevi 03/01/2015  . H/O: hysterectomy 01/25/2015  . Cataract 06/11/2013  . Healthcare maintenance 06/11/2013  . Patellofemoral pain syndrome--R KNEE 03/17/2013  . Positive TB test   . Sinus headache 06/05/2012  . CARPAL TUNNEL SYNDROME, BILATERAL 03/29/2008  . Asthma 01/31/2007  . Irritable bowel syndrome 11/14/2006    Past Surgical History:  Procedure Laterality Date  . BREAST CYST EXCISION Right 2011  . BREAST EXCISIONAL BIOPSY Left    benign  . BREAST LUMPECTOMY Right 1990's  . INDUCED ABORTION  2005  . VAGINAL HYSTERECTOMY  1983   partial; for abnormal bleeding    OB History    No data available       Home Medications    Prior to Admission  medications   Medication Sig Start Date End Date Taking? Authorizing Provider  dicyclomine (BENTYL) 10 MG capsule Take 10 mg by mouth 4 (four) times daily -  before meals and at bedtime.   Yes [provider]  albuterol (PROVENTIL HFA;VENTOLIN HFA) 108 (90 Base) MCG/ACT inhaler Inhale 2 puffs into the lungs every 6 (six) hours as needed for wheezing. 09/19/16   Ledell Noss, MD  Artificial Tear Ointment (DRY EYES OP) Apply 1 drop to eye daily as needed (dry eyes).    [provider]  Ascorbic Acid (VITAMIN C) 100 MG tablet Take 50 mg by mouth daily.    [provider]  azithromycin (ZITHROMAX) 250 MG tablet 2 tabs po on day one, then one tablet po once daily on days 2-5. 09/15/17   Janne Napoleon, NP  cefdinir (OMNICEF) 300 MG capsule Take 1 capsule (300 mg total) by mouth 2 (two) times daily. 09/15/17   Janne Napoleon, NP  Fluticasone-Salmeterol (ADVAIR DISKUS) 250-50 MCG/DOSE AEPB Inhale 1 puff into the lungs 2 (two) times daily. 05/21/17 05/21/18  Lacroce, Hulen Shouts, MD  guaiFENesin-codeine (CHERATUSSIN AC) 100-10 MG/5ML syrup Take 5 mL by mouth every 4-6 hours when necessary cough. 09/15/17   Janne Napoleon, NP  hyoscyamine (LEVSIN SL) 0.125 MG SL tablet Place 2 tablets (0.25 mg total) under the tongue daily  as needed for cramping. 01/31/17   Dellia Nims, MD  loratadine (CLARITIN) 10 MG tablet Take 10 mg by mouth daily.    [provider]  mometasone (NASONEX) 50 MCG/ACT nasal spray Place 2 sprays in each nostril once daily as needed 03/26/17   Ledell Noss, MD  Multiple Vitamin (MULTIVITAMIN) capsule Take 1 capsule by mouth daily.    [provider]  Probiotic Product (PROBIOTIC DAILY PO) Take by mouth.    [provider]    Family History Family History  Problem Relation Age of Onset  . Hypertension Mother   . Hyperlipidemia Mother   . Heart disease Mother   . Diabetes Mother   . Kidney disease Mother   . Stroke Father   . Diabetes Father   . Heart  disease Father   . Hyperlipidemia Father   . Hypertension Father   . Kidney disease Father   . Cancer Sister   . Mental illness Sister   . Diabetes Sister   . Kidney disease Sister   . Hypertension Sister   . Mental illness Brother   . Asthma Son   . Hypertension Maternal Aunt   . Hypertension Maternal Uncle   . Hypertension Paternal Aunt   . Kidney disease Paternal Aunt   . Hypertension Paternal Uncle   . Kidney disease Paternal Uncle     Social History Social History   Tobacco Use  . Smoking status: Former Smoker    Packs/day: 0.33    Years: 43.00    Pack years: 14.19    Types: Cigarettes    Last attempt to quit: 01/06/2013    Years since quitting: 4.6  . Smokeless tobacco: Never Used  Substance Use Topics  . Alcohol use: No    Alcohol/week: 0.0 oz  . Drug use: No     Allergies   Penicillins   Review of Systems Review of Systems  Constitutional: Positive for activity change, appetite change and fatigue.  HENT: Negative.   Respiratory: Positive for cough.   Cardiovascular: Negative for chest pain.  Gastrointestinal: Negative.   Neurological: Negative.   All other systems reviewed and are negative.    Physical Exam Triage Vital Signs ED Triage Vitals  Enc Vitals Group     BP 09/15/17 1415 133/64     Pulse Rate 09/15/17 1415 82     Resp 09/15/17 1415 (!) 22     Temp 09/15/17 1415 98.4 F (36.9 C)     Temp Source 09/15/17 1415 Oral     SpO2 09/15/17 1415 98 %     Weight --      Height --      Head Circumference --      Peak Flow --      Pain Score 09/15/17 1412 8     Pain Loc --      Pain Edu? --      Excl. in [place]? --    No data found.  Updated Vital Signs BP 133/64 (BP Location: Left Arm)   Pulse 82   Temp 98.4 F (36.9 C) (Oral)   Resp (!) 22   SpO2 98%   Visual Acuity Right Eye Distance:   Left Eye Distance:   Bilateral Distance:    Right Eye Near:   Left Eye Near:    Bilateral Near:     Physical Exam  Constitutional: She is  oriented to person, place, and time. She appears well-developed and well-nourished.  HENT:  Head: Normocephalic and atraumatic.  Bilateral TMs are normal. Oropharynx is clear and moist.  Eyes: EOM are normal.  Neck: Normal range of motion. Neck supple.  Cardiovascular: Normal rate, regular rhythm and normal heart sounds.  Pulmonary/Chest: Effort normal.  Few crackles intermixed with short wheezes in the left lobe base. Similar sounds in the right lower base but less than left.  Musculoskeletal: Normal range of motion.  Lymphadenopathy:    She has no cervical adenopathy.  Neurological: She is alert and oriented to person, place, and time.  Skin: Skin is warm and dry.  Nursing note and vitals reviewed.    UC Treatments / Results  Labs (all labs ordered are listed, but only abnormal results are displayed) Labs Reviewed  POCT I-STAT, CHEM 8 - Abnormal; Notable for the following components:      Result Value   Glucose, Bld 103 (*)    Hemoglobin 11.6 (*)    HCT 34.0 (*)    All other components within normal limits    EKG  EKG Interpretation None       Radiology Dg Chest 2 View  Result Date: 09/15/2017 CLINICAL DATA:  Abnormal breath sounds.  Cough and fever. EXAM: CHEST  2 VIEW COMPARISON:  10/27/2013 chest radiograph. FINDINGS: Stable cardiomediastinal silhouette with normal heart size. No pneumothorax. No pleural effusion. Mild patchy left retrocardiac opacity. No pulmonary edema . IMPRESSION: Mild patchy retrocardiac left lower lobe opacity suggesting pneumonia. Recommend follow-up PA and lateral post treatment chest radiographs in 4-6 weeks. Electronically Signed   By: Ilona Sorrel M.D.   On: 09/15/2017 14:58    Procedures Procedures (including critical care time)  Medications Ordered in UC Medications - No data to display   Initial Impression / Assessment and Plan / UC Course  I have reviewed the triage vital signs and the nursing notes.  Pertinent labs & imaging  results that were available during my care of the patient were reviewed by me and considered in my medical decision making (see chart for details).    Take the medication as prescribed. Follow-up with your doctor this week. For any acute worsening, trouble breathing, high fevers seek medical attention promptly. May return or go to emergency department.    Final Clinical Impressions(s) / UC Diagnoses   Final diagnoses:  Community acquired pneumonia of left lower lobe of lung [facility])    ED Discharge Orders        Ordered    azithromycin (ZITHROMAX) 250 MG tablet     09/15/17 1518    cefdinir (OMNICEF) 300 MG capsule  2 times daily     09/15/17 1518    guaiFENesin-codeine (CHERATUSSIN AC) 100-10 MG/5ML syrup     09/15/17 1518       Controlled Substance Prescriptions [place] Controlled Substance Registry consulted? No   Janne Napoleon, NP 09/15/17 [id number]

## 2017-09-15 NOTE — Discharge Instructions (Signed)
Take the medication as prescribed. Follow-up with your doctor this week. For any acute worsening, trouble breathing, high fevers seek medical attention promptly. May return or go to emergency department.

## 2017-09-15 NOTE — ED Triage Notes (Signed)
Onset of symptoms was 12/24.  Initially a scratchy throat.  Patient complains of weakness, joint pain, no energy, no appetite, frequent cough, reports bloody streaks in phlegm, and headaches

## 2017-09-18 ENCOUNTER — Ambulatory Visit (INDEPENDENT_AMBULATORY_CARE_PROVIDER_SITE_OTHER): Payer: Self-pay | Admitting: Internal Medicine

## 2017-09-18 ENCOUNTER — Other Ambulatory Visit: Payer: Self-pay

## 2017-09-18 ENCOUNTER — Encounter: Payer: Self-pay | Admitting: Internal Medicine

## 2017-09-18 VITALS — BP 116/65 | HR 93 | Temp 98.5°F | Ht 63.6 in | Wt 148.5 lb

## 2017-09-18 DIAGNOSIS — K589 Irritable bowel syndrome without diarrhea: Secondary | ICD-10-CM

## 2017-09-18 DIAGNOSIS — Z87891 Personal history of nicotine dependence: Secondary | ICD-10-CM

## 2017-09-18 DIAGNOSIS — K581 Irritable bowel syndrome with constipation: Secondary | ICD-10-CM

## 2017-09-18 DIAGNOSIS — J45909 Unspecified asthma, uncomplicated: Secondary | ICD-10-CM

## 2017-09-18 DIAGNOSIS — Z803 Family history of malignant neoplasm of breast: Secondary | ICD-10-CM

## 2017-09-18 DIAGNOSIS — R634 Abnormal weight loss: Secondary | ICD-10-CM

## 2017-09-18 DIAGNOSIS — R911 Solitary pulmonary nodule: Secondary | ICD-10-CM

## 2017-09-18 DIAGNOSIS — Z6825 Body mass index (BMI) 25.0-25.9, adult: Secondary | ICD-10-CM

## 2017-09-18 DIAGNOSIS — Z90710 Acquired absence of both cervix and uterus: Secondary | ICD-10-CM

## 2017-09-18 DIAGNOSIS — J189 Pneumonia, unspecified organism: Secondary | ICD-10-CM | POA: Insufficient documentation

## 2017-09-18 DIAGNOSIS — R7611 Nonspecific reaction to tuberculin skin test without active tuberculosis: Secondary | ICD-10-CM

## 2017-09-18 DIAGNOSIS — J181 Lobar pneumonia, unspecified organism: Principal | ICD-10-CM

## 2017-09-18 MED ORDER — ALBUTEROL SULFATE HFA 108 (90 BASE) MCG/ACT IN AERS: 2.0000 | INHALATION_SPRAY | Freq: Four times a day (QID) | RESPIRATORY_TRACT | 6 refills | Status: DC | PRN

## 2017-09-18 NOTE — Assessment & Plan Note (Addendum)
Patient endorses subjective weight loss that she states was sudden onset about 2 months ago. She thinks it might be related to her IBS as she has still not found a diet that she can consistently tolerate. She denies abd pain, n/v, melena, hematochezia, vaginal bleeding, or changes to her breasts. She has had a normal mammogram in Oct; she reports a previous breast biopsy that was benign; both her sisters have a history of breast cancer; she had a hysterectomy for fibroids and menorrhagia years ago.   Etiology could be depression/anxiety (high correlation in patients with IBS), malignancy (h/o smoking, quit 6 years ago; h/o breast cancer in two 1st degree relatives but had normal mammogram in Oct; h/o colonic polyps - due for repeat colo in 3/19 per GI notes). She also has notes of +TB test with intolerance to [place] therapy; CXRs have been negative; from personal review it looks like she has a stable left perihilar nodule on most recent CXR 12/30 compared to 2015.  From chart review, it appears she has lost ~10lbs since September.  Plan: --patient to f/u with GI for repeat colo --f/u with PCP once she is over acute illness for further discussion and evaluation of weight loss

## 2017-09-18 NOTE — Patient Instructions (Signed)
Continue your azithromycin and cefdinir as prescribed. I have given you a prescription for albuterol (rescue inhaler) to use as needed for shortness of breath and wheezing; continue your advair twice daily as before.   If you don't improve or start feeling worse over the next 7-10 days, please come back for re-evaluation. With the above therapy, I do expect that you'll start feeling improvement in the next few days.   Please make an appointment with your PCP, Dr. Hetty Ely to be seen in about 6 weeks for better evaluation of your weight loss once you are over your pneumonia.

## 2017-09-18 NOTE — Progress Notes (Signed)
SATURATION QUALIFICATIONS:  Patient Saturations on Room Air at Rest: 99% Pulse:95  Patient Saturations on Room Air while Ambulating: 98% Pulse:107

## 2017-09-18 NOTE — Assessment & Plan Note (Signed)
Patient follows with [facility]. She has to reschedule her appt with them from today due to current illness. She wishes to meet with nutritionist to help her find a diet that she is able to tolerate better.   Plan: --referral to nutritionist placed

## 2017-09-18 NOTE — Assessment & Plan Note (Signed)
Patient seen at urgent care for productive cough, chills, subjective fevers and found to have left lower lobe opacity with PE findings consistent with pneumonia. She was started on azithromycin and cefdinir for CAP coverage. During the urgent care visit, it was noted that she had significant wheezing associated with left sided rales. Today patient states she is still having a productive cough but is not having blood tinged sputum any longer; she denies feeling feverish since starting antibiotics; she endorses wheezing that is worse at night; she endorses exertional shortness of breath, general weakness, appetite loss. She has been hydrating well. She denies nausea, vomiting, abd pain. For wheezing she has been using her Advair inhaler 4 times daily; she has not tried albuterol.   Exam with persistent mild left sided rales, no wheezing. Speaking in complete sentences. Ambulated with pulse ox to O2 of 98% on RA.  Plan: --continue azithro and cefdinir to complete course --refilled albuterol to use PRN for wheezing --advised to RTC if symptoms worsen or don't improve in 7-10 days --in setting of weight loss (~10lbs) and h/o smoking, could consider repeat CXR in 4-6 weeks for resolution and to screen for pulmonary malignancy; asked patient to schedule f/u with PCP in this time-frame

## 2017-09-18 NOTE — Progress Notes (Signed)
   CC: cough  HPI:  Aimee Davis is a 62 y.o. with a PMH of asthma, irritable bowel syndrome presenting to clinic for f/u after diagnosis of pneumonia.  Community acquired pneumonia: Patient seen at urgent care for productive cough, chills, subjective fevers and found to have left lower lobe opacity with PE findings consistent with pneumonia. She was started on azithromycin and cefdinir for CAP coverage. During the urgent care visit, it was noted that she had significant wheezing associated with left sided rales. Today patient states she is still having a productive cough but is not having blood tinged sputum any longer; she denies feeling feverish since starting antibiotics; she endorses wheezing that is worse at night; she endorses exertional shortness of breath, general weakness, appetite loss. She has been hydrating well. She denies nausea, vomiting, abd pain. For wheezing she has been using her Advair inhaler 4 times daily; she has not tried albuterol.   Weight loss: Patient endorses subjective weight loss that she states was sudden onset about 2 months ago. She thinks it might be related to her IBS as she has still not found a diet that she can consistently tolerate. She denies abd pain, n/v, melena, hematochezia, vaginal bleeding, or changes to her breasts. She has had a normal mammogram in Oct; she reports a previous breast biopsy that was benign; both her sisters have a history of breast cancer; she had a hysterectomy for fibroids and menorrhagia years ago.   Please see problem based Assessment and Plan for status of patients chronic conditions.  Past Medical History:  Diagnosis Date  . Allergy    seasonal, PCN, antidepressants, bentyl  . Anemia   . Asthma   . Carpal tunnel syndrome on both sides    right worse than left 2008  . GERD (gastroesophageal reflux disease)   . Hyperlipidemia    LDL 108 JULY 2013  . Positive TB test    From MArch 2009 note : Recent F/u at [facility].  CXRAy negative.  Positive TST in 2007 (19mm).  Unable to tolerate INH due to hepatotoxicity    Review of Systems:   ROS Per HPI  Physical Exam:  Vitals:   09/18/17 1121  BP: 116/65  Pulse: 93  Temp: 98.5 F (36.9 C)  TempSrc: Oral  SpO2: 100%  Weight: 148 lb 8 oz (67.4 kg)  Height: 5' 3.6" (1.615 m)   GENERAL- alert, co-operative, appears as stated age, not in any distress. HEENT- Atraumatic, normocephalic, EOMI, oral mucosa appears moist, mild bil ant cervical lymphadenopathy. No axillary lymphadenopathy. CARDIAC- RRR, no murmurs, rubs or gallops. RESP- rales on left lower lung field otherwise clear to auscultation bilaterally; no wheezing; able to speak in full sentences. ABDOMEN- Soft, nontender, bowel sounds present.  Assessment & Plan:   See Encounters Tab for problem based charting.   Patient discussed with Dr. Abelardo Diesel, MD Internal Medicine PGY2

## 2017-09-20 ENCOUNTER — Other Ambulatory Visit: Payer: Self-pay | Admitting: *Deleted

## 2017-09-20 NOTE — Progress Notes (Signed)
Internal Medicine Clinic Attending  Case discussed with Dr. Svalina  at the time of the visit.  We reviewed the resident's history and exam and pertinent patient test results.  I agree with the assessment, diagnosis, and plan of care documented in the resident's note.  

## 2017-09-21 MED ORDER — MOMETASONE FUROATE 50 MCG/ACT NA SUSP: NASAL | 6 refills | Status: DC

## 2017-10-03 ENCOUNTER — Telehealth: Payer: Self-pay | Admitting: Internal Medicine

## 2017-10-03 NOTE — Telephone Encounter (Signed)
RETURNED CALL TO PATIENT, CONCERNED SHE HAS NOT RECEIVED HER CAFA LETTER FROM PT. [facility] YET. ADVISED PT. I CONTACTED S. WEBB TO LOOK OVER HER APP AND MAIL LETTER OUT.

## 2017-10-04 ENCOUNTER — Encounter: Payer: Self-pay | Admitting: Dietician

## 2017-10-04 ENCOUNTER — Ambulatory Visit (INDEPENDENT_AMBULATORY_CARE_PROVIDER_SITE_OTHER): Payer: Self-pay | Admitting: Dietician

## 2017-10-04 DIAGNOSIS — R634 Abnormal weight loss: Secondary | ICD-10-CM

## 2017-10-04 DIAGNOSIS — K589 Irritable bowel syndrome without diarrhea: Secondary | ICD-10-CM

## 2017-10-04 NOTE — Patient Instructions (Signed)
What we talked about today:   Increasing  Your calories and protein intake right now to stabilize your weight- goal is no more loss and at least stay the same or maybe gain. See below for some ideas  The plan:   Start taking psyllium Husk 1 table spoon every day in 8 oz of fluid  I recommend fluids be juices, ensure, almond milk   Call anytime with questions or concerns  Debera Lat Diabetes Educator [phone]   High-Protein and High-Calorie Diet Eating high-protein and high-calorie foods can help you to gain weight, heal after an injury, and recover after an illness or surgery. What is my plan? The specific amount of daily protein and calories you need depends on:  Your body weight.  The reason this diet is recommended for you.  Generally, a high-protein, high-calorie diet involves:  Eating 250-500 extra calories each day.  Making sure that 10-35% of your daily calories come from protein.  Talk to your health care provider about how much protein and how many calories you need each day. Follow the diet as directed by your health care provider. What do I need to know about this diet?  Ask your health care provider if you should take a nutritional supplement.  Try to eat six small meals each day instead of three large meals.  Eat a balanced diet, including one food that is high in protein at each meal.  Keep nutritious snacks handy, such as nuts, trail mixes, dried fruit, and yogurt.  If you have kidney disease or diabetes, eating too much protein may put extra stress on your kidneys. Talk to your health care provider if you have either of those conditions. What are some high-protein foods? Grains Quinoa. Bulgur wheat. Vegetables Soybeans. Peas. Meats and Other Protein Sources Beef, pork, and poultry. Fish and seafood. Eggs. Tofu. Textured vegetable protein (TVP). Peanut butter. Nuts and seeds. Dried beans. Protein powders. Dairy Whole milk. Whole-milk yogurt.  Powdered milk. Cheese. [employer]. Eggnog. Beverages High-protein supplement drinks. Soy milk. Other Protein bars. The items listed above may not be a complete list of recommended foods or beverages. Contact your dietitian for more options. What are some high-calorie foods? Grains Pasta. Quick breads. Muffins. Pancakes. Ready-to-eat cereal. Vegetables Vegetables cooked in oil or butter. Fried potatoes. Fruits Dried fruit. Fruit leather. Canned fruit in syrup. Fruit juice. Avocados. Meats and Other Protein Sources Peanut butter. Nuts and seeds. Dairy Heavy cream. Whipped cream. Cream cheese. Sour cream. Ice cream. Custard. Pudding. Beverages Meal-replacement beverages. Nutrition shakes. Fruit juice. Sugar-sweetened soft drinks. Condiments Salad dressing. Mayonnaise. Alfredo sauce. Fruit preserves or jelly. Honey. Syrup. Sweets/Desserts Cake. Cookies. Pie. Pastries. Candy bars. Chocolate. Fats and Oils Butter or margarine. Oil. Gravy. Other Meal-replacement bars. The items listed above may not be a complete list of recommended foods or beverages. Contact your dietitian for more options. What are some tips for including high-protein and high-calorie foods in my diet?  Add whole milk, half-and-half, or heavy cream to cereal, pudding, soup, or hot cocoa.  Add whole milk to instant breakfast drinks.  Add peanut butter to oatmeal or smoothies.  Add powdered milk to baked goods, smoothies, or milkshakes.  Add powdered milk, cream, or butter to mashed potatoes.  Add cheese to cooked vegetables.  Make whole-milk yogurt parfaits. Top them with granola, fruit, or nuts.  Add cottage cheese to your fruit.  Add avocados, cheese, or both to sandwiches or salads.  Add meat, poultry, or seafood to rice, pasta, casseroles,  salads, and soups.  Use mayonnaise when making egg salad, chicken salad, or tuna salad.  Use peanut butter as a topping for pretzels, celery, or  crackers.  Add beans to casseroles, dips, and spreads.  Add pureed beans to sauces and soups.  Replace calorie-free drinks with calorie-containing drinks, such as milk and fruit juice. This information is not intended to replace advice given to you by your health care provider. Make sure you discuss any questions you have with your health care provider. Document Released: 09/03/2005 Document Revised: 02/09/2016 Document Reviewed: 02/16/2014 Elsevier Interactive Patient Education  [employer].

## 2017-10-04 NOTE — Telephone Encounter (Signed)
Aimee Davis missed her appointment earlier today,

## 2017-10-04 NOTE — Progress Notes (Signed)
  Medical Nutrition Therapy:  Appt start time: 4128 end time:  1630. Visit # 1  Assessment:  Primary concerns today: unintentional weight loss.  Aimee Davis has documented 16# weight loss in the past 6 months unintentionally. She has a history of IBS x 20 years and limits wheat, lactose, some fruits and many vegetables. She has 3 BMs a week that are often hard and lumpy. When she has flare ups ( pain bloating and gas early mornings) she puts her self on a low calorie clear liquid diet.  She likes to exercise but is not currently as she reports she is still recovering from pneumonia and moved farther from the [facility] where [facility] sw[facility]. She likes to walk, but not in the cold. Her activity is very limited right now. Preferred Learning Style: No preference indicated  Learning Readiness: Ready ANTHROPOMETRICS: weight -145.4#, height-63.5",  BMI-25 WEIGHT HISTORY: 03/2017 she was: 161.4#  To today she is the Lowest at 145.4#. Her highest weight on her chart was 166.6# in 2015 SLEEP:sher reports normal sleep pattern  MEDICATIONS: bentyl, she takes a probiotic 3x/week, just started vitamin C 50 mg twice daily, B complex daily  DIETARY INTAKE: Usual eating pattern includes 2 meals and 1 snacks per day.   24-hr recall:  B ( 10-11AM): 1-2 hard boiled eggs, oatmeal with fruit or this am a smoothie with almond milk, fruit, flax seed and protein powder L ( 3:30 PM): banana or yogurt with flaxseed, sometimes a [place] sandwich D ( 6 PM): salmon, potatoes, water Snk (9-10   PM): gets very hungry and sometimes eats, but finds that makes her early am cramping worse Beverages: water, almond milk, some juice  Estimated daily energy needs: 1500-1800 calories 70-80 g protein   Progress Towards Goal(s):  In progress.   Nutritional Diagnosis:  [place]-3.2 Unintentional weight loss As related to increased needs from illness and decreased intake from illness and self limits to try to improve her IBS symptoms.   As evidenced by her documented weights and report.    Intervention:  Nutrition education about weight maintenance with a high calorie and high protein diet, decrease constipation symptoms  With increased soluble fiber. Importance to resumption of physical activity for bowel health and weight lifting for muscle regain. . Coordination of care: none  Teaching Method Utilized: Visual,,Auditory,Hands on Handouts given during visit include: AVS  Barriers to learning/adherence to lifestyle change: competing values Demonstrated degree of understanding via:  Teach Back   Monitoring/Evaluation:  Dietary intake, exercise, and body weight in 3 day(s). Aimee Davis, RD 10/04/2017 5:44 PM.

## 2017-10-14 ENCOUNTER — Ambulatory Visit: Payer: Self-pay | Admitting: Dietician

## 2017-10-16 ENCOUNTER — Other Ambulatory Visit: Payer: Self-pay | Admitting: *Deleted

## 2017-10-16 MED ORDER — FLUTICASONE-SALMETEROL 250-50 MCG/DOSE IN AEPB: 1.0000 | INHALATION_SPRAY | Freq: Two times a day (BID) | RESPIRATORY_TRACT | 6 refills | Status: DC

## 2017-10-16 NOTE — Telephone Encounter (Signed)
Received faxed refill request from [facility] for Advair and nasonex.. Nasonex rx on file, but never received by phamacy-faxed refill authorization to pharmacy.  Will send advair to pcp for refill.Aimee Davis, [name]1/30/201910:42 AM

## 2017-10-17 NOTE — Addendum Note (Signed)
Addended by: Ebbie Latus on: 10/17/2017 09:32 AM   Modules accepted: Orders

## 2017-10-17 NOTE — Telephone Encounter (Signed)
Dawn, pharmacist at [facility] stated Advair diskus  A one month supply is 60 not 14. Please re-send rx thanks.

## 2017-10-19 MED ORDER — FLUTICASONE-SALMETEROL 250-50 MCG/DOSE IN AEPB: 1.0000 | INHALATION_SPRAY | Freq: Two times a day (BID) | RESPIRATORY_TRACT | 6 refills | Status: DC

## 2017-10-25 ENCOUNTER — Ambulatory Visit (INDEPENDENT_AMBULATORY_CARE_PROVIDER_SITE_OTHER): Payer: Self-pay | Admitting: Dietician

## 2017-10-25 DIAGNOSIS — Z6825 Body mass index (BMI) 25.0-25.9, adult: Secondary | ICD-10-CM

## 2017-10-25 DIAGNOSIS — R634 Abnormal weight loss: Secondary | ICD-10-CM

## 2017-10-25 DIAGNOSIS — Z713 Dietary counseling and surveillance: Secondary | ICD-10-CM

## 2017-10-25 DIAGNOSIS — R109 Unspecified abdominal pain: Secondary | ICD-10-CM

## 2017-10-25 NOTE — Patient Instructions (Signed)
Please write down every thing your eat, drink and pills, medicine, pysllium husk, gum for at least 3 days- prefer two weekdays and one weekend day but 7 days is ideal.   Keep me posted on the gum goal for quitting by Easter.

## 2017-10-25 NOTE — Progress Notes (Signed)
  Medical Nutrition Therapy:  Appt start time: 7078 end time:  6754. Visit # 2  Assessment:  Primary concerns today: unintentional weight loss.  Ms. Quilling has documented 16# weight loss in the past 6 months unintentionally.  She reports she is feeling a little better. She started the psyllium husk with fair results afterl she increased it to 1.5 tablespoons/day. She feels she may need the 2 tablespoons. Her weight is stable today after she began drinking one ensure a day and using juice and almond milk for water. She also mentioned today that she wonders if the nicotine gum that she chews daily 9 x5 years since she quit smoking) could be part of her stomach problem. She reports most of her abdominal pain wakes her from sleep ~ 5am daily.  She is seeing a gastroenterologist concurrently.  ANTHROPOMETRICS: weight -145.6#, height-63.5",  BMI-25  Estimated daily energy needs: 1500-1800 calories 70-80 g protein  Progress Towards Goal(s):  Some progress.   Nutritional Diagnosis:  [place]-3.2 Unintentional weight loss As related to increased needs from illness and decreased intake from illness and self limits to try to improve her IBS symptoms.  As evidenced by her documented weights and report.    Intervention:  Nutrition education about appropriateness of current weight, keeping food diary to include nicotine gum, psyllium husk and symptoms. Assisted with motivational intervieing for her self stated goal of quitting the nicotine gum.  Coordination of care: none  Teaching Method Utilized: Visual,,Auditory,Hands on Handouts given during visit include: AVS  Barriers to learning/adherence to lifestyle change: competing values Demonstrated degree of understanding via:  Teach Back   Monitoring/Evaluation:  Dietary intake, exercise, and body weight in 4 week(s). Debera Lat, RD 10/25/2017 4:33 PM.

## 2017-10-28 ENCOUNTER — Encounter: Payer: Self-pay | Admitting: Internal Medicine

## 2017-11-22 ENCOUNTER — Ambulatory Visit: Payer: Self-pay | Admitting: Dietician

## 2017-11-29 ENCOUNTER — Ambulatory Visit (INDEPENDENT_AMBULATORY_CARE_PROVIDER_SITE_OTHER): Payer: Self-pay | Admitting: Dietician

## 2017-11-29 DIAGNOSIS — Z6825 Body mass index (BMI) 25.0-25.9, adult: Secondary | ICD-10-CM

## 2017-11-29 DIAGNOSIS — R634 Abnormal weight loss: Secondary | ICD-10-CM

## 2017-11-29 DIAGNOSIS — Z713 Dietary counseling and surveillance: Secondary | ICD-10-CM

## 2017-11-29 NOTE — Progress Notes (Signed)
  Medical Nutrition Therapy:  Appt start time: 0915 end time:  0945. Visit # 3  Assessment:  Primary concerns today: unintentional weight loss.  Ms. Corona has documented 16# weight loss in the past 6 months unintentionally. Her weight has stayed in a healthy range for her height and age. She has now regained 3#.  She reports she is feeling a little better, has started exercising with a freind on treadmill and lifting weight several times a week. She is still chewing the nicotine gum but has reduced the amount, still having problems with constipation. She reports most of her abdominal pain wakes her from sleep ~ 5am daily and is now intermittent and she feels it is maybe food dependent.  ANTHROPOMETRICS: weight -148.6#, height-63.5",  BMI-25.83  Estimated daily energy needs: 1500-1800 calories 70-80 g protein  Progress Towards Goal(s):  Some progress.   Nutritional Diagnosis:  [place]-3.2 Unintentional weight loss As related to increased needs from illness and decreased intake from illness and self limits to try to improve her IBS symptoms is improving  As evidenced by her documented weights and report.    Intervention:  Nutrition education about appropriateness of current weight, appropriate exercise and  an elimination diet for her IBS symptoms.   Coordination of care: none  Teaching Method Utilized: Visual,,Auditory,Hands on Handouts given during visit include: AVS  Barriers to learning/adherence to lifestyle change: competing values Demonstrated degree of understanding via:  Teach Back   Monitoring/Evaluation:  Dietary intake, exercise, and body weight in 4 week(s). Debera Lat, RD 11/29/2017 11:40 AM.

## 2017-11-29 NOTE — Patient Instructions (Signed)
Your Exercise Prescription:   RX:  At least 150 minutes a week of moderate intensity exercise. Walking, bicycling, stationary bicycling, dancing.  (moderate intensity means to get  a little out of breath)  Do resistance exercise at least 2 times a week.  This can be yoga poses or strength training where you lift your own weight (think leg lifts or toe raises)  or light weights like cans of beans with your arms.   Flexibility and balance exercise- safe stretching and practicing balance is very important to health.    List the good things and the bad things that come from chewing the nicotine gum  List the good things and bad things that may come from not chewing the nicotine gum.   Your weight is good and is showing an increase. It is fine where it at 148# and may be healthy at 154# if what you gain is muscle.   Food is better for you than ensure. So you can try to decrease how often  You are needing Ensure.   For constipation- try to be sure you are drinking 48-60 ounces of fluids a day.  Water, coffee, tea, milk, juice, sparkling water.   If still a problem, you could consider a stool softener every day.

## 2017-12-02 ENCOUNTER — Telehealth: Payer: Self-pay | Admitting: Dietician

## 2017-12-02 NOTE — Telephone Encounter (Signed)
Called asking if okay to use organic protein powder. I told her food is always safer than supplements which that would be considered. However, is she was going to use it in a smoothie and as a meal replacement that had no other source of good protein then it should be fine to use in that way.

## 2017-12-25 ENCOUNTER — Ambulatory Visit: Payer: Self-pay

## 2017-12-31 ENCOUNTER — Ambulatory Visit (HOSPITAL_COMMUNITY)
Admission: RE | Admit: 2017-12-31 | Discharge: 2017-12-31 | Disposition: A | Discharge status: Home / Self Care | Payer: Self-pay | Source: Ambulatory Visit | Attending: Internal Medicine | Admitting: Internal Medicine

## 2017-12-31 ENCOUNTER — Ambulatory Visit (INDEPENDENT_AMBULATORY_CARE_PROVIDER_SITE_OTHER): Payer: Self-pay | Admitting: Internal Medicine

## 2017-12-31 ENCOUNTER — Other Ambulatory Visit: Payer: Self-pay

## 2017-12-31 ENCOUNTER — Encounter: Payer: Self-pay | Admitting: Internal Medicine

## 2017-12-31 VITALS — BP 124/66 | HR 65 | Temp 98.0°F | Ht 63.5 in | Wt 148.9 lb

## 2017-12-31 DIAGNOSIS — Z79899 Other long term (current) drug therapy: Secondary | ICD-10-CM

## 2017-12-31 DIAGNOSIS — K581 Irritable bowel syndrome with constipation: Secondary | ICD-10-CM

## 2017-12-31 DIAGNOSIS — Z8742 Personal history of other diseases of the female genital tract: Secondary | ICD-10-CM

## 2017-12-31 DIAGNOSIS — D709 Neutropenia, unspecified: Secondary | ICD-10-CM

## 2017-12-31 DIAGNOSIS — Z90710 Acquired absence of both cervix and uterus: Secondary | ICD-10-CM

## 2017-12-31 DIAGNOSIS — Z86010 Personal history of colonic polyps: Secondary | ICD-10-CM

## 2017-12-31 DIAGNOSIS — J4540 Moderate persistent asthma, uncomplicated: Secondary | ICD-10-CM

## 2017-12-31 DIAGNOSIS — Z8611 Personal history of tuberculosis: Secondary | ICD-10-CM

## 2017-12-31 DIAGNOSIS — Z8701 Personal history of pneumonia (recurrent): Secondary | ICD-10-CM

## 2017-12-31 DIAGNOSIS — R634 Abnormal weight loss: Secondary | ICD-10-CM

## 2017-12-31 DIAGNOSIS — J45909 Unspecified asthma, uncomplicated: Secondary | ICD-10-CM

## 2017-12-31 DIAGNOSIS — Z6825 Body mass index (BMI) 25.0-25.9, adult: Secondary | ICD-10-CM

## 2017-12-31 DIAGNOSIS — Z7951 Long term (current) use of inhaled steroids: Secondary | ICD-10-CM

## 2017-12-31 NOTE — Patient Instructions (Signed)
Thank you for coming to the clinic today. It was a pleasure to see you.   For your asthma, try using the albuterol inhaler when you feel short of breath.  Please call the clinic if this does not work to improve your symptoms  For your weight loss -we are ordering a more extensive workup of this, be sure to have the endoscopy and colonoscopy scheduled at [facility] When: 3-6 months with Dr. Hetty Ely  For: Follow up of your weight loss  What to bring: all of your medication bottles   Please call our clinic if you have any questions or concerns, we may be able to help and keep you from a long and expensive emergency room wait. Our clinic and after hours phone number is [phone], there is always someone available.

## 2017-12-31 NOTE — Progress Notes (Addendum)
CC: follow up of weight loss, asthma   HPI:  [name] is a 62 y.o. with PMH of asthma who presents for follow up of asthma and weight loss. Please see the assessment and plans for the status of the patient chronic medical problems.   Past Medical History:  Diagnosis Date  . Allergy    seasonal, PCN, antidepressants, bentyl  . Anemia   . Asthma   . Carpal tunnel syndrome on both sides    right worse than left 2008  . GERD (gastroesophageal reflux disease)   . Hyperlipidemia    LDL 108 JULY 2013  . Multiple nevi 03/01/2015  . Positive TB test    From MArch 2009 note : Recent F/u at [facility]. CXRAy negative.  Positive TST in 2007 (72mm).  Unable to tolerate INH due to hepatotoxicity   Review of Systems:  Refer to history of present illness and assessment and plans for pertinent review of systems, all others reviewed and negative  Physical Exam:  Vitals:   12/31/17 1329 12/31/17 1408  BP: (!) 139/58 124/66  Pulse: 67 65  Temp: 98 F (36.7 C)   TempSrc: Oral   SpO2: 99%   Weight: 148 lb 14.4 oz (67.5 kg)   Height: 5' 3.5" (1.613 m)    General: well appearing, no acute distress  HEENT: no cervical LAD, moist mucous membranes  Cardiac: RRR, no murmur, normal S1 and S2, no peripheral edema  Pulm: normal work of breathing, no rhonchi or wheezes  GI: abdomen is soft and non tender  Assessment & Plan:   Weight loss  Neutropenia  [name] has had 13 pounds of unintentional weight loss since July of last year. The weight loss began when she developed CAP in December and at the same time had a worsening of IBS and developed a decreased appetite. Since that time she feels that she still has a decreased appetite and her diet has changed to include healthier foods and she has not gained back the weight.  She has associated lightheadedness, mental fog, cold intollerance, cough productive of white sputum, and ankle pain at night not associated with swelling. She denies  diarrhea, constipation, nausea, or fever.  She had a hysterectomy for fibroids and menorrhagia in the 1980s. Path reports are not available from that time however the patient believes that the the hysterectomy was total. Last vaginal exam in 2011 appears to be a cuff scrapping with endocervical zone absent but had no features of malignancy or dysplasia and was HPV neg. Last mammogram in 2018 was a negative Bi-rads. She has a history of colonic polyps, last colonoscopy in 2014 revealed no polyps but was found to have insufficient prep so follow up in 5 years was recommended. Other pertinent features include a history of positive TB test in 2007.  CBC is remarkable for isolated moderate neutropenia. HIV screening is negative. CMP and TSH are unremarkable. Medication causes include NSAIDS, macrolides, and cephalosporins.   Other differentials for neutropenia include autoimmune disease (such as lupus and RA); viral ( hepatitis, EBV), bacterial, and parasitic infection. Neutropenia can also be a benign finding in healthy, asymptomatic patients of african [place] decent.   - ordered CMP, HIV, TSH, CBC  - follow up chest xray  - colonoscopy is due at this time, she has been evaluated by [facility] and will have colonoscopy and EGD scheduled there  - if colonoscopy is unremarkable and she continues to have this unexplained weight loss, would  proceed with further history for symptoms of autoimmune disease but she does deny rashes or peripheral arthritis today  - will have [name] come in for lab only to repeat CBC to follow up and order save smear   Asthma  Symptoms are overall controlled with fluticasone - salmeterol however at times while walking or exercising she does experience shortness of breath and have to rest. She notices that these symptoms become worse with changes in season so she has been taking loratadine. She has not tried to use albuterol for these exacerbations of DOE but does have an albuterol  inhaler and would like to try this.  - continue loratadine for seasonal allergies.  - continue fluticasone - salmeterol 250-50 BID   IBS - c  She continues to follow with [facility]. She has adopted a gluten free diet and symptoms are controlled at this time.  - continue levsion PRN  See Encounters Tab for problem based charting.  Patient discussed with Dr. Lynnae January

## 2018-01-01 LAB — CBC WITH DIFFERENTIAL/PLATELET
Basophils Absolute: 0 10*3/uL (ref 0.0–0.2)
Basos: 1 %
EOS (ABSOLUTE): 0 10*3/uL (ref 0.0–0.4)
Eos: 2 %
HEMOGLOBIN: 11.7 g/dL (ref 11.1–15.9)
Hematocrit: 35.7 % (ref 34.0–46.6)
IMMATURE GRANULOCYTES: 0 %
Immature Grans (Abs): 0 10*3/uL (ref 0.0–0.1)
LYMPHS ABS: 0.7 10*3/uL (ref 0.7–3.1)
Lymphs: 34 %
MCH: 27 pg (ref 26.6–33.0)
MCHC: 32.8 g/dL (ref 31.5–35.7)
MCV: 82 fL (ref 79–97)
Monocytes Absolute: 0.2 10*3/uL (ref 0.1–0.9)
Monocytes: 8 %
Neutrophils Absolute: 1.2 10*3/uL — ABNORMAL LOW (ref 1.4–7.0)
Neutrophils: 55 %
Platelets: 217 10*3/uL (ref 150–379)
RBC: 4.33 x10E6/uL (ref 3.77–5.28)
RDW: 13.9 % (ref 12.3–15.4)
WBC: 2 10*3/uL — AB (ref 3.4–10.8)

## 2018-01-01 LAB — CMP14 + ANION GAP
ALBUMIN: 4.3 g/dL (ref 3.6–4.8)
ALK PHOS: 63 IU/L (ref 39–117)
ALT: 18 IU/L (ref 0–32)
ANION GAP: 13 mmol/L (ref 10.0–18.0)
AST: 20 IU/L (ref 0–40)
Albumin/Globulin Ratio: 2.7 — ABNORMAL HIGH (ref 1.2–2.2)
BUN / CREAT RATIO: 14 (ref 12–28)
BUN: 13 mg/dL (ref 8–27)
Bilirubin Total: 0.3 mg/dL (ref 0.0–1.2)
CO2: 25 mmol/L (ref 20–29)
CREATININE: 0.92 mg/dL (ref 0.57–1.00)
Calcium: 9.7 mg/dL (ref 8.7–10.3)
Chloride: 105 mmol/L (ref 96–106)
GFR calc non Af Amer: 66 mL/min/{1.73_m2} (ref >59)
GFR, EST AFRICAN AMERICAN: 77 mL/min/{1.73_m2} (ref >59)
Globulin, Total: 1.6 g/dL (ref 1.5–4.5)
Glucose: 83 mg/dL (ref 65–99)
Potassium: 4.5 mmol/L (ref 3.5–5.2)
Sodium: 143 mmol/L (ref 134–144)
TOTAL PROTEIN: 5.9 g/dL — AB (ref 6.0–8.5)

## 2018-01-01 LAB — TSH: TSH: 1.89 u[IU]/mL (ref 0.450–4.500)

## 2018-01-01 LAB — HIV ANTIBODY (ROUTINE TESTING W REFLEX): HIV Screen 4th Generation wRfx: NONREACTIVE

## 2018-01-02 ENCOUNTER — Encounter: Payer: Self-pay | Admitting: Internal Medicine

## 2018-01-02 MED ORDER — FLUTICASONE-SALMETEROL 250-50 MCG/DOSE IN AEPB
1.0000 | INHALATION_SPRAY | Freq: Two times a day (BID) | RESPIRATORY_TRACT | 6 refills | Status: DC
Start: 2018-01-02 — End: 2020-01-14

## 2018-01-02 NOTE — Assessment & Plan Note (Signed)
Aimee Davis has had 13 pounds of unintentional weight loss since July of last year. The weight loss began when she developed CAP in December and at the same time had a worsening of IBS and developed a decreased appetite. Since that time she feels that she still has a decreased appetite and her diet has changed to include healthier foods and she has not gained back the weight.  She has associated lightheadedness, mental fog, cold intollerance, cough productive of white sputum, and ankle pain at night not associated with swelling. She denies diarrhea, constipation, nausea, or fever.  She had a hysterectomy for fibroids and menorrhagia in the 1980s. Path reports are not available from that time however the patient believes that the the hysterectomy was total. Last vaginal exam in 2011 appears to be a cuff scrapping with endocervical zone absent but had no features of malignancy or dysplasia and was HPV neg. Last mammogram in 2018 was a negative Bi-rads. She has a history of colonic polyps, last colonoscopy in 2014 revealed no polyps but was found to have insufficient prep so follow up in 5 years was recommended. Other pertinent features include a history of positive TB test in 2007.  CBC is remarkable for isolated moderate neutropenia. HIV screening is negative. CMP and TSH are unremarkable. Medication causes include NSAIDS, macrolides, and cephalosporins.   Other differentials for neutropenia include autoimmune disease (such as lupus and RA); viral ( hepatitis, EBV), bacterial, and parasitic infection. Neutropenia can also be a benign finding in healthy, asymptomatic patients of african [place] decent.   - ordered CMP, HIV, TSH, CBC  - follow up chest xray  - colonoscopy is due at this time, she has been evaluated by [facility] and will have colonoscopy and EGD scheduled there  - if colonoscopy is unremarkable and she continues to have this unexplained weight loss, would proceed with further history for symptoms  of autoimmune disease but she does deny rashes or peripheral arthritis today  - will repeat CBC to follow up neutropenia

## 2018-01-02 NOTE — Assessment & Plan Note (Signed)
She continues to follow with [facility]. She has adopted a gluten free diet and symptoms are controlled at this time.  - continue levsion PRN

## 2018-01-02 NOTE — Assessment & Plan Note (Signed)
Symptoms are overall controlled with fluticasone - salmeterol however at times while walking or exercising she does experience shortness of breath and have to rest. She notices that these symptoms become worse with changes in season so she has been taking loratadine. She has not tried to use albuterol for these exacerbations of DOE but does have an albuterol inhaler and would like to try this.  - continue loratadine for seasonal allergies.  - continue fluticasone - salmeterol 250-50 BID

## 2018-01-06 NOTE — Progress Notes (Signed)
Internal Medicine Clinic Attending  Case discussed with Dr. Blum at the time of the visit.  We reviewed the resident's history and exam and pertinent patient test results.  I agree with the assessment, diagnosis, and plan of care documented in the resident's note. 

## 2018-01-11 NOTE — Addendum Note (Signed)
Addended by: Meryl Dare on: 01/11/2018 02:51 PM   Modules accepted: Orders

## 2018-02-19 ENCOUNTER — Other Ambulatory Visit: Payer: Self-pay

## 2018-02-19 ENCOUNTER — Ambulatory Visit (INDEPENDENT_AMBULATORY_CARE_PROVIDER_SITE_OTHER): Payer: Self-pay | Admitting: Internal Medicine

## 2018-02-19 ENCOUNTER — Encounter: Payer: Self-pay | Admitting: Internal Medicine

## 2018-02-19 VITALS — BP 107/67 | HR 57 | Temp 98.0°F | Ht 63.0 in | Wt 149.0 lb

## 2018-02-19 DIAGNOSIS — J45909 Unspecified asthma, uncomplicated: Secondary | ICD-10-CM

## 2018-02-19 DIAGNOSIS — Z79899 Other long term (current) drug therapy: Secondary | ICD-10-CM

## 2018-02-19 DIAGNOSIS — K581 Irritable bowel syndrome with constipation: Secondary | ICD-10-CM

## 2018-02-19 DIAGNOSIS — D709 Neutropenia, unspecified: Secondary | ICD-10-CM

## 2018-02-19 DIAGNOSIS — R42 Dizziness and giddiness: Secondary | ICD-10-CM

## 2018-02-19 DIAGNOSIS — D803 Selective deficiency of immunoglobulin G [IgG] subclasses: Secondary | ICD-10-CM

## 2018-02-19 DIAGNOSIS — K219 Gastro-esophageal reflux disease without esophagitis: Secondary | ICD-10-CM

## 2018-02-19 DIAGNOSIS — D72819 Decreased white blood cell count, unspecified: Secondary | ICD-10-CM

## 2018-02-19 DIAGNOSIS — D802 Selective deficiency of immunoglobulin A [IgA]: Secondary | ICD-10-CM

## 2018-02-19 LAB — CBC WITH DIFFERENTIAL/PLATELET
Abs Immature Granulocytes: 0 10*3/uL (ref 0.0–0.1)
BASOS PCT: 1 %
Basophils Absolute: 0 10*3/uL (ref 0.0–0.1)
Eosinophils Absolute: 0.1 10*3/uL (ref 0.0–0.7)
Eosinophils Relative: 3 %
HEMATOCRIT: 37.2 % (ref 36.0–46.0)
Hemoglobin: 11.9 g/dL — ABNORMAL LOW (ref 12.0–15.0)
Immature Granulocytes: 0 %
LYMPHS PCT: 34 %
Lymphs Abs: 0.8 10*3/uL (ref 0.7–4.0)
MCH: 26.7 pg (ref 26.0–34.0)
MCHC: 32 g/dL (ref 30.0–36.0)
MCV: 83.6 fL (ref 78.0–100.0)
MONO ABS: 0.2 10*3/uL (ref 0.1–1.0)
MONOS PCT: 9 %
NEUTROS PCT: 53 %
Neutro Abs: 1.3 10*3/uL — ABNORMAL LOW (ref 1.7–7.7)
Platelets: 212 10*3/uL (ref 150–400)
RBC: 4.45 MIL/uL (ref 3.87–5.11)
RDW: 13.1 % (ref 11.5–15.5)
WBC: 2.4 10*3/uL — ABNORMAL LOW (ref 4.0–10.5)

## 2018-02-19 LAB — SAVE SMEAR

## 2018-02-19 MED ORDER — PANTOPRAZOLE SODIUM 40 MG PO TBEC: 40.0000 mg | DELAYED_RELEASE_TABLET | Freq: Every day | ORAL | 0 refills | Status: DC

## 2018-02-19 NOTE — Assessment & Plan Note (Addendum)
Patient with chronic leukopenia since 2014 with a downward trend in her WBC. HIV negative as of 12/2017. CBC with diff completed today demonstrates WBC of 2400, neutrophil count decreased at 1300.   Plan: -Save smear, plan to review with Dr. Beryle Beams and discuss case  -Quantitative Ig antibodies pending   Addendum: Peripheral smear review with Dr. Beryle Beams. RBCs normal morphology and normochromic. Neutrophils normal in morphology, no inclusions. Several reactive lymphocytes, but this is a non-specific finding. Platelets normal in morphology and number.   Quantitative Ig levels: IgG decreased at 417, IgA decreased at 47   Per chart review, it does not appear that she has had multiple hospitalizations or clinic visits for recurrent infections. ANC 1300, Lymph count 800. Neutrophil count high enough to prevent recurrent infections. Clinically, the patient is healthy and only major medical conditions are asthma and IBS. Will continue to monitor.

## 2018-02-19 NOTE — Assessment & Plan Note (Signed)
Patient describes symptoms of indigestion, belching and reflux.  She is prescribed Protonix 40 mg daily.  She states she only takes his medicine on an as needed basis.  She is requesting refills today.   Symptoms are likely to undertreated GERD in setting of intermittent Protonix use.   Plan: -Discussed with the patient that Protonix is to be taken daily to achieve its affect and it is not an as needed medicine -Advised patient to take tums or OTC zantec for immediate relief of reflux symptoms  -Refilled Protonix 40 mg daily

## 2018-02-19 NOTE — Assessment & Plan Note (Signed)
The patient's episodes of dizziness seem to be consistent with both vasovagal presyncope and orthostatic hypotension. Her first episode with nausea and the sensation of needing to defecate is more consistent with vasovagal pre-syncope. Her other episodes are postural and seem more related to orthostatic hypotension. Given the duration of symptoms (5-15 minutes) this does not seem consistent with BPPV. Vertigo with BPPV typically is on seconds to a minute in duration. Orthostatic vital signs were checked and were not positive in clinic today, but BP low normal 101/67 HR 57. Symptoms do not sound consistent with cardiogenic syncope.  Review of patient's medication was unremarkable. Per patient she is not taking hyoscyamine or Bentyl.   Plan: -Reassured patient and explained both vasovagal pre-syncope and orthostatic hypotension -Discussed sitting on side of bed for several minutes prior to standing -will continue to monitor

## 2018-02-19 NOTE — Patient Instructions (Addendum)
Aimee Davis,   I do not think our dizziness needs to be worked up further. It seems that is related to position changes and you likely experience a drop in your blood pressure, which causes dizziness.   You do have a low white blood cell count that I think is worth working up. I have a few labs I would like to order and will call you with the results and if further work up is needed.   I have refilled your Protonix.

## 2018-02-19 NOTE — Progress Notes (Signed)
   CC: dizziness  HPI:  [name] is a 62 y.o. female with PMH as documented below presenting with chief complaint of dizziness. The patient has had several episodes of dizziness. She states the first episode occurred 5 days ago (Saturday) while she was hanging pictures and standing on a ladder. She stepped down off the ladder and developed the sensation of the room spinning. This was associated with nausea and the sensation of having to defecate. She did not vomit and was unable to have a bowel movement. She also states she was having a lot of belching and indigestion at that time. She was able to get to her bed and lay down. She states the entire episode lasted for approximately 10-15 minutes and she fell asleep, and woke up without symptoms. She had another episode of dizziness the next day after bending over to pick up her earring off the floor. She has also been experiencing lightheadedness and dizziness when standing from her bed. Her dizziness episodes are not associated or exacerbated with head rotation. No recent infections or changes in medications. No changes in PO intake. No ear pain.    Past Medical History:  Diagnosis Date  . Allergy    seasonal, PCN, antidepressants, bentyl  . Anemia   . Asthma   . Carpal tunnel syndrome on both sides    right worse than left 2008  . GERD (gastroesophageal reflux disease)   . Hyperlipidemia    LDL 108 JULY 2013  . Multiple nevi 03/01/2015  . Positive TB test    From MArch 2009 note : Recent F/u at [facility]. CXRAy negative.  Positive TST in 2007 (44mm).  Unable to tolerate INH due to hepatotoxicity   Review of Systems:   Review of Systems  Constitutional: Negative for chills and fever.  Eyes: Negative for blurred vision and double vision.  Respiratory: Negative for shortness of breath.   Cardiovascular: Negative for chest pain.  Gastrointestinal: Positive for constipation, heartburn and nausea. Negative for abdominal pain, diarrhea and  vomiting.  Neurological: Positive for dizziness. Negative for sensory change, speech change, focal weakness, loss of consciousness and headaches.    Physical Exam:  Vitals:   02/19/18 0953  BP: 107/67  Pulse: (!) 57  Temp: 98 F (36.7 C)  TempSrc: Oral  SpO2: 100%  Weight: 149 lb (67.6 kg)  Height: 5\' 3"  (1.6 m)   Physical Exam  Constitutional: She is oriented to person, place, and time. She appears well-developed and well-nourished. No distress.  HENT:  Head: Normocephalic and atraumatic.  Mouth/Throat: Oropharynx is clear and moist.  Eyes: Pupils are equal, round, and reactive to light. Conjunctivae and EOM are normal. No scleral icterus.  Neck: Normal range of motion. Neck supple.  Cardiovascular: Normal rate, regular rhythm and normal heart sounds.  Pulmonary/Chest: Effort normal and breath sounds normal.  Musculoskeletal: Normal range of motion. She exhibits no edema.  Neurological: She is alert and oriented to person, place, and time. She has normal strength. No cranial nerve deficit. Coordination normal.  Skin: Skin is warm and dry.  Psychiatric: She has a normal mood and affect.    Assessment & Plan:   See Encounters Tab for problem based charting.  Patient discussed with Dr. Evette Doffing

## 2018-02-20 LAB — IGG, IGA, IGM
IGM (IMMUNOGLOBULIN M), SRM: 61 mg/dL (ref 26–217)
IgA/Immunoglobulin A, Serum: 47 mg/dL — ABNORMAL LOW (ref 87–352)
IgG (Immunoglobin G), Serum: 417 mg/dL — ABNORMAL LOW (ref 700–1600)

## 2018-02-20 NOTE — Progress Notes (Signed)
Internal Medicine Clinic Attending  Case discussed with Dr. Aggie Hacker  at the time of the visit.  We reviewed the resident's history and exam and pertinent patient test results.  I agree with the assessment, diagnosis, and plan of care documented in the resident's note.  Chronic leukopenia for several years without clear explanation in the chart. 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 Dr. Aggie Hacker will eval her smear with Dr. Darnell Level and we may want to have her establish for a dedicated heme visit.

## 2018-02-21 DIAGNOSIS — D709 Neutropenia, unspecified: Secondary | ICD-10-CM | POA: Insufficient documentation

## 2018-02-28 ENCOUNTER — Other Ambulatory Visit: Payer: Self-pay

## 2018-02-28 ENCOUNTER — Telehealth: Payer: Self-pay | Admitting: Dietician

## 2018-02-28 ENCOUNTER — Ambulatory Visit: Payer: Self-pay | Admitting: Dietician

## 2018-02-28 NOTE — Telephone Encounter (Signed)
Rescheduled her appointment. Also called back after that and requested a return call for questions.

## 2018-03-04 ENCOUNTER — Other Ambulatory Visit: Payer: Self-pay

## 2018-03-04 ENCOUNTER — Encounter: Payer: Self-pay | Admitting: Dietician

## 2018-03-04 ENCOUNTER — Ambulatory Visit (INDEPENDENT_AMBULATORY_CARE_PROVIDER_SITE_OTHER): Payer: Self-pay | Admitting: Dietician

## 2018-03-04 ENCOUNTER — Ambulatory Visit: Payer: Self-pay | Admitting: Dietician

## 2018-03-04 ENCOUNTER — Telehealth: Payer: Self-pay | Admitting: Dietician

## 2018-03-04 DIAGNOSIS — R634 Abnormal weight loss: Secondary | ICD-10-CM

## 2018-03-04 DIAGNOSIS — K589 Irritable bowel syndrome without diarrhea: Secondary | ICD-10-CM

## 2018-03-04 DIAGNOSIS — Z713 Dietary counseling and surveillance: Secondary | ICD-10-CM

## 2018-03-04 DIAGNOSIS — Z6825 Body mass index (BMI) 25.0-25.9, adult: Secondary | ICD-10-CM

## 2018-03-04 NOTE — Progress Notes (Signed)
  Medical Nutrition Therapy:  Appt start time: 0139 end time:  5027. Visit # 4  Assessment:  Primary concerns today: unintentional weight loss and IBS symptoms.  Aimee Davis weight has stayed in a healthy range for her height and age. It has been stable for the past 6 months. She complains of being fatigued today. She is not exercising. She is having ~ 1 bowel movement /day but it is not formed.  She no longer has much  abdominal pain. She is using the psyllium husk after stopping it for a while and feels she did not use it regularly enough to help. Her reported intake is good except she does skip meals at times and did recently before having the episode of dizziness.   ANTHROPOMETRICS: Estimated body mass index is 25.01 kg/m as calculated from the following:   Height as of this encounter: 5' 4.5" (1.638 m).   Weight as of this encounter: 148 lb (67.1 kg).  Wt Readings from Last 3 Encounters:  03/04/18 148 lb (67.1 kg)  02/19/18 149 lb (67.6 kg)  12/31/17 148 lb 14.4 oz (67.5 kg)    Estimated daily energy needs: 1500-1800 calories 70-80 g protein  Progress Towards Goal(s):  Some progress.   Nutritional Diagnosis:  [place]-3.2 Unintentional weight loss As related to increased needs from illness and decreased intake from illness and self limiting to try to improve her IBS symptoms is improved and resolved  As evidenced by her documented consistent weights and report of feeling better at her current weight    Intervention:  Nutrition education about appropriateness of current weight,  and  diet for her IBS symptoms.   Coordination of care: notify doctor that patient has started a new herbal "medicine" ChlorOxygen Chlorophyll Concentrate  Teaching Method Utilized: Visual,,Auditory,Hands on Handouts given during visit include: AVS  Barriers to learning/adherence to lifestyle change: competing values Demonstrated degree of understanding via:  Teach Back   Monitoring/Evaluation:  Dietary  intake, exercise, and body weight prn. Debera Lat, RD 03/04/2018 3:38 PM.

## 2018-03-04 NOTE — Patient Instructions (Signed)
Today at 148# your BMI is 25.01- this is the highest weight for your height in the healthy weight category.   BMI from 25- 29.9 is considered overweight.   At 156# your BMI would be 26- which is considered overweight for your height.   PSYLLIUM husk- I agree with your goal to do this daily. Start with 1 tablespoon per day for a few weeks  Gradually increase to two tablespoons.  Stay gluten free if you think it is helping your bowels. Otherwise okay to use wheat products.  Whole grain bread is fine- can be whole grain wheat, rye, oat.   Whole grain pasta- mac and cheese with whole grain macaroni is s good example.  I think you are doing well. We can follow up as needed.  When hungry- its good to think about  1- what do I need  2- what do I want  3- what is good for me.

## 2018-03-04 NOTE — Telephone Encounter (Signed)
Tried to call. she was in the waiting room

## 2018-03-14 ENCOUNTER — Telehealth: Payer: Self-pay | Admitting: Internal Medicine

## 2018-03-14 DIAGNOSIS — D709 Neutropenia, unspecified: Secondary | ICD-10-CM

## 2018-03-14 NOTE — Telephone Encounter (Signed)
Patient is wanting to get her blood count check, last time she was here physician told her it was low.  Please call patient

## 2018-03-19 NOTE — Telephone Encounter (Signed)
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  Because the neutropenia is mild (greater than 1000 cells per microliter) her CBC should be repeated every 3 to 4 months for the first year and if stable further evaluation is only needed if she becomes symptomatic with fevers, mouth sores, or other signs of hematologic disorder.  Neutropenia was first found on a CBC in April of this year and was followed up in June.  She should return for another CBC in September, December and March.  These will be lab only visits I will follow-up the results and she does not need to see a provider when she comes in for these appointments.  I have placed a standing order for CBC every 3 months in this phone note.   She should follow-up on an as-needed basis and have a primary care appointment controlled in 1 year.  I have attempted to call Aimee Davis reached an unidentified Advertising account executive. If she calls back and needs to speak with me please have her leave a message with the times which are most convenient to reach her at home.

## 2018-03-21 NOTE — Addendum Note (Signed)
Addended by: Orson Gear on: 03/21/2018 11:16 AM   Modules accepted: Orders

## 2018-04-29 ENCOUNTER — Ambulatory Visit: Payer: Self-pay

## 2018-05-01 ENCOUNTER — Other Ambulatory Visit: Payer: Self-pay

## 2018-05-01 ENCOUNTER — Telehealth: Payer: Self-pay | Admitting: Internal Medicine

## 2018-05-01 ENCOUNTER — Encounter: Payer: Self-pay | Admitting: Internal Medicine

## 2018-05-01 ENCOUNTER — Ambulatory Visit (INDEPENDENT_AMBULATORY_CARE_PROVIDER_SITE_OTHER): Payer: Self-pay | Admitting: Internal Medicine

## 2018-05-01 VITALS — BP 101/65 | HR 80 | Temp 98.1°F | Ht 63.0 in | Wt 145.7 lb

## 2018-05-01 DIAGNOSIS — J45909 Unspecified asthma, uncomplicated: Secondary | ICD-10-CM

## 2018-05-01 DIAGNOSIS — D72819 Decreased white blood cell count, unspecified: Secondary | ICD-10-CM

## 2018-05-01 DIAGNOSIS — Z79899 Other long term (current) drug therapy: Secondary | ICD-10-CM

## 2018-05-01 DIAGNOSIS — R5383 Other fatigue: Secondary | ICD-10-CM

## 2018-05-01 DIAGNOSIS — L309 Dermatitis, unspecified: Secondary | ICD-10-CM

## 2018-05-01 DIAGNOSIS — E785 Hyperlipidemia, unspecified: Secondary | ICD-10-CM

## 2018-05-01 DIAGNOSIS — Z6825 Body mass index (BMI) 25.0-25.9, adult: Secondary | ICD-10-CM

## 2018-05-01 DIAGNOSIS — K589 Irritable bowel syndrome without diarrhea: Secondary | ICD-10-CM

## 2018-05-01 DIAGNOSIS — R634 Abnormal weight loss: Secondary | ICD-10-CM

## 2018-05-01 MED ORDER — HYDROCORTISONE 2.5 % EX LOTN: TOPICAL_LOTION | CUTANEOUS | 1 refills | Status: DC

## 2018-05-01 NOTE — Telephone Encounter (Signed)
Pharmacy called regarding hydrocortisone 2.5 % lotion pharmacy stocks 2.5% cream can prescription be change to cream into of lotion, pls contact pharmacy # [phone]

## 2018-05-01 NOTE — Patient Instructions (Addendum)
It was a pleasure to see you today Aimee Davis. Please make the following changes:  -Please follow with gastroenterology for EGD and colonoscopy and hydrogen breath test  -Please follow with hematology regarding chronic leukopenia -Please use hydrocortisone cream over your rash  If you have any questions or concerns, please call our clinic at [phone] between 9am-5pm and after hours call [phone] and ask for the internal medicine resident on call. If you feel you are having a medical emergency please call 911.   Thank you, we look forward to help you remain healthy!  Lars Mage, MD Internal Medicine PGY2

## 2018-05-01 NOTE — Progress Notes (Addendum)
CC: Fatigue and rash on back  HPI:  Aimee Davis is a 62 y.o. with asthma, irritable bowel syndrome, hyperlipidemia, anemia who presents for fatigue and rash on back. Please see problem based charting for evaluation, assessment, and plan.  Past Medical History:  Diagnosis Date  . Allergy    seasonal, PCN, antidepressants, bentyl  . Anemia   . Asthma   . Carpal tunnel syndrome on both sides    right worse than left 2008  . GERD (gastroesophageal reflux disease)   . Hyperlipidemia    LDL 108 JULY 2013  . Multiple nevi 03/01/2015  . Positive TB test    From M[facility] 2009 note : Recent F/u at [facility]. CXRAy negative.  Positive TST in 2007 (20m).  Unable to tolerate INH due to hepatotoxicity   Review of Systems:    Has fatigue, rash Denies shortness of breath, chest pain, dizziness, fevers  Physical Exam:  Vitals:   05/01/18 1040  BP: 101/65  Pulse: 80  Temp: 98.1 F (36.7 C)  TempSrc: Oral  SpO2: 99%  Weight: 145 lb 11.2 oz (66.1 kg)  Height: _0  (1.6 m)   Physical Exam  Constitutional: She appears well-developed and well-nourished. No distress.  HENT:  Head: Normocephalic and atraumatic.  Eyes: Conjunctivae are normal.  Cardiovascular: Normal rate, regular rhythm and normal heart sounds.  Respiratory: Effort normal and breath sounds normal. No respiratory distress. She has no wheezes.  GI: Soft. Bowel sounds are normal. She exhibits no distension. There is no tenderness.  Musculoskeletal: She exhibits no edema.  Neurological: She is alert.  Skin: Rash (3 to 4 cm area of thickened, raised, erythematous skin in the left upper back) noted. She is not diaphoretic.    Assessment & Plan:   See Encounters Tab for problem based charting.  Eczema The patient states that for the past few weeks she has noticed a few rashes on her body that are pruritic.  The rashes are erythematous, raised and thickened skin present over the left upper back.  She also has an area that  is 2 to 3 cm in size over left arm.  The patient stated that she has used emollients that she has at home which has not alleviated the pruritus or decreased the size of the rash.  Assessment and plan The patient's rash is consistent with eczema.  Prescribed hydrocortisone 2.5% cream.  Fatigue and weight loss The patient states that she has been feeling very tired over the past few months.  She is also had an unintentional weight loss of approximately 15 pounds over the past year.  Patient has been seen by other providers and it was thought that the weight loss was due to IBS.  Assessment and plan Patient states that she has a varied amount of sleep nightly ranging from 4 to 10 hours.  Previous testing for weight loss has been negative.  HIV was negative, TSH normal range,PTSD,with migraine, with female with PTSD, migraine history and no CMP without significant electrolyte abnormalities increasing her enzymes.  However, the patient does have a significant leukopenia which might be contributing to her fatigue.   Evaluated patient for depression and she is denied any symptoms of loss of interest in what she usually enjoys, depression, suicidal ideation, decrease in concentration.  The patient's PHQ 9 was 5.  -We will check vitamin B12 level -We will check for progression of leukopenia -Patient is to to follow for other testing to further work-up her IBS versus  abdominal wall pain versus SIBO with hydrogen breath test, EGD, colonoscopy.  The patient already has these procedures scheduled.  Leukopenia The patient has had a chronic severe leukopenia noted since 2015.  The patient's last CBC was done in June 2019 which showed a WBC = 2.4, neutrophils = 1.2.  Quantitative Ig G levels showed a low amount of IgG = 417, IgA = 47.  Smear review showed reactive lymphocytes.  Assessment and plan Ordered CBC to note whether there is further progression of patient's leukopenia. The patient has not had any  significant infections or fevers and therefore it may be a normal variant. However, will refer to hematology/oncology to determine if it may be a possible malignancy.  ADDENDUM The patient's CBC returned with WBC=1.9, normal hemoglobin, MCV = 83, platelets 242.  The patient's results are consistent with chronic leukopenia.  The patient may benefit from further evaluation with bone marrow biopsy.  Vitamin B12 level returned normal and therefore it is unlikely to be contributing to her leukopenia.  Other potential causes of leukopenia that should be ruled out include paroxysmal nocturnal hemoglobinuria which has a constellation of symptoms that the patient has presented with-abdominal pain, fatigue.  However, for PNH she would also expect thrombocytopenia, anemia, and hyperbilirubinemia. If other causes are ruled out the patient may have autoimmune neutropenia and benign ethnic neutropenia.   Patient discussed with Dr. Dareen Piano

## 2018-05-01 NOTE — Telephone Encounter (Signed)
Called [facility], spoke w/ dawn, changed lotion to cream, they only stock cream

## 2018-05-01 NOTE — Telephone Encounter (Signed)
No problem it can be changed to cream. Thanks!

## 2018-05-02 DIAGNOSIS — L309 Dermatitis, unspecified: Secondary | ICD-10-CM

## 2018-05-02 HISTORY — DX: Dermatitis, unspecified: L30.9

## 2018-05-02 LAB — VITAMIN B12: Vitamin B-12: 805 pg/mL (ref 232–1245)

## 2018-05-02 LAB — CBC
Hematocrit: 38.9 % (ref 34.0–46.6)
Hemoglobin: 12.6 g/dL (ref 11.1–15.9)
MCH: 27 pg (ref 26.6–33.0)
MCHC: 32.4 g/dL (ref 31.5–35.7)
MCV: 83 fL (ref 79–97)
PLATELETS: 242 10*3/uL (ref 150–450)
RBC: 4.67 x10E6/uL (ref 3.77–5.28)
RDW: 14 % (ref 12.3–15.4)
WBC: 1.9 10*3/uL — CL (ref 3.4–10.8)

## 2018-05-02 NOTE — Progress Notes (Signed)
Internal Medicine Clinic Attending  Case discussed with Dr. Chundi at the time of the visit.  We reviewed the resident's history and exam and pertinent patient test results.  I agree with the assessment, diagnosis, and plan of care documented in the resident's note. 

## 2018-05-02 NOTE — Assessment & Plan Note (Signed)
The patient states that for the past few weeks she has noticed a few rashes on her body that are pruritic.  The rashes are erythematous, raised and thickened skin present over the left upper back.  She also has an area that is 2 to 3 cm in size over left arm.  The patient stated that she has used emollients that she has at home which has not alleviated the pruritus or decreased the size of the rash.  Assessment and plan The patient's rash is consistent with eczema.  Prescribed hydrocortisone 2.5% cream.

## 2018-05-02 NOTE — Assessment & Plan Note (Addendum)
The patient has had a chronic severe leukopenia noted since 2015.  The patient's last CBC was done in June 2019 which showed a WBC = 2.4, neutrophils = 1.2.  Quantitative Ig G levels showed a low amount of IgG = 417, IgA = 47.  Smear review showed reactive lymphocytes.  Assessment and plan Ordered CBC to note whether there is further progression of patient's leukopenia. The patient has not had any significant infections or fevers and therefore it may be a normal variant. However, will refer to hematology/oncology to determine if it may be a possible malignancy.  ADDENDUM The patient's CBC returned with WBC=1.9, normal hemoglobin, MCV = 83, platelets 242.  The patient's results are consistent with chronic leukopenia.  The patient may benefit from further evaluation with bone marrow biopsy.  Vitamin B12 level returned normal and therefore it is unlikely to be contributing to her leukopenia.  Other potential causes of leukopenia that should be ruled out include paroxysmal nocturnal hemoglobinuria which has a constellation of symptoms that the patient has presented with-abdominal pain, fatigue.  However, for PNH she would also expect thrombocytopenia, anemia, and hyperbilirubinemia. If other causes are ruled out the patient may have autoimmune neutropenia and benign ethnic neutropenia.

## 2018-05-02 NOTE — Assessment & Plan Note (Signed)
The patient states that she has been feeling very tired over the past few months.  She is also had an unintentional weight loss of approximately 15 pounds over the past year.  Patient has been seen by other providers and it was thought that the weight loss was due to IBS.  Assessment and plan Patient states that she has a varied amount of sleep nightly ranging from 4 to 10 hours.  Previous testing for weight loss has been negative.  HIV was negative, TSH normal range,PTSD,with migraine, with female with PTSD, migraine history and no CMP without significant electrolyte abnormalities increasing her enzymes.  However, the patient does have a significant leukopenia which might be contributing to her fatigue.   Evaluated patient for depression and she is denied any symptoms of loss of interest in what she usually enjoys, depression, suicidal ideation, decrease in concentration.  The patient's PHQ 9 was 5.  -We will check vitamin B12 level -We will check for progression of leukopenia -Patient is to to follow for other testing to further work-up her IBS versus abdominal wall pain versus SIBO with hydrogen breath test, EGD, colonoscopy.  The patient already has these procedures scheduled.

## 2018-05-07 ENCOUNTER — Telehealth: Payer: Self-pay | Admitting: Hematology

## 2018-05-07 ENCOUNTER — Encounter: Payer: Self-pay | Admitting: Hematology

## 2018-05-07 ENCOUNTER — Telehealth: Payer: Self-pay | Admitting: Internal Medicine

## 2018-05-07 NOTE — Telephone Encounter (Signed)
New hematology referral received from Dr. Maricela Bo for leukopenia. Pt was unaware of the referral. I scheduled her to see Dr. Irene Limbo on 9/10 at 1pm. A message was sent to the referral coordinator to call the pt about the reason for the referral and with the appt date and time. Letter mailed to the pt.

## 2018-05-07 NOTE — Telephone Encounter (Signed)
Called patient.  She was anxious because she was called from an oncology office however clarified with her that the referral was made mainly for hematology referral which we had discussed during her clinic visit.  The patient's questions were answered and she was relieved to hear that.

## 2018-05-07 NOTE — Telephone Encounter (Signed)
Pls Dr Maricela Bo call pt, pt is unaware of oncology calling her to make an appt.  Pls call pt [phone]

## 2018-05-08 ENCOUNTER — Telehealth: Payer: Self-pay | Admitting: Hematology

## 2018-05-08 NOTE — Telephone Encounter (Signed)
Pt returned my call and confirmed appt with Dr. Irene Limbo. Pt is aware to arrive 30 minutes early.

## 2018-05-08 NOTE — Telephone Encounter (Signed)
Lft the pt a vm with appt information to see Dr. Irene Limbo on 9/10 at 1pm.

## 2018-05-15 ENCOUNTER — Telehealth: Payer: Self-pay | Admitting: Internal Medicine

## 2018-05-15 NOTE — Telephone Encounter (Signed)
Pt is having trouble sleeping, is there something she can take; pt contact [phone]

## 2018-05-16 NOTE — Telephone Encounter (Signed)
Pt stated she took Melatonin 3 mg last night and finally worked.

## 2018-05-16 NOTE — Telephone Encounter (Signed)
Hello, thank you for letting me know. I would have Aimee Davis start with melatonin and researching sleep hygiene, if these dont work for her she can schedule a continuity clinic visit and we should discuss further.

## 2018-05-27 ENCOUNTER — Inpatient Hospital Stay: Payer: Self-pay

## 2018-05-27 ENCOUNTER — Telehealth: Payer: Self-pay | Admitting: Hematology

## 2018-05-27 ENCOUNTER — Inpatient Hospital Stay: Payer: Self-pay | Attending: Hematology | Admitting: Hematology

## 2018-05-27 ENCOUNTER — Encounter: Payer: Self-pay | Admitting: Hematology

## 2018-05-27 VITALS — BP 126/67 | HR 82 | Temp 97.6°F | Resp 16 | Wt 152.7 lb

## 2018-05-27 DIAGNOSIS — D709 Neutropenia, unspecified: Secondary | ICD-10-CM

## 2018-05-27 DIAGNOSIS — D72819 Decreased white blood cell count, unspecified: Secondary | ICD-10-CM | POA: Insufficient documentation

## 2018-05-27 DIAGNOSIS — Z87891 Personal history of nicotine dependence: Secondary | ICD-10-CM | POA: Insufficient documentation

## 2018-05-27 LAB — CBC WITH DIFFERENTIAL/PLATELET
BASOS PCT: 1 %
Basophils Absolute: 0 10*3/uL (ref 0.0–0.1)
EOS PCT: 1 %
Eosinophils Absolute: 0 10*3/uL (ref 0.0–0.5)
HEMATOCRIT: 36.9 % (ref 34.8–46.6)
Hemoglobin: 11.9 g/dL (ref 11.6–15.9)
Lymphocytes Relative: 19 %
Lymphs Abs: 0.7 10*3/uL — ABNORMAL LOW (ref 0.9–3.3)
MCH: 27.2 pg (ref 25.1–34.0)
MCHC: 32.2 g/dL (ref 31.5–36.0)
MCV: 84.4 fL (ref 79.5–101.0)
MONO ABS: 0.2 10*3/uL (ref 0.1–0.9)
MONOS PCT: 7 %
Neutro Abs: 2.6 10*3/uL (ref 1.5–6.5)
Neutrophils Relative %: 72 %
PLATELETS: 187 10*3/uL (ref 145–400)
RBC: 4.37 MIL/uL (ref 3.70–5.45)
RDW: 13.5 % (ref 11.2–14.5)
WBC: 3.5 10*3/uL — ABNORMAL LOW (ref 3.9–10.3)

## 2018-05-27 LAB — CMP (CANCER CENTER ONLY)
ALK PHOS: 72 U/L (ref 38–126)
ALT: 14 U/L (ref 0–44)
ANION GAP: 9 (ref 5–15)
AST: 19 U/L (ref 15–41)
Albumin: 3.9 g/dL (ref 3.5–5.0)
BUN: 17 mg/dL (ref 8–23)
CALCIUM: 9.8 mg/dL (ref 8.9–10.3)
CO2: 25 mmol/L (ref 22–32)
CREATININE: 1.01 mg/dL — AB (ref 0.44–1.00)
Chloride: 109 mmol/L (ref 98–111)
GFR, EST NON AFRICAN AMERICAN: 58 mL/min — AB (ref >60)
Glucose, Bld: 80 mg/dL (ref 70–99)
Potassium: 4.6 mmol/L (ref 3.5–5.1)
SODIUM: 143 mmol/L (ref 135–145)
Total Bilirubin: 0.4 mg/dL (ref 0.3–1.2)
Total Protein: 6.4 g/dL — ABNORMAL LOW (ref 6.5–8.1)

## 2018-05-27 LAB — RETICULOCYTES
RBC.: 4.37 MIL/uL (ref 3.70–5.45)
RETIC COUNT ABSOLUTE: 43.7 10*3/uL (ref 33.7–90.7)
Retic Ct Pct: 1 % (ref 0.7–2.1)

## 2018-05-27 LAB — LACTATE DEHYDROGENASE: LDH: 162 U/L (ref 98–192)

## 2018-05-27 LAB — VITAMIN B12: Vitamin B-12: 493 pg/mL (ref 180–914)

## 2018-05-27 LAB — SEDIMENTATION RATE: SED RATE: 8 mm/h (ref 0–22)

## 2018-05-27 NOTE — Progress Notes (Signed)
HEMATOLOGY/ONCOLOGY CONSULTATION NOTE  Date of Service: 05/27/2018  Patient Care Team: Ledell Noss, MD as PCP - General  CHIEF COMPLAINTS/PURPOSE OF CONSULTATION:  Leukopenia   HISTORY OF PRESENTING ILLNESS:  Aimee Davis is a wonderful 62 y.o. female who has been referred to [place] by Dr. Ledell Noss  for evaluation and management of Leukopenia. The pt reports that she is doing well overall.   The pt reports that she has been aware of her low WBC for the last 5 years. She notes that she stopped smoking 5-6 years ago.  She does chew 2mg  nicotine gum 4-5 times a day. She does not use any NSAIDs. She notes that she has had marginally less energy in the last few years which she has noticed in the context of going on walks or going to the gym. She denies this fatigue affecting her daily activities. She notes that she had been losing weight but has recently been gaining weight.   She also notes that she has IBS-C and has not had a recent colonoscopy or endoscopy, but has taken Protonix for the last several months. She also notes that she has had a new skin rash for a couple months and has been using a steroid cream and was told this is eczema. The pt denies any concern for frequent infections as a child. The pt notes that she did have pneumonia in December 2018, and has received both pneumonia vaccines. She notes that she has not been on steroids historically.   The pt notes that she required a blood transfusion 40 years ago after delivering her son. She also notes that she came into contact with an individual with TB ten years ago. She has never traveled outside of the country. She has recently begun taking a multi-vitamin, B complex and fish oil.   Most recent lab results (05/01/18) of CBC is as follows: all values are WNL except for WBC at 1.9k. Last CBC w/diff was collected on 02/19/18 and revealed all values WNL except for WBC at 2.4k, HGB at 11.9, ANC at 1.3k.  02/19/18 Immunoglobulin showed IgG at  417 and IgA at 47  On review of systems, pt reports intermittent abdominal discomfort, recent weight gain, scaly skin rash on shoulder, and denies fevers, chills, night sweats, mouth sores, red/swollen joints, frequent infections, noticing any new lumps or bumps, pain along the spine, abdominal pain, lower abdominal pains, changes in urination, and any other symptoms.   On PMHx the pt reports asthma, IBS-C.  On Social Hx the pt reports that she quit smoking 5-6 years ago. She quit drinking ETOH 25 years ago. She notes that she smoked cocaine one time in 1994.   On Family Hx the pt reports two sisters with breast cancer. Both sisters and brother with low blood counts, out of 8 siblings total. Several aunts and uncles requiring kidney transplants.   MEDICAL HISTORY:  Past Medical History:  Diagnosis Date  . Allergy    seasonal, PCN, antidepressants, bentyl  . Anemia   . Asthma   . Carpal tunnel syndrome on both sides    right worse than left 2008  . GERD (gastroesophageal reflux disease)   . Hyperlipidemia    LDL 108 JULY 2013  . Multiple nevi 03/01/2015  . Positive TB test    From [facility] 2009 note : Recent F/u at [facility]. CXRAy negative.  Positive TST in 2007 (9mm).  Unable to tolerate INH due to hepatotoxicity    SURGICAL HISTORY:  Past Surgical History:  Procedure Laterality Date  . BREAST CYST EXCISION Right 2011  . BREAST EXCISIONAL BIOPSY Left    benign  . BREAST LUMPECTOMY Right 1990's  . INDUCED ABORTION  2005  . VAGINAL HYSTERECTOMY  1983   partial; for abnormal bleeding    SOCIAL HISTORY: Social History   Socioeconomic History  . Marital status: Divorced    Spouse name: Not on file  . Number of children: Not on file  . Years of education: Not on file  . Highest education level: Not on file  Occupational History  . Not on file  Social Needs  . Financial resource strain: Not on file  . Food insecurity:    Worry: Not on file    Inability: Not on file  .  Transportation needs:    Medical: Not on file    Non-medical: Not on file  Tobacco Use  . Smoking status: Former Smoker    Packs/day: 0.33    Years: 43.00    Pack years: 14.19    Types: Cigarettes    Last attempt to quit: 01/06/2013    Years since quitting: 5.3  . Smokeless tobacco: Never Used  Substance and Sexual Activity  . Alcohol use: No    Alcohol/week: 0.0 standard drinks  . Drug use: No    Types: Cocaine, Marijuana  . Sexual activity: Not on file    Comment: S/P PARTIAL HYSTERECTOMY  Lifestyle  . Physical activity:    Days per week: Not on file    Minutes per session: Not on file  . Stress: Not on file  Relationships  . Social connections:    Talks on phone: Not on file    Gets together: Not on file    Attends religious service: Not on file    Active member of club or organization: Not on file    Attends meetings of clubs or organizations: Not on file    Relationship status: Not on file  . Intimate partner violence:    Fear of current or ex partner: Not on file    Emotionally abused: Not on file    Physically abused: Not on file    Forced sexual activity: Not on file  Other Topics Concern  . Not on file  Social History Narrative  . Not on file    FAMILY HISTORY: Family History  Problem Relation Age of Onset  . Hypertension Mother   . Hyperlipidemia Mother   . Heart disease Mother   . Diabetes Mother   . Kidney disease Mother   . Stroke Father   . Diabetes Father   . Heart disease Father   . Hyperlipidemia Father   . Hypertension Father   . Kidney disease Father   . Cancer Sister        breast  . Mental illness Sister   . Diabetes Sister   . Kidney disease Sister   . Hypertension Sister   . Mental illness Brother   . Asthma Son   . Hypertension Maternal Aunt   . Hypertension Maternal Uncle   . Hypertension Paternal Aunt   . Kidney disease Paternal Aunt   . Hypertension Paternal Uncle   . Kidney disease Paternal Uncle     ALLERGIES:  is  allergic to penicillins.  MEDICATIONS:  Current Outpatient Medications  Medication Sig Dispense Refill  . albuterol (PROVENTIL HFA;VENTOLIN HFA) 108 (90 Base) MCG/ACT inhaler Inhale 2 puffs into the lungs every 6 (six) hours as needed for wheezing.  1 Inhaler 6  . Artificial Tear Ointment (DRY EYES OP) Apply 1 drop to eye daily as needed (dry eyes).    . Ascorbic Acid (VITAMIN C) 100 MG tablet Take 50 mg by mouth daily.    . cetirizine (ZYRTEC) 10 MG tablet Take 10 mg by mouth daily.    . Fluticasone-Salmeterol (ADVAIR DISKUS) 250-50 MCG/DOSE AEPB Inhale 1 puff into the lungs 2 (two) times daily. 60 each 6  . mometasone (NASONEX) 50 MCG/ACT nasal spray Place 2 sprays in each nostril once daily as needed 17 g 6  . Multiple Vitamin (MULTIVITAMIN) capsule Take 1 capsule by mouth daily.    . pantoprazole (PROTONIX) 40 MG tablet Take 1 tablet (40 mg total) by mouth daily. 90 tablet 0  . Probiotic Product (PROBIOTIC DAILY PO) Take by mouth.    Marland Kitchen guaiFENesin-codeine (CHERATUSSIN AC) 100-10 MG/5ML syrup Take 5 mL by mouth every 4-6 hours when necessary cough. (Patient not taking: Reported on 05/27/2018) 120 mL 0  . hydrocortisone 2.5 % lotion Apply to affected area 2 times daily (Patient not taking: Reported on 05/27/2018) 60 mL 1  . loratadine (CLARITIN) 10 MG tablet Take 10 mg by mouth daily.     No current facility-administered medications for this visit.     REVIEW OF SYSTEMS:    10 Point review of Systems was done is negative except as noted above.  PHYSICAL EXAMINATION:  . Vitals:   05/27/18 1240  BP: 126/67  Pulse: 82  Resp: 16  Temp: 97.6 F (36.4 C)  SpO2: 100%   Filed Weights   05/27/18 1240  Weight: 152 lb 11.2 oz (69.3 kg)   .Body mass index is 27.05 kg/m.  GENERAL:alert, in no acute distress and comfortable SKIN: no acute rashes, no significant lesions EYES: conjunctiva are pink and non-injected, sclera anicteric OROPHARYNX: MMM, no exudates, no oropharyngeal erythema  or ulceration NECK: supple, no JVD LYMPH:  no palpable lymphadenopathy in the cervical, axillary or inguinal regions LUNGS: clear to auscultation b/l with normal respiratory effort HEART: regular rate & rhythm ABDOMEN:  normoactive bowel sounds , non tender, not distended. Extremity: no pedal edema PSYCH: alert & oriented x 3 with fluent speech NEURO: no focal motor/sensory deficits  LABORATORY DATA:  I have reviewed the data as listed  . CBC Latest Ref Rng & Units 05/27/2018 05/27/2018 05/01/2018  WBC 3.9 - 10.3 K/uL 3.5(L) - 1.9(LL)  Hemoglobin 11.6 - 15.9 g/dL 11.9 - 12.6  Hematocrit 34.0 - 46.6 % 36.8 36.9 38.9  Platelets 145 - 400 K/uL 187 - 242   ANC 1300--->2600 . CBC    Component Value Date/Time   WBC 3.5 (L) 05/27/2018 1358   RBC 4.37 05/27/2018 1358   RBC 4.37 05/27/2018 1358   HGB 11.9 05/27/2018 1358   HGB 12.6 05/01/2018 1123   HCT 36.8 05/27/2018 1359   PLT 187 05/27/2018 1358   PLT 242 05/01/2018 1123   MCV 84.4 05/27/2018 1358   MCV 83 05/01/2018 1123   MCH 27.2 05/27/2018 1358   MCHC 32.2 05/27/2018 1358   RDW 13.5 05/27/2018 1358   RDW 14.0 05/01/2018 1123   LYMPHSABS 0.7 (L) 05/27/2018 1358   LYMPHSABS 0.7 12/31/2017 1425   MONOABS 0.2 05/27/2018 1358   EOSABS 0.0 05/27/2018 1358   EOSABS 0.0 12/31/2017 1425   BASOSABS 0.0 05/27/2018 1358   BASOSABS 0.0 12/31/2017 1425     . CMP Latest Ref Rng & Units 05/27/2018 12/31/2017 09/15/2017  Glucose 70 - 99 mg/dL  80 83 103(H)  BUN 8 - 23 mg/dL 17 13 7   Creatinine 0.44 - 1.00 mg/dL 1.01(H) 0.92 0.80  Sodium 135 - 145 mmol/L 143 143 140  Potassium 3.5 - 5.1 mmol/L 4.6 4.5 3.9  Chloride 98 - 111 mmol/L 109 105 105  CO2 22 - 32 mmol/L 25 25 -  Calcium 8.9 - 10.3 mg/dL 9.8 9.7 -  Total Protein 6.5 - 8.1 g/dL 6.4(L) 5.9(L) -  Total Bilirubin 0.3 - 1.2 mg/dL 0.4 0.3 -  Alkaline Phos 38 - 126 U/L 72 63 -  AST 15 - 41 U/L 19 20 -  ALT 0 - 44 U/L 14 18 -   Component     Latest Ref Rng & Units 05/27/2018    Folate, Hemolysate     Not Estab. ng/mL 458.4  HCT     34.0 - 46.6 % 36.8  Folate, RBC     >498 ng/mL 1,246  Sed Rate     0 - 22 mm/hr 8  LDH     98 - 192 U/L 162  HCV Ab     0.0 - 0.9 s/co ratio <0.1  Vitamin B12     180 - 914 pg/mL 493    RADIOGRAPHIC STUDIES: I have personally reviewed the radiological images as listed and agreed with the findings in the report. No results found.  ASSESSMENT & PLAN:  62 y.o. female with  1. Leukopenia WBC count normalizing on their own and up from 1.9k to 3.5k  Neutropenia has resolved ANC up from 1300 to 2600. PLAN -Discussed patient's most recent CBC from 05/01/18, WBC at 1.9k. Previous differential from 03/01/18 showed WBC at 2.4k and ANC at 1.3k -Not much concern for medication effect -No concerns for frequent infections historically, has siblings with low WBC counts, and pt quit smoking 5-6 years ago likely unmasking benign ethnic leukopenia -Advised staying up to date with all vaccinations including annual flu shot, shingles vaccine, and both pneumonia vaccinations every 5 years -Advised that the pt avoid individuals with obvious infections -If concerning or frequent infections develop, would treat with medications to boost WBC if needed and if neutropenic at the time. -labs w/u ordered  As noted below . Orders Placed This Encounter  Procedures  . CBC with Differential/Platelet    Standing Status:   Future    Number of Occurrences:   1    Standing Expiration Date:   07/01/2019  . CMP ([place] only)    Standing Status:   Future    Number of Occurrences:   1    Standing Expiration Date:   05/28/2019  . Vitamin B12    Standing Status:   Future    Number of Occurrences:   1    Standing Expiration Date:   05/27/2019  . Folate RBC    Standing Status:   Future    Number of Occurrences:   1    Standing Expiration Date:   05/28/2019  . Hepatitis C antibody    Standing Status:   Future    Number of Occurrences:   1    Standing  Expiration Date:   05/28/2019  . Lactate dehydrogenase    Standing Status:   Future    Number of Occurrences:   1    Standing Expiration Date:   05/28/2019  . Sedimentation rate    Standing Status:   Future    Number of Occurrences:   1    Standing Expiration Date:  05/27/2019  . Reticulocytes    Standing Status:   Future    Number of Occurrences:   1    Standing Expiration Date:   05/28/2019  . CBC with Differential/Platelet    Standing Status:   Future    Standing Expiration Date:   07/01/2019  . CMP ([place] only)    Standing Status:   Future    Standing Expiration Date:   05/28/2019   -labs reviewed WBC count and neutropenia resolving. B12, LDH , sed rate and RBC folate - WNL - no additional workup indicated at this time. -f/u in 6 months.   Labs today RTC with Dr Irene Limbo in 6 months with labs   All of the patients questions were answered with apparent satisfaction. The patient knows to call the clinic with any problems, questions or concerns.  The total time spent in the appt was 35 minutes and more than 50% was on counseling and direct patient cares.    Sullivan Lone MD MS AAHIVMS [facility] Hematology/Oncology Physician [facility]  (Office):       [phone] (Work cell):  [phone] (Fax):           [phone]  05/27/2018 1:48 PM  I, Baldwin [place], am acting as a scribe for Dr. Irene Limbo  I have reviewed the above documentation for accuracy and completeness, and I agree with the above. Brunetta Genera MD

## 2018-05-27 NOTE — Telephone Encounter (Signed)
Gave pt avs and calendar  °

## 2018-05-28 LAB — FOLATE RBC
Folate, Hemolysate: 458.4 ng/mL
Folate, RBC: 1246 ng/mL (ref >498)
Hematocrit: 36.8 % (ref 34.0–46.6)

## 2018-05-28 LAB — HEPATITIS C ANTIBODY: HCV Ab: <0.1 s/co ratio (ref 0.0–0.9)

## 2018-06-02 ENCOUNTER — Telehealth: Payer: Self-pay

## 2018-06-02 NOTE — Addendum Note (Signed)
Addended by: Sullivan Lone on: 06/02/2018 12:58 PM   Modules accepted: Level of Service

## 2018-06-02 NOTE — Telephone Encounter (Signed)
Called patient and let her know that per Dr. Irene Limbo her WBC count has nearly normalized and workup was otherwise unrevealing. Patient verbalized understanding and is aware of 6 month follow up appointments.

## 2018-06-02 NOTE — Telephone Encounter (Signed)
-----   Message from Brunetta Genera, MD sent at 06/02/2018 12:57 PM EDT ----- Lanelle Bal.Marland Kitchen Please let patient know her WBC count have nearly normalized and workup was otherwise unrevealing. Will monitor counts in 6 months and if stable/normalized in 6 months will discharge patient. thx GK

## 2018-06-10 ENCOUNTER — Encounter: Payer: Self-pay | Admitting: Internal Medicine

## 2018-06-24 ENCOUNTER — Other Ambulatory Visit: Payer: Self-pay

## 2018-06-24 ENCOUNTER — Encounter: Payer: Self-pay | Admitting: Internal Medicine

## 2018-06-24 ENCOUNTER — Ambulatory Visit (INDEPENDENT_AMBULATORY_CARE_PROVIDER_SITE_OTHER): Payer: Self-pay | Admitting: Internal Medicine

## 2018-06-24 DIAGNOSIS — Z79899 Other long term (current) drug therapy: Secondary | ICD-10-CM

## 2018-06-24 DIAGNOSIS — Z86010 Personal history of colonic polyps: Secondary | ICD-10-CM

## 2018-06-24 DIAGNOSIS — D709 Neutropenia, unspecified: Secondary | ICD-10-CM

## 2018-06-24 DIAGNOSIS — K581 Irritable bowel syndrome with constipation: Secondary | ICD-10-CM

## 2018-06-24 DIAGNOSIS — J302 Other seasonal allergic rhinitis: Secondary | ICD-10-CM

## 2018-06-24 DIAGNOSIS — Z23 Encounter for immunization: Secondary | ICD-10-CM

## 2018-06-24 DIAGNOSIS — J4540 Moderate persistent asthma, uncomplicated: Secondary | ICD-10-CM

## 2018-06-24 DIAGNOSIS — K219 Gastro-esophageal reflux disease without esophagitis: Secondary | ICD-10-CM

## 2018-06-24 MED ORDER — POLYETHYLENE GLYCOL 3350 17 G PO PACK: 17.0000 g | PACK | Freq: Every day | ORAL | 0 refills | Status: DC

## 2018-06-24 MED ORDER — PANTOPRAZOLE SODIUM 40 MG PO TBEC: 40.0000 mg | DELAYED_RELEASE_TABLET | Freq: Every day | ORAL | 11 refills | Status: DC

## 2018-06-24 MED ORDER — MOMETASONE FUROATE 50 MCG/ACT NA SUSP: NASAL | 11 refills | Status: DC

## 2018-06-24 NOTE — Assessment & Plan Note (Signed)
Aimee Davis request a referral for colonoscopy today. Her last colonoscopy was at [place] in 2014, it was normal but fair prep so follow up colonoscopy was recommended in 5 years. In 2011 she has a colonoscopy at [place] which was normal. Around 2003 she had a colonoscopy at a hospital outside of our system and says that she was found to have polyps at that time, unfortunately records are not available from that initial colonoscopy. She endorses constipation which she attributes to her IBS- C but denies change in stool caliber or blood in her bowel movements or melena. Her weight and appetite have been stable. She has an [place] card now and was told that this is not accepted at [facility] so she is requesting a referral for colonoscopy within [facility] today.  - referral placed for colonoscopy

## 2018-06-24 NOTE — Assessment & Plan Note (Signed)
Symptoms are well controlled with nasonex, she request a refill of this today.  - refilled nasonex

## 2018-06-24 NOTE — Progress Notes (Signed)
   CC: Request for colonoscopy referral   HPI:  [name] is a 62 y.o. with PMH of asthma, GERD, neutropenia who presents for follow up of colon cancer screening. Please see the assessment and plans for the status of the patient chronic medical problems.   Review of Systems:  Refer to history of present illness and assessment and plans for pertinent review of systems, all others reviewed and negative  Physical Exam:  Vitals:   06/24/18 1408  BP: 119/74  Pulse: 66  Temp: 97.6 F (36.4 C)  TempSrc: Oral  SpO2: 99%  Weight: 154 lb 14.4 oz (70.3 kg)  Height: 5\' 3"  (1.6 m)   General: well appearing, no acute distress  Cardiac: regular rate and rhythm, no murmur, no peripheral edema  Pulm: normal work of breathing, lungs clear to auscultation  GI: abdomen is soft, non tender, non distended   Assessment & Plan:   Need for screening colonoscopy  [name] request a referral for colonoscopy today. Her last colonoscopy was at [place] in 2014, it was normal but fair prep so follow up colonoscopy was recommended in 5 years. In 2011 she has a colonoscopy at [place] which was normal. Around 2003 she had a colonoscopy at a hospital outside of our system and says that she was found to have polyps at that time, unfortunately records are not available from that initial colonoscopy. She endorses constipation which she attributes to her IBS- C but denies change in stool caliber or blood in her bowel movements or melena. Her weight and appetite have been stable. She has an orange card now and was told that this is not accepted at [facility] so she is requesting a referral for colonoscopy within our [facility] today.  - referral placed for colonoscopy   IBS-C  Still predominantly affected by constipation. She has about 2-3 bowel movements per week which we discussed is a normal frequency but notes that she has increased straining with these bowel movements. She denies diarrhea, bloody  bowel movements, or abdominal discomfort.  - will prescribe a trial of miralax   GERD  Symptoms only about two days per week which are relieved when she takes protonix only as needed on those days.  - refilled protonix   Seasonal allergies  Symptoms are well controlled with nasonex, she request a refill of this today.  - refilled nasonex  Moderate persistant Asthma, controlled   Symptoms less than once per month, hasn't had to use albuterol inhaler in over a month.  - continue advair BID for maintenance and albuterol for rescue  continue to monitor   Preventative health  - referral for colonoscopy placed today  - flu shot given today   See Encounters Tab for problem based charting.  Patient discussed with Dr. Evette Doffing

## 2018-06-24 NOTE — Patient Instructions (Addendum)
Thank you for coming to the clinic today. It was a pleasure to see you.   I have referred you for a colonoscopy   Please call the internal medicine center clinic if you have any questions or concerns, we may be able to help and keep you from a long and expensive emergency room wait. Our clinic and after hours phone number is [phone], the best time to call is Monday through Friday 9 am to 4 pm but there is always someone available 24/7 if you have an emergency. If you need medication refills please notify your pharmacy one week in advance and they will send [place] a request.

## 2018-06-24 NOTE — Assessment & Plan Note (Signed)
Symptoms less than once per month, hasnt had to use albuterol inhaler in over a month.  - continue advair BID for maintenance and albuterol for rescue  continue to monitor

## 2018-06-24 NOTE — Assessment & Plan Note (Signed)
Symptoms only about two days per week which are relieved when she takes protonix only as needed on those days.  - refilled protonix

## 2018-06-24 NOTE — Assessment & Plan Note (Signed)
Still predominantly affected by constipation. She has about 2-3 bowel movements per week which we discussed is a normal frequency but notes that she has increased straining with these bowel movements. She denies diarrhea, bloody bowel movements, or abdominal discomfort.  - will prescribe a trial of miralax

## 2018-06-25 NOTE — Progress Notes (Signed)
Internal Medicine Clinic Attending  Case discussed with Dr. Blum at the time of the visit.  We reviewed the resident's history and exam and pertinent patient test results.  I agree with the assessment, diagnosis, and plan of care documented in the resident's note. 

## 2018-06-26 ENCOUNTER — Encounter: Payer: Self-pay | Admitting: Internal Medicine

## 2018-06-26 ENCOUNTER — Telehealth: Payer: Self-pay | Admitting: *Deleted

## 2018-06-26 ENCOUNTER — Ambulatory Visit: Payer: Self-pay

## 2018-06-26 NOTE — Telephone Encounter (Signed)
Nasonex 35mcg nasal spray Place 2 sprays in each nostril daily as needed 17g x 11 rf ( rx was "Print") called to [place].

## 2018-07-09 ENCOUNTER — Ambulatory Visit: Payer: Self-pay

## 2018-07-18 ENCOUNTER — Other Ambulatory Visit: Payer: Self-pay | Admitting: Internal Medicine

## 2018-07-22 ENCOUNTER — Other Ambulatory Visit: Payer: Self-pay | Admitting: Internal Medicine

## 2018-07-25 ENCOUNTER — Telehealth: Payer: Self-pay | Admitting: Internal Medicine

## 2018-07-25 NOTE — Addendum Note (Signed)
Addended by: Meryl Dare on: 07/25/2018 03:27 PM   Modules accepted: Orders

## 2018-07-25 NOTE — Telephone Encounter (Signed)
Pt hasn't been able to go to the bathroom bowel movement, she has tried a lot of this, can someone pls call her [phone] she is very uncomfortable.

## 2018-07-25 NOTE — Telephone Encounter (Signed)
F/u with the patient for her GI Referral.  Pt states she was seen on 06/24/2018 and has not heard back about her GI referral.  Please advise if a GI referral should have been placed as the patient can no longer go to [facility].

## 2018-07-25 NOTE — Telephone Encounter (Signed)
Thank you for letting me know, I have placed this referral in her last office visit.

## 2018-08-05 ENCOUNTER — Encounter: Payer: Self-pay | Admitting: Internal Medicine

## 2018-08-06 ENCOUNTER — Ambulatory Visit: Payer: Self-pay

## 2018-08-06 ENCOUNTER — Ambulatory Visit (INDEPENDENT_AMBULATORY_CARE_PROVIDER_SITE_OTHER): Payer: Self-pay | Admitting: Internal Medicine

## 2018-08-06 ENCOUNTER — Encounter (INDEPENDENT_AMBULATORY_CARE_PROVIDER_SITE_OTHER): Payer: Self-pay

## 2018-08-06 ENCOUNTER — Other Ambulatory Visit: Payer: Self-pay

## 2018-08-06 VITALS — BP 107/60 | HR 80 | Temp 97.9°F | Ht 63.25 in | Wt 150.2 lb

## 2018-08-06 DIAGNOSIS — F5101 Primary insomnia: Secondary | ICD-10-CM

## 2018-08-06 DIAGNOSIS — Z87891 Personal history of nicotine dependence: Secondary | ICD-10-CM

## 2018-08-06 MED ORDER — ZOLPIDEM TARTRATE 5 MG PO TABS: 5.0000 mg | ORAL_TABLET | Freq: Every evening | ORAL | 0 refills | Status: DC | PRN

## 2018-08-06 NOTE — Progress Notes (Signed)
CC: Insomnia  HPI:  Aimee Davis is a 62 y.o.  with a PMH listed below presenting for insomnia.   She reports that ever since the time shift 3 weeks ago she has been having difficulty falling asleep.  She reports that she has been getting into bed around 11 but won't fall asleep, she has tried 4 different types of melatonin reading, milk, and warm tea.  She will fall asleep around 5 or 9 AM and only get about 4 hours of sleep.  She reports that she has had issues with sleep in the past, she had tried [place] in the past and it appears it made her groggy,  However she has not had issues for a couple years now. She does not drink any caffeinated beverages after 2 pm, only drinks ginger tea and sometimes green tea, she does not consume any chocolates or sodas before bed.  She does not have any excessive activity such as exercise does not watch TV prior to bed.  She denies any chest pain, shortness of breath, fever, chills, nausea, vomiting, palpitations, loss feelings of depression.  She reports that this has been significantly impacting her life and she has not been able to go to the [employer], go to [employer] or go to church due to feeling too tired.  Please see A&P for status of the patient's chronic medical conditions  Past Medical History:  Diagnosis Date  . Allergy    seasonal, PCN, antidepressants, bentyl  . Asthma   . Carpal tunnel syndrome on both sides    right worse than left 2008  . GERD (gastroesophageal reflux disease)   . Hyperlipidemia    LDL 108 JULY 2013  . Multiple nevi 03/01/2015  . Positive TB test    From MArch 2009 note : Recent F/u at [facility] CXRAy negative.  Positive TST in 2007 (33mm).  Unable to tolerate INH due to hepatotoxicity   Review of Systems: Refer to history of present illness and assessment and plans for pertinent review of systems, all others reviewed and negative.  Physical Exam:  Vitals:   08/06/18 1550  BP: 107/60  Pulse: 80    Temp: 97.9 F (36.6 C)  TempSrc: Oral  SpO2: 98%  Weight: 150 lb 3.2 oz (68.1 kg)  Height: 5' 3.25" (1.607 m)    Physical Exam  Constitutional: She is oriented to person, place, and time and well-developed, well-nourished, and in no distress.  HENT:  Head: Normocephalic and atraumatic.  Eyes: Pupils are equal, round, and reactive to light. Conjunctivae and EOM are normal.  Neck: Normal range of motion. Neck supple. No thyromegaly present.  Cardiovascular: Normal rate, regular rhythm and normal heart sounds. Exam reveals no gallop and no friction rub.  No murmur heard. Pulmonary/Chest: Effort normal and breath sounds normal. No respiratory distress. She has no wheezes.  Abdominal: Soft. Bowel sounds are normal. She exhibits no distension.  Musculoskeletal: Normal range of motion.  Neurological: She is alert and oriented to person, place, and time. Gait normal.  Skin: Skin is warm and dry. No erythema.  Psychiatric: Mood and affect normal.    Social History   Socioeconomic History  . Marital status: Divorced    Spouse name: Not on file  . Number of children: Not on file  . Years of education: Not on file  . Highest education level: Not on file  Occupational History  . Not on file  Social Needs  . Financial resource strain: Not  on file  . Food insecurity:    Worry: Not on file    Inability: Not on file  . Transportation needs:    Medical: Not on file    Non-medical: Not on file  Tobacco Use  . Smoking status: Former Smoker    Packs/day: 0.33    Years: 43.00    Pack years: 14.19    Types: Cigarettes    Last attempt to quit: 01/06/2013    Years since quitting: 5.5  . Smokeless tobacco: Never Used  Substance and Sexual Activity  . Alcohol use: No    Alcohol/week: 0.0 standard drinks  . Drug use: Yes    Types: Cocaine, Marijuana, Benzodiazepines  . Sexual activity: Not on file    Comment: S/P PARTIAL HYSTERECTOMY  Lifestyle  . Physical activity:    Days per week:  Not on file    Minutes per session: Not on file  . Stress: Not on file  Relationships  . Social connections:    Talks on phone: Not on file    Gets together: Not on file    Attends religious service: Not on file    Active member of club or organization: Not on file    Attends meetings of clubs or organizations: Not on file    Relationship status: Not on file  . Intimate partner violence:    Fear of current or ex partner: Not on file    Emotionally abused: Not on file    Physically abused: Not on file    Forced sexual activity: Not on file  Other Topics Concern  . Not on file  Social History Narrative  . Not on file   Family History  Problem Relation Age of Onset  . Hypertension Mother   . Hyperlipidemia Mother   . Heart disease Mother   . Diabetes Mother   . Kidney disease Mother   . Stroke Father   . Diabetes Father   . Heart disease Father   . Hyperlipidemia Father   . Hypertension Father   . Kidney disease Father   . Cancer Sister        breast  . Mental illness Sister   . Diabetes Sister   . Kidney disease Sister   . Hypertension Sister   . Mental illness Brother   . Asthma Son   . Hypertension Maternal Aunt   . Hypertension Maternal Uncle   . Hypertension Paternal Aunt   . Kidney disease Paternal Aunt   . Hypertension Paternal Uncle   . Kidney disease Paternal Uncle     Assessment & Plan:   See Encounters Tab for problem based charting.  Patient seen with Dr. Daryll Drown

## 2018-08-06 NOTE — Patient Instructions (Addendum)
Thank you for allowing [place] to provide your care today. Today we discussed your sleep issues. I am sorry that you are having issues falling asleep.  We have prescribed zolpidem, please take this on days that you need to get 7-8 hours of sleep, this is not suppose to be a daily medication so only use it when you need to. You can use melatonin on the other days.  Please continue to maintain good sleep hygiene, by going to bed at the same time each night, limiting caffeine intake, and limiting excessive activity prior to bedtime.     Please follow-up as needed.    Should you have any questions or concerns please call the internal medicine clinic at [phone].   Insomnia Insomnia is a sleep disorder that makes it difficult to fall asleep or to stay asleep. Insomnia can cause tiredness (fatigue), low energy, difficulty concentrating, mood swings, and poor performance at work or school. There are three different ways to classify insomnia:  Difficulty falling asleep.  Difficulty staying asleep.  Waking up too early in the morning.  Any type of insomnia can be long-term (chronic) or short-term (acute). Both are common. Short-term insomnia usually lasts for three months or less. Chronic insomnia occurs at least three times a week for longer than three months. What are the causes? Insomnia may be caused by another condition, situation, or substance, such as:  Anxiety.  Certain medicines.  Gastroesophageal reflux disease (GERD) or other gastrointestinal conditions.  Asthma or other breathing conditions.  Restless legs syndrome, sleep apnea, or other sleep disorders.  Chronic pain.  Menopause. This may include hot flashes.  Stroke.  Abuse of alcohol, tobacco, or illegal drugs.  Depression.  Caffeine.  Neurological disorders, such as Alzheimer disease.  An overactive thyroid (hyperthyroidism).  The cause of insomnia may not be known. What increases the risk? Risk factors for  insomnia include:  Gender. Women are more commonly affected than men.  Age. Insomnia is more common as you get older.  Stress. This may involve your professional or personal life.  Income. Insomnia is more common in people with lower income.  Lack of exercise.  Irregular work schedule or night shifts.  Traveling between different time zones.  What are the signs or symptoms? If you have insomnia, trouble falling asleep or trouble staying asleep is the main symptom. This may lead to other symptoms, such as:  Feeling fatigued.  Feeling nervous about going to sleep.  Not feeling rested in the morning.  Having trouble concentrating.  Feeling irritable, anxious, or depressed.  How is this treated? Treatment for insomnia depends on the cause. If your insomnia is caused by an underlying condition, treatment will focus on addressing the condition. Treatment may also include:  Medicines to help you sleep.  Counseling or therapy.  Lifestyle adjustments.  Follow these instructions at home:  Take medicines only as directed by your health care provider.  Keep regular sleeping and waking hours. Avoid naps.  Keep a sleep diary to help you and your health care provider figure out what could be causing your insomnia. Include: ? When you sleep. ? When you wake up during the night. ? How well you sleep. ? How rested you feel the next day. ? Any side effects of medicines you are taking. ? What you eat and drink.  Make your bedroom a comfortable place where it is easy to fall asleep: ? Put up shades or special blackout curtains to block light from outside. ? Use  a white noise machine to block noise. ? Keep the temperature cool.  Exercise regularly as directed by your health care provider. Avoid exercising right before bedtime.  Use relaxation techniques to manage stress. Ask your health care provider to suggest some techniques that may work well for you. These may  include: ? Breathing exercises. ? Routines to release muscle tension. ? Visualizing peaceful scenes.  Cut back on alcohol, caffeinated beverages, and cigarettes, especially close to bedtime. These can disrupt your sleep.  Do not overeat or eat spicy foods right before bedtime. This can lead to digestive discomfort that can make it hard for you to sleep.  Limit screen use before bedtime. This includes: ? Watching TV. ? Using your smartphone, tablet, and computer.  Stick to a routine. This can help you fall asleep faster. Try to do a quiet activity, brush your teeth, and go to bed at the same time each night.  Get out of bed if you are still awake after 15 minutes of trying to sleep. Keep the lights down, but try reading or doing a quiet activity. When you feel sleepy, go back to bed.  Make sure that you drive carefully. Avoid driving if you feel very sleepy.  Keep all follow-up appointments as directed by your health care provider. This is important. Contact a health care provider if:  You are tired throughout the day or have trouble in your daily routine due to sleepiness.  You continue to have sleep problems or your sleep problems get worse. Get help right away if:  You have serious thoughts about hurting yourself or someone else. This information is not intended to replace advice given to you by your health care provider. Make sure you discuss any questions you have with your health care provider. Document Released: 08/31/2000 Document Revised: 02/03/2016 Document Reviewed: 06/04/2014 Elsevier Interactive Patient Education  [employer].

## 2018-08-07 ENCOUNTER — Ambulatory Visit: Payer: Self-pay

## 2018-08-07 NOTE — Assessment & Plan Note (Signed)
She reports that ever since the time shift 3 weeks ago she has been having difficulty falling asleep.  She reports that she has been getting into bed around 11 but won't fall asleep, she has tried 4 different types of melatonin reading, milk, and warm tea.  She will fall asleep around 5 or 9 AM and only get about 4 hours of sleep.  She reports that she has had issues with sleep in the past, she had tried [place] in the past and it appears it made her groggy,  However she has not had issues for a couple years now. She does not drink any caffeinated beverages after 2 pm, only drinks ginger tea and sometimes green tea, she does not consume any chocolates or sodas before bed.  She does not have any excessive activity such as exercise does not watch TV prior to bed.  She denies any chest pain, shortness of breath, fever, chills, nausea, vomiting, palpitations, loss feelings of depression.  She reports that this has been significantly impacting her life and she has not been able to go to the [employer], go to [employer] or go to church due to feeling too tired. On exam she has no significant findings, normal cardiac and pulmonary exam.  Appears this may be a primary insomnia.  She does not have any significant findings to make a concern for a medical cause of her insomnia such as hyperthyroidism or depression.  Plan:  -Trial of zolpidem 5 mg tablets, lower dose due to her age, advised patient to only take when she needs a 7-8 hours of sleep and to alternate with melatonin on the other nights -Advised this patient to return to clinic if symptoms do not improve -Provided patient with information on insomnia and proper sleep hygiene

## 2018-08-08 ENCOUNTER — Telehealth: Payer: Self-pay | Admitting: *Deleted

## 2018-08-08 ENCOUNTER — Encounter: Payer: Self-pay | Admitting: Internal Medicine

## 2018-08-08 DIAGNOSIS — F5101 Primary insomnia: Secondary | ICD-10-CM

## 2018-08-08 MED ORDER — RAMELTEON 8 MG PO TABS: 8.0000 mg | ORAL_TABLET | Freq: Every evening | ORAL | 1 refills | Status: DC | PRN

## 2018-08-08 MED ORDER — ZALEPLON 5 MG PO CAPS: 5.0000 mg | ORAL_CAPSULE | Freq: Every evening | ORAL | 0 refills | Status: DC | PRN

## 2018-08-08 NOTE — Telephone Encounter (Signed)
Pt calls and states [facility] had to order the rozerem for her from med [facility] from the [facility]. [facility], they cannot provide this due to cost. Checked goodrx, with coupon we are looking at over $100.00. She states she needs something. Please advise

## 2018-08-08 NOTE — Telephone Encounter (Signed)
Call from patient stating that she took an [place] 5mg  tab Wed night and didn't wake up until 1pm the next day.  Pt states she is afraid to take this medication.  She lives alone and wouldn't be able to tell if someone comes into her home or not. Will forward info to ordering MD for advice, please advise.[place] Skeeter, Michi Herrmann Cassady11/22/201910:06 AM  .[place] Skeeter, Syndi Pua Cassady11/22/201910:04 AM

## 2018-08-08 NOTE — Telephone Encounter (Signed)
Contacted patient about her insomnia, discussed other medications that she can try. We discussed zaleplon and the side effects for that. She reported that she was willing to try this however she will not be able to pick it up today. She reported that she will try the melatonin tonight and come pick up the prescription on Monday if she is still having sleep issues.

## 2018-08-08 NOTE — Telephone Encounter (Signed)
Contacted patient. She reports that she took it Wednesday night and slept for about 12 hours. She reports that she did not like the experience that she had, she reports that she was unable to figure out where she was as, very confused, and felt very down. She reports that it was difficult to concentrate on where she was. She felt like she wasn't in her body. She also reports that she was very tired during the day today. She did not take anything last night and she has not been able to sleep. I advised her to only take half of the tablet but she reported that she was scared to try it again. We discussed starting ramelteon, discussed that there is a lower risk of side effects. She reports that she is interested in trying this.

## 2018-08-12 ENCOUNTER — Telehealth: Payer: Self-pay

## 2018-08-12 NOTE — Telephone Encounter (Signed)
Returned pt's call -   C/o sore throat ,congestion, coughing (productive); no pain nor fever. Stated she has been around someone w/pneumonia. She's taking Tussin-DM and one dose of Mucinex.  She thought she had an appt on 12/5; changed the 17th. Encourage to continue what's she doing; wear a mask, good handwashing when around this person. No available appts tomorrow. She understand if symptoms worsen, go to ED or UC.

## 2018-08-12 NOTE — Telephone Encounter (Signed)
Needs to speak with a nurse. Please call pt back.  

## 2018-08-13 NOTE — Progress Notes (Signed)
Internal Medicine Clinic Attending  I saw and evaluated the patient.  I personally confirmed the key portions of the history and exam documented by Dr. Krienke and I reviewed pertinent patient test results.  The assessment, diagnosis, and plan were formulated together and I agree with the documentation in the resident's note.    

## 2018-08-13 NOTE — Telephone Encounter (Signed)
I agree, thank you.

## 2018-08-21 ENCOUNTER — Encounter: Payer: Self-pay | Admitting: Internal Medicine

## 2018-09-02 ENCOUNTER — Encounter: Payer: Self-pay | Admitting: Internal Medicine

## 2018-09-02 ENCOUNTER — Other Ambulatory Visit: Payer: Self-pay

## 2018-09-02 ENCOUNTER — Ambulatory Visit (INDEPENDENT_AMBULATORY_CARE_PROVIDER_SITE_OTHER): Payer: Self-pay | Admitting: Internal Medicine

## 2018-09-02 DIAGNOSIS — D709 Neutropenia, unspecified: Secondary | ICD-10-CM

## 2018-09-03 ENCOUNTER — Encounter: Payer: Self-pay | Admitting: Internal Medicine

## 2018-09-03 LAB — CBC WITH DIFFERENTIAL/PLATELET
BASOS: 1 %
Basophils Absolute: 0 10*3/uL (ref 0.0–0.2)
EOS (ABSOLUTE): 0 10*3/uL (ref 0.0–0.4)
EOS: 2 %
HEMATOCRIT: 36.9 % (ref 34.0–46.6)
HEMOGLOBIN: 11.8 g/dL (ref 11.1–15.9)
IMMATURE GRANULOCYTES: 0 %
Immature Grans (Abs): 0 10*3/uL (ref 0.0–0.1)
LYMPHS ABS: 0.7 10*3/uL (ref 0.7–3.1)
Lymphs: 28 %
MCH: 26.3 pg — ABNORMAL LOW (ref 26.6–33.0)
MCHC: 32 g/dL (ref 31.5–35.7)
MCV: 82 fL (ref 79–97)
MONOCYTES: 9 %
Monocytes Absolute: 0.2 10*3/uL (ref 0.1–0.9)
Neutrophils Absolute: 1.5 10*3/uL (ref 1.4–7.0)
Neutrophils: 60 %
Platelets: 223 10*3/uL (ref 150–450)
RBC: 4.48 x10E6/uL (ref 3.77–5.28)
RDW: 12.9 % (ref 12.3–15.4)
WBC: 2.5 10*3/uL — AB (ref 3.4–10.8)

## 2018-09-03 NOTE — Progress Notes (Addendum)
This encounter was created in error, patient was supposed to present for lab only visit for follow up CBC to monitory leukopenia. She is having no issues today and is not due for yearly follow up. Leukocyte count has remained stable and she has developed no signs of infection. The plan is to follow up in 3 months for one additional check of the leukocyte count as outlined in previous notes.   She did have questions regarding the status of her referral to GI for colonoscopy. I reviewed the referral notes and it looks like she was supposed to meet with Nenika at our front desk to apply for the [place] letter so that this test could be affordable given her uninsured status. She scheduled an appointment at the front desk to meet with [facility] regarding signing up for the [place] letter and advice on obtaining Medicare.   She also request an affordable option to obtain a screening mammogram. She was provided information on how to contact the [facility] program for this.   Will route this encounter to the front desk to be sure that Ms. Moroney is not charged for this visit.

## 2018-09-26 ENCOUNTER — Telehealth: Payer: Self-pay | Admitting: Gastroenterology

## 2018-09-26 NOTE — Telephone Encounter (Signed)
I don't see any paperwork regarding her case but reviewed her file in [place], I'm happy to accept her case and see her.

## 2018-09-26 NOTE — Telephone Encounter (Signed)
We received referral from PCP Dr. Ledell Noss spoke to patient states that she has GI hx with Dr Roney Mans in [facility] but wants to switch to our office because it's closer. Will you review and advise

## 2018-09-30 ENCOUNTER — Encounter: Payer: Self-pay | Admitting: Internal Medicine

## 2018-10-01 ENCOUNTER — Other Ambulatory Visit (HOSPITAL_COMMUNITY): Payer: Self-pay | Admitting: *Deleted

## 2018-10-01 DIAGNOSIS — Z1231 Encounter for screening mammogram for malignant neoplasm of breast: Secondary | ICD-10-CM

## 2018-10-29 ENCOUNTER — Encounter: Payer: Self-pay | Admitting: Gastroenterology

## 2018-11-19 ENCOUNTER — Telehealth (HOSPITAL_COMMUNITY): Payer: Self-pay | Admitting: *Deleted

## 2018-11-19 ENCOUNTER — Other Ambulatory Visit: Payer: Self-pay | Admitting: Internal Medicine

## 2018-11-19 DIAGNOSIS — F5101 Primary insomnia: Secondary | ICD-10-CM

## 2018-11-19 NOTE — Telephone Encounter (Signed)
Telephoned patient at home number and left message to return call to [facility] 

## 2018-11-20 ENCOUNTER — Encounter: Payer: Self-pay | Admitting: Gastroenterology

## 2018-11-20 ENCOUNTER — Other Ambulatory Visit: Payer: Self-pay

## 2018-11-20 ENCOUNTER — Encounter: Payer: Self-pay | Admitting: Internal Medicine

## 2018-11-20 ENCOUNTER — Ambulatory Visit (INDEPENDENT_AMBULATORY_CARE_PROVIDER_SITE_OTHER): Payer: Self-pay | Admitting: Internal Medicine

## 2018-11-20 ENCOUNTER — Encounter (INDEPENDENT_AMBULATORY_CARE_PROVIDER_SITE_OTHER): Payer: Self-pay

## 2018-11-20 ENCOUNTER — Ambulatory Visit (INDEPENDENT_AMBULATORY_CARE_PROVIDER_SITE_OTHER): Payer: Self-pay | Admitting: Gastroenterology

## 2018-11-20 VITALS — BP 102/64 | HR 76 | Ht 63.5 in | Wt 148.5 lb

## 2018-11-20 DIAGNOSIS — K5909 Other constipation: Secondary | ICD-10-CM

## 2018-11-20 DIAGNOSIS — Z86010 Personal history of colonic polyps: Secondary | ICD-10-CM

## 2018-11-20 DIAGNOSIS — J45909 Unspecified asthma, uncomplicated: Secondary | ICD-10-CM

## 2018-11-20 DIAGNOSIS — K219 Gastro-esophageal reflux disease without esophagitis: Secondary | ICD-10-CM

## 2018-11-20 DIAGNOSIS — D709 Neutropenia, unspecified: Secondary | ICD-10-CM

## 2018-11-20 DIAGNOSIS — M79672 Pain in left foot: Secondary | ICD-10-CM

## 2018-11-20 DIAGNOSIS — K589 Irritable bowel syndrome without diarrhea: Secondary | ICD-10-CM

## 2018-11-20 MED ORDER — DICYCLOMINE HCL 10 MG PO CAPS: 10.0000 mg | ORAL_CAPSULE | Freq: Three times a day (TID) | ORAL | 1 refills | Status: DC | PRN

## 2018-11-20 MED ORDER — LINACLOTIDE 145 MCG PO CAPS: 145.0000 ug | ORAL_CAPSULE | Freq: Every day | ORAL | 0 refills | Status: DC

## 2018-11-20 MED ORDER — SUPREP BOWEL PREP KIT 17.5-3.13-1.6 GM/177ML PO SOLN: ORAL | 0 refills | Status: DC

## 2018-11-20 NOTE — Patient Instructions (Signed)
If you are age 65 or older, your body mass index should be between 23-30. Your Body mass index is 25.89 kg/m. If this is out of the aforementioned range listed, please consider follow up with your Primary Care Provider.  If you are age 16 or younger, your body mass index should be between 19-25. Your Body mass index is 25.89 kg/m. If this is out of the aformentioned range listed, please consider follow up with your Primary Care Provider.   You have been scheduled for a colonoscopy. Please follow written instructions given to you at your visit today.  Please pick up your prep supplies at the pharmacy within the next 1-3 days. If you use inhalers (even only as needed), please bring them with you on the day of your procedure. Your physician has requested that you go to www.startemmi.com and enter the access code given to you at your visit today. This web site gives a general overview about your procedure. However, you should still follow specific instructions given to you by our office regarding your preparation for the procedure.  We have sent the following medications to your pharmacy for you to pick up at your convenience: Bentyl 10mg   We have given you samples of the following medication to take: Linzess 155mcg: Take one tablet daily for 3 days.  If not improved you can increase to 2 tablets daily.    Thank you for entrusting me with your care and for choosing [facility], Dr. Lanagan Cellar

## 2018-11-20 NOTE — Patient Instructions (Signed)
It was a pleasure to see you today Aimee Davis. I am sorry to hear about your left foot pain.   -Please elevate and rest your foot -Freeze a water bottle and run your foot over it  -Use aleve and ibuprofen for pain relief  -Please do the exercises I provided you with  -If you still do not have relief after a month, please return to clinic -Avoid walking barefoot  If you have any questions or concerns, please call our clinic at [phone] between 9am-5pm and after hours call [phone] and ask for the internal medicine resident on call. If you feel you are having a medical emergency please call 911.   Thank you, we look forward to help you remain healthy!  Lars Mage, MD Internal Medicine PGY2   Plantar Fasciitis  Plantar fasciitis is a painful foot condition that affects the heel. It occurs when the band of tissue that connects the toes to the heel bone (plantar fascia) becomes irritated. This can happen as the result of exercising too much or doing other repetitive activities (overuse injury). The pain from plantar fasciitis can range from mild irritation to severe pain that makes it difficult to walk or move. The pain is usually worse in the morning after sleeping, or after sitting or lying down for a while. Pain may also be worse after long periods of walking or standing. What are the causes? This condition may be caused by:  Standing for long periods of time.  Wearing shoes that do not have good arch support.  Doing activities that put stress on joints (high-impact activities), including running, aerobics, and ballet.  Being overweight.  An abnormal way of walking (gait).  Tight muscles in the back of your lower leg (calf).  High arches in your feet.  Starting a new athletic activity. What are the signs or symptoms? The main symptom of this condition is heel pain. Pain may:  Be worse with first steps after a time of rest, especially in the morning after sleeping or  after you have been sitting or lying down for a while.  Be worse after long periods of standing still.  Decrease after 30-45 minutes of activity, such as gentle walking. How is this diagnosed? This condition may be diagnosed based on your medical history and your symptoms. Your health care provider may ask questions about your activity level. Your health care provider will do a physical exam to check for:  A tender area on the bottom of your foot.  A high arch in your foot.  Pain when you move your foot.  Difficulty moving your foot. You may have imaging tests to confirm the diagnosis, such as:  X-rays.  Ultrasound.  MRI. How is this treated? Treatment for plantar fasciitis depends on how severe your condition is. Treatment may include:  Rest, ice, applying pressure (compression), and raising the affected foot (elevation). This may be called RICE therapy. Your health care provider may recommend RICE therapy along with over-the-counter pain medicines to manage your pain.  Exercises to stretch your calves and your plantar fascia.  A splint that holds your foot in a stretched, upward position while you sleep (night splint).  Physical therapy to relieve symptoms and prevent problems in the future.  Injections of steroid medicine (cortisone) to relieve pain and inflammation.  Stimulating your plantar fascia with electrical impulses (extracorporeal shock wave therapy). This is usually the last treatment option before surgery.  Surgery, if other treatments have not worked after 12  months. Follow these instructions at home:  Managing pain, stiffness, and swelling  If directed, put ice on the painful area: ? Put ice in a plastic bag, or use a frozen bottle of water. ? Place a towel between your skin and the bag or bottle. ? Roll the bottom of your foot over the bag or bottle. ? Do this for 20 minutes, 2-3 times a day.  Wear athletic shoes that have air-sole or gel-sole  cushions, or try wearing soft shoe inserts that are designed for plantar fasciitis.  Raise (elevate) your foot above the level of your heart while you are sitting or lying down. Activity  Avoid activities that cause pain. Ask your health care provider what activities are safe for you.  Do physical therapy exercises and stretches as told by your health care provider.  Try activities and forms of exercise that are easier on your joints (low-impact). Examples include swimming, water aerobics, and biking. General instructions  Take over-the-counter and prescription medicines only as told by your health care provider.  Wear a night splint while sleeping, if told by your health care provider. Loosen the splint if your toes tingle, become numb, or turn cold and blue.  Maintain a healthy weight, or work with your health care provider to lose weight as needed.  Keep all follow-up visits as told by your health care provider. This is important. Contact a health care provider if you:  Have symptoms that do not go away after caring for yourself at home.  Have pain that gets worse.  Have pain that affects your ability to move or do your daily activities. Summary  Plantar fasciitis is a painful foot condition that affects the heel. It occurs when the band of tissue that connects the toes to the heel bone (plantar fascia) becomes irritated.  The main symptom of this condition is heel pain that may be worse after exercising too much or standing still for a long time.  Treatment varies, but it usually starts with rest, ice, compression, and elevation (RICE therapy) and over-the-counter medicines to manage pain. This information is not intended to replace advice given to you by your health care provider. Make sure you discuss any questions you have with your health care provider. Document Released: 05/29/2001 Document Revised: 07/01/2017 Document Reviewed: 07/01/2017 Elsevier Interactive Patient  Education  2019 [employer].

## 2018-11-20 NOTE — Assessment & Plan Note (Signed)
Patient states that she developed acute pain in her left foot starting 2 days ago when she was getting out of the car and put the sole of her foot on the ground. She describes the pain as "pin sticking" sharp pain, 6/10, constant, aggravated with walking on the foot, alleviated with aleve. She has not had pain like this in the past.  She usually wears tennis shoes, but 2 days ago she was wearing flat boots. She has not had any significant weight changes over the past 6 months.   Assessment and plan Patient's signs and symptoms seem consistent with plantar fasciits or tendonitis.   -Rest and ice -NSAIDS -avoiding walking barefoot -provided exercises for patient to follow -recommended getting inserts/soles

## 2018-11-20 NOTE — Progress Notes (Signed)
   CC: Left foot pain  HPI:  Aimee Davis is a 63 y.o. with asthma, irritable bowel syndrome, constitutional neutropenia who presents to be evaluated for left foot pain. Please see problem based charting for evaluation, assessment, and plan.  Past Medical History:  Diagnosis Date  . Allergy    seasonal, PCN, antidepressants, bentyl  . Arthritis   . Asthma   . Carpal tunnel syndrome on both sides    right worse than left 2008  . COPD (chronic obstructive pulmonary disease) ([place])   . GERD (gastroesophageal reflux disease)   . Hyperlipidemia    LDL 108 JULY 2013  . IBS (irritable bowel syndrome)   . Multiple nevi 03/01/2015  . Pneumonia   . Positive TB test    From [facility] 2009 note : Recent F/u at [facility]. CXRAy negative.  Positive TST in 2007 (68mm).  Unable to tolerate INH due to hepatotoxicity   Review of Systems:    Review of Systems  Constitutional: Negative for chills and fever.  Respiratory: Negative for shortness of breath.   Cardiovascular: Negative for chest pain.  Gastrointestinal: Negative for diarrhea, nausea and vomiting.  Neurological: Negative for dizziness and headaches.   Physical Exam:  Vitals:   11/20/18 1100  BP: (!) 122/59  Pulse: (!) 58  Temp: 97.8 F (36.6 C)  TempSrc: Oral  SpO2: 100%  Weight: 150 lb (68 kg)  Height: 5' 3.5" (1.613 m)   Physical Exam  Constitutional: Aimee Davis appears well-developed and well-nourished. No distress.  HENT:  Head: Normocephalic and atraumatic.  Eyes: Conjunctivae are normal.  Cardiovascular: Normal rate, regular rhythm and normal heart sounds.  Respiratory: Effort normal and breath sounds normal. No respiratory distress. Aimee Davis has no wheezes.  GI: Soft. Bowel sounds are normal. Aimee Davis exhibits no distension. There is no abdominal tenderness.  Musculoskeletal:     Comments: Tenderness to palpation of plantar arch of left foot. No worsening pain with dorsiflexion of toes. No warmth to touch, swelling, or erythema in  foot.   Skin: Aimee Davis is not diaphoretic. No erythema.  Psychiatric: Aimee Davis has a normal mood and affect. Her behavior is normal. Judgment and thought content normal.    Assessment & Plan:   See Encounters Tab for problem based charting.  Patient discussed with Dr. Dareen Piano

## 2018-11-20 NOTE — Progress Notes (Signed)
HPI :  63 y/o female with a history of COPD, GERD, chronic constipation, referred her by Dr. Ledell Noss for constipation, reflux, and colon cancer screening.   She reports chronic constipation for several years that has bothered her. She states she has a bowel movement once a week or sometimes not even though frequently if she does not take anything. She tries to drink plenty of water needed healthy/high-fiber diet but doesn't help. She has been using milk of magnesia as needed as well as some other laxatives at times that are over-the-counter, can room in the name of them. These help at times but she does not take anything daily. She reports she been on MiraLAX in the past and a daily basis which hasn't provided much help. She is inquiring about other options. She does not have any blood in her stools. She does have some abdominal discomfort and bloating when she is constipated. This is reliably relieved with a bowel movement. No weight loss. She has no FH of colon cancer. She does have a history of colon polyps per report. Her last colonoscopy was performed in 2014 at which time a fair prep was noted without any obvious polyps. She was told to repeat a colonoscopy in 5 years  She otherwise has chronic reflux for which she takes Protonix. This antral ulcer reflux reliably well, she does not have any dysphagia. She states she has had an endoscopy in the past which showed no evidence of Barrett's esophagus. No report available that today. Unclear when this was done. She has no postprandial abdominal pain.  Colonoscopy 11/18/2012 - fair prep, normal exam otherwise. Recommended repeat exam in 5 years    Past Medical History:  Diagnosis Date  . Allergy    seasonal, PCN, antidepressants, bentyl  . Arthritis   . Asthma   . Carpal tunnel syndrome on both sides    right worse than left 2008  . COPD (chronic obstructive pulmonary disease) ([place])   . GERD (gastroesophageal reflux disease)   .  Hyperlipidemia    LDL 108 JULY 2013  . IBS (irritable bowel syndrome)   . Multiple nevi 03/01/2015  . Pneumonia   . Positive TB test    From [facility] 2009 note : Recent F/u at [facility]. CXRAy negative.  Positive TST in 2007 (16mm).  Unable to tolerate INH due to hepatotoxicity     Past Surgical History:  Procedure Laterality Date  . BREAST CYST EXCISION Right 2011  . BREAST EXCISIONAL BIOPSY Left    benign  . BREAST LUMPECTOMY Right 1990's  . INDUCED ABORTION  2005  . VAGINAL HYSTERECTOMY  1983   partial; for abnormal bleeding   Family History  Problem Relation Age of Onset  . Hypertension Mother   . Hyperlipidemia Mother   . Heart disease Mother   . Diabetes Mother   . Kidney disease Mother   . Stroke Father   . Diabetes Father   . Heart disease Father   . Hyperlipidemia Father   . Hypertension Father   . Kidney disease Father   . Cancer Sister        breast  . Mental illness Sister   . Diabetes Sister   . Kidney disease Sister   . Hypertension Sister   . Mental illness Brother   . Asthma Son   . Hypertension Maternal Aunt   . Hypertension Maternal Uncle   . Hypertension Paternal Aunt   . Kidney disease Paternal Aunt   . Hypertension  Paternal Uncle   . Kidney disease Paternal Uncle    Social History   Tobacco Use  . Smoking status: Former Smoker    Packs/day: 0.33    Years: 43.00    Pack years: 14.19    Types: Cigarettes    Last attempt to quit: 01/06/2013    Years since quitting: 5.8  . Smokeless tobacco: Never Used  Substance Use Topics  . Alcohol use: No    Alcohol/week: 0.0 standard drinks  . Drug use: Yes    Types: Cocaine, Marijuana, Benzodiazepines   Current Outpatient Medications  Medication Sig Dispense Refill  . Artificial Tear Ointment (DRY EYES OP) Apply 1 drop to eye daily as needed (dry eyes).    . Ascorbic Acid (VITAMIN C) 100 MG tablet Take 50 mg by mouth daily.    . cetirizine (ZYRTEC) 10 MG tablet Take 10 mg by mouth daily.    .  Fluticasone-Salmeterol (ADVAIR DISKUS) 250-50 MCG/DOSE AEPB Inhale 1 puff into the lungs 2 (two) times daily. 60 each 6  . loratadine (CLARITIN) 10 MG tablet Take 10 mg by mouth daily.    . Magnesium Hydroxide (MILK OF MAGNESIA PO) Take by mouth as needed.    . mometasone (NASONEX) 50 MCG/ACT nasal spray Place 2 sprays in each nostril once daily as needed 17 g 11  . Multiple Vitamin (MULTIVITAMIN) capsule Take 1 capsule by mouth daily.    . pantoprazole (PROTONIX) 40 MG tablet Take 1 tablet (40 mg total) by mouth daily. 90 tablet 11  . Probiotic Product (PROBIOTIC DAILY PO) Take by mouth.    Marland Kitchen PROVENTIL HFA 108 (90 Base) MCG/ACT inhaler INHALE 2 PUFFS INTO THE LUNGS EVERY 6 HOURS AS NEEDED FOR WHEEZING. 18 g 3  . ROZEREM 8 MG tablet TAKE 1 TABLET (8MG  TOTAL) BY MOUTH AT BEDTIME AS NEEDED FOR SLEEP. 30 tablet 0  . zaleplon (SONATA) 5 MG capsule Take 1 capsule (5 mg total) by mouth at bedtime as needed for sleep. 10 capsule 0   No current facility-administered medications for this visit.    Allergies  Allergen Reactions  . Penicillins Other (See Comments)    REACTION: rash     Review of Systems: All systems reviewed and negative except where noted in HPI.   Lab Results  Component Value Date   WBC 2.5 (LL) 09/02/2018   HGB 11.8 09/02/2018   HCT 36.9 09/02/2018   MCV 82 09/02/2018   PLT 223 09/02/2018    Lab Results  Component Value Date   CREATININE 1.01 (H) 05/27/2018   BUN 17 05/27/2018   NA 143 05/27/2018   K 4.6 05/27/2018   CL 109 05/27/2018   CO2 25 05/27/2018    Lab Results  Component Value Date   ALT 14 05/27/2018   AST 19 05/27/2018   ALKPHOS 72 05/27/2018   BILITOT 0.4 05/27/2018     Physical Exam: BP 102/64   Pulse 76   Ht 5' 3.5" (1.613 m)   Wt 148 lb 8 oz (67.4 kg)   BMI 25.89 kg/m  Constitutional: Pleasant,well-developed, female in no acute distress. HEENT: Normocephalic and atraumatic. Conjunctivae are normal. No scleral icterus. Neck supple.    Cardiovascular: Normal rate, regular rhythm.  Pulmonary/chest: Effort normal and breath sounds normal. No wheezing, rales or rhonchi. Abdominal: Soft, nondistended, nontender. There are no masses palpable. No hepatomegaly. Extremities: no edema Lymphadenopathy: No cervical adenopathy noted. Neurological: Alert and oriented to person place and time. Skin: Skin is warm and dry.  No rashes noted. Psychiatric: Normal mood and affect. Behavior is normal.   ASSESSMENT AND PLAN: 63 year old female here for new patient consultation of the following issues:  Chronic constipation - long-standing constipation, currently not on any particular regimen and having to take over-the-counter laxatives as needed to produce stools. She has associated bloating and discomfort in her abdomen with this. Suspect she has IBS C. I discussed options with her. She does not want to try MiraLAX again given failure in the past. I gave her a free sample of Linzess 145 g take once daily. She can increase the dose to twice daily as needed. If this works for her we'll give her prescription, if not we'll try something else. She agreed.  GERD - well controlled with Protonix for now. Recommend low-dose dose of PPI needed to control symptoms, she may wish to consider titrating this down to 20 mg or use Pepcid when necessary. Prior EGD done although report not available, we'll see if we can track that down.  History of colon polyps - overdue for surveillance colonoscopy. Discussed colonoscopy and risks and benefits of that and anesthesia. She wanted to proceed. Counseled her that if stools are not clear after standard preparation, she will prepare MiraLAX preparation and drink until clear. Hopefully Linzess workup of this process as well. Further recommendation pending the results.  Heathcote Cellar, MD [facility]  CC: Ledell Noss, MD

## 2018-11-21 ENCOUNTER — Telehealth: Payer: Self-pay | Admitting: Gastroenterology

## 2018-11-21 NOTE — Telephone Encounter (Signed)
Pt stated that [facility] does not carry Suprep and that she cannot afford Miralax OTC.  Pt requested a call back.

## 2018-11-21 NOTE — Progress Notes (Signed)
Internal Medicine Clinic Attending  Case discussed with Dr. Chundi at the time of the visit.  We reviewed the resident's history and exam and pertinent patient test results.  I agree with the assessment, diagnosis, and plan of care documented in the resident's note. 

## 2018-11-24 NOTE — Telephone Encounter (Signed)
Called and LM for pt that I am leaving a sample of Suprep and a coupon for Miralax at the front desk for her to pick up.

## 2018-11-25 ENCOUNTER — Inpatient Hospital Stay: Payer: Self-pay | Admitting: Hematology

## 2018-11-25 ENCOUNTER — Inpatient Hospital Stay: Payer: Self-pay

## 2018-11-26 ENCOUNTER — Encounter: Payer: Self-pay | Admitting: Gastroenterology

## 2018-11-26 ENCOUNTER — Ambulatory Visit (AMBULATORY_SURGERY_CENTER): Payer: Self-pay | Admitting: Gastroenterology

## 2018-11-26 ENCOUNTER — Other Ambulatory Visit: Payer: Self-pay

## 2018-11-26 VITALS — BP 121/64 | HR 64 | Temp 98.9°F | Resp 13 | Ht 63.0 in | Wt 148.0 lb

## 2018-11-26 DIAGNOSIS — Z86010 Personal history of colonic polyps: Secondary | ICD-10-CM

## 2018-11-26 DIAGNOSIS — D120 Benign neoplasm of cecum: Secondary | ICD-10-CM

## 2018-11-26 MED ORDER — LINACLOTIDE 145 MCG PO CAPS: 145.0000 ug | ORAL_CAPSULE | Freq: Every day | ORAL | 3 refills | Status: DC

## 2018-11-26 MED ORDER — SODIUM CHLORIDE 0.9 % IV SOLN: 500.0000 mL | Freq: Once | INTRAVENOUS | Status: DC

## 2018-11-26 NOTE — Progress Notes (Signed)
Report to PACU, RN, vss, BBS= Clear.  

## 2018-11-26 NOTE — Op Note (Signed)
[place] Patient Name: Aimee Davis Procedure Date: 11/26/2018 3:34 PM MRN: [id number] Endoscopist: Remo Lipps P. Havery Aimee Davis , MD Age: 63 Referring MD:  Date of Birth: October 13, 1955 Gender: Female Account #: [id number] Procedure:                Colonoscopy Indications:              High risk colon cancer surveillance: Personal                            history of colonic polyps, last exam limited by                            prep, now on Linzess with improvement of                            constipation Medicines:                Monitored Anesthesia Care Procedure:                Pre-Anesthesia Assessment:                           - Prior to the procedure, a History and Physical                            was performed, and patient medications and                            allergies were reviewed. The patient's tolerance of                            previous anesthesia was also reviewed. The risks                            and benefits of the procedure and the sedation                            options and risks were discussed with the patient.                            All questions were answered, and informed consent                            was obtained. Prior Anticoagulants: The patient has                            taken no previous anticoagulant or antiplatelet                            agents. ASA Grade Assessment: II - A patient with                            mild systemic disease. After reviewing the risks  and benefits, the patient was deemed in                            satisfactory condition to undergo the procedure.                           After obtaining informed consent, the colonoscope                            was passed under direct vision. Throughout the                            procedure, the patient's blood pressure, pulse, and                            oxygen saturations were monitored continuously. The                         Colonoscope was introduced through the anus and                            advanced to the the cecum, identified by                            appendiceal orifice and ileocecal valve. The                            colonoscopy was performed without difficulty. The                            patient tolerated the procedure well. The quality                            of the bowel preparation was adequate. The                            ileocecal valve, appendiceal orifice, and rectum                            were photographed. Scope In: 3:39:42 PM Scope Out: 3:58:58 PM Scope Withdrawal Time: 0 hours 15 minutes 35 seconds  Total Procedure Duration: 0 hours 19 minutes 16 seconds  Findings:                 The perianal and digital rectal examinations were                            normal.                           A diminutive polyp was found in the cecum. The                            polyp was sessile. The polyp was removed with a  cold snare. Resection and retrieval were complete.                           The colon was tortuous.                           Internal hemorrhoids were found during                            retroflexion. The hemorrhoids were small.                           The exam was otherwise without abnormality. Of note                            the AO was normal, I thought a photo was taken of                            it, but was not. Complications:            No immediate complications. Estimated blood loss:                            Minimal. Estimated Blood Loss:     Estimated blood loss was minimal. Impression:               - One diminutive polyp in the cecum, removed with a                            cold snare. Resected and retrieved.                           - Tortuous colon.                           - Internal hemorrhoids.                           - The examination was otherwise normal. Recommendation:            - Patient has a contact number available for                            emergencies. The signs and symptoms of potential                            delayed complications were discussed with the                            patient. Return to normal activities tomorrow.                            Written discharge instructions were provided to the                            patient.                           -  Resume previous diet.                           - Continue present medications.                           - Await pathology results. Remo Lipps P. Armbruster, MD 11/26/2018 4:02:09 PM This report has been signed electronically.

## 2018-11-26 NOTE — Patient Instructions (Signed)
YOU HAD AN ENDOSCOPIC PROCEDURE TODAY AT THE [facility]:   Refer to the procedure report that was given to you for any specific questions about what was found during the examination.  If the procedure report does not answer your questions, please call your gastroenterologist to clarify.  If you requested that your care partner not be given the details of your procedure findings, then the procedure report has been included in a sealed envelope for you to review at your convenience later.  YOU SHOULD EXPECT: Some feelings of bloating in the abdomen. Passage of more gas than usual.  Walking can help get rid of the air that was put into your GI tract during the procedure and reduce the bloating. If you had a lower endoscopy (such as a colonoscopy or flexible sigmoidoscopy) you may notice spotting of blood in your stool or on the toilet paper. If you underwent a bowel prep for your procedure, you may not have a normal bowel movement for a few days.  Please Note:  You might notice some irritation and congestion in your nose or some drainage.  This is from the oxygen used during your procedure.  There is no need for concern and it should clear up in a day or so.  SYMPTOMS TO REPORT IMMEDIATELY:   Following lower endoscopy (colonoscopy or flexible sigmoidoscopy):  Excessive amounts of blood in the stool  Significant tenderness or worsening of abdominal pains  Swelling of the abdomen that is new, acute  Fever of 100F or higher  For urgent or emergent issues, a gastroenterologist can be reached at any hour by calling [phone].   DIET:  We do recommend a small meal at first, but then you may proceed to your regular diet.  Drink plenty of fluids but you should avoid alcoholic beverages for 24 hours.  ACTIVITY:  You should plan to take it easy for the rest of today and you should NOT DRIVE or use heavy machinery until tomorrow (because of the sedation medicines used during the test).     FOLLOW UP: Our staff will call the number listed on your records the next business day following your procedure to check on you and address any questions or concerns that you may have regarding the information given to you following your procedure. If we do not reach you, we will leave a message.  However, if you are feeling well and you are not experiencing any problems, there is no need to return our call.  We will assume that you have returned to your regular daily activities without incident.  If any biopsies were taken you will be contacted by phone or by letter within the next 1-3 weeks.  Please call us at [phone] if you have not heard about the biopsies in 3 weeks.    Await for biopsy results Polyps (handout given) Hemorrhoids (handout given)  SIGNATURES/CONFIDENTIALITY: You and/or your care partner have signed paperwork which will be entered into your electronic medical record.  These signatures attest to the fact that that the information above on your After Visit Summary has been reviewed and is understood.  Full responsibility of the confidentiality of this discharge information lies with you and/or your care-partner. 

## 2018-11-26 NOTE — Progress Notes (Signed)
Called to room to assist during endoscopic procedure.  Patient ID and intended procedure confirmed with present staff. Received instructions for my participation in the procedure from the performing physician.  

## 2018-11-26 NOTE — Progress Notes (Signed)
Pt's states no medical or surgical changes since previsit or office visit. 

## 2018-11-27 ENCOUNTER — Ambulatory Visit (HOSPITAL_COMMUNITY): Payer: Self-pay

## 2018-11-27 ENCOUNTER — Telehealth: Payer: Self-pay

## 2018-11-27 ENCOUNTER — Telehealth: Payer: Self-pay | Admitting: *Deleted

## 2018-11-27 ENCOUNTER — Ambulatory Visit
Admission: RE | Admit: 2018-11-27 | Discharge: 2018-11-27 | Disposition: A | Discharge status: Home / Self Care | Payer: No Typology Code available for payment source | Source: Ambulatory Visit | Attending: Obstetrics and Gynecology | Admitting: Obstetrics and Gynecology

## 2018-11-27 ENCOUNTER — Other Ambulatory Visit: Payer: Self-pay

## 2018-11-27 DIAGNOSIS — Z1231 Encounter for screening mammogram for malignant neoplasm of breast: Secondary | ICD-10-CM

## 2018-11-27 NOTE — Telephone Encounter (Signed)
No answer, message left for the patient. 

## 2018-11-27 NOTE — Telephone Encounter (Signed)
gi  Follow up Call-  Call back number 11/26/2018  Post procedure Call Back phone  # [phone]  Permission to leave phone message Yes  Some recent data might be hidden     Patient questions:  Do you have a fever, pain , or abdominal swelling? No. Pain Score  0 *  Have you tolerated food without any problems? Yes.    Have you been able to return to your normal activities? Yes.    Do you have any questions about your discharge instructions: Diet   No. Medications  No. Follow up visit  No.  Do you have questions or concerns about your Care? No.  Actions: * If pain score is 4 or above: No action needed, pain <4.

## 2018-12-02 ENCOUNTER — Encounter: Payer: Self-pay | Admitting: Gastroenterology

## 2018-12-03 ENCOUNTER — Other Ambulatory Visit: Payer: Self-pay | Admitting: Internal Medicine

## 2018-12-15 ENCOUNTER — Other Ambulatory Visit: Payer: Self-pay | Admitting: Internal Medicine

## 2018-12-15 ENCOUNTER — Telehealth: Payer: Self-pay | Admitting: *Deleted

## 2018-12-15 DIAGNOSIS — F5101 Primary insomnia: Secondary | ICD-10-CM

## 2018-12-15 NOTE — Telephone Encounter (Signed)
Contacted patient per [facility] current rescheduling guidelines.  Dr.Kale would like to reschedule appts for 3/31 and see in 1 month. Informed patient that scheduling will contact them. Patient verbalized understanding.  Schedule msg sent.

## 2018-12-16 ENCOUNTER — Inpatient Hospital Stay: Payer: No Typology Code available for payment source | Admitting: Hematology

## 2018-12-16 ENCOUNTER — Inpatient Hospital Stay: Payer: No Typology Code available for payment source

## 2019-01-15 ENCOUNTER — Inpatient Hospital Stay: Payer: No Typology Code available for payment source | Admitting: Hematology

## 2019-01-15 ENCOUNTER — Other Ambulatory Visit: Payer: Self-pay

## 2019-01-15 ENCOUNTER — Inpatient Hospital Stay: Payer: No Typology Code available for payment source | Attending: Hematology

## 2019-01-15 DIAGNOSIS — D709 Neutropenia, unspecified: Secondary | ICD-10-CM

## 2019-01-15 LAB — CBC WITH DIFFERENTIAL/PLATELET
Abs Immature Granulocytes: 0 10*3/uL (ref 0.00–0.07)
Basophils Absolute: 0 10*3/uL (ref 0.0–0.1)
Basophils Relative: 1 %
Eosinophils Absolute: 0 10*3/uL (ref 0.0–0.5)
Eosinophils Relative: 1 %
HCT: 37.6 % (ref 36.0–46.0)
Hemoglobin: 12.3 g/dL (ref 12.0–15.0)
Immature Granulocytes: 0 %
Lymphocytes Relative: 36 %
Lymphs Abs: 0.7 10*3/uL (ref 0.7–4.0)
MCH: 27.3 pg (ref 26.0–34.0)
MCHC: 32.7 g/dL (ref 30.0–36.0)
MCV: 83.4 fL (ref 80.0–100.0)
Monocytes Absolute: 0.3 10*3/uL (ref 0.1–1.0)
Monocytes Relative: 14 %
Neutro Abs: 0.9 10*3/uL — ABNORMAL LOW (ref 1.7–7.7)
Neutrophils Relative %: 48 %
Platelets: 232 10*3/uL (ref 150–400)
RBC: 4.51 MIL/uL (ref 3.87–5.11)
RDW: 12.6 % (ref 11.5–15.5)
WBC: 1.8 10*3/uL — ABNORMAL LOW (ref 4.0–10.5)
nRBC: 0 % (ref 0.0–0.2)

## 2019-01-15 LAB — CMP (CANCER CENTER ONLY)
ALT: 21 U/L (ref 0–44)
AST: 22 U/L (ref 15–41)
Albumin: 3.9 g/dL (ref 3.5–5.0)
Alkaline Phosphatase: 62 U/L (ref 38–126)
Anion gap: 7 (ref 5–15)
BUN: 12 mg/dL (ref 8–23)
CO2: 28 mmol/L (ref 22–32)
Calcium: 9.7 mg/dL (ref 8.9–10.3)
Chloride: 107 mmol/L (ref 98–111)
Creatinine: 1.03 mg/dL — ABNORMAL HIGH (ref 0.44–1.00)
GFR, Est AFR Am: >60 mL/min (ref >60)
GFR, Estimated: 57 mL/min — ABNORMAL LOW (ref >60)
Glucose, Bld: 89 mg/dL (ref 70–99)
Potassium: 4 mmol/L (ref 3.5–5.1)
Sodium: 142 mmol/L (ref 135–145)
Total Bilirubin: 0.4 mg/dL (ref 0.3–1.2)
Total Protein: 6.4 g/dL — ABNORMAL LOW (ref 6.5–8.1)

## 2019-01-16 ENCOUNTER — Telehealth: Payer: Self-pay | Admitting: Hematology

## 2019-01-16 NOTE — Telephone Encounter (Signed)
No los per 4/30. 

## 2019-01-19 NOTE — Progress Notes (Signed)
This encounter was created in error - please disregard.

## 2019-02-03 ENCOUNTER — Other Ambulatory Visit: Payer: Self-pay | Admitting: Internal Medicine

## 2019-02-10 ENCOUNTER — Ambulatory Visit: Payer: No Typology Code available for payment source

## 2019-02-23 ENCOUNTER — Ambulatory Visit (INDEPENDENT_AMBULATORY_CARE_PROVIDER_SITE_OTHER): Payer: HRSA Program | Admitting: Internal Medicine

## 2019-02-23 ENCOUNTER — Telehealth: Payer: Self-pay | Admitting: Internal Medicine

## 2019-02-23 ENCOUNTER — Encounter: Payer: Self-pay | Admitting: Internal Medicine

## 2019-02-23 ENCOUNTER — Telehealth: Payer: Self-pay | Admitting: General Practice

## 2019-02-23 ENCOUNTER — Other Ambulatory Visit: Payer: Self-pay

## 2019-02-23 DIAGNOSIS — Z20822 Contact with and (suspected) exposure to covid-19: Secondary | ICD-10-CM

## 2019-02-23 DIAGNOSIS — Z862 Personal history of diseases of the blood and blood-forming organs and certain disorders involving the immune mechanism: Secondary | ICD-10-CM | POA: Diagnosis not present

## 2019-02-23 DIAGNOSIS — J45909 Unspecified asthma, uncomplicated: Secondary | ICD-10-CM | POA: Diagnosis not present

## 2019-02-23 DIAGNOSIS — Z79899 Other long term (current) drug therapy: Secondary | ICD-10-CM

## 2019-02-23 DIAGNOSIS — J069 Acute upper respiratory infection, unspecified: Secondary | ICD-10-CM

## 2019-02-23 DIAGNOSIS — U071 COVID-19: Secondary | ICD-10-CM | POA: Diagnosis not present

## 2019-02-23 NOTE — Progress Notes (Signed)
   This is a telephone encounter between [employer] and Andree Elk Charlie Seda on 02/23/2019 for URI symptoms. The visit was conducted with the patient located at home and Corinne Ports at Home. The patient's identity was confirmed using their DOB and current address. The patient has consented to being evaluated through a telephone encounter and understands the associated risks/benefits. I personally spent 13 minutes on medical discussion.   HPI:   [name] is a 63 y.o. female with the medical conditions listed below. She called the internal medicine clinic for URI symptoms. She states her symptoms started three days ago with a scratchy, irritated throat, rhinorrhea, and a dry cough. These symptoms have improved with robitussin and aleve, but she continues to have productive cough (beige/green sputum), intermittent headache, loss of appetite, loss of taste, diarrhea (brown and watery without hematochezia/melena), and generalized weakness. Her highest fever was 99.1 yesterday. She endorses mild dyspnea with exertion and when talking. She has a history of asthma and has been using her Advair inhaler everyday. She states her breathing feels similar to when she doesn't use her inhaler. She hasn't used her albuterol in a while. She endorses good PO intake without nausea or vomiting. She denies known sick contacts or Covid exposures. She has been visiting her mother and going to the grocery store. She wears a mask whenever she leaves the house. She lives alone. Please see problem based charting for assessment and plan.   Past Medical History:  Diagnosis Date  . Allergy    seasonal, PCN, antidepressants, bentyl  . Arthritis   . Asthma   . Carpal tunnel syndrome on both sides    right worse than left 2008  . COPD (chronic obstructive pulmonary disease) ([place])   . GERD (gastroesophageal reflux disease)   . Hyperlipidemia    LDL 108 JULY 2013  . IBS (irritable bowel syndrome)   . Multiple nevi  03/01/2015  . Pneumonia   . Positive TB test    From [facility] 2009 note : Recent F/u at [facility]. CXRAy negative.  Positive TST in 2007 (45mm).  Unable to tolerate INH due to hepatotoxicity    Review of Systems:   Pertinent positives mentioned in HPI. Remainder of all ROS negative.   Assessment & Plan:   Patient discussed with Dr. Lynnae January

## 2019-02-23 NOTE — Telephone Encounter (Signed)
-----   Message from Corinne Ports, MD sent at 02/23/2019  2:28 PM EDT ----- Regarding: Covid testing Hello,  I am referring Aimee Davis for Covid-19 testing based on three days of sore throat, fatigue, loss of appetite and taste, diarrhea, and dyspnea. She has been afebrile. She has asthma.   MRN: [id number] DOB: 03/12/1956 Insurance: None  Thanks, Dr. Annie Paras

## 2019-02-23 NOTE — Telephone Encounter (Signed)
Agree. thanks

## 2019-02-23 NOTE — Telephone Encounter (Signed)
Pt has been scheduled covid testing. Spoke with pt directly  Pt was referred by: Dorrell, Andree Elk, MD

## 2019-02-23 NOTE — Telephone Encounter (Addendum)
Call from pt - stated starting Friday, she has been c/o sore throat, diarrhea, body aches , feeling weak and loss of appetite. She has been taking Robitussin. Feeling a little better today - eating better, body aches and diarrhea not as bad. Temp yesterday was 99.1      NURSING TRIAGE NOTE FOR RESPIRATORY SYMPTOMS  Do you have a fever? Temp yesterday was 99.1.  Do you have a cough? Yes - productive; yellow in color; sometimes a dry cough. Do you have shortness of breath more than normal? Yes - moderate despite using her inhaler. Do you have chest pain?No Are you able to eat and drink normally? No but getting better. Have you seen a physician for these symptoms? No   I informed patient to expect a phone call from a physician soon and I sent request to front desk to schedule a virtual office appt for patient and arrive the patient.  Telehealth visit scheduled for today @ 1415 PM; pt aware.

## 2019-02-23 NOTE — Assessment & Plan Note (Signed)
HPI: [name] is a 63 y.o. female with the medical conditions listed below. She called the internal medicine clinic for URI symptoms. She states her symptoms started three days ago with a scratchy, irritated throat, rhinorrhea, and a dry cough. These symptoms have improved with robitussin and aleve, but she continues to have productive cough (beige/green sputum), intermittent headache, loss of appetite, loss of taste, diarrhea (brown and watery without hematochezia/melena), and generalized weakness. Her highest fever was 99.1 yesterday. She endorses mild dyspnea with exertion and when talking. She has a history of asthma and has been using her Advair inhaler everyday. She states her breathing feels similar to when she doesn't use her inhaler. She hasn't used her albuterol in a while. She endorses good PO intake without nausea or vomiting. She denies known sick contacts or Covid exposures. She has been visiting her mother and going to the grocery store. She wears a mask whenever she leaves the house. She lives alone.  Assessment: Patient would benefit from Covid-19 testing based on her symptoms and history of neutropenia. She sounded stable over the phone and was able to speak in complete sentences. I advised her to use her albuterol inhaler prn for dyspnea and to continue supportive treatment with robitussin and tylenol as needed. She understands that she should self-quarantine at home until her test results.   Plan - Test for Covid-19

## 2019-02-23 NOTE — Addendum Note (Signed)
Addended by: Dimple Nanas on: 02/23/2019 03:25 PM   Modules accepted: Orders

## 2019-02-24 ENCOUNTER — Encounter (HOSPITAL_COMMUNITY): Payer: Self-pay | Admitting: Emergency Medicine

## 2019-02-24 ENCOUNTER — Other Ambulatory Visit: Payer: Self-pay

## 2019-02-24 ENCOUNTER — Emergency Department (HOSPITAL_COMMUNITY)
Admission: EM | Admit: 2019-02-24 | Discharge: 2019-02-25 | Disposition: A | Discharge status: Home / Self Care | Payer: Self-pay | Attending: Emergency Medicine | Admitting: Emergency Medicine

## 2019-02-24 ENCOUNTER — Emergency Department (HOSPITAL_COMMUNITY): Payer: Self-pay

## 2019-02-24 ENCOUNTER — Other Ambulatory Visit: Payer: No Typology Code available for payment source

## 2019-02-24 DIAGNOSIS — R059 Cough, unspecified: Secondary | ICD-10-CM

## 2019-02-24 DIAGNOSIS — Z20822 Contact with and (suspected) exposure to covid-19: Secondary | ICD-10-CM

## 2019-02-24 DIAGNOSIS — R05 Cough: Secondary | ICD-10-CM | POA: Insufficient documentation

## 2019-02-24 DIAGNOSIS — J449 Chronic obstructive pulmonary disease, unspecified: Secondary | ICD-10-CM | POA: Insufficient documentation

## 2019-02-24 DIAGNOSIS — Z87891 Personal history of nicotine dependence: Secondary | ICD-10-CM | POA: Insufficient documentation

## 2019-02-24 DIAGNOSIS — Z79899 Other long term (current) drug therapy: Secondary | ICD-10-CM | POA: Insufficient documentation

## 2019-02-24 MED ORDER — SODIUM CHLORIDE 0.9 % IV BOLUS
500.0000 mL | Freq: Once | INTRAVENOUS | Status: AC
  Administered 2019-02-24: 500 mL via INTRAVENOUS

## 2019-02-24 MED ORDER — ALBUTEROL SULFATE HFA 108 (90 BASE) MCG/ACT IN AERS
8.0000 | INHALATION_SPRAY | Freq: Once | RESPIRATORY_TRACT | Status: AC
  Administered 2019-02-24: 8 via RESPIRATORY_TRACT
  Filled 2019-02-24: qty 6.7

## 2019-02-24 MED ORDER — PREDNISONE 20 MG PO TABS
60.0000 mg | ORAL_TABLET | Freq: Once | ORAL | Status: AC
  Administered 2019-02-24: 23:00:00 60 mg via ORAL
  Filled 2019-02-24: qty 3

## 2019-02-24 NOTE — ED Provider Notes (Signed)
[facility] DEPT Provider Note   CSN: [id number] Arrival date & time: 02/24/19  2044  History   Chief Complaint Chief Complaint  Patient presents with   Cough   HPI Aimee Davis is a 63 y.o. female with past medical history significant for COPD, hyperlipidemia, seasonal allergies who presents for evaluation of cough and generalized weakness.  Patient states she has had productive cough yellow sputum x4 days.  Denies any hemoptysis.  States she was tested for COVID today however has not received the results.  She has also used her home albuterol and Advair inhaler which she uses for her COPD.  She last used this this morning.  States she does feel slightly "wheezy" however she is concerned that she has pneumonia.  Denies fever, chills, nausea, vomiting, chest pain, shortness of breath, abdominal pain, diarrhea, dysuria, swelling to her lower extremities, redness or warmth to her extremities, history of PE or DVT.  Denies any additional aggravating or alleviating factors. No unilateral weakness, slurred speech, facial asymmetry, melena or hematochezia.  History obtained from patient.  No interpreter was used.    HPI  Past Medical History:  Diagnosis Date   Allergy    seasonal, PCN, antidepressants, bentyl   Arthritis    Asthma    Carpal tunnel syndrome on both sides    right worse than left 2008   COPD (chronic obstructive pulmonary disease) (HCC)    GERD (gastroesophageal reflux disease)    Hyperlipidemia    LDL 108 JULY 2013   IBS (irritable bowel syndrome)    Multiple nevi 03/01/2015   Pneumonia    Positive TB test    From [facility] 2009 note : Recent F/u at [facility]. CXRAy negative.  Positive TST in 2007 (54mm).  Unable to tolerate INH due to hepatotoxicity    Patient Active Problem List   Diagnosis Date Noted   Left foot pain 11/20/2018   History of colon polyps 06/24/2018   Eczema 05/02/2018   Constitutional neutropenia ([place])  02/21/2018   Leukopenia 02/19/2018   Chronic abdominal pain 07/04/2017   Seasonal allergies 03/29/2017   Viral upper respiratory tract infection 06/18/2016   Dizziness 10/27/2013   Cataract 06/11/2013   Healthcare maintenance 06/11/2013   Positive TB test    Primary insomnia 06/05/2012   Weight loss 01/28/2009   CARPAL TUNNEL SYNDROME, BILATERAL 03/29/2008   Asthma 01/31/2007   GERD (gastroesophageal reflux disease) 11/14/2006   Irritable bowel syndrome 11/14/2006    Past Surgical History:  Procedure Laterality Date   BREAST CYST EXCISION Right 2011   BREAST EXCISIONAL BIOPSY Left    benign   BREAST LUMPECTOMY Right 1990's   INDUCED ABORTION  2005   VAGINAL HYSTERECTOMY  1983   partial; for abnormal bleeding     OB History   No obstetric history on file.      Home Medications    Prior to Admission medications   Medication Sig Start Date End Date Taking? Authorizing Provider  Artificial Tear Ointment (DRY EYES OP) Apply 1 drop to eye daily as needed (dry eyes).    [provider]  Ascorbic Acid (VITAMIN C) 100 MG tablet Take 50 mg by mouth daily.    [provider]  cetirizine (ZYRTEC) 10 MG tablet Take 10 mg by mouth daily.    [provider]  dicyclomine (BENTYL) 10 MG capsule Take 1 capsule (10 mg total) by mouth every 8 (eight) hours as needed for spasms. 11/20/18   [place] Cellar  P, MD  Fluticasone-Salmeterol (ADVAIR DISKUS) 250-50 MCG/DOSE AEPB Inhale 1 puff into the lungs 2 (two) times daily. 01/02/18 01/02/19  Ledell Noss, MD  linaclotide [facility]) 145 MCG CAPS capsule Take 1 capsule (145 mcg total) by mouth daily before breakfast. Take for 3 days.  If no better, may increase to 2 tablets daily 11/20/18   Armbruster, Carlota Raspberry, MD  linaclotide [facility]) 145 MCG CAPS capsule Take 1 capsule (145 mcg total) by mouth daily before breakfast. 11/26/18   Armbruster, Carlota Raspberry, MD  loratadine (CLARITIN) 10 MG tablet Take 10 mg by  mouth daily.    [provider]  Magnesium Hydroxide (MILK OF MAGNESIA PO) Take by mouth as needed.    [provider]  mometasone (NASONEX) 50 MCG/ACT nasal spray PLACE 2 SPRAYS IN EACH NOSTRIL ONCE DAILY AS NEEDED. 02/03/19   Ledell Noss, MD  Multiple Vitamin (MULTIVITAMIN) capsule Take 1 capsule by mouth daily.    [provider]  pantoprazole (PROTONIX) 40 MG tablet Take 1 tablet (40 mg total) by mouth daily. 06/24/18   Ledell Noss, MD  Probiotic Product (PROBIOTIC DAILY PO) Take by mouth.    [provider]  PROVENTIL HFA 108 (90 Base) MCG/ACT inhaler INHALE 2 PUFFS INTO THE LUNGS EVERY 6 HOURS AS NEEDED FOR WHEEZING. 12/04/18   Sid Falcon, MD  ROZEREM 8 MG tablet TAKE 1 TABLET (8MG  TOTAL) BY MOUTH AT BEDTIME AS NEEDED FOR SLEEP. 12/16/18   Ledell Noss, MD  zaleplon (SONATA) 5 MG capsule Take 1 capsule (5 mg total) by mouth at bedtime as needed for sleep. 08/08/18   Asencion Noble, MD    Family History Family History  Problem Relation Age of Onset   Hypertension Mother    Hyperlipidemia Mother    Heart disease Mother    Diabetes Mother    Kidney disease Mother    Stroke Father    Diabetes Father    Heart disease Father    Hyperlipidemia Father    Hypertension Father    Kidney disease Father    Cancer Sister        breast   Mental illness Sister    Diabetes Sister    Kidney disease Sister    Hypertension Sister    Mental illness Brother    Asthma Son    Hypertension Maternal Aunt    Hypertension Maternal Uncle    Hypertension Paternal Aunt    Kidney disease Paternal Aunt    Hypertension Paternal Uncle    Kidney disease Paternal Uncle     Social History Social History   Tobacco Use   Smoking status: Former Smoker    Packs/day: 0.33    Years: 43.00    Pack years: 14.19    Types: Cigarettes    Last attempt to quit: 01/06/2013    Years since quitting: 6.1   Smokeless tobacco: Never Used  Substance Use  Topics   Alcohol use: No    Alcohol/week: 0.0 standard drinks   Drug use: Yes    Types: Cocaine, Marijuana, Benzodiazepines     Allergies   Penicillins   Review of Systems Review of Systems  Constitutional: Negative.   HENT: Negative.   Eyes: Negative.   Respiratory: Positive for cough and wheezing. Negative for apnea, choking, chest tightness, shortness of breath and stridor.   Gastrointestinal: Negative.   Genitourinary: Negative.   Musculoskeletal: Negative.   Skin: Negative.   Neurological: Positive for weakness (Generalized). Negative for dizziness, tremors, seizures, syncope, facial  asymmetry, speech difficulty, light-headedness, numbness and headaches.  All other systems reviewed and are negative.    Physical Exam Updated Vital Signs BP 104/75    Pulse 86    Temp 98.7 F (37.1 C) (Oral)    Resp 18    Ht 5' 3.5" (1.613 m)    Wt 65.8 kg    SpO2 96%    BMI 25.28 kg/m   Physical Exam Vitals signs and nursing note reviewed.  Constitutional:      General: She is not in acute distress.    Appearance: She is not ill-appearing, toxic-appearing or diaphoretic.     Comments: Talking on phone on initial evaluation.  No acute distress noted.  HENT:     Head: Normocephalic and atraumatic.     Jaw: There is normal jaw occlusion.     Right Ear: Tympanic membrane, ear canal and external ear normal. There is no impacted cerumen. No hemotympanum. Tympanic membrane is not injected, scarred, perforated, erythematous, retracted or bulging.     Left Ear: Tympanic membrane, ear canal and external ear normal. There is no impacted cerumen. No hemotympanum. Tympanic membrane is not injected, scarred, perforated, erythematous, retracted or bulging.     Ears:     Comments: No Mastoid tenderness.    Nose:     Comments: Clear rhinorrhea and congestion to bilateral nares.  No sinus tenderness.    Mouth/Throat:     Comments: Posterior oropharynx clear.  Mucous membranes moist.  Tonsils  without erythema or exudate.  Uvula midline without deviation.  No evidence of PTA or RPA.  No drooling, dysphasia or trismus.  Phonation normal. Neck:     Trachea: Trachea and phonation normal.     Meningeal: Brudzinski's sign and Kernig's sign absent.     Comments: No Neck stiffness or neck rigidity.  No meningismus.  No cervical lymphadenopathy. Cardiovascular:     Comments: No murmurs rubs or gallops. Pulmonary:     Comments: Auscultation bilaterally without rhonchi or rales.  Mild diffuse wheezing bilaterally worse to upper lobes. No accessory muscle usage.  Able speak in full sentences. Abdominal:     Comments: Soft, nontender without rebound or guarding.  No CVA tenderness.  Musculoskeletal: Normal range of motion.        General: No swelling, tenderness or deformity.     Right lower leg: No edema.     Left lower leg: No edema.     Comments: Moves all 4 extremities without difficulty.  Lower extremities without edema, erythema or warmth.  Homans sign negative.  Skin:    Comments: Brisk capillary refill.  No rashes or lesions.  Neurological:     Mental Status: She is alert.     Comments: Mental Status:  Alert, oriented, thought content appropriate. Speech fluent without evidence of aphasia. Able to follow 2 step commands without difficulty.  Cranial Nerves:  II:  Peripheral visual fields grossly normal, pupils equal, round, reactive to light III,IV, VI: ptosis not present, extra-ocular motions intact bilaterally  V,VII: smile symmetric, facial light touch sensation equal VIII: hearing grossly normal bilaterally  IX,X: midline uvula rise  XI: bilateral shoulder shrug equal and strong XII: midline tongue extension  Motor:  5/5 in upper and lower extremities bilaterally including strong and equal grip strength and dorsiflexion/plantar flexion Sensory: Pinprick and light touch normal in all extremities.  Deep Tendon Reflexes: 2+ and symmetric  Cerebellar: normal finger-to-nose  with bilateral upper extremities Gait: normal gait and balance CV: distal pulses palpable  throughout      ED Treatments / Results  Labs (all labs ordered are listed, but only abnormal results are displayed) Labs Reviewed  CBC WITH DIFFERENTIAL/PLATELET - Abnormal; Notable for the following components:      Result Value   WBC 1.9 (*)    Neutro Abs 1.1 (*)    Lymphs Abs 0.5 (*)    All other components within normal limits  COMPREHENSIVE METABOLIC PANEL - Abnormal; Notable for the following components:   Glucose, Bld 112 (*)    Total Bilirubin 0.2 (*)    All other components within normal limits  CULTURE, BLOOD (ROUTINE X 2)  CULTURE, BLOOD (ROUTINE X 2)  TROPONIN I    EKG EKG Interpretation  Date/Time:  Tuesday February 24 2019 23:33:30 EDT Ventricular Rate:  66 PR Interval:    QRS Duration: 112 QT Interval:  372 QTC Calculation: 390 R Axis:   30 Text Interpretation:  Sinus rhythm Probable left atrial enlargement Incomplete right bundle branch block Abnormal T, consider ischemia, lateral leads nonspecific dynamic anterior t wave compared to old tracings. Confirmed by Charlesetta Shanks [phone]) on 02/24/2019 11:43:48 PM   Radiology Dg Chest 2 View  Result Date: 02/24/2019 CLINICAL DATA:  Initial evaluation for acute cough, chest congestion. EXAM: CHEST - 2 VIEW COMPARISON:  Prior radiograph from 12/31/2017. FINDINGS: Cardiac and mediastinal silhouettes are stable in size and contour, and remain within normal limits. Lungs hyperinflated with attenuation of the pulmonary markings, compatible with emphysema. No focal infiltrates. No edema or effusion. No pneumothorax. No acute osseous finding. IMPRESSION: 1. Hyperinflation with underlying COPD. 2. No other active cardiopulmonary disease. Electronically Signed   By: Jeannine Boga M.D.   On: 02/24/2019 22:06    Procedures Procedures (including critical care time)  Medications Ordered in ED Medications  sodium chloride 0.9 %  bolus 500 mL (0 mLs Intravenous Stopped 02/25/19 0022)  predniSONE (DELTASONE) tablet 60 mg (60 mg Oral Given 02/24/19 2315)  albuterol (VENTOLIN HFA) 108 (90 Base) MCG/ACT inhaler 8 puff (8 puffs Inhalation Given 02/24/19 2322)     Initial Impression / Assessment and Plan / ED Course  I have reviewed the triage vital signs and the nursing notes.  Pertinent labs & imaging results that were available during my care of the patient were reviewed by me and considered in my medical decision making (see chart for details).  28 old female appears otherwise well presents for evaluation of cough.  Afebrile, nonseptic, non-ill-appearing.  Cough productive of yellow sputum.  She was tested for COVID outpatient today.  She is concerned that she has pneumonia and is requesting a chest x-ray.  States she has felt "wheezy" over the last few days.  She did use her home albuterol treatment Advair this morning without resolve symptoms.  She has no chest pain or shortness of breath.  No history of PE, DVT, lower extremities without tenderness, edema, erythema or warmth.  She has no rashes or lesions.  Heart is clear.  Lungs with mild diffuse wheezing, worse in upper lobes.  She has no tachycardia, tachypnea or hypoxia.  She has had generalized weakness without unilateral weakness, slurred speech, dizziness or facial asymmetry.  She has a nonfocal neurologic exam without neurologic deficits.  Low suspicion for CVA.  Denies melena or hematochezia.  Abdomen soft, nontender without rebound or guarding.  Will obtain baseline labs to r/o anemia as cause of weakness, provide fluids, EKG, albuterol treatment and steroids and reassess. Given tested for COVID today do  not feel need to repeat at this time. DG chest negative for infiltrates, consistent with chronic COPD changes.  Patient is without hypoxia, tachycardia tachypnea.  I have low suspicion for PE, DVT, ACS, pericarditis, myocarditis or dissection. Favor COVID vs COPD  exacerbation at this time.  Labs and imaging personally reviewed.  Chest x-ray without infiltrates, cardiomegaly, or edema.  CBC leukopenia at 1.9, metabolic panel with mild hyperkalemia at 112.  Troponin negative.  KG not pulling and to computer.  Nonspecific EKG changes.  I went to reevaluate patient nursing has informed me she left AGAINST MEDICAL ADVICE. I was unable to discuss results with patient as she left before I could perform the reassessment.  NAZIYAH TIESZEN was evaluated in Emergency Department on 02/25/2019 for the symptoms described in the history of present illness. She was evaluated in the context of the global COVID-19 pandemic, which necessitated consideration that the patient might be at risk for infection with the SARS-CoV-2 virus that causes COVID-19. Institutional protocols and algorithms that pertain to the evaluation of patients at risk for COVID-19 are in a state of rapid change based on information released by regulatory bodies including the CDC and federal and state organizations. These policies and algorithms were followed during the patient's care in the ED.     Final Clinical Impressions(s) / ED Diagnoses   Final diagnoses:  Cough    ED Discharge Orders    None       Leny Morozov A, PA-C 02/25/19 0053    Charlesetta Shanks, MD 02/28/19 1443

## 2019-02-24 NOTE — ED Triage Notes (Signed)
Patient c/o cough and chest congestion worsening today. States symptoms since Friday. Reports COVID tested today. Speaking in full sentences without difficulty.

## 2019-02-25 ENCOUNTER — Telehealth: Payer: Self-pay | Admitting: Internal Medicine

## 2019-02-25 LAB — CBC WITH DIFFERENTIAL/PLATELET
Abs Immature Granulocytes: 0.01 10*3/uL (ref 0.00–0.07)
Basophils Absolute: 0 10*3/uL (ref 0.0–0.1)
Basophils Relative: 0 %
Eosinophils Absolute: 0 10*3/uL (ref 0.0–0.5)
Eosinophils Relative: 0 %
HCT: 37.5 % (ref 36.0–46.0)
Hemoglobin: 12.3 g/dL (ref 12.0–15.0)
Immature Granulocytes: 1 %
Lymphocytes Relative: 26 %
Lymphs Abs: 0.5 10*3/uL — ABNORMAL LOW (ref 0.7–4.0)
MCH: 27.8 pg (ref 26.0–34.0)
MCHC: 32.8 g/dL (ref 30.0–36.0)
MCV: 84.7 fL (ref 80.0–100.0)
Monocytes Absolute: 0.3 10*3/uL (ref 0.1–1.0)
Monocytes Relative: 14 %
Neutro Abs: 1.1 10*3/uL — ABNORMAL LOW (ref 1.7–7.7)
Neutrophils Relative %: 59 %
Platelets: 158 10*3/uL (ref 150–400)
RBC: 4.43 MIL/uL (ref 3.87–5.11)
RDW: 12.8 % (ref 11.5–15.5)
WBC: 1.9 10*3/uL — ABNORMAL LOW (ref 4.0–10.5)
nRBC: 0 % (ref 0.0–0.2)

## 2019-02-25 LAB — COMPREHENSIVE METABOLIC PANEL
ALT: 22 U/L (ref 0–44)
AST: 27 U/L (ref 15–41)
Albumin: 4.1 g/dL (ref 3.5–5.0)
Alkaline Phosphatase: 55 U/L (ref 38–126)
Anion gap: 8 (ref 5–15)
BUN: 12 mg/dL (ref 8–23)
CO2: 24 mmol/L (ref 22–32)
Calcium: 9.1 mg/dL (ref 8.9–10.3)
Chloride: 104 mmol/L (ref 98–111)
Creatinine, Ser: 0.81 mg/dL (ref 0.44–1.00)
GFR calc Af Amer: >60 mL/min (ref >60)
GFR calc non Af Amer: >60 mL/min (ref >60)
Glucose, Bld: 112 mg/dL — ABNORMAL HIGH (ref 70–99)
Potassium: 3.8 mmol/L (ref 3.5–5.1)
Sodium: 136 mmol/L (ref 135–145)
Total Bilirubin: 0.2 mg/dL — ABNORMAL LOW (ref 0.3–1.2)
Total Protein: 6.7 g/dL (ref 6.5–8.1)

## 2019-02-25 LAB — TROPONIN I: Troponin I: <0.03 ng/mL (ref <0.03)

## 2019-02-25 NOTE — Telephone Encounter (Signed)
Returned call to patient.  She states she had COVID testing yesterday, but developed increased coughing w/ increased SOB last night and presented to the ER.  Pt states she "had several test performed in the ER, but after 2 hours of monitoring she felt that was enough and left".  Per ER encounter, pt left AMA before MD could discuss results with her.  Pt is requesting a call from Dr. Hetty Ely about lab results from ER visit. Will forward to PCP. Thank you, SChaplin, RN,BSN

## 2019-02-25 NOTE — ED Notes (Signed)
Pt questioned about reason for discharge she stated " I just been here to long"

## 2019-02-25 NOTE — ED Notes (Addendum)
Pt refused to allow me to hook her back up to the monitor Pt stated, "I don't need my vitals checked. I'm fine. I want everything off and this (IV) out." PA and RN Levada Dy informed of same

## 2019-02-25 NOTE — Telephone Encounter (Signed)
Pt went to ER she would like a nurse to callback regarding her visit lastnight [phone]

## 2019-02-26 ENCOUNTER — Other Ambulatory Visit: Payer: Self-pay | Admitting: Internal Medicine

## 2019-02-26 NOTE — Telephone Encounter (Signed)
[facility] testing site calls this am to states pt's COVID test is +POSITIVE+ [facility].has made rn taking call aware that pt was in ED day of testing, she is [facility]. This info to confirm that [facility] will need to notify ED.

## 2019-02-26 NOTE — Telephone Encounter (Signed)
That k you for letting me know. I called and spoke with Aimee Davis regarding the the results of recent ED visit. She tested positive for COVID 19 on 6/9. She said that prior to the ED visit she was feeling some shortness of breath but has not had any since. I recommended a 2 week quarantine from the time of symptom onset. She has a friend that can bring her groceries and resources. I asked that she call EMS for any return of shortness o f breath or new symptoms of stroke or chest pain.

## 2019-02-27 NOTE — Progress Notes (Signed)
Internal Medicine Clinic Attending  Case discussed with Dr. Annie Paras at the time of the visit.  We reviewed the resident's history and exam and pertinent patient test results.  I agree with the assessment, diagnosis, and plan of care documented in the resident's note.  COVID +

## 2019-02-28 LAB — NOVEL CORONAVIRUS, NAA: SARS-CoV-2, NAA: DETECTED — AB

## 2019-03-02 ENCOUNTER — Encounter (HOSPITAL_COMMUNITY): Payer: Self-pay

## 2019-03-02 ENCOUNTER — Telehealth: Payer: Self-pay | Admitting: *Deleted

## 2019-03-02 ENCOUNTER — Emergency Department (HOSPITAL_COMMUNITY): Payer: HRSA Program

## 2019-03-02 ENCOUNTER — Other Ambulatory Visit: Payer: Self-pay

## 2019-03-02 ENCOUNTER — Inpatient Hospital Stay (HOSPITAL_COMMUNITY)
Admission: EM | Admit: 2019-03-02 | Discharge: 2019-03-05 | DRG: 178 | Disposition: A | Discharge status: Home Health | Payer: HRSA Program | Attending: Internal Medicine | Admitting: Internal Medicine

## 2019-03-02 DIAGNOSIS — Z803 Family history of malignant neoplasm of breast: Secondary | ICD-10-CM | POA: Diagnosis not present

## 2019-03-02 DIAGNOSIS — Z7951 Long term (current) use of inhaled steroids: Secondary | ICD-10-CM | POA: Diagnosis not present

## 2019-03-02 DIAGNOSIS — Z880 Allergy status to penicillin: Secondary | ICD-10-CM | POA: Diagnosis not present

## 2019-03-02 DIAGNOSIS — Z90710 Acquired absence of both cervix and uterus: Secondary | ICD-10-CM

## 2019-03-02 DIAGNOSIS — R531 Weakness: Secondary | ICD-10-CM

## 2019-03-02 DIAGNOSIS — J45901 Unspecified asthma with (acute) exacerbation: Secondary | ICD-10-CM | POA: Diagnosis not present

## 2019-03-02 DIAGNOSIS — M1990 Unspecified osteoarthritis, unspecified site: Secondary | ICD-10-CM | POA: Diagnosis present

## 2019-03-02 DIAGNOSIS — K219 Gastro-esophageal reflux disease without esophagitis: Secondary | ICD-10-CM | POA: Diagnosis present

## 2019-03-02 DIAGNOSIS — Z8701 Personal history of pneumonia (recurrent): Secondary | ICD-10-CM

## 2019-03-02 DIAGNOSIS — U071 COVID-19: Secondary | ICD-10-CM | POA: Diagnosis not present

## 2019-03-02 DIAGNOSIS — Z888 Allergy status to other drugs, medicaments and biological substances status: Secondary | ICD-10-CM

## 2019-03-02 DIAGNOSIS — Z87891 Personal history of nicotine dependence: Secondary | ICD-10-CM | POA: Diagnosis not present

## 2019-03-02 DIAGNOSIS — Z79899 Other long term (current) drug therapy: Secondary | ICD-10-CM

## 2019-03-02 DIAGNOSIS — J449 Chronic obstructive pulmonary disease, unspecified: Secondary | ICD-10-CM | POA: Diagnosis not present

## 2019-03-02 DIAGNOSIS — Z825 Family history of asthma and other chronic lower respiratory diseases: Secondary | ICD-10-CM | POA: Diagnosis not present

## 2019-03-02 DIAGNOSIS — K589 Irritable bowel syndrome without diarrhea: Secondary | ICD-10-CM | POA: Diagnosis not present

## 2019-03-02 DIAGNOSIS — Z66 Do not resuscitate: Secondary | ICD-10-CM | POA: Diagnosis present

## 2019-03-02 DIAGNOSIS — R0602 Shortness of breath: Secondary | ICD-10-CM

## 2019-03-02 DIAGNOSIS — J45909 Unspecified asthma, uncomplicated: Secondary | ICD-10-CM | POA: Diagnosis present

## 2019-03-02 LAB — COMPREHENSIVE METABOLIC PANEL
ALT: 27 U/L (ref 0–44)
AST: 38 U/L (ref 15–41)
Albumin: 3.6 g/dL (ref 3.5–5.0)
Alkaline Phosphatase: 52 U/L (ref 38–126)
Anion gap: 10 (ref 5–15)
BUN: 14 mg/dL (ref 8–23)
CO2: 23 mmol/L (ref 22–32)
Calcium: 8.9 mg/dL (ref 8.9–10.3)
Chloride: 103 mmol/L (ref 98–111)
Creatinine, Ser: 0.93 mg/dL (ref 0.44–1.00)
GFR calc Af Amer: >60 mL/min (ref >60)
GFR calc non Af Amer: >60 mL/min (ref >60)
Glucose, Bld: 106 mg/dL — ABNORMAL HIGH (ref 70–99)
Potassium: 3.7 mmol/L (ref 3.5–5.1)
Sodium: 136 mmol/L (ref 135–145)
Total Bilirubin: 0.6 mg/dL (ref 0.3–1.2)
Total Protein: 6.9 g/dL (ref 6.5–8.1)

## 2019-03-02 LAB — CBC WITH DIFFERENTIAL/PLATELET
Abs Immature Granulocytes: 0.02 10*3/uL (ref 0.00–0.07)
Basophils Absolute: 0 10*3/uL (ref 0.0–0.1)
Basophils Relative: 0 %
Eosinophils Absolute: 0 10*3/uL (ref 0.0–0.5)
Eosinophils Relative: 0 %
HCT: 35.7 % — ABNORMAL LOW (ref 36.0–46.0)
Hemoglobin: 11.7 g/dL — ABNORMAL LOW (ref 12.0–15.0)
Immature Granulocytes: 1 %
Lymphocytes Relative: 14 %
Lymphs Abs: 0.4 10*3/uL — ABNORMAL LOW (ref 0.7–4.0)
MCH: 27.1 pg (ref 26.0–34.0)
MCHC: 32.8 g/dL (ref 30.0–36.0)
MCV: 82.6 fL (ref 80.0–100.0)
Monocytes Absolute: 0.3 10*3/uL (ref 0.1–1.0)
Monocytes Relative: 11 %
Neutro Abs: 2.2 10*3/uL (ref 1.7–7.7)
Neutrophils Relative %: 74 %
Platelets: 221 10*3/uL (ref 150–400)
RBC: 4.32 MIL/uL (ref 3.87–5.11)
RDW: 11.9 % (ref 11.5–15.5)
WBC: 2.9 10*3/uL — ABNORMAL LOW (ref 4.0–10.5)
nRBC: 0 % (ref 0.0–0.2)

## 2019-03-02 LAB — C-REACTIVE PROTEIN: CRP: 15.5 mg/dL — ABNORMAL HIGH (ref <1.0)

## 2019-03-02 LAB — FERRITIN: Ferritin: 270 ng/mL (ref 11–307)

## 2019-03-02 MED ORDER — ALBUTEROL SULFATE HFA 108 (90 BASE) MCG/ACT IN AERS
1.0000 | INHALATION_SPRAY | RESPIRATORY_TRACT | Status: DC | PRN
  Administered 2019-03-02 – 2019-03-04 (×5): 2 via RESPIRATORY_TRACT
  Filled 2019-03-02: qty 6.7

## 2019-03-02 MED ORDER — ENSURE ENLIVE PO LIQD
237.0000 mL | Freq: Two times a day (BID) | ORAL | Status: DC
  Administered 2019-03-03: 237 mL via ORAL

## 2019-03-02 MED ORDER — SODIUM CHLORIDE 0.9 % IV BOLUS
1000.0000 mL | Freq: Once | INTRAVENOUS | Status: AC
  Administered 2019-03-02: 1000 mL via INTRAVENOUS

## 2019-03-02 MED ORDER — METHYLPREDNISOLONE SODIUM SUCC 125 MG IJ SOLR
60.0000 mg | Freq: Two times a day (BID) | INTRAMUSCULAR | Status: DC
  Administered 2019-03-02 – 2019-03-04 (×4): 60 mg via INTRAVENOUS
  Filled 2019-03-02 (×4): qty 2

## 2019-03-02 MED ORDER — LINACLOTIDE 145 MCG PO CAPS
145.0000 ug | ORAL_CAPSULE | Freq: Every day | ORAL | Status: DC
  Administered 2019-03-04 – 2019-03-05 (×2): 145 ug via ORAL
  Filled 2019-03-02 (×4): qty 1

## 2019-03-02 MED ORDER — DICYCLOMINE HCL 10 MG PO CAPS
10.0000 mg | ORAL_CAPSULE | Freq: Three times a day (TID) | ORAL | Status: DC | PRN
  Filled 2019-03-02 (×2): qty 1

## 2019-03-02 MED ORDER — LORATADINE 10 MG PO TABS
10.0000 mg | ORAL_TABLET | Freq: Every day | ORAL | Status: DC
  Administered 2019-03-03 – 2019-03-05 (×3): 10 mg via ORAL
  Filled 2019-03-02 (×3): qty 1

## 2019-03-02 MED ORDER — RAMELTEON 8 MG PO TABS
8.0000 mg | ORAL_TABLET | Freq: Every evening | ORAL | Status: DC | PRN
  Administered 2019-03-03 – 2019-03-04 (×2): 8 mg via ORAL
  Filled 2019-03-02 (×4): qty 1

## 2019-03-02 MED ORDER — ENOXAPARIN SODIUM 40 MG/0.4ML ~~LOC~~ SOLN
40.0000 mg | SUBCUTANEOUS | Status: DC
  Administered 2019-03-02 – 2019-03-04 (×3): 40 mg via SUBCUTANEOUS
  Filled 2019-03-02 (×3): qty 0.4

## 2019-03-02 MED ORDER — SODIUM CHLORIDE 0.9 % IV SOLN
INTRAVENOUS | Status: AC
  Administered 2019-03-02 – 2019-03-03 (×2): via INTRAVENOUS

## 2019-03-02 MED ORDER — PANTOPRAZOLE SODIUM 40 MG PO TBEC
40.0000 mg | DELAYED_RELEASE_TABLET | Freq: Every day | ORAL | Status: DC
  Administered 2019-03-03 – 2019-03-05 (×3): 40 mg via ORAL
  Filled 2019-03-02 (×3): qty 1

## 2019-03-02 NOTE — Telephone Encounter (Signed)
Have spoken with pt again, she will call 911 and will tell the 911 operator that she is COVID +. She was informed she may be taken straight to [facility] or possibly [facility], she states she had a bad experience at [facility] and will refuse [facility].Aimee Davis

## 2019-03-02 NOTE — Telephone Encounter (Signed)
Yes with her chest pain and SOB would probably be best for her to call EMS

## 2019-03-02 NOTE — Telephone Encounter (Signed)
Also on review she is COVID 19 positive, she should let EMS know that fact, ideally would be transported to the [facility] ED

## 2019-03-02 NOTE — Telephone Encounter (Signed)
Pt calls and states she is alone, having chest pain, short of breath constant, she is breathless after 2 words,  nausea, diarrhea, chills- oral temp 101.5, severely weak, cant do for herself. She is frightened. Call EMS?

## 2019-03-02 NOTE — ED Notes (Signed)
Patient had difficulty with attempt to wean from O2. While on room air her sats dropped to 93 and she complained of slight difficulty breathing and increased coughing. Patient now currently on 1L O2.

## 2019-03-02 NOTE — H&P (Signed)
History and Physical    Aimee Davis DOB: 08-Sep-1956 DOA: 03/02/2019  Referring MD/NP/PA: EDP PCP:: Internal medicine teaching service Patient coming from: Home  Chief Complaint: Shortness of breath and weakness  HPI: Aimee Davis is a 63 y.o. female with medical history significant of asthma, GERD, seasonal allergies, developed sore throat chills diarrhea and productive cough about a week ago, she called her PCPs office subsequently went through [facility] [place] to have cover testing which resulted positive on 6/9, she was at home treating herself with supportive care, over the last couple of days has been experiencing increased shortness of breath, extreme malaise, very poor appetite, she also reports intermittent cough with wheezing has used her albuterol inhaler multiple times during the day,, last 2 days she has been running fevers in the 101 range, also reports severe nausea, and very poor p.o. intake. ED Course: In the emergency room she was noted to be mildly hypoxic requiring 1 L oxygen to keep her sats in the high 90s,, labs were unremarkable except for a white count of 2.9, inflammatory markers are pending at this time  Review of Systems: As per HPI otherwise 14 point review of systems negative.   Past Medical History:  Diagnosis Date  . Allergy    seasonal, PCN, antidepressants, bentyl  . Arthritis   . Asthma   . Carpal tunnel syndrome on both sides    right worse than left 2008  . COPD (chronic obstructive pulmonary disease) ([place])   . GERD (gastroesophageal reflux disease)   . Hyperlipidemia    LDL 108 JULY 2013  . IBS (irritable bowel syndrome)   . Multiple nevi 03/01/2015  . Pneumonia   . Positive TB test    From MArch 2009 note : Recent F/u at [facility]. CXRAy negative.  Positive TST in 2007 (26mm).  Unable to tolerate INH due to hepatotoxicity    Past Surgical History:  Procedure Laterality Date  . BREAST CYST EXCISION Right 2011  .  BREAST EXCISIONAL BIOPSY Left    benign  . BREAST LUMPECTOMY Right 1990's  . INDUCED ABORTION  2005  . VAGINAL HYSTERECTOMY  1983   partial; for abnormal bleeding     reports that she quit smoking about 6 years ago. Her smoking use included cigarettes. She has a 14.19 pack-year smoking history. She has never used smokeless tobacco. She reports current drug use. Drugs: Cocaine, Marijuana, and Benzodiazepines. She reports that she does not drink alcohol.  Allergies  Allergen Reactions  . Penicillins Other (See Comments)    REACTION: rash    Family History  Problem Relation Age of Onset  . Hypertension Mother   . Hyperlipidemia Mother   . Heart disease Mother   . Diabetes Mother   . Kidney disease Mother   . Stroke Father   . Diabetes Father   . Heart disease Father   . Hyperlipidemia Father   . Hypertension Father   . Kidney disease Father   . Cancer Sister        breast  . Mental illness Sister   . Diabetes Sister   . Kidney disease Sister   . Hypertension Sister   . Mental illness Brother   . Asthma Son   . Hypertension Maternal Aunt   . Hypertension Maternal Uncle   . Hypertension Paternal Aunt   . Kidney disease Paternal Aunt   . Hypertension Paternal Uncle   . Kidney disease Paternal Uncle      Prior to  Admission medications   Medication Sig Start Date End Date Taking? Authorizing Provider  Artificial Tear Ointment (DRY EYES OP) Apply 1 drop to eye daily as needed (dry eyes).    [provider]  Ascorbic Acid (VITAMIN C) 100 MG tablet Take 50 mg by mouth daily.    [provider]  cetirizine (ZYRTEC) 10 MG tablet Take 10 mg by mouth daily.    [provider]  dicyclomine (BENTYL) 10 MG capsule Take 1 capsule (10 mg total) by mouth every 8 (eight) hours as needed for spasms. 11/20/18   Armbruster, Carlota Raspberry, MD  Fluticasone-Salmeterol (ADVAIR DISKUS) 250-50 MCG/DOSE AEPB Inhale 1 puff into the lungs 2 (two) times daily. 01/02/18 01/02/19   Ledell Noss, MD  linaclotide [facility]) 145 MCG CAPS capsule Take 1 capsule (145 mcg total) by mouth daily before breakfast. Take for 3 days.  If no better, may increase to 2 tablets daily 11/20/18   Armbruster, Carlota Raspberry, MD  linaclotide [facility]) 145 MCG CAPS capsule Take 1 capsule (145 mcg total) by mouth daily before breakfast. 11/26/18   Armbruster, Carlota Raspberry, MD  loratadine (CLARITIN) 10 MG tablet Take 10 mg by mouth daily.    [provider]  Magnesium Hydroxide (MILK OF MAGNESIA PO) Take by mouth as needed.    [provider]  mometasone (NASONEX) 50 MCG/ACT nasal spray PLACE 2 SPRAYS IN EACH NOSTRIL ONCE DAILY AS NEEDED. 02/03/19   Ledell Noss, MD  Multiple Vitamin (MULTIVITAMIN) capsule Take 1 capsule by mouth daily.    [provider]  pantoprazole (PROTONIX) 40 MG tablet Take 1 tablet (40 mg total) by mouth daily. 06/24/18   Ledell Noss, MD  Probiotic Product (PROBIOTIC DAILY PO) Take by mouth.    [provider]  PROVENTIL HFA 108 (90 Base) MCG/ACT inhaler INHALE 2 PUFFS INTO THE LUNGS EVERY 6 HOURS AS NEEDED FOR WHEEZING. 02/27/19   Ledell Noss, MD  ROZEREM 8 MG tablet TAKE 1 TABLET (8MG  TOTAL) BY MOUTH AT BEDTIME AS NEEDED FOR SLEEP. 12/16/18   Ledell Noss, MD  zaleplon (SONATA) 5 MG capsule Take 1 capsule (5 mg total) by mouth at bedtime as needed for sleep. 08/08/18   Asencion Noble, MD    Physical Exam: Vitals:   03/02/19 1300 03/02/19 1330 03/02/19 1401 03/02/19 1430  BP: 111/64 (!) 118/58 109/69 115/65  Pulse: 79 79 80 82  Resp: 18 17 17 17   Temp:      TempSrc:      SpO2: 96% 100% 99% 97%      Constitutional: Thinly built African-American female, sitting up in bed, ill-appearing, AAO x3 Vitals:   03/02/19 1300 03/02/19 1330 03/02/19 1401 03/02/19 1430  BP: 111/64 (!) 118/58 109/69 115/65  Pulse: 79 79 80 82  Resp: 18 17 17 17   Temp:      TempSrc:      SpO2: 96% 100% 99% 97%   Eyes: PERRL, lids and conjunctivae normal ENMT: Dry mucous  membranes Neck: normal, supple Respiratory: Poor air movement bilaterally, rare expiratory wheezes, Cardiovascular: Regular rate and rhythm, no murmurs / rubs / gallops Abdomen: soft, non tender, Bowel sounds positive.  Musculoskeletal: No joint deformity upper and lower extremities. Ext: No edema Skin: no rashes, lesions, ulcers.  Neurologic: Nonfocal Psychiatric: Normal judgment and insight. Alert and oriented x 3. Normal mood.   Labs on Admission: I have personally reviewed following labs and imaging studies  CBC: Recent Labs  Lab 02/24/19 2250 03/02/19 1345  WBC 1.9*  2.9*  NEUTROABS 1.1* 2.2  HGB 12.3 11.7*  HCT 37.5 35.7*  MCV 84.7 82.6  PLT 158 542   Basic Metabolic Panel: Recent Labs  Lab 02/24/19 2250 03/02/19 1345  NA 136 136  K 3.8 3.7  CL 104 103  CO2 24 23  GLUCOSE 112* 106*  BUN 12 14  CREATININE 0.81 0.93  CALCIUM 9.1 8.9   GFR: Estimated Creatinine Clearance: 56.4 mL/min (by C-G formula based on SCr of 0.93 mg/dL). Liver Function Tests: Recent Labs  Lab 02/24/19 2250 03/02/19 1345  AST 27 38  ALT 22 27  ALKPHOS 55 52  BILITOT 0.2* 0.6  PROT 6.7 6.9  ALBUMIN 4.1 3.6   No results for input(s): LIPASE, AMYLASE in the last 168 hours. No results for input(s): AMMONIA in the last 168 hours. Coagulation Profile: No results for input(s): INR, PROTIME in the last 168 hours. Cardiac Enzymes: Recent Labs  Lab 02/24/19 2345  TROPONINI <0.03   BNP (last 3 results) No results for input(s): PROBNP in the last 8760 hours. HbA1C: No results for input(s): HGBA1C in the last 72 hours. CBG: No results for input(s): GLUCAP in the last 168 hours. Lipid Profile: No results for input(s): CHOL, HDL, LDLCALC, TRIG, CHOLHDL, LDLDIRECT in the last 72 hours. Thyroid Function Tests: No results for input(s): TSH, T4TOTAL, FREET4, T3FREE, THYROIDAB in the last 72 hours. Anemia Panel: No results for input(s): VITAMINB12, FOLATE, FERRITIN, TIBC, IRON, RETICCTPCT  in the last 72 hours. Urine analysis:    Component Value Date/Time   COLORURINE YELLOW 12/11/2012 1434   APPEARANCEUR CLEAR 12/11/2012 1434   LABSPEC 1.007 12/11/2012 1434   PHURINE 6.0 12/11/2012 1434   GLUCOSEU NEG 12/11/2012 1434   HGBUR NEG 12/11/2012 1434   HGBUR negative 04/28/2010 1559   BILIRUBINUR neg 01/25/2015 1137   KETONESUR NEG 12/11/2012 1434   PROTEINUR neg 01/25/2015 1137   PROTEINUR NEG 12/11/2012 1434   UROBILINOGEN 0.2 01/25/2015 1137   UROBILINOGEN 0.2 12/11/2012 1434   NITRITE neg 01/25/2015 1137   NITRITE NEG 12/11/2012 1434   LEUKOCYTESUR Trace 01/25/2015 1137   Sepsis Labs: @LABRCNTIP (procalcitonin:4,lacticidven:4) ) Recent Results (from the past 240 hour(s))  Novel Coronavirus, NAA ([facility])     Status: Abnormal   Collection Time: 02/24/19  8:03 AM  Result Value Ref Range Status   SARS-CoV-2, NAA Detected (A) Not Detected Final    Comment: This test was developed and its performance characteristics determined by [employer]. This test has not been FDA cleared or approved. This test has been authorized by FDA under an Emergency Use Authorization (EUA). This test is only authorized for the duration of time the declaration that circumstances exist justifying the authorization of the emergency use of in vitro diagnostic tests for detection of SARS-CoV-2 virus and/or diagnosis of COVID-19 infection under section 564(b)(1) of the Act, 21 U.S.C. 706CBJ-6(E)(8), unless the authorization is terminated or revoked sooner. When diagnostic testing is negative, the possibility of a false negative result should be considered in the context of a patient's recent exposures and the presence of clinical signs and symptoms consistent with COVID-19. An individual without symptoms of COVID-19 and who is not shedding SARS-CoV-2 virus would expect to have a negative (not detected) result in this assay.   Blood culture (routine x 2)     Status: None (Preliminary  result)   Collection Time: 02/24/19 10:51 PM   Specimen: BLOOD  Result Value Ref Range Status   Specimen Description   Final  BLOOD RIGHT ANTECUBITAL Performed at [facility] [address], [place], [place] [address]    Special Requests   Final    BOTTLES DRAWN AEROBIC AND ANAEROBIC Blood Culture adequate volume Performed at [facility] [address], [place], [place] [address]    Culture   Final    NO GROWTH 4 DAYS Performed at [facility], [place] [address] [place], [place] [address]    Report Status PENDING  Incomplete  Blood culture (routine x 2)     Status: None (Preliminary result)   Collection Time: 02/24/19 10:56 PM   Specimen: BLOOD  Result Value Ref Range Status   Specimen Description   Final    BLOOD BLOOD RIGHT HAND Performed at [facility] [address] [place], [place] [address]    Special Requests   Final    BOTTLES DRAWN AEROBIC AND ANAEROBIC Blood Culture adequate volume Performed at [facility] [address], [place], [place] [address]    Culture   Final    NO GROWTH 4 DAYS Performed at [facility], [place] [address] [place],  [address]    Report Status PENDING  Incomplete     Radiological Exams on Admission: Dg Chest Portable 1 View  Result Date: 03/02/2019 CLINICAL DATA:  Shortness of breath. EXAM: PORTABLE CHEST 1 VIEW COMPARISON:  02/24/2019. FINDINGS: Mediastinum hilar structures normal. Heart size normal. Mild right base subsegmental atelectasis. COPD. No pleural effusion or pneumothorax. Degenerative change thoracic spine. IMPRESSION: Mild right base subsegmental atelectasis.  COPD. Electronically Signed   By: Marcello Moores  Register   On: 03/02/2019 12:52    EKG: Independently reviewed.  Sinus rhythm, right bundle branch block  Assessment/Plan  1.  Acute SARS COVID-19 infection -With minimal hypoxemia, currently requiring 1 L O2 nasal cannula  -Chest x-ray without acute findings -Occasional rare wheezes noted, will start IV Solu-Medrol -Check stat CRP ferritin d-dimer, if markers are significantly elevated or develops worsening hypoxemia will need CT chest, Remdesivir +/- Actemra -Transfer to [facility], admit to MedSurg unit  2.  Mild asthma exacerbation -Due to above, IV steroids, add albuterol MDI, scheduled and PRN  3.  History of GERD -Continue PPI  DVT prophylaxis: Lovenox Code Status: Patient wishes to be a DNR Family Communication: no family at bedside Disposition Plan: Home in 1 to 2 days if remains stable Consults called: none Admission status: Observation  Domenic Polite MD [facility]  03/02/2019, 3:44 PM

## 2019-03-02 NOTE — ED Triage Notes (Signed)
The patient comes from home with complaints of shortness of breath and weakness. No cough currently present. She recently tested positive for Covid 19.   EMS vitals: 110/80 BP 83 HR 124 CBG 99.0 temp 99% O2 on 3L nasal canula

## 2019-03-02 NOTE — ED Provider Notes (Signed)
[facility] DEPT Provider Note   CSN: [id number] Arrival date & time: 03/02/19  1115    History   Chief Complaint Chief Complaint  Patient presents with  . Shortness of Breath  . Weakness    HPI Aimee Davis is a 63 y.o. female.     63 y.o female with a PMH of COPD, GERD, asthma presents the ED with complaints of increased shortness of breath x4 days.  Patient was diagnosed with COVID 4 days ago, she reports while she is at home she is unable to keep her symptoms at [place].  States she is now feeling short of breath at rest, was placed on 2 L of oxygen by EMS bringing her sats back up to 100%.  She also endorses a productive cough, yellow to green in color.  She has been running a fever at home for the past 4 days with a T-max of 101.3, has been taking Tylenol for fever.  She also endorses myalgias, chills, weakness.  Patient currently lives alone and states she is having a hard time caring for self.  She denies any chest pain, abdominal pain, urinary complaints.     Past Medical History:  Diagnosis Date  . Allergy    seasonal, PCN, antidepressants, bentyl  . Arthritis   . Asthma   . Carpal tunnel syndrome on both sides    right worse than left 2008  . COPD (chronic obstructive pulmonary disease) ([place])   . GERD (gastroesophageal reflux disease)   . Hyperlipidemia    LDL 108 JULY 2013  . IBS (irritable bowel syndrome)   . Multiple nevi 03/01/2015  . Pneumonia   . Positive TB test    From MArch 2009 note : Recent F/u at [facility]. CXRAy negative.  Positive TST in 2007 (72mm).  Unable to tolerate INH due to hepatotoxicity    Patient Active Problem List   Diagnosis Date Noted  . Left foot pain 11/20/2018  . History of colon polyps 06/24/2018  . Eczema 05/02/2018  . 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 Leukopenia 02/19/2018  . Chronic abdominal pain 07/04/2017  . Seasonal allergies 03/29/2017  . Viral upper respiratory tract infection  06/18/2016  . Dizziness 10/27/2013  . Cataract 06/11/2013  . Healthcare maintenance 06/11/2013  . Positive TB test   . Primary insomnia 06/05/2012  . Weight loss 01/28/2009  . CARPAL TUNNEL SYNDROME, BILATERAL 03/29/2008  . Asthma 01/31/2007  . GERD (gastroesophageal reflux disease) 11/14/2006  . Irritable bowel syndrome 11/14/2006    Past Surgical History:  Procedure Laterality Date  . BREAST CYST EXCISION Right 2011  . BREAST EXCISIONAL BIOPSY Left    benign  . BREAST LUMPECTOMY Right 1990's  . INDUCED ABORTION  2005  . VAGINAL HYSTERECTOMY  1983   partial; for abnormal bleeding     OB History   No obstetric history on file.      Home Medications    Prior to Admission medications   Medication Sig Start Date End Date Taking? Authorizing Provider  Artificial Tear Ointment (DRY EYES OP) Apply 1 drop to eye daily as needed (dry eyes).    [provider]  Ascorbic Acid (VITAMIN C) 100 MG tablet Take 50 mg by mouth daily.    [provider]  cetirizine (ZYRTEC) 10 MG tablet Take 10 mg by mouth daily.    [provider]  dicyclomine (BENTYL) 10 MG capsule Take 1 capsule (10 mg total) by mouth every 8 (eight) hours as  needed for spasms. 11/20/18   Armbruster, Carlota Raspberry, MD  Fluticasone-Salmeterol (ADVAIR DISKUS) 250-50 MCG/DOSE AEPB Inhale 1 puff into the lungs 2 (two) times daily. 01/02/18 01/02/19  Ledell Noss, MD  linaclotide [facility]) 145 MCG CAPS capsule Take 1 capsule (145 mcg total) by mouth daily before breakfast. Take for 3 days.  If no better, may increase to 2 tablets daily 11/20/18   Armbruster, Carlota Raspberry, MD  linaclotide [facility]) 145 MCG CAPS capsule Take 1 capsule (145 mcg total) by mouth daily before breakfast. 11/26/18   Armbruster, Carlota Raspberry, MD  loratadine (CLARITIN) 10 MG tablet Take 10 mg by mouth daily.    [provider]  Magnesium Hydroxide (MILK OF MAGNESIA PO) Take by mouth as needed.    [provider]  mometasone  (NASONEX) 50 MCG/ACT nasal spray PLACE 2 SPRAYS IN EACH NOSTRIL ONCE DAILY AS NEEDED. 02/03/19   Ledell Noss, MD  Multiple Vitamin (MULTIVITAMIN) capsule Take 1 capsule by mouth daily.    [provider]  pantoprazole (PROTONIX) 40 MG tablet Take 1 tablet (40 mg total) by mouth daily. 06/24/18   Ledell Noss, MD  Probiotic Product (PROBIOTIC DAILY PO) Take by mouth.    [provider]  PROVENTIL HFA 108 (90 Base) MCG/ACT inhaler INHALE 2 PUFFS INTO THE LUNGS EVERY 6 HOURS AS NEEDED FOR WHEEZING. 02/27/19   Ledell Noss, MD  ROZEREM 8 MG tablet TAKE 1 TABLET (8MG  TOTAL) BY MOUTH AT BEDTIME AS NEEDED FOR SLEEP. 12/16/18   Ledell Noss, MD  zaleplon (SONATA) 5 MG capsule Take 1 capsule (5 mg total) by mouth at bedtime as needed for sleep. 08/08/18   Asencion Noble, MD    Family History Family History  Problem Relation Age of Onset  . Hypertension Mother   . Hyperlipidemia Mother   . Heart disease Mother   . Diabetes Mother   . Kidney disease Mother   . Stroke Father   . Diabetes Father   . Heart disease Father   . Hyperlipidemia Father   . Hypertension Father   . Kidney disease Father   . Cancer Sister        breast  . Mental illness Sister   . Diabetes Sister   . Kidney disease Sister   . Hypertension Sister   . Mental illness Brother   . Asthma Son   . Hypertension Maternal Aunt   . Hypertension Maternal Uncle   . Hypertension Paternal Aunt   . Kidney disease Paternal Aunt   . Hypertension Paternal Uncle   . Kidney disease Paternal Uncle     Social History Social History   Tobacco Use  . Smoking status: Former Smoker    Packs/day: 0.33    Years: 43.00    Pack years: 14.19    Types: Cigarettes    Quit date: 01/06/2013    Years since quitting: 6.1  . Smokeless tobacco: Never Used  Substance Use Topics  . Alcohol use: No    Alcohol/week: 0.0 standard drinks  . Drug use: Yes    Types: Cocaine, Marijuana, Benzodiazepines     Allergies   Penicillins    Review of Systems Review of Systems  Constitutional: Positive for chills and fever.  HENT: Negative for ear pain and sore throat.   Eyes: Negative for pain and visual disturbance.  Respiratory: Positive for shortness of breath. Negative for cough.   Cardiovascular: Positive for chest pain. Negative for palpitations.  Gastrointestinal: Negative for abdominal pain and vomiting.  Genitourinary:  Negative for dysuria and hematuria.  Musculoskeletal: Positive for myalgias. Negative for arthralgias and back pain.  Skin: Negative for color change and rash.  Neurological: Negative for seizures and syncope.  All other systems reviewed and are negative.    Physical Exam Updated Vital Signs BP 109/69   Pulse 80   Temp 98.4 F (36.9 C) (Axillary)   Resp 17   SpO2 99%   Physical Exam Vitals signs and nursing note reviewed.  Constitutional:      General: She is not in acute distress.    Appearance: She is well-developed. She is ill-appearing.  HENT:     Head: Normocephalic and atraumatic.     Mouth/Throat:     Pharynx: No oropharyngeal exudate.  Eyes:     Pupils: Pupils are equal, round, and reactive to light.  Neck:     Musculoskeletal: Normal range of motion.  Cardiovascular:     Rate and Rhythm: Regular rhythm.     Heart sounds: Normal heart sounds.  Pulmonary:     Effort: Pulmonary effort is normal. No respiratory distress.     Breath sounds: Examination of the right-middle field reveals decreased breath sounds. Examination of the right-lower field reveals decreased breath sounds. Decreased breath sounds present. No rhonchi or rales.  Chest:     Chest wall: No tenderness.  Abdominal:     General: Bowel sounds are normal. There is no distension.     Palpations: Abdomen is soft.     Tenderness: There is no abdominal tenderness.  Musculoskeletal:        General: No tenderness or deformity.     Right lower leg: No edema.     Left lower leg: No edema.  Skin:    General: Skin  is warm and dry.  Neurological:     Mental Status: She is alert and oriented to person, place, and time.      ED Treatments / Results  Labs (all labs ordered are listed, but only abnormal results are displayed) Labs Reviewed  CBC WITH DIFFERENTIAL/PLATELET - Abnormal; Notable for the following components:      Result Value   WBC 2.9 (*)    Hemoglobin 11.7 (*)    HCT 35.7 (*)    Lymphs Abs 0.4 (*)    All other components within normal limits  COMPREHENSIVE METABOLIC PANEL    EKG None  Radiology Dg Chest Portable 1 View  Result Date: 03/02/2019 CLINICAL DATA:  Shortness of breath. EXAM: PORTABLE CHEST 1 VIEW COMPARISON:  02/24/2019. FINDINGS: Mediastinum hilar structures normal. Heart size normal. Mild right base subsegmental atelectasis. COPD. No pleural effusion or pneumothorax. Degenerative change thoracic spine. IMPRESSION: Mild right base subsegmental atelectasis.  COPD. Electronically Signed   By: Marcello Moores  Register   On: 03/02/2019 12:52    Procedures Procedures (including critical care time)  Medications Ordered in ED Medications - No data to display   Initial Impression / Assessment and Plan / ED Course  I have reviewed the triage vital signs and the nursing notes.  Pertinent labs & imaging results that were available during my care of the patient were reviewed by me and considered in my medical decision making (see chart for details).    Patient with a past medical history of COPD, asthma presents to the ED with increase in shortness of breath.  Patient was diagnosed with coronavirus on February 24, 2019.  She reports she has been taking care of her symptoms at home but states she feels like she  is gotten worse.  She reports a T-max of 101.3, during EMS evaluation patient was found to be satting in the low 90s, she was placed on 2 L of oxygen. During initial evaluation patient is on 3 L of oxygen satting at 100%, I personally removed oxygen from patient's face, she  immediately desatted to 93%, requested oxygen to be placed back.  Will obtain laboratory examination with along with chest x-ray to further evaluate her condition.  CBC showed leukopenia, hemoglobin slightly decreased but stable.  Chest x-ray showed: Mild right base subsegmental atelectasis. COPD.    Patient currently lives at home on her own, she does not have oxygen at home that she can provide for herself.  I believe patient would benefit from admission as she is requiring oxygen at this time.   2:32 PM Spoke to hospitalist Dr. Broadus John who will admit patient for further management of hypoxia.   Final Clinical Impressions(s) / ED Diagnoses   Final diagnoses:  Weakness  Shortness of breath  COVID-19 virus infection    ED Discharge Orders    None       Janeece Fitting, PA-C 03/02/19 1432    Lacretia Leigh, MD 03/03/19 [phone]

## 2019-03-02 NOTE — ED Notes (Signed)
Bed: GF48 Expected date:  Expected time:  Means of arrival:  Comments: EMS COVID + 63 yo SHOB, weakness x 5 days.

## 2019-03-03 DIAGNOSIS — J4541 Moderate persistent asthma with (acute) exacerbation: Secondary | ICD-10-CM

## 2019-03-03 DIAGNOSIS — Z90710 Acquired absence of both cervix and uterus: Secondary | ICD-10-CM | POA: Diagnosis not present

## 2019-03-03 DIAGNOSIS — K589 Irritable bowel syndrome without diarrhea: Secondary | ICD-10-CM | POA: Diagnosis present

## 2019-03-03 DIAGNOSIS — J45901 Unspecified asthma with (acute) exacerbation: Secondary | ICD-10-CM | POA: Diagnosis present

## 2019-03-03 DIAGNOSIS — Z7951 Long term (current) use of inhaled steroids: Secondary | ICD-10-CM | POA: Diagnosis not present

## 2019-03-03 DIAGNOSIS — K219 Gastro-esophageal reflux disease without esophagitis: Secondary | ICD-10-CM | POA: Diagnosis present

## 2019-03-03 DIAGNOSIS — Z87891 Personal history of nicotine dependence: Secondary | ICD-10-CM | POA: Diagnosis not present

## 2019-03-03 DIAGNOSIS — Z79899 Other long term (current) drug therapy: Secondary | ICD-10-CM | POA: Diagnosis not present

## 2019-03-03 DIAGNOSIS — Z803 Family history of malignant neoplasm of breast: Secondary | ICD-10-CM | POA: Diagnosis not present

## 2019-03-03 DIAGNOSIS — Z880 Allergy status to penicillin: Secondary | ICD-10-CM | POA: Diagnosis not present

## 2019-03-03 DIAGNOSIS — Z66 Do not resuscitate: Secondary | ICD-10-CM | POA: Diagnosis present

## 2019-03-03 DIAGNOSIS — J449 Chronic obstructive pulmonary disease, unspecified: Secondary | ICD-10-CM | POA: Diagnosis present

## 2019-03-03 DIAGNOSIS — M1990 Unspecified osteoarthritis, unspecified site: Secondary | ICD-10-CM | POA: Diagnosis present

## 2019-03-03 DIAGNOSIS — Z8701 Personal history of pneumonia (recurrent): Secondary | ICD-10-CM | POA: Diagnosis not present

## 2019-03-03 DIAGNOSIS — R0602 Shortness of breath: Secondary | ICD-10-CM | POA: Diagnosis present

## 2019-03-03 DIAGNOSIS — Z825 Family history of asthma and other chronic lower respiratory diseases: Secondary | ICD-10-CM | POA: Diagnosis not present

## 2019-03-03 DIAGNOSIS — U071 COVID-19: Secondary | ICD-10-CM | POA: Diagnosis present

## 2019-03-03 DIAGNOSIS — Z888 Allergy status to other drugs, medicaments and biological substances status: Secondary | ICD-10-CM | POA: Diagnosis not present

## 2019-03-03 LAB — CBC
HCT: 31.7 % — ABNORMAL LOW (ref 36.0–46.0)
Hemoglobin: 10.4 g/dL — ABNORMAL LOW (ref 12.0–15.0)
MCH: 27 pg (ref 26.0–34.0)
MCHC: 32.8 g/dL (ref 30.0–36.0)
MCV: 82.3 fL (ref 80.0–100.0)
Platelets: 208 10*3/uL (ref 150–400)
RBC: 3.85 MIL/uL — ABNORMAL LOW (ref 3.87–5.11)
RDW: 12 % (ref 11.5–15.5)
WBC: 2.7 10*3/uL — ABNORMAL LOW (ref 4.0–10.5)
nRBC: 0 % (ref 0.0–0.2)

## 2019-03-03 LAB — CULTURE, BLOOD (ROUTINE X 2)
Culture: NO GROWTH
Culture: NO GROWTH
Special Requests: ADEQUATE
Special Requests: ADEQUATE

## 2019-03-03 LAB — BASIC METABOLIC PANEL
Anion gap: 10 (ref 5–15)
BUN: 14 mg/dL (ref 8–23)
CO2: 22 mmol/L (ref 22–32)
Calcium: 8.4 mg/dL — ABNORMAL LOW (ref 8.9–10.3)
Chloride: 107 mmol/L (ref 98–111)
Creatinine, Ser: 0.67 mg/dL (ref 0.44–1.00)
GFR calc Af Amer: >60 mL/min (ref >60)
GFR calc non Af Amer: >60 mL/min (ref >60)
Glucose, Bld: 172 mg/dL — ABNORMAL HIGH (ref 70–99)
Potassium: 4.2 mmol/L (ref 3.5–5.1)
Sodium: 139 mmol/L (ref 135–145)

## 2019-03-03 LAB — HIV ANTIBODY (ROUTINE TESTING W REFLEX): HIV Screen 4th Generation wRfx: NONREACTIVE

## 2019-03-03 MED ORDER — ADULT MULTIVITAMIN W/MINERALS CH
1.0000 | ORAL_TABLET | Freq: Every day | ORAL | Status: DC
  Administered 2019-03-03 – 2019-03-05 (×3): 1 via ORAL
  Filled 2019-03-03 (×3): qty 1

## 2019-03-03 MED ORDER — GUAIFENESIN-DM 100-10 MG/5ML PO SYRP
5.0000 mL | ORAL_SOLUTION | ORAL | Status: DC | PRN
  Administered 2019-03-03 – 2019-03-04 (×4): 5 mL via ORAL
  Filled 2019-03-03 (×4): qty 10

## 2019-03-03 MED ORDER — ENSURE ENLIVE PO LIQD
237.0000 mL | Freq: Three times a day (TID) | ORAL | Status: DC
  Administered 2019-03-03 – 2019-03-05 (×6): 237 mL via ORAL

## 2019-03-03 NOTE — Progress Notes (Signed)
PROGRESS NOTE                                                                                                                                                                                                             Patient Demographics:    Aimee Davis, is a 63 y.o. female, DOB - 1956/08/15, DPO:[id number]  Outpatient Primary MD for the patient is Ledell Noss, MD    LOS - 0  Chief Complaint  Patient presents with  . Shortness of Breath  . Weakness       Brief Narrative: Patient is a 63 y.o. female with PMHx of bronchial asthma, GERD-who tested positive for COVID-19 on 6/9 and was isolating with supportive care at home-presented with cough and shortness of breath-thought to have mild asthma exacerbation and subsequently admitted to the hospitalist service.   Subjective:    Burley Saver today continues to complain of some cough-she also complains of fatigue.  Shortness of breath is slightly better.   Assessment  & Plan :   Asthma exacerbation: Improved-still wheezing-but easily speaking in full sentences and appears comfortable-not yet back to baseline-continue steroids and bronchodilators.  COVID-19 infection: Chest x-ray without any pneumonia-appears relatively well and comfortable.  Afebrile.  CRP still significantly elevated-but without any significant hypoxia and without any imaging consistent with PNA-no indication for Remdesivir at this point.  COVID-19 Labs:  Recent Labs    03/02/19 1614  FERRITIN 270  CRP 15.5*    Lab Results  Component Value Date   SARSCOV2NAA Detected (A) 02/24/2019    GERD: Continue PPI    ABG:    Component Value Date/Time   TCO2 24 09/15/2017 1502   O2SAT 97.5 10/27/2013 1550    Condition - Stable  Family Communication  : None at bedside-patient is awake/alert-and will update family herself.  Code Status :  Full Code  Diet :  Diet Order            DIET SOFT  Room service appropriate? Yes; Fluid consistency: Thin  Diet effective now               Disposition Plan  :  Remain hospitalized-probably home on 6/17.  Consults  : None  Procedures  :  None  DVT Prophylaxis  :  Lovenox   Lab Results  Component Value Date  PLT 208 03/03/2019    Inpatient Medications  Scheduled Meds: . enoxaparin (LOVENOX) injection  40 mg Subcutaneous Q24H  . feeding supplement (ENSURE ENLIVE)  237 mL Oral TID BM  . linaclotide  145 mcg Oral QAC breakfast  . loratadine  10 mg Oral Daily  . methylPREDNISolone (SOLU-MEDROL) injection  60 mg Intravenous Q12H  . multivitamin with minerals  1 tablet Oral Daily  . pantoprazole  40 mg Oral Daily   Continuous Infusions: . sodium chloride 100 mL/hr at 03/03/19 0316   PRN Meds:.albuterol, dicyclomine, guaiFENesin-dextromethorphan, ramelteon  Antibiotics  :    Anti-infectives (From admission, onward)   None       Time Spent in minutes  25   Oren Binet M.D on 03/03/2019 at 11:47 AM  To page go to [email or web address] - use universal password  [facility] -  Office  [phone]  Admit date - 03/02/2019    0    Objective:   Vitals:   03/03/19 0600 03/03/19 0827 03/03/19 0900 03/03/19 1000  BP:  114/73    Pulse: (!) 59     Resp:  18    Temp:  98 F (36.7 C)    TempSrc:  Oral    SpO2: 94%  100% 95%  Weight:      Height:        Wt Readings from Last 3 Encounters:  03/02/19 64.6 kg  02/24/19 65.8 kg  11/26/18 67.1 kg     Intake/Output Summary (Last 24 hours) at 03/03/2019 1147 Last data filed at 03/03/2019 0600 Gross per 24 hour  Intake 1014.51 ml  Output -  Net 1014.51 ml     Physical Exam Gen Exam:Alert awake-not in any distress HEENT:atraumatic, normocephalic Chest: Moving air-some scattered rhonchi CVS:S1S2 regular Abdomen:soft non tender, non distended Extremities:no edema Neurology: Nonfocal Skin: no rash   Data Review:    CBC Recent Labs  Lab 02/24/19  2250 03/02/19 1345 03/03/19 0227  WBC 1.9* 2.9* 2.7*  HGB 12.3 11.7* 10.4*  HCT 37.5 35.7* 31.7*  PLT 158 221 208  MCV 84.7 82.6 82.3  MCH 27.8 27.1 27.0  MCHC 32.8 32.8 32.8  RDW 12.8 11.9 12.0  LYMPHSABS 0.5* 0.4*  --   MONOABS 0.3 0.3  --   EOSABS 0.0 0.0  --   BASOSABS 0.0 0.0  --     Chemistries  Recent Labs  Lab 02/24/19 2250 03/02/19 1345 03/03/19 0227  NA 136 136 139  K 3.8 3.7 4.2  CL 104 103 107  CO2 24 23 22   GLUCOSE 112* 106* 172*  BUN 12 14 14   CREATININE 0.81 0.93 0.67  CALCIUM 9.1 8.9 8.4*  AST 27 38  --   ALT 22 27  --   ALKPHOS 55 52  --   BILITOT 0.2* 0.6  --    ------------------------------------------------------------------------------------------------------------------ No results for input(s): CHOL, HDL, LDLCALC, TRIG, CHOLHDL, LDLDIRECT in the last 72 hours.  Lab Results  Component Value Date   HGBA1C 5.3 12/28/2015   ------------------------------------------------------------------------------------------------------------------ No results for input(s): TSH, T4TOTAL, T3FREE, THYROIDAB in the last 72 hours.  Invalid input(s): FREET3 ------------------------------------------------------------------------------------------------------------------ Recent Labs    03/02/19 1614  FERRITIN 270    Coagulation profile No results for input(s): INR, PROTIME in the last 168 hours.  No results for input(s): DDIMER in the last 72 hours.  Cardiac Enzymes Recent Labs  Lab 02/24/19 2345  TROPONINI <0.03   ------------------------------------------------------------------------------------------------------------------ No results found for: BNP  Micro Results Recent Results (from the  past 240 hour(s))  Novel Coronavirus, NAA ([facility])     Status: Abnormal   Collection Time: 02/24/19  8:03 AM  Result Value Ref Range Status   SARS-CoV-2, NAA Detected (A) Not Detected Final    Comment: This test was developed and its performance  characteristics determined by [employer]. This test has not been FDA cleared or approved. This test has been authorized by FDA under an Emergency Use Authorization (EUA). This test is only authorized for the duration of time the declaration that circumstances exist justifying the authorization of the emergency use of in vitro diagnostic tests for detection of SARS-CoV-2 virus and/or diagnosis of COVID-19 infection under section 564(b)(1) of the Act, 21 U.S.C. 161WRU-0(A)(5), unless the authorization is terminated or revoked sooner. When diagnostic testing is negative, the possibility of a false negative result should be considered in the context of a patient's recent exposures and the presence of clinical signs and symptoms consistent with COVID-19. An individual without symptoms of COVID-19 and who is not shedding SARS-CoV-2 virus would expect to have a negative (not detected) result in this assay.   Blood culture (routine x 2)     Status: None (Preliminary result)   Collection Time: 02/24/19 10:51 PM   Specimen: BLOOD  Result Value Ref Range Status   Specimen Description   Final    BLOOD RIGHT ANTECUBITAL Performed at [facility] [address], [place], [place] [address]    Special Requests   Final    BOTTLES DRAWN AEROBIC AND ANAEROBIC Blood Culture adequate volume Performed at [facility] [address] [place], [place] [address]    Culture   Final    NO GROWTH 4 DAYS Performed at [facility], [place] [address] [place], [place] [address]    Report Status PENDING  Incomplete  Blood culture (routine x 2)     Status: None (Preliminary result)   Collection Time: 02/24/19 10:56 PM   Specimen: BLOOD  Result Value Ref Range Status   Specimen Description   Final    BLOOD BLOOD RIGHT HAND Performed at [facility] [address] [place], [place] [address]    Special Requests   Final    BOTTLES DRAWN  AEROBIC AND ANAEROBIC Blood Culture adequate volume Performed at [facility] [address] [place], [place] [address]    Culture   Final    NO GROWTH 4 DAYS Performed at [facility], [place] [address] [place],  [address]    Report Status PENDING  Incomplete    Radiology Reports Dg Chest 2 View  Result Date: 02/24/2019 CLINICAL DATA:  Initial evaluation for acute cough, chest congestion. EXAM: CHEST - 2 VIEW COMPARISON:  Prior radiograph from 12/31/2017. FINDINGS: Cardiac and mediastinal silhouettes are stable in size and contour, and remain within normal limits. Lungs hyperinflated with attenuation of the pulmonary markings, compatible with emphysema. No focal infiltrates. No edema or effusion. No pneumothorax. No acute osseous finding. IMPRESSION: 1. Hyperinflation with underlying COPD. 2. No other active cardiopulmonary disease. Electronically Signed   By: Jeannine Boga M.D.   On: 02/24/2019 22:06   Dg Chest Portable 1 View  Result Date: 03/02/2019 CLINICAL DATA:  Shortness of breath. EXAM: PORTABLE CHEST 1 VIEW COMPARISON:  02/24/2019. FINDINGS: Mediastinum hilar structures normal. Heart size normal. Mild right base subsegmental atelectasis. COPD. No pleural effusion or pneumothorax. Degenerative change thoracic spine. IMPRESSION: Mild right base subsegmental atelectasis.  COPD. Electronically Signed  By: [place]   On: 03/02/2019 12:52

## 2019-03-04 MED ORDER — PREDNISONE 20 MG PO TABS
50.0000 mg | ORAL_TABLET | Freq: Every day | ORAL | Status: DC
  Administered 2019-03-05: 50 mg via ORAL
  Filled 2019-03-04: qty 1

## 2019-03-04 NOTE — Progress Notes (Signed)
[facility] PROGRESS NOTE    Progress Note  [name]  IOM:[id number] DOB: [birth year] DOA: 03/02/2019 PCP: Ledell Noss, MD     Brief Narrative:   Aimee Davis is an 63 y.o. female past medical history of asthma, GERD who tested positive for SARS-CoV-2 on 02/24/2019 she was isolated at home, she came into the ED with cough and shortness of breath and mild eczema exacerbation.  Assessment/Plan:   Asthma exacerbation: Her saturations have remained greater than 88% on room air, she is currently on steroids and inhalers.  COVID-19 virus infection: Question if her  asthma exacerbation is due to her SARS-CoV-2 infection. X-ray does not show any infiltrates she has remained afebrile. CRP is mildly elevated but without any significant hypoxia. No indication for Remdesivir at this point.  She is currently on steroids for her asthma exacerbation. COVID-19 Labs  Recent Labs    03/02/19 1614  FERRITIN 270  CRP 15.5*    Lab Results  Component Value Date   SARSCOV2NAA Detected (A) 02/24/2019     DVT prophylaxis: lovenox Family Communication:none Disposition Plan/Barrier to D/C: unable to determine Code Status:     Code Status Orders  (From admission, onward)         Start     Ordered   03/02/19 1810  Do not attempt resuscitation (DNR)  Continuous    Question Answer Comment  In the event of cardiac or respiratory ARREST Do not call a "code blue"   In the event of cardiac or respiratory ARREST Do not perform Intubation, CPR, defibrillation or ACLS   In the event of cardiac or respiratory ARREST Use medication by any route, position, wound care, and other measures to relive pain and suffering. May use oxygen, suction and manual treatment of airway obstruction as needed for comfort.      03/02/19 1809        Code Status History    Date Active Date Inactive Code Status Order ID Comments User Context   10/27/2013 1925 10/28/2013 1842 Full Code [id number]  Jessee Avers, MD Inpatient   Advance Care Planning Activity        IV Access:    Peripheral IV   Procedures and diagnostic studies:   Dg Chest Portable 1 View  Result Date: 03/02/2019 CLINICAL DATA:  Shortness of breath. EXAM: PORTABLE CHEST 1 VIEW COMPARISON:  02/24/2019. FINDINGS: Mediastinum hilar structures normal. Heart size normal. Mild right base subsegmental atelectasis. COPD. No pleural effusion or pneumothorax. Degenerative change thoracic spine. IMPRESSION: Mild right base subsegmental atelectasis.  COPD. Electronically Signed   By: Marcello Moores  Register   On: 03/02/2019 12:52     Medical Consultants:    None.  Anti-Infectives:  None  Subjective:    Aimee Davis she relates her breathing is improved compared to yesterday.  Objective:    Vitals:   03/04/19 0255 03/04/19 0300 03/04/19 0457 03/04/19 0500  BP:   114/66   Pulse: (!) 47 (!) 55 (!) 57 (!) 58  Resp:      Temp:   98.1 F (36.7 C)   TempSrc:   Oral   SpO2: 97% 97% 98% 94%  Weight:      Height:        Intake/Output Summary (Last 24 hours) at 03/04/2019 0807 Last data filed at 03/03/2019 1201 Gross per 24 hour  Intake 240 ml  Output -  Net 240 ml   Filed Weights   03/02/19 1800  Weight: 64.6 kg  Exam: General exam: In no acute distress. Respiratory system: Good air movement and clear to auscultation. Cardiovascular system: S1 & S2 heard, RRR. No JVD, murmurs. Gastrointestinal system: Abdomen is nondistended, soft and nontender.  Central nervous system: Alert and oriented. No focal neurological deficits. Extremities: No pedal edema. Skin: No rashes, lesions or ulcers Psychiatry: Judgement and insight appear normal. Mood & affect appropriate.     Data Reviewed:    Labs: Basic Metabolic Panel: Recent Labs  Lab 03/02/19 1345 03/03/19 0227  NA 136 139  K 3.7 4.2  CL 103 107  CO2 23 22  GLUCOSE 106* 172*  BUN 14 14  CREATININE 0.93 0.67  CALCIUM 8.9 8.4*   GFR Estimated  Creatinine Clearance: 65 mL/min (by C-G formula based on SCr of 0.67 mg/dL). Liver Function Tests: Recent Labs  Lab 03/02/19 1345  AST 38  ALT 27  ALKPHOS 52  BILITOT 0.6  PROT 6.9  ALBUMIN 3.6   No results for input(s): LIPASE, AMYLASE in the last 168 hours. No results for input(s): AMMONIA in the last 168 hours. Coagulation profile No results for input(s): INR, PROTIME in the last 168 hours. COVID-19 Labs  Recent Labs    03/02/19 1614  FERRITIN 270  CRP 15.5*    Lab Results  Component Value Date   SARSCOV2NAA Detected (A) 02/24/2019    CBC: Recent Labs  Lab 03/02/19 1345 03/03/19 0227  WBC 2.9* 2.7*  NEUTROABS 2.2  --   HGB 11.7* 10.4*  HCT 35.7* 31.7*  MCV 82.6 82.3  PLT 221 208   Cardiac Enzymes: No results for input(s): CKTOTAL, CKMB, CKMBINDEX, TROPONINI in the last 168 hours. BNP (last 3 results) No results for input(s): PROBNP in the last 8760 hours. CBG: No results for input(s): GLUCAP in the last 168 hours. D-Dimer: No results for input(s): DDIMER in the last 72 hours. Hgb A1c: No results for input(s): HGBA1C in the last 72 hours. Lipid Profile: No results for input(s): CHOL, HDL, LDLCALC, TRIG, CHOLHDL, LDLDIRECT in the last 72 hours. Thyroid function studies: No results for input(s): TSH, T4TOTAL, T3FREE, THYROIDAB in the last 72 hours.  Invalid input(s): FREET3 Anemia work up: Recent Labs    03/02/19 [place]   Sepsis Labs: Recent Labs  Lab 03/02/19 1345 03/03/19 0227  WBC 2.9* 2.7*   Microbiology Recent Results (from the past 240 hour(s))  Novel Coronavirus, NAA ([facility])     Status: Abnormal   Collection Time: 02/24/19  8:03 AM  Result Value Ref Range Status   SARS-CoV-2, NAA Detected (A) Not Detected Final    Comment: This test was developed and its performance characteristics determined by [employer]. This test has not been FDA cleared or approved. This test has been authorized by FDA under an  Emergency Use Authorization (EUA). This test is only authorized for the duration of time the declaration that circumstances exist justifying the authorization of the emergency use of in vitro diagnostic tests for detection of SARS-CoV-2 virus and/or diagnosis of COVID-19 infection under section 564(b)(1) of the Act, 21 U.S.C. 235TIR-4(E)(3), unless the authorization is terminated or revoked sooner. When diagnostic testing is negative, the possibility of a false negative result should be considered in the context of a patient's recent exposures and the presence of clinical signs and symptoms consistent with COVID-19. An individual without symptoms of COVID-19 and who is not shedding SARS-CoV-2 virus would expect to have a negative (not detected) result in this assay.   Blood culture (routine  x 2)     Status: None   Collection Time: 02/24/19 10:51 PM   Specimen: BLOOD  Result Value Ref Range Status   Specimen Description   Final    BLOOD RIGHT ANTECUBITAL Performed at [facility] [address] [place], [place] [address]    Special Requests   Final    BOTTLES DRAWN AEROBIC AND ANAEROBIC Blood Culture adequate volume Performed at [facility] [address] [place], [place] [address]    Culture   Final    NO GROWTH 6 DAYS Performed at [facility], [place] [address] [place], [place] [address]    Report Status 03/03/2019 FINAL  Final  Blood culture (routine x 2)     Status: None   Collection Time: 02/24/19 10:56 PM   Specimen: BLOOD  Result Value Ref Range Status   Specimen Description   Final    BLOOD BLOOD RIGHT HAND Performed at [facility] [address] [place], [place] [address]    Special Requests   Final    BOTTLES DRAWN AEROBIC AND ANAEROBIC Blood Culture adequate volume Performed at [facility] [address] [place], [place] [address]    Culture   Final    NO GROWTH 6 DAYS  Performed at [facility], [place] [address] [place], [place] [address]    Report Status 03/03/2019 FINAL  Final     Medications:   . enoxaparin (LOVENOX) injection  40 mg Subcutaneous Q24H  . feeding supplement (ENSURE ENLIVE)  237 mL Oral TID BM  . linaclotide  145 mcg Oral QAC breakfast  . loratadine  10 mg Oral Daily  . methylPREDNISolone (SOLU-MEDROL) injection  60 mg Intravenous Q12H  . multivitamin with minerals  1 tablet Oral Daily  . pantoprazole  40 mg Oral Daily   Continuous Infusions:    LOS: 1 day   Charlynne Cousins  [facility]  03/04/2019, 8:07 AM

## 2019-03-04 NOTE — Plan of Care (Signed)
SATURATION QUALIFICATIONS: (This note is used to comply with regulatory documentation for home oxygen)  Patient Saturations on Room Air at Rest = 91%  Patient Saturations on Room Air while Ambulating = 91%  Patient ambulated 130 feet with walker, no issues.

## 2019-03-04 NOTE — Progress Notes (Signed)
Initial Nutrition Assessment   RD working remotely.   DOCUMENTATION CODES:   Not applicable  INTERVENTION:   Ensure Enlive po BID, each supplement provides 350 kcal and 20 grams of protein  MVI with Minerals  NUTRITION DIAGNOSIS:   Inadequate oral intake related to nausea, acute illness, poor appetite as evidenced by per patient/family report.  GOAL:   Patient will meet greater than or equal to 90% of their needs  MONITOR:   PO intake, Supplement acceptance, Labs, Weight trends  REASON FOR ASSESSMENT:   Malnutrition Screening Tool    ASSESSMENT:   63 yo female who tested positive for COVID-19 on 6/09 and admitted on 6/15 with worsening symptoms including increased SOB, extreme malaise, very poor po intake, cough, fever and severe nausea.   PMH includes asthma, GERD, COPD, IBS, HLD. Pt quit smoking 6 years ago but reports current drug use: cocaine, marijuana and benzos  Pt ate 100% at breakfast this AM. +nausea and poor appetite/po intake PTA  No significant weight loss trend recently per weight encounters  Noted pt has been seen by outpatient RD in the past for weight loss associated with IBS  Labs: reviewed Meds: prednisone, MVI   NUTRITION - FOCUSED PHYSICAL EXAM:  Unable to assess  Diet Order:   Diet Order            DIET SOFT Room service appropriate? Yes; Fluid consistency: Thin  Diet effective now              EDUCATION NEEDS:   No education needs have been identified at this time  Skin:  Skin Assessment: Reviewed RN Assessment  Last BM:  no documented BM  Height:   Ht Readings from Last 1 Encounters:  03/02/19 5' 3.5" (1.613 m)    Weight:   Wt Readings from Last 1 Encounters:  03/02/19 64.6 kg    BMI:  Body mass index is 24.83 kg/m.  Estimated Nutritional Needs:   Kcal:  1900-2100 kcals  Protein:  90-105 g  Fluid:  >/= 1.9 L   Cate Alfreda Hammad MS, RDN, LDN, CNSC [phone] Pager  [phone] Weekend/On-Call  Pager

## 2019-03-05 DIAGNOSIS — U071 COVID-19: Secondary | ICD-10-CM | POA: Diagnosis not present

## 2019-03-05 MED ORDER — ACETAMINOPHEN 325 MG PO TABS
650.0000 mg | ORAL_TABLET | Freq: Four times a day (QID) | ORAL | Status: DC | PRN
  Administered 2019-03-05: 650 mg via ORAL
  Filled 2019-03-05: qty 2

## 2019-03-05 MED ORDER — PREDNISONE 10 MG PO TABS: ORAL_TABLET | ORAL | 0 refills | Status: DC

## 2019-03-05 NOTE — Progress Notes (Signed)
OT Evaluation  PTA, pt lived alone and was independent with ADL and mobility. Per pt, pt with limited support due to "family being scared to be around me". Pt issued AE to assist with ADL and IADL tasks. Educated on fall prevention and energy conservation. Pt very appreciative. Recommend HHOT and 3in1/shower chair for safe DC. Pt very appreciative.     03/05/19 1100  OT Visit Information  Last OT Received On 03/05/19  Assistance Needed +1  History of Present Illness 63 y.o. female past medical history of asthma, GERD who tested positive for SARS-CoV-2 on 02/24/2019 she was isolated at home, she came into the ED with cough and shortness of breath and mild eczema exacerbation  Precautions  Precautions [place] expects to be discharged to: Private residence  Living Arrangements Alone  Available Help at Discharge Family;Available PRN/intermittently  Type of Home House  Home Access Stairs to enter  Entrance Stairs-Number of Steps 3-4  Entrance Stairs-Rails None  Home Layout One level  Bathroom Shower/Tub Tub/shower unit;Curtain  Corporate treasurer Yes  How Accessible Accessible via walker  Home Equipment None  Prior Function  Level of Independence Independent  Communication  Communication No difficulties  Pain Assessment  Pain Assessment No/denies pain  Cognition  Arousal/Alertness Awake/alert  Behavior During Therapy WFL for tasks assessed/performed  Overall Cognitive Status Within Functional Limits for tasks assessed  Upper Extremity Assessment  Upper Extremity Assessment Generalized weakness  Lower Extremity Assessment  Lower Extremity Assessment Defer to PT evaluation  ADL  Overall ADL's  Needs assistance/impaired  Functional mobility during ADLs Supervision/safety;Rolling walker  General ADL Comments Able to complete ADL with multiple rest breaks. Educated on energy conservation and use of AE to assist wtih ADL. Pt issued  a reacher and long handled spone to assist with ADL. Asked   Bed Mobility  Overal bed mobility Independent  Transfers  Overall transfer level Modified independent  Balance  Overall balance assessment Mild deficits observed, not formally tested  General Comments  General comments (skin integrity, edema, etc.) Educated on energy conservation(handout provided) and fall prevention (hand out provided)  Exercises  Exercises Other exercises  Other Exercises  Other Exercises Educated on level 1 theraband HEP  OT - End of Session  Activity Tolerance Patient tolerated treatment well  Patient left in bed;with call bell/phone within reach  Nurse Communication Mobility status;Other (comment) (DC needs)  OT Assessment  OT Recommendation/Assessment Patient needs continued OT Services  OT Visit Diagnosis Unsteadiness on feet (R26.81);Muscle weakness (generalized) (M62.81)  OT Problem List Decreased strength;Decreased activity tolerance;Decreased safety awareness;Decreased knowledge of use of DME or AE;Cardiopulmonary status limiting activity  Barriers to Discharge Decreased caregiver support (pt lives alone; family concerned about being with her)  OT Plan  OT Treatment/Interventions (ACUTE ONLY) Self-care/ADL training;Therapeutic exercise;Neuromuscular education;Energy conservation;DME and/or AE instruction;Therapeutic activities;Patient/family education  AM-PAC OT "6 Clicks" Daily Activity Outcome Measure (Version 2)  Help from another person eating meals? 4  Help from another person taking care of personal grooming? 4  Help from another person toileting, which includes using toliet, bedpan, or urinal? 4  Help from another person bathing (including washing, rinsing, drying)? 4  Help from another person to put on and taking off regular upper body clothing? 4  Help from another person to put on and taking off regular lower body clothing? 4  6 Click Score 24  OT Recommendation  Follow Up  Recommendations Home health OT  OT Equipment 3 in  1 bedside commode  Individuals Consulted  Consulted and Agree with Results and Recommendations Patient  Acute Rehab OT Goals  Patient Stated Goal to take care of myself  OT Goal Formulation All assessment and education complete, DC therapy (further OT to completed by HHOT)  OT Time Calculation  OT Start Time (ACUTE ONLY) 1130  OT Stop Time (ACUTE ONLY) 1212  OT Time Calculation (min) 42 min  OT General Charges  $OT Visit 1 Visit  OT Evaluation  $OT Eval Moderate Complexity 1 Mod  OT Treatments  $Self Care/Home Management  23-37 mins  Written Expression  Dominant Hand Right  Maurie Boettcher, OT/L   Acute OT Clinical Specialist [place] Pager [phone] Office [phone]

## 2019-03-05 NOTE — TOC Transition Note (Addendum)
Transition of Care [facility]) - CM/SW Discharge Note   Patient Details  Name: Aimee Davis MRN: [id number] Date of Birth: [birth year]  [facility]) CM/SW Contact:  Midge Minium RN, BSN, NCM-BC, ACM-RN [phone] (working remotely) Phone Number: 03/05/2019, 11:05 AM   Clinical Narrative:    CM spoke to the patient via phone to discuss the POC. Patient states living at home alone and being independent with her ADLs PTA. PCP verified as: Dr. Ledell Noss; states having an [facility], but no active insurance. MD entered orders for HHPT/OT, with a RW/BSC recommended, with the patient agreeable to CM arranging services for [facility] HH/DME. Patient indicated her son "should" be able to provide transportation home, but has concerns d/t COVID risk. Patient stated her family are involved and available to assist post discharge if needed. CM will arranged PTAR if transportation is needed, with patient verbalizing understanding. [place] referral given to [place] liaison; RW referral given to Learta Codding, [place] liaison; AVS updated. Patient will be provided with a thermometer and DME prior to her transitioning home.   ADDENDUM: 03/05/19 @ 1316-[name] RNCM-Patient will require PTAR transportation home. Address verified with PTAR arranged for 1430. Huey Romans will deliver the patients DME at her residence. No further needs from CM.   Final next level of care: [place] Barriers to Discharge: No Barriers Identified   Patient Goals and CMS Choice Patient states their goals for this hospitalization and ongoing recovery are:: "to return home and take care of myself" CMS Medicare.gov Compare Post Acute Care list provided to:: Patient Choice offered to / list presented to : Patient   Discharge Plan and Services                DME Arranged: [place] rolling DME Agency: [place] Date DME Agency Contacted: 03/05/19 Time DME Agency Contacted:  [id number] Representative spoke with at DME Agency: Learta Codding, liaison HH Arranged: PT, OT [facility] Agency: [facility]formerly [employer]) Date [place]: 03/05/19 Time [place]: 1104 Representative spoke with at [place]: [place], liaison  Social Determinants of Health ([place]) Interventions     Readmission Risk Interventions No flowsheet data found.

## 2019-03-05 NOTE — Discharge Summary (Signed)
Physician Discharge Summary  KIMMI ACOCELLA IRC:[id number] DOB: 1956/06/05 DOA: 03/02/2019  PCP: Ledell Noss, MD  Admit date: 03/02/2019 Discharge date: 03/05/2019  Admitted From: Home Disposition:  Home  Recommendations for Outpatient Follow-up:  1. Follow up with PCP in 1-2 weeks 2. Please obtain BMP/CBC in one week   Home Health:No Equipment/Devices:None  Discharge Condition:Stable CODE STATUS:Full Diet recommendation: Heart Healthy  Brief/Interim Summary: 63 y.o. female past medical history of asthma, GERD who tested positive for SARS-CoV-2 on 02/24/2019 she was isolated at home, she came into the ED with cough and shortness of breath and mild eczema exacerbation.  Discharge Diagnoses:  Principal Problem:   COVID-19 virus infection Active Problems:   Asthma   GERD (gastroesophageal reflux disease)   Irritable bowel syndrome Acute asthma exacerbation: She was started on IV steroids and inhalers respiratory status improved.She never became hypoxic but she was tachypneic as she relates that she felt winded on the day of discharge she relates that her breathing was back to baseline. She will continue steroid taper as an outpatient.  COVID-19 viral infection: Chest x-ray was done that did not show any infiltrates, her CRP was mildly elevated she had no hypoxia.  She remained afebrile. She was treated with IV steroids for her asthma exacerbation.   Discharge Instructions  Discharge Instructions    Diet - low sodium heart healthy   Complete by: As directed    Increase activity slowly   Complete by: As directed      Allergies as of 03/05/2019      Reactions   Penicillins Other (See Comments)   REACTION: rash      Medication List    TAKE these medications   B-complex with vitamin C tablet Take 1 tablet by mouth daily.   cetirizine 10 MG tablet Commonly known as: ZYRTEC Take 10 mg by mouth daily.   dicyclomine 10 MG capsule Commonly known as: BENTYL Take 1  capsule (10 mg total) by mouth every 8 (eight) hours as needed for spasms.   DRY EYES OP Apply 1 drop to eye daily as needed (dry eyes).   Fluticasone-Salmeterol 250-50 MCG/DOSE Aepb Commonly known as: Advair Diskus Inhale 1 puff into the lungs 2 (two) times daily.   linaclotide 145 MCG Caps capsule Commonly known as: LINZESS Take 1 capsule (145 mcg total) by mouth daily before breakfast.   loratadine 10 MG tablet Commonly known as: CLARITIN Take 10 mg by mouth daily.   MILK OF MAGNESIA PO Take by mouth as needed.   mometasone 50 MCG/ACT nasal spray Commonly known as: Nasonex PLACE 2 SPRAYS IN EACH NOSTRIL ONCE DAILY AS NEEDED.   multivitamin capsule Take 1 capsule by mouth daily.   pantoprazole 40 MG tablet Commonly known as: PROTONIX Take 1 tablet (40 mg total) by mouth daily.   predniSONE 10 MG tablet Commonly known as: DELTASONE Takes 6 tablets for 1 days, then 5 tablets for 1 days, then 4 tablets for 1 days, then 3 tablets for 1 days, then 2 tabs for 1 days, then 1 tab for 1 days, and then stop.   PROBIOTIC DAILY PO Take by mouth.   Proventil HFA 108 (90 Base) MCG/ACT inhaler Generic drug: albuterol INHALE 2 PUFFS INTO THE LUNGS EVERY 6 HOURS AS NEEDED FOR WHEEZING.   Rozerem 8 MG tablet Generic drug: ramelteon TAKE 1 TABLET (8MG  TOTAL) BY MOUTH AT BEDTIME AS NEEDED FOR SLEEP.   vitamin C 100 MG tablet Take 50 mg by mouth daily.  zaleplon 5 MG capsule Commonly known as: SONATA Take 1 capsule (5 mg total) by mouth at bedtime as needed for sleep.       Allergies  Allergen Reactions  . Penicillins Other (See Comments)    REACTION: rash    Consultations:  None   Procedures/Studies: Dg Chest 2 View  Result Date: 02/24/2019 CLINICAL DATA:  Initial evaluation for acute cough, chest congestion. EXAM: CHEST - 2 VIEW COMPARISON:  Prior radiograph from 12/31/2017. FINDINGS: Cardiac and mediastinal silhouettes are stable in size and contour, and remain  within normal limits. Lungs hyperinflated with attenuation of the pulmonary markings, compatible with emphysema. No focal infiltrates. No edema or effusion. No pneumothorax. No acute osseous finding. IMPRESSION: 1. Hyperinflation with underlying COPD. 2. No other active cardiopulmonary disease. Electronically Signed   By: Jeannine Boga M.D.   On: 02/24/2019 22:06   Dg Chest Portable 1 View  Result Date: 03/02/2019 CLINICAL DATA:  Shortness of breath. EXAM: PORTABLE CHEST 1 VIEW COMPARISON:  02/24/2019. FINDINGS: Mediastinum hilar structures normal. Heart size normal. Mild right base subsegmental atelectasis. COPD. No pleural effusion or pneumothorax. Degenerative change thoracic spine. IMPRESSION: Mild right base subsegmental atelectasis.  COPD. Electronically Signed   By: Marcello Moores  Register   On: 03/02/2019 12:52    Subjective: No complaints feels great  Discharge Exam: Vitals:   03/04/19 2128 03/05/19 0802  BP: 136/72 122/77  Pulse: 74 (!) 103  Resp:    Temp: 98.5 F (36.9 C) 98 F (36.7 C)  SpO2: 96% (!) 88%     General: Pt is alert, awake, not in acute distress Cardiovascular: RRR, S1/S2 +, no rubs, no gallops Respiratory: CTA bilaterally, no wheezing, no rhonchi Abdominal: Soft, NT, ND, bowel sounds + Extremities: no edema, no cyanosis    The results of significant diagnostics from this hospitalization (including imaging, microbiology, ancillary and laboratory) are listed below for reference.     Microbiology: Recent Results (from the past 240 hour(s))  Novel Coronavirus, NAA ([facility])     Status: Abnormal   Collection Time: 02/24/19  8:03 AM  Result Value Ref Range Status   SARS-CoV-2, NAA Detected (A) Not Detected Final    Comment: This test was developed and its performance characteristics determined by [employer]. This test has not been FDA cleared or approved. This test has been authorized by FDA under an Emergency Use Authorization (EUA). This test  is only authorized for the duration of time the declaration that circumstances exist justifying the authorization of the emergency use of in vitro diagnostic tests for detection of SARS-CoV-2 virus and/or diagnosis of COVID-19 infection under section 564(b)(1) of the Act, 21 U.S.C. 366YQI-3(K)(7), unless the authorization is terminated or revoked sooner. When diagnostic testing is negative, the possibility of a false negative result should be considered in the context of a patient's recent exposures and the presence of clinical signs and symptoms consistent with COVID-19. An individual without symptoms of COVID-19 and who is not shedding SARS-CoV-2 virus would expect to have a negative (not detected) result in this assay.   Blood culture (routine x 2)     Status: None   Collection Time: 02/24/19 10:51 PM   Specimen: BLOOD  Result Value Ref Range Status   Specimen Description   Final    BLOOD RIGHT ANTECUBITAL Performed at [facility] [address], [place], [place] [address]    Special Requests   Final    BOTTLES DRAWN AEROBIC AND ANAEROBIC Blood Culture adequate volume Performed  at [facility], [place] [address] [place], [place] [address]    Culture   Final    NO GROWTH 6 DAYS Performed at [facility], [place] [address] [place], [place] [address]    Report Status 03/03/2019 FINAL  Final  Blood culture (routine x 2)     Status: None   Collection Time: 02/24/19 10:56 PM   Specimen: BLOOD  Result Value Ref Range Status   Specimen Description   Final    BLOOD BLOOD RIGHT HAND Performed at [facility] [address] [place], [place] [address]    Special Requests   Final    BOTTLES DRAWN AEROBIC AND ANAEROBIC Blood Culture adequate volume Performed at [facility] [address], [place], [place] [address]    Culture   Final    NO GROWTH 6 DAYS Performed at [facility], [place]  [address] [place], [place] [address]    Report Status 03/03/2019 FINAL  Final     Labs: BNP (last 3 results) No results for input(s): BNP in the last 8760 hours. Basic Metabolic Panel: Recent Labs  Lab 03/02/19 1345 03/03/19 0227  NA 136 139  K 3.7 4.2  CL 103 107  CO2 23 22  GLUCOSE 106* 172*  BUN 14 14  CREATININE 0.93 0.67  CALCIUM 8.9 8.4*   Liver Function Tests: Recent Labs  Lab 03/02/19 1345  AST 38  ALT 27  ALKPHOS 52  BILITOT 0.6  PROT 6.9  ALBUMIN 3.6   No results for input(s): LIPASE, AMYLASE in the last 168 hours. No results for input(s): AMMONIA in the last 168 hours. CBC: Recent Labs  Lab 03/02/19 1345 03/03/19 0227  WBC 2.9* 2.7*  NEUTROABS 2.2  --   HGB 11.7* 10.4*  HCT 35.7* 31.7*  MCV 82.6 82.3  PLT 221 208   Cardiac Enzymes: No results for input(s): CKTOTAL, CKMB, CKMBINDEX, TROPONINI in the last 168 hours. BNP: Invalid input(s): POCBNP CBG: No results for input(s): GLUCAP in the last 168 hours. D-Dimer No results for input(s): DDIMER in the last 72 hours. Hgb A1c No results for input(s): HGBA1C in the last 72 hours. Lipid Profile No results for input(s): CHOL, HDL, LDLCALC, TRIG, CHOLHDL, LDLDIRECT in the last 72 hours. Thyroid function studies No results for input(s): TSH, T4TOTAL, T3FREE, THYROIDAB in the last 72 hours.  Invalid input(s): FREET3 Anemia work up Recent Labs    03/02/19 1614  FERRITIN 270   Urinalysis    Component Value Date/Time   COLORURINE YELLOW 12/11/2012 [place] 12/11/2012 1434   LABSPEC 1.007 12/11/2012 1434   PHURINE 6.0 12/11/2012 1434   GLUCOSEU NEG 12/11/2012 1434   HGBUR NEG 12/11/2012 1434   HGBUR negative 04/28/2010 1559   BILIRUBINUR neg 01/25/2015 1137   KETONESUR NEG 12/11/2012 1434   PROTEINUR neg 01/25/2015 1137   PROTEINUR NEG 12/11/2012 1434   UROBILINOGEN 0.2 01/25/2015 1137   UROBILINOGEN 0.2 12/11/2012 1434   NITRITE neg 01/25/2015 1137   NITRITE NEG 12/11/2012  1434   LEUKOCYTESUR Trace 01/25/2015 1137   Sepsis Labs Invalid input(s): PROCALCITONIN,  WBC,  LACTICIDVEN Microbiology Recent Results (from the past 240 hour(s))  Novel Coronavirus, NAA ([facility])     Status: Abnormal   Collection Time: 02/24/19  8:03 AM  Result Value Ref Range Status   SARS-CoV-2, NAA Detected (A) Not Detected Final    Comment: This test was developed and its performance characteristics determined  by [employer]. This test has not been FDA cleared or approved. This test has been authorized by FDA under an Emergency Use Authorization (EUA). This test is only authorized for the duration of time the declaration that circumstances exist justifying the authorization of the emergency use of in vitro diagnostic tests for detection of SARS-CoV-2 virus and/or diagnosis of COVID-19 infection under section 564(b)(1) of the Act, 21 U.S.C. 440HKV-4(Q)(5), unless the authorization is terminated or revoked sooner. When diagnostic testing is negative, the possibility of a false negative result should be considered in the context of a patient's recent exposures and the presence of clinical signs and symptoms consistent with COVID-19. An individual without symptoms of COVID-19 and who is not shedding SARS-CoV-2 virus would expect to have a negative (not detected) result in this assay.   Blood culture (routine x 2)     Status: None   Collection Time: 02/24/19 10:51 PM   Specimen: BLOOD  Result Value Ref Range Status   Specimen Description   Final    BLOOD RIGHT ANTECUBITAL Performed at [facility] [address] [place], [place] [address]    Special Requests   Final    BOTTLES DRAWN AEROBIC AND ANAEROBIC Blood Culture adequate volume Performed at [facility] [address] [place], [place] [address]    Culture   Final    NO GROWTH 6 DAYS Performed at [facility], [place] [address], [place], [place] [address]    Report  Status 03/03/2019 FINAL  Final  Blood culture (routine x 2)     Status: None   Collection Time: 02/24/19 10:56 PM   Specimen: BLOOD  Result Value Ref Range Status   Specimen Description   Final    BLOOD BLOOD RIGHT HAND Performed at [facility] [address] [place], [place] [address]    Special Requests   Final    BOTTLES DRAWN AEROBIC AND ANAEROBIC Blood Culture adequate volume Performed at [facility] [address] [place], [place] [address]    Culture   Final    NO GROWTH 6 DAYS Performed at [facility], [place] [address] [place],  [address]    Report Status 03/03/2019 FINAL  Final     Time coordinating discharge: 40 minutes  SIGNED:   Charlynne Cousins, MD  [facility]

## 2019-03-05 NOTE — Evaluation (Signed)
Physical Therapy Evaluation Patient Details Name: Aimee Davis MRN: [id number] DOB: 1955-09-25 Today's Date: 03/05/2019   History of Present Illness  63 y.o. female past medical history of asthma, GERD who tested positive for SARS-CoV-2 on 02/24/2019 she was isolated at home, she came into the ED with cough and shortness of breath and mild eczema exacerbation  Clinical Impression  The patient presents with decreased energy and DOE with activity. Ambulated x 200', very slowly. Saturation 93%. Patient has limited assistance at DC. Recommend HHPT and RW and an aide if elligible. Pt admitted with above diagnosis. Pt currently with functional limitations due to the deficits listed below (see PT Problem List).  Pt will benefit from skilled PT to increase their independence and safety with mobility to allow discharge to the venue listed below.       Follow Up Recommendations Home health PT(aide)    Equipment Recommendations  Rolling walker with 5" wheels    Recommendations for Other Services       Precautions / Restrictions Precautions Precautions: Fall      Mobility  Bed Mobility Overal bed mobility: Independent                Transfers Overall transfer level: Modified independent                  Ambulation/Gait Ambulation/Gait assistance: Min guard;Modified independent (Device/Increase time) Gait Distance (Feet): 200 Feet Assistive device: Rolling walker (2 wheeled) Gait Pattern/deviations: Step-through pattern Gait velocity: decr   General Gait Details: decreased speed, stand res break x 2, noted 3/4 dyspnea. SaO2 93%.. Ambulated x 20' in room without Rw, cruised with objects, gait less steady  Stairs            Wheelchair Mobility    Modified Rankin (Stroke Patients Only)       Balance Overall balance assessment: Needs assistance Sitting-balance support: No upper extremity supported Sitting balance-Leahy Scale: Normal     Standing balance  support: During functional activity;No upper extremity supported Standing balance-Leahy Scale: Poor Standing balance comment: needs at least 1 UE support                             Pertinent Vitals/Pain      Home Living Family/patient expects to be discharged to:: Private residence Living Arrangements: Alone Available Help at Discharge: Family;Available PRN/intermittently Type of Home: House Home Access: Stairs to enter Entrance Stairs-Rails: None Entrance Stairs-Number of Steps: 3-4 Home Layout: One level Home Equipment: None      Prior Function Level of Independence: Independent               Hand Dominance        Extremity/Trunk Assessment   Upper Extremity Assessment Upper Extremity Assessment: Generalized weakness    Lower Extremity Assessment Lower Extremity Assessment: Generalized weakness    Cervical / Trunk Assessment Cervical / Trunk Assessment: Normal  Communication   Communication: No difficulties  Cognition Arousal/Alertness: Awake/alert Behavior During Therapy: WFL for tasks assessed/performed Overall Cognitive Status: Within Functional Limits for tasks assessed                                        General Comments      Exercises Other Exercises Other Exercises: pursed lip breaths   Assessment/Plan    PT Assessment Patient needs continued PT services  PT Problem List Decreased strength;Decreased activity tolerance;Decreased balance       PT Treatment Interventions DME instruction;Gait training;Functional mobility training;Therapeutic exercise;Therapeutic activities;Patient/family education    PT Goals (Current goals can be found in the Care Plan section)  Acute Rehab PT Goals Patient Stated Goal: to take care of myself PT Goal Formulation: With patient Time For Goal Achievement: 03/12/19 Potential to Achieve Goals: Good    Frequency Min 3X/week   Barriers to discharge Decreased caregiver  support      Co-evaluation               AM-PAC PT "6 Clicks" Mobility  Outcome Measure Help needed turning from your back to your side while in a flat bed without using bedrails?: None Help needed moving from lying on your back to sitting on the side of a flat bed without using bedrails?: None Help needed moving to and from a bed to a chair (including a wheelchair)?: None Help needed standing up from a chair using your arms (e.g., wheelchair or bedside chair)?: None Help needed to walk in hospital room?: A Little Help needed climbing 3-5 steps with a railing? : A Little 6 Click Score: 22    End of Session   Activity Tolerance: Patient limited by fatigue Patient left: in chair;with call bell/phone within reach Nurse Communication: Mobility status PT Visit Diagnosis: Difficulty in walking, not elsewhere classified (R26.2)    Time: 1423-9532 PT Time Calculation (min) (ACUTE ONLY): 32 min   Charges:   PT Evaluation $PT Eval Low Complexity: 1 Low PT Treatments $Gait Training: 8-22 mins        [place] [phone] Office [phone]   Claretha Cooper 03/05/2019, 10:46 AM

## 2019-03-05 NOTE — Discharge Instructions (Signed)
The decision to discontinue Transmission-Based Precautions for patients with confirmed COVID-19  should be made using either a test-based strategy or a symptom-based (i.e., time-since-illness-onset and time-since-recovery strategy) or time-based strategy as described below. Meeting criteria for discontinuation of Transmission-Based Precautions is not a prerequisite for discharge.  Symptomatic patients with COVID-19 should remain in Transmission-Based Precautions until either:  Symptom-based strategy At least 3 days (72 hours) have passed since recovery defined as resolution of fever without the use of fever-reducing medications and improvement in respiratory symptoms (e.g., cough, shortness of breath); and, At least 10 days have passed since symptoms first appeared Test-based strategy Resolution of fever without the use of fever-reducing medications and Improvement in respiratory symptoms (e.g., cough, shortness of breath), and Negative results of an FDA Emergency Use Authorized COVID-19 molecular assay for detection of SARS-CoV-2 RNA from at least two consecutive respiratory specimens collected ?24 hours apart (total of two negative specimens) [1]. See Interim Guidelines for Collecting, Handling, and Testing Clinical Specimens for 2019 Novel Coronavirus (2019-nCoV). Of note, there have been reports of prolonged detection of RNA without direct correlation to viral culture. Patients with laboratory-confirmed COVID-19 who have not had any symptoms should remain in Transmission-Based Precautions until either:  Time-based strategy 10 days have passed since the date of their first positive COVID-19 diagnostic test, assuming they have not subsequently developed symptoms since their positive test. Note, because symptoms cannot be used to gauge where these individuals are in the course of their illness, it is possible that the duration of viral shedding could be longer or shorter than 10 days after their first  positive test. Test-based strategy Negative results of an FDA Emergency Use Authorized COVID-19 molecular assay for detection of SARS-CoV-2 RNA from at least two consecutive respiratory specimens collected ?24 hours apart (total of two negative specimens). Note, because of the absence of symptoms, it is not possible to gauge where these individuals are in the course of their illness. There have been reports of prolonged detection of RNA without direct correlation to viral culture. Note that detecting viral RNA via PCR does not necessarily mean that infectious virus is present.

## 2019-03-06 ENCOUNTER — Emergency Department (HOSPITAL_COMMUNITY)
Admission: EM | Admit: 2019-03-06 | Discharge: 2019-03-07 | Disposition: A | Discharge status: Home / Self Care | Payer: No Typology Code available for payment source | Attending: Emergency Medicine | Admitting: Emergency Medicine

## 2019-03-06 ENCOUNTER — Other Ambulatory Visit: Payer: Self-pay

## 2019-03-06 ENCOUNTER — Emergency Department (HOSPITAL_COMMUNITY): Payer: No Typology Code available for payment source

## 2019-03-06 ENCOUNTER — Encounter (HOSPITAL_COMMUNITY): Payer: Self-pay | Admitting: Emergency Medicine

## 2019-03-06 DIAGNOSIS — Z87891 Personal history of nicotine dependence: Secondary | ICD-10-CM | POA: Insufficient documentation

## 2019-03-06 DIAGNOSIS — J45909 Unspecified asthma, uncomplicated: Secondary | ICD-10-CM | POA: Insufficient documentation

## 2019-03-06 DIAGNOSIS — R079 Chest pain, unspecified: Secondary | ICD-10-CM

## 2019-03-06 DIAGNOSIS — R0602 Shortness of breath: Secondary | ICD-10-CM

## 2019-03-06 DIAGNOSIS — R0600 Dyspnea, unspecified: Secondary | ICD-10-CM

## 2019-03-06 DIAGNOSIS — U071 COVID-19: Secondary | ICD-10-CM | POA: Insufficient documentation

## 2019-03-06 DIAGNOSIS — R0609 Other forms of dyspnea: Secondary | ICD-10-CM

## 2019-03-06 DIAGNOSIS — E785 Hyperlipidemia, unspecified: Secondary | ICD-10-CM | POA: Insufficient documentation

## 2019-03-06 DIAGNOSIS — Z79899 Other long term (current) drug therapy: Secondary | ICD-10-CM | POA: Insufficient documentation

## 2019-03-06 LAB — CBC WITH DIFFERENTIAL/PLATELET
Abs Immature Granulocytes: 0.33 10*3/uL — ABNORMAL HIGH (ref 0.00–0.07)
Basophils Absolute: 0 10*3/uL (ref 0.0–0.1)
Basophils Relative: 0 %
Eosinophils Absolute: 0 10*3/uL (ref 0.0–0.5)
Eosinophils Relative: 0 %
HCT: 33.1 % — ABNORMAL LOW (ref 36.0–46.0)
Hemoglobin: 10.7 g/dL — ABNORMAL LOW (ref 12.0–15.0)
Immature Granulocytes: 4 %
Lymphocytes Relative: 4 %
Lymphs Abs: 0.3 10*3/uL — ABNORMAL LOW (ref 0.7–4.0)
MCH: 26.8 pg (ref 26.0–34.0)
MCHC: 32.3 g/dL (ref 30.0–36.0)
MCV: 82.8 fL (ref 80.0–100.0)
Monocytes Absolute: 0.3 10*3/uL (ref 0.1–1.0)
Monocytes Relative: 4 %
Neutro Abs: 6.9 10*3/uL (ref 1.7–7.7)
Neutrophils Relative %: 88 %
Platelets: 290 10*3/uL (ref 150–400)
RBC: 4 MIL/uL (ref 3.87–5.11)
RDW: 13 % (ref 11.5–15.5)
WBC: 7.8 10*3/uL (ref 4.0–10.5)
nRBC: 0.4 % — ABNORMAL HIGH (ref 0.0–0.2)

## 2019-03-06 LAB — BASIC METABOLIC PANEL
Anion gap: 11 (ref 5–15)
BUN: 17 mg/dL (ref 8–23)
CO2: 23 mmol/L (ref 22–32)
Calcium: 9.1 mg/dL (ref 8.9–10.3)
Chloride: 107 mmol/L (ref 98–111)
Creatinine, Ser: 0.85 mg/dL (ref 0.44–1.00)
GFR calc Af Amer: >60 mL/min (ref >60)
GFR calc non Af Amer: >60 mL/min (ref >60)
Glucose, Bld: 175 mg/dL — ABNORMAL HIGH (ref 70–99)
Potassium: 4 mmol/L (ref 3.5–5.1)
Sodium: 141 mmol/L (ref 135–145)

## 2019-03-06 LAB — TROPONIN I
Troponin I: <0.03 ng/mL (ref <0.03)
Troponin I: <0.03 ng/mL (ref <0.03)

## 2019-03-06 LAB — POCT I-STAT EG7
Acid-Base Excess: 4 mmol/L — ABNORMAL HIGH (ref 0.0–2.0)
Bicarbonate: 27.1 mmol/L (ref 20.0–28.0)
Calcium, Ion: 1.21 mmol/L (ref 1.15–1.40)
HCT: 30 % — ABNORMAL LOW (ref 36.0–46.0)
Hemoglobin: 10.2 g/dL — ABNORMAL LOW (ref 12.0–15.0)
O2 Saturation: 99 %
Potassium: 3.9 mmol/L (ref 3.5–5.1)
Sodium: 140 mmol/L (ref 135–145)
TCO2: 28 mmol/L (ref 22–32)
pCO2, Ven: 33.1 mmHg — ABNORMAL LOW (ref 44.0–60.0)
pH, Ven: 7.521 — ABNORMAL HIGH (ref 7.250–7.430)
pO2, Ven: 147 mmHg — ABNORMAL HIGH (ref 32.0–45.0)

## 2019-03-06 MED ORDER — ALBUTEROL SULFATE HFA 108 (90 BASE) MCG/ACT IN AERS
8.0000 | INHALATION_SPRAY | Freq: Once | RESPIRATORY_TRACT | Status: AC
  Administered 2019-03-06: 8 via RESPIRATORY_TRACT
  Filled 2019-03-06: qty 6.7

## 2019-03-06 NOTE — ED Provider Notes (Signed)
[facility] EMERGENCY DEPARTMENT Provider Note   CSN: [id number] Arrival date & time: 03/06/19  1447    History   Chief Complaint Chief Complaint  Patient presents with   Chest Pain    HPI Aimee Davis is a 63 y.o. female.  HPI: A 63 year old patient presents for evaluation of chest pain. Initial onset of pain was more than 6 hours ago. The patient's chest pain is described as heaviness/pressure/tightness and is not worse with exertion. The patient's chest pain is middle- or left-sided, is not well-localized, is not sharp and does not radiate to the arms/jaw/neck. The patient does not complain of nausea and denies diaphoresis. The patient has no history of stroke, has no history of peripheral artery disease, has not smoked in the past 90 days, denies any history of treated diabetes, has no relevant family history of coronary artery disease (first degree relative at less than age 48), is not hypertensive, has no history of hypercholesterolemia and does not have an elevated BMI (>=30).   The history is provided by the patient and medical records.  Shortness of Breath Severity:  Moderate Onset quality:  Gradual Duration:  1 day Timing:  Intermittent Progression:  Unchanged Chronicity:  New Context: activity, emotional upset and URI   Context: not smoke exposure   Relieved by:  Nothing Worsened by:  Activity, deep breathing, coughing, stress, emotional stress and exertion Ineffective treatments:  Rest, position changes, sitting up and lying down Associated symptoms: chest pain, cough and PND   Associated symptoms: no fever, no headaches, no sore throat, no vomiting and no wheezing   Risk factors: no hx of PE/DVT, no obesity and no tobacco use     Past Medical History:  Diagnosis Date   Allergy    seasonal, PCN, antidepressants, bentyl   Arthritis    Asthma    Carpal tunnel syndrome on both sides    right worse than left 2008   COPD (chronic  obstructive pulmonary disease) (HCC)    GERD (gastroesophageal reflux disease)    Hyperlipidemia    LDL 108 JULY 2013   IBS (irritable bowel syndrome)    Multiple nevi 03/01/2015   Pneumonia    Positive TB test    From MArch 2009 note : Recent F/u at [facility]. CXRAy negative.  Positive TST in 2007 (70mm).  Unable to tolerate INH due to hepatotoxicity    Patient Active Problem List   Diagnosis Date Noted   COVID-19 virus infection 03/02/2019   Left foot pain 11/20/2018   History of colon polyps 06/24/2018   Eczema 05/02/2018   Constitutional neutropenia ([place]) 02/21/2018   Leukopenia 02/19/2018   Chronic abdominal pain 07/04/2017   Seasonal allergies 03/29/2017   Viral upper respiratory tract infection 06/18/2016   Dizziness 10/27/2013   Cataract 06/11/2013   Healthcare maintenance 06/11/2013   Positive TB test    Primary insomnia 06/05/2012   Weight loss 01/28/2009   CARPAL TUNNEL SYNDROME, BILATERAL 03/29/2008   Asthma 01/31/2007   GERD (gastroesophageal reflux disease) 11/14/2006   Irritable bowel syndrome 11/14/2006    Past Surgical History:  Procedure Laterality Date   BREAST CYST EXCISION Right 2011   BREAST EXCISIONAL BIOPSY Left    benign   BREAST LUMPECTOMY Right 1990's   INDUCED ABORTION  2005   VAGINAL HYSTERECTOMY  1983   partial; for abnormal bleeding     OB History   No obstetric history on file.      Home Medications  Prior to Admission medications   Medication Sig Start Date End Date Taking? Authorizing Provider  Artificial Tear Ointment (DRY EYES OP) Apply 1 drop to eye daily as needed (dry eyes).    [provider]  Ascorbic Acid (VITAMIN C) 100 MG tablet Take 50 mg by mouth daily.    [provider]  B Complex-C (B-COMPLEX WITH VITAMIN C) tablet Take 1 tablet by mouth daily.    [provider]  cetirizine (ZYRTEC) 10 MG tablet Take 10 mg by mouth daily.    [provider]    dicyclomine (BENTYL) 10 MG capsule Take 1 capsule (10 mg total) by mouth every 8 (eight) hours as needed for spasms. 11/20/18   Armbruster, Carlota Raspberry, MD  Fluticasone-Salmeterol (ADVAIR DISKUS) 250-50 MCG/DOSE AEPB Inhale 1 puff into the lungs 2 (two) times daily. 01/02/18 03/06/19  Ledell Noss, MD  linaclotide [facility]) 145 MCG CAPS capsule Take 1 capsule (145 mcg total) by mouth daily before breakfast. 11/26/18   Armbruster, Carlota Raspberry, MD  loratadine (CLARITIN) 10 MG tablet Take 10 mg by mouth daily.    [provider]  Magnesium Hydroxide (MILK OF MAGNESIA PO) Take by mouth as needed.    [provider]  mometasone (NASONEX) 50 MCG/ACT nasal spray PLACE 2 SPRAYS IN EACH NOSTRIL ONCE DAILY AS NEEDED. 02/03/19   Ledell Noss, MD  Multiple Vitamin (MULTIVITAMIN) capsule Take 1 capsule by mouth daily.    [provider]  pantoprazole (PROTONIX) 40 MG tablet Take 1 tablet (40 mg total) by mouth daily. 06/24/18   Ledell Noss, MD  predniSONE (DELTASONE) 10 MG tablet Takes 6 tablets for 1 days, then 5 tablets for 1 days, then 4 tablets for 1 days, then 3 tablets for 1 days, then 2 tabs for 1 days, then 1 tab for 1 days, and then stop. 03/05/19   Charlynne Cousins, MD  Probiotic Product (PROBIOTIC DAILY PO) Take by mouth.    [provider]  PROVENTIL HFA 108 (90 Base) MCG/ACT inhaler INHALE 2 PUFFS INTO THE LUNGS EVERY 6 HOURS AS NEEDED FOR WHEEZING. 02/27/19   Ledell Noss, MD  ROZEREM 8 MG tablet TAKE 1 TABLET (8MG  TOTAL) BY MOUTH AT BEDTIME AS NEEDED FOR SLEEP. 12/16/18   Ledell Noss, MD  zaleplon (SONATA) 5 MG capsule Take 1 capsule (5 mg total) by mouth at bedtime as needed for sleep. 08/08/18   Asencion Noble, MD    Family History Family History  Problem Relation Age of Onset   Hypertension Mother    Hyperlipidemia Mother    Heart disease Mother    Diabetes Mother    Kidney disease Mother    Stroke Father    Diabetes Father    Heart disease Father     Hyperlipidemia Father    Hypertension Father    Kidney disease Father    Cancer Sister        breast   Mental illness Sister    Diabetes Sister    Kidney disease Sister    Hypertension Sister    Mental illness Brother    Asthma Son    Hypertension Maternal Aunt    Hypertension Maternal Uncle    Hypertension Paternal Aunt    Kidney disease Paternal Aunt    Hypertension Paternal Uncle    Kidney disease Paternal Uncle     Social History Social History   Tobacco Use   Smoking status: Former Smoker    Packs/day: 0.33    Years: 43.00  Pack years: 14.19    Types: Cigarettes    Quit date: 01/06/2013    Years since quitting: 6.1   Smokeless tobacco: Never Used  Substance Use Topics   Alcohol use: No    Alcohol/week: 0.0 standard drinks   Drug use: Yes    Types: Cocaine, Marijuana, Benzodiazepines     Allergies   Penicillins   Review of Systems Review of Systems  Constitutional: Negative for fever.  HENT: Negative for sore throat.   Respiratory: Positive for cough and shortness of breath. Negative for wheezing.   Cardiovascular: Positive for chest pain and PND.  Gastrointestinal: Negative for vomiting.  Neurological: Negative for headaches.  All other systems reviewed and are negative.    Physical Exam Updated Vital Signs BP 117/69    Pulse 63    Temp 98.5 F (36.9 C) (Oral)    Resp 18    Ht 5' 3.5" (1.613 m)    Wt 64.6 kg    SpO2 95%    BMI 24.83 kg/m   Physical Exam Vitals signs and nursing note reviewed.  Constitutional:      Appearance: She is well-developed. She is not toxic-appearing.  HENT:     Head: Normocephalic and atraumatic.  Eyes:     Extraocular Movements: Extraocular movements intact.     Conjunctiva/sclera: Conjunctivae normal.  Neck:     Musculoskeletal: Neck supple.     Vascular: No JVD.  Cardiovascular:     Rate and Rhythm: Normal rate.     Pulses:          Radial pulses are 2+ on the right side and 2+ on the  left side.  Pulmonary:     Effort: Pulmonary effort is normal. No respiratory distress.     Breath sounds: No decreased breath sounds, wheezing or rhonchi.     Comments: O2 sat 96% with patient lying supine on room air Abdominal:     Palpations: Abdomen is soft.     Tenderness: There is no abdominal tenderness.  Musculoskeletal:     Right lower leg: No edema.     Left lower leg: No edema.  Skin:    General: Skin is warm and dry.     Capillary Refill: Capillary refill takes less than 2 seconds.     Findings: No rash.  Neurological:     Mental Status: She is alert and oriented to person, place, and time.      ED Treatments / Results  Labs (all labs ordered are listed, but only abnormal results are displayed) Labs Reviewed  CBC WITH DIFFERENTIAL/PLATELET - Abnormal; Notable for the following components:      Result Value   Hemoglobin 10.7 (*)    HCT 33.1 (*)    nRBC 0.4 (*)    Lymphs Abs 0.3 (*)    Abs Immature Granulocytes 0.33 (*)    All other components within normal limits  BASIC METABOLIC PANEL - Abnormal; Notable for the following components:   Glucose, Bld 175 (*)    All other components within normal limits  POCT I-STAT EG7 - Abnormal; Notable for the following components:   pH, Ven 7.521 (*)    pCO2, Ven 33.1 (*)    pO2, Ven 147.0 (*)    Acid-Base Excess 4.0 (*)    HCT 30.0 (*)    Hemoglobin 10.2 (*)    All other components within normal limits  TROPONIN I  TROPONIN I  I-STAT VENOUS BLOOD GAS, ED    EKG EKG  Interpretation  Date/Time:  Friday March 06 2019 14:58:20 EDT Ventricular Rate:  84 PR Interval:    QRS Duration: 93 QT Interval:  362 QTC Calculation: 428 R Axis:   27 Text Interpretation:  Sinus rhythm RSR' in V1 or V2, right VCD or RVH No significant change since last tracing Confirmed by Isla Pence [phone]) on 03/06/2019 3:22:54 PM   Radiology Dg Chest Portable 1 View  Result Date: 03/06/2019 CLINICAL DATA:  Three episodes of chest  pressure lasting 1-2 minutes. COVID-19 positive. EXAM: PORTABLE CHEST 1 VIEW COMPARISON:  03/02/2019 FINDINGS: Normal sized heart. Clear lungs. The lungs remain mildly hyperexpanded with mild accentuation of the interstitial markings and mild bilateral bullous changes. No acute bony abnormality. IMPRESSION: No acute abnormality. Stable mild changes of COPD. Electronically Signed   By: Claudie Revering M.D.   On: 03/06/2019 15:55    Procedures Procedures (including critical care time)  Medications Ordered in ED Medications  albuterol (VENTOLIN HFA) 108 (90 Base) MCG/ACT inhaler 8 puff (8 puffs Inhalation Given 03/06/19 1521)     Initial Impression / Assessment and Plan / ED Course  I have reviewed the triage vital signs and the nursing notes.  Pertinent labs & imaging results that were available during my care of the patient were reviewed by me and considered in my medical decision making (see chart for details).     HEAR Score: 2 Medical Decision Making: Aimee Davis is a 63 y.o. female who presented to the ED today with shortness of breath, chest tightness.  Past medical history significant for asthma, GERD, tested positive for COVID on 02/24/2019, hospitalized until 03/05/2019 Patient had no hypoxia during hospitalization, was treated with IV steroids for asthma exacerbation Discharged on steroid burst, patient endorses taking 60 mg of prednisone this a.m. Reviewed and confirmed nursing documentation for past medical history, family history, social history.  On my initial exam, the pt was calm, cooperative, conversant, follows commands appropriately, GCS 15, not tachycardic, not hypotensive, afebrile, no increased work of breathing or respiratory distress, no signs of impending respiratory failure.  We will give beta agonist therapy  Viral versus bacterial pneumonia considered however patient denies any infectious review of systems, no fevers, chills, no new cough, will obtain chest x-ray  for further evaluation Pneumothorax considered however bilateral breath sounds, will obtain chest x-ray for further evaluation Volume overload considered however no peripheral edema, no JVD present on physical exam ACS considered, will obtain EKG for further assessment, will calculate HEAR score VTE considered however patient low risk Wells score, no obvious asymmetric bilateral upper extremity, bilateral lower extremity swelling  EKG (my interpretation):      NS rhythm with a rate of  84.      QRS 93. QTc 428. PR 135.      No acute ischemic ST-T segment changes.      No acute changes suggestive of hyperkalemia.      No WPW, LQTS, or Brugada's Syndrome. When compared to previous ECGs from 02/25/19 change no new concerning changes found.  HEAR score of 2, will obtain troponin Delta troponin negative  All radiology and laboratory studies reviewed independently and with my attending physician, agree with reading provided by radiologist unless otherwise noted.  Upon reassessing patient, patient was calm, resting comfortably, no new complaints Based on the above findings, I believe patient is hemodynamically stable for discharge.  Patient educated about specific return precautions for given chief complaint and symptoms.  Patient educated about follow-up with PCP.  Patient expressed understanding of return precautions and need for follow-up.  Patient discharged. The above care was discussed with and agreed upon by my attending physician. Emergency Department Medication Summary:  Medications  albuterol (VENTOLIN HFA) 108 (90 Base) MCG/ACT inhaler 8 puff (8 puffs Inhalation Given 03/06/19 1521)    Final Clinical Impressions(s) / ED Diagnoses   Final diagnoses:  Nonspecific chest pain    ED Discharge Orders    None       Lonzo Candy, MD 03/06/19 1910    Isla Pence, MD 03/06/19 2256

## 2019-03-06 NOTE — ED Triage Notes (Signed)
Per [facility] pt coming from home with 3 episodes of chest pressure lasting about 1-2 minutes. Patient not currently having any pain. Given 324 aspirin en route. Patient states tested positive for Covid on the 9th and was just discharged.

## 2019-03-06 NOTE — Discharge Instructions (Addendum)

## 2019-03-07 NOTE — ED Notes (Signed)
Patient verbalizes understanding of discharge instructions. Opportunity for questioning and answers were provided. Armband removed by staff, pt discharged from ED.  

## 2019-03-11 ENCOUNTER — Telehealth: Payer: Self-pay

## 2019-03-11 NOTE — Telephone Encounter (Signed)
Received TC from patient.  Covid + on 6/9 and per patient, she was instructed to call PCP to make a follow up appt (several ED visit's since positive Covid testing).  She is still symptomatic with "mild SOB, coughing, and decreased energy level".  No distress noted during conversation and pt states she is feeling much better. Informed pt RN will forward message to attending pool to advise if in-person or telehealth visit needed and triage will call pt back, she verbalized understanding. SChaplin, RN,BSN

## 2019-03-11 NOTE — Telephone Encounter (Signed)
telehealth visit should be fine

## 2019-03-11 NOTE — Telephone Encounter (Signed)
Spoke with the Pt.  She has sch her Telehealth visit for 03/13/2019 @ 10:45am.

## 2019-03-12 ENCOUNTER — Encounter: Payer: Self-pay | Admitting: *Deleted

## 2019-03-13 ENCOUNTER — Ambulatory Visit (INDEPENDENT_AMBULATORY_CARE_PROVIDER_SITE_OTHER): Payer: HRSA Program | Admitting: Internal Medicine

## 2019-03-13 ENCOUNTER — Other Ambulatory Visit: Payer: Self-pay

## 2019-03-13 DIAGNOSIS — U071 COVID-19: Secondary | ICD-10-CM | POA: Diagnosis not present

## 2019-03-13 NOTE — Progress Notes (Signed)
  [facility] Internal Medicine Residency Telephone Encounter Continuity Care Appointment  HPI:   This telephone encounter was created for [name] on 03/13/2019 for the following purpose/cc COVID+ on 02/24/19. Aimee Davis reports continued improvement in symptoms but remains having a cough productive of white/clear phlegm and decreased energy. She is afebrile and her diarrhea, decreased appetite, decreased taste/smell and body aches have all resolved. She admits that her family is very fearful of being around her and she is very fearful of contracting the virus again. I educated her on transmission of the virus, benefits of mask and hand hygiene, and low likelihood of reinfection.    Past Medical History:  Past Medical History:  Diagnosis Date  . Allergy    seasonal, PCN, antidepressants, bentyl  . Arthritis   . Asthma   . Carpal tunnel syndrome on both sides    right worse than left 2008  . COPD (chronic obstructive pulmonary disease) ([place])   . GERD (gastroesophageal reflux disease)   . Hyperlipidemia    LDL 108 JULY 2013  . IBS (irritable bowel syndrome)   . Multiple nevi 03/01/2015  . Pneumonia   . Positive TB test    From MArch 2009 note : Recent F/u at [facility]. CXRAy negative.  Positive TST in 2007 (34mm).  Unable to tolerate INH due to hepatotoxicity      ROS:  Review of Systems  Constitutional: Positive for malaise/fatigue. Negative for chills and fever.  HENT: Negative for congestion and sore throat.   Respiratory: Positive for cough and sputum production. Negative for shortness of breath and wheezing.   Cardiovascular: Negative for chest pain.  Gastrointestinal: Negative for diarrhea.  Musculoskeletal: Negative for myalgias.  Neurological: Positive for weakness. Negative for headaches.       Assessment / Plan / Recommendations:   Please see A&P under problem oriented charting for assessment of the patient's acute and chronic medical conditions.   As always,  pt is advised that if symptoms worsen or new symptoms arise, they should go to an urgent care facility or to to ER for further evaluation.   Consent and Medical Decision Making:   Patient discussed with Dr. Angelia Mould  This is a telephone encounter between [place] on 03/13/2019 for COVID + f/u. The visit was conducted with the patient located at home and Isabelle Course at [facility]. The patient's identity was confirmed using their DOB and current address. The patient has consented to being evaluated through a telephone encounter and understands the associated risks (an examination cannot be done and the patient may need to come in for an appointment) / benefits (allows the patient to remain at home, decreasing exposure to coronavirus). I personally spent 15 minutes on medical discussion.

## 2019-03-14 NOTE — Assessment & Plan Note (Signed)
Improving. Denies sob, appetite is better. Still endorses productive cough and fatigue. No fevers, chills. Has been quarantining at home. Family is afraid to come visit her and she would like to get retested so her family can have some reassurance that it is safe to be around her.   - gave patient info for community testing centers   - f/u if symptoms worsen

## 2019-03-16 NOTE — Progress Notes (Signed)
Internal Medicine Clinic Attending  Case discussed with Dr. Vogel  at the time of the visit.  We reviewed the resident's history and exam and pertinent patient test results.  I agree with the assessment, diagnosis, and plan of care documented in the resident's note.  

## 2019-03-30 ENCOUNTER — Telehealth: Payer: Self-pay | Admitting: Internal Medicine

## 2019-03-30 DIAGNOSIS — U071 COVID-19: Secondary | ICD-10-CM

## 2019-03-30 NOTE — Telephone Encounter (Addendum)
Returned call to patient. States she's been having "spasms in my stomach" especially in AM. States she has a normal formed BM immediately after she eats every time which is upsetting to her; "Can't keep anything in my stomach." She is taking patoprazole and linzess but not bentyl . Discussed bentyl was sent to [facility] on 11/20/2018 by Dr. Havery Moros, specifically for these spasms. Confirmed with Dawn at [facility] they received this Rx, but they do not carry bentyl. [facility] outpatient pharmacy does carry it but it cannot be on the IM Program. Patient has no insurance at this time. Cost to patient would be $17.80. Cost is $18 at [place]. Patient would like the med sent to [facility] since it's right near her home. Will route to Dr. Maudie Mercury to see if she could help patient with [facility] for all meds.  Also, patient wonders if her emotions can be increasing symptoms. States she wants to cry all the time and she doesn't feel like herself; feeling fearful following Covid-19 dx. Discussed [facility] and patient would very much like to speak with Miquel Dunn. Referral placed. Hubbard Hartshorn, RN, BSN

## 2019-03-30 NOTE — Telephone Encounter (Signed)
Please call pt back.

## 2019-03-30 NOTE — Telephone Encounter (Signed)
Patient requesting a call back about her IBS symptoms.

## 2019-03-31 ENCOUNTER — Other Ambulatory Visit: Payer: Self-pay | Admitting: Internal Medicine

## 2019-03-31 MED ORDER — DICYCLOMINE HCL 10 MG PO CAPS: 10.0000 mg | ORAL_CAPSULE | Freq: Three times a day (TID) | ORAL | 0 refills | Status: DC | PRN

## 2019-03-31 NOTE — Telephone Encounter (Signed)
I have placed a 2 week prescription for bentyl to the [place] [facility]. Please have patient followup with Dr. Havery Moros in GI

## 2019-04-01 ENCOUNTER — Other Ambulatory Visit: Payer: Self-pay

## 2019-04-01 ENCOUNTER — Ambulatory Visit (INDEPENDENT_AMBULATORY_CARE_PROVIDER_SITE_OTHER): Payer: Self-pay | Admitting: Internal Medicine

## 2019-04-01 ENCOUNTER — Telehealth: Payer: Self-pay | Admitting: *Deleted

## 2019-04-01 ENCOUNTER — Telehealth: Payer: Self-pay | Admitting: Licensed Clinical Social Worker

## 2019-04-01 DIAGNOSIS — K589 Irritable bowel syndrome without diarrhea: Secondary | ICD-10-CM

## 2019-04-01 DIAGNOSIS — U071 COVID-19: Secondary | ICD-10-CM

## 2019-04-01 DIAGNOSIS — R5383 Other fatigue: Secondary | ICD-10-CM

## 2019-04-01 DIAGNOSIS — R634 Abnormal weight loss: Secondary | ICD-10-CM

## 2019-04-01 LAB — POCT URINALYSIS DIPSTICK
Bilirubin, UA: NEGATIVE
Blood, UA: NEGATIVE
Glucose, UA: NEGATIVE
Ketones, UA: NEGATIVE
Leukocytes, UA: NEGATIVE
Nitrite, UA: NEGATIVE
Protein, UA: NEGATIVE
Spec Grav, UA: 1.01 (ref 1.010–1.025)
Urobilinogen, UA: 0.2 E.U./dL
pH, UA: 5.5 (ref 5.0–8.0)

## 2019-04-01 LAB — POCT GLYCOSYLATED HEMOGLOBIN (HGB A1C): Hemoglobin A1C: 5.3 % (ref 4.0–5.6)

## 2019-04-01 LAB — GLUCOSE, CAPILLARY: Glucose-Capillary: 91 mg/dL (ref 70–99)

## 2019-04-01 MED ORDER — DICYCLOMINE HCL 10 MG PO CAPS: 10.0000 mg | ORAL_CAPSULE | Freq: Three times a day (TID) | ORAL | 0 refills | Status: DC | PRN

## 2019-04-01 NOTE — Assessment & Plan Note (Signed)
Improving. Patient denies fever, chills, cough or SOB. She does report worsening fatigue and decreased appetite. She continues to quarantine at home.   - Follow up if symptoms worsen

## 2019-04-01 NOTE — Telephone Encounter (Signed)
I agree

## 2019-04-01 NOTE — Telephone Encounter (Signed)
Patient was contacted due to a referral from her PCP. Patient agreed to services. Patient will be added to my schedule for 7/15 @ 2:30 for a phone visit.

## 2019-04-01 NOTE — Assessment & Plan Note (Signed)
Patient reports frequent bowel movements that appear to be "string-like". Patient was prescribed bentyl but has not been taking it. Patient was encouraged to take bentyl for abdominal discomfort as needed.  - Continue bentyl 10mg  q8h prn - Follow up with PCP

## 2019-04-01 NOTE — Telephone Encounter (Signed)
Patient called in stating she is just not feeling right post Covid infection. Requesting blood work. Appt made today in [place] at 1:15. Patient informed of 2 week Rx for bentyl and to f/u with Dr. Havery Moros. Contact info given. Patient would still like a call from [place] for [facility]. Hubbard Hartshorn, RN, BSN

## 2019-04-01 NOTE — Assessment & Plan Note (Signed)
Patient with recent hospitalization for SOB found to be COVID+ reports worsening fatigue and decreased appetite over the past week. She has associated nocturia, polyuria and polydipsia with subjective weight loss. She also conveys urinary frequency and urgency but denies any dysuria. Patient has no personal history of DM but has positive family history. HbA1c 5.3 today. U/A negative   -CBC, CMP today

## 2019-04-01 NOTE — Patient Instructions (Addendum)
Aimee Davis,  It was a pleasure seeing you in clinic today. Today we talked about your fatigue and decreased appetite. We checked your lab work and will notify you of any abnormalities. Please continue proper hydration and rest.   Contact us if you have any concerns.  Thank you!

## 2019-04-01 NOTE — Progress Notes (Signed)
   CC: fatigue   HPI:  [name] is a 63 y.o. female w/pmhx as listed below presenting to clinic for fatigue and decreased appetite. She was hospitalized on 6/19 for shortness of breath and was found to be COVID+. Her hospitalization did not require intubation and she was discharged home.She reports fatigue and decreased appetite for the past week. She also has nocturia, polyuria and polydipsia and also reports weight loss during this time. She denies fever, chills, cough or shortness of breath.   Past Medical History:  Diagnosis Date  . Allergy    seasonal, PCN, antidepressants, bentyl  . Arthritis   . Asthma   . Carpal tunnel syndrome on both sides    right worse than left 2008  . COPD (chronic obstructive pulmonary disease) ([place])   . GERD (gastroesophageal reflux disease)   . Hyperlipidemia    LDL 108 JULY 2013  . IBS (irritable bowel syndrome)   . Multiple nevi 03/01/2015  . Pneumonia   . Positive TB test    From MArch 2009 note : Recent F/u at [facility]. CXRAy negative.  Positive TST in 2007 (55mm).  Unable to tolerate INH due to hepatotoxicity   Review of Systems:  Review of Systems  Constitutional: Positive for malaise/fatigue and weight loss. Negative for chills and fever.  HENT: Negative for congestion and sore throat.   Eyes: Negative for blurred vision and double vision.  Respiratory: Negative for cough and shortness of breath.   Cardiovascular: Negative for chest pain, palpitations and leg swelling.  Gastrointestinal: Negative for abdominal pain, constipation, diarrhea, nausea and vomiting.  Genitourinary: Positive for frequency and urgency. Negative for dysuria and hematuria.  Musculoskeletal: Negative for joint pain and myalgias.  Neurological: Negative for dizziness, sensory change, focal weakness and headaches.  Psychiatric/Behavioral: Negative for depression.     Physical Exam:  There were no vitals filed for this visit. Physical Exam  Constitutional:  She is oriented to person, place, and time and well-developed, well-nourished, and in no distress.  HENT:  Head: Normocephalic and atraumatic.  Mouth/Throat: Oropharynx is clear and moist.  Eyes: Conjunctivae are normal.  Neck: Normal range of motion.  Cardiovascular: Normal rate, regular rhythm, normal heart sounds and intact distal pulses. Exam reveals no gallop and no friction rub.  No murmur heard. Pulmonary/Chest: Effort normal and breath sounds normal. No respiratory distress. She has no wheezes. She has no rales.  Abdominal: Soft. Bowel sounds are normal. She exhibits no distension. There is no abdominal tenderness.  Musculoskeletal: Normal range of motion.  Neurological: She is alert and oriented to person, place, and time.  Skin: Skin is warm and dry.     Assessment & Plan:   See Encounters Tab for problem based charting.  Patient seen with Dr. Angelia Mould

## 2019-04-02 ENCOUNTER — Ambulatory Visit (INDEPENDENT_AMBULATORY_CARE_PROVIDER_SITE_OTHER): Payer: Self-pay | Admitting: Licensed Clinical Social Worker

## 2019-04-02 ENCOUNTER — Encounter: Payer: Self-pay | Admitting: Licensed Clinical Social Worker

## 2019-04-02 DIAGNOSIS — F4320 Adjustment disorder, unspecified: Secondary | ICD-10-CM

## 2019-04-02 LAB — CBC WITH DIFFERENTIAL/PLATELET
Basophils Absolute: 0 10*3/uL (ref 0.0–0.2)
Basos: 0 %
EOS (ABSOLUTE): 0 10*3/uL (ref 0.0–0.4)
Eos: 0 %
Hematocrit: 36.4 % (ref 34.0–46.6)
Hemoglobin: 12 g/dL (ref 11.1–15.9)
Immature Grans (Abs): 0 10*3/uL (ref 0.0–0.1)
Immature Granulocytes: 0 %
Lymphocytes Absolute: 0.5 10*3/uL — ABNORMAL LOW (ref 0.7–3.1)
Lymphs: 15 %
MCH: 28.3 pg (ref 26.6–33.0)
MCHC: 33 g/dL (ref 31.5–35.7)
MCV: 86 fL (ref 79–97)
Monocytes Absolute: 0.4 10*3/uL (ref 0.1–0.9)
Monocytes: 13 %
Neutrophils Absolute: 2.1 10*3/uL (ref 1.4–7.0)
Neutrophils: 72 %
Platelets: 275 10*3/uL (ref 150–450)
RBC: 4.24 x10E6/uL (ref 3.77–5.28)
RDW: 14.9 % (ref 11.7–15.4)
WBC: 3 10*3/uL — ABNORMAL LOW (ref 3.4–10.8)

## 2019-04-02 LAB — CMP14 + ANION GAP
ALT: 16 IU/L (ref 0–32)
AST: 17 IU/L (ref 0–40)
Albumin/Globulin Ratio: 2.4 — ABNORMAL HIGH (ref 1.2–2.2)
Albumin: 4.3 g/dL (ref 3.8–4.8)
Alkaline Phosphatase: 68 IU/L (ref 39–117)
Anion Gap: 18 mmol/L (ref 10.0–18.0)
BUN/Creatinine Ratio: 19 (ref 12–28)
BUN: 16 mg/dL (ref 8–27)
Bilirubin Total: 0.3 mg/dL (ref 0.0–1.2)
CO2: 22 mmol/L (ref 20–29)
Calcium: 10.2 mg/dL (ref 8.7–10.3)
Chloride: 98 mmol/L (ref 96–106)
Creatinine, Ser: 0.83 mg/dL (ref 0.57–1.00)
GFR calc Af Amer: 86 mL/min/{1.73_m2} (ref >59)
GFR calc non Af Amer: 75 mL/min/{1.73_m2} (ref >59)
Globulin, Total: 1.8 g/dL (ref 1.5–4.5)
Glucose: 94 mg/dL (ref 65–99)
Potassium: 4.9 mmol/L (ref 3.5–5.2)
Sodium: 138 mmol/L (ref 134–144)
Total Protein: 6.1 g/dL (ref 6.0–8.5)

## 2019-04-02 NOTE — BH Specialist Note (Signed)
Integrated Behavioral Health Visit via Telemedicine (Telephone)  04/02/2019 Aimee Davis [id number]   Session Start time: 2:35  Session End time: 3:05 Total time: 30 minutes  Referring Provider: Dr. Dareen Piano Type of Visit: Telephonic Patient location: Home [facility] Provider location: Remote All persons participating in visit: [facility], and patient  Confirmed patient's address: Yes  Confirmed patient's phone number: Yes  Any changes to demographics: No   Discussed confidentiality: Yes    The following statements were read to the patient and/or legal guardian that are established with the [facility] Provider.  "The purpose of this phone visit is to provide behavioral health care while limiting exposure to the coronavirus (COVID19).  There is a possibility of technology failure and discussed alternative modes of communication if that failure occurs."  "By engaging in this telephone visit, you consent to the provision of healthcare.  Additionally, you authorize for your insurance to be billed for the services provided during this telephone visit."   Patient and/or legal guardian consented to telephone visit: Yes   PRESENTING CONCERNS: Patient and/or family reports the following symptoms/concerns: physical weakness due to recovering from the Coronavirus, and sadness.  Duration of problem: since June; Severity of problem: mild  STRENGTHS (Protective Factors/Coping Skills): Prayer  GOALS ADDRESSED: Patient will: 1.  Reduce symptoms of: anxiety, depression and stress  2.  Increase knowledge and/or ability of: coping skills and stress reduction  3.  Demonstrate ability to: Increase healthy adjustment to current life circumstances, Increase adequate support systems for patient/family and adjustment to having the Coronavirus.   INTERVENTIONS: Interventions utilized:  Brief CBT and Supportive Counseling Standardized Assessments completed: Not Needed  ASSESSMENT: Patient currently  experiencing mild sadness and weakness due to having the Coronavirus.  Patient processed her feelings related to having the Coronavirus, and how it negatively impacted her life. Patient has lost weight due to having no appetite, and continues to feel very fatigue. Patient reported anytime she hears about the virus she starts crying. Patient reported the tears come to her because she is fearful of her family contracting it.  Patient reported she felt very alone during the process of the quarantine. Patient has no reported history of depression or anxiety, but now has minor symptoms due to adjusting to contracting the Coronavirus. Patient is trying to leave her home, and follow safety procedures.   Patient reported that she wished there was a support group for people whom also had/or currently have the Coronavirus.   Patient may benefit from bi-weekly outpatient therapy.  PLAN: 1. Follow up with behavioral health clinician on : two weeks via phone.   Aimee Davis, [facility], [place]

## 2019-04-02 NOTE — Progress Notes (Signed)
Internal Medicine Clinic Attending  I saw and evaluated the patient.  I personally confirmed the key portions of the history and exam documented by Dr. Marva Panda and I reviewed pertinent patient test results.  The assessment, diagnosis, and plan were formulated together and I agree with the documentation in the resident's note.    Discussed that I suspect her fatigue is the sequela of the COVID-19 infection, labwork overall looks good, she remains sightly lympopenic however given she is 5 weeks out from diagnosis I do not suspect she is infectious.  Advised outdoor exercise and expect for symptoms to improve over about 3 months.

## 2019-04-03 ENCOUNTER — Telehealth: Payer: Self-pay | Admitting: Internal Medicine

## 2019-04-03 ENCOUNTER — Encounter: Payer: Self-pay | Admitting: Internal Medicine

## 2019-04-03 NOTE — Telephone Encounter (Signed)
Pt calling checking on lab results [phone]

## 2019-04-03 NOTE — Telephone Encounter (Signed)
Patient called and informed of lab results. She wants to have copy of results sent to her.

## 2019-04-03 NOTE — Progress Notes (Signed)
I spoke with the patient regarding her lab results and informed her that they were normal and reassuring. Patient reports that she has been feeling down with some associated confusion lately, especially in the morning. She reports that this has only happened a few times and goes away within an hour. She reports that she usually prays and is very tearful during this time. Symptoms likely due to adjustment disorder for which she had telehealth visit with Dessie Coma yesterday. I recommended continuation of visits with Miquel Dunn and online support groups for COVID survivors.  Patient also wanted paper copy of lab results. Will print and mail to her home.

## 2019-04-03 NOTE — Telephone Encounter (Signed)
Copy of labs placed in Out Mail box.

## 2019-04-08 ENCOUNTER — Telehealth: Payer: Self-pay

## 2019-04-08 NOTE — Telephone Encounter (Signed)
Requesting to speak with a nurse about something. Please call back.  

## 2019-04-08 NOTE — Telephone Encounter (Signed)
Lm for rtc 

## 2019-04-13 ENCOUNTER — Telehealth: Payer: Self-pay | Admitting: Internal Medicine

## 2019-04-13 NOTE — Telephone Encounter (Signed)
Return pt's call - no answer; left message to call the office if assistance is needed.

## 2019-04-13 NOTE — Telephone Encounter (Signed)
Pt states she is having more confusion. Pt having a very scary feeling.  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 Unable to concentrate on things after having COVID-19 and would like a nurse to call her back.

## 2019-04-16 ENCOUNTER — Ambulatory Visit: Payer: Self-pay | Admitting: Licensed Clinical Social Worker

## 2019-04-16 ENCOUNTER — Telehealth: Payer: Self-pay | Admitting: Licensed Clinical Social Worker

## 2019-04-16 NOTE — Telephone Encounter (Signed)
Patient was contacted for our scheduled appointment. Patient did not answer, and a vm was left for the patient to contact our office if she would like to reschedule.

## 2019-04-16 NOTE — Telephone Encounter (Signed)
Pt walked in Stated she had appt today, only see televisit w/ Aimee Davis. Pt states it is with her new md Made appt for tues 8/4 dr Benjamine Mola She is very anxious

## 2019-04-21 ENCOUNTER — Encounter: Payer: Self-pay | Admitting: Internal Medicine

## 2019-04-21 ENCOUNTER — Ambulatory Visit (INDEPENDENT_AMBULATORY_CARE_PROVIDER_SITE_OTHER): Payer: Self-pay | Admitting: Internal Medicine

## 2019-04-21 ENCOUNTER — Other Ambulatory Visit: Payer: Self-pay

## 2019-04-21 VITALS — BP 109/74 | HR 100 | Temp 98.1°F | Ht 63.5 in | Wt 144.4 lb

## 2019-04-21 DIAGNOSIS — F5101 Primary insomnia: Secondary | ICD-10-CM

## 2019-04-21 DIAGNOSIS — L988 Other specified disorders of the skin and subcutaneous tissue: Secondary | ICD-10-CM

## 2019-04-21 DIAGNOSIS — L821 Other seborrheic keratosis: Secondary | ICD-10-CM

## 2019-04-21 DIAGNOSIS — J4541 Moderate persistent asthma with (acute) exacerbation: Secondary | ICD-10-CM

## 2019-04-21 DIAGNOSIS — U071 COVID-19: Secondary | ICD-10-CM

## 2019-04-21 HISTORY — DX: Other specified disorders of the skin and subcutaneous tissue: L98.8

## 2019-04-21 NOTE — Assessment & Plan Note (Addendum)
Patient was previously experiencing adjustment disorder with anxiety related to prior COVID-19 infection.  She states that she was having difficulty concentrating and experiencing racing thoughts.  She states that she is doing much better now.  She does have continued anxiety and frustrations related to COVID-19.  Patient states that she gets very frustrated with people who are not wearing masks and is upset that her church does not require people to wear masks.  She feels uncomfortable wearing her mask while at church and is worried about people spreading COVID.  Patient requests additional session with behavioral health to talk about how to cope with this anxiety.  Patient is also experiencing shortness of breath which may be 2/2 asthma but complicated by recent [id number] infection. Please see assessment/plan under Asthma for further discussion on this.

## 2019-04-21 NOTE — Assessment & Plan Note (Signed)
Patient reports continued shortness of breath since COVID-19 infection.  Reports shortness of breath with exertion after walking short distances and occasionally at rest.  Patient denies waking up with shortness of breath.  Patient reports taking her albuterol inhaler approximately every other day.  Patient's lungs were clear without wheezing on exam. Patient walked around clinic with O2 monitoring and patient maintained stauration to 97-98%.  Advised patient to continue activity as tolerated and we will monitor for improvement in shortness of breath which may be secondary to her COVID-19 infection.  No indication for increasing asthma regimen at this time.

## 2019-04-21 NOTE — Assessment & Plan Note (Signed)
Patient reports insomnia which started prior to the anxiety from COVID-19 infection.  Patient reports taking ramelteon approximately 30 minutes prior to going to sleep.  She avoids screens while in bed and will get up if she is not able to fall asleep after a short while.  Patient has previously tried zolpidem but did not like the medication due to confusion.  It was considered to have her try medication at decreased dose but did not want to continue medication due to side effects.  Today, patient was counseled on appropriate sleep hygiene.  She was advised to try taking ramelteon earlier, approximately 2 hours before she attempts to go to sleep and to avoid any screens during this time.  Patient was comfortable with plan and does not desire any additional pharmacologic management due to concern about side effects.  Patient's insomnia may be worsened by anxiety related to COVID-19 infection.  Patient was amenable to speaking with Dessie Coma health for another session.

## 2019-04-21 NOTE — Patient Instructions (Addendum)
Today we talked about your insomnia, shortness of breath, skin changes, and recent challenges after your COVID infection.   For your insomnia, I recommend that you try taking your ramelteon a little earlier. If you plan on going to bed at 10:30pm, try taking it at about 8:30pm to give the medication more time to have an effect. Also, try to avoid screens during this time, including your phone and television. If you are unable to fall asleep for about 20-30 minutes, try getting up for a short while and then go back to bed to try again.  For your shortness of breath, your lungs may be still recovering from your COVID19 infection. I do not want to increase your asthma medications right now as I think your shortness of breath may improve with time. Continue to perform activity as your body tolerates it.  Regarding your skin changes, the one in your groin region is called seborrheic keratosis. We do not need to remove these unless they are bothering you. There were a couple new growths on your leg that we took a picture of. We will just follow those and see if there are any changes in their size.  Regarding the emotional difficulties you have faced after your COVID19 infection, I appreciate you sharing these challenges. These are difficult things that you have had to deal with and I think you could benefit from an additional session with Dessie Coma. I will reach out to have her schedule an appointment with you.  Thank you for allowing me to be a part of your care

## 2019-04-21 NOTE — Progress Notes (Signed)
   CC: Shortness of breath  HPI: Aimee Davis is a 63 year old female with past medical history of asthma, GERD, IBS who presents to clinic with questions about shortness of breath, skin changes, insomnia.  Patient history significant for a COVID-19 infection on 02/24/2019 and she was subsequently admitted to the hospital on 03/02/2019 for cough and shortness of breath and treated for an asthma exacerbation with IV steroids and sent home on a steroid taper.  Following recovery, patient was seen by Dessie Coma on 04/02/2019 for adjustment disorder with anxiety and received brief CBT and supportive counseling.    Please see problem based assessment and plan for problem related history.  Aimee Davis is a 63 y.o.   Past Medical History:  Diagnosis Date  . Allergy    seasonal, PCN, antidepressants, bentyl  . Arthritis   . Asthma   . Carpal tunnel syndrome on both sides    right worse than left 2008  . COPD (chronic obstructive pulmonary disease) ([place])   . GERD (gastroesophageal reflux disease)   . Hyperlipidemia    LDL 108 JULY 2013  . IBS (irritable bowel syndrome)   . Multiple nevi 03/01/2015  . Pneumonia   . Positive TB test    From MArch 2009 note : Recent F/u at [facility]. CXRAy negative.  Positive TST in 2007 (41mm).  Unable to tolerate INH due to hepatotoxicity   Review of Systems:   Review of Systems  Constitutional: Negative for chills and fever.  Respiratory: Positive for shortness of breath.   Cardiovascular: Negative for chest pain.  Gastrointestinal: Negative for nausea and vomiting.    Physical Exam:  Vitals:   04/21/19 1316 04/21/19 1431  BP: 109/74   Pulse: 100   Temp: 98.1 F (36.7 C)   TempSrc: Oral   SpO2: 99% 97%  Weight: 144 lb 6.4 oz (65.5 kg)   Height: 5' 3.5" (1.613 m)    Physical Exam  HENT:  Head: Normocephalic and atraumatic.  Cardiovascular: Normal rate. Exam reveals no gallop and no friction rub.  No murmur heard. Pulmonary/Chest: Effort normal  and breath sounds normal. She has no wheezes. She has no rales.  Abdominal: Soft. Bowel sounds are normal. There is no abdominal tenderness.  Musculoskeletal: Normal range of motion.        General: No tenderness or edema.  Neurological: She is alert. Coordination normal.  Skin: Skin is warm and dry.  Approximately 1.5 cm waxy, scaly plaque in right inguinal region  Multiple macules on left leg (see photo) with fried egg appearance       Assessment & Plan:   See Encounters Tab for problem based charting.  Patient discussed with Dr. Evette Doffing

## 2019-04-22 ENCOUNTER — Encounter: Payer: Self-pay | Admitting: Internal Medicine

## 2019-04-22 NOTE — Assessment & Plan Note (Signed)
Patient has multiple skin macules on her left leg the largest measuring 1 cm.  One has a fried egg appearance consistent with a dysplastic nevus. These are low risk for melanoma. We will continue to follow for progression.

## 2019-04-22 NOTE — Progress Notes (Signed)
Internal Medicine Clinic Attending  I saw and evaluated the patient.  I personally confirmed the key portions of the history and exam documented by Dr. Benjamine Mola and I reviewed pertinent patient test results.  The assessment, diagnosis, and plan were formulated together and I agree with the documentation in the resident's note.  Patient has several macules on her left leg, one appears to be a dysplastic nevi with the typical "fried egg" appearance. We will monitor for 3 months and see if it has any changes before offering biopsy.

## 2019-04-28 ENCOUNTER — Telehealth: Payer: Self-pay | Admitting: Licensed Clinical Social Worker

## 2019-04-28 NOTE — Telephone Encounter (Signed)
Patient was contacted to have her added back to my schedule after missing her previous appointment. Patient will be added to my schedule on 8/19 @ 10:00 via phone.

## 2019-05-06 ENCOUNTER — Encounter: Payer: Self-pay | Admitting: Licensed Clinical Social Worker

## 2019-05-06 ENCOUNTER — Other Ambulatory Visit: Payer: Self-pay

## 2019-05-06 ENCOUNTER — Ambulatory Visit (INDEPENDENT_AMBULATORY_CARE_PROVIDER_SITE_OTHER): Payer: Self-pay | Admitting: Licensed Clinical Social Worker

## 2019-05-06 DIAGNOSIS — F4320 Adjustment disorder, unspecified: Secondary | ICD-10-CM

## 2019-05-06 NOTE — BH Specialist Note (Signed)
Integrated Behavioral Health Visit via Telemedicine (Telephone)  05/06/2019 Aimee Davis [id number]   Session Start time: 10:00  Session End time: 10:30 Total time: 30 minutes  Referring Provider: Dr. Dareen Piano Type of Visit: Telephonic Patient location: Home [facility] Provider location: Office All persons participating in visit: patient and [facility]  Confirmed patient's address: Yes  Confirmed patient's phone number: Yes  Any changes to demographics: No   Discussed confidentiality: Yes    The following statements were read to the patient and/or legal guardian that are established with the [facility] Provider.  "The purpose of this phone visit is to provide behavioral health care while limiting exposure to the coronavirus (COVID19).  There is a possibility of technology failure and discussed alternative modes of communication if that failure occurs."  "By engaging in this telephone visit, you consent to the provision of healthcare.  Additionally, you authorize for your insurance to be billed for the services provided during this telephone visit."   Patient and/or legal guardian consented to telephone visit: Yes   PRESENTING CONCERNS: Patient and/or family reports the following symptoms/concerns: sadness, anxious, and adjustment to recovering from the Coronavirus.  Duration of problem: since June; Severity of problem: mild  STRENGTHS (Protective Factors/Coping Skills): Prayer  GOALS ADDRESSED: Patient will: 1.  Reduce symptoms of: anxiety, depression and stress  2.  Increase knowledge and/or ability of: coping skills, healthy habits and stress reduction  3.  Demonstrate ability to: Increase healthy adjustment to current life circumstances and Increase adequate support systems for patient/family  INTERVENTIONS: Interventions utilized:  Motivational Interviewing, Brief CBT and Supportive Counseling Standardized Assessments completed: Not Needed  ASSESSMENT: Patient currently  experiencing improved mood. Patient reported that she gets out of breath very easily, and this concerns her.  Patient processed as the weather changes she has challenges breathing. Patient denies feeling anxious when this happens. She is trying to remain calm, sit down, and catch her breath.   Patient processed how she shared with a cousin that she had contracted Covid. Patient's cousin shared this information with other family members. This caused frustration for the patient. Patient reported she feels very alone at this time. Patient processed the judgement she is receiving from others due to people being fearful of her having the Coronavirus in the past.   Patient may benefit from outpatient counseling.  PLAN: 1. Follow up with behavioral health clinician on : two weeks.  Dessie Coma, [facility], [place]

## 2019-05-08 ENCOUNTER — Ambulatory Visit: Payer: Self-pay | Admitting: Gastroenterology

## 2019-05-19 ENCOUNTER — Telehealth: Payer: Self-pay

## 2019-05-19 NOTE — Telephone Encounter (Signed)
Pt states she is having rash on the body, throat feeling sore and painful. Please call pt back.

## 2019-05-19 NOTE — Telephone Encounter (Signed)
Spoke w/ pt, ongoing for appr 1 month, appt [facility] 9/2 at [facility], pt will be in clinic tomorrow am- she has appt w/ you at 1000

## 2019-05-20 ENCOUNTER — Other Ambulatory Visit: Payer: Self-pay

## 2019-05-20 ENCOUNTER — Ambulatory Visit (INDEPENDENT_AMBULATORY_CARE_PROVIDER_SITE_OTHER): Payer: Self-pay | Admitting: Internal Medicine

## 2019-05-20 ENCOUNTER — Ambulatory Visit (INDEPENDENT_AMBULATORY_CARE_PROVIDER_SITE_OTHER): Payer: Self-pay | Admitting: Licensed Clinical Social Worker

## 2019-05-20 ENCOUNTER — Encounter: Payer: Self-pay | Admitting: Internal Medicine

## 2019-05-20 VITALS — BP 108/72 | HR 64 | Temp 97.6°F | Ht 63.5 in | Wt 145.1 lb

## 2019-05-20 DIAGNOSIS — R21 Rash and other nonspecific skin eruption: Secondary | ICD-10-CM

## 2019-05-20 DIAGNOSIS — Z87891 Personal history of nicotine dependence: Secondary | ICD-10-CM

## 2019-05-20 DIAGNOSIS — B360 Pityriasis versicolor: Secondary | ICD-10-CM

## 2019-05-20 DIAGNOSIS — Z90711 Acquired absence of uterus with remaining cervical stump: Secondary | ICD-10-CM

## 2019-05-20 DIAGNOSIS — M542 Cervicalgia: Secondary | ICD-10-CM | POA: Insufficient documentation

## 2019-05-20 DIAGNOSIS — F4320 Adjustment disorder, unspecified: Secondary | ICD-10-CM

## 2019-05-20 HISTORY — DX: Rash and other nonspecific skin eruption: R21

## 2019-05-20 MED ORDER — KETOCONAZOLE 2 % EX CREA: 1.0000 "application " | TOPICAL_CREAM | Freq: Every day | CUTANEOUS | 0 refills | Status: DC

## 2019-05-20 MED FILL — KETOCONAZOLE 2% CREAM: 2 | 10 days supply | Qty: 30 | Fill #0

## 2019-05-20 NOTE — Assessment & Plan Note (Signed)
Rash has been there for about 3 weeks. Rash has improved in certain areas but seemed to move to other places.  Today concentration in areas with more sweat and moisture under arms, waist band.  It is pruritic but not painful.  She does not recall having a rash like this before.  It is non erythematous.  No systemic symptoms.  No new exposures or detergents.  She has not been in any pools this summer.  ddx includes tinea versicolor vs atopic dermatitis given her history of asthma although the placement and appearance is not typical.     -will start with ketoconazole cream 2% -return precautions if no improvement may need to consider this could be an atypical presentation of eczema

## 2019-05-20 NOTE — Assessment & Plan Note (Signed)
Started Friday of last week.  No trauma associated heaviest thing she has lifted has been groceries, may have had some pain putting up dishes in high cabinet.  Was very painful but has improved.  Hurts with palpation, rotation of neck or extension.  No pain with swallowing.  No thyroid enlargement or lymphadenopathy.  Tender along medial border of mid SCM.  Maintains normal range of motion. Examination consistent with a muscular strain type of pain with no concerning features.    -OTC NSAIDS -Heating pad

## 2019-05-20 NOTE — BH Specialist Note (Signed)
Integrated Behavioral Health Follow Up Visit  MRN: [id number] Name: Aimee Davis  Number of [place] Clinician visits: 3/6 Session Start time: 10:05  Session End time: 10:40 Total time: 35 minutes  Type of Service: Integrated Behavioral Health- Individual Interpretor:No.   SUBJECTIVE: Aimee Davis is a 63 y.o. female  whom attended the session individually.  Patient was referred by Dr. Dareen Piano for anxiety . Patient reports the following symptoms/concerns: sadness, anxiety, health concerns, and interpersonal issues.  Duration of problem: since June; Severity of problem: mild  OBJECTIVE: Mood: Netural and Affect: Appropriate Risk of harm to self or others: No plan to harm self or others  GOALS ADDRESSED: Patient will: 1.  Reduce symptoms of: anxiety, insomnia, stress and sadness.  2.  Increase knowledge and/or ability of: coping skills, healthy habits and stress reduction  3.  Demonstrate ability to: Increase healthy adjustment to current life circumstances and Increase adequate support systems for patient/family  INTERVENTIONS: Interventions utilized:  Motivational Interviewing, Behavioral Activation, Brief CBT and Supportive Counseling Standardized Assessments completed: Not Needed  ASSESSMENT: Patient currently experiencing mild anxiety and sadness. Patient reported an improvement with her overall mood over the past week. Patient reported she has been waking up in the morning feeling blessed, which is a positive change. Patient reported her ability to think clearly is improving since contracting the Coronavirus. Patient is concerned over her mother's health, and is spending increased time caring for her. Patient reported anxiety/uncertainty regarding her son's life choices.   Patient processed the end of a 17-year friendship, and is grieving the loss of this friendship. Patient identified ways that she could try to reach closure with this friendship, so  that she can move forward. Overall, the patient's mood and anxiety levels are continuing to improve.   Patient may benefit from counseling.  PLAN: 1. Follow up with behavioral health clinician on : one week.   Dessie Coma, [facility]

## 2019-05-20 NOTE — Progress Notes (Signed)
CC: neck pain, rash  HPI:  [name] is a 63 y.o. female with PMH below.  Today we will address neck pain, rash   Please see A&P for status of the patient's chronic medical conditions  Past Medical History:  Diagnosis Date  . Allergy    seasonal, PCN, antidepressants, bentyl  . Arthritis   . Asthma   . Carpal tunnel syndrome on both sides    right worse than left 2008  . Cataract 06/11/2013  . COPD (chronic obstructive pulmonary disease) ([place])   . COVID-19 virus infection 06/18/2016  . GERD (gastroesophageal reflux disease)   . Hyperlipidemia    LDL 108 JULY 2013  . IBS (irritable bowel syndrome)   . Multiple nevi 03/01/2015  . Pneumonia   . Positive TB test    From MArch 2009 note : Recent F/u at [facility]. CXRAy negative.  Positive TST in 2007 (58mm).  Unable to tolerate INH due to hepatotoxicity   Review of Systems:  ROS: Pulmonary: pt denies increased work of breathing, shortness of breath,  Cardiac: pt denies palpitations, chest pain,  Abdominal: pt denies abdominal pain, nausea, vomiting, or diarrhea  Physical Exam:  Vitals:   05/20/19 0901  BP: 108/72  Pulse: 64  Temp: 97.6 F (36.4 C)  TempSrc: Oral  SpO2: 100%  Weight: 145 lb 1.6 oz (65.8 kg)  Height: 5' 3.5" (1.613 m)   Cardiac: JVD flat, normal rate and rhythm, clear s1 and s2, no murmurs, rubs or gallops, no LE edema Pulmonary: CTAB, not in distress Neck: No thyroid enlargement or lymphadenopathy.  Tender along medial border of right sided mid SCM.  Maintains normal range of motion Hurts with palpation, rotation of neck or extension Skin: macular scaly rash with concentration under arms, waistline, one small area in lower left paraspinal area.   Media Information    Document Information  Photos    05/20/2019 09:24  Attached To:  Office Visit on 05/20/19 with Aimee Roan, MD  Source Information  Aimee Roan, MD  [facility]   Media Information    Document  Information  Photos    05/20/2019 09:24  Attached To:  Office Visit on 05/20/19 with Aimee Roan, MD  Source Information  Aimee Davis, Aimee Pane, MD  [facility]    Social History   Socioeconomic History  . Marital status: Divorced    Spouse name: Not on file  . Number of children: Not on file  . Years of education: Not on file  . Highest education level: Not on file  Occupational History  . Not on file  Social Needs  . Financial resource strain: Not on file  . Food insecurity    Worry: Not on file    Inability: Not on file  . Transportation needs    Medical: Not on file    Non-medical: Not on file  Tobacco Use  . Smoking status: Former Smoker    Packs/day: 0.33    Years: 43.00    Pack years: 14.19    Types: Cigarettes    Quit date: 01/06/2013    Years since quitting: 6.3  . Smokeless tobacco: Never Used  Substance and Sexual Activity  . Alcohol use: No    Alcohol/week: 0.0 standard drinks  . Drug use: Not Currently    Comment: x 26 yrs.  . Sexual activity: Not on file    Comment: S/P PARTIAL HYSTERECTOMY  Lifestyle  . Physical activity  Days per week: Not on file    Minutes per session: Not on file  . Stress: Not on file  Relationships  . Social Herbalist on phone: Not on file    Gets together: Not on file    Attends religious service: Not on file    Active member of club or organization: Not on file    Attends meetings of clubs or organizations: Not on file    Relationship status: Not on file  . Intimate partner violence    Fear of current or ex partner: Not on file    Emotionally abused: Not on file    Physically abused: Not on file    Forced sexual activity: Not on file  Other Topics Concern  . Not on file  Social History Narrative  . Not on file    Family History  Problem Relation Age of Onset  . Hypertension Mother   . Hyperlipidemia Mother   . Heart disease Mother   . Diabetes Mother   . Kidney disease Mother    . Stroke Father   . Diabetes Father   . Heart disease Father   . Hyperlipidemia Father   . Hypertension Father   . Kidney disease Father   . Cancer Sister        breast  . Mental illness Sister   . Diabetes Sister   . Kidney disease Sister   . Hypertension Sister   . Mental illness Brother   . Asthma Son   . Hypertension Maternal Aunt   . Hypertension Maternal Uncle   . Hypertension Paternal Aunt   . Kidney disease Paternal Aunt   . Hypertension Paternal Uncle   . Kidney disease Paternal Uncle     Assessment & Plan:   See Encounters Tab for problem based charting.  Patient discussed with Dr. Rebeca Davis

## 2019-05-20 NOTE — Patient Instructions (Signed)
Ms. Dennler we have prescribed you a ketoconazole cream for your rash.  For your neck you can do a heating pad and as needed ibuprofen.  Please let us know how the rash is doing the next few weeks.

## 2019-05-26 NOTE — Progress Notes (Signed)
Internal Medicine Clinic Attending  Case discussed with Dr. Winfrey at the time of the visit.  We reviewed the resident's history and exam and pertinent patient test results.  I agree with the assessment, diagnosis, and plan of care documented in the resident's note.  Alexander Raines, M.D., Ph.D.  

## 2019-05-28 ENCOUNTER — Encounter: Payer: Self-pay | Admitting: Licensed Clinical Social Worker

## 2019-05-28 ENCOUNTER — Other Ambulatory Visit: Payer: Self-pay

## 2019-05-28 ENCOUNTER — Ambulatory Visit (INDEPENDENT_AMBULATORY_CARE_PROVIDER_SITE_OTHER): Payer: Self-pay | Admitting: Licensed Clinical Social Worker

## 2019-05-28 DIAGNOSIS — F4320 Adjustment disorder, unspecified: Secondary | ICD-10-CM

## 2019-05-28 NOTE — BH Specialist Note (Signed)
Integrated Behavioral Health Visit via Telemedicine (Telephone)  05/28/2019 Aimee Davis [id number]   Session Start time: 11:07  Session End time: 11:30 Total time: 23 minutes  Referring Provider: Dr. Dareen Piano Type of Visit: Telephonic Patient location: Home [facility] Provider location: Office All persons participating in visit: patient and [facility]  Confirmed patient's address: Yes  Confirmed patient's phone number: Yes  Any changes to demographics: No   Discussed confidentiality: Yes    The following statements were read to the patient and/or legal guardian that are established with the [facility] Provider.  "The purpose of this phone visit is to provide behavioral health care while limiting exposure to the coronavirus (COVID19).  There is a possibility of technology failure and discussed alternative modes of communication if that failure occurs."  "By engaging in this telephone visit, you consent to the provision of healthcare.  Additionally, you authorize for your insurance to be billed for the services provided during this telephone visit."   Patient and/or legal guardian consented to telephone visit: Yes   PRESENTING CONCERNS: Patient and/or family reports the following symptoms/concerns: sadness, anxious, and adjustment to recovering from the Coronavirus. Duration of problem: since June; Severity of problem: mild  GOALS ADDRESSED: Patient will: 1.  Reduce symptoms of: anxiety, depression and stress  2.  Increase knowledge and/or ability of: coping skills, healthy habits and stress reduction  3.  Demonstrate ability to: Increase healthy adjustment to current life circumstances, Increase adequate support systems for patient/family and Begin healthy grieving over loss  INTERVENTIONS: Interventions utilized:  Mindfulness or Relaxation Training, Brief CBT and Supportive Counseling Standardized Assessments completed: Not Needed  ASSESSMENT: Patient currently experiencing  anxiety. Patient's aunt passed since our last session. Patient is coping with grief in a positive way. Patient recently went on vacation, and reported it was very crowded. Patient reported having anxiety and anger while on vacation due to fear of re-contracting the Coronavirus. Patient continued to have anxiety even after coming home due to fear of having the virus. Patient is reducing anxiety now due to not having any symptoms. Patient went to [facility], and noticed increased anxiety because of the amount of people in the store. Patient identified that she can go to the store during senior citizen hours to reduce crowds.   Patient is continuing to struggle with her friendship ending with her long-term friend. Patient is contemplating how to attempt to mend this friendship. Patient is struggling to interact with others, and reported she is shutting down towards others.   Patient may benefit from counseling.  PLAN: 1. Follow up with behavioral health clinician on : three weeks.   Dessie Coma, [facility], [place]

## 2019-06-11 ENCOUNTER — Ambulatory Visit: Payer: Self-pay

## 2019-06-16 ENCOUNTER — Other Ambulatory Visit: Payer: Self-pay

## 2019-06-16 ENCOUNTER — Ambulatory Visit (INDEPENDENT_AMBULATORY_CARE_PROVIDER_SITE_OTHER): Payer: Self-pay | Admitting: *Deleted

## 2019-06-16 DIAGNOSIS — Z23 Encounter for immunization: Secondary | ICD-10-CM

## 2019-06-17 ENCOUNTER — Encounter: Payer: Self-pay | Admitting: Licensed Clinical Social Worker

## 2019-06-17 ENCOUNTER — Ambulatory Visit (INDEPENDENT_AMBULATORY_CARE_PROVIDER_SITE_OTHER): Payer: Self-pay | Admitting: Licensed Clinical Social Worker

## 2019-06-17 DIAGNOSIS — F4320 Adjustment disorder, unspecified: Secondary | ICD-10-CM

## 2019-06-17 NOTE — BH Specialist Note (Signed)
Integrated Behavioral Health Visit via Telemedicine (Telephone)  06/17/2019 ASHAY BARRERE [id number]   Session Start time: 11:00  Session End time: 11:30 Total time: 30 minutes  Referring Provider: Dr. Dareen Piano Type of Visit: Telephonic Patient location: Home [facility] Provider location: Office All persons participating in visit: patient and [facility]  Confirmed patient's address: Yes  Confirmed patient's phone number: Yes  Any changes to demographics: No   Discussed confidentiality: Yes    The following statements were read to the patient and/or legal guardian that are established with the [facility] Provider.  "The purpose of this phone visit is to provide behavioral health care while limiting exposure to the coronavirus (COVID19).  There is a possibility of technology failure and discussed alternative modes of communication if that failure occurs."  "By engaging in this telephone visit, you consent to the provision of healthcare.  Additionally, you authorize for your insurance to be billed for the services provided during this telephone visit."   Patient and/or legal guardian consented to telephone visit: Yes   PRESENTING CONCERNS: Patient and/or family reports the following symptoms/concerns: sadness, lack of motivation, and stress. Duration of problem: since June; Severity of problem: mild  STRENGTHS (Protective Factors/Coping Skills): Walking and praying  GOALS ADDRESSED: Patient will: 1.  Reduce symptoms of: anxiety, depression and stress  2.  Increase knowledge and/or ability of: coping skills, healthy habits and stress reduction  3.  Demonstrate ability to: Increase healthy adjustment to current life circumstances and Increase adequate support systems for patient/family  INTERVENTIONS: Interventions utilized:  Motivational Interviewing, Brief CBT and Supportive Counseling Standardized Assessments completed: Not Needed  ASSESSMENT: Patient currently experiencing  mild depression. Patient was recently sick, and is recovering. Patient is still trying to gain her weight back up to a healthy range. Patient identified that her mood shifts with the seasons, and she is noticing a decline in motivation. Patient agreed to still try to implement activities that she loves even though the seasons are changing. Patient feels the lack of motivation mostly in the morning time, and discussed ways to challenge these negative thought patterns.   Patient may benefit from counseling.  PLAN: 1. Follow up with behavioral health clinician on : two weeks.  Dessie Coma, [facility], [place]

## 2019-06-24 ENCOUNTER — Other Ambulatory Visit: Payer: Self-pay

## 2019-06-24 ENCOUNTER — Encounter: Payer: Self-pay | Admitting: Nurse Practitioner

## 2019-06-24 ENCOUNTER — Ambulatory Visit (INDEPENDENT_AMBULATORY_CARE_PROVIDER_SITE_OTHER): Payer: Self-pay | Admitting: Nurse Practitioner

## 2019-06-24 VITALS — BP 122/68 | HR 72 | Temp 98.1°F | Ht 63.0 in | Wt 144.5 lb

## 2019-06-24 DIAGNOSIS — R1030 Lower abdominal pain, unspecified: Secondary | ICD-10-CM

## 2019-06-24 DIAGNOSIS — K5909 Other constipation: Secondary | ICD-10-CM

## 2019-06-24 MED ORDER — DICYCLOMINE HCL 10 MG PO CAPS: 10.0000 mg | ORAL_CAPSULE | Freq: Three times a day (TID) | ORAL | 0 refills | Status: DC | PRN

## 2019-06-24 NOTE — Progress Notes (Signed)
Agree with assessment and plan as outlined.  

## 2019-06-24 NOTE — Progress Notes (Signed)
Chief Complaint:   Abdominal cramps / spasms  IMPRESSION and PLAN:    61.  63 year old female with intermittent lower abdominal spasm type pain which she relates to consumption of red meat. -Obviously needs to avoid trigger foods. -She has dicyclomine at home to take as needed  2.  Generalized lower abdominal "irritation", mainly noticeable in the mornings. This is really more of a chronic issue for her.  Patient is up-to-date on colonoscopy.  Weight is stable. She says her bowels are moving okay taking Linzess as needed.  No blood in stool or other concerning features.  Etiology unclear at this point.  -I think it is worthwhile for her to try using the Bentyl twice daily over the next couple of weeks and see if it makes any difference.  Cautioned her about worsening constipation so she may need to take Linzess more frequently for the time being.  -I will see her back in a few weeks.  If still having frequent diffuse lower abdominal discomfort then will consider pursuing CT scan.   3. History of colon polyps.  Screening colonoscopy March of this year.  Exam was complete, bowel prep adequate.  A diminutive cecal tubular adenoma was removed.  She will need surveillance colonoscopy in 7-years  HPI:     Patient is a 63 year old female known to Dr. Havery Moros for history of GERD, chronic constipation, and history of colon polyps.   She had a screening colonoscopy 11/26/2018.  Exam was complete with an adequate bowel prep.   Last week patient developed lower abdominal discomfort described as a spasm type pain.  She has had this type pain before and thinks it is related to eating red meat.  When the pain started it occurred intermittently for 4 days.  The episodes were mainly if she tried to eat.  After the initial onset of pain, subsequent episodes were less intense and improved with each day.  She had no associated nausea nor diarrhea.  She has chronic constipation but wasn't constipated at  the time. Takes Linzess as needed, it works well.  She has dicyclomine but did not take until the pain had already been present for a couple of days.  She really cannot say whether the dicyclomine helped at that point or not.  Patient inquires about what foods to avoid to prevent episodes of abdominal pain.  Red meat seems to be the only consistent trigger.    After describing above episode which occurred last week, Aimee Davis then described a more chronic lower abdominal discomfort which occurs mainly in the morning time several times a week.  This discomfort is more of an "irritation" rather than spasm.  It is unrelated to eating as it often occurs in the fasting state.  Discomfort not positional.  No urinary symptoms. She is not losing weight but is concerned about an inability to gain weight.  Based on epic values, her weight is down only 6 pounds since March of this year   Review of systems:     No chest pain, no SOB, no fevers   Past Medical History:  Diagnosis Date  . Allergy    seasonal, PCN, antidepressants, bentyl  . Arthritis   . Asthma   . Carpal tunnel syndrome on both sides    right worse than left 2008  . Cataract 06/11/2013  . COPD (chronic obstructive pulmonary disease) ([place])   . COVID-19 virus infection 06/18/2016  . Eczema 05/02/2018  . GERD (gastroesophageal reflux  disease)   . Hyperlipidemia    LDL 108 JULY 2013  . IBS (irritable bowel syndrome)   . Multiple nevi 03/01/2015  . Pneumonia   . Positive TB test    From MArch 2009 note : Recent F/u at [facility]. CXRAy negative.  Positive TST in 2007 (18mm).  Unable to tolerate INH due to hepatotoxicity    Patient's surgical history, family medical history, social history, medications and allergies were all reviewed in Epic   Creatinine clearance cannot be calculated (Patient's most recent lab result is older than the maximum 21 days allowed.)  Current Outpatient Medications  Medication Sig Dispense Refill  . Artificial Tear  Ointment (DRY EYES OP) Apply 1 drop to eye daily as needed (dry eyes).    . Ascorbic Acid (VITAMIN C) 100 MG tablet Take 50 mg by mouth daily.    . B Complex-C (B-COMPLEX WITH VITAMIN C) tablet Take 1 tablet by mouth daily.    . cetirizine (ZYRTEC) 10 MG tablet Take 10 mg by mouth daily.    Marland Kitchen dicyclomine (BENTYL) 10 MG capsule Take 1 capsule (10 mg total) by mouth every 8 (eight) hours as needed for spasms. 45 capsule 0  . linaclotide (LINZESS) 145 MCG CAPS capsule Take 1 capsule (145 mcg total) by mouth daily before breakfast. 90 capsule 3  . Magnesium Hydroxide (MILK OF MAGNESIA PO) Take by mouth as needed.    . mometasone (NASONEX) 50 MCG/ACT nasal spray PLACE 2 SPRAYS IN EACH NOSTRIL ONCE DAILY AS NEEDED. 17 g 6  . Multiple Vitamin (MULTIVITAMIN) capsule Take 1 capsule by mouth daily.    . pantoprazole (PROTONIX) 40 MG tablet Take 1 tablet (40 mg total) by mouth daily. 90 tablet 11  . Probiotic Product (PROBIOTIC DAILY PO) Take by mouth.    Marland Kitchen PROVENTIL HFA 108 (90 Base) MCG/ACT inhaler INHALE 2 PUFFS INTO THE LUNGS EVERY 6 HOURS AS NEEDED FOR WHEEZING. 3 each 5  . ROZEREM 8 MG tablet TAKE 1 TABLET (8MG  TOTAL) BY MOUTH AT BEDTIME AS NEEDED FOR SLEEP. 30 tablet 3  . Fluticasone-Salmeterol (ADVAIR DISKUS) 250-50 MCG/DOSE AEPB Inhale 1 puff into the lungs 2 (two) times daily. 60 each 6   Current Facility-Administered Medications  Medication Dose Route Frequency Provider Last Rate Last Dose  . 0.9 %  sodium chloride infusion  500 mL Intravenous Once Armbruster, Carlota Raspberry, MD        Physical Exam:     BP 122/68 (BP Location: Left Arm, Patient Position: Sitting, Cuff Size: Normal)   Pulse 72   Temp 98.1 F (36.7 C) (Oral)   Ht 5\' 3"  (1.6 m)   Wt 144 lb 8 oz (65.5 kg)   BMI 25.60 kg/m   GENERAL:  Pleasant female in NAD PSYCH: : Cooperative, normal affect EENT:  conjunctiva pink, mucous membranes moist, neck supple without masses CARDIAC:  RRR,  no peripheral edema PULM: Normal  respiratory effort, lungs CTA bilaterally, no wheezing ABDOMEN:  Nondistended, soft, nontender. No obvious masses, no hepatomegaly,  normal bowel sounds SKIN:  turgor, no lesions seen Musculoskeletal:  Normal muscle tone, normal strength NEURO: Alert and oriented x 3, no focal neurologic deficits  I spent 25 minutes of face-to-face time with the patient. Greater than 50% of the time was spent counseling and coordinating care. Questions answered  Tye Savoy , NP 06/24/2019, 9:27 AM

## 2019-06-24 NOTE — Patient Instructions (Signed)
If you are age 65 or younger, your body mass index should be between 19-25. Your Body mass index is 25.6 kg/m. If this is out of the aformentioned range listed, please consider follow up with your Primary Care Provider.   Continue Linzess as needed.   Continue Bentyl twice daily for 2 weeks.   Thank you for choosing me and [facility].  [place]

## 2019-07-01 ENCOUNTER — Other Ambulatory Visit: Payer: Self-pay

## 2019-07-01 ENCOUNTER — Encounter: Payer: Self-pay | Admitting: Licensed Clinical Social Worker

## 2019-07-01 ENCOUNTER — Ambulatory Visit (INDEPENDENT_AMBULATORY_CARE_PROVIDER_SITE_OTHER): Payer: Self-pay | Admitting: Licensed Clinical Social Worker

## 2019-07-01 DIAGNOSIS — F4320 Adjustment disorder, unspecified: Secondary | ICD-10-CM

## 2019-07-01 NOTE — BH Specialist Note (Signed)
Integrated Behavioral Health Visit via Telemedicine (Telephone)  07/01/2019 KANDIE TAMAS [id number]   Session Start time: 12:00  Session End time: 12:30 Total time: 30 minutes  Referring Provider: Dr. Dareen Piano Type of Visit: Telephonic Patient location: Home [facility] Provider location: Office All persons participating in visit: Patient and [facility]  Confirmed patient's address: Yes  Confirmed patient's phone number: Yes  Any changes to demographics: No   Discussed confidentiality: Yes    The following statements were read to the patient and/or legal guardian that are established with the [facility] Provider.  "The purpose of this phone visit is to provide behavioral health care while limiting exposure to the coronavirus (COVID19).  There is a possibility of technology failure and discussed alternative modes of communication if that failure occurs."  "By engaging in this telephone visit, you consent to the provision of healthcare.  Additionally, you authorize for your insurance to be billed for the services provided during this telephone visit."   Patient and/or legal guardian consented to telephone visit: No   PRESENTING CONCERNS: Patient and/or family reports the following symptoms/concerns: sadness, lack of motivation, and stress. Duration of problem: since June; Severity of problem: mild  GOALS ADDRESSED: Patient will: 1.  Reduce symptoms of: anxiety and stress  2.  Increase knowledge and/or ability of: coping skills, healthy habits and stress reduction  3.  Demonstrate ability to: Increase healthy adjustment to current life circumstances and Increase adequate support systems for patient/family  INTERVENTIONS: Interventions utilized:  Mindfulness or Relaxation Training, Behavioral Activation, Brief CBT and Supportive Counseling Standardized Assessments completed: assessed for SI, HI, and self-harm.  ASSESSMENT: Patient currently experiencing mild anxiety. Patient  reported that her hair is falling out, and she is very concerned that at the rate it is coming out. Patient wants to come into our clinic to determine if something is medically wrong with her. Patient is fearful that her hair is falling out due to her having contracted the Coronavirus in her past. Patient was not open to the idea that stress could be the cause. Patient processed the health changes she is experiencing since she contracted the virus.   Patient is continuing to pray for her friendship that has dissolved over the past several months. Patient is trying to pray for this friend, and focus only on what she can control.   Patient may benefit from counseling.  PLAN: 1. Follow up with behavioral health clinician on : two weeks.  Aimee Davis

## 2019-07-06 ENCOUNTER — Other Ambulatory Visit: Payer: Self-pay

## 2019-07-06 ENCOUNTER — Ambulatory Visit (INDEPENDENT_AMBULATORY_CARE_PROVIDER_SITE_OTHER): Payer: Self-pay | Admitting: Internal Medicine

## 2019-07-06 ENCOUNTER — Encounter: Payer: Self-pay | Admitting: Internal Medicine

## 2019-07-06 VITALS — BP 115/76 | HR 79 | Temp 98.1°F | Ht 63.0 in | Wt 147.1 lb

## 2019-07-06 DIAGNOSIS — L659 Nonscarring hair loss, unspecified: Secondary | ICD-10-CM

## 2019-07-06 DIAGNOSIS — Z8619 Personal history of other infectious and parasitic diseases: Secondary | ICD-10-CM

## 2019-07-06 DIAGNOSIS — H9311 Tinnitus, right ear: Secondary | ICD-10-CM

## 2019-07-06 NOTE — Progress Notes (Signed)
   CC: Hair loss, tinnitus   HPI:  Aimee Davis is a 63 y.o. female with PMHx listed below presenting for hair loss and tinnitus. Please see the A&P for the status of the patient's chronic medical problems.  Past Medical History:  Diagnosis Date  . Allergy    seasonal, PCN, antidepressants, bentyl  . Arthritis   . Asthma   . Carpal tunnel syndrome on both sides    right worse than left 2008  . Cataract 06/11/2013  . COPD (chronic obstructive pulmonary disease) ([place])   . COVID-19 virus infection 06/18/2016  . Eczema 05/02/2018  . GERD (gastroesophageal reflux disease)   . Hyperlipidemia    LDL 108 JULY 2013  . IBS (irritable bowel syndrome)   . Multiple nevi 03/01/2015  . Pneumonia   . Positive TB test    From MArch 2009 note : Recent F/u at [facility]. CXRAy negative.  Positive TST in 2007 (76mm).  Unable to tolerate INH due to hepatotoxicity   Review of Systems:  Performed and all others negative.  Physical Exam: Vitals:   07/06/19 1548  BP: 115/76  Pulse: 79  Temp: 98.1 F (36.7 C)  TempSrc: Oral  SpO2: 100%  Weight: 147 lb 1.6 oz (66.7 kg)  Height: 5\' 3"  (1.6 m)   General: Well nourished female in no acute distress HENT: Diffuse hair thinning without focal patches Pulm: Good air movement with no wheezing or crackles  CV: RRR, no murmurs, no rubs   Assessment & Plan:   See Encounters Tab for problem based charting.  Patient discussed with Dr. Heber Litchfield

## 2019-07-06 NOTE — Patient Instructions (Signed)
Thank you for allowing me to provide your care. Today we are checking some blood work. I will call you as soon as they're available. Continue all your medications as prescribed.  If you notice any hearing loss please come back to the clinic to be evaluated immediately. Otherwise we will see you back in three months.

## 2019-07-07 ENCOUNTER — Encounter: Payer: Self-pay | Admitting: Internal Medicine

## 2019-07-07 ENCOUNTER — Telehealth: Payer: Self-pay | Admitting: Internal Medicine

## 2019-07-07 DIAGNOSIS — L659 Nonscarring hair loss, unspecified: Secondary | ICD-10-CM | POA: Insufficient documentation

## 2019-07-07 DIAGNOSIS — H9319 Tinnitus, unspecified ear: Secondary | ICD-10-CM | POA: Insufficient documentation

## 2019-07-07 HISTORY — DX: Nonscarring hair loss, unspecified: L65.9

## 2019-07-07 HISTORY — DX: Tinnitus, unspecified ear: H93.19

## 2019-07-07 LAB — FERRITIN: Ferritin: 42 ng/mL (ref 15–150)

## 2019-07-07 LAB — TSH: TSH: 1.71 u[IU]/mL (ref 0.450–4.500)

## 2019-07-07 MED ORDER — FERROUS SULFATE 325 (65 FE) MG PO TABS: 325.0000 mg | ORAL_TABLET | Freq: Every day | ORAL | 1 refills | Status: DC

## 2019-07-07 NOTE — Telephone Encounter (Signed)
See prior telephone note

## 2019-07-07 NOTE — Assessment & Plan Note (Addendum)
Presents for 1 to 2 months of her loss. She has noticed it primarily when combing her hair, washing her hair, or going to the salon to have her hair done. She is not changed shampoos. She has not noticed any new rashes, fevers, joint pain, abdominal pain, diarrhea, hematuria, headaches. She has noticed thinning of her nails and constipation however she denies fatigue, weight gain, decreased appetite changes in sleep. She is not had any increased stress lately, she was diagnosed with COVID three months ago.  A/P: - TSH within normal limits  - Ferritin on the lower end of normal  - Does not have the stereotypical pattern of androgenic alopecia  - No medication changes recently - Suspect Telogen Effluvium but if things progress may need to rule out Alopecia totalis with autoimmune work-up and basic blood work  - Encouraged patient to take multivitamin

## 2019-07-07 NOTE — Assessment & Plan Note (Signed)
Patient endorses ringing in her right ear for the past 1 to 2 weeks. It is only there when she closes her eyes. It stops when she opens her eyes. She is not noticed any other symptoms or correlations.  On physical exam Weber and Debroah Baller hearing tests are normal.  A/P: - Continue to monitor - Will need advanced imaging if she begins to have hearing loss

## 2019-07-07 NOTE — Telephone Encounter (Signed)
Called patient to discuss lab results. Will try some iron supplementation. If her hair loss progresses we will need additional blood work.   Ina Homes, MD

## 2019-07-08 NOTE — Progress Notes (Signed)
Internal Medicine Clinic Attending  Case discussed with Dr. Helberg at the time of the visit.  We reviewed the resident's history and exam and pertinent patient test results.  I agree with the assessment, diagnosis, and plan of care documented in the resident's note.    

## 2019-07-13 ENCOUNTER — Ambulatory Visit: Payer: Self-pay | Admitting: Nurse Practitioner

## 2019-07-15 ENCOUNTER — Telehealth: Payer: Self-pay | Admitting: Licensed Clinical Social Worker

## 2019-07-15 ENCOUNTER — Ambulatory Visit: Payer: Self-pay | Admitting: Licensed Clinical Social Worker

## 2019-07-15 NOTE — Telephone Encounter (Signed)
Patient had a scheduled appointment, and did not answer. A vm was left for the patient to reschedule if desired.

## 2019-07-16 ENCOUNTER — Telehealth: Payer: Self-pay | Admitting: Internal Medicine

## 2019-07-16 NOTE — Telephone Encounter (Signed)
Attempted to call patient. No answer. HIPAA compliant voicemail left. She only needs to take iron once daily. If she calls back please inform her of this.  Ina Homes, MD

## 2019-07-16 NOTE — Telephone Encounter (Signed)
Gave pt message she repeated back

## 2019-07-16 NOTE — Telephone Encounter (Signed)
Pt is having questions about her iron medicine, pls contact [phone]

## 2019-07-16 NOTE — Telephone Encounter (Signed)
Pt wanted to confirm iron supplement dose, states she thought dr Tarri Abernethy told her to take 2x daily but script stated 1x daily. Please advise

## 2019-07-21 ENCOUNTER — Ambulatory Visit: Payer: Self-pay | Admitting: Gastroenterology

## 2019-07-29 ENCOUNTER — Ambulatory Visit: Payer: Self-pay | Admitting: Licensed Clinical Social Worker

## 2019-08-11 ENCOUNTER — Telehealth: Payer: Self-pay | Admitting: Internal Medicine

## 2019-08-11 NOTE — Telephone Encounter (Signed)
Pls contact pt [phone]; pt is unsure if she needs to continue to keep taking her coumadin medicine, if so she needs a refill she is on the last pill.Aimee Davis

## 2019-08-11 NOTE — Telephone Encounter (Signed)
I talked with patient. She was asking about refill on iron medication - no need for refill at this time. Can we schedule patient for followup appointment for early next week. I have [place] on Monday and CC on Tuesday. Patient with fatigue and hair loss.

## 2019-08-12 ENCOUNTER — Other Ambulatory Visit: Payer: Self-pay

## 2019-08-12 ENCOUNTER — Encounter: Payer: Self-pay | Admitting: Licensed Clinical Social Worker

## 2019-08-12 ENCOUNTER — Ambulatory Visit (INDEPENDENT_AMBULATORY_CARE_PROVIDER_SITE_OTHER): Payer: Self-pay | Admitting: Licensed Clinical Social Worker

## 2019-08-12 DIAGNOSIS — F4320 Adjustment disorder, unspecified: Secondary | ICD-10-CM

## 2019-08-12 NOTE — BH Specialist Note (Signed)
Integrated Behavioral Health Visit via Telemedicine (Telephone)  08/12/2019 Aimee Davis [id number]   Session Start time: 10:00  Session End time: 10:30 Total time: 30 minutes  Referring Provider: Dr. Dareen Piano Type of Visit: Telephonic Patient location: Home [name] Provider location: Remote All persons participating in visit: Patient and [facility]  Confirmed patient's address: Yes  Confirmed patient's phone number: Yes  Any changes to demographics: No   Discussed confidentiality: Yes    The following statements were read to the patient and/or legal guardian that are established with the [facility] Provider.  "The purpose of this phone visit is to provide behavioral health care while limiting exposure to the coronavirus (COVID19).  There is a possibility of technology failure and discussed alternative modes of communication if that failure occurs."  "By engaging in this telephone visit, you consent to the provision of healthcare.  Additionally, you authorize for your insurance to be billed for the services provided during this telephone visit."   Patient and/or legal guardian consented to telephone visit: Yes   PRESENTING CONCERNS: Patient and/or family reports the following symptoms/concerns: sadness, lack of motivation, and stress. Duration of problem: Since June; Severity of problem: moderate  GOALS ADDRESSED: Patient will: 1.  Reduce symptoms of: depression and stress  2.  Increase knowledge and/or ability of: coping skills, healthy habits and stress reduction  3.  Demonstrate ability to: Increase healthy adjustment to current life circumstances and Increase adequate support systems for patient/family  INTERVENTIONS: Interventions utilized:  Behavioral Activation, Brief CBT and Supportive Counseling Standardized Assessments completed: assessed for SI, HI, and self-harm.  ASSESSMENT: Patient currently experiencing moderate levels of depression that has recently  increased. Patient reported that she feels depressed as soon as she wakes up in the morning. Patient is isolating more over the past two weeks. Patient reported that she has not engaged in any activities over the past two weeks to bring her joy. Patient is fatigue more than normal.  Patient was unable to identify what thoughts are contributing to her increased sadness, but identified that she is "feeling" sadness more often. Patient recognized that she is going to have to challenge her negative thought patterns and lack of motivation. No plans to harm herself or other.   Patient may benefit from counseling.  PLAN: 1. Follow up with behavioral health clinician on : two weeks.  Aimee Davis, [facility], [place]

## 2019-08-16 ENCOUNTER — Telehealth: Payer: Self-pay | Admitting: Internal Medicine

## 2019-08-16 NOTE — Telephone Encounter (Signed)
   Reason for call:   I received a call from [name] at 4:45 PM due to concerns of belching, decreased appetite and fatigue for several days.    Pertinent Data:   No fevers, chills, chest pain, shortness of breath, n/v.    Assessment / Plan / Recommendations:   Patient calls due to concerns for what sounds like uncontrolled GERD symptoms despite taking prescribed protonix. She has also tried OTC Gas-X without relief. Additionally reports decreased appetite and fatigue. I have messaged the front desk pool to call and schedule her an appointment to be seen in [facility] early next week.   As always, Aimee Davis is advised that if symptoms worsen or new symptoms arise, they should go to an urgent care facility or to to ER for further evaluation.   Modena Nunnery D, DO   08/16/2019, 4:58 PM

## 2019-08-16 NOTE — Telephone Encounter (Signed)
Tried to reach Aimee Davis after she had called after hours number requesting a physician call-back but was not able to reach her and there was no voicemail set up.

## 2019-08-17 ENCOUNTER — Encounter: Payer: Self-pay | Admitting: Internal Medicine

## 2019-08-17 ENCOUNTER — Ambulatory Visit (INDEPENDENT_AMBULATORY_CARE_PROVIDER_SITE_OTHER): Payer: Self-pay | Admitting: Internal Medicine

## 2019-08-17 ENCOUNTER — Telehealth: Payer: Self-pay | Admitting: Nurse Practitioner

## 2019-08-17 ENCOUNTER — Other Ambulatory Visit: Payer: Self-pay

## 2019-08-17 VITALS — BP 120/71 | HR 91 | Temp 98.3°F | Wt 142.4 lb

## 2019-08-17 DIAGNOSIS — R5383 Other fatigue: Secondary | ICD-10-CM

## 2019-08-17 DIAGNOSIS — L988 Other specified disorders of the skin and subcutaneous tissue: Secondary | ICD-10-CM

## 2019-08-17 DIAGNOSIS — M79669 Pain in unspecified lower leg: Secondary | ICD-10-CM

## 2019-08-17 DIAGNOSIS — Z6825 Body mass index (BMI) 25.0-25.9, adult: Secondary | ICD-10-CM

## 2019-08-17 DIAGNOSIS — R197 Diarrhea, unspecified: Secondary | ICD-10-CM

## 2019-08-17 DIAGNOSIS — R634 Abnormal weight loss: Secondary | ICD-10-CM

## 2019-08-17 DIAGNOSIS — L659 Nonscarring hair loss, unspecified: Secondary | ICD-10-CM

## 2019-08-17 DIAGNOSIS — K219 Gastro-esophageal reflux disease without esophagitis: Secondary | ICD-10-CM

## 2019-08-17 DIAGNOSIS — Z79899 Other long term (current) drug therapy: Secondary | ICD-10-CM

## 2019-08-17 DIAGNOSIS — R110 Nausea: Secondary | ICD-10-CM

## 2019-08-17 MED ORDER — FAMOTIDINE 20 MG PO TABS: 20.0000 mg | ORAL_TABLET | Freq: Two times a day (BID) | ORAL | 0 refills | Status: DC

## 2019-08-17 NOTE — Assessment & Plan Note (Addendum)
Patient reports continued fatigue with a small amount of weight loss over the past several months.  Patient reports decrease in appetite.  Patient does meet criteria for major depression, but the symptoms appear to all be source to her fatigue.  Previous work-up for HIV, TSH was within normal limits.  Patient did have prior leukopenia - will reevalaute with CBC+Diff.  Given patient fatigue and GI symptoms (known IBS), will perform testing for celiac disease. Will check Vitamin D level given fatigue and pretibial bone pain on exam. * Gliadin IgA and tTG-IgA * CBC with Diff * CMP * Vitamin D level

## 2019-08-17 NOTE — Telephone Encounter (Signed)
Patient scheduled with PG on 12/18 at 11:00 am.   Patient scheduled with PCP today at 1:15 pm for burping and low energy.

## 2019-08-17 NOTE — Assessment & Plan Note (Signed)
Patient presents with a 8-month history of hair loss.  She locates most of the hair loss occurring in the superior-posterior aspect of her scalp.  The symptoms have been occurring in the setting of fatigue.  Prior evaluation with TSH was within normal limits, ferritin was on the lower end of normal.  Hair loss may be secondary to telogen effluvium in the setting of Covid infection in June.  However, given primary reported location of hair loss, will evaluate for androgenic state. * DHEAS and total testosterone

## 2019-08-17 NOTE — Assessment & Plan Note (Signed)
Patient reports frequent burping over the past 3 days.  Patient has tried Gas-X without relief.  In setting of patient's GERD, symptoms are likely secondary to reflux.   * Continue protonix and start famotidine 20 mg BID

## 2019-08-17 NOTE — Assessment & Plan Note (Signed)
Multiple skin macules as below. Appearance is a low risk for melanoma. We will continue to follow for progression.

## 2019-08-17 NOTE — Patient Instructions (Signed)
You were seen for followup on your fatigue and hair loss.  We are conducting blood work to try to figure out what is causing your fatigue and hair loss.  We have also sent your prescription for Pepcid which you can take to help stop your burping.  Thank you for allowing [place] to be part of your medical care!

## 2019-08-17 NOTE — Telephone Encounter (Signed)
Pt is requesting an appt asap, first available with APP on 12/18 but pt wants sooner. She states that she has no apetite, is constantly burping and fells very weak. Pls call her.

## 2019-08-17 NOTE — Progress Notes (Signed)
   CC: Fatigue, hair loss, burping  HPI: Patient is a 63 year old female with past medical history as below who presents with chief complaints of fatigue, hair loss, and burping.  [name] is a 63 y.o.   Past Medical History:  Diagnosis Date  . Allergy    seasonal, PCN, antidepressants, bentyl  . Arthritis   . Asthma   . Carpal tunnel syndrome on both sides    right worse than left 2008  . Cataract 06/11/2013  . COPD (chronic obstructive pulmonary disease) ([place])   . COVID-19 virus infection 06/18/2016  . Eczema 05/02/2018  . GERD (gastroesophageal reflux disease)   . Hyperlipidemia    LDL 108 JULY 2013  . IBS (irritable bowel syndrome)   . Multiple nevi 03/01/2015  . Pneumonia   . Positive TB test    From M[facility] 2009 note : Recent F/u at [facility]. CXRAy negative.  Positive TST in 2007 (57mm).  Unable to tolerate INH due to hepatotoxicity   Review of Systems:   Review of Systems  Constitutional: Negative for chills and fever.  HENT: Negative for congestion.   Respiratory: Negative for cough and shortness of breath.   Cardiovascular: Negative for chest pain.  Gastrointestinal: Positive for diarrhea and nausea. Negative for abdominal pain, constipation and vomiting.  Genitourinary: Negative for dysuria, frequency and urgency.  All other systems reviewed and are negative.    Physical Exam:  Vitals:   08/17/19 1320  BP: 120/71  Pulse: 91  Temp: 98.3 F (36.8 C)  TempSrc: Oral  SpO2: 99%  Weight: 142 lb 6.4 oz (64.6 kg)   Physical Exam  Constitutional: She is well-developed, well-nourished, and in no distress.  HENT:  Head: Normocephalic and atraumatic.  Eyes: EOM are normal. Right eye exhibits no discharge. Left eye exhibits no discharge.  Neck: Normal range of motion. No tracheal deviation present.  Cardiovascular: Normal rate and regular rhythm. Exam reveals no gallop and no friction rub.  No murmur heard. Pulmonary/Chest: Effort normal and breath sounds  normal. No respiratory distress. She has no wheezes. She has no rales.  Abdominal: Soft. She exhibits no distension. There is no abdominal tenderness. There is no rebound and no guarding.  Musculoskeletal: Normal range of motion.        General: Tenderness (Tenderness of bilateral shins to palpation) present. No deformity or edema.  Neurological: She is alert. Coordination normal.  Skin: Skin is warm and dry. No rash noted. She is not diaphoretic. No erythema.  Psychiatric: Memory and judgment normal.     Assessment & Plan:   See Encounters Tab for problem based charting.  Patient seen and discussed with Dr. Daryll Drown

## 2019-08-18 ENCOUNTER — Telehealth: Payer: Self-pay | Admitting: Internal Medicine

## 2019-08-18 LAB — VITAMIN D 25 HYDROXY (VIT D DEFICIENCY, FRACTURES): Vit D, 25-Hydroxy: 51.8 ng/mL (ref 30.0–100.0)

## 2019-08-18 LAB — CMP14 + ANION GAP
ALT: 17 IU/L (ref 0–32)
AST: 22 IU/L (ref 0–40)
Albumin/Globulin Ratio: 2.6 — ABNORMAL HIGH (ref 1.2–2.2)
Albumin: 4.7 g/dL (ref 3.8–4.8)
Alkaline Phosphatase: 65 IU/L (ref 39–117)
Anion Gap: 20 mmol/L — ABNORMAL HIGH (ref 10.0–18.0)
BUN/Creatinine Ratio: 11 — ABNORMAL LOW (ref 12–28)
BUN: 12 mg/dL (ref 8–27)
Bilirubin Total: 0.4 mg/dL (ref 0.0–1.2)
CO2: 19 mmol/L — ABNORMAL LOW (ref 20–29)
Calcium: 10.7 mg/dL — ABNORMAL HIGH (ref 8.7–10.3)
Chloride: 105 mmol/L (ref 96–106)
Creatinine, Ser: 1.05 mg/dL — ABNORMAL HIGH (ref 0.57–1.00)
GFR calc Af Amer: 65 mL/min/{1.73_m2} (ref >59)
GFR calc non Af Amer: 56 mL/min/{1.73_m2} — ABNORMAL LOW (ref >59)
Globulin, Total: 1.8 g/dL (ref 1.5–4.5)
Glucose: 97 mg/dL (ref 65–99)
Potassium: 4.5 mmol/L (ref 3.5–5.2)
Sodium: 144 mmol/L (ref 134–144)
Total Protein: 6.5 g/dL (ref 6.0–8.5)

## 2019-08-18 LAB — CBC WITH DIFFERENTIAL/PLATELET
Basophils Absolute: 0 10*3/uL (ref 0.0–0.2)
Basos: 0 %
EOS (ABSOLUTE): 0 10*3/uL (ref 0.0–0.4)
Eos: 0 %
Hematocrit: 38.7 % (ref 34.0–46.6)
Hemoglobin: 13.1 g/dL (ref 11.1–15.9)
Immature Grans (Abs): 0 10*3/uL (ref 0.0–0.1)
Immature Granulocytes: 0 %
Lymphocytes Absolute: 0.5 10*3/uL — ABNORMAL LOW (ref 0.7–3.1)
Lymphs: 17 %
MCH: 27.3 pg (ref 26.6–33.0)
MCHC: 33.9 g/dL (ref 31.5–35.7)
MCV: 81 fL (ref 79–97)
Monocytes Absolute: 0.3 10*3/uL (ref 0.1–0.9)
Monocytes: 9 %
Neutrophils Absolute: 2.2 10*3/uL (ref 1.4–7.0)
Neutrophils: 74 %
Platelets: 254 10*3/uL (ref 150–450)
RBC: 4.8 x10E6/uL (ref 3.77–5.28)
RDW: 12.8 % (ref 11.7–15.4)
WBC: 3 10*3/uL — ABNORMAL LOW (ref 3.4–10.8)

## 2019-08-18 LAB — TESTOSTERONE: Testosterone: 5 ng/dL (ref 3–41)

## 2019-08-18 LAB — DHEA-SULFATE: DHEA-SO4: 84.8 ug/dL (ref 29.4–220.5)

## 2019-08-18 NOTE — Telephone Encounter (Signed)
Please advise, will be glad to call her back w/ advisement.

## 2019-08-18 NOTE — Telephone Encounter (Signed)
Pt seen yesterday by Dr. Benjamine Mola.  Pt reporting she is still unable to sleep and is requesting a nurse call back about what to do.

## 2019-08-19 LAB — GLIADIN IGA+TTG IGA
Antigliadin Abs, IgA: 3 units (ref 0–19)
Transglutaminase IgA: <2 U/mL (ref 0–3)

## 2019-08-19 NOTE — Telephone Encounter (Signed)
Attempted call to patient. No answer. Will try later

## 2019-08-20 NOTE — Progress Notes (Signed)
Internal Medicine Clinic Attending  I saw and evaluated the patient.  I personally confirmed the key portions of the history and exam documented by Dr. MacLean and I reviewed pertinent patient test results.  The assessment, diagnosis, and plan were formulated together and I agree with the documentation in the resident's note.  

## 2019-08-21 NOTE — Telephone Encounter (Signed)
Attempted to call patient back at [phone]. Call goes right to voicemail

## 2019-08-21 NOTE — Telephone Encounter (Signed)
Pt is calling back [phone]; pt still unsure to sleep

## 2019-08-24 NOTE — Telephone Encounter (Signed)
Called patient, discussed results and counseled patient. Symptoms most likely secondary to continued post-COVID fatigue

## 2019-08-26 ENCOUNTER — Other Ambulatory Visit: Payer: Self-pay

## 2019-08-26 ENCOUNTER — Encounter: Payer: Self-pay | Admitting: Licensed Clinical Social Worker

## 2019-08-26 ENCOUNTER — Ambulatory Visit (INDEPENDENT_AMBULATORY_CARE_PROVIDER_SITE_OTHER): Payer: Self-pay | Admitting: Licensed Clinical Social Worker

## 2019-08-26 DIAGNOSIS — F4320 Adjustment disorder, unspecified: Secondary | ICD-10-CM

## 2019-08-26 NOTE — BH Specialist Note (Signed)
Integrated Behavioral Health Visit via Telemedicine (Telephone)  08/26/2019 CAIDYN RUNSER [id number]   Session Start time: 12:35  Session End time: 1:05 Total time: 30 minutes  Referring Provider: Dr.Narendra Type of Visit: Telephonic Patient location: Home [facility] Provider location: Office All persons participating in visit: Patient and [facility]  Confirmed patient's address: Yes  Confirmed patient's phone number: Yes  Any changes to demographics: No   Discussed confidentiality: Yes    The following statements were read to the patient and/or legal guardian that are established with the [facility] Provider.  "The purpose of this phone visit is to provide behavioral health care while limiting exposure to the coronavirus (COVID19).  There is a possibility of technology failure and discussed alternative modes of communication if that failure occurs."  "By engaging in this telephone visit, you consent to the provision of healthcare.  Additionally, you authorize for your insurance to be billed for the services provided during this telephone visit."   Patient and/or legal guardian consented to telephone visit: Yes   PRESENTING CONCERNS: Patient and/or family reports the following symptoms/concerns: sadness, anxiety, lack of motivation, and stress. Duration of problem: Since June; Severity of problem: moderate  GOALS ADDRESSED: Patient will: 1.  Reduce symptoms of: anxiety, depression and stress  2.  Increase knowledge and/or ability of: coping skills, healthy habits and stress reduction  3.  Demonstrate ability to: Increase healthy adjustment to current life circumstances and Increase adequate support systems for patient/family  INTERVENTIONS: Interventions utilized:  Mindfulness or Relaxation Training, Brief CBT, Supportive Counseling and Sleep Hygiene Standardized Assessments completed: Not Needed  ASSESSMENT:  Patient processed a recent episode where she experienced odd  symptoms (fatigue, lack of appetite, and insomnia), and how she talked herself through the episode. During this episode, the patient could not sleep and had a decreased appetite. Patient's sleep and appetite has improved since this episode. Patient processed fears related to what she experienced a week ago and while she had contracted Covid.   Patient may benefit from counseling.  PLAN: 1. Follow up with behavioral health clinician on : one week.   Dessie Coma, [facility], [place]

## 2019-09-03 ENCOUNTER — Other Ambulatory Visit: Payer: Self-pay

## 2019-09-03 ENCOUNTER — Encounter: Payer: Self-pay | Admitting: Gastroenterology

## 2019-09-03 ENCOUNTER — Ambulatory Visit (INDEPENDENT_AMBULATORY_CARE_PROVIDER_SITE_OTHER): Payer: Self-pay | Admitting: Licensed Clinical Social Worker

## 2019-09-03 ENCOUNTER — Ambulatory Visit (INDEPENDENT_AMBULATORY_CARE_PROVIDER_SITE_OTHER): Payer: Self-pay | Admitting: Gastroenterology

## 2019-09-03 ENCOUNTER — Encounter: Payer: Self-pay | Admitting: Licensed Clinical Social Worker

## 2019-09-03 VITALS — BP 108/58 | HR 88 | Temp 98.2°F | Ht 63.5 in | Wt 147.0 lb

## 2019-09-03 DIAGNOSIS — K219 Gastro-esophageal reflux disease without esophagitis: Secondary | ICD-10-CM

## 2019-09-03 DIAGNOSIS — K581 Irritable bowel syndrome with constipation: Secondary | ICD-10-CM

## 2019-09-03 DIAGNOSIS — F4320 Adjustment disorder, unspecified: Secondary | ICD-10-CM

## 2019-09-03 DIAGNOSIS — G8929 Other chronic pain: Secondary | ICD-10-CM

## 2019-09-03 DIAGNOSIS — R142 Eructation: Secondary | ICD-10-CM

## 2019-09-03 DIAGNOSIS — R1013 Epigastric pain: Secondary | ICD-10-CM

## 2019-09-03 NOTE — Patient Instructions (Addendum)
If you are age 65 or older, your body mass index should be between 23-30. Your Body mass index is 25.63 kg/m. If this is out of the aforementioned range listed, please consider follow up with your Primary Care Provider.  If you are age 14 or younger, your body mass index should be between 19-25. Your Body mass index is 25.63 kg/m. If this is out of the aformentioned range listed, please consider follow up with your Primary Care Provider.   SIBO test - complete and mail back per instructions.   Linzess daily.  Bentyl as needed.   Continue Pepcid 20 mg daily.

## 2019-09-03 NOTE — Progress Notes (Signed)
09/03/2019 Aimee Davis [id number] 05/11/1956   HISTORY OF PRESENT ILLNESS: This is a 63 year old female who used to follow in our office with Dr. Deatra Ina, but then began following with Dr. Roney Mans at [facility].  Apparently they stopped taking her insurance and/or she wanted to switch back to someone closer to home so she is now been following with Dr. Havery Moros since earlier this year.  She is here again today with complaints of constipation.  She tells me that she has only been using her Linzess 145 mcg intermittently.  Has dicyclomine 10 mg that she uses as needed for abdominal pain.  Also complains of intermittent belching especially with eating.  Had previously been on pantoprazole 40 mg daily, but has discontinued that and started Pepcid 20 mg daily, which has seemed to help the belching to some degree.  She is asking about SIBO testing as they had previously recommended that when she was seen at [facility] in the past, but was never performed.  Colonoscopy with Dr. Havery Moros in March of this year, 2020, she is found to have one diminutive polyp that was removed, tortuous colon, internal hemorrhoids.  Polyp was tubular adenoma, but due to size repeat was recommended in 7 years.   Past Medical History:  Diagnosis Date  . Allergy    seasonal, PCN, antidepressants, bentyl  . Arthritis   . Asthma   . Carpal tunnel syndrome on both sides    right worse than left 2008  . Cataract 06/11/2013  . COPD (chronic obstructive pulmonary disease) ([place])   . COVID-19 virus infection 06/18/2016  . Eczema 05/02/2018  . GERD (gastroesophageal reflux disease)   . Hyperlipidemia    LDL 108 JULY 2013  . IBS (irritable bowel syndrome)   . Multiple nevi 03/01/2015  . Pneumonia   . Positive TB test    From MArch 2009 note : Recent F/u at [facility]. CXRAy negative.  Positive TST in 2007 (34mm).  Unable to tolerate INH due to hepatotoxicity   Past Surgical History:  Procedure Laterality Date  . BREAST CYST  EXCISION Right 2011  . BREAST EXCISIONAL BIOPSY Left    benign  . BREAST LUMPECTOMY Right 1990's  . INDUCED ABORTION  2005  . VAGINAL HYSTERECTOMY  1983   partial; for abnormal bleeding    reports that she quit smoking about 6 years ago. Her smoking use included cigarettes. She has a 14.19 pack-year smoking history. She has never used smokeless tobacco. She reports previous drug use. She reports that she does not drink alcohol. family history includes Asthma in her son; Cancer in her sister; Diabetes in her father, mother, and sister; Heart disease in her father and mother; Hyperlipidemia in her father and mother; Hypertension in her father, maternal aunt, maternal uncle, mother, paternal aunt, paternal uncle, and sister; Kidney disease in her father, mother, paternal aunt, paternal uncle, and sister; Mental illness in her brother and sister; Stroke in her father. Allergies  Allergen Reactions  . Penicillins Other (See Comments)    REACTION: rash      Outpatient Encounter Medications as of 09/03/2019  Medication Sig  . Artificial Tear Ointment (DRY EYES OP) Apply 1 drop to eye daily as needed (dry eyes).  . Ascorbic Acid (VITAMIN C) 100 MG tablet Take 50 mg by mouth daily.  . B Complex-C (B-COMPLEX WITH VITAMIN C) tablet Take 1 tablet by mouth daily.  . cetirizine (ZYRTEC) 10 MG tablet Take 10 mg by mouth daily.  . Cholecalciferol (  VITAMIN D) 125 MCG (5000 UT) CAPS Take 1-2 tablets by mouth daily.  . DHA-EPA-Vitamin E (OMEGA-3 COMPLEX PO) Take 1 tablet by mouth daily.  Marland Kitchen dicyclomine (BENTYL) 10 MG capsule Take 1 capsule (10 mg total) by mouth every 8 (eight) hours as needed for spasms.  . famotidine (PEPCID) 20 MG tablet Take 1 tablet (20 mg total) by mouth 2 (two) times daily.  . ferrous sulfate 325 (65 FE) MG tablet Take 1 tablet (325 mg total) by mouth daily.  . Fluticasone-Salmeterol (ADVAIR DISKUS) 250-50 MCG/DOSE AEPB Inhale 1 puff into the lungs 2 (two) times daily.  Marland Kitchen linaclotide  (LINZESS) 145 MCG CAPS capsule Take 1 capsule (145 mcg total) by mouth daily before breakfast.  . mometasone (NASONEX) 50 MCG/ACT nasal spray PLACE 2 SPRAYS IN EACH NOSTRIL ONCE DAILY AS NEEDED.  . Multiple Vitamin (MULTIVITAMIN) capsule Take 1 capsule by mouth daily.  . Multiple Vitamins-Minerals (ZINC PO) Take 1 tablet by mouth daily.  . pantoprazole (PROTONIX) 40 MG tablet Take 1 tablet (40 mg total) by mouth daily.  . Probiotic Product (PROBIOTIC DAILY PO) Take by mouth.  Marland Kitchen PROVENTIL HFA 108 (90 Base) MCG/ACT inhaler INHALE 2 PUFFS INTO THE LUNGS EVERY 6 HOURS AS NEEDED FOR WHEEZING.  . [DISCONTINUED] Magnesium Hydroxide (MILK OF MAGNESIA PO) Take by mouth as needed.  . [DISCONTINUED] ROZEREM 8 MG tablet TAKE 1 TABLET (8MG  TOTAL) BY MOUTH AT BEDTIME AS NEEDED FOR SLEEP.   Facility-Administered Encounter Medications as of 09/03/2019  Medication  . 0.9 %  sodium chloride infusion     REVIEW OF SYSTEMS  : All other systems reviewed and negative except where noted in the History of Present Illness.   PHYSICAL EXAM: BP (!) 108/58 (BP Location: Left Arm, Patient Position: Sitting, Cuff Size: Normal)   Pulse 88   Temp 98.2 F (36.8 C)   Ht 5' 3.5" (1.613 m) Comment: height measured without shoes  Wt 147 lb (66.7 kg)   BMI 25.63 kg/m  General: Well developed AA female in no acute distress Head: Normocephalic and atraumatic Eyes:  Sclerae anicteric, conjunctiva pink. Ears: Normal auditory acuity Lungs: Clear throughout to auscultation; no increased WOB. Heart: Regular rate and rhythm; no M/R/G. Abdomen: Soft, non-distended.  BS present.  Non-tender. Musculoskeletal: Symmetrical with no gross deformities  Skin: No lesions on visible extremities Extremities: No edema  Neurological: Alert oriented x 4, grossly non-focal Psychological:  Alert and cooperative. Normal mood and affect  ASSESSMENT AND PLAN: 63 year old female with GERD and IBS-C here today with complaints of  constipation, belching, epigastric abdominal pain.  Previously given a diagnosis of IBS versus abdominal wall pain versus SIBO when seen at [facility] in the past.  Apparently had trigger point injections that helped with her abdominal pain.  They had ordered SIBO testing, but she never had that performed.  She would like to proceed with that at this time.  Otherwise she will continue her Linzess but I have advised her that she needs to use this on a daily basis.  If after continuing for several days in a row it seems to be too much, that we could consider reducing the dose to 72 mcg daily.  Continue Bentyl as needed.  Continue Pepcid 20 mg daily.  CC:  Jeanmarie Hubert, MD

## 2019-09-03 NOTE — BH Specialist Note (Signed)
Integrated Behavioral Health Visit via Telemedicine (Telephone)  09/03/2019 [name] [id number]   Session Start time: 1:40  Session End time: 2:10 Total time: 30 minutes  Referring Provider: Dr. Dareen Piano Type of Visit: Telephonic Patient location: Home [facility] Provider location: Office All persons participating in visit: Patient and [facility]  Confirmed patient's address: Yes  Confirmed patient's phone number: Yes  Any changes to demographics: No   Discussed confidentiality: Yes    The following statements were read to the patient and/or legal guardian that are established with the [facility] Provider.  "The purpose of this phone visit is to provide behavioral health care while limiting exposure to the coronavirus (COVID19).  There is a possibility of technology failure and discussed alternative modes of communication if that failure occurs."  "By engaging in this telephone visit, you consent to the provision of healthcare.  Additionally, you authorize for your insurance to be billed for the services provided during this telephone visit."   Patient and/or legal guardian consented to telephone visit: Yes   PRESENTING CONCERNS: Patient and/or family reports the following symptoms/concerns: sadness, anxiety, lack of motivation, and stress. Duration of problem: Since June; Severity of problem: mild  GOALS ADDRESSED: Patient will: 1.  Reduce symptoms of: agitation, depression and stress  2.  Increase knowledge and/or ability of: coping skills, healthy habits and stress reduction  3.  Demonstrate ability to: Increase healthy adjustment to current life circumstances and Increase adequate support systems for patient/family  INTERVENTIONS: Interventions utilized:  Motivational Interviewing, Behavioral Activation, Brief CBT and Supportive Counseling Standardized Assessments completed: Not Needed  ASSESSMENT: Patient currently experiencing decreased anxiety since the last  session. Patient continues to struggle with loneliness, and is hopeful that she will find a friend to spend time with. Patient's sleep has improved slightly, but she is still having insomnia. Patient continues to have issues with her memory since contracting COVID.  Patient identified that she is going to establish goals for 2021 for self improvement.   Patient processed her childhood, and how that has impacted her as an adult.   Patient may benefit from counseling.  PLAN: Follow up with behavioral health clinician on : two weeks.  Dessie Coma, [facility], [place]

## 2019-09-04 ENCOUNTER — Ambulatory Visit: Payer: Self-pay | Admitting: Nurse Practitioner

## 2019-09-08 ENCOUNTER — Encounter: Payer: Self-pay | Admitting: Gastroenterology

## 2019-09-08 DIAGNOSIS — R1013 Epigastric pain: Secondary | ICD-10-CM | POA: Insufficient documentation

## 2019-09-08 DIAGNOSIS — R142 Eructation: Secondary | ICD-10-CM | POA: Insufficient documentation

## 2019-09-08 DIAGNOSIS — G8929 Other chronic pain: Secondary | ICD-10-CM | POA: Insufficient documentation

## 2019-09-08 HISTORY — DX: Epigastric pain: R10.13

## 2019-09-08 NOTE — Progress Notes (Signed)
Agree with assessment and plan as outlined.  

## 2019-09-22 ENCOUNTER — Other Ambulatory Visit: Payer: Self-pay

## 2019-09-22 ENCOUNTER — Ambulatory Visit (INDEPENDENT_AMBULATORY_CARE_PROVIDER_SITE_OTHER): Payer: Medicare Other | Admitting: Licensed Clinical Social Worker

## 2019-09-22 ENCOUNTER — Encounter: Payer: Self-pay | Admitting: Licensed Clinical Social Worker

## 2019-09-22 DIAGNOSIS — F4320 Adjustment disorder, unspecified: Secondary | ICD-10-CM

## 2019-09-22 NOTE — BH Specialist Note (Signed)
Integrated Behavioral Health Visit via Telemedicine (Telephone)  09/22/2019 KHALIDA SAX [id number]   Session Start time: 12:08  Session End time: 12:30 Total time: 22 minutes  Referring Provider: Dr. Dareen Piano Type of Visit: Telephonic Patient location: Home [facility] Provider location: Office All persons participating in visit: Patient and [facility]  Confirmed patient's address: Yes  Confirmed patient's phone number: Yes  Any changes to demographics: No   Discussed confidentiality: Yes    The following statements were read to the patient and/or legal guardian that are established with the [facility] Provider.  "The purpose of this phone visit is to provide behavioral health care while limiting exposure to the coronavirus (COVID19).  There is a possibility of technology failure and discussed alternative modes of communication if that failure occurs."  "By engaging in this telephone visit, you consent to the provision of healthcare.  Additionally, you authorize for your insurance to be billed for the services provided during this telephone visit."   Patient and/or legal guardian consented to telephone visit: Yes   PRESENTING CONCERNS: Patient and/or family reports the following symptoms/concerns: sadness, anxiety, lack of motivation,  Duration of problem: Since June; Severity of problem: mild  GOALS ADDRESSED: Patient will: 1.  Reduce symptoms of: anxiety, depression and stress  2.  Increase knowledge and/or ability of: coping skills, healthy habits and stress reduction  3.  Demonstrate ability to: Increase healthy adjustment to current life circumstances and Increase adequate support systems for patient/family  INTERVENTIONS: Interventions utilized:  Mindfulness or Relaxation Training, Brief CBT and Supportive Counseling Standardized Assessments completed: Not Needed  ASSESSMENT: Patient currently experiencing mild levels of anxiety. Patient reported that she will wake up in  the morning and begin worrying about others. Patient described this feeling as "grieving". Patient is worried about her mother's health, and processed how she offers suggestions to assist with her care. Patient is practicing gratitude which she is finding to be helpful.   Patient may benefit from counseling.  PLAN: 1. Follow up with behavioral health clinician on : two weeks.   Dessie Coma, [facility], [place]

## 2019-10-06 ENCOUNTER — Ambulatory Visit (INDEPENDENT_AMBULATORY_CARE_PROVIDER_SITE_OTHER): Payer: Medicare Other | Admitting: Licensed Clinical Social Worker

## 2019-10-06 ENCOUNTER — Encounter: Payer: Self-pay | Admitting: Licensed Clinical Social Worker

## 2019-10-06 ENCOUNTER — Other Ambulatory Visit: Payer: Self-pay

## 2019-10-06 DIAGNOSIS — F4320 Adjustment disorder, unspecified: Secondary | ICD-10-CM

## 2019-10-06 NOTE — BH Specialist Note (Signed)
Integrated Behavioral Health Visit via Telemedicine (Telephone)  10/06/2019 Aimee Davis [id number]   Session Start time: 11:30  Session End time: 12:00 Total time: 30 minutes  Referring Provider: Dr. Dareen Piano Type of Visit: Telephonic Patient location: Home [facility] Provider location: Office All persons participating in visit: Patient and [facility]  Confirmed patient's address: Yes  Confirmed patient's phone number: Yes  Any changes to demographics: No   Discussed confidentiality: Yes    The following statements were read to the patient and/or legal guardian that are established with the [facility] Provider.  "The purpose of this phone visit is to provide behavioral health care while limiting exposure to the coronavirus (COVID19).  There is a possibility of technology failure and discussed alternative modes of communication if that failure occurs."  "By engaging in this telephone visit, you consent to the provision of healthcare.  Additionally, you authorize for your insurance to be billed for the services provided during this telephone visit."   Patient and/or legal guardian consented to telephone visit: Yes   PRESENTING CONCERNS: Patient and/or family reports the following symptoms/concerns: sadness, anxiety, lack of motivation, and physical pain. Duration of problem: Since June; Severity of problem: mild  GOALS ADDRESSED: Patient will: 1.  Reduce symptoms of: anxiety and stress  2.  Increase knowledge and/or ability of: coping skills, healthy habits and stress reduction  3.  Demonstrate ability to: Increase healthy adjustment to current life circumstances and Increase adequate support systems for patient/family  INTERVENTIONS: Interventions utilized:  Motivational Interviewing, Behavioral Activation and Supportive Counseling Standardized Assessments completed: Not Needed  ASSESSMENT: Patient currently experiencing mild anxiety. Patient is happy today.  Patient reported  improvement with worry in the morning time. Patient has fears about getting Covid again, and concerns around the vaccine. Patient processed health issues since Covid, and wants to talk to her doctor about this. Patient also processed a numbness in her arm that is worrying her. A message was sent to the triage nurse to contact her.   Patient may benefit from counseling.   PLAN: 1. Follow up with behavioral health clinician on : two weeks.   Dessie Coma, [facility], [place]

## 2019-10-07 ENCOUNTER — Telehealth: Payer: Self-pay | Admitting: *Deleted

## 2019-10-07 NOTE — Telephone Encounter (Signed)
-----   Message from [place], [facility] sent at 10/06/2019 11:37 AM EST ----- Regarding: call patient after lunch Aimee Nasuti,  Ms. Basic reported that she is having pain from her fingers to her shoulder, and would like a call to see if she needs to come in to be seen.   Miquel Dunn

## 2019-10-07 NOTE — Telephone Encounter (Signed)
Have called pt to f/u on shoulder pain, lm for rtc

## 2019-10-14 ENCOUNTER — Other Ambulatory Visit: Payer: Self-pay | Admitting: Internal Medicine

## 2019-10-14 DIAGNOSIS — R1030 Lower abdominal pain, unspecified: Secondary | ICD-10-CM

## 2019-10-20 ENCOUNTER — Encounter: Payer: Self-pay | Admitting: Licensed Clinical Social Worker

## 2019-10-20 ENCOUNTER — Other Ambulatory Visit: Payer: Self-pay

## 2019-10-20 ENCOUNTER — Ambulatory Visit (INDEPENDENT_AMBULATORY_CARE_PROVIDER_SITE_OTHER): Payer: Medicare Other | Admitting: Licensed Clinical Social Worker

## 2019-10-20 DIAGNOSIS — F4320 Adjustment disorder, unspecified: Secondary | ICD-10-CM

## 2019-10-20 NOTE — BH Specialist Note (Signed)
Integrated Behavioral Health Visit via Telemedicine (Telephone)  10/20/2019 Aimee Davis [id number]   Session Start time: 2:30  Session End time: 3;00 Total time: 30 minutes  Referring Provider: Dr. Dareen Piano Type of Visit: Telephonic Patient location: Home [facility] Provider location: Office All persons participating in visit: Patient and [facility]  Confirmed patient's address: Yes  Confirmed patient's phone number: No  Any changes to demographics: Yes   Discussed confidentiality: Yes    The following statements were read to the patient and/or legal guardian that are established with the [facility] Provider.  "The purpose of this phone visit is to provide behavioral health care while limiting exposure to the coronavirus (COVID19).  There is a possibility of technology failure and discussed alternative modes of communication if that failure occurs."  "By engaging in this telephone visit, you consent to the provision of healthcare.  Additionally, you authorize for your insurance to be billed for the services provided during this telephone visit."   Patient and/or legal guardian consented to telephone visit: Yes   PRESENTING CONCERNS: Patient and/or family reports the following symptoms/concerns: sadness, anxiety, lack of motivation, and physical pain. Duration of problem: Since June; Severity of problem: mild  GOALS ADDRESSED: Patient will: 1.  Reduce symptoms of: anxiety and stress  2.  Increase knowledge and/or ability of: coping skills, healthy habits and stress reduction  3.  Demonstrate ability to: Increase healthy adjustment to current life circumstances and Increase adequate support systems for patient/family  INTERVENTIONS: Interventions utilized:  Motivational Interviewing, Brief CBT and Supportive Counseling Standardized Assessments completed: Not Needed  ASSESSMENT: Patient currently experiencing mild levels of anxiety and depression. Patient reported she has had  mild discomfort with her GI system, and processed how this impacts her when this happens. Patient reported that she has been slightly more down and anxious since the last session. Patient's hair is continuing to fall out, and that is concerning to her. Patient processed anxiety related to Covid.   Patient has noticed that she does better when she can be more active, and the weather is warmer. Patient has a harder time being active when it is rainy and cold outside. Patient is working towards finding activities that can bring her happiness.    Patient may benefit from counseling.  PLAN: 1. Follow up with behavioral health clinician on : two weeks.   Dessie Coma, [facility], [place]

## 2019-10-22 ENCOUNTER — Telehealth: Payer: Self-pay | Admitting: Gastroenterology

## 2019-10-22 NOTE — Telephone Encounter (Signed)
Left message on machine to call back  

## 2019-10-22 NOTE — Telephone Encounter (Signed)
Pt reported that she has a problem with belching and would like to discuss diet.

## 2019-10-23 NOTE — Telephone Encounter (Signed)
The pt states she would like to move her appt up sooner due to diarrhea episodes.  She has been scheduled for 2/8 with Amy.

## 2019-10-26 ENCOUNTER — Other Ambulatory Visit: Payer: Self-pay

## 2019-10-26 ENCOUNTER — Encounter: Payer: Self-pay | Admitting: Physician Assistant

## 2019-10-26 ENCOUNTER — Other Ambulatory Visit (INDEPENDENT_AMBULATORY_CARE_PROVIDER_SITE_OTHER): Payer: Medicare Other

## 2019-10-26 ENCOUNTER — Ambulatory Visit (INDEPENDENT_AMBULATORY_CARE_PROVIDER_SITE_OTHER): Payer: Medicare Other | Admitting: Physician Assistant

## 2019-10-26 VITALS — BP 100/60 | HR 76 | Temp 97.6°F | Ht 63.5 in | Wt 144.2 lb

## 2019-10-26 DIAGNOSIS — K5909 Other constipation: Secondary | ICD-10-CM | POA: Diagnosis not present

## 2019-10-26 DIAGNOSIS — Z8719 Personal history of other diseases of the digestive system: Secondary | ICD-10-CM

## 2019-10-26 DIAGNOSIS — R5383 Other fatigue: Secondary | ICD-10-CM

## 2019-10-26 DIAGNOSIS — K529 Noninfective gastroenteritis and colitis, unspecified: Secondary | ICD-10-CM | POA: Diagnosis not present

## 2019-10-26 LAB — CBC WITH DIFFERENTIAL/PLATELET
Basophils Absolute: 0 10*3/uL (ref 0.0–0.1)
Basophils Relative: 0.6 % (ref 0.0–3.0)
Eosinophils Absolute: 0 10*3/uL (ref 0.0–0.7)
Eosinophils Relative: 0.9 % (ref 0.0–5.0)
HCT: 38.1 % (ref 36.0–46.0)
Hemoglobin: 12.7 g/dL (ref 12.0–15.0)
Lymphocytes Relative: 26.3 % (ref 12.0–46.0)
Lymphs Abs: 0.7 10*3/uL (ref 0.7–4.0)
MCHC: 33.2 g/dL (ref 30.0–36.0)
MCV: 82.5 fl (ref 78.0–100.0)
Monocytes Absolute: 0.3 10*3/uL (ref 0.1–1.0)
Monocytes Relative: 10.9 % (ref 3.0–12.0)
Neutro Abs: 1.7 10*3/uL (ref 1.4–7.7)
Neutrophils Relative %: 61.3 % (ref 43.0–77.0)
Platelets: 224 10*3/uL (ref 150.0–400.0)
RBC: 4.62 Mil/uL (ref 3.87–5.11)
RDW: 12.3 % (ref 11.5–15.5)
WBC: 2.8 10*3/uL — ABNORMAL LOW (ref 4.0–10.5)

## 2019-10-26 LAB — URINALYSIS, ROUTINE W REFLEX MICROSCOPIC
Bilirubin Urine: NEGATIVE
Hgb urine dipstick: NEGATIVE
Ketones, ur: NEGATIVE
Nitrite: NEGATIVE
RBC / HPF: NONE SEEN (ref 0–?)
Specific Gravity, Urine: <=1.005 — AB (ref 1.000–1.030)
Total Protein, Urine: NEGATIVE
Urine Glucose: NEGATIVE
Urobilinogen, UA: 0.2 (ref 0.0–1.0)
pH: 5.5 (ref 5.0–8.0)

## 2019-10-26 LAB — COMPREHENSIVE METABOLIC PANEL
ALT: 20 U/L (ref 0–35)
AST: 21 U/L (ref 0–37)
Albumin: 4.4 g/dL (ref 3.5–5.2)
Alkaline Phosphatase: 56 U/L (ref 39–117)
BUN: 9 mg/dL (ref 6–23)
CO2: 27 mEq/L (ref 19–32)
Calcium: 10.2 mg/dL (ref 8.4–10.5)
Chloride: 99 mEq/L (ref 96–112)
Creatinine, Ser: 0.88 mg/dL (ref 0.40–1.20)
GFR: 78 mL/min (ref >60.00)
Glucose, Bld: 101 mg/dL — ABNORMAL HIGH (ref 70–99)
Potassium: 4 mEq/L (ref 3.5–5.1)
Sodium: 134 mEq/L — ABNORMAL LOW (ref 135–145)
Total Bilirubin: 0.4 mg/dL (ref 0.2–1.2)
Total Protein: 6.6 g/dL (ref 6.0–8.3)

## 2019-10-26 LAB — SEDIMENTATION RATE: Sed Rate: 2 mm/hr (ref 0–30)

## 2019-10-26 LAB — VITAMIN B12: Vitamin B-12: 894 pg/mL (ref 211–911)

## 2019-10-26 MED ORDER — LINACLOTIDE 72 MCG PO CAPS: 72.0000 ug | ORAL_CAPSULE | Freq: Every day | ORAL | 5 refills | Status: DC

## 2019-10-26 NOTE — Progress Notes (Signed)
Agree with assessment and plan as outlined.  

## 2019-10-26 NOTE — Patient Instructions (Addendum)
If you are age 66 or older, your body mass index should be between 23-30. Your Body mass index is 25.14 kg/m. If this is out of the aforementioned range listed, please consider follow up with your Primary Care Provider.  If you are age 77 or younger, your body mass index should be between 19-25. Your Body mass index is 25.14 kg/m. If this is out of the aformentioned range listed, please consider follow up with your Primary Care Provider.   Your provider has requested that you go to the basement level for lab work before leaving today. Press "B" on the elevator. The lab is located at the first door on the left as you exit the elevator.  We have sent the following medications to your pharmacy for you to pick up at your convenience: Start Bentyl 10 mg twice daily regularly. Hold Linzess until feeling better.  Will restart Linzess once feeling better at 72 mcg daily.    Increase fluid intake.   Bland Diet A bland diet consists of foods that are often soft and do not have a lot of fat, fiber, or extra seasonings. Foods without fat, fiber, or seasoning are easier for the body to digest. They are also less likely to irritate your mouth, throat, stomach, and other parts of your digestive system. A bland diet is sometimes called a BRAT diet. What is my plan? Your health care provider or food and nutrition specialist (dietitian) may recommend specific changes to your diet to prevent symptoms or to treat your symptoms. These changes may include:  Eating small meals often.  Cooking food until it is soft enough to chew easily.  Chewing your food well.  Drinking fluids slowly.  Not eating foods that are very spicy, sour, or fatty.  Not eating citrus fruits, such as oranges and grapefruit. What do I need to know about this diet?  Eat a variety of foods from the bland diet food list.  Do not follow a bland diet longer than needed.  Ask your health care provider whether you should take vitamins  or supplements. What foods can I eat? Grains  Hot cereals, such as cream of wheat. Rice. Bread, crackers, or tortillas made from refined white flour. Vegetables Canned or cooked vegetables. Mashed or boiled potatoes. Fruits  Bananas. Applesauce. Other types of cooked or canned fruit with the skin and seeds removed, such as canned peaches or pears. Meats and other proteins  Scrambled eggs. Creamy peanut butter or other nut butters. Lean, well-cooked meats, such as chicken or fish. Tofu. Soups or broths. Dairy Low-fat dairy products, such as milk, cottage cheese, or yogurt. Beverages  Water. Herbal tea. Apple juice. Fats and oils Mild salad dressings. Canola or olive oil. Sweets and desserts Pudding. Custard. Fruit gelatin. Ice cream. The items listed above may not be a complete list of recommended foods and beverages. Contact a dietitian for more options. What foods are not recommended? Grains Whole grain breads and cereals. Vegetables Raw vegetables. Fruits Raw fruits, especially citrus, berries, or dried fruits. Dairy Whole fat dairy foods. Beverages Caffeinated drinks. Alcohol. Seasonings and condiments Strongly flavored seasonings or condiments. Hot sauce. Salsa. Other foods Spicy foods. Fried foods. Sour foods, such as pickled or fermented foods. Foods with high sugar content. Foods high in fiber. The items listed above may not be a complete list of foods and beverages to avoid. Contact a dietitian for more information. Summary  A bland diet consists of foods that are often soft and do  not have a lot of fat, fiber, or extra seasonings.  Foods without fat, fiber, or seasoning are easier for the body to digest.  Check with your health care provider to see how long you should follow this diet plan. It is not meant to be followed for long periods. This information is not intended to replace advice given to you by your health care provider. Make sure you discuss any  questions you have with your health care provider. Document Revised: 10/02/2017 Document Reviewed: 10/02/2017 Elsevier Patient Education  2020 [employer].

## 2019-10-26 NOTE — Progress Notes (Signed)
Subjective:    Patient ID: Aimee Davis, female    DOB: 05/20/1956, 64 y.o.   MRN: [id number]  HPI Aimee Davis is a pleasant 64 year old African-American female, established with Dr. Adela Lank, who comes in today with complaints of feeling poorly over the past week.  She has history of GERD, IBS/constipation predominant and history of adenomatous colon polyps.  She was last seen in December 2020.  She was complaining of IBS type symptoms that time and was continued on Linzess 145 mcg daily and was using Bentyl as needed.  Testing for SIBO was discussed but I do not see where that was done. She says over the past week she has been feeling bad.  She said she ate some ginger and carrot soup from [employer] and ever since then has not felt well.  She developed nausea without vomiting, no fever or chills but has had ongoing intermittent cramping and pain in her mid lower abdomen.  She had diarrhea for 1 day and since then has been having some alternating bowel habits.  Her appetite has been decreased, she says she is not sure what to eat and her energy level has been very poor.  She denies any dysuria urgency or hematuria.  She stopped the Linzess, she has not been taking the Bentyl on a regular basis. Patient had colonoscopy in March 2020 with removal of 1 diminutive polyp which was a tubular adenoma.  Labs in November 2020 with celiac markers negative CBC and c-Met were unremarkable.   Review of Systems Pertinent positive and negative review of systems were noted in the above HPI section.  All other review of systems was otherwise negative.  Outpatient Encounter Medications as of 10/26/2019  Medication Sig  . AMBULATORY NON FORMULARY MEDICATION Medication Name: Wellness Formula- 1 tablet daily as needed  . Artificial Tear Ointment (DRY EYES OP) Apply 1 drop to eye daily as needed (dry eyes).  . Ascorbic Acid (VITAMIN C) 100 MG tablet Take 50 mg by mouth daily.  . B Complex-C (B-COMPLEX WITH VITAMIN C)  tablet Take 1 tablet by mouth daily.  . cetirizine (ZYRTEC) 10 MG tablet Take 10 mg by mouth daily.  . Cholecalciferol (VITAMIN D) 125 MCG (5000 UT) CAPS Take 1-2 tablets by mouth daily.  . DHA-EPA-Vitamin E (OMEGA-3 COMPLEX PO) Take 1 tablet by mouth daily.  Marland Kitchen dicyclomine (BENTYL) 10 MG capsule TAKE 1 CAPSULE BY MOUTH EVERY 8 HOURS AS NEEDED FOR SPASM  . famotidine (PEPCID) 20 MG tablet Take 1 tablet (20 mg total) by mouth 2 (two) times daily.  . ferrous sulfate 325 (65 FE) MG tablet Take 1 tablet (325 mg total) by mouth daily.  . Fluticasone-Salmeterol (ADVAIR DISKUS) 250-50 MCG/DOSE AEPB Inhale 1 puff into the lungs 2 (two) times daily.  . mometasone (NASONEX) 50 MCG/ACT nasal spray PLACE 2 SPRAYS IN EACH NOSTRIL ONCE DAILY AS NEEDED.  . Multiple Vitamin (MULTIVITAMIN) capsule Take 1 capsule by mouth daily.  . pantoprazole (PROTONIX) 40 MG tablet Take 1 tablet (40 mg total) by mouth daily.  . Probiotic Product (PROBIOTIC DAILY PO) Take by mouth.  Marland Kitchen PROVENTIL HFA 108 (90 Base) MCG/ACT inhaler INHALE 2 PUFFS INTO THE LUNGS EVERY 6 HOURS AS NEEDED FOR WHEEZING.  . [DISCONTINUED] linaclotide (LINZESS) 145 MCG CAPS capsule Take 1 capsule (145 mcg total) by mouth daily before breakfast.  . linaclotide (LINZESS) 72 MCG capsule Take 1 capsule (72 mcg total) by mouth daily before breakfast.  . [DISCONTINUED] Multiple Vitamins-Minerals (ZINC PO)  Take 1 tablet by mouth daily.  . [DISCONTINUED] 0.9 %  sodium chloride infusion    No facility-administered encounter medications on file as of 10/26/2019.   Allergies  Allergen Reactions  . Penicillins Other (See Comments)    REACTION: rash   Patient Active Problem List   Diagnosis Date Noted  . Belching 09/08/2019  . Abdominal pain, chronic, epigastric 09/08/2019  . Hair loss 07/07/2019  . Tinnitus 07/07/2019  . Macular rash 05/20/2019  . Neck pain 05/20/2019  . Skin macule 04/21/2019  . Seborrheic keratosis 04/21/2019  . History of colon polyps  06/24/2018  . Constitutional neutropenia (HCC) 02/21/2018  . Seasonal allergies 03/29/2017  . Healthcare maintenance 06/11/2013  . Primary insomnia 06/05/2012  . Weight loss 01/28/2009  . Asthma 01/31/2007  . Gastroesophageal reflux disease 11/14/2006  . Irritable bowel syndrome 11/14/2006   Social History   Socioeconomic History  . Marital status: Divorced    Spouse name: Not on file  . Number of children: Not on file  . Years of education: Not on file  . Highest education level: Not on file  Occupational History  . Not on file  Tobacco Use  . Smoking status: Former Smoker    Packs/day: 0.33    Years: 43.00    Pack years: 14.19    Types: Cigarettes    Quit date: 01/06/2013    Years since quitting: 6.8  . Smokeless tobacco: Never Used  Substance and Sexual Activity  . Alcohol use: No    Alcohol/week: 0.0 standard drinks  . Drug use: Not Currently    Comment: x 26 yrs.  . Sexual activity: Not on file    Comment: S/P PARTIAL HYSTERECTOMY  Other Topics Concern  . Not on file  Social History Narrative  . Not on file   Social Determinants of Health   Financial Resource Strain:   . Difficulty of Paying Living Expenses: Not on file  Food Insecurity:   . Worried About Programme researcher, broadcasting/film/video in the Last Year: Not on file  . Ran Out of Food in the Last Year: Not on file  Transportation Needs:   . Lack of Transportation (Medical): Not on file  . Lack of Transportation (Non-Medical): Not on file  Physical Activity:   . Days of Exercise per Week: Not on file  . Minutes of Exercise per Session: Not on file  Stress:   . Feeling of Stress : Not on file  Social Connections:   . Frequency of Communication with Friends and Family: Not on file  . Frequency of Social Gatherings with Friends and Family: Not on file  . Attends Religious Services: Not on file  . Active Member of Clubs or Organizations: Not on file  . Attends Banker Meetings: Not on file  . Marital  Status: Not on file  Intimate Partner Violence:   . Fear of Current or Ex-Partner: Not on file  . Emotionally Abused: Not on file  . Physically Abused: Not on file  . Sexually Abused: Not on file    Aimee Davis's family history includes Cancer in her sister; Diabetes in her father, mother, and sister; Heart disease in her father and mother; Hyperlipidemia in her father; Hypertension in her father, maternal aunt, maternal uncle, mother, paternal aunt, paternal uncle, and sister; Kidney disease in her father, mother, paternal aunt, paternal uncle, and sister; Mental illness in her brother; Stomach cancer in her maternal uncle; Stroke in her father.  Objective:    Vitals:   10/26/19 1410  BP: 100/60  Pulse: 76  Temp: 97.6 F (36.4 C)    Physical Exam Well-developed well-nourished  AA female in no acute distress.  Height, YNWGNF,621 BMI25.1  HEENT; nontraumatic normocephalic, EOMI, PER R LA, sclera anicteric. Oropharynx; not done Neck; supple, no JVD Cardiovascular; regular rate and rhythm with S1-S2, no murmur rub or gallop Pulmonary; Clear bilaterally Abdomen; soft,  Mildly tender mid lowe abdomen , no guarding, nondistended, no palpable mass or hepatosplenomegaly, bowel sounds are active Rectal;not done Skin; benign exam, no jaundice rash or appreciable lesions Extremities; no clubbing cyanosis or edema skin warm and dry Neuro/Psych; alert and oriented x4, grossly nonfocal mood and affect appropriate       Assessment & Plan:   Number one 64 year old African-American female with history of IBS-C with 1 week history of nausea, poor appetite, weakness, fatigue and intermittent crampy lower abdominal pain. Suspect she may have a viral syndrome/gastroenteritis, superimposed on IBS. 2.  GERD 3.  Adenomatous colon polyps-up-to-date with colonoscopy.  Plan; patient is encouraged to push fluids, and start with a bland soft diet. Encouraged her to take the dicyclomine 10 mg p.o.  twice daily regularly over the next couple of weeks. Hold Linzess for now and when restarted start at 72 mcg p.o. daily as she says it was giving her problems with diarrhea.  We gave her samples today and prescription sent. CBC with differential, BMET,t sed rate, B12, and UA today    Lisette Mancebo S Aryn Safran PA-C 10/26/2019   Cc: Katherine Roan, MD

## 2019-10-29 ENCOUNTER — Ambulatory Visit: Payer: Medicare Other | Admitting: Physician Assistant

## 2019-11-02 ENCOUNTER — Ambulatory Visit: Payer: Medicare Other | Admitting: Gastroenterology

## 2019-11-03 ENCOUNTER — Other Ambulatory Visit: Payer: Self-pay

## 2019-11-03 ENCOUNTER — Ambulatory Visit (INDEPENDENT_AMBULATORY_CARE_PROVIDER_SITE_OTHER): Payer: Medicare Other | Admitting: Licensed Clinical Social Worker

## 2019-11-03 ENCOUNTER — Encounter: Payer: Self-pay | Admitting: Licensed Clinical Social Worker

## 2019-11-03 DIAGNOSIS — F4320 Adjustment disorder, unspecified: Secondary | ICD-10-CM

## 2019-11-03 NOTE — BH Specialist Note (Signed)
Integrated Behavioral Health Visit via Telemedicine (Telephone)  11/03/2019 DAMIAN BALDASSARRE [id number]   Session Start time: 1:30  Session End time: 2:05 Total time: 35  minutes  Referring Provider: Dr. Dareen Piano Type of Visit: Telephonic Patient location: Home [facility] Provider location: Office All persons participating in visit: Patient and [facility]  Confirmed patient's address: Yes  Confirmed patient's phone number: Yes  Any changes to demographics: No   Discussed confidentiality: Yes    The following statements were read to the patient and/or legal guardian that are established with the [facility] Provider.  "The purpose of this phone visit is to provide behavioral health care while limiting exposure to the coronavirus (COVID19).  There is a possibility of technology failure and discussed alternative modes of communication if that failure occurs."  "By engaging in this telephone visit, you consent to the provision of healthcare.  Additionally, you authorize for your insurance to be billed for the services provided during this telephone visit."   Patient and/or legal guardian consented to telephone visit: Yes   PRESENTING CONCERNS: Patient and/or family reports the following symptoms/concerns: sadness, anxiety, lack of motivation, and physical pain. Duration of problem: Since June; Severity of problem: mild  GOALS ADDRESSED: Patient will: 1.  Reduce symptoms of: anxiety and stress  2.  Increase knowledge and/or ability of: coping skills and healthy habits  3.  Demonstrate ability to: Increase healthy adjustment to current life circumstances and Increase adequate support systems for patient/family  INTERVENTIONS: Interventions utilized:  Motivational Interviewing, Mindfulness or Psychologist, educational, Brief CBT and Supportive Counseling Standardized Assessments completed: Not Needed  ASSESSMENT: Patient currently experiencing mild anxiety and depression. Patient has been sick  on her stomach, and she is currently having anxiety around the cause of her stomach pain. Patient is concerned about the procedure she might have to go through in order to identify the cause/prognosis of her stomach issues. Patient is trying to give her mind time to rest in order to reduce her stress levels.   Patient may benefit from one week.  PLAN: 1. Follow up with behavioral health clinician on : one week.   Dessie Coma, [facility], [place]

## 2019-11-04 ENCOUNTER — Telehealth: Payer: Self-pay | Admitting: Dietician

## 2019-11-04 NOTE — Telephone Encounter (Signed)
Supportive conversation to help advise her about foods she can eat that are nutritious and will not bother her stomach, while maintaining her weight. I suggested she take the multivitamin daily and other vitamins recommended by her doctor, but hold other supplements until approved by her doctor.

## 2019-11-07 NOTE — Progress Notes (Deleted)
     11/07/2019 EDWINA PRESCHER [id number] [birth year]   Chief Complaint:  History of Present Illness:  Aimee Davis is a 64 year old female with a past medical history of asthma, COPD, arthritis and + TB skin test.   Colonoscopy by Dr. Havery Moros 11/26/2018: - One diminutive tubular adenomatous polyp in the cecum was removed.  - Tortuous colon. - Internal hemorrhoids. - The examination was otherwise normal. - Recall colonoscopy 7 years.   Past Medical History:  Diagnosis Date  . Allergy    seasonal, PCN, antidepressants, bentyl  . Arthritis   . Asthma   . Carpal tunnel syndrome on both sides    right worse than left 2008  . Cataract 06/11/2013  . COPD (chronic obstructive pulmonary disease) ([place])   . COVID-19 virus infection 06/18/2016  . Eczema 05/02/2018  . GERD (gastroesophageal reflux disease)   . Hyperlipidemia    LDL 108 JULY 2013  . IBS (irritable bowel syndrome)   . Multiple nevi 03/01/2015  . Pneumonia   . Positive TB test    From [facility] 2009 note : Recent F/u at [facility]. CXRAy negative.  Positive TST in 2007 (76mm).  Unable to tolerate INH due to hepatotoxicity     Current Medications, Allergies, Past Medical History, Past Surgical History, Family History and Social History were reviewed in [employer] record.   Physical Exam: There were no vitals taken for this visit. General: Well developed , w   ***female in no acute distress. Head: Normocephalic and atraumatic. Eyes:  No scleral icterus. Conjunctiva pink . Ears: Normal auditory acuity. Lungs: Clear throughout to auscultation. Heart: Regular rate and rhythm, no murmur. Abdomen: Soft, nontender and nondistended. No masses or hepatomegaly. Normal bowel sounds x 4 quadrants.  Rectal: *** Musculoskeletal: Symmetrical with no gross deformities. Extremities: No edema. Neurological: Alert oriented x 4. No focal deficits.  Psychological:  Alert and cooperative. Normal mood and  affect  Assessment and Recommendations:  41. 64 year old female   2. History of colon polyps

## 2019-11-09 ENCOUNTER — Ambulatory Visit: Payer: Medicare Other | Admitting: Nurse Practitioner

## 2019-11-10 ENCOUNTER — Ambulatory Visit (INDEPENDENT_AMBULATORY_CARE_PROVIDER_SITE_OTHER): Payer: Medicare Other | Admitting: Licensed Clinical Social Worker

## 2019-11-10 DIAGNOSIS — F4320 Adjustment disorder, unspecified: Secondary | ICD-10-CM

## 2019-11-10 NOTE — BH Specialist Note (Signed)
Integrated Behavioral Health Visit via Telemedicine (Telephone)  11/10/2019 Aimee Davis [id number]   Session Start time: 2:33  Session End time: 3:03 Total time: 30 minutes  Referring Provider: Dr. Dareen Piano Type of Visit: Telephonic Patient location: Home [facility] Provider location: Office All persons participating in visit: Patient and [facility]  Confirmed patient's address: Yes  Confirmed patient's phone number: Yes  Any changes to demographics: No   Discussed confidentiality: Yes    The following statements were read to the patient and/or legal guardian that are established with the [facility] Provider.  "The purpose of this phone visit is to provide behavioral health care while limiting exposure to the coronavirus (COVID19).  There is a possibility of technology failure and discussed alternative modes of communication if that failure occurs."  "By engaging in this telephone visit, you consent to the provision of healthcare.  Additionally, you authorize for your insurance to be billed for the services provided during this telephone visit."   Patient and/or legal guardian consented to telephone visit: Yes   PRESENTING CONCERNS: Patient and/or family reports the following symptoms/concerns: anxiety, lack of motivation, recent feelings of being stuck, sadness, and physical pain. Duration of problem: Since June; Severity of problem: moderate  GOALS ADDRESSED: Patient will: 1.  Reduce symptoms of: anxiety, depression, obsessions and stress  2.  Increase knowledge and/or ability of: coping skills and healthy habits  3.  Demonstrate ability to: Increase healthy adjustment to current life circumstances and Increase adequate support systems for patient/family  INTERVENTIONS: Interventions utilized:  Motivational Interviewing, Mindfulness or Relaxation Training, Brief CBT and Supportive Counseling Standardized Assessments completed: assessed for SI, HI, and  self-harm.  ASSESSMENT: Patient currently experiencing ongoing mild-moderate levels of anxiety and depression. Patient reported that she feels "stuck" and is currently "lacking control over her life'. Patient reported that she feels alone frequently, and she has challenges working through loneliness. Patient's anxiety is elevated, and she was having difficulty getting focused/goal-oriented. Patient plans to go home today, and try to implement some healthy coping skills.   Patient may benefit from counseling.  PLAN: 1. Follow up with behavioral health clinician on : one week.   Dessie Coma, [facility], [place]

## 2019-11-11 ENCOUNTER — Encounter: Payer: Self-pay | Admitting: Licensed Clinical Social Worker

## 2019-11-13 ENCOUNTER — Other Ambulatory Visit: Payer: Self-pay

## 2019-11-13 ENCOUNTER — Encounter: Payer: Self-pay | Admitting: Nurse Practitioner

## 2019-11-13 ENCOUNTER — Ambulatory Visit (INDEPENDENT_AMBULATORY_CARE_PROVIDER_SITE_OTHER): Payer: Medicare Other | Admitting: Nurse Practitioner

## 2019-11-13 VITALS — BP 114/60 | HR 60 | Temp 98.2°F | Ht 63.5 in | Wt 142.2 lb

## 2019-11-13 DIAGNOSIS — R1033 Periumbilical pain: Secondary | ICD-10-CM

## 2019-11-13 MED ORDER — HYOSCYAMINE SULFATE 0.125 MG SL SUBL: 0.1250 mg | SUBLINGUAL_TABLET | Freq: Every day | SUBLINGUAL | 0 refills | Status: DC

## 2019-11-13 NOTE — Patient Instructions (Addendum)
If you are age 66 or older, your body mass index should be between 23-30. Your Body mass index is 24.8 kg/m. If this is out of the aforementioned range listed, please consider follow up with your Primary Care Provider.  If you are age 21 or younger, your body mass index should be between 19-25. Your Body mass index is 24.8 kg/m. If this is out of the aformentioned range listed, please consider follow up with your Primary Care Provider.   1. Pepcid 20mg  1 tablet at bedtime 2 Hyosycomaine 0.125mg  under the tongue at bedtime.  Call Colleen in 1-2 weeks with an update. [phone]  Avoid greasy foods  Due to recent changes in healthcare laws, you may see the results of your imaging and laboratory studies on MyChart before your provider has had a chance to review them.  We understand that in some cases there may be results that are confusing or concerning to you. Not all laboratory results come back in the same time frame and the provider may be waiting for multiple results in order to interpret others.  Please give [place] 48 hours in order for your provider to thoroughly review all the results before contacting the office for clarification of your results.

## 2019-11-13 NOTE — Progress Notes (Signed)
11/13/2019 Aimee Davis [id number] 17-Oct-1955   Chief Complaint: Belching, pain below the belly button  History of Present Illness: Aimee Davis is a 64 year old female with a past medical history of arthritis, asthma, COPD, GERD, IBS and colon polyps. Past hysterectomy. She was last seen in our office on 10/26/2019 by Nicoletta Ba PA-C. At that time, she was advised to hold Linzess as she had one day of diarrhea and to restart Linzess at 4mcg daily when tolerated and  to take Dicyclomine 10mg  po bid. Labs were ordered. WBC 2.6. Hg 12.7. CMP was normal.  She presents today for further follow up. She complains that that her "stomach doesn't feel good"  and she is having increased burping. No heartburn or dysphagia. She is taking Protonix 40mg  once or twice weekly, not daily as previously prescribed. She is taking Pepcid once or twice weekly. She possible had an EGD around 2009, records not found. She was last seen at [facility] 10/21/2017 by gastroenterologist Dr. Winifred Olive. At that time, an EGD and colonoscopy were ordered  but she was a no show for these procedures on 05/22/2018. She has chronic cramping abdominal pain below the umbilicus since at least 2013. She has been evaluated by multiple GI's at [facility] 2013 - 2019 who assessed her abdominal pain was IBS vs musculoskeletal vs SIBO. Abd/pelvic CT 07/2012 without significant findings. Currently, she is having spasms to the area below her umbilicus which occurs upon awakening in the morning before she gets out of bed and occurs 3 to 4 days weekly. Her abdominal pain has been more noticeable over the past 4 weeks.  She has lost 2 lbs over the past 3 weeks.  Decreased appetite. No fever, sweats or chills. She has chronic constipation. She is passing strings of formed stool daily. She is taking Linzess 72 mcg daily. Her most recent colonoscopy was done 11/26/2018 by Dr. Havery Moros, one tubular adenomatous polyp was removed from the cecum. She feels  "negative" in the morning. She denies feeling depressed. She tested + for SARS COVID 19 on 02/24/2019 seen in the ED on 03/05/2019 with cough/SOB/asthma exacerbation. Chest xray without infiltrates. Treated with IV steroids with asthma exacerbation.   CBC Latest Ref Rng & Units 10/26/2019 08/17/2019 04/01/2019  WBC 4.0 - 10.5 K/uL 2.8(L) 3.0(L) 3.0(L)  Hemoglobin 12.0 - 15.0 g/dL 12.7 13.1 12.0  Hematocrit 36.0 - 46.0 % 38.1 38.7 36.4  Platelets 150.0 - 400.0 K/uL 224.0 254 275   CMP Latest Ref Rng & Units 10/26/2019 08/17/2019 04/01/2019  Glucose 70 - 99 mg/dL 101(H) 97 94  BUN 6 - 23 mg/dL 9 12 16   Creatinine 0.40 - 1.20 mg/dL 0.88 1.05(H) 0.83  Sodium 135 - 145 mEq/L 134(L) 144 138  Potassium 3.5 - 5.1 mEq/L 4.0 4.5 4.9  Chloride 96 - 112 mEq/L 99 105 98  CO2 19 - 32 mEq/L 27 19(L) 22  Calcium 8.4 - 10.5 mg/dL 10.2 10.7(H) 10.2  Total Protein 6.0 - 8.3 g/dL 6.6 6.5 6.1  Total Bilirubin 0.2 - 1.2 mg/dL 0.4 0.4 0.3  Alkaline Phos 39 - 117 U/L 56 65 68  AST 0 - 37 U/L 21 22 17   ALT 0 - 35 U/L 20 17 16   tTg IgA and Antigliadin IgA ab: negative 08/17/2019.  Colonoscopy 11/26/2018 by Dr. Havery Moros:  - One diminutive polyp in the cecum, removed with a cold snare. - Tortuous colon. - Internal hemorrhoids. - The examination was otherwise normal. - Prep  was adequate.  - Recall colonoscopy 7 years.   Colonoscopy 11/18/2012 by Dr. Eddie Dibbles Schmeltzer: Normal colonoscopy, fair prep.   Current Outpatient Medications on File Prior to Visit  Medication Sig Dispense Refill  . Artificial Tear Ointment (DRY EYES OP) Apply 1 drop to eye daily as needed (dry eyes).    . Ascorbic Acid (VITAMIN C) 100 MG tablet Take 50 mg by mouth daily.    . B Complex-C (B-COMPLEX WITH VITAMIN C) tablet Take 1 tablet by mouth daily.    . cetirizine (ZYRTEC) 10 MG tablet Take 10 mg by mouth daily.    . Cholecalciferol (VITAMIN D) 125 MCG (5000 UT) CAPS Take 1-2 tablets by mouth daily.    . famotidine (PEPCID) 20 MG tablet  Take 1 tablet (20 mg total) by mouth 2 (two) times daily. (Patient taking differently: Take 20 mg by mouth as needed. ) 60 tablet 0  . Fluticasone-Salmeterol (ADVAIR DISKUS) 250-50 MCG/DOSE AEPB Inhale 1 puff into the lungs 2 (two) times daily. 60 each 6  . linaclotide (LINZESS) 72 MCG capsule Take 1 capsule (72 mcg total) by mouth daily before breakfast. (Patient taking differently: Take 72 mcg by mouth daily before breakfast. HASNT STARTED YET) 30 capsule 5  . mometasone (NASONEX) 50 MCG/ACT nasal spray PLACE 2 SPRAYS IN EACH NOSTRIL ONCE DAILY AS NEEDED. 17 g 6  . OVER THE COUNTER MEDICATION MICROTEX VM - FOOD NUTRIENT COMPLEX  2 DAY    . OVER THE COUNTER MEDICATION EO MEGA Essential Oil - 2 daily    . Probiotic Product (PROBIOTIC DAILY PO) Take by mouth.    Marland Kitchen PROVENTIL HFA 108 (90 Base) MCG/ACT inhaler INHALE 2 PUFFS INTO THE LUNGS EVERY 6 HOURS AS NEEDED FOR WHEEZING. 3 each 5  . pantoprazole (PROTONIX) 40 MG tablet Take 1 tablet (40 mg total) by mouth daily. (Patient taking differently: Take 40 mg by mouth as needed. ) 90 tablet 11   No current facility-administered medications on file prior to visit.    Current Medications, Allergies, Past Medical History, Past Surgical History, Family History and Social History were reviewed in [employer] record.   Physical Exam: BP 114/60   Pulse 60   Temp 98.2 F (36.8 C)   Ht 5' 3.5" (1.613 m)   Wt 142 lb 4 oz (64.5 kg)   BMI 24.80 kg/m  WT 143 lbs 10/21/2017  General: Well developed 64 year old female in no acute distress. Head: Normocephalic and atraumatic. Eyes:  No scleral icterus. Conjunctiva pink . Ears: Normal auditory acuity. Lungs: Clear throughout to auscultation. Heart: Regular rate and rhythm, no murmur. Abdomen: Soft, nontender and nondistended. No masses or hepatomegaly. Normal bowel sounds x 4 quadrants.  Rectal: Deferred.  Musculoskeletal: Symmetrical with no gross deformities. Extremities: No  edema. Neurological: Alert oriented x 4. No focal deficits.  Psychological:  Alert and cooperative. Normal mood and affect  Assessment and Recommendations:  71. 64 year old female with chronic cramping abdominal pain below the umbilicus which occurs 3 to 4 mornings prior to getting out of bed. IBS vs possible adhesions from prior hysterectomy.  -Hyoscyamine 0.125mg  SL one tab under tongue at bed time. -Patient to call me in one week, if she continues to have abdominal pain +/- weight loss I will order an abd/pelvic CT with enterography (abdominal pan is localized below the umbilicus).   2. History of GERD with vague c/o "my stomach doesn't feel well" and belching.  Maternal uncle with history of  stomach cancer. Possibly had EGD done in 2009?  -Continue Pantoprazole 40mg  in the am and take Pepcid 20mg  at bed time. -Consider near future EGD, further recommendations from Dr. Havery Moros.  3. Constipation -Continue Linzess   4. History of colon polyps. -Next colonoscopy due 11/2025  5. Chronic neutropenia unclear etiology. WBC  -Consider hematology evaluation if not previously done

## 2019-11-16 ENCOUNTER — Ambulatory Visit (INDEPENDENT_AMBULATORY_CARE_PROVIDER_SITE_OTHER): Payer: Medicare Other | Admitting: Internal Medicine

## 2019-11-16 ENCOUNTER — Other Ambulatory Visit: Payer: Self-pay

## 2019-11-16 ENCOUNTER — Encounter: Payer: Self-pay | Admitting: Internal Medicine

## 2019-11-16 VITALS — BP 110/53 | HR 68 | Temp 98.2°F | Ht 63.5 in | Wt 141.1 lb

## 2019-11-16 DIAGNOSIS — R110 Nausea: Secondary | ICD-10-CM

## 2019-11-16 DIAGNOSIS — R790 Abnormal level of blood mineral: Secondary | ICD-10-CM

## 2019-11-16 DIAGNOSIS — R0602 Shortness of breath: Secondary | ICD-10-CM

## 2019-11-16 DIAGNOSIS — Z8616 Personal history of COVID-19: Secondary | ICD-10-CM

## 2019-11-16 DIAGNOSIS — R5382 Chronic fatigue, unspecified: Secondary | ICD-10-CM | POA: Diagnosis not present

## 2019-11-16 DIAGNOSIS — R634 Abnormal weight loss: Secondary | ICD-10-CM

## 2019-11-16 DIAGNOSIS — G2581 Restless legs syndrome: Secondary | ICD-10-CM

## 2019-11-16 DIAGNOSIS — Z6824 Body mass index (BMI) 24.0-24.9, adult: Secondary | ICD-10-CM

## 2019-11-16 NOTE — Assessment & Plan Note (Addendum)
Patient reports chronic fatigue starting in June, improving slightly in January, but then worsening.  10 pound weight loss over past year, cancer screenings up-to-date.  Patient reports decreased interest in activities, decreased energy, decreased appetite, slow movement, denies suicidal ideation.  Prior work-up including vitamin D, B12, DHEA, testosterone level, celiac, TSH, CMP, CBC, was without significant abnormality to explain patient's symptoms.  Patient did have low-normal ferritin of 42 in October 2020.  Given the symptoms started after COVID-19 infection, symptoms could be post-COVID fatigue.  Discussed with patient the option of treating depressive symptoms empirically with antidepressant medication, as this has been shown efficacious in chronic fatigue, even without MDD.  Patient reports approximately 10 years ago she was treated with unknown antidepressant medication, had difficulty breathing after second dosage, continued take medication and experience weight loss, agitation.  Patient wishes to avoid antidepressant medications at this time, but states is open to considering again in the future.  Plan:  *We will repeat iron studies to include ferritin, iron, TIBC.  If low, will set patient up for outpatient IV iron transfusion. *If iron studies are within normal limits or patient is without benefit from IV iron transfusion, patient may benefit from treatment with antidepressant medication.  We will consider mirtazapine at that time given beneficial side effect of weight gain.

## 2019-11-16 NOTE — Progress Notes (Signed)
Internal Medicine Clinic Attending  Case discussed with Dr. MacLean at the time of the visit.  We reviewed the resident's history and exam and pertinent patient test results.  I agree with the assessment, diagnosis, and plan of care documented in the resident's note.    

## 2019-11-16 NOTE — Assessment & Plan Note (Signed)
Patient describes aching pain in her legs, occurring primarily at night.  Symptoms may be secondary restless leg syndrome which could be consistent with iron deficiency. *We will check iron panel

## 2019-11-16 NOTE — Patient Instructions (Addendum)
You were seen in the clinic for fatigue, decreased appetite, and weight loss. We are going to check your iron levels today as low iron can contribute to your fatigue and also cause pain in your legs.  I will call you with the results of these studies.  If your iron is low, you may benefit from an IV iron infusion

## 2019-11-16 NOTE — Progress Notes (Signed)
   CC: Fatigue, weight loss  HPI:  [name] is a 64 y.o. female with past medical history as below who presents for follow-up on chronic fatigue and weight loss.  Past Medical History:  Diagnosis Date  . Allergy    seasonal, PCN, antidepressants, bentyl  . Arthritis   . Asthma   . Carpal tunnel syndrome on both sides    right worse than left 2008  . Cataract 06/11/2013  . COPD (chronic obstructive pulmonary disease) ([place])   . COVID-19 virus infection 06/18/2016  . Eczema 05/02/2018  . GERD (gastroesophageal reflux disease)   . Hyperlipidemia    LDL 108 JULY 2013  . IBS (irritable bowel syndrome)   . Multiple nevi 03/01/2015  . Pneumonia   . Positive TB test    From [facility] 2009 note : Recent F/u at [facility]. CXRAy negative.  Positive TST in 2007 (25mm).  Unable to tolerate INH due to hepatotoxicity   Review of Systems:   Review of Systems  Constitutional: Negative for chills and fever.  Respiratory: Positive for shortness of breath. Negative for cough.   Cardiovascular: Negative for chest pain.  Gastrointestinal: Positive for nausea. Negative for vomiting.  All other systems reviewed and are negative.    Physical Exam:  Vitals:   11/16/19 1023  BP: (!) 110/53  Pulse: 68  Temp: 98.2 F (36.8 C)  TempSrc: Oral  SpO2: 98%  Weight: 141 lb 1.6 oz (64 kg)  Height: 5' 3.5" (1.613 m)   Physical Exam  Constitutional: She is well-developed, well-nourished, and in no distress.  HENT:  Head: Normocephalic and atraumatic.  Eyes: EOM are normal. Right eye exhibits no discharge. Left eye exhibits no discharge.  Neck: No tracheal deviation present.  Cardiovascular: Normal rate and regular rhythm. Exam reveals no gallop and no friction rub.  No murmur heard. Pulmonary/Chest: Effort normal and breath sounds normal. No respiratory distress. She has no wheezes. She has no rales.  Abdominal: Soft.  Musculoskeletal:        General: No tenderness, deformity or edema. Normal  range of motion.     Cervical back: Normal range of motion.     Comments: No tenderness to palpation of ankles, normal range of motion  Neurological: She is alert. Coordination normal.  Skin: Skin is warm and dry. No rash noted. She is not diaphoretic. No erythema.  Psychiatric: Memory and judgment normal.     Assessment & Plan:   See Encounters Tab for problem based charting.  Patient discussed with Dr. Lynnae January

## 2019-11-16 NOTE — Progress Notes (Signed)
Agree with assessment and plan as outlined. If symptoms persist despite regimen as outlined would pursue EGD. Thanks

## 2019-11-17 ENCOUNTER — Encounter: Payer: Self-pay | Admitting: Licensed Clinical Social Worker

## 2019-11-17 ENCOUNTER — Ambulatory Visit (INDEPENDENT_AMBULATORY_CARE_PROVIDER_SITE_OTHER): Payer: Medicare Other | Admitting: Licensed Clinical Social Worker

## 2019-11-17 ENCOUNTER — Other Ambulatory Visit: Payer: Self-pay

## 2019-11-17 DIAGNOSIS — F4320 Adjustment disorder, unspecified: Secondary | ICD-10-CM

## 2019-11-17 LAB — IRON,TIBC AND FERRITIN PANEL
Ferritin: 41 ng/mL (ref 15–150)
Iron Saturation: 29 % (ref 15–55)
Iron: 104 ug/dL (ref 27–139)
Total Iron Binding Capacity: 360 ug/dL (ref 250–450)
UIBC: 256 ug/dL (ref 118–369)

## 2019-11-17 NOTE — BH Specialist Note (Signed)
Integrated Behavioral Health Visit via Telemedicine (Telephone)  11/17/2019 EMBYR SIM [id number]   Session Start time: 2:55  Session End time: 3;15 Total time: 20 minutes  Referring Provider: Dr. Dareen Piano Type of Visit: Telephonic Patient location: Home [facility] Provider location: Office All persons participating in visit: Patient and [facility]  Confirmed patient's address: Yes  Confirmed patient's phone number: Yes  Any changes to demographics: No   Discussed confidentiality: Yes    The following statements were read to the patient and/or legal guardian that are established with the [facility] Provider.  "The purpose of this phone visit is to provide behavioral health care while limiting exposure to the coronavirus (COVID19).  There is a possibility of technology failure and discussed alternative modes of communication if that failure occurs."  "By engaging in this telephone visit, you consent to the provision of healthcare.  Additionally, you authorize for your insurance to be billed for the services provided during this telephone visit."   Patient and/or legal guardian consented to telephone visit: Yes   PRESENTING CONCERNS: Patient and/or family reports the following symptoms/concerns: anxiety, lack of motivation, recent feelings of being stuck, sadness, and physical pain. Duration of problem: since June; Severity of problem: moderate  GOALS ADDRESSED: Patient will: 1.  Reduce symptoms of: anxiety, depression, obsessions and stress  2.  Increase knowledge and/or ability of: coping skills, healthy habits and stress reduction  3.  Demonstrate ability to: Increase healthy adjustment to current life circumstances and Increase adequate support systems for patient/family  INTERVENTIONS: Interventions utilized:  Mindfulness or Relaxation Training, Brief CBT and Supportive Counseling Standardized Assessments completed: assessed for SI, HI, and self-harm.  ASSESSMENT: Patient  currently experiencing mild-moderate levels of anxiety and depression. Patient is concerned that she is still experiencing residual side-effects from Covid. Patient feels that her family does not understand. Patient described how Covid impacted her in the past, and continues to be effected in the present day. Patient is dealing with loneliness, and finds this feeling to be challenging.   Patient may benefit from counseling.  PLAN: 1. Follow up with behavioral health clinician on : one week.  Dessie Coma, [facility], [place]

## 2019-11-23 ENCOUNTER — Telehealth: Payer: Self-pay | Admitting: Internal Medicine

## 2019-11-23 NOTE — Telephone Encounter (Signed)
Pt reporting her energy level is really low and is requesting her Results from her last test.

## 2019-11-24 ENCOUNTER — Other Ambulatory Visit: Payer: Self-pay | Admitting: Internal Medicine

## 2019-11-24 DIAGNOSIS — F321 Major depressive disorder, single episode, moderate: Secondary | ICD-10-CM

## 2019-11-24 DIAGNOSIS — F32A Depression, unspecified: Secondary | ICD-10-CM

## 2019-11-24 DIAGNOSIS — F419 Anxiety disorder, unspecified: Secondary | ICD-10-CM

## 2019-11-24 DIAGNOSIS — F329 Major depressive disorder, single episode, unspecified: Secondary | ICD-10-CM

## 2019-11-24 NOTE — Progress Notes (Signed)
I discussed results of iron studies with patient.  Counseled that iron studies are normal and not likely the cause of her chronic fatigue. Patient is worried about her depression and requesting referral to psychiatry for further evaluation.  I discussed the option of starting her on mirtazapine to help with her appetite and depression.  Given patient's history of reaction to past antidepressants, patient wants to talk with psychiatrist before initiating pharmaceutical therapy.  Patient denies thoughts of hurting herself.  Plan: *Referral placed to psychiatry

## 2019-11-24 NOTE — Telephone Encounter (Signed)
Called and discussed results with patient.

## 2019-11-27 ENCOUNTER — Ambulatory Visit: Payer: Medicare Other | Attending: Internal Medicine

## 2019-11-27 DIAGNOSIS — Z23 Encounter for immunization: Secondary | ICD-10-CM

## 2019-11-27 NOTE — Progress Notes (Signed)
   Covid-19 Vaccination Clinic  Name:  Aimee Davis    MRN: [id number] DOB: March 20, 1956  11/27/2019  [name] was observed post Covid-19 immunization for 15 minutes without incident. She was provided with Vaccine Information Sheet and instruction to access the V-Safe system.   [name] was instructed to call 911 with any severe reactions post vaccine: Marland Kitchen Difficulty breathing  . Swelling of face and throat  . A fast heartbeat  . A bad rash all over body  . Dizziness and weakness   Immunizations Administered    Name Date Dose VIS Date Route   Pfizer COVID-19 Vaccine 11/27/2019  9:32 AM 0.3 mL 08/28/2019 Intramuscular   Manufacturer: [place]   Lot: [id number]   [place]: [id number]

## 2019-11-30 ENCOUNTER — Telehealth: Payer: Self-pay | Admitting: Nurse Practitioner

## 2019-11-30 NOTE — Telephone Encounter (Signed)
Called and spoke with patient-Patient reports she is still losing weight because of her decreased appetite and her anxiety; patient reports she is feeling weak and not sure if the Pepcid and the Protonix could also be causing her symptoms; patient reports she is going to "call my PCP and get them to check on me and if my symptoms get worse I will go to the ER"; patient has "through my process of elimination figured out that maybe the Pepcid or the Protonix is causing my symptoms too-I am not going to take them anymore until someone figures out what is wrong with me"; Levsin added to patient's allergy list per provider's recommendations;  Patient advised to go immediately to the ER if her symptoms worsen because she "wants to check with my PCP before I have to go to the ER"; patient denies SHOB/distress/slurred speech/confusion at this time;  Patient advised to call back to the office at [phone] should questions/concerns arise;  Patient verbalized understanding of information/instructions;

## 2019-11-30 NOTE — Telephone Encounter (Signed)
Called and spoke with patient- patient reports she had had weakness, urinary urgency, hallucinations at 0530 every morning, slurred speech, insomnia (has not has a "good nights sleeps in 3 days"), anxiety, and confusion since starting this medication -patient is wanting to know if there is an alternative medication to Levsin-please advise (patient was advised not to take any more of the Levsin until she receives notification from this office)

## 2019-11-30 NOTE — Telephone Encounter (Signed)
Bre, pls call [name], stop Levsin now. Add this medication to allergy list. If she is still having slurred speech and/or confusion send her to the ED for evaluation, make sure she is not having stroke symptoms. Pls let me know outcome. No alternative medication for now, not until I know all of these symptoms are gone. In a few days she can try Ibgard 1 po bid. I also think it is a good idea for her to contact her PCP and get checked, make sure all of these symptoms have resolved. Thx

## 2019-11-30 NOTE — Telephone Encounter (Signed)
Bre, thank you for the update. Regarding her ongoing GI, Dr. Havery Moros recommended patient to have an EGD if her symptoms continued. Please schedule her for an EGD with Dr. Havery Moros. If she does not wish to schedule an EGD then please schedule her for an office appt with Dr. Havery Moros. Thx

## 2019-11-30 NOTE — Telephone Encounter (Signed)
Pt reported that Levsin is causing her to have confusion, anxiety, memory loss and slurred speech.  Please call back to discuss.

## 2019-12-01 ENCOUNTER — Ambulatory Visit (INDEPENDENT_AMBULATORY_CARE_PROVIDER_SITE_OTHER): Payer: Medicare Other | Admitting: Internal Medicine

## 2019-12-01 ENCOUNTER — Ambulatory Visit (INDEPENDENT_AMBULATORY_CARE_PROVIDER_SITE_OTHER): Payer: Medicare Other | Admitting: Licensed Clinical Social Worker

## 2019-12-01 ENCOUNTER — Encounter: Payer: Self-pay | Admitting: Internal Medicine

## 2019-12-01 DIAGNOSIS — K589 Irritable bowel syndrome without diarrhea: Secondary | ICD-10-CM

## 2019-12-01 DIAGNOSIS — Z79899 Other long term (current) drug therapy: Secondary | ICD-10-CM

## 2019-12-01 DIAGNOSIS — Z8616 Personal history of COVID-19: Secondary | ICD-10-CM | POA: Diagnosis not present

## 2019-12-01 DIAGNOSIS — R5382 Chronic fatigue, unspecified: Secondary | ICD-10-CM

## 2019-12-01 DIAGNOSIS — F321 Major depressive disorder, single episode, moderate: Secondary | ICD-10-CM

## 2019-12-01 DIAGNOSIS — K219 Gastro-esophageal reflux disease without esophagitis: Secondary | ICD-10-CM

## 2019-12-01 DIAGNOSIS — F4320 Adjustment disorder, unspecified: Secondary | ICD-10-CM

## 2019-12-01 NOTE — Progress Notes (Signed)
   CC: Followup on depression/anxiety  HPI: Patient is a 64 year old female with past medical history as below who presents for follow-up on depression, anxiety symptoms.   Past Medical History:  Diagnosis Date  . Allergy    seasonal, PCN, antidepressants, bentyl  . Arthritis   . Asthma   . Carpal tunnel syndrome on both sides    right worse than left 2008  . Cataract 06/11/2013  . COPD (chronic obstructive pulmonary disease) ([place])   . COVID-19 virus infection 06/18/2016  . Eczema 05/02/2018  . GERD (gastroesophageal reflux disease)   . Hyperlipidemia    LDL 108 JULY 2013  . IBS (irritable bowel syndrome)   . Multiple nevi 03/01/2015  . Pneumonia   . Positive TB test    From [facility] 2009 note : Recent F/u at [place]. CXRAy negative.  Positive TST in 2007 (72mm).  Unable to tolerate INH due to hepatotoxicity   Review of Systems:   Review of Systems  Cardiovascular: Negative for chest pain.  Gastrointestinal: Negative for abdominal pain.  All other systems reviewed and are negative.   Physical Exam:  Vitals:   12/01/19 1406  BP: 126/67  Pulse: 98  Temp: 98 F (36.7 C)  TempSrc: Oral  SpO2: 98%  Weight: 140 lb 12.8 oz (63.9 kg)  Height: 5' 3.5" (1.613 m)   Physical Exam  Constitutional: She is well-developed, well-nourished, and in no distress.  HENT:  Head: Normocephalic and atraumatic.  Eyes: EOM are normal. Right eye exhibits no discharge. Left eye exhibits no discharge.  Neck: No tracheal deviation present.  Cardiovascular: Normal rate and regular rhythm. Exam reveals no gallop and no friction rub.  No murmur heard. Pulmonary/Chest: Effort normal and breath sounds normal. No respiratory distress. She has no wheezes. She has no rales.  Abdominal: Soft. She exhibits no distension. There is no abdominal tenderness. There is no rebound and no guarding.  Musculoskeletal:        General: No tenderness, deformity or edema. Normal range of motion.     Cervical back: Normal  range of motion.  Neurological: She is alert. Coordination normal.  Skin: Skin is warm and dry. No rash noted. She is not diaphoretic. No erythema.  Psychiatric: Memory and judgment normal.     Assessment & Plan:   See Encounters Tab for problem based charting.  Patient discussed with Dr. Daryll Drown

## 2019-12-01 NOTE — Assessment & Plan Note (Signed)
Patient reports occasional acid reflux symptoms.  Given concerns about side effects (see chronic fatigue problem), will hold famotidine for now and continue Protonix

## 2019-12-01 NOTE — Assessment & Plan Note (Addendum)
Patient reports that while her chronic fatigue has been present since her COVID-19 infection, her symptoms of depression, apathy have worsened.  Patient reports continued decreased interest in activities, energy.  Patient denies suicidal ideation, thoughts of self-harm.  On review of past encounters and laboratory studies, previous work-up included vitamin D, B12, DHEA, testosterone, celiac, TSH, CMP, CBC, iron studies which were all without significant abnormality to explain patient's symptoms.  I discussed again the option of treating patient's depressive symptoms with mirtazapine.  Given patient's past history of reaction to antidepressant medications, she desires to hold off on new medical therapy.  Patient is concerned that the famotidine and hyoscyamine (started 3 weeks ago) are worsening her symptoms.  I believe this is low likelihood, although it is possible.  We will trial holding these medications for a few weeks and see if patient's symptoms improve.  If patient does not have improvement in symptoms and has worsening in GERD or IBS symptoms, we can consider reinitiation.  Plan: *Trial holding hyoscyamine and famotidine, monitor for improvement *Continue discussions on initiation of mirtazapine at future appointments

## 2019-12-01 NOTE — Telephone Encounter (Signed)
Left message for patient to call back to the office;  

## 2019-12-01 NOTE — Assessment & Plan Note (Signed)
Patient desires to stop hyoscyamine which she states she has been taking for the past few weeks.  I think it is reasonable to trial a discontinuation of this medicine given her symptoms.  Patient to follow-up with GI for further management.

## 2019-12-01 NOTE — Patient Instructions (Signed)
You were seen for followup on your fatigue and depressive symptoms. Here are my recommendations:  1) Stop taking hyoscyamine and famotidine. It is possible that your symptoms are not caused by these medications. However, I think it is reasonable to stop taking them and see if your symptoms improve. If you have worsening of your IBS or acid reflux and do not have improvement in your fatigue symptoms, we can always restart these medications  2) Please followup with your GI doctor to discuss alternative medications for your IBS  Thank you for allowing [place] to be part of your medical care! Please call the clinic with any questions or concerns

## 2019-12-02 ENCOUNTER — Encounter: Payer: Self-pay | Admitting: Licensed Clinical Social Worker

## 2019-12-02 NOTE — Progress Notes (Signed)
Internal Medicine Clinic Attending  Case discussed with Dr. MacLean at the time of the visit.  We reviewed the resident's history and exam and pertinent patient test results.  I agree with the assessment, diagnosis, and plan of care documented in the resident's note.    

## 2019-12-02 NOTE — BH Specialist Note (Signed)
Integrated Behavioral Health Follow Up Visit  MRN: [id number] Name: Aimee Davis   Session Start time: 2:45  Session End time: 3:15 Total time: 30 minutes  Type of Service: Integrated Behavioral Health- Individual Interpretor:No.  SUBJECTIVE: Aimee Davis is a 64 y.o. female whom attended the session individually. Patient was referred by Dr. Dareen Piano for anxiety. Patient reports the following symptoms/concerns: anxiety, lack of motivation, recent feelings of being stuck, sadness, and physical pain. Duration of problem: since June; Severity of problem: moderate  OBJECTIVE: Mood: Anxious and Depressed and Affect: Depressed Risk of harm to self or others: No plan to harm self or others  LIFE CONTEXT: Family and Social: Patient reported that her children were experiencing concerns about her mental state, and requested that she be evaluated by a psychologist. Patient reported that she did attend a session, and shared what the psychologist determined as the diagnosis. Patient feels pressure from friends and family to begin psychotropic medications.   GOALS ADDRESSED: Patient will: 1.  Reduce symptoms of: anxiety, depression, obsessions and stress  2.  Increase knowledge and/or ability of: coping skills, healthy habits, self-management skills and stress reduction  3.  Demonstrate ability to: Increase healthy adjustment to current life circumstances and Increase adequate support systems for patient/family  INTERVENTIONS: Interventions utilized:  Mindfulness or Relaxation Training, Brief CBT and Supportive Counseling Standardized Assessments completed: Not Needed  ASSESSMENT: Patient currently experiencing moderate levels of anxiety, and depression symptoms appear to be increasing. Patient reported that she is having difficulty to "get up and go". Patient reported feeling down almost daily, decreasing the frequency of leaving her home, and lacking interest in activities. Patient  processed challenges with leaving her home, and then struggling to leave other's homes once she gets there. Patient reported she does not enjoy being by herself. Patient is denying any thoughts of harming herself, but just appears to be hopeless that her mood will improve.  Patient is unsure at this time if she wants to explore medication options, and feels pressure from her social supports to engage medication management.    Patient may benefit from counseling.  PLAN: 1. Follow up with behavioral health clinician on : two weeks.   Dessie Coma, [facility], [place]

## 2019-12-03 NOTE — Telephone Encounter (Signed)
Patient returned call to the office- patient has chosen to be scheduled for an OV with Dr. Havery Moros -patient has been scheduled on 12/07/2019 at 10:10 am; Patient advised to call back to the office at [phone] should questions/concerns arise;  Patient verbalized understanding of information/instructions;

## 2019-12-04 ENCOUNTER — Other Ambulatory Visit: Payer: Self-pay

## 2019-12-04 ENCOUNTER — Ambulatory Visit: Payer: Medicare Other | Admitting: Internal Medicine

## 2019-12-04 DIAGNOSIS — F323 Major depressive disorder, single episode, severe with psychotic features: Secondary | ICD-10-CM

## 2019-12-04 NOTE — Progress Notes (Addendum)
  [facility] Internal Medicine Residency Telephone Encounter Continuity Care Appointment  HPI:   This telephone encounter was created for [name] on 12/05/2019 for the following purpose/cc confusion.   Past Medical History:  Past Medical History:  Diagnosis Date  . Allergy    seasonal, PCN, antidepressants, bentyl  . Arthritis   . Asthma   . Carpal tunnel syndrome on both sides    right worse than left 2008  . Cataract 06/11/2013  . COPD (chronic obstructive pulmonary disease) ([place])   . COVID-19 virus infection 06/18/2016  . Eczema 05/02/2018  . GERD (gastroesophageal reflux disease)   . Hyperlipidemia    LDL 108 JULY 2013  . IBS (irritable bowel syndrome)   . Multiple nevi 03/01/2015  . Pneumonia   . Positive TB test    From [facility] 2009 note : Recent F/u at [facility]. CXRAy negative.  Positive TST in 2007 (32mm).  Unable to tolerate INH due to hepatotoxicity      ROS:   Negative except as per HPI.   Assessment / Plan / Recommendations:   Please see A&P under problem oriented charting for assessment of the patient's acute and chronic medical conditions.   As always, pt is advised that if symptoms worsen or new symptoms arise, they should go to an urgent care facility or to to ER for further evaluation.   Consent and Medical Decision Making:   Patient discussed with Dr. Rebeca Alert  This is a telephone encounter between Aimee Davis and Kathi Ludwig on 12/05/2019 for depressive symptoms and confusion. The visit was conducted with the patient located at home and [employer] at [facility]. The patient's identity was confirmed using their DOB and current address. The patient and her son per her permission has consented to being evaluated through a telephone encounter and understands the associated risks (an examination cannot be done and the patient may need to come in for an appointment) / benefits (allows the patient to remain at home, decreasing exposure to  coronavirus). I personally spent 45 minutes on medical discussion.

## 2019-12-05 DIAGNOSIS — F329 Major depressive disorder, single episode, unspecified: Secondary | ICD-10-CM | POA: Insufficient documentation

## 2019-12-05 NOTE — Assessment & Plan Note (Signed)
Depression: Two identifiers were used prior to completing the visit. The patient calls today to discuss a broad constellation of symptoms. We spent greater than 74min on the phone. Based on our conversation she is most concerned with fatigue, weight loss, confusion (with some mundane tasks but not with some complex tasks), anhedonia, excess sleeping, feelings of uselessness and contemplating her lifes worth. She denied thoughts of harming herself or a plan to do so and does believe she is better off alive. She stated that she would "never do anything as silly as harm herself." Her symptoms are most consistent with depression. She is able to drive but can not remember how to fix a meal. She is able to remember where her children live but she is not able to recall some recent events. Her memory loss is atypical and inconsistent. She felt like she heard a voice call her name while sleeping one night but could not delineate if this was a dream or an hallucination. This did not reoccur.  She has lost around 7-10lbs over the past 8 months.  My suspicion is that she is suffering from pseudo-dementia related to the trauma of her COVID illness as her symptoms have progressively developed since that time. However, I am unable to rule out other more concerning causes with this visit.  She has good support at home. I spoke with her son who agreed that she would stay with someone this weekend. We will have her come to clinic on Monday for a full evaluation. She was asked not to drive to which she agreed. The patient and her son were advised to call 911 or our on call doctor for any new symptoms.

## 2019-12-07 ENCOUNTER — Ambulatory Visit (INDEPENDENT_AMBULATORY_CARE_PROVIDER_SITE_OTHER): Payer: Medicare Other | Admitting: Gastroenterology

## 2019-12-07 ENCOUNTER — Ambulatory Visit (INDEPENDENT_AMBULATORY_CARE_PROVIDER_SITE_OTHER): Payer: Medicare Other | Admitting: Internal Medicine

## 2019-12-07 ENCOUNTER — Other Ambulatory Visit: Payer: Self-pay

## 2019-12-07 ENCOUNTER — Encounter: Payer: Self-pay | Admitting: Gastroenterology

## 2019-12-07 VITALS — BP 130/63 | HR 86 | Temp 97.6°F | Ht 63.5 in | Wt 138.8 lb

## 2019-12-07 VITALS — BP 112/74 | HR 80 | Temp 98.2°F | Ht 63.5 in | Wt 137.0 lb

## 2019-12-07 DIAGNOSIS — R413 Other amnesia: Secondary | ICD-10-CM

## 2019-12-07 DIAGNOSIS — F323 Major depressive disorder, single episode, severe with psychotic features: Secondary | ICD-10-CM

## 2019-12-07 DIAGNOSIS — F411 Generalized anxiety disorder: Secondary | ICD-10-CM | POA: Diagnosis not present

## 2019-12-07 DIAGNOSIS — R1030 Lower abdominal pain, unspecified: Secondary | ICD-10-CM | POA: Diagnosis not present

## 2019-12-07 DIAGNOSIS — R142 Eructation: Secondary | ICD-10-CM | POA: Diagnosis not present

## 2019-12-07 DIAGNOSIS — F329 Major depressive disorder, single episode, unspecified: Secondary | ICD-10-CM

## 2019-12-07 DIAGNOSIS — R634 Abnormal weight loss: Secondary | ICD-10-CM

## 2019-12-07 MED ORDER — SIMETHICONE 80 MG PO CHEW: CHEWABLE_TABLET | ORAL | 0 refills | Status: DC

## 2019-12-07 MED ORDER — ESCITALOPRAM OXALATE 5 MG PO TABS: 5.0000 mg | ORAL_TABLET | Freq: Every day | ORAL | 2 refills | Status: DC

## 2019-12-07 MED ORDER — IBGARD 90 MG PO CPCR: 1.0000 | ORAL_CAPSULE | Freq: Two times a day (BID) | ORAL | Status: DC | PRN

## 2019-12-07 NOTE — Progress Notes (Signed)
   CC: Memory loss  HPI:[name] is a 64 y.o. female who presents for evaluation of Depression. Please see individual problem based A/P for details.  Past Medical History:  Diagnosis Date  . Allergy    seasonal, PCN, antidepressants, bentyl  . Arthritis   . Asthma   . Carpal tunnel syndrome on both sides    right worse than left 2008  . Cataract 06/11/2013  . COPD (chronic obstructive pulmonary disease) ([place])   . COVID-19 virus infection 06/18/2016  . Eczema 05/02/2018  . GERD (gastroesophageal reflux disease)   . Hyperlipidemia    LDL 108 JULY 2013  . IBS (irritable bowel syndrome)   . Multiple nevi 03/01/2015  . Pneumonia   . Positive TB test    From M[facility] 2009 note : Recent F/u at [facility]. CXRAy negative.  Positive TST in 2007 (26mm).  Unable to tolerate INH due to hepatotoxicity   Review of Systems:  ROS negative except as per HPI.  Physical Exam: Vitals:   12/07/19 1335  BP: 130/63  Pulse: 86  Temp: 97.6 F (36.4 C)  TempSrc: Oral  SpO2: 99%  Weight: 138 lb 12.8 oz (63 kg)  Height: 5' 3.5" (1.613 m)   Filed Weights   12/07/19 1335  Weight: 138 lb 12.8 oz (63 kg)   General: A/O x4, in no acute distress, afebrile, nondiaphoretic HEENT: PEERL, EMO intact Cardio: RRR, no mrg's  Pulmonary: CTA bilaterally, no wheezing or crackles  Abdomen: Bowel sounds normal, soft, nontender  MSK: BLE nontender, nonedematous Neuro: Alert, CNII-XII grossly intact, conversational, strength 5/5 in the upper and lower extremities bilaterally, normal gait Psych: Appropriate affect, not depressed in appearance, engages well  Assessment & Plan:   See Encounters Tab for problem based charting.  Patient discussed with Dr. Lynnae January

## 2019-12-07 NOTE — Patient Instructions (Addendum)
If you are age 66 or older, your body mass index should be between 23-30. Your Body mass index is 23.89 kg/m. If this is out of the aforementioned range listed, please consider follow up with your Primary Care Provider.  If you are age 71 or younger, your body mass index should be between 19-25. Your Body mass index is 23.89 kg/m. If this is out of the aformentioned range listed, please consider follow up with your Primary Care Provider.   Due to recent changes in healthcare laws, you may see the results of your imaging and laboratory studies on MyChart before your provider has had a chance to review them.  We understand that in some cases there may be results that are confusing or concerning to you. Not all laboratory results come back in the same time frame and the provider may be waiting for multiple results in order to interpret others.  Please give [place] 48 hours in order for your provider to thoroughly review all the results before contacting the office for clarification of your results.   We are giving you samples of IBGard to try.  Use as directed. Take at bedtime and as needed.  Please purchase the following medications over the counter and take as directed: Gas-X (for belching).   You have been scheduled for a CT scan of the abdomen and pelvis at [facility], 1st floor Radiology.  You are scheduled on Thursday, 12-17-19 at 2:00pm. You should arrive 15 minutes prior to your appointment time for registration. Please follow the written instructions below on the day of your exam:  WARNING: IF YOU ARE ALLERGIC TO IODINE/X-RAY DYE, PLEASE NOTIFY RADIOLOGY IMMEDIATELY AT [phone]! YOU WILL BE GIVEN A 13 HOUR PREMEDICATION PREP.  1) Do not eat anything after 10:00am (4 hours prior to your test) 2) You have been given 2 bottles of oral contrast to drink. The solution may taste better if refrigerated, but do NOT add ice or any other liquid to this solution. Shake well before  drinking.    Drink 1 bottle of contrast @ 12:00pm (2 hours prior to your exam)  Drink 1 bottle of contrast @ 1:00pm (1 hour prior to your exam)  You may take any medications as prescribed with a small amount of water, if necessary. If you take any of the following medications: METFORMIN, GLUCOPHAGE, GLUCOVANCE, AVANDAMET, RIOMET, FORTAMET, [place] MET, JANUMET, GLUMETZA or METAGLIP, you MAY be asked to HOLD this medication 48 hours AFTER the exam.  The purpose of you drinking the oral contrast is to aid in the visualization of your intestinal tract. The contrast solution may cause some diarrhea. Depending on your individual set of symptoms, you may also receive an intravenous injection of x-ray contrast/dye. Plan on being at [facility] for 30 minutes or longer, depending on the type of exam you are having performed.  This test typically takes 30-45 minutes to complete.  If you have any questions regarding your exam or if you need to reschedule, you may call [phone] between the hours of 8:00 am and 5:00 pm, Monday-Friday.  ________________________________________________________________________   Thank you for entrusting me with your care and for choosing [facility], Dr. Riverside Cellar

## 2019-12-07 NOTE — Progress Notes (Signed)
HPI :  64 year old female with a history of GERD, IBS, colon polyps, chronic abdominal pain, here for follow-up visit for chronic abdominal pain.  The patient has had longstanding lower abdominal cramping around her umbilicus to lower abdomen.  Previously she had an extensive work-up at [facility], she had a negative CT scan in 2013 in 2011 for this issue.  She has had negative colonoscopies in the past, her last exam was done by me in March 2020 without any high risk or concerning pathology.  She states she wakes up and feels some cramps in her lower abdomen reliably, can feel it later in the day but usually is worse in the morning.  Symptoms can last for minutes to hours.  States sometimes it can last for days at a time which is severe.  She has some chronic constipation for which she takes Linzess as needed and states that the Linzess has provided some benefit to her when she takes it.  Overall her bowels have been better on the regimen lately.  She denies any clear triggers to her pain such as eating particular foods or in relation to her bowel habits.  No blood in her stools.  She was given Bentyl in the past, unclear how much that helped her, she was switched to Levsin nightly at her last visit with our office in February (saw Carl Best).  She states the Levsin did help provide some benefit however she had side effects from it, felt anxious, confused, did not like the way it made her feel and she stopped it.  She also stopped her famotidine at that time due to the symptoms.  She has been taking Protonix as needed for reflux.  When asked her about reflux symptoms, she states she has intermittent belching that bothers her.  She denies any pyrosis.  No dysphagia.  States the Protonix does not help her belching too much.  She states she had an EGD in 2009 or so which was normal, no records available of that.  She otherwise reports a lot of fatigue at some bothering her.  She has not been  eating well over the past 3 months and has had some weight loss of about 10 pounds.  She is concerned that something is causing problems in her abdomen which is driving all of this.  She specifically denies depression or anxiety about this however also states she is nervous about what is causing her symptoms.  She has been offered Remeron by her primary care provider and she declined at their visit.  It is been noted in a few office notes that she has had symptoms of depression but she denies that today.  She states she does not like to take medications to treat her problems however also is frustrated by her persistent symptoms and inquires about how to treat this.      Past Medical History:  Diagnosis Date  . Allergy    seasonal, PCN, antidepressants, bentyl  . Arthritis   . Asthma   . Carpal tunnel syndrome on both sides    right worse than left 2008  . Cataract 06/11/2013  . COPD (chronic obstructive pulmonary disease) ([place])   . COVID-19 virus infection 06/18/2016  . Eczema 05/02/2018  . GERD (gastroesophageal reflux disease)   . Hyperlipidemia    LDL 108 JULY 2013  . IBS (irritable bowel syndrome)   . Multiple nevi 03/01/2015  . Pneumonia   . Positive TB test  From [facility] 2009 note : Recent F/u at [facility]. CXRAy negative.  Positive TST in 2007 (39mm).  Unable to tolerate INH due to hepatotoxicity     Past Surgical History:  Procedure Laterality Date  . BREAST CYST EXCISION Right 2011  . BREAST EXCISIONAL BIOPSY Left    benign  . BREAST LUMPECTOMY Right 1990's  . INDUCED ABORTION  2005  . VAGINAL HYSTERECTOMY  1983   partial; for abnormal bleeding   Family History  Problem Relation Age of Onset  . Hypertension Mother   . Heart disease Mother   . Diabetes Mother   . Kidney disease Mother   . Stroke Father   . Diabetes Father   . Heart disease Father   . Hyperlipidemia Father   . Hypertension Father   . Kidney disease Father   . Cancer Sister        breast  .  Diabetes Sister   . Kidney disease Sister   . Hypertension Sister   . Mental illness Brother   . Hypertension Maternal Aunt   . Hypertension Maternal Uncle   . Stomach cancer Maternal Uncle   . Hypertension Paternal Aunt   . Kidney disease Paternal Aunt   . Hypertension Paternal Uncle   . Kidney disease Paternal Uncle   . Colon cancer Neg Hx   . Esophageal cancer Neg Hx   . Pancreatic cancer Neg Hx    Social History   Tobacco Use  . Smoking status: Former Smoker    Packs/day: 0.33    Years: 43.00    Pack years: 14.19    Types: Cigarettes    Quit date: 01/06/2013    Years since quitting: 6.9  . Smokeless tobacco: Never Used  Substance Use Topics  . Alcohol use: No    Alcohol/week: 0.0 standard drinks  . Drug use: Not Currently    Comment: x 26 yrs.   Current Outpatient Medications  Medication Sig Dispense Refill  . Artificial Tear Ointment (DRY EYES OP) Apply 1 drop to eye daily as needed (dry eyes).    . Ascorbic Acid (VITAMIN C) 100 MG tablet Take 50 mg by mouth daily.    . B Complex-C (B-COMPLEX WITH VITAMIN C) tablet Take 1 tablet by mouth daily.    . cetirizine (ZYRTEC) 10 MG tablet Take 10 mg by mouth daily.    . Cholecalciferol (VITAMIN D) 125 MCG (5000 UT) CAPS Take 1-2 tablets by mouth daily.    . Fluticasone-Salmeterol (ADVAIR DISKUS) 250-50 MCG/DOSE AEPB Inhale 1 puff into the lungs 2 (two) times daily. 60 each 6  . linaclotide (LINZESS) 72 MCG capsule Take 1 capsule (72 mcg total) by mouth daily before breakfast. 30 capsule 5  . mometasone (NASONEX) 50 MCG/ACT nasal spray PLACE 2 SPRAYS IN EACH NOSTRIL ONCE DAILY AS NEEDED. 17 g 6  . OVER THE COUNTER MEDICATION MICROTEX VM - FOOD NUTRIENT COMPLEX  2 DAY    . OVER THE COUNTER MEDICATION EO MEGA Essential Oil - 2 daily    . pantoprazole (PROTONIX) 40 MG tablet Take 1 tablet (40 mg total) by mouth daily. (Patient taking differently: Take 40 mg by mouth as needed. ) 90 tablet 11  . Probiotic Product (PROBIOTIC DAILY  PO) Take by mouth.    Marland Kitchen PROVENTIL HFA 108 (90 Base) MCG/ACT inhaler INHALE 2 PUFFS INTO THE LUNGS EVERY 6 HOURS AS NEEDED FOR WHEEZING. 3 each 5   No current facility-administered medications for this visit.   Allergies  Allergen Reactions  .  Penicillins Other (See Comments)    REACTION: rash  . Levsin [Hyoscyamine] Anxiety     Review of Systems: All systems reviewed and negative except where noted in HPI.   Lab Results  Component Value Date   WBC 2.8 (L) 10/26/2019   HGB 12.7 10/26/2019   HCT 38.1 10/26/2019   MCV 82.5 10/26/2019   PLT 224.0 10/26/2019    Lab Results  Component Value Date   CREATININE 0.88 10/26/2019   BUN 9 10/26/2019   NA 134 (L) 10/26/2019   K 4.0 10/26/2019   CL 99 10/26/2019   CO2 27 10/26/2019    Lab Results  Component Value Date   ALT 20 10/26/2019   AST 21 10/26/2019   ALKPHOS 56 10/26/2019   BILITOT 0.4 10/26/2019     Physical Exam: BP 112/74 (BP Location: Left Arm, Patient Position: Sitting, Cuff Size: Normal)   Pulse 80   Temp 98.2 F (36.8 C)   Ht 5' 3.5" (1.613 m)   Wt 137 lb (62.1 kg)   BMI 23.89 kg/m  Constitutional: Pleasant,well-developed, female in no acute distress. Abdominal: Soft, nondistended, nontender. There are no masses palpable. No hepatomegaly. Extremities: no edema Lymphadenopathy: No cervical adenopathy noted. Neurological: Alert and oriented to person place and time. Skin: Skin is warm and dry. No rashes noted. Psychiatric: Normal mood and affect. Behavior is normal.   ASSESSMENT AND PLAN: 65 year old female here for reassessment the following:  Abdominal pain / weight loss - I think she probably has a functional GI tract disorder causing her symptoms.  She has had benefit with Levsin however could not tolerate the medication as above due to some side effects.  I reassured her that the symptoms appear to be going on for a long time and that she had a relatively recent colonoscopy, but she remains quite  concerned about intra-abdominal pathology.  Her last CT scan was 2013.  I discussed options with her.  For peace of mind and provide reassurance in light of her weight loss, I offered her CT scan of the abdomen and pelvis as she seems quite anxious to me about these issues.  After we discussed this she strongly wanted to have the CT scan done.  Pending the CT does not show any concerning pathology, we discussed that she probably has a functional GI disorder, taking Linzess for constipation predominant which is helping.  I will give her some samples of IBgard and recommend she take this a few times a day to see if that helps and should be quite safe without any significant side effects.  She was agreeable with this.  Ultimately I think she probably warrants treatment with a TCA or other antidepressant for some of these symptoms, however am concerned about her tolerability of it given side effects to other milder medications.  Will await her course and results.  Belching - she denies any pyrosis or concerning upper tract symptoms otherwise.  Reports negative EGD in 2009. We discussed how belching is usually benign entity and does not indicate anything concerning, suspect supra gastric belching.  Recommend some over-the-counter simethicone to use as needed, can continue Protonix as needed for her other heartburn symptoms.  She agreed with the plan  [place] Cellar, MD [facility]  I spent 35 minutes of time, including in depth chart review, independent review of results as outlined above, communicating results with the patient directly, face-to-face time with the patient, coordinating care, and ordering studies and medications as appropriate, and documenting this  encounter.   CC: Jeanmarie Hubert, MD

## 2019-12-07 NOTE — Progress Notes (Signed)
Internal Medicine Clinic Attending  Case discussed with Dr. Harbrecht at the time of the visit.  We reviewed the resident's history and exam and pertinent patient test results.  I agree with the assessment, diagnosis, and plan of care documented in the resident's note.  [place]anger Chui, M.D., Ph.D.  

## 2019-12-07 NOTE — Patient Instructions (Signed)
FOLLOW-UP INSTRUCTIONS When: 4-6 wks For: Follow-up of your symptoms What to bring: All of your medications  I have ordered an MRI to make sure there are no structural changes in your brain that may have occurred due to Covid, a new medication called Lexapro to start at 5 mg daily, a referral to Dessie Coma our behavioral health therapist to help work through the memory processing issue.  Additionally I ordered some labs today.  I will let you know of any abnormalities that may arise.  For now, please continue to allow your family to assist you until your memory improves.  Thank you for your visit to the [facility] today. If you have any questions or concerns please call us at [phone].

## 2019-12-08 ENCOUNTER — Encounter: Payer: Self-pay | Admitting: Internal Medicine

## 2019-12-08 ENCOUNTER — Telehealth: Payer: Self-pay | Admitting: Internal Medicine

## 2019-12-08 DIAGNOSIS — R413 Other amnesia: Secondary | ICD-10-CM | POA: Insufficient documentation

## 2019-12-08 LAB — CBC
Hematocrit: 36.5 % (ref 34.0–46.6)
Hemoglobin: 12.5 g/dL (ref 11.1–15.9)
MCH: 28.2 pg (ref 26.6–33.0)
MCHC: 34.2 g/dL (ref 31.5–35.7)
MCV: 82 fL (ref 79–97)
Platelets: 254 10*3/uL (ref 150–450)
RBC: 4.43 x10E6/uL (ref 3.77–5.28)
RDW: 13 % (ref 11.7–15.4)
WBC: 3 10*3/uL — ABNORMAL LOW (ref 3.4–10.8)

## 2019-12-08 LAB — TSH: TSH: 1.46 u[IU]/mL (ref 0.450–4.500)

## 2019-12-08 LAB — VITAMIN B12: Vitamin B-12: 882 pg/mL (ref 232–1245)

## 2019-12-08 NOTE — Assessment & Plan Note (Signed)
Depression/anxiety: The patient demonstrates anhedonia, fatigue, weight loss, confusion, excess sleeping, feelings of uselessness and contemplating her life is worth. Additionally she worries that she will not recover and gets anxious when completing tasks she now finds difficult. However, she denies depression or depressive symptoms.  She states that her symptoms began in mid February around the time she resumed therapy with hyoscyamine which she has been on for several years in the past from 20 15-20 18 prior to [place] being stopped.  Although the medication does list as possible side effects confusion, amnesia, and depressive symptoms this would be highly unusual.  She has stopped the medication since her visit on December 01, 2019 but her symptoms persist.   PHQ-9 12, but patient marked a 3 on feeling that she was not sure she had use prior to striking this out and choosing a 1. I feel that she is dealing with some degree of depression and generalized anxiety based on her symptoms. She does not agree with this assessment.   Plan: I have referred her to [facility] to which she is agreeable to discuss this further I have recommended Lexapro 5mg  daily

## 2019-12-08 NOTE — Progress Notes (Signed)
Internal Medicine Clinic Attending  Case discussed with Dr. Harbrecht at the time of the visit.  We reviewed the resident's history and exam and pertinent patient test results.  I agree with the assessment, diagnosis, and plan of care documented in the resident's note.   

## 2019-12-08 NOTE — Telephone Encounter (Signed)
Attempted to contact Aimee Davis on 3/23 at 8am but no response. Again attempted to contact Aimee Davis at 4:57 pm but again no response. Attempted to update patient on her normal B-12, TSH and unchanged CBC with diff. She should continue to follow with oncology for her leukopenia and pursue the MRI.

## 2019-12-08 NOTE — Assessment & Plan Note (Signed)
Memory loss: Confusion with completing moderately difficult tasks.  Although I feel this is related to a degree of underlying depression and anxiety, the patient denies depressive symptoms. Her most concerning symptom is her processing difficulty with moderate tasks.  She is able to drive for short distances to places that she is familiar with but is unable to prepare a meal from base ingredients.  Her son is present today and attests to this difficulty.  Her Mini-Mental status exam today reveals attention and calculation deficits with some orientation difficulty. There is a degree of mild cognitive impairment which is reportedly of sudden onset. There is a lack of physical exam findings supporting a large are CVA. She stated that she has struggled since she was diagnosed and treated for COVID-19. Given the possibility of increased risk for CVA and the lack of complete understanding of the sequela of COVID in the setting of sudden onset memory loss I feel she warrants brain imaging and possibly referral to neurology if she does not improve.   MMSE-Patient scored 8/10 on Orientation missing the season and month. She scored 3/3 on registration She was not able to subtract from 100 by sevens or spell world backwards scoring 1/5 on total on attention and calculation On recall she scored 2/3 On language she had no difficulty naming to objects, repeating "no ifs ands or buts", she did well following three-step commands, follow instructions, and writing a logical question.  Her clock was appropriately oriented but the hands were labeled in a manner that was atypical. TSH WNL's at 1.46, B-12 WNL's at 882 and CBC with persistent leukopenia at 3.0. She had seen Hematology for the leukopenia in the past who opted to monitor her WBC's. I do not suspect Alzheimer's/Parkinson's type dementia at this time given the sudden onset of the difficulty. She does have a long standing history of anxiety per chart review but do  feel that this should be a diagnosis of exclusion.  Plan:  MRI brain today, consider neurology referral Lexapro 5mg  daily [facility] referral

## 2019-12-09 ENCOUNTER — Other Ambulatory Visit: Payer: Self-pay

## 2019-12-09 ENCOUNTER — Telehealth: Payer: Self-pay | Admitting: Gastroenterology

## 2019-12-09 ENCOUNTER — Telehealth: Payer: Self-pay | Admitting: Internal Medicine

## 2019-12-09 DIAGNOSIS — D709 Neutropenia, unspecified: Secondary | ICD-10-CM

## 2019-12-09 NOTE — Telephone Encounter (Signed)
Pt requesting a call back about her test Results.

## 2019-12-09 NOTE — Telephone Encounter (Signed)
Aimee Davis from [facility] requested order for I-STAT creatinine to be put in for pt when she arrives for CT scheduled 12/17/19.

## 2019-12-09 NOTE — Telephone Encounter (Signed)
Orders entered as requested.

## 2019-12-10 ENCOUNTER — Telehealth: Payer: Self-pay | Admitting: Internal Medicine

## 2019-12-10 NOTE — Telephone Encounter (Signed)
Updated patient on labs today. I advised her [facility]-MRI on April 15th at 2pm. She is aware.

## 2019-12-10 NOTE — Telephone Encounter (Signed)
Referral placed for Hematology, Dr. Irene Limbo.  Thank you

## 2019-12-10 NOTE — Telephone Encounter (Signed)
Called and spoke with [name] - -  Reviewed information she requested.  Normal B12 and TSH.   Reviewed Recommendation for MRI - she has it scheduled in April.   She notes that she was not aware she was supposed to follow up with Hematology.  She did see them in 2019, and Dr. Irene Limbo had requested a 6 month follow up.  However, it looks like this may have been cancelled/missed due to onset of COVID-19 pandemic.  Will replace referral for her to follow up with Hematology for low WBC.   Gilles Chiquito, MD

## 2019-12-15 ENCOUNTER — Encounter: Payer: Self-pay | Admitting: Licensed Clinical Social Worker

## 2019-12-15 ENCOUNTER — Other Ambulatory Visit: Payer: Self-pay | Admitting: *Deleted

## 2019-12-15 ENCOUNTER — Other Ambulatory Visit: Payer: Self-pay

## 2019-12-15 ENCOUNTER — Ambulatory Visit (INDEPENDENT_AMBULATORY_CARE_PROVIDER_SITE_OTHER): Payer: Medicare Other | Admitting: Licensed Clinical Social Worker

## 2019-12-15 ENCOUNTER — Telehealth: Payer: Self-pay | Admitting: Dietician

## 2019-12-15 DIAGNOSIS — F419 Anxiety disorder, unspecified: Secondary | ICD-10-CM

## 2019-12-15 DIAGNOSIS — F321 Major depressive disorder, single episode, moderate: Secondary | ICD-10-CM

## 2019-12-15 NOTE — Telephone Encounter (Signed)
Spoke with Aimee Davis about her recent episode of IBS. She was counseled on an easy to digest meal plan for IBS. Written information from the Nutrition care manual mailed per her request.

## 2019-12-15 NOTE — BH Specialist Note (Signed)
Integrated Behavioral Health Visit via Telemedicine (Telephone)  12/15/2019 NICOLI ROAM [id number]   Session Start time: 2:03  Session End time: 2:30 Total time: 27 minutes  Referring Provider: Dr. Dareen Piano Type of Visit: Telephonic Patient location: Home [facility] Provider location: Office All persons participating in visit: Patient and [facility]  Confirmed patient's address: Yes  Confirmed patient's phone number: Yes  Any changes to demographics: No   Discussed confidentiality: Yes    The following statements were read to the patient and/or legal guardian that are established with the [facility] Provider.  "The purpose of this phone visit is to provide behavioral health care while limiting exposure to the coronavirus (COVID19).  There is a possibility of technology failure and discussed alternative modes of communication if that failure occurs."  "By engaging in this telephone visit, you consent to the provision of healthcare.  Additionally, you authorize for your insurance to be billed for the services provided during this telephone visit."   Patient and/or legal guardian consented to telephone visit: Yes   PRESENTING CONCERNS: Patient and/or family reports the following symptoms/concerns: anxiety, lack of motivation, recent feelings of being stuck, sadness, and physical pain. Duration of problem: since June; Severity of problem: moderate  GOALS ADDRESSED: Patient will: 1.  Reduce symptoms of: anxiety, depression, obsessions and stress  2.  Increase knowledge and/or ability of: coping skills, healthy habits and stress reduction  3.  Demonstrate ability to: Increase healthy adjustment to current life circumstances, Increase adequate support systems for patient/family and Increase motivation to adhere to plan of care  INTERVENTIONS: Interventions utilized:  Mindfulness or Relaxation Training, Behavioral Activation, Brief CBT and Supportive Counseling Standardized Assessments  completed: assessed for SI, HI, and self-harm.  ASSESSMENT: Patient currently experiencing moderate levels of anxiety and depression. Patient recently had an "IBS" episode, and the patient is worried about what foods she can ingest. Patient is reported she has ongoing stomach pain. Patient is noticing her hands shaking and feeling really nervous in the morning time. Patient reported she is having difficulty following conversations, expressing her feelings to others, and finding motivation. Patient is having feelings of loneliness. Patient worked through identifying her reasons for being here.   Patient may benefit from counseling.  PLAN: 1. Follow up with behavioral health clinician on : two weeks.   Dessie Coma, [facility], [place]

## 2019-12-16 ENCOUNTER — Telehealth: Payer: Self-pay

## 2019-12-16 NOTE — Telephone Encounter (Signed)
Pt calls and is concerned about the lexapro, states she is not able to sleep now, she finally doses off at about 5 am and feels tired all day. Also she is worried about kidney damage, she states all her father's side had kidney disease and she is worried about it causing kidney problems. Also when she does wake up she shakes or has tremors for a while. Please advise asap appt made in dr Nelly Rout clinic 4/8 at [phone]

## 2019-12-16 NOTE — Telephone Encounter (Signed)
  Requesting to speak with a nurse about escitalopram (LEXAPRO) 5 MG tablet. Please call pt back.

## 2019-12-17 ENCOUNTER — Ambulatory Visit (HOSPITAL_COMMUNITY)
Admission: RE | Admit: 2019-12-17 | Discharge: 2019-12-17 | Disposition: A | Discharge status: Home / Self Care | Payer: Medicare Other | Source: Ambulatory Visit | Attending: Gastroenterology | Admitting: Gastroenterology

## 2019-12-17 ENCOUNTER — Encounter (HOSPITAL_COMMUNITY): Payer: Self-pay

## 2019-12-17 ENCOUNTER — Other Ambulatory Visit: Payer: Self-pay

## 2019-12-17 DIAGNOSIS — R1030 Lower abdominal pain, unspecified: Secondary | ICD-10-CM

## 2019-12-17 LAB — POCT I-STAT CREATININE: Creatinine, Ser: 0.8 mg/dL (ref 0.44–1.00)

## 2019-12-17 MED ORDER — SODIUM CHLORIDE (PF) 0.9 % IJ SOLN
INTRAMUSCULAR | Status: AC
  Filled 2019-12-17: qty 50

## 2019-12-17 MED ORDER — IOHEXOL 300 MG/ML  SOLN
100.0000 mL | Freq: Once | INTRAMUSCULAR | Status: AC | PRN
  Administered 2019-12-17: 100 mL via INTRAVENOUS

## 2019-12-18 ENCOUNTER — Telehealth: Payer: Self-pay | Admitting: Internal Medicine

## 2019-12-18 ENCOUNTER — Other Ambulatory Visit: Payer: Self-pay | Admitting: Internal Medicine

## 2019-12-18 DIAGNOSIS — F323 Major depressive disorder, single episode, severe with psychotic features: Secondary | ICD-10-CM

## 2019-12-18 NOTE — Telephone Encounter (Signed)
Received a call from Aimee Davis on 12/17/2019 reporting that she recently began experiencing muscle cramps since taking Lexapro which was prescribed to her later this month for severe MDD. She was anxious about continuing to take medication. Muscle cramps is an unusual side effect of Lexapro and seems less likely. However, due to her concern I advised her to hold off on medication for now and be re-evaluated in the clinic. I also advised her that usually SSRIs take 2 weeks to have therapeutic effect.

## 2019-12-18 NOTE — Assessment & Plan Note (Signed)
Spoke with patient on phone. Patient states she has been having insomnia, feeling jittery, and decreased appetite since starting escitalopram. I counseled patient that these symptoms are likely temporarily and should improve with time. Patient emphasized that her new symptoms are significant and intolerable.   A/P: Given intolerable symptoms, in shared decision making, we decided to discontinue the lexapro. No need for taper given recent initiation/dosage. After MR brain on 12/31/19 we will consider starting mirtazapine

## 2019-12-18 NOTE — Telephone Encounter (Signed)
I spoke with patient yesterday and today and I advised her to discontinue her lexapro. I have messaged front desk to arrange followup  Thanks

## 2019-12-21 ENCOUNTER — Ambulatory Visit: Payer: Medicare Other | Attending: Internal Medicine

## 2019-12-21 DIAGNOSIS — Z23 Encounter for immunization: Secondary | ICD-10-CM

## 2019-12-21 NOTE — Progress Notes (Signed)
   Covid-19 Vaccination Clinic  Name:  Aimee Davis    MRN: [id number] DOB: 10/04/1955  12/21/2019  [name] was observed post Covid-19 immunization for 15 minutes without incident. She was provided with Vaccine Information Sheet and instruction to access the V-Safe system.   [name] was instructed to call 911 with any severe reactions post vaccine: Marland Kitchen Difficulty breathing  . Swelling of face and throat  . A fast heartbeat  . A bad rash all over body  . Dizziness and weakness   Immunizations Administered    Name Date Dose VIS Date Route   Pfizer COVID-19 Vaccine 12/21/2019 12:06 PM 0.3 mL 08/28/2019 Intramuscular   Manufacturer: [place]   Lot: [id number]   [place]: [id number]

## 2019-12-22 ENCOUNTER — Telehealth: Payer: Self-pay | Admitting: Hematology

## 2019-12-22 NOTE — Telephone Encounter (Signed)
Scheduled appt per 4/5 sch message - pt aware of appt date and time

## 2019-12-24 ENCOUNTER — Other Ambulatory Visit: Payer: Self-pay

## 2019-12-24 ENCOUNTER — Ambulatory Visit: Payer: Medicaid Other | Admitting: Psychiatry

## 2019-12-24 ENCOUNTER — Encounter: Payer: Medicare Other | Admitting: Internal Medicine

## 2019-12-29 ENCOUNTER — Other Ambulatory Visit: Payer: Self-pay | Admitting: *Deleted

## 2019-12-29 DIAGNOSIS — D709 Neutropenia, unspecified: Secondary | ICD-10-CM

## 2019-12-30 ENCOUNTER — Inpatient Hospital Stay: Payer: Medicare Other | Attending: Hematology | Admitting: Hematology

## 2019-12-30 ENCOUNTER — Inpatient Hospital Stay: Payer: Medicare Other

## 2019-12-30 ENCOUNTER — Encounter: Payer: Self-pay | Admitting: Licensed Clinical Social Worker

## 2019-12-30 ENCOUNTER — Ambulatory Visit (INDEPENDENT_AMBULATORY_CARE_PROVIDER_SITE_OTHER): Payer: Medicare Other | Admitting: Licensed Clinical Social Worker

## 2019-12-30 ENCOUNTER — Other Ambulatory Visit: Payer: Self-pay

## 2019-12-30 DIAGNOSIS — F321 Major depressive disorder, single episode, moderate: Secondary | ICD-10-CM

## 2019-12-30 DIAGNOSIS — F419 Anxiety disorder, unspecified: Secondary | ICD-10-CM

## 2019-12-30 NOTE — Addendum Note (Signed)
Addended by: Hulan Fray on: 12/30/2019 06:01 PM   Modules accepted: Orders

## 2019-12-30 NOTE — BH Specialist Note (Signed)
Integrated Behavioral Health Visit via Telemedicine (Telephone)  12/30/2019 Aimee Davis [id number]   Session Start time: 8:36  Session End time: 9:00 Total time: 24 minutes  Referring Provider: Dr. Dareen Piano Type of Visit: Telephonic Patient location: Home [facility] Provider location: office All persons participating in visit: Patient and [facility]  Confirmed patient's address: Yes  Confirmed patient's phone number: Yes  Any changes to demographics: No   Discussed confidentiality: Yes    The following statements were read to the patient and/or legal guardian that are established with the [facility] Provider.  "The purpose of this phone visit is to provide behavioral health care while limiting exposure to the coronavirus (COVID19).  There is a possibility of technology failure and discussed alternative modes of communication if that failure occurs."  "By engaging in this telephone visit, you consent to the provision of healthcare.  Additionally, you authorize for your insurance to be billed for the services provided during this telephone visit."   Patient and/or legal guardian consented to telephone visit: Yes   PRESENTING CONCERNS: Patient and/or family reports the following symptoms/concerns: anxiety, lack of motivation, recent feelings of being stuck, sadness, and physical pain. Duration of problem: since June, 2020; Severity of problem: moderate  GOALS ADDRESSED: Patient will: 1.  Reduce symptoms of: anxiety, depression, obsessions, stress and loneliness.  2.  Increase knowledge and/or ability of: coping skills, healthy habits and stress reduction  3.  Demonstrate ability to: Increase healthy adjustment to current life circumstances, Increase adequate support systems for patient/family and Increase motivation to adhere to plan of care  INTERVENTIONS: Interventions utilized:  Mindfulness or Relaxation Training, Brief CBT and Supportive Counseling Standardized Assessments  completed: assessed for SI, HI, and self-harm.  ASSESSMENT: Patient currently experiencing moderate levels of anxiety and depression. Patient reported a slight decrease in anxiety and insomnia since stopping the Lexapro. Patient reported that she has difficulty challenges sometimes due to her "bones hurting". Patient is still having decreased appetite, but she has found that when she eats with others, she consumes more food. Patient reported one of the barriers to eating is that she does not prepare meals for herself anymore. Patient is continuing to report issues with memory and concentration.   Patient still has issues with   Patient may benefit from counseling.  PLAN: 1. Follow up with behavioral health clinician on : one to two weeks.   Dessie Coma, [facility], [place]

## 2019-12-31 ENCOUNTER — Encounter (HOSPITAL_COMMUNITY): Payer: Self-pay | Admitting: *Deleted

## 2019-12-31 ENCOUNTER — Ambulatory Visit (HOSPITAL_COMMUNITY)
Admission: RE | Admit: 2019-12-31 | Discharge: 2019-12-31 | Disposition: A | Discharge status: Home / Self Care | Payer: Medicare Other | Source: Ambulatory Visit | Attending: Internal Medicine | Admitting: Internal Medicine

## 2019-12-31 ENCOUNTER — Telehealth: Payer: Self-pay | Admitting: Hematology

## 2019-12-31 ENCOUNTER — Other Ambulatory Visit: Payer: Self-pay

## 2019-12-31 DIAGNOSIS — R413 Other amnesia: Secondary | ICD-10-CM

## 2019-12-31 MED ORDER — GADOBUTROL 1 MMOL/ML IV SOLN
6.5000 mL | Freq: Once | INTRAVENOUS | Status: AC | PRN
  Administered 2019-12-31: 6.5 mL via INTRAVENOUS

## 2019-12-31 NOTE — Telephone Encounter (Signed)
Called pt per 4/14 sch message- no answer and no vmail

## 2020-01-05 ENCOUNTER — Encounter: Payer: Self-pay | Admitting: *Deleted

## 2020-01-05 ENCOUNTER — Other Ambulatory Visit: Payer: Self-pay

## 2020-01-05 ENCOUNTER — Encounter: Payer: Self-pay | Admitting: Licensed Clinical Social Worker

## 2020-01-05 ENCOUNTER — Ambulatory Visit (INDEPENDENT_AMBULATORY_CARE_PROVIDER_SITE_OTHER): Payer: Medicare Other | Admitting: Licensed Clinical Social Worker

## 2020-01-05 DIAGNOSIS — F321 Major depressive disorder, single episode, moderate: Secondary | ICD-10-CM

## 2020-01-05 NOTE — BH Specialist Note (Signed)
Integrated Behavioral Health Visit via Telemedicine (Telephone)  01/05/2020 MACAYLE CACKOWSKI [id number]   Session Start time: 9:30  Session End time: 10:00 Total time: 30 minutes  Referring Provider: Dr. Dareen Piano Type of Visit: Telephonic Patient location: Home [facility] Provider location: Office All persons participating in visit: Patient and [facility]  Confirmed patient's address: Yes  Confirmed patient's phone number: Yes  Any changes to demographics: No   Discussed confidentiality: Yes    The following statements were read to the patient and/or legal guardian that are established with the [facility] Provider.  "The purpose of this phone visit is to provide behavioral health care while limiting exposure to the coronavirus (COVID19).  There is a possibility of technology failure and discussed alternative modes of communication if that failure occurs."  "By engaging in this telephone visit, you consent to the provision of healthcare.  Additionally, you authorize for your insurance to be billed for the services provided during this telephone visit."   Patient and/or legal guardian consented to telephone visit: Yes   PRESENTING CONCERNS: Patient and/or family reports the following symptoms/concerns: anxiety, lack of motivation, recent feelings of being stuck, sadness, and physical pain. Duration of problem: since June 2020; Severity of problem: moderate  GOALS ADDRESSED: Patient will: 1.  Reduce symptoms of: anxiety, depression, obsessions, stress and loneliness.  2.  Increase knowledge and/or ability of: coping skills, healthy habits and stress reduction  3.  Demonstrate ability to: Increase healthy adjustment to current life circumstances, Increase adequate support systems for patient/family and Increase motivation to adhere to plan of care  INTERVENTIONS: Interventions utilized:  Motivational Interviewing, Mindfulness or Psychologist, educational, Brief CBT, Supportive Counseling and  Sleep Hygiene Standardized Assessments completed: Not Needed  ASSESSMENT: Patient currently experiencing moderate levels of anxiety and depression. Patient's anxiety has decreased slightly since the last session. Patient is continuing to have sleep challenges (staying asleep). Patient reported she pondered if her food intake is causing sleep challenges for her. Patient is continuing to report her stomach being upset by food, and she is cautious of what food she puts into her body.  Patient processed her loneliness. Long time hx of loneliness. Patient does enjoy individual activities, but she prefers being active and around others. Patient reported feels most of her friends enjoy watching television and staying home. Patient finds herself being bored at times.   Patient may benefit from counseling  PLAN: 1. Follow up with behavioral health clinician on : one to two weeks.  Dessie Coma, [facility], [place]

## 2020-01-06 ENCOUNTER — Telehealth: Payer: Self-pay

## 2020-01-06 ENCOUNTER — Other Ambulatory Visit: Payer: Self-pay

## 2020-01-06 ENCOUNTER — Telehealth: Payer: Self-pay | Admitting: Hematology

## 2020-01-06 NOTE — Telephone Encounter (Signed)
Called patient to reschedule 04/14 appointments, left a voicemail.

## 2020-01-06 NOTE — Telephone Encounter (Signed)
Requesting test results, please call pt back.  

## 2020-01-12 ENCOUNTER — Other Ambulatory Visit: Payer: Self-pay

## 2020-01-12 ENCOUNTER — Ambulatory Visit (INDEPENDENT_AMBULATORY_CARE_PROVIDER_SITE_OTHER): Payer: Medicare Other | Admitting: Licensed Clinical Social Worker

## 2020-01-12 ENCOUNTER — Encounter: Payer: Self-pay | Admitting: Licensed Clinical Social Worker

## 2020-01-12 DIAGNOSIS — F321 Major depressive disorder, single episode, moderate: Secondary | ICD-10-CM

## 2020-01-12 NOTE — BH Specialist Note (Signed)
Integrated Behavioral Health Visit via Telemedicine (Telephone)  01/12/2020 XAVIER FORRISTER [id number]   Session Start time: 2:45  Session End time: 3;15 Total time: 46 MINUTES   Referring Provider: Dr. Dareen Piano Type of Visit: Telephonic Patient location: Home [facility] Provider location: Office All persons participating in visit: Patient and [facility]  Confirmed patient's address: Yes  Confirmed patient's phone number: Yes  Any changes to demographics: No   Discussed confidentiality: Yes    The following statements were read to the patient and/or legal guardian that are established with the [facility] Provider.  "The purpose of this phone visit is to provide behavioral health care while limiting exposure to the coronavirus (COVID19).  There is a possibility of technology failure and discussed alternative modes of communication if that failure occurs."  "By engaging in this telephone visit, you consent to the provision of healthcare.  Additionally, you authorize for your insurance to be billed for the services provided during this telephone visit."   Patient and/or legal guardian consented to telephone visit: Yes   PRESENTING CONCERNS: Patient and/or family reports the following symptoms/concerns: anxiety, lack of motivation, recent feelings of being stuck, sadness, and physical pain. Duration of problem: since June 2020; Severity of problem: moderate  GOALS ADDRESSED: Patient will: 1.  Reduce symptoms of: anxiety, depression, stress and loneliness.  2.  Increase knowledge and/or ability of: coping skills, healthy habits and stress reduction  3.  Demonstrate ability to: Increase healthy adjustment to current life circumstances, Increase adequate support systems for patient/family and Increase motivation to adhere to plan of care  INTERVENTIONS: Interventions utilized:  Motivational Interviewing, Mindfulness or Psychologist, educational, Brief CBT and Supportive Counseling Standardized  Assessments completed: Not Needed  ASSESSMENT: Patient currently experiencing mild-moderate levels of anxiety and depression. Patient reported an improvement with her memory and concentration over the past week. Patient's appetite has improved over the past week, and she has been able to cook a meal. Patient is unsure where this progress came from, but she is very grateful. Patient was less anxious than the previous session, and more goal-focused.   Patient processed a friendship, and how she can set healthy boundaries. Patient does still struggle with loneliness. Patient believes this is due to moving about 30 minutes from her family about two years ago.   Patient may benefit from counseling.  PLAN: 1. Follow up with behavioral health clinician on : one to two weeks.  Dessie Coma, [facility], [place]

## 2020-01-14 ENCOUNTER — Telehealth: Payer: Self-pay | Admitting: *Deleted

## 2020-01-14 ENCOUNTER — Encounter: Payer: Self-pay | Admitting: Internal Medicine

## 2020-01-14 ENCOUNTER — Ambulatory Visit (INDEPENDENT_AMBULATORY_CARE_PROVIDER_SITE_OTHER): Payer: Medicare Other | Admitting: Internal Medicine

## 2020-01-14 VITALS — BP 109/67 | HR 68 | Temp 98.4°F | Wt 142.9 lb

## 2020-01-14 DIAGNOSIS — J4541 Moderate persistent asthma with (acute) exacerbation: Secondary | ICD-10-CM

## 2020-01-14 DIAGNOSIS — Z0000 Encounter for general adult medical examination without abnormal findings: Secondary | ICD-10-CM

## 2020-01-14 DIAGNOSIS — R5382 Chronic fatigue, unspecified: Secondary | ICD-10-CM

## 2020-01-14 MED ORDER — ALBUTEROL SULFATE HFA 108 (90 BASE) MCG/ACT IN AERS: 2.0000 | INHALATION_SPRAY | Freq: Four times a day (QID) | RESPIRATORY_TRACT | 3 refills | Status: DC | PRN

## 2020-01-14 MED ORDER — MOMETASONE FUROATE 50 MCG/ACT NA SUSP: NASAL | 6 refills | Status: DC

## 2020-01-14 MED ORDER — FLUTICASONE-SALMETEROL 500-50 MCG/DOSE IN AEPB: 1.0000 | INHALATION_SPRAY | Freq: Two times a day (BID) | RESPIRATORY_TRACT | 3 refills | Status: DC

## 2020-01-14 NOTE — Progress Notes (Signed)
   CC: Shortness of breath  HPI: Patient is a 64 year old female with past medical history significant for asthma, COPD, and chronic fatigue who presents for evaluation of worsening respiratory capacity.  [name] is a 64 y.o.   Past Medical History:  Diagnosis Date  . Allergy    seasonal, PCN, antidepressants, bentyl  . Arthritis   . Asthma   . Carpal tunnel syndrome on both sides    right worse than left 2008  . Cataract 06/11/2013  . COPD (chronic obstructive pulmonary disease) ([place])   . COVID-19 virus infection 06/18/2016  . Eczema 05/02/2018  . GERD (gastroesophageal reflux disease)   . Hyperlipidemia    LDL 108 JULY 2013  . IBS (irritable bowel syndrome)   . Multiple nevi 03/01/2015  . Pneumonia   . Positive TB test    From [facility] 2009 note : Recent F/u at [facility]. CXRAy negative.  Positive TST in 2007 (68mm).  Unable to tolerate INH due to hepatotoxicity   Review of Systems:   Review of Systems  Constitutional: Negative for chills, fever and malaise/fatigue.  Respiratory: Positive for shortness of breath.   Cardiovascular: Negative for chest pain.  All other systems reviewed and are negative.   Physical Exam:  Vitals:   01/14/20 1329  BP: 109/67  Pulse: 68  Temp: 98.4 F (36.9 C)  TempSrc: Oral  SpO2: 100%  Weight: 142 lb 14.4 oz (64.8 kg)   Physical Exam  Constitutional: She is well-developed, well-nourished, and in no distress.  HENT:  Head: Normocephalic and atraumatic.  Eyes: EOM are normal. Right eye exhibits no discharge. Left eye exhibits no discharge.  Neck: No tracheal deviation present.  Cardiovascular: Normal rate and regular rhythm. Exam reveals no gallop and no friction rub.  No murmur heard. Pulmonary/Chest: Effort normal and breath sounds normal. No respiratory distress. She has no wheezes. She has no rales.  Abdominal: Soft. She exhibits no distension. There is no abdominal tenderness. There is no rebound and no guarding.    Musculoskeletal:        General: No tenderness, deformity or edema. Normal range of motion.     Cervical back: Normal range of motion.  Neurological: She is alert. Coordination normal.  Skin: Skin is warm and dry. No rash noted. She is not diaphoretic. No erythema.  Psychiatric: Memory and judgment normal.    Assessment & Plan:   See Encounters Tab for problem based charting.  Patient discussed with Dr. Philipp Ovens

## 2020-01-14 NOTE — Telephone Encounter (Signed)
Pt has an appt schedule today with her PCP.

## 2020-01-14 NOTE — Patient Instructions (Addendum)
You were seen for followup. Here are my recommendations:  1) We have increased the dose of your advair. Continue to take 1 puff twice daily  2) We have sent an order to have breathing studies done to evaluate the severity of your COPD and asthma  Thank you for allowing [place] to be part of your medical care!

## 2020-01-16 NOTE — Assessment & Plan Note (Signed)
Cervical cancer screening: Patient reports that she had a complete hysterectomy.  Patient states she was initially told it was a partial hysterectomy but was subsequently informed that she had a complete hysterectomy.  I am not able to find records on this procedure.

## 2020-01-16 NOTE — Assessment & Plan Note (Addendum)
Relevant past encounters and laboratory studies reviewed.  In summary, patient has had several months of chronic fatigue and extensive work-up including TSH, CMP, CBC, MR brain has been without significant abnormality.  Patient today reports that her symptoms have completely resolved. Patient is no longer experiencing excessive fatigue, loss of appetite, anhedonia, excessive sleeping, or feelings of worthlessness.  At recent visit, patient was previously started on Lexapro, which was stopped after a few days of treatment due to intolerable side effects.  Assessment/Plan: Patient symptoms may been secondary to post-Covid fatigue versus primary psychiatric disorder.  We will continue to monitor for recurrence.  No further intervention indicated at this time.

## 2020-01-16 NOTE — Assessment & Plan Note (Addendum)
Patient reports exertional shortness of breath over the past several months, believes it has worsened.  Patient states she feels need for albuterol inhaler every day.  Patient with both a history of asthma and COPD, no PFTs on file.  No wheezing on exam, no increased cough, no evidence for acute asthma or COPD exacerbation.  Assessment/Plan: Patient currently taking fluticasone-salmeterol 250-50 one puff twice daily + albuterol inhaler as needed.  Will increase fluticasone-salmeterol inhaler dosage to 500-50, and monitor for improvement. If PFTs demonstrate COPD pattern or patient does not improve with current regimen, can consider adding LAMA. *Fluticasone-salmeterol 500-50 one puff twice daily *Albuterol inhaler as needed *PFTs ordered

## 2020-01-18 NOTE — Telephone Encounter (Signed)
Pt states the inhaler that was increased this past week is not helping at all, please advise

## 2020-01-18 NOTE — Progress Notes (Signed)
Internal Medicine Clinic Attending  Case discussed with Dr. MacLean at the time of the visit.  We reviewed the resident's history and exam and pertinent patient test results.  I agree with the assessment, diagnosis, and plan of care documented in the resident's note.    

## 2020-01-19 MED ORDER — FLUTICASONE-SALMETEROL 250-50 MCG/DOSE IN AEPB: 1.0000 | INHALATION_SPRAY | Freq: Two times a day (BID) | RESPIRATORY_TRACT | 0 refills | Status: DC

## 2020-01-19 NOTE — Assessment & Plan Note (Signed)
Spoke with patient about hoarseness that developed after taking increased dosage of Advair diskus (from 250-50 to 500-50). In shared decision making, we have decided to return to prior dosage of Advair diskus (250-50) and wait for PFT results. If results suggest patient might benefit from escalation in therapy, we will review best practices to avoid this side-effect and return to higher dosage

## 2020-01-19 NOTE — Telephone Encounter (Signed)
   Jeanmarie Hubert, MD 01/19/2020, 12:39 PM

## 2020-01-20 ENCOUNTER — Ambulatory Visit (INDEPENDENT_AMBULATORY_CARE_PROVIDER_SITE_OTHER): Payer: Medicare Other | Admitting: Licensed Clinical Social Worker

## 2020-01-20 ENCOUNTER — Encounter: Payer: Self-pay | Admitting: Licensed Clinical Social Worker

## 2020-01-20 ENCOUNTER — Other Ambulatory Visit: Payer: Self-pay

## 2020-01-20 DIAGNOSIS — F419 Anxiety disorder, unspecified: Secondary | ICD-10-CM

## 2020-01-20 NOTE — BH Specialist Note (Signed)
Integrated Behavioral Health Visit via Telemedicine (Telephone)  01/20/2020 AMANI ALPER [id number]   Session Start time: 1:01  Session End time: 1:31 Total time: 30 minutes  Referring Provider: Dr. Dareen Piano Type of Visit: Telephonic Patient location: Home [facility] Provider location: Office All persons participating in visit: Patient and [facility]  Confirmed patient's address: Yes  Confirmed patient's phone number: Yes  Any changes to demographics: No   Discussed confidentiality: Yes    The following statements were read to the patient and/or legal guardian that are established with the [facility] Provider.  "The purpose of this phone visit is to provide behavioral health care while limiting exposure to the coronavirus (COVID19).  There is a possibility of technology failure and discussed alternative modes of communication if that failure occurs."  "By engaging in this telephone visit, you consent to the provision of healthcare.  Additionally, you authorize for your insurance to be billed for the services provided during this telephone visit."   Patient and/or legal guardian consented to telephone visit: Yes   PRESENTING CONCERNS: Patient and/or family reports the following symptoms/concerns: mild sadness and anxiety Duration of problem: since June 2020; Severity of problem: mild  GOALS ADDRESSED: Patient will: 1.  Reduce symptoms of: anxiety and stress  2.  Increase knowledge and/or ability of: coping skills and healthy habits  3.  Demonstrate ability to: Increase healthy adjustment to current life circumstances, Increase adequate support systems for patient/family and Increase motivation to adhere to plan of care  INTERVENTIONS: Interventions utilized:  Motivational Interviewing, Brief CBT and Supportive Counseling Standardized Assessments completed: Not Needed  ASSESSMENT: Patient currently experiencing mild anxiety and lack of motivation. Patient is continuing to improve  with her mood and anxiety. Patient identified what she wants to put in place in order to improve her life. Patient is planning to implement more social. Patient reported that she is "shy", and that keeps her from having friends. Patient expressed a desire to meet new people.   Patient may benefit from counseling.  PLAN: 1. Follow up with behavioral health clinician on : two weeks.  Dessie Coma, [facility], [place]

## 2020-01-26 ENCOUNTER — Encounter: Payer: Self-pay | Admitting: Licensed Clinical Social Worker

## 2020-01-26 ENCOUNTER — Ambulatory Visit (INDEPENDENT_AMBULATORY_CARE_PROVIDER_SITE_OTHER): Payer: Medicare Other | Admitting: Licensed Clinical Social Worker

## 2020-01-26 ENCOUNTER — Other Ambulatory Visit: Payer: Self-pay

## 2020-01-26 DIAGNOSIS — F419 Anxiety disorder, unspecified: Secondary | ICD-10-CM

## 2020-01-26 NOTE — BH Specialist Note (Signed)
Integrated Behavioral Health Visit via Telemedicine (Telephone)  01/26/2020 Aimee Davis [id number]   Session Start time: 10:36  Session End time: 11:00 Total time: 24 minutes  Referring Provider: Dr. Dareen Piano Type of Visit: Telephonic Patient location: home [facility] Provider location: Office All persons participating in visit: Patient and [facility]  Confirmed patient's address: Yes  Confirmed patient's phone number: Yes  Any changes to demographics: No   Discussed confidentiality: Yes    The following statements were read to the patient and/or legal guardian that are established with the [facility] Provider.  "The purpose of this phone visit is to provide behavioral health care while limiting exposure to the coronavirus (COVID19).  There is a possibility of technology failure and discussed alternative modes of communication if that failure occurs."  "By engaging in this telephone visit, you consent to the provision of healthcare.  Additionally, you authorize for your insurance to be billed for the services provided during this telephone visit."   Patient and/or legal guardian consented to telephone visit: Yes   PRESENTING CONCERNS: Patient and/or family reports the following symptoms/concerns: anxiety, health concerns, issues with decision making.  Duration of problem: since June; Severity of problem: mild  GOALS ADDRESSED: Patient will: 1.  Reduce symptoms of: anxiety and stress  2.  Increase knowledge and/or ability of: coping skills, healthy habits and stress reduction  3.  Demonstrate ability to: Increase healthy adjustment to current life circumstances and Increase adequate support systems for patient/family  INTERVENTIONS: Interventions utilized:  Solution-Focused Strategies, Mindfulness or Relaxation Training, Brief CBT and Supportive Counseling Standardized Assessments completed: Not Needed  ASSESSMENT: Patient currently experiencing mild anxiety. Patient is trying  to find a gym/senior center to join. Patient wants to have a reason to leave her home. Patient reported that she is continuing to make progress. Patient is noticing that she is having difficulty making decisions, and would like to make decisions easier. Patient finds herself contemplating longer than desired.   Patient may benefit from counseling.  PLAN: 1. Follow up with behavioral health clinician on : two weeks.   Dessie Coma, [facility], [place]

## 2020-02-02 ENCOUNTER — Other Ambulatory Visit: Payer: Self-pay

## 2020-02-02 ENCOUNTER — Encounter: Payer: Self-pay | Admitting: Licensed Clinical Social Worker

## 2020-02-02 ENCOUNTER — Ambulatory Visit (INDEPENDENT_AMBULATORY_CARE_PROVIDER_SITE_OTHER): Payer: Medicare Other | Admitting: Licensed Clinical Social Worker

## 2020-02-02 DIAGNOSIS — F419 Anxiety disorder, unspecified: Secondary | ICD-10-CM

## 2020-02-02 NOTE — BH Specialist Note (Signed)
Integrated Behavioral Health Visit via Telemedicine (Telephone)  02/02/2020 SHAHIDAH HANIF [id number]   Session Start time: 2:40  Session End time: 3:10 Total time: 30 minutes  Referring Provider: Dr. Dareen Piano Type of Visit: Telephonic Patient location: Home [facility] Provider location: Office All persons participating in visit: Patient and [facility]  Confirmed patient's address: Yes  Confirmed patient's phone number: Yes  Any changes to demographics: No   Discussed confidentiality: Yes    The following statements were read to the patient and/or legal guardian that are established with the [facility] Provider.  "The purpose of this phone visit is to provide behavioral health care while limiting exposure to the coronavirus (COVID19).  There is a possibility of technology failure and discussed alternative modes of communication if that failure occurs."  "By engaging in this telephone visit, you consent to the provision of healthcare.  Additionally, you authorize for your insurance to be billed for the services provided during this telephone visit."   Patient and/or legal guardian consented to telephone visit: Yes   PRESENTING CONCERNS: Patient and/or family reports the following symptoms/concerns: anxiety, health concerns, issues with decision making.  Duration of problem: since June; Severity of problem: mild   GOALS ADDRESSED: Patient will: 1.  Reduce symptoms of: anxiety and stress  2.  Increase knowledge and/or ability of: coping skills, healthy habits and stress reduction  3.  Demonstrate ability to: Increase healthy adjustment to current life circumstances, Increase adequate support systems for patient/family and Increase motivation to adhere to plan of care  INTERVENTIONS: Interventions utilized:  Solution-Focused Strategies, Brief CBT and Supportive Counseling Standardized Assessments completed: Not Needed  ASSESSMENT: Patient currently experiencing mild anxiety.  Patient's anxiety is continuing to decrease. Patient formed a list of tasks/goals that she wants to complete. Patient joined the gym, and feels positive about her movement towards completing her goals. Patient wants to get up earlier in the morning to get her day started. Patient continues to struggle with loneliness. Patient reported that she needs to be around others.  Patient reported that she is having mild stomach issues, but these complaints have decreased over the past month.   Patient may benefit from counseling.  PLAN: 1. Follow up with behavioral health clinician on : three weeks.  Dessie Coma, [facility], [place]

## 2020-03-01 ENCOUNTER — Encounter: Payer: Self-pay | Admitting: Licensed Clinical Social Worker

## 2020-03-01 ENCOUNTER — Other Ambulatory Visit: Payer: Self-pay

## 2020-03-01 ENCOUNTER — Ambulatory Visit (INDEPENDENT_AMBULATORY_CARE_PROVIDER_SITE_OTHER): Payer: Medicare Other | Admitting: Licensed Clinical Social Worker

## 2020-03-01 DIAGNOSIS — F419 Anxiety disorder, unspecified: Secondary | ICD-10-CM

## 2020-03-01 NOTE — BH Specialist Note (Signed)
Integrated Behavioral Health Visit via Telemedicine (Telephone)  03/01/2020 Aimee Davis [id number]   Session Start time: 2;03  Session End time: 2;43 Total time: 40 minutes  Referring Provider: Dr. Dareen Piano Type of Visit: Telephonic Patient location: Home [facility] Provider location: Office All persons participating in visit: Patient and [facility]  Confirmed patient's address: Yes  Confirmed patient's phone number: Yes  Any changes to demographics: No    Discussed confidentiality: Yes    The following statements were read to the patient and/or legal guardian that are established with the [facility] Provider.  "The purpose of this phone visit is to provide behavioral health care while limiting exposure to the coronavirus (COVID19).  There is a possibility of technology failure and discussed alternative modes of communication if that failure occurs."  "By engaging in this telephone visit, you consent to the provision of healthcare.  Additionally, you authorize for your insurance to be billed for the services provided during this telephone visit."   Patient and/or legal guardian consented to telephone visit: Yes   PRESENTING CONCERNS: Patient and/or family reports the following symptoms/concerns: anxiety, loneliness,health concerns, issues with decision making. Duration of problem: one year; Severity of problem: mild  GOALS ADDRESSED: Patient will: 1.  Reduce symptoms of: anxiety, depression and stress  2.  Increase knowledge and/or ability of: coping skills, healthy habits and stress reduction  3.  Demonstrate ability to: Increase healthy adjustment to current life circumstances and Increase adequate support systems for patient/family  INTERVENTIONS: Interventions utilized:  Mindfulness or Relaxation Training, Brief CBT and Supportive Counseling Standardized Assessments completed: Not Needed  ASSESSMENT: Patient currently experiencing mild levels of anxiety and sadness due  to a recent negative interaction with her family. Patient reported feeling judged and left out of her family. Patient was doing better with her mood and motivation prior to this family episode last Friday. Patient was encouraged not to allow their comments to disrupt her progress. Patient is direct and honest, and this can be problematic in her family system.  No somatic symptoms discussed this session.   Patient may benefit from counseling.  PLAN: 1. Follow up with behavioral health clinician on : two weeks.   Dessie Coma, [facility], [place]

## 2020-03-22 ENCOUNTER — Ambulatory Visit: Payer: Medicare Other | Admitting: Licensed Clinical Social Worker

## 2020-03-22 ENCOUNTER — Telehealth: Payer: Self-pay | Admitting: Licensed Clinical Social Worker

## 2020-03-22 NOTE — Telephone Encounter (Signed)
Patient was called for her scheduled appointment. Patient did not answer, and a vm was left for the patient.

## 2020-03-29 ENCOUNTER — Telehealth: Payer: Self-pay | Admitting: Student

## 2020-03-29 NOTE — Telephone Encounter (Signed)
Pls contact regarding old inhaler [phone]

## 2020-03-29 NOTE — Telephone Encounter (Signed)
Lm for rtc 

## 2020-04-13 ENCOUNTER — Other Ambulatory Visit: Payer: Self-pay

## 2020-04-13 NOTE — Telephone Encounter (Signed)
Call from pt - stated she has Advair with expiration date of 01/2020. Asking if she if ok to use. I informed pt to call her pharmacy and if they say it's best not to use pass expiration date to call back and we can send a refill to her doctor. She stated ok.

## 2020-04-13 NOTE — Telephone Encounter (Signed)
Requesting to speak with a nurse about inhaler, please call pt back.  

## 2020-04-13 NOTE — Telephone Encounter (Signed)
Return pt's call; no answer - left message to call the office. 

## 2020-04-27 ENCOUNTER — Ambulatory Visit (INDEPENDENT_AMBULATORY_CARE_PROVIDER_SITE_OTHER): Payer: Medicare Other | Admitting: Licensed Clinical Social Worker

## 2020-04-27 ENCOUNTER — Other Ambulatory Visit: Payer: Self-pay

## 2020-04-27 ENCOUNTER — Encounter: Payer: Self-pay | Admitting: Licensed Clinical Social Worker

## 2020-04-27 DIAGNOSIS — F419 Anxiety disorder, unspecified: Secondary | ICD-10-CM

## 2020-04-27 DIAGNOSIS — F321 Major depressive disorder, single episode, moderate: Secondary | ICD-10-CM

## 2020-04-27 NOTE — BH Specialist Note (Signed)
Integrated Behavioral Health Visit via Telemedicine (Telephone)  04/27/2020 Aimee Davis [id number]   Session Start time: 10:15  Session End time: 10:45 Total time: 30 minutes  Referring Provider: Dr. Coy Saunas Type of Visit: Telephonic Patient location: Home [facility] Provider location: Remote All persons participating in visit: Patient and [facility]  Confirmed patient's address: Yes  Confirmed patient's phone number: Yes  Any changes to demographics: Yes   The following statements were read to the patient and/or legal guardian that are established with the [facility] Provider.  "The purpose of this phone visit is to provide behavioral health care while limiting exposure to the coronavirus (COVID19).  There is a possibility of technology failure and discussed alternative modes of communication if that failure occurs."  Discussed confidentiality: Yes   "By engaging in this telephone visit, you consent to the provision of healthcare.  Additionally, you authorize for your insurance to be billed for the services provided during this telephone visit."   Patient and/or legal guardian consented to telephone visit: Yes   PRESENTING CONCERNS: Patient and/or family reports the following symptoms/concerns: anxiety, loneliness,health concerns, issues with decision making. Duration of problem: one year; Severity of problem: mild   GOALS ADDRESSED: Patient will: 1.  Reduce symptoms of: anxiety, depression and stress  2.  Increase knowledge and/or ability of: coping skills and healthy habits  3.  Demonstrate ability to: Increase healthy adjustment to current life circumstances, Increase adequate support systems for patient/family and Increase motivation to adhere to plan of care  INTERVENTIONS: Interventions utilized:  Motivational Interviewing, Solution-Focused Strategies, Mindfulness or Psychologist, educational, Brief CBT and Supportive Counseling Standardized Assessments completed: assessed for  SI, HI, and self-harm.  ASSESSMENT: Patient currently experiencing mild levels of depression and anxiety. Patient processed that she struggles being alone, and feels that she has been most of her life. Patient also has challenges engaging in social interactions with others.    Patient identified one of her challenges in engaging with others as being "shy". Patient processed where this characteristic originated. Patient was encouraged to push through the fear of connection and being building relationships.  Patient may benefit from counseling.  PLAN: 1. Follow up with behavioral health clinician on : two weeks.   Dessie Coma, [facility], [place]

## 2020-05-03 ENCOUNTER — Ambulatory Visit: Payer: Medicare Other | Admitting: Licensed Clinical Social Worker

## 2020-05-16 ENCOUNTER — Encounter: Payer: Self-pay | Admitting: *Deleted

## 2020-05-17 ENCOUNTER — Other Ambulatory Visit: Payer: Self-pay

## 2020-05-17 ENCOUNTER — Ambulatory Visit (INDEPENDENT_AMBULATORY_CARE_PROVIDER_SITE_OTHER): Payer: Medicare Other | Admitting: Licensed Clinical Social Worker

## 2020-05-17 DIAGNOSIS — F419 Anxiety disorder, unspecified: Secondary | ICD-10-CM

## 2020-05-17 NOTE — BH Specialist Note (Signed)
Integrated Behavioral Health Visit via Telemedicine (Telephone)  05/17/2020 Aimee Davis [id number]   Session Start time: 11;00  Session End time: 11;30 Total time: 30 minutes  Referring Provider: Dr. Coy Saunas Type of Visit: Telephonic Patient location: Home [facility] Provider location: Office All persons participating in visit: Patient and [facility]  Discussed confidentiality: Yes   "By engaging in this telephone visit, you consent to the provision of healthcare.  Additionally, you authorize for your insurance to be billed for the services provided during this telephone visit."   Patient and/or legal guardian consented to telephone visit: Yes   Confirmed patient's address: Yes  Confirmed patient's phone number: Yes  Any changes to demographics: No   The following statements were read to the patient and/or legal guardian that are established with the [facility] Provider.  "The purpose of this phone visit is to provide behavioral health care while limiting exposure to the coronavirus (COVID19).  There is a possibility of technology failure and discussed alternative modes of communication if that failure occurs."   PRESENTING CONCERNS: Patient and/or family reports the following symptoms/concerns: anxiety,loneliness,health concerns, issues with decision making. Duration of problem: one year; Severity of problem: mild  GOALS ADDRESSED: Patient will: 1.  Reduce symptoms of: anxiety, depression and stress  2.  Increase knowledge and/or ability of: coping skills, healthy habits, stress reduction and communication skills.  3.  Demonstrate ability to: Increase healthy adjustment to current life circumstances and Increase adequate support systems for patient/family  INTERVENTIONS: Interventions utilized:  Motivational Interviewing, Brief CBT and Supportive Counseling Standardized Assessments completed: Not Needed  ASSESSMENT: Patient currently experiencing mild levels of anxiety.  Patient processed interactions with others that she struggled with communication. Patient identified that she is still trying to improve her conversational skills with others. Patient identified this as a source of distress when she speaks impulsively. Patient was encouraged to use these situations as opportunities for growth.    Patient may benefit from continued counseling.  PLAN: 1. Follow up with behavioral health clinician on : one week.   Dessie Coma, [facility], [place]

## 2020-05-24 ENCOUNTER — Ambulatory Visit (INDEPENDENT_AMBULATORY_CARE_PROVIDER_SITE_OTHER): Payer: Medicare Other | Admitting: Licensed Clinical Social Worker

## 2020-05-24 ENCOUNTER — Other Ambulatory Visit: Payer: Self-pay

## 2020-05-24 DIAGNOSIS — F419 Anxiety disorder, unspecified: Secondary | ICD-10-CM

## 2020-05-24 NOTE — Progress Notes (Signed)
Integrated Behavioral Health Visit via Telemedicine (Telephone)  05/24/2020 Aimee Davis [id number]   Session Start time: 11:30  Session End time: 12:00 Total time: 30 minutes  Type of Service: Individual Visit Type: Telephonic Referring Provider: Dr. Coy Saunas  Patient location: Home [facility] Provider location: Office All persons participating in visit: Patient and [facility]  Discussed confidentiality: Yes  Confirmed patient's address: Yes  Confirmed patient's phone number: Yes  Any changes to demographics: No   The following statements were read to the patient and/or legal guardian that are established with the [facility] Provider.  "The purpose of this phone visit is to provide behavioral health care while limiting exposure to the coronavirus (COVID19).  There is a possibility of technology failure and discussed alternative modes of communication if that failure occurs."    "By engaging in this telephone visit, you consent to the provision of healthcare.  Additionally, you authorize for your insurance to be billed for the services provided during this telephone visit."   Patient and/or legal guardian consented to telephone visit: Yes   PRESENTING CONCERNS: Patient and/or family reports the following symptoms/concerns: anxiety,loneliness,health concerns, issues with decision making. Duration of problem: one year; Severity of problem: mild-moderate  STRENGTHS (Protective Factors/Coping Skills): Social and Emotional competence and Sense of purpose  ASSESSMENT: Patient currently experiencing mild levels of anxiety, especially in social situations. Patient processed a recent situation where she felt that she spoke impulsively, which caused her to replay the conversation in her mind afterwards. Patient was encouraged to use past experiences as ways to improve, and not to create shame.     GOALS ADDRESSED: Patient will: 1.  Reduce symptoms of: anxiety and stress  2.  Increase  knowledge and/or ability of: coping skills, healthy habits and stress reduction  3.  Demonstrate ability to: Increase healthy adjustment to current life circumstances  Progress of Goals: Ongoing  INTERVENTIONS: Interventions utilized:  Motivational Interviewing, Brief CBT and Supportive Counseling. Setting boundaries and communication skills. Standardized Assessments completed & reviewed: Not Needed  PLAN: 1. Follow up with behavioral health clinician on : one week.   Dessie Coma, [facility], [place]

## 2020-05-25 ENCOUNTER — Encounter: Payer: Self-pay | Admitting: Licensed Clinical Social Worker

## 2020-05-31 ENCOUNTER — Telehealth: Payer: Self-pay | Admitting: Student

## 2020-05-31 DIAGNOSIS — J4541 Moderate persistent asthma with (acute) exacerbation: Secondary | ICD-10-CM

## 2020-05-31 NOTE — Telephone Encounter (Signed)
Pt contact pt regarding sinus issues [phone]

## 2020-05-31 NOTE — Telephone Encounter (Signed)
Return pt's call - c/o nasal congestion at night mainly and puffy eyes when she wakes up. Stated she uses a warm cloth at night over her nose which helps. I asked if she has a humidifier, stated she does and even has changed the filter. She 's taking Zyrtec; using nasal spray which has not helped. She wants to know is there's anything else she can try? Thanks

## 2020-05-31 NOTE — Telephone Encounter (Signed)
Called pt - stated she's unsure of the name of nasal spray (not at home) ; stated it was the new one the doctor ordered, not Nasonex when I asked. Stated she will call back when she gets home.  Also let her know to try different med such as Allegra, Claritin, Xyzal - stated she had taken Allegra before so she try it again. Also needs a refill on Advair.

## 2020-05-31 NOTE — Telephone Encounter (Signed)
What kind of nasal spray is she using? There are different types and some will help more than others. She can also switch the Zyrtec to a similar but different medication to see if this helps more.

## 2020-06-01 ENCOUNTER — Ambulatory Visit: Payer: Medicare Other | Admitting: Licensed Clinical Social Worker

## 2020-06-01 ENCOUNTER — Encounter: Payer: Self-pay | Admitting: Licensed Clinical Social Worker

## 2020-06-01 ENCOUNTER — Telehealth: Payer: Self-pay | Admitting: Licensed Clinical Social Worker

## 2020-06-01 MED ORDER — FLUTICASONE-SALMETEROL 250-50 MCG/DOSE IN AEPB: 1.0000 | INHALATION_SPRAY | Freq: Two times a day (BID) | RESPIRATORY_TRACT | 0 refills | Status: DC

## 2020-06-01 NOTE — Telephone Encounter (Signed)
Patient was called twice for her scheduled visit. A vm was left both times for the patient. Since this was my last appointment with the patient, and I was unable to complete this visit. I plan to mail the patient a letter with resources.

## 2020-06-01 NOTE — Telephone Encounter (Signed)
Refilled medication. Ok, thank you. Nasonex is the only one I see on there as well.

## 2020-06-02 ENCOUNTER — Telehealth: Payer: Self-pay

## 2020-06-02 NOTE — Telephone Encounter (Signed)
Received call from patient who states she is using the mometasone nasal spray and within 69min to an hour after using it, she develops a h/a and hoarseness.  Patient insisting she has only used brand name nasonex in the past and this is the first time she has used the generic, mometasone.  Will forward to PCP/red team for consideration of resending RX with "Brand name Only" noted on RX.  Pt informed that if provider is in agreement, insurance may not cover this or may require PA.  Also informed patient that MD may choose to change medication completely and she verbalized understanding. SChaplin, RN,BSN

## 2020-06-02 NOTE — Telephone Encounter (Signed)
Requesting to speak with a nurse about mometasone (NASONEX) 50 MCG/ACT nasal spray. Pt states this med cause her to have headache. Please call pt back.

## 2020-06-03 ENCOUNTER — Other Ambulatory Visit: Payer: Self-pay | Admitting: Student

## 2020-06-03 MED ORDER — MOMETASONE FUROATE 50 MCG/ACT NA SUSP
NASAL | 6 refills | Status: DC
Start: 2020-06-03 — End: 2020-08-17

## 2020-06-03 NOTE — Telephone Encounter (Signed)
Reordered patient's mometasone with her pharmacy to only refill with brand name Nasonex.

## 2020-06-14 ENCOUNTER — Other Ambulatory Visit: Payer: Self-pay | Admitting: Obstetrics and Gynecology

## 2020-06-14 ENCOUNTER — Other Ambulatory Visit: Payer: Self-pay | Admitting: Student

## 2020-06-14 DIAGNOSIS — Z1231 Encounter for screening mammogram for malignant neoplasm of breast: Secondary | ICD-10-CM

## 2020-06-17 ENCOUNTER — Encounter: Payer: Medicare Other | Admitting: Student

## 2020-06-17 ENCOUNTER — Ambulatory Visit
Admission: RE | Admit: 2020-06-17 | Discharge: 2020-06-17 | Disposition: A | Discharge status: Home / Self Care | Payer: Medicare Other | Source: Ambulatory Visit | Attending: *Deleted | Admitting: *Deleted

## 2020-06-17 ENCOUNTER — Other Ambulatory Visit: Payer: Self-pay

## 2020-06-17 DIAGNOSIS — Z1231 Encounter for screening mammogram for malignant neoplasm of breast: Secondary | ICD-10-CM | POA: Diagnosis not present

## 2020-06-20 ENCOUNTER — Other Ambulatory Visit: Payer: Self-pay

## 2020-06-20 ENCOUNTER — Encounter: Payer: Self-pay | Admitting: Student

## 2020-06-20 ENCOUNTER — Ambulatory Visit (INDEPENDENT_AMBULATORY_CARE_PROVIDER_SITE_OTHER): Payer: Medicare Other | Admitting: Student

## 2020-06-20 VITALS — BP 117/59 | HR 68 | Temp 97.9°F | Ht 63.5 in | Wt 159.6 lb

## 2020-06-20 DIAGNOSIS — Z0000 Encounter for general adult medical examination without abnormal findings: Secondary | ICD-10-CM

## 2020-06-20 DIAGNOSIS — K589 Irritable bowel syndrome without diarrhea: Secondary | ICD-10-CM | POA: Diagnosis not present

## 2020-06-20 DIAGNOSIS — Z23 Encounter for immunization: Secondary | ICD-10-CM

## 2020-06-20 DIAGNOSIS — J4540 Moderate persistent asthma, uncomplicated: Secondary | ICD-10-CM | POA: Diagnosis not present

## 2020-06-20 DIAGNOSIS — R5383 Other fatigue: Secondary | ICD-10-CM | POA: Diagnosis not present

## 2020-06-20 DIAGNOSIS — D709 Neutropenia, unspecified: Secondary | ICD-10-CM

## 2020-06-20 DIAGNOSIS — R5382 Chronic fatigue, unspecified: Secondary | ICD-10-CM

## 2020-06-20 DIAGNOSIS — E785 Hyperlipidemia, unspecified: Secondary | ICD-10-CM | POA: Diagnosis not present

## 2020-06-20 DIAGNOSIS — R944 Abnormal results of kidney function studies: Secondary | ICD-10-CM | POA: Diagnosis not present

## 2020-06-20 DIAGNOSIS — F323 Major depressive disorder, single episode, severe with psychotic features: Secondary | ICD-10-CM

## 2020-06-20 NOTE — Assessment & Plan Note (Signed)
Patient states she does not feel depressed. Her PHQ-9 score was only 3 today, with this score corresponding to patient feeling tired or having little energy nearly everyday. Will continue to monitor and find an organic cause of her excessive fatigue.

## 2020-06-20 NOTE — Progress Notes (Signed)
   CC: Follow-up  HPI:  Aimee Davis is a 64 y.o. female with PMH of IBS, Asthma, COPD, and GERD who presents for chronic fatigue. Patient states she has felt more fatigue since she had covid last year. She endorse some SOB and recent weight gain but denies any night sweats, headache, abdominal pain, chest pain, fever, or chills.   Please see problem based charting for evaluation, assessment and plan.  Past Medical History:  Diagnosis Date  . Allergy    seasonal, PCN, antidepressants, bentyl  . Arthritis   . Asthma   . Carpal tunnel syndrome on both sides    right worse than left 2008  . Cataract 06/11/2013  . COPD (chronic obstructive pulmonary disease) ([place])   . COVID-19 virus infection 06/18/2016  . Eczema 05/02/2018  . GERD (gastroesophageal reflux disease)   . Hyperlipidemia    LDL 108 JULY 2013  . IBS (irritable bowel syndrome)   . Multiple nevi 03/01/2015  . Pneumonia   . Positive TB test    From MArch 2009 note : Recent F/u at [facility]. CXRAy negative.  Positive TST in 2007 (36mm).  Unable to tolerate INH due to hepatotoxicity   Review of Systems:  All ROS negative otherwise as stated in the HPI  Physical Exam:  General: Pleasant elderly woman sitting in chair. No acute distress. Well nourished, well developed. Head: Normocephalic, atraumatic Cardiac: RRR. No murmurs, rubs or gallops. S1, S2. No lower extremities edema Respiratory: Lungs CTAB. No wheezing or crackles. No increased WOB Abdominal: Soft, symmetric and non tender. No organomegaly. Normal bowel sounds Skin: Warm and dry. Multiple hyperpigmented macules on both arms.  Extremities: Atraumatic. Full ROM. Pulse palpable. Neuro: A&O x 3. Moves all extremities Psych: Appropriate mood and affect. Normal judgement  Vitals:   06/20/20 1412  BP: (!) 117/59  Pulse: 68  Temp: 97.9 F (36.6 C)  TempSrc: Oral  SpO2: 100%  Weight: 159 lb 9.6 oz (72.4 kg)  Height: 5' 3.5" (1.613 m)    Assessment & Plan:    See Encounters Tab for problem based charting.  Patient seen with Dr. Michelle Nasuti, MD, MPH

## 2020-06-20 NOTE — Assessment & Plan Note (Signed)
Patient reports excessive fatigue on and off since she contracted the covid virus last year. She has had extensive work up for this but no organic cause has been found. This symptom in addition to the intermittent SOB has interfered with patient's daily activities. Will assess lung function with PFT and refer patient for pulmonary rehab. Will consider evaluating for a possible malignancy if symptoms do not improve or worsens due to patient's family history of breast cancer (2 sisters).   Plan: --Continue daily multivitamins --PFT --Repeat TSH and B12 at next office visit.

## 2020-06-20 NOTE — Assessment & Plan Note (Signed)
Patient reports intermittent shortness of breath that is worse when she exerts herself. Patient states she is doing better on the Advair diskus (250-50). She has been using her albuterol inhaler only about 3-4 times a month and she has not had any hoarseness. Patient states she was never called to schedule her PFT appointment.   Plan: --Continue Advair 250-50 mcg 1 puff BID --Continue albuterol inhaler q6h prn --Re-ordered PFT

## 2020-06-20 NOTE — Assessment & Plan Note (Signed)
Patient has a history of isolated low white count. States many of her immediate family members had a similar problem. Patient denies any B-symptoms. Will continue to monitor with CBC w/ diff.

## 2020-06-20 NOTE — Assessment & Plan Note (Signed)
Patient had screening mammogram done on 06/17/20 and pending results. Patient received her flu vaccine today with no complications.

## 2020-06-20 NOTE — Assessment & Plan Note (Signed)
Stable. No recent significant flare ups. Patient is on probiotics and gas-X as needed. Will continue to monitor.

## 2020-06-20 NOTE — Patient Instructions (Addendum)
Thank you, Ms.[place] C Koslowski for allowing [place] to provide your care today. Today we discussed your fatigue and they are intermittent shortness of breath.  We have ordered a test to assess your lungs so you should get a phone call to set up the appointment. We have also ordered some labs and will call you about the results.  I have ordered the following labs for you:   Lab Orders     BMP8+Anion Gap     CBC with Diff     Lipid Profile   I will call if any are abnormal. All of your labs can be accessed through "My Chart".  I have ordered the following tests: Pulmonary Functional Test (PFT)   Please follow-up in 1 months.   Should you have any questions or concerns please call the internal medicine clinic at [phone].    Linwood Dibbles, MD, MPH [facility]   My Chart Access: [email or web address]   If you have not already done so, please get your COVID 19 vaccine  To schedule an appointment for a COVID vaccine choice any of the following: Go to [email or web address]   Go to [email or web address]                  Call [phone]                                     Call [phone] and select Option 2

## 2020-06-21 ENCOUNTER — Telehealth: Payer: Self-pay

## 2020-06-21 LAB — CBC WITH DIFFERENTIAL/PLATELET
Basophils Absolute: 0 10*3/uL (ref 0.0–0.2)
Basos: 1 %
EOS (ABSOLUTE): 0.1 10*3/uL (ref 0.0–0.4)
Eos: 2 %
Hematocrit: 38.3 % (ref 34.0–46.6)
Hemoglobin: 12.5 g/dL (ref 11.1–15.9)
Immature Grans (Abs): 0 10*3/uL (ref 0.0–0.1)
Immature Granulocytes: 0 %
Lymphocytes Absolute: 0.7 10*3/uL (ref 0.7–3.1)
Lymphs: 21 %
MCH: 27.1 pg (ref 26.6–33.0)
MCHC: 32.6 g/dL (ref 31.5–35.7)
MCV: 83 fL (ref 79–97)
Monocytes Absolute: 0.3 10*3/uL (ref 0.1–0.9)
Monocytes: 9 %
Neutrophils Absolute: 2.2 10*3/uL (ref 1.4–7.0)
Neutrophils: 67 %
Platelets: 217 10*3/uL (ref 150–450)
RBC: 4.62 x10E6/uL (ref 3.77–5.28)
RDW: 13.1 % (ref 11.7–15.4)
WBC: 3.3 10*3/uL — ABNORMAL LOW (ref 3.4–10.8)

## 2020-06-21 LAB — BMP8+ANION GAP
Anion Gap: 15 mmol/L (ref 10.0–18.0)
BUN/Creatinine Ratio: 25 (ref 12–28)
BUN: 16 mg/dL (ref 8–27)
CO2: 24 mmol/L (ref 20–29)
Calcium: 9.7 mg/dL (ref 8.7–10.3)
Chloride: 104 mmol/L (ref 96–106)
Creatinine, Ser: 0.65 mg/dL (ref 0.57–1.00)
GFR calc Af Amer: 108 mL/min/{1.73_m2} (ref >59)
GFR calc non Af Amer: 93 mL/min/{1.73_m2} (ref >59)
Glucose: 77 mg/dL (ref 65–99)
Potassium: 4.4 mmol/L (ref 3.5–5.2)
Sodium: 143 mmol/L (ref 134–144)

## 2020-06-21 LAB — LIPID PANEL
Chol/HDL Ratio: 2 ratio (ref 0.0–4.4)
Cholesterol, Total: 209 mg/dL — ABNORMAL HIGH (ref 100–199)
HDL: 104 mg/dL (ref >39)
LDL Chol Calc (NIH): 80 mg/dL (ref 0–99)
Triglycerides: 155 mg/dL — ABNORMAL HIGH (ref 0–149)
VLDL Cholesterol Cal: 25 mg/dL (ref 5–40)

## 2020-06-21 NOTE — Telephone Encounter (Signed)
Requesting test results, please call pt back.  

## 2020-06-23 NOTE — Telephone Encounter (Signed)
I called patient to inform her of test results 2 days ago.

## 2020-06-23 NOTE — Progress Notes (Signed)
Internal Medicine Clinic Attending  I saw and evaluated the patient.  I personally confirmed the key portions of the history and exam documented by Dr. Amponsah and I reviewed pertinent patient test results.  The assessment, diagnosis, and plan were formulated together and I agree with the documentation in the resident's note.  

## 2020-07-18 ENCOUNTER — Telehealth: Payer: Self-pay | Admitting: *Deleted

## 2020-07-18 NOTE — Telephone Encounter (Signed)
Call to patient given appointment for PFT on 07/21/2020 at 10:00 AM.  Patient to arrive by 9:45 in Admitting register.  Patient was also given appointment for Covid Testing on 07/19/2020 on 07/19/2020 at 9:45 AM.  Address given for Covid Testing at [place].  Patient voiced understanding of the information given.  Sander Nephew, RN 07/18/2020 10:60 AM.

## 2020-07-19 ENCOUNTER — Other Ambulatory Visit (HOSPITAL_COMMUNITY)
Admission: RE | Admit: 2020-07-19 | Discharge: 2020-07-19 | Disposition: A | Discharge status: Home / Self Care | Payer: Medicare Other | Source: Ambulatory Visit | Attending: Internal Medicine | Admitting: Internal Medicine

## 2020-07-19 DIAGNOSIS — Z01812 Encounter for preprocedural laboratory examination: Secondary | ICD-10-CM | POA: Insufficient documentation

## 2020-07-19 DIAGNOSIS — Z20822 Contact with and (suspected) exposure to covid-19: Secondary | ICD-10-CM | POA: Diagnosis not present

## 2020-07-19 LAB — SARS CORONAVIRUS 2 (TAT 6-24 HRS): SARS Coronavirus 2: NEGATIVE

## 2020-07-21 ENCOUNTER — Other Ambulatory Visit: Payer: Self-pay

## 2020-07-21 ENCOUNTER — Ambulatory Visit (HOSPITAL_COMMUNITY)
Admission: RE | Admit: 2020-07-21 | Discharge: 2020-07-21 | Disposition: A | Discharge status: Home / Self Care | Payer: Medicare Other | Source: Ambulatory Visit | Attending: Internal Medicine | Admitting: Internal Medicine

## 2020-07-21 DIAGNOSIS — J4540 Moderate persistent asthma, uncomplicated: Secondary | ICD-10-CM

## 2020-07-21 LAB — PULMONARY FUNCTION TEST
DL/VA % pred: 69 %
DL/VA: 2.9 ml/min/mmHg/L
DLCO unc % pred: 53 %
DLCO unc: 10.35 ml/min/mmHg
FEF 25-75 Post: 0.74 L/sec
FEF 25-75 Pre: 0.51 L/sec
FEF2575-%Change-Post: 45 %
FEF2575-%Pred-Post: 40 %
FEF2575-%Pred-Pre: 27 %
FEV1-%Change-Post: 13 %
FEV1-%Pred-Post: 69 %
FEV1-%Pred-Pre: 61 %
FEV1-Post: 1.34 L
FEV1-Pre: 1.18 L
FEV1FVC-%Change-Post: 3 %
FEV1FVC-%Pred-Pre: 72 %
FEV6-%Change-Post: 12 %
FEV6-%Pred-Post: 93 %
FEV6-%Pred-Pre: 83 %
FEV6-Post: 2.22 L
FEV6-Pre: 1.97 L
FEV6FVC-%Change-Post: 1 %
FEV6FVC-%Pred-Post: 100 %
FEV6FVC-%Pred-Pre: 99 %
FVC-%Change-Post: 10 %
FVC-%Pred-Post: 92 %
FVC-%Pred-Pre: 83 %
FVC-Post: 2.29 L
FVC-Pre: 2.07 L
Post FEV1/FVC ratio: 59 %
Post FEV6/FVC ratio: 97 %
Pre FEV1/FVC ratio: 57 %
Pre FEV6/FVC Ratio: 95 %
RV % pred: 137 %
RV: 2.84 L
TLC % pred: 100 %
TLC: 5 L

## 2020-07-21 MED ORDER — ALBUTEROL SULFATE (2.5 MG/3ML) 0.083% IN NEBU
2.5000 mg | INHALATION_SOLUTION | Freq: Once | RESPIRATORY_TRACT | Status: AC
  Administered 2020-07-21: 2.5 mg via RESPIRATORY_TRACT

## 2020-08-03 ENCOUNTER — Telehealth: Payer: Self-pay | Admitting: Gastroenterology

## 2020-08-03 NOTE — Telephone Encounter (Signed)
Spoke with patient in regards to Dr. Doyne Keel recommendations, pt has been scheduled for a follow up on 08/05/20 at 8:30 AM. Advised patient to continue IB-Gard and Miralax until appt. Patient verbalized understanding and had no concerns at the end of the call

## 2020-08-03 NOTE — Telephone Encounter (Signed)
Patient called states she is having IBS symptoms for two weeks seeking advise

## 2020-08-03 NOTE — Telephone Encounter (Signed)
Spoke with patient, she reports irritation in the morning, began last week, she states that every day it seems like its getting worse, reports abdominal spasms, she states that she has been taking IB-Gard since last office visit, she is taking 1 dose of Miralax daily before a meal, then IB-Gard 30 minutes after eating. She reports that she has a lack of appetite, not able to tolerate any heavy foods - has only been eating applesauce today, pt states that she had a long-skinny beige colored bowel movement about an hour ago. She states that she is also taking organic liquid probiotic. I asked patient had she eaten anything new or that she doesn't normally eat around the time her symptoms started and she states that she ate a sauce that had some spice to it, she states that she didn't eat much of it and that's when she started noticing her symptoms return. Please advise, thank you

## 2020-08-03 NOTE — Telephone Encounter (Signed)
Thanks [place], sorry to hear this.  She has chronic symptoms, this is not new.  She is had a prior CT scan this year which looked okay.  I think it would best to evaluate the symptoms in the office, she may benefit from a TCA or other type of pain modulator.  Can book with me or PA, first available.  Thanks

## 2020-08-05 ENCOUNTER — Ambulatory Visit (INDEPENDENT_AMBULATORY_CARE_PROVIDER_SITE_OTHER): Payer: Medicare Other | Admitting: Gastroenterology

## 2020-08-05 ENCOUNTER — Encounter: Payer: Self-pay | Admitting: Gastroenterology

## 2020-08-05 VITALS — BP 118/66 | HR 67 | Ht 63.5 in | Wt 153.6 lb

## 2020-08-05 DIAGNOSIS — R140 Abdominal distension (gaseous): Secondary | ICD-10-CM

## 2020-08-05 DIAGNOSIS — K589 Irritable bowel syndrome without diarrhea: Secondary | ICD-10-CM | POA: Diagnosis not present

## 2020-08-05 MED ORDER — POLYETHYLENE GLYCOL 3350 17 G PO PACK: 17.0000 g | PACK | Freq: Every day | ORAL | 0 refills | Status: DC

## 2020-08-05 NOTE — Patient Instructions (Addendum)
If you are age 66 or older, your body mass index should be between 23-30. Your Body mass index is 26.78 kg/m. If this is out of the aforementioned range listed, please consider follow up with your Primary Care Provider.  If you are age 33 or younger, your body mass index should be between 19-25. Your Body mass index is 26.78 kg/m. If this is out of the aformentioned range listed, please consider follow up with your Primary Care Provider.   Please discontinue Linzess.  Take Miralax as directed Twice daily and increase frequency or decrease as needed.  We have given you samples of the following medication to take: IBguard:  Take every night and every morning and also as needed Gas-X: Take as directed as needed  We are giving you a Low FOD-MAP diet follow.   Thank you for entrusting me with your care and for choosing [facility], Dr. Blacklake Cellar

## 2020-08-05 NOTE — Progress Notes (Signed)
HPI :  64 year old female here for a follow-up visit, history of IBS, abdominal discomfort.  See prior notes for full details of her history.  The patient has had longstanding lower abdominal cramping around her umbilicus to lower abdomen.  Previously she had an extensive work-up at [facility], she had a negative CT scan in 2013 in 2011 for this issue.  She has had negative colonoscopies in the past, her last exam was done by me in March 2020 without any high risk or concerning pathology.    Since her last visit we repeated a CT scan in April 2021 which showed no pathology to account for her symptoms.  We previously had her on Linzess for chronic constipation dosed at 72 mcg/day.  She states it is too strong for her and causes diarrhea so she ended up stopping it.  She has had some benefit with Levsin in the past but had side effects from it, felt anxious and confused and did not like the way it made her feel so she stopped it.  We had tried her on some IBgard in the interim.  She states it helps but has only been using it once daily.  She started using MiraLAX, dosed at once daily, it does help with her constipation but she does not think it strong enough at that dose.  She has not tried higher dosing yet.  If she does not take anything for her bowels she will feel the stool is " stringy", sense of incomplete evacuation, sense of straining.  She does have occasional abdominal cramps that bother her in her lower abdomen.  This often happens in the morning.  She has questions about diet, she is considering eliminating meat from her diet and going mostly vegetarian with fish and asks how that may affect her bowel.  She has been using Gas-X as needed for bloating which does help as well.  Weight has been stable since of last seen her.  Most recent work-up: CT scan abdomen / pelvis 12/17/19 - IMPRESSION: No acute findings in the abdomen or pelvis. Specifically, no findings to explain the patient's history  of abdominal pain and weight loss.   Colonoscopy 11/26/18 - The perianal and digital rectal examinations were normal. - A diminutive polyp was found in the cecum. The polyp was sessile. The polyp was removed with a cold snare. Resection and retrieval were complete. - The colon was tortuous. - Internal hemorrhoids were found during retroflexion. The hemorrhoids were small. - The exam was otherwise without abnormality. Of note the AO was normal, I thought a photo was taken of it, but was not.     Past Medical History:  Diagnosis Date  . Allergy    seasonal, PCN, antidepressants, bentyl  . Arthritis   . Asthma   . Carpal tunnel syndrome on both sides    right worse than left 2008  . Cataract 06/11/2013  . COPD (chronic obstructive pulmonary disease) ([place])   . COVID-19 virus infection 06/18/2016  . Eczema 05/02/2018  . GERD (gastroesophageal reflux disease)   . Hyperlipidemia    LDL 108 JULY 2013  . IBS (irritable bowel syndrome)   . Multiple nevi 03/01/2015  . Pneumonia   . Positive TB test    From [facility] 2009 note : Recent F/u at [facility]. CXRAy negative.  Positive TST in 2007 (35mm).  Unable to tolerate INH due to hepatotoxicity     Past Surgical History:  Procedure Laterality Date  . BREAST CYST  EXCISION Right 2011  . BREAST EXCISIONAL BIOPSY     benign  . BREAST EXCISIONAL BIOPSY Right 1990   benign  . INDUCED ABORTION  2005  . VAGINAL HYSTERECTOMY  1983   partial; for abnormal bleeding   Family History  Problem Relation Age of Onset  . Hypertension Mother   . Heart disease Mother   . Diabetes Mother   . Kidney disease Mother   . Stroke Father   . Diabetes Father   . Heart disease Father   . Hyperlipidemia Father   . Hypertension Father   . Kidney disease Father   . Cancer Sister        breast  . Diabetes Sister   . Kidney disease Sister   . Hypertension Sister   . Breast cancer Sister   . Mental illness Brother   . Hypertension Maternal Aunt   .  Hypertension Maternal Uncle   . Stomach cancer Maternal Uncle   . Hypertension Paternal Aunt   . Kidney disease Paternal Aunt   . Hypertension Paternal Uncle   . Kidney disease Paternal Uncle   . Breast cancer Sister   . Colon cancer Neg Hx   . Esophageal cancer Neg Hx   . Pancreatic cancer Neg Hx    Social History   Tobacco Use  . Smoking status: Former Smoker    Packs/day: 0.33    Years: 43.00    Pack years: 14.19    Types: Cigarettes    Quit date: 01/06/2013    Years since quitting: 7.5  . Smokeless tobacco: Never Used  Vaping Use  . Vaping Use: Never used  Substance Use Topics  . Alcohol use: No    Alcohol/week: 0.0 standard drinks  . Drug use: Not Currently    Comment: x 26 yrs.   Current Outpatient Medications  Medication Sig Dispense Refill  . albuterol (VENTOLIN HFA) 108 (90 Base) MCG/ACT inhaler Inhale 2 puffs into the lungs every 6 (six) hours as needed for wheezing or shortness of breath. 8 g 3  . Artificial Tear Ointment (DRY EYES OP) Apply 1 drop to eye daily as needed (dry eyes).    . Ascorbic Acid (VITAMIN C) 100 MG tablet Take 50 mg by mouth daily.    . B Complex-C (B-COMPLEX WITH VITAMIN C) tablet Take 1 tablet by mouth daily.    . cetirizine (ZYRTEC) 10 MG tablet Take 10 mg by mouth daily.    . Cholecalciferol (VITAMIN D) 125 MCG (5000 UT) CAPS Take 1-2 tablets by mouth daily.    . mometasone (NASONEX) 50 MCG/ACT nasal spray PLACE 2 SPRAYS IN EACH NOSTRIL ONCE DAILY AS NEEDED. 17 g 6  . OVER THE COUNTER MEDICATION MICROTEX VM - FOOD NUTRIENT COMPLEX  2 DAY    . OVER THE COUNTER MEDICATION EO MEGA Essential Oil - 2 daily    . Peppermint Oil (IBGARD) 90 MG CPCR Take 1 capsule by mouth 2 (two) times daily as needed. Take as directed as needed    . Probiotic Product (PROBIOTIC DAILY PO) Take by mouth.    . simethicone (GAS-X) 80 MG chewable tablet Use as directed as needed 30 tablet 0  . Fluticasone-Salmeterol (ADVAIR DISKUS) 250-50 MCG/DOSE AEPB Inhale 1 puff  into the lungs 2 (two) times daily. 60 each 0  . linaclotide (LINZESS) 72 MCG capsule Take 1 capsule (72 mcg total) by mouth daily before breakfast. (Patient not taking: Reported on 08/05/2020) 30 capsule 5  . pantoprazole (PROTONIX) 40 MG  tablet Take 1 tablet (40 mg total) by mouth daily. (Patient not taking: Reported on 08/05/2020) 90 tablet 11   No current facility-administered medications for this visit.   Allergies  Allergen Reactions  . Penicillins Other (See Comments)    REACTION: rash  . Levsin [Hyoscyamine] Anxiety     Review of Systems: All systems reviewed and negative except where noted in HPI.   Lab Results  Component Value Date   WBC 3.3 (L) 06/20/2020   HGB 12.5 06/20/2020   HCT 38.3 06/20/2020   MCV 83 06/20/2020   PLT 217 06/20/2020    Lab Results  Component Value Date   ALT 20 10/26/2019   AST 21 10/26/2019   ALKPHOS 56 10/26/2019   BILITOT 0.4 10/26/2019    Lab Results  Component Value Date   CREATININE 0.65 06/20/2020   BUN 16 06/20/2020   NA 143 06/20/2020   K 4.4 06/20/2020   CL 104 06/20/2020   CO2 24 06/20/2020     Physical Exam: BP 118/66   Pulse 67   Ht 5' 3.5" (1.613 m)   Wt 153 lb 9.6 oz (69.7 kg)   BMI 26.78 kg/m  Constitutional: Pleasant,well-developed, female in no acute distress. HEENT: Normocephalic and atraumatic. Conjunctivae are normal. No scleral icterus. Neck supple.  Cardiovascular: Normal rate, regular rhythm.  Pulmonary/chest: Effort normal and breath sounds normal.  Abdominal: Soft, nondistended, nontender.  There are no masses palpable.  Extremities: no edema Lymphadenopathy: No cervical adenopathy noted. Neurological: Alert and oriented to person place and time. Skin: Skin is warm and dry. No rashes noted. Psychiatric: Normal mood and affect. Behavior is normal.   ASSESSMENT AND PLAN: 64 year old female here for reassessment of the following:  IBS / bloating - she has had a pretty good work-up over the  past for these longstanding symptoms, colonoscopy without clear abnormality as well as CT scan.  Her labs are normal.  I discussed with her that she likely has a functional bowel disorder, likely IBS.  We discussed options.  The Linzess has been too strong for her even at low-dose, causes diarrhea so we will stop that.  Regarding management of baseline constipation would recommend MiraLAX.  She is currently dosed at once daily with partial benefit, recommend she titrate this up to twice daily and see how that goes.  Moving forward she can titrate that up or down based on her course until she find a good dose that works for her.  IBgard has helped her, recommend she take it nightly and every morning, and then otherwise throughout the day as needed.  She inquires about dietary changes and wants to reduce her meat intake which is fine.  We will see how this affects her bowels.  If she eats more fruits and vegetables, could contribute to gas and bloating.  I will give her a handout about a low FODMAP diet to avoid trigger foods in regards to bloating and gas.  She has been using Gas-X as needed which also provides some benefit.  I provided reassurance for her today, hopefully this regimen over time will help her.  She can follow-up with me as needed if symptoms persist or she is not happy with the regimen.  She agreed with the plan.  McBain Cellar, MD [facility]

## 2020-08-16 ENCOUNTER — Telehealth: Payer: Self-pay | Admitting: *Deleted

## 2020-08-16 NOTE — Telephone Encounter (Signed)
Pt calls and is having problems w/ her inhalers. She states pharmacy gave her the wrong ones and the ones she has are not helping very much. She is ask to bring all meds w/ her to appt and appt is set for 12/1 at 1415

## 2020-08-17 ENCOUNTER — Encounter: Payer: Self-pay | Admitting: Student

## 2020-08-17 ENCOUNTER — Ambulatory Visit (INDEPENDENT_AMBULATORY_CARE_PROVIDER_SITE_OTHER): Payer: Medicare Other | Admitting: Student

## 2020-08-17 VITALS — BP 119/72 | HR 86 | Temp 98.6°F | Wt 160.9 lb

## 2020-08-17 DIAGNOSIS — J302 Other seasonal allergic rhinitis: Secondary | ICD-10-CM

## 2020-08-17 DIAGNOSIS — J4540 Moderate persistent asthma, uncomplicated: Secondary | ICD-10-CM | POA: Diagnosis not present

## 2020-08-17 MED ORDER — MOMETASONE FUROATE 50 MCG/ACT NA SUSP: NASAL | 6 refills | Status: DC

## 2020-08-17 MED ORDER — TRELEGY ELLIPTA 200-62.5-25 MCG/INH IN AEPB: 1.0000 | INHALATION_SPRAY | Freq: Every day | RESPIRATORY_TRACT | 0 refills | Status: DC

## 2020-08-17 NOTE — Patient Instructions (Addendum)
Aimee Davis,  It was a pleasure meeting you today in clinic.  For your asthma, we have ordered a new prescription for Trelegy (also known as fluticasone-umeclidin-vilant). This medication will replace the Advair that you previously took for your condition. If the pharmacy is unable to fill this prescription, please call our clinic so that we may help you. I'm sorry that insurance approval sometimes complicates the ability for you to receive the exact medications that we prescribe.  Sincerely, Dr. Paulla Dolly, MD

## 2020-08-17 NOTE — Progress Notes (Signed)
   CC: Refill inhalers  HPI:  Aimee Davis is a 64 y.o. with past medical history significant for moderate persistent asthma, chronic fatigue, constitutional neurtropenia and MDD who presented to clinic for refill of inhalers. Please refer to problem list for Assessment/Plan based charting of this encounter.  Past Medical History:  Diagnosis Date  . Allergy    seasonal, PCN, antidepressants, bentyl  . Arthritis   . Asthma   . Carpal tunnel syndrome on both sides    right worse than left 2008  . Cataract 06/11/2013  . COPD (chronic obstructive pulmonary disease) ([place])   . COVID-19 virus infection 06/18/2016  . Eczema 05/02/2018  . GERD (gastroesophageal reflux disease)   . Hyperlipidemia    LDL 108 JULY 2013  . IBS (irritable bowel syndrome)   . Multiple nevi 03/01/2015  . Pneumonia   . Positive TB test    From [facility] 2009 note : [facility] F[facility]u at [facility]. CXRAy negative.  Positive TST in 2007 (38mm).  Unable to tolerate INH due to hepatotoxicity   Review of Systems:  Endorses wheezing and shortness of breath. Denies chest pain, palpitations, headaches, cough, fevers, chills.  Physical Exam:  Vitals:   08/17/20 1421  BP: 119/72  Pulse: 86  Temp: 98.6 F (37 C)  TempSrc: Oral  SpO2: 99%  Weight: 160 lb 14.4 oz (73 kg)   Physical Exam Vitals and nursing note reviewed.  Cardiovascular:     Rate and Rhythm: Normal rate and regular rhythm.     Pulses: Normal pulses.     Heart sounds: Normal heart sounds.  Pulmonary:     Effort: Pulmonary effort is normal. No respiratory distress.     Breath sounds: Normal breath sounds.  Abdominal:     General: Abdomen is flat. Bowel sounds are normal.     Palpations: Abdomen is soft.     Tenderness: There is no abdominal tenderness.    Assessment & Plan:   See Encounters Tab for problem based charting.  Patient seen with Dr. Dareen Piano

## 2020-08-17 NOTE — Assessment & Plan Note (Signed)
Patient is following up today for continued evaluation and management of her asthma. Since her last appointment, she had PFTs completed which revealed moderate obstructive airway disease, airtrapping and moderately/severe diffusion defect. She reports continued shortness of breath with exertion and wheezing often in the evening. She has run out of her fluticasone-salmeterol (Wixela) inhaler and has only been using her albuterol inhaler one to two times a day for her symptoms. She states that in the past, her symptoms were very well-controlled with Advair 250-50 and albuterol inhaler PRN. Unfortunately, her insurance no longer approves the brand name medication Advair and has given her Wixela instead which she says is unacceptable. Additionally, patient was unable to tolerate a higher dose (500-100) due to dry mouth and sore throat.  On physical examination, patient is saturating at 100% on room air and lungs are clear to auscultation bilaterally.  Assessment/Plan: Patient's PFTs reveal both obstructive and restrictive disease. She does have underlying moderate, persistent asthma, however she also has a known 20-pack-year smoking history with findings of emphysema on prior CT of chest. Following long discussion with patient regarding her goals and desires for managing her condition, we have agreed to give Trelegy/Ellipta a try to see if this improves her symptoms. If insurance is unable to approve her Trelegy, she will likely need to go back to Advair (250-50, not 500-100) with a request to the pharmacy to only refill this prescription with the brand name (not generic). If she continues to not improve on either regimen, she may benefit from pulmonology consultation.

## 2020-08-22 NOTE — Progress Notes (Signed)
Internal Medicine Clinic Attending  I saw and evaluated the patient.  I personally confirmed the key portions of the history and exam documented by Dr. Johnson and I reviewed pertinent patient test results.  The assessment, diagnosis, and plan were formulated together and I agree with the documentation in the resident's note.  

## 2020-09-13 ENCOUNTER — Telehealth: Payer: Self-pay | Admitting: Gastroenterology

## 2020-09-13 NOTE — Telephone Encounter (Signed)
Pt states her stomach has been hurting a lot today. States she has to hold her stomach to walk back and forth to and from the bathroom. Reports she took a probiotic and layed down and this helped some. She also took some miralax and now her stools are a little loose. Reports the pain was just "unbearable." Pt scheduled to see Dr. Adela Lank 09/15/20@2 :10pm. Pt aware of appt.

## 2020-09-13 NOTE — Telephone Encounter (Signed)
Patient is returning your call.  

## 2020-09-13 NOTE — Telephone Encounter (Signed)
Pt is requesting a call back from a nurse to discuss her stomach pain.

## 2020-09-13 NOTE — Telephone Encounter (Signed)
Left message for pt to call back  °

## 2020-09-14 NOTE — Telephone Encounter (Signed)
Noted  

## 2020-09-15 ENCOUNTER — Other Ambulatory Visit (INDEPENDENT_AMBULATORY_CARE_PROVIDER_SITE_OTHER): Payer: Medicare Other

## 2020-09-15 ENCOUNTER — Encounter: Payer: Self-pay | Admitting: Gastroenterology

## 2020-09-15 ENCOUNTER — Ambulatory Visit (INDEPENDENT_AMBULATORY_CARE_PROVIDER_SITE_OTHER): Payer: Medicare Other | Admitting: Gastroenterology

## 2020-09-15 VITALS — BP 118/70 | HR 75 | Ht 63.5 in | Wt 157.0 lb

## 2020-09-15 DIAGNOSIS — R1013 Epigastric pain: Secondary | ICD-10-CM

## 2020-09-15 DIAGNOSIS — R110 Nausea: Secondary | ICD-10-CM

## 2020-09-15 DIAGNOSIS — K589 Irritable bowel syndrome without diarrhea: Secondary | ICD-10-CM | POA: Diagnosis not present

## 2020-09-15 LAB — COMPREHENSIVE METABOLIC PANEL
ALT: 21 U/L (ref 0–35)
AST: 23 U/L (ref 0–37)
Albumin: 4.6 g/dL (ref 3.5–5.2)
Alkaline Phosphatase: 66 U/L (ref 39–117)
BUN: 11 mg/dL (ref 6–23)
CO2: 29 mEq/L (ref 19–32)
Calcium: 9.8 mg/dL (ref 8.4–10.5)
Chloride: 106 mEq/L (ref 96–112)
Creatinine, Ser: 0.92 mg/dL (ref 0.40–1.20)
GFR: 65.11 mL/min (ref >60.00)
Glucose, Bld: 98 mg/dL (ref 70–99)
Potassium: 4.3 mEq/L (ref 3.5–5.1)
Sodium: 139 mEq/L (ref 135–145)
Total Bilirubin: 0.6 mg/dL (ref 0.2–1.2)
Total Protein: 6.6 g/dL (ref 6.0–8.3)

## 2020-09-15 LAB — CBC WITH DIFFERENTIAL/PLATELET
Basophils Absolute: 0.1 10*3/uL (ref 0.0–0.1)
Basophils Relative: 1.4 % (ref 0.0–3.0)
Eosinophils Absolute: 0 10*3/uL (ref 0.0–0.7)
Eosinophils Relative: 0.5 % (ref 0.0–5.0)
HCT: 39.7 % (ref 36.0–46.0)
Hemoglobin: 13.2 g/dL (ref 12.0–15.0)
Lymphocytes Relative: 23 % (ref 12.0–46.0)
Lymphs Abs: 0.9 10*3/uL (ref 0.7–4.0)
MCHC: 33.2 g/dL (ref 30.0–36.0)
MCV: 82.4 fl (ref 78.0–100.0)
Monocytes Absolute: 0.3 10*3/uL (ref 0.1–1.0)
Monocytes Relative: 8.5 % (ref 3.0–12.0)
Neutro Abs: 2.7 10*3/uL (ref 1.4–7.7)
Neutrophils Relative %: 66.6 % (ref 43.0–77.0)
Platelets: 246 10*3/uL (ref 150.0–400.0)
RBC: 4.82 Mil/uL (ref 3.87–5.11)
RDW: 13.3 % (ref 11.5–15.5)
WBC: 4 10*3/uL (ref 4.0–10.5)

## 2020-09-15 LAB — LIPASE: Lipase: 9 U/L — ABNORMAL LOW (ref 11.0–59.0)

## 2020-09-15 MED ORDER — SUCRALFATE 1 G PO TABS: 1.0000 g | ORAL_TABLET | Freq: Four times a day (QID) | ORAL | 3 refills | Status: DC | PRN

## 2020-09-15 MED ORDER — ONDANSETRON 4 MG PO TBDP: 4.0000 mg | ORAL_TABLET | Freq: Three times a day (TID) | ORAL | 1 refills | Status: DC | PRN

## 2020-09-15 MED ORDER — OMEPRAZOLE 40 MG PO CPDR: 40.0000 mg | DELAYED_RELEASE_CAPSULE | Freq: Every day | ORAL | 3 refills | Status: DC

## 2020-09-15 NOTE — Patient Instructions (Addendum)
If you are age 66 or older, your body mass index should be between 23-30. Your Body mass index is 27.38 kg/m. If this is out of the aforementioned range listed, please consider follow up with your Primary Care Provider.  If you are age 25 or younger, your body mass index should be between 19-25. Your Body mass index is 27.38 kg/m. If this is out of the aformentioned range listed, please consider follow up with your Primary Care Provider.   Please go to the lab in the basement of our building to have lab work done as you leave today. Hit "B" for basement when you get on the elevator.  When the doors open the lab is on your left.  We will call you with the results. Thank you.  Due to recent changes in healthcare laws, you may see the results of your imaging and laboratory studies on MyChart before your provider has had a chance to review them.  We understand that in some cases there may be results that are confusing or concerning to you. Not all laboratory results come back in the same time frame and the provider may be waiting for multiple results in order to interpret others.  Please give [place] 48 hours in order for your provider to thoroughly review all the results before contacting the office for clarification of your results.   We have sent the following medications to your pharmacy for you to pick up at your convenience: Zofran 4 mg ODT: Dissolve 1 tablet orally every 6 hours as needed Carafate tablets: Take 1 tablet every 6 hours as needed Omeprazole 40 mg: Take 1 tablet  Daily  Continue IBgard  Continue Miralax  Thank you for entrusting me with your care and for choosing [facility], Dr. Ileene Patrick

## 2020-09-15 NOTE — Progress Notes (Signed)
HPI :  64 year old female here for a follow-up visit, history of IBS, abdominal discomfort.   History of IBS. Longstanding lower abdominal cramping around her umbilicus to lower abdomen.Previously she had an extensive work-up at [facility], she had a negative CT scan in 2013 in 2011 for this issue. She has had negative colonoscopies in the past, her last exam was done by me in March 2020 without any high risk or concerning pathology.   Since her last visit we repeated a CT scan in April 2021 which showed no pathology to account for her symptoms.  Since her last visit she states she has had some acute symptoms as of this past Sunday, roughly 4 to 5 days ago.  She states she woke up feeling fatigued with chills.  She also had some new onset epigastric discomfort.  She states she has had some nausea but no vomiting.  States her epigastric area is uncomfortable after she eats something.  She is also noted some recent reflux symptoms and regurgitation which she normally does not have.  She does have some ongoing bloating and her baseline abdominal discomfort in her lower abdomen which is at baseline.  She has been maintained on MiraLAX to keep her stools moving regularly and also taking IBgard 3 times daily which she thinks has helped that.  She denies any NSAID use.  She denies any fevers.  She states she did get a Covid test done yesterday and is waiting for that test results come back.  She has been using an over-the-counter probiotic that she purchased recently and states it has not helped at all.  She otherwise uses Gas-X as needed which she thinks helps.  She is anxious and tearful in the office today.  Most recent work-up: CT scan abdomen / pelvis 12/17/19 - IMPRESSION: No acute findings in the abdomen or pelvis. Specifically, no findings to explain the patient's history of abdominal pain and weight loss.  Colonoscopy 11/26/18 - The perianal and digital rectal examinations were normal. - A  diminutive polyp was found in the cecum. The polyp was sessile. The polyp was removed with a cold snare. Resection and retrieval were complete. - The colon was tortuous. - Internal hemorrhoids were found during retroflexion. The hemorrhoids were small. - The exam was otherwise without abnormality. Of note the AO was normal, I thought a photo was taken of it, but was not.    Past Medical History:  Diagnosis Date  . Allergy    seasonal, PCN, antidepressants, bentyl  . Arthritis   . Asthma   . Carpal tunnel syndrome on both sides    right worse than left 2008  . Cataract 06/11/2013  . COPD (chronic obstructive pulmonary disease) ([place])   . COVID-19 virus infection 06/18/2016  . Eczema 05/02/2018  . GERD (gastroesophageal reflux disease)   . Hyperlipidemia    LDL 108 JULY 2013  . IBS (irritable bowel syndrome)   . Multiple nevi 03/01/2015  . Pneumonia   . Positive TB test    From [facility] 2009 note : Recent F/u at [facility]. CXRAy negative.  Positive TST in 2007 (56m).  Unable to tolerate INH due to hepatotoxicity     Past Surgical History:  Procedure Laterality Date  . BREAST CYST EXCISION Right 2011  . BREAST EXCISIONAL BIOPSY     benign  . BREAST EXCISIONAL BIOPSY Right 1990   benign  . INDUCED ABORTION  2005  . VAGINAL HYSTERECTOMY  1983   partial; for abnormal  bleeding   Family History  Problem Relation Age of Onset  . Hypertension Mother   . Heart disease Mother   . Diabetes Mother   . Kidney disease Mother   . Stroke Father   . Diabetes Father   . Heart disease Father   . Hyperlipidemia Father   . Hypertension Father   . Kidney disease Father   . Cancer Sister        breast  . Diabetes Sister   . Kidney disease Sister   . Hypertension Sister   . Breast cancer Sister   . Mental illness Brother   . Hypertension Maternal Aunt   . Hypertension Maternal Uncle   . Stomach cancer Maternal Uncle   . Hypertension Paternal Aunt   . Kidney disease Paternal Aunt   .  Hypertension Paternal Uncle   . Kidney disease Paternal Uncle   . Breast cancer Sister   . Colon cancer Neg Hx   . Esophageal cancer Neg Hx   . Pancreatic cancer Neg Hx    Social History   Tobacco Use  . Smoking status: Former Smoker    Packs/day: 0.33    Years: 43.00    Pack years: 14.19    Types: Cigarettes    Quit date: 01/06/2013    Years since quitting: 7.6  . Smokeless tobacco: Never Used  Vaping Use  . Vaping Use: Never used  Substance Use Topics  . Alcohol use: No    Alcohol/week: 0.0 standard drinks  . Drug use: Not Currently    Comment: x 26 yrs.   Current Outpatient Medications  Medication Sig Dispense Refill  . albuterol (VENTOLIN HFA) 108 (90 Base) MCG/ACT inhaler Inhale 2 puffs into the lungs every 6 (six) hours as needed for wheezing or shortness of breath. 8 g 3  . Artificial Tear Ointment (DRY EYES OP) Apply 1 drop to eye daily as needed (dry eyes).    . Ascorbic Acid (VITAMIN C) 100 MG tablet Take 50 mg by mouth daily.    . B Complex-C (B-COMPLEX WITH VITAMIN C) tablet Take 1 tablet by mouth daily.    . cetirizine (ZYRTEC) 10 MG tablet Take 10 mg by mouth daily.    . Cholecalciferol (VITAMIN D) 125 MCG (5000 UT) CAPS Take 1-2 tablets by mouth daily.    . Fluticasone-Umeclidin-Vilant (TRELEGY ELLIPTA) 200-62.5-25 MCG/INH AEPB Inhale 1 puff into the lungs daily. 60 each 0  . mometasone (NASONEX) 50 MCG/ACT nasal spray PLACE 2 SPRAYS IN EACH NOSTRIL ONCE DAILY AS NEEDED. 17 g 6  . OVER THE COUNTER MEDICATION MICROTEX VM - FOOD NUTRIENT COMPLEX  2 DAY    . OVER THE COUNTER MEDICATION EO MEGA Essential Oil - 2 daily    . Peppermint Oil (IBGARD) 90 MG CPCR Take 1 capsule by mouth 2 (two) times daily as needed. Take as directed as needed    . polyethylene glycol (MIRALAX) 17 g packet Take 17 g by mouth daily. 14 each 0  . Probiotic Product (PROBIOTIC DAILY PO) Take by mouth.    . simethicone (GAS-X) 80 MG chewable tablet Use as directed as needed 30 tablet 0   No  current facility-administered medications for this visit.   Allergies  Allergen Reactions  . Penicillins Other (See Comments)    REACTION: rash  . Levsin [Hyoscyamine] Anxiety     Review of Systems: All systems reviewed and negative except where noted in HPI.   Lab Results  Component Value Date   WBC  4.0 09/15/2020   HGB 13.2 09/15/2020   HCT 39.7 09/15/2020   MCV 82.4 09/15/2020   PLT 246.0 09/15/2020    Lab Results  Component Value Date   CREATININE 0.92 09/15/2020   BUN 11 09/15/2020   NA 139 09/15/2020   K 4.3 09/15/2020   CL 106 09/15/2020   CO2 29 09/15/2020    ' Lab Results  Component Value Date   ALT 21 09/15/2020   AST 23 09/15/2020   ALKPHOS 66 09/15/2020   BILITOT 0.6 09/15/2020     Physical Exam: BP 118/70   Pulse 75   Ht 5' 3.5" (1.613 m)   Wt 157 lb (71.2 kg)   BMI 27.38 kg/m  Constitutional: Pleasant,well-developed, female in no acute distress. HEENT: Normocephalic and atraumatic. Conjunctivae are normal. No scleral icterus. Neck supple.  Cardiovascular: Normal rate, regular rhythm.  Pulmonary/chest: Effort normal and breath sounds normal. No wheezing, rales or rhonchi. Abdominal: Soft, nondistended, mild epigastric TTP. Marland Kitchen There are no masses palpable. Extremities: no edema Lymphadenopathy: No cervical adenopathy noted. Neurological: Alert and oriented to person place and time. Skin: Skin is warm and dry. No rashes noted. Psychiatric: Normal mood and affect. Behavior is normal.   ASSESSMENT AND PLAN: 64 year old female here for reassessment to follow:  Epigastric discomfort Nausea without vomiting IBS / bloating  She has had an extensive evaluation historically with chronic lower tract symptoms and bloating that are most likely related to underlying IBS.  She has been using MiraLAX at baseline to keep stools regular and IBgard as well which is provided some benefit and will continue that for now.  More acutely she has had some upper  abdominal discomfort with nausea, some chills recently as well.  She recently had a Covid test and that result is pending, she will await that result and I asked her to please let our clinic know if that is positive (we were not aware that test was pending when she checked in).  Otherwise discussed her symptoms with her, has some reflux bothering her.  No NSAIDs.  We will get CBC, c-Met, lipase to ensure normal and provide reassurance.  Otherwise we will give her some Zofran to use every 6 hours as needed, and also recommend empirically starting her on omeprazole 40 mg once daily as well as Carafate to use as needed every 6 hours.  Hopefully her stomach settles down and this will provide some benefit for her over the weekend.  If symptoms persist through the weekend I asked her to contact me, can consider EGD if no improvement as well as right upper quadrant ultrasound to make sure no gallstones given her prandial component.  She agreed with the plan as outlined, all questions answered  [place] Cellar, MD [facility]

## 2020-09-19 ENCOUNTER — Telehealth: Payer: Self-pay | Admitting: Gastroenterology

## 2020-09-19 NOTE — Telephone Encounter (Signed)
Spoke with patient, she is wanting to know why she can't take probiotics. Advised patient that if the probiotic is helping her she may resume it. Patient is wanting to know if you thinks she may have a "leaky gut". Reviewed medications that patient should be taking for her reflux and nausea - pt states that she had not been taking the Omeprazole daily but will start. Pt states that she is still having abdominal discomfort in the mornings even though she take Miralax to keep her bowels regular. Please advise, thank you.

## 2020-09-19 NOTE — Telephone Encounter (Signed)
Pt is requesting a call back from a nurse to know why she is  Not allowed to take pro biotics

## 2020-09-20 NOTE — Telephone Encounter (Signed)
Spoke with patient in regards to Dr. Lanetta Inch recommendations. Patient still concerned that she may have a leaky gut reiterated that Dr. Adela Lank thinks its likely IBS. Discussed that her diet could also effect her symptoms as well. Advised patient to take the medications for a few weeks and to call us back if she does not have any relief. Patient verbalized understanding and had no concerns at the end of the call.

## 2020-09-20 NOTE — Telephone Encounter (Signed)
I had a lengthy visit with this patient recently about her symptoms. I think she more than likely has a functional bowel disorder, likely IBS. She had told me she did not think probiotics helped that much and if not, I would not take it in this setting, sometimes they can make bloating worse. If, however, she really thinks the probiotics help her she can take them, but it is not something I routinely recommend doing for these symptoms. We had discussed using Zofran to use every 6 hours as needed, and also recommended empirically starting her on omeprazole 40 mg once daily as well as Carafate to use as needed every 6 hours. I would like her to try that for a few weeks and she can update [place] at that time. Thanks

## 2020-09-20 NOTE — Telephone Encounter (Signed)
Called patient twice, vm is full, unable to leave a voicemail.

## 2020-09-20 NOTE — Telephone Encounter (Signed)
Inbound call from patient returning your call. 

## 2020-09-22 ENCOUNTER — Other Ambulatory Visit: Payer: Self-pay | Admitting: Student

## 2020-09-22 DIAGNOSIS — J4540 Moderate persistent asthma, uncomplicated: Secondary | ICD-10-CM

## 2020-09-22 MED ORDER — TRELEGY ELLIPTA 200-62.5-25 MCG/INH IN AEPB: 1.0000 | INHALATION_SPRAY | Freq: Every day | RESPIRATORY_TRACT | 0 refills | Status: DC

## 2020-09-22 NOTE — Telephone Encounter (Signed)
Refill Request- Pt states she will be completely out of tomorrow.  Fluticasone-Umeclidin-Vilant (TRELEGY ELLIPTA) 200-62.5-25 MCG/INH AEPB  [id number] - [place], [place] - [address]

## 2020-09-23 ENCOUNTER — Ambulatory Visit: Payer: Medicare Other | Attending: Internal Medicine

## 2020-09-23 DIAGNOSIS — Z23 Encounter for immunization: Secondary | ICD-10-CM

## 2020-09-23 NOTE — Progress Notes (Signed)
   Covid-19 Vaccination Clinic  Name:  ADALYND DONAHOE    MRN: [id number] DOB: 19-Aug-1956  09/23/2020  Ms. Matto was observed post Covid-19 immunization for 15 minutes without incident. She was provided with Vaccine Information Sheet and instruction to access the V-Safe system.   Ms. Dingee was instructed to call 911 with any severe reactions post vaccine: Marland Kitchen Difficulty breathing  . Swelling of face and throat  . A fast heartbeat  . A bad rash all over body  . Dizziness and weakness   Immunizations Administered    Name Date Dose VIS Date Route   Pfizer COVID-19 Vaccine 09/23/2020  2:40 PM 0.3 mL 07/06/2020 Intramuscular   Manufacturer: [place]   Lot: [id number]   NDC: [address]-2

## 2020-10-24 ENCOUNTER — Other Ambulatory Visit: Payer: Self-pay | Admitting: Student

## 2020-10-24 DIAGNOSIS — J4540 Moderate persistent asthma, uncomplicated: Secondary | ICD-10-CM

## 2020-11-03 DIAGNOSIS — L811 Chloasma: Secondary | ICD-10-CM | POA: Diagnosis not present

## 2020-11-07 ENCOUNTER — Ambulatory Visit (INDEPENDENT_AMBULATORY_CARE_PROVIDER_SITE_OTHER): Payer: Medicare Other | Admitting: Student

## 2020-11-07 ENCOUNTER — Encounter: Payer: Self-pay | Admitting: Student

## 2020-11-07 ENCOUNTER — Ambulatory Visit (HOSPITAL_COMMUNITY)
Admission: RE | Admit: 2020-11-07 | Discharge: 2020-11-07 | Disposition: A | Discharge status: Home / Self Care | Payer: Medicare Other | Source: Ambulatory Visit | Attending: Internal Medicine | Admitting: Internal Medicine

## 2020-11-07 VITALS — BP 109/64 | HR 74 | Temp 98.1°F | Wt 159.5 lb

## 2020-11-07 DIAGNOSIS — I208 Other forms of angina pectoris: Secondary | ICD-10-CM

## 2020-11-07 DIAGNOSIS — R0789 Other chest pain: Secondary | ICD-10-CM | POA: Insufficient documentation

## 2020-11-07 DIAGNOSIS — R5382 Chronic fatigue, unspecified: Secondary | ICD-10-CM | POA: Diagnosis not present

## 2020-11-07 DIAGNOSIS — E538 Deficiency of other specified B group vitamins: Secondary | ICD-10-CM | POA: Diagnosis not present

## 2020-11-07 DIAGNOSIS — J4540 Moderate persistent asthma, uncomplicated: Secondary | ICD-10-CM

## 2020-11-07 MED ORDER — TRELEGY ELLIPTA 200-62.5-25 MCG/INH IN AEPB: 1.0000 | INHALATION_SPRAY | Freq: Every day | RESPIRATORY_TRACT | 0 refills | Status: DC

## 2020-11-07 NOTE — Assessment & Plan Note (Signed)
Patient endorses substernal chest pressure with intense exertion. She states that this pressure will come on if she needs to exert herself long distances and is associated with her shortness of breath. She describes the sensation as a "grabbing and pulling sensation." She denies associated nausea, vomiting, diaphoresis, but endorses occasional lightheadedness especially if walking up stairs. Patient's symptoms concerning for stable angina and she would benefit from consideration of stress test. -Referral to cardiology -EKG obtained in clinic unremarkable

## 2020-11-07 NOTE — Assessment & Plan Note (Signed)
Patient has history of moderate persistent asthma for which she has followed closely with our clinic. PFTs revealed findings concerning for restrictive and obstructive disease. Patient has been adherent to Trelegy 1 puff daily since her last office visit. She reports that she continues to have significant shortness of breath with exertion which limits her ability to ambulate from her house to her mailbox or from the parking lot to the clinic. She states that she needs to stop to rest to catch her breath. On physical examination, patient's pulmonary exam is unremarkable and her vitals are stable. Overall, the etiology of patient's dyspnea on exertion is unclear at this time. She does have known history of moderate persistent asthma with additional concern of associated COPD which may be contributory, however escalation of her medication regimen has failed to provide significant relief. Of note, patient's symptoms worsened significantly following COVID-19 infection raising concern of possible ILD associated with prior COVID-19 infection. Patient may benefit from referral to pulmonology or obtaining additional imaging of the chest if symptoms persist or fail to improve following workup discussed in "Chest pressure" -Refill Trelegy -See problem Chest Pressure

## 2020-11-07 NOTE — Patient Instructions (Addendum)
Aimee Davis,  It was a pleasure seeing you in clinic today.  For your shortness of breath: I have refilled your trelegy. Please continue to use this medication daily.  For your chest pressure with exertion: We have completed an EKG in clinic today and I have placed a referral for cardiology.  For your fatigue: We will collect a TSH, vitamin B12 and CBC to look into causes for your symptoms.  Sincerely, Dr. Paulla Dolly, MD

## 2020-11-07 NOTE — Assessment & Plan Note (Signed)
Patient continues to endorse fatigue which she states has been present for several years. Patient's fatigue significantly worsened following COVID-19 infection in June 2020. Patient has had significant laboratory workup for this condiition which has been unrevealing of a specific etiology of her condition. TSH, vitamin B12, CBC no Diff, iron studies, 25OH vitamin D, PHQ9 all have been unremarkable. As patient has had dietary changes since her last office visit including the removal of all meat products (except for fish) and starting iron supplementation, patient would benefit from repeat workup of this condition. -TSH -Vitamin B12 -CBC no Diff

## 2020-11-07 NOTE — Progress Notes (Signed)
   CC: Follow-up chronic fatigue and shortness of breath  HPI:  Ms.Linah C Rabold is a 65 y.o. female with past medical history significant for chronic fatigue and moderate, persistent asthma who presents to clinic for evaluation of fatigue and shortness of breath. Refer to problem list for charting of this encounter.  Past Medical History:  Diagnosis Date  . Allergy    seasonal, PCN, antidepressants, bentyl  . Arthritis   . Asthma   . Carpal tunnel syndrome on both sides    right worse than left 2008  . Cataract 06/11/2013  . COPD (chronic obstructive pulmonary disease) ([place])   . COVID-19 virus infection 06/18/2016  . Eczema 05/02/2018  . GERD (gastroesophageal reflux disease)   . Hyperlipidemia    LDL 108 JULY 2013  . IBS (irritable bowel syndrome)   . Multiple nevi 03/01/2015  . Pneumonia   . Positive TB test    From [facility] 2009 note : Recent F/u at [facility]. CXRAy negative.  Positive TST in 2007 (35mm).  Unable to tolerate INH due to hepatotoxicity   Review of Systems:  Endorses fatigue, shortness of breath with exertion, chest pressure with exertion, lightheadedness. Denies difficulty falling or staying asleep, cough, fevers, chills, weight loss, abdominal pain, nausea, vomiting.  Physical Exam:  Vitals:   11/07/20 1421  BP: 109/64  Pulse: 74  Temp: 98.1 F (36.7 C)  TempSrc: Oral  SpO2: 99%  Weight: 159 lb 8 oz (72.3 kg)   Physical Exam Vitals reviewed.  Constitutional:      General: She is not in acute distress.    Appearance: She is well-developed. She is not ill-appearing.  Pulmonary:     Effort: Pulmonary effort is normal. No respiratory distress.     Breath sounds: Normal breath sounds. No stridor. No wheezing or rales.  Abdominal:     General: Bowel sounds are normal.     Palpations: Abdomen is soft.     Tenderness: There is no abdominal tenderness.  Musculoskeletal:     Right lower leg: No edema.     Left lower leg: No edema.  Skin:    General: Skin is  warm and dry.  Neurological:     General: No focal deficit present.     Mental Status: She is alert.  Psychiatric:        Mood and Affect: Mood normal.        Behavior: Behavior normal.      Assessment & Plan:   See Encounters Tab for problem based charting.  Patient discussed with Dr. Dareen Piano

## 2020-11-08 LAB — CBC
Hematocrit: 41 % (ref 34.0–46.6)
Hemoglobin: 13.3 g/dL (ref 11.1–15.9)
MCH: 26.9 pg (ref 26.6–33.0)
MCHC: 32.4 g/dL (ref 31.5–35.7)
MCV: 83 fL (ref 79–97)
Platelets: 290 10*3/uL (ref 150–450)
RBC: 4.95 x10E6/uL (ref 3.77–5.28)
RDW: 13.3 % (ref 11.7–15.4)
WBC: 3.5 10*3/uL (ref 3.4–10.8)

## 2020-11-08 LAB — BMP8+ANION GAP
Anion Gap: 16 mmol/L (ref 10.0–18.0)
BUN/Creatinine Ratio: 22 (ref 12–28)
BUN: 17 mg/dL (ref 8–27)
CO2: 22 mmol/L (ref 20–29)
Calcium: 10.4 mg/dL — ABNORMAL HIGH (ref 8.7–10.3)
Chloride: 103 mmol/L (ref 96–106)
Creatinine, Ser: 0.79 mg/dL (ref 0.57–1.00)
GFR calc Af Amer: 90 mL/min/{1.73_m2} (ref >59)
GFR calc non Af Amer: 78 mL/min/{1.73_m2} (ref >59)
Glucose: 79 mg/dL (ref 65–99)
Potassium: 5 mmol/L (ref 3.5–5.2)
Sodium: 141 mmol/L (ref 134–144)

## 2020-11-08 LAB — TSH: TSH: 1.84 u[IU]/mL (ref 0.450–4.500)

## 2020-11-08 LAB — VITAMIN B12: Vitamin B-12: 785 pg/mL (ref 232–1245)

## 2020-11-10 NOTE — Progress Notes (Signed)
Internal Medicine Clinic Attending  Case discussed with Dr. Johnson  At the time of the visit.  We reviewed the resident's history and exam and pertinent patient test results.  I agree with the assessment, diagnosis, and plan of care documented in the resident's note.  

## 2020-11-21 NOTE — Progress Notes (Signed)
Cardiology Office Note:    Date:  11/24/2020   ID:  Aimee Davis, DOB November 26, 1955, MRN [id number]  PCP:  Lacinda Axon, MD   [place]  Cardiologist:  No primary care provider on file.  Advanced Practice Provider:  No care team member to display Electrophysiologist:  None   Referring MD: Aldine Contes, MD    History of Present Illness:    Aimee Davis is a 65 y.o. female with a hx of chronic fatigue, GERD and asthma who was referred by Dr. Dareen Piano for further evaluation of dyspnea on exertion and exertional chest pain.  Patient seen by Dr. Dareen Piano on 11/07/20. Note reviewed. She has been having chest pressure with exertion with associated SOB. Specifically, she states that when she walks stairs or tries to walk far, she develops shortness of breath and chest discomfort. Symptoms occurred after having COVID in 02/2019 and seem to have worsened over that time period. Due to her asthma, initially they tried adjusting her inhale, but there has been no improvement. Even when she uses her inhaler when she feels short winded with exertion, it does not seem to help. Has occasional LE edema and palpitations. No nausea, vomiting, lightheadedness, dizziness, or syncope. No bleeding issues. Prior smoker but quit 11 years ago.  Family History: Father with CAD with 2 Mis; sister with CAD, ESRD (age 61), mother HTN, sister HTN  Past Medical History:  Diagnosis Date  . Allergy    seasonal, PCN, antidepressants, bentyl  . Arthritis   . Asthma   . Carpal tunnel syndrome on both sides    right worse than left 2008  . Cataract 06/11/2013  . COPD (chronic obstructive pulmonary disease) ([place])   . COVID-19 virus infection 06/18/2016  . Eczema 05/02/2018  . GERD (gastroesophageal reflux disease)   . Hyperlipidemia    LDL 108 JULY 2013  . IBS (irritable bowel syndrome)   . Multiple nevi 03/01/2015  . Pneumonia   . Positive TB test    From MArch 2009 note :  Recent F/u at [facility]. CXRAy negative.  Positive TST in 2007 (42mm).  Unable to tolerate INH due to hepatotoxicity    Past Surgical History:  Procedure Laterality Date  . BREAST CYST EXCISION Right 2011  . BREAST EXCISIONAL BIOPSY     benign  . BREAST EXCISIONAL BIOPSY Right 1990   benign  . INDUCED ABORTION  2005  . VAGINAL HYSTERECTOMY  1983   partial; for abnormal bleeding    Current Medications: Current Meds  Medication Sig  . albuterol (VENTOLIN HFA) 108 (90 Base) MCG/ACT inhaler Inhale 2 puffs into the lungs every 6 (six) hours as needed for wheezing or shortness of breath.  . Artificial Tear Ointment (DRY EYES OP) Apply 1 drop to eye daily as needed (dry eyes).  . Ascorbic Acid (VITAMIN C) 100 MG tablet Take 50 mg by mouth daily.  . B Complex-C (B-COMPLEX WITH VITAMIN C) tablet Take 1 tablet by mouth daily.  . cetirizine (ZYRTEC) 10 MG tablet Take 10 mg by mouth daily.  . Cholecalciferol (VITAMIN D) 125 MCG (5000 UT) CAPS Take 1-2 tablets by mouth daily.  . Fluticasone-Umeclidin-Vilant (TRELEGY ELLIPTA) 200-62.5-25 MCG/INH AEPB Inhale 1 puff into the lungs daily.  . metoprolol tartrate (LOPRESSOR) 25 MG tablet Patient to take 1 tablet by mouth 2 hours prior to procedure  . mometasone (NASONEX) 50 MCG/ACT nasal spray PLACE 2 SPRAYS IN EACH NOSTRIL ONCE DAILY AS NEEDED.  Marland Kitchen omeprazole (  PRILOSEC) 40 MG capsule Take 1 capsule (40 mg total) by mouth daily.  . ondansetron (ZOFRAN ODT) 4 MG disintegrating tablet Take 1 tablet (4 mg total) by mouth every 8 (eight) hours as needed for nausea or vomiting.  Marland Kitchen OVER THE COUNTER MEDICATION MICROTEX VM - FOOD NUTRIENT COMPLEX  2 DAY  . OVER THE COUNTER MEDICATION EO MEGA Essential Oil - 2 daily  . Peppermint Oil (IBGARD) 90 MG CPCR Take 1 capsule by mouth 2 (two) times daily as needed. Take as directed as needed  . polyethylene glycol (MIRALAX) 17 g packet Take 17 g by mouth daily.  . Probiotic Product (PROBIOTIC DAILY PO) Take by mouth.  .  simethicone (GAS-X) 80 MG chewable tablet Use as directed as needed  . sucralfate (CARAFATE) 1 g tablet Take 1 tablet (1 g total) by mouth every 6 (six) hours as needed.     Allergies:   Penicillins and Levsin [hyoscyamine]   Social History   Socioeconomic History  . Marital status: Divorced    Spouse name: Not on file  . Number of children: 2  . Years of education: Not on file  . Highest education level: Not on file  Occupational History  . Occupation: retired  Tobacco Use  . Smoking status: Former Smoker    Packs/day: 0.33    Years: 43.00    Pack years: 14.19    Types: Cigarettes    Quit date: 01/06/2013    Years since quitting: 7.8  . Smokeless tobacco: Never Used  Vaping Use  . Vaping Use: Never used  Substance and Sexual Activity  . Alcohol use: No    Alcohol/week: 0.0 standard drinks  . Drug use: Not Currently    Comment: x 26 yrs.  . Sexual activity: Not on file    Comment: S/P PARTIAL HYSTERECTOMY  Other Topics Concern  . Not on file  Social History Narrative  . Not on file   Social Determinants of Health   Financial Resource Strain: Not on file  Food Insecurity: Not on file  Transportation Needs: Not on file  Physical Activity: Not on file  Stress: Not on file  Social Connections: Not on file     Family History: The patient's family history includes Breast cancer in her sister and sister; Cancer in her sister; Diabetes in her father, mother, and sister; Heart disease in her father and mother; Hyperlipidemia in her father; Hypertension in her father, maternal aunt, maternal uncle, mother, paternal aunt, paternal uncle, and sister; Kidney disease in her father, mother, paternal aunt, paternal uncle, and sister; Mental illness in her brother; Stomach cancer in her maternal uncle; Stroke in her father. There is no history of Colon cancer, Esophageal cancer, or Pancreatic cancer.  ROS:   Please see the history of present illness.    Review of Systems   Constitutional: Positive for malaise/fatigue. Negative for chills and fever.  HENT: Negative for hearing loss.   Eyes: Negative for blurred vision and redness.  Respiratory: Positive for shortness of breath.   Cardiovascular: Positive for chest pain. Negative for palpitations, orthopnea, claudication, leg swelling and PND.  Gastrointestinal: Negative for melena, nausea and vomiting.  Genitourinary: Negative for dysuria and flank pain.  Musculoskeletal: Negative for falls and neck pain.  Neurological: Negative for dizziness and loss of consciousness.  Endo/Heme/Allergies: Negative for polydipsia.  Psychiatric/Behavioral: Negative for substance abuse.    EKGs/Labs/Other Studies Reviewed:    The following studies were reviewed today: LE doppler 2016: Summary:   -  No evidence of deep vein thrombosis involving the left lower  extremity.  - No evidence of Baker&'s cyst on the left.  EKG:  EKG is  ordered today.  The ekg ordered today demonstrates NSR with incomplete RBBB, HR 62  Recent Labs: 09/15/2020: ALT 21 11/07/2020: BUN 17; Creatinine, Ser 0.79; Hemoglobin 13.3; Platelets 290; Potassium 5.0; Sodium 141; TSH 1.840  Recent Lipid Panel    Component Value Date/Time   CHOL 209 (H) 06/20/2020 1531   TRIG 155 (H) 06/20/2020 1531   HDL 104 06/20/2020 1531   CHOLHDL 2.0 06/20/2020 1531   CHOLHDL 2.2 04/14/2015 1642   VLDL 52 (H) 04/14/2015 1642   LDLCALC 80 06/20/2020 1531     Risk Assessment/Calculations:       Physical Exam:    VS:  BP 112/72   Pulse 62   Ht 5' 3.5" (1.613 m)   Wt 157 lb (71.2 kg)   SpO2 94%   BMI 27.38 kg/m     Wt Readings from Last 3 Encounters:  11/24/20 157 lb (71.2 kg)  11/07/20 159 lb 8 oz (72.3 kg)  09/15/20 157 lb (71.2 kg)     GEN:  Well nourished, well developed in no acute distress HEENT: Normal NECK: No JVD; No carotid bruits CARDIAC: RRR, no murmurs, rubs, gallops RESPIRATORY:  Clear to auscultation without rales, wheezing or  rhonchi  ABDOMEN: Soft, non-tender, non-distended MUSCULOSKELETAL:  No edema; No deformity  SKIN: Warm and dry NEUROLOGIC:  Alert and oriented x 3 PSYCHIATRIC:  Normal affect   ASSESSMENT:    1. Chest pain of uncertain etiology   2. Precordial pain   3. Dyspnea on exertion   4. Intermittent asthma without complication, unspecified asthma severity    PLAN:    In order of problems listed above:  #Exertional Chest Pressure #DOE: Patient with progressive DOE and associated chest pressure with exertion over the past several months. Initially developed symptoms after she had COVID in 02/2019, but have progressed since that time. Symptoms only occur with exertion and are relieved with rest. Has persisted despite multiple adjustments in inhalers for her asthma. Has strong family history of CAD in her father and sister who developed disease in her 15s. Given symptoms and risk factors, will pursue ischemic work-up. -Check coronary CTA -Check TTE  #Asthma: -Follow-up with PCP as scheduled   Medication Adjustments/Labs and Tests Ordered: Current medicines are reviewed at length with the patient today.  Concerns regarding medicines are outlined above.  Orders Placed This Encounter  Procedures  . CT CARDIAC MORPH/PULM VEIN W/CM&W/O CA SCORE  . CT CORONARY FRACTIONAL FLOW RESERVE DATA PREP  . CT CORONARY FRACTIONAL FLOW RESERVE FLUID ANALYSIS  . Basic metabolic panel  . EKG 12-Lead  . ECHOCARDIOGRAM COMPLETE   Meds ordered this encounter  Medications  . metoprolol tartrate (LOPRESSOR) 25 MG tablet    Sig: Patient to take 1 tablet by mouth 2 hours prior to procedure    Dispense:  1 tablet    Refill:  0    Patient Instructions  Medication Instructions:  Your physician recommends that you continue on your current medications as directed. Please refer to the Current Medication list given to you today. *If you need a refill on your cardiac medications before your next appointment,  please call your pharmacy*   Lab Work: Your physician recommends that you return for lab work BMET prior coronary CTA If you have labs (blood work) drawn today and your tests are completely normal, you  will receive your results only by: Marland Kitchen MyChart Message (if you have MyChart) OR . A paper copy in the mail If you have any lab test that is abnormal or we need to change your treatment, we will call you to review the results.   Testing/Procedures: Your physician has requested that you have an echocardiogram. Echocardiography is a painless test that uses sound waves to create images of your heart. It provides your doctor with information about the size and shape of your heart and how well your heart's chambers and valves are working. This procedure takes approximately one hour. There are no restrictions for this procedure.  Your cardiac CT will be scheduled at the below locations:   [facility] [address] [place], [place] [address] [phone]  If scheduled at [facility], please arrive at the [facility] main entrance (entrance A) of [facility] 30 minutes prior to test start time. Proceed to the [facility] Radiology Department (first floor) to check-in and test prep.  Please follow these instructions carefully (unless otherwise directed):   On the Night Before the Test: . Be sure to Drink plenty of water. . Do not consume any caffeinated/decaffeinated beverages or chocolate 12 hours prior to your test. . Do not take any antihistamines 12 hours prior to your test  On the Day of the Test: . Drink plenty of water until 1 hour prior to the test. . Do not eat any food 4 hours prior to the test. . You may take your regular medications prior to the test.  . Take metoprolol (Lopressor) two hours prior to test. . FEMALES- please wear underwire-free bra if available       After the Test: . Drink plenty of water. . After receiving IV contrast, you may  experience a mild flushed feeling. This is normal. . On occasion, you may experience a mild rash up to 24 hours after the test. This is not dangerous. If this occurs, you can take Benadryl 25 mg and increase your fluid intake. . If you experience trouble breathing, this can be serious. If it is severe call 911 IMMEDIATELY. If it is mild, please call our office.    Once we have confirmed authorization from your insurance company, we will call you to set up a date and time for your test. Based on how quickly your insurance processes prior authorizations requests, please allow up to 4 weeks to be contacted for scheduling your Cardiac CT appointment. Be advised that routine Cardiac CT appointments could be scheduled as many as 8 weeks after your provider has ordered it.  For non-scheduling related questions, please contact the cardiac imaging nurse navigator should you have any questions/concerns: Marchia Bond, Cardiac Imaging Nurse Navigator Gordy Clement, Cardiac Imaging Nurse Navigator [facility] Direct Office Dial: [phone]   For scheduling needs, including cancellations and rescheduling, please call [place], [phone].     Follow-Up: At [facility], you and your health needs are our priority.  As part of our continuing mission to provide you with exceptional heart care, we have created designated Provider Care Teams.  These Care Teams include your primary Cardiologist (physician) and Advanced Practice Providers (APPs -  Physician Assistants and Nurse Practitioners) who all work together to provide you with the care you need, when you need it.  We recommend signing up for the patient portal called "MyChart".  Sign up information is provided on this After Visit Summary.  MyChart is  used to connect with patients for Virtual Visits (Telemedicine).  Patients are able to view lab/test results, encounter notes, upcoming appointments, etc.  Non-urgent messages  can be sent to your provider as well.   To learn more about what you can do with MyChart, go to [email or web address].    Your next appointment:   3 month(s)  The format for your next appointment:   In Person  Provider:   You may see Dr. Gwyndolyn Kaufman or one of the following Advanced Practice Providers on your designated Care Team:    Richardson Dopp, PA-C  Robbie Lis, [place]        Signed, Freada Bergeron, MD  11/24/2020 1:07 PM    [place]

## 2020-11-24 ENCOUNTER — Ambulatory Visit (INDEPENDENT_AMBULATORY_CARE_PROVIDER_SITE_OTHER): Payer: Medicare Other | Admitting: Cardiology

## 2020-11-24 ENCOUNTER — Encounter: Payer: Self-pay | Admitting: Cardiology

## 2020-11-24 ENCOUNTER — Other Ambulatory Visit: Payer: Self-pay

## 2020-11-24 VITALS — BP 112/72 | HR 62 | Ht 63.5 in | Wt 157.0 lb

## 2020-11-24 DIAGNOSIS — R0609 Other forms of dyspnea: Secondary | ICD-10-CM

## 2020-11-24 DIAGNOSIS — R079 Chest pain, unspecified: Secondary | ICD-10-CM | POA: Diagnosis not present

## 2020-11-24 DIAGNOSIS — R0600 Dyspnea, unspecified: Secondary | ICD-10-CM

## 2020-11-24 DIAGNOSIS — J4520 Mild intermittent asthma, uncomplicated: Secondary | ICD-10-CM

## 2020-11-24 DIAGNOSIS — R072 Precordial pain: Secondary | ICD-10-CM | POA: Diagnosis not present

## 2020-11-24 MED ORDER — METOPROLOL TARTRATE 25 MG PO TABS: ORAL_TABLET | ORAL | 0 refills | Status: DC

## 2020-11-24 NOTE — Patient Instructions (Addendum)
Medication Instructions:  Your physician recommends that you continue on your current medications as directed. Please refer to the Current Medication list given to you today. *If you need a refill on your cardiac medications before your next appointment, please call your pharmacy*   Lab Work: Your physician recommends that you return for lab work BMET prior coronary CTA If you have labs (blood work) drawn today and your tests are completely normal, you will receive your results only by: Marland Kitchen MyChart Message (if you have MyChart) OR . A paper copy in the mail If you have any lab test that is abnormal or we need to change your treatment, we will call you to review the results.   Testing/Procedures: Your physician has requested that you have an echocardiogram. Echocardiography is a painless test that uses sound waves to create images of your heart. It provides your doctor with information about the size and shape of your heart and how well your heart's chambers and valves are working. This procedure takes approximately one hour. There are no restrictions for this procedure.  Your cardiac CT will be scheduled at the below locations:   [facility] [address] [place], [place] [address] [phone]  If scheduled at [facility], please arrive at the [facility] main entrance (entrance A) of [facility] 30 minutes prior to test start time. Proceed to the [facility] Radiology Department (first floor) to check-in and test prep.  Please follow these instructions carefully (unless otherwise directed):   On the Night Before the Test: . Be sure to Drink plenty of water. . Do not consume any caffeinated/decaffeinated beverages or chocolate 12 hours prior to your test. . Do not take any antihistamines 12 hours prior to your test  On the Day of the Test: . Drink plenty of water until 1 hour prior to the test. . Do not eat any food 4 hours prior to the test. . You  may take your regular medications prior to the test.  . Take metoprolol (Lopressor) two hours prior to test. . FEMALES- please wear underwire-free bra if available       After the Test: . Drink plenty of water. . After receiving IV contrast, you may experience a mild flushed feeling. This is normal. . On occasion, you may experience a mild rash up to 24 hours after the test. This is not dangerous. If this occurs, you can take Benadryl 25 mg and increase your fluid intake. . If you experience trouble breathing, this can be serious. If it is severe call 911 IMMEDIATELY. If it is mild, please call our office.    Once we have confirmed authorization from your insurance company, we will call you to set up a date and time for your test. Based on how quickly your insurance processes prior authorizations requests, please allow up to 4 weeks to be contacted for scheduling your Cardiac CT appointment. Be advised that routine Cardiac CT appointments could be scheduled as many as 8 weeks after your provider has ordered it.  For non-scheduling related questions, please contact the cardiac imaging nurse navigator should you have any questions/concerns: Marchia Bond, Cardiac Imaging Nurse Navigator Gordy Clement, Cardiac Imaging Nurse Navigator [place] [facility] Direct Office Dial: [phone]   For scheduling needs, including cancellations and rescheduling, please call [place], [phone].     Follow-Up: At [facility], you and your health needs are our priority.  As part of our continuing mission to  provide you with exceptional heart care, we have created designated Provider Care Teams.  These Care Teams include your primary Cardiologist (physician) and Advanced Practice Providers (APPs -  Physician Assistants and Nurse Practitioners) who all work together to provide you with the care you need, when you need it.  We recommend signing up for the patient portal called  "MyChart".  Sign up information is provided on this After Visit Summary.  MyChart is used to connect with patients for Virtual Visits (Telemedicine).  Patients are able to view lab/test results, encounter notes, upcoming appointments, etc.  Non-urgent messages can be sent to your provider as well.   To learn more about what you can do with MyChart, go to [email or web address].    Your next appointment:   3 month(s)  The format for your next appointment:   In Person  Provider:   You may see Dr. Gwyndolyn Kaufman or one of the following Advanced Practice Providers on your designated Care Team:    Richardson Dopp, PA-C  [place], [place]

## 2020-11-25 ENCOUNTER — Other Ambulatory Visit: Payer: Self-pay

## 2020-11-28 ENCOUNTER — Other Ambulatory Visit: Payer: Self-pay | Admitting: *Deleted

## 2020-11-28 ENCOUNTER — Other Ambulatory Visit: Payer: Self-pay

## 2020-11-28 DIAGNOSIS — J4540 Moderate persistent asthma, uncomplicated: Secondary | ICD-10-CM

## 2020-11-28 MED ORDER — TRELEGY ELLIPTA 200-62.5-25 MCG/INH IN AEPB: 1.0000 | INHALATION_SPRAY | Freq: Every day | RESPIRATORY_TRACT | 2 refills | Status: DC

## 2020-12-02 ENCOUNTER — Telehealth: Payer: Self-pay | Admitting: *Deleted

## 2020-12-02 DIAGNOSIS — R072 Precordial pain: Secondary | ICD-10-CM

## 2020-12-02 NOTE — Telephone Encounter (Signed)
Order for Coronary CT placed. Will send Marchia Bond CT Nurse Navigator message CT is scheduled.

## 2020-12-02 NOTE — Telephone Encounter (Signed)
-----   Message from Freada Bergeron, MD sent at 12/02/2020  5:38 PM EDT -----  ----- Message ----- From: Lorenza Evangelist, RN Sent: 12/02/2020   9:50 AM EDT To: Ciro Backer, Melony Overly, #  Hey Team,   This order is incorrect for the indication/reason for test. From the office notes, she should have a coronary morph CTA ordered and not a pulm vein CTA. Please update this and let us know when its fixed and we will be happy to schedule it  Thank you, Marchia Bond RN [facility] [phone] Office  [phone] Cell

## 2020-12-06 ENCOUNTER — Telehealth (HOSPITAL_COMMUNITY): Payer: Self-pay | Admitting: Emergency Medicine

## 2020-12-06 NOTE — Telephone Encounter (Signed)
Attempted to call patient regarding upcoming cardiac CT appointment. °Left message on voicemail with name and callback number °Kentrel Clevenger RN Navigator Cardiac Imaging °[facility] °[phone] Office °[phone] Cell ° °

## 2020-12-06 NOTE — Telephone Encounter (Signed)
Attempted to call patient regarding upcoming cardiac CT appointment. °Left message on voicemail with name and callback number °Valdez Brannan RN Navigator Cardiac Imaging °[facility] °[phone] Office °[phone] Cell ° °

## 2020-12-07 ENCOUNTER — Ambulatory Visit (HOSPITAL_BASED_OUTPATIENT_CLINIC_OR_DEPARTMENT_OTHER)
Admission: RE | Admit: 2020-12-07 | Discharge: 2020-12-07 | Disposition: A | Discharge status: Home / Self Care | Payer: Medicare Other | Source: Ambulatory Visit | Attending: Cardiology | Admitting: Cardiology

## 2020-12-07 ENCOUNTER — Other Ambulatory Visit: Payer: Self-pay

## 2020-12-07 DIAGNOSIS — I700 Atherosclerosis of aorta: Secondary | ICD-10-CM | POA: Insufficient documentation

## 2020-12-07 DIAGNOSIS — R072 Precordial pain: Secondary | ICD-10-CM | POA: Diagnosis not present

## 2020-12-07 LAB — POCT I-STAT CREATININE: Creatinine, Ser: 0.9 mg/dL (ref 0.44–1.00)

## 2020-12-07 MED ORDER — IOHEXOL 350 MG/ML SOLN
90.0000 mL | Freq: Once | INTRAVENOUS | Status: AC | PRN
  Administered 2020-12-07: 90 mL via INTRAVENOUS
  Filled 2020-12-07: qty 90

## 2020-12-15 ENCOUNTER — Telehealth: Payer: Self-pay | Admitting: *Deleted

## 2020-12-15 DIAGNOSIS — E785 Hyperlipidemia, unspecified: Secondary | ICD-10-CM

## 2020-12-15 DIAGNOSIS — Z79899 Other long term (current) drug therapy: Secondary | ICD-10-CM

## 2020-12-15 MED ORDER — ROSUVASTATIN CALCIUM 10 MG PO TABS: 10.0000 mg | ORAL_TABLET | Freq: Every day | ORAL | 1 refills | Status: DC

## 2020-12-15 NOTE — Telephone Encounter (Signed)
-----   Message from Freada Bergeron, MD sent at 12/08/2020  7:17 PM EDT ----- Her CT scan does not show any significant blockages. She does have mild plaque. Can we start her on crestor 10mg  daily to help prevent the plaque from progressing. Repeat lipids with LFTs in 6 weeks.

## 2020-12-15 NOTE — Telephone Encounter (Signed)
Endorsed to the pt her Cardiac CT results and recommendations per Dr. Johney Frame, for her to start taking rosuvastatin 10 mg po daily, and come in for repeat lipids and LFTs in 6 weeks.  Confirmed the pharmacy of choice with the pt. Scheduled the pt to come in for repeat labs in 6 weeks on 01/26/21.  She is aware to come fasting.  Pt verbalized understanding and agrees with this plan.

## 2020-12-16 ENCOUNTER — Telehealth: Payer: Self-pay

## 2020-12-16 ENCOUNTER — Encounter: Payer: Medicare Other | Admitting: Student

## 2020-12-16 NOTE — Telephone Encounter (Signed)
RTC, pt c/o upper-mid back pain X 2 weeks which she describes as sharp and with movement.  [facility] Appt made for 12/20/20 [id number] w/ Dr. Marianna Payment. SChaplin, RN,BSN

## 2020-12-16 NOTE — Telephone Encounter (Signed)
pls contact [phone]

## 2020-12-20 ENCOUNTER — Ambulatory Visit (INDEPENDENT_AMBULATORY_CARE_PROVIDER_SITE_OTHER): Payer: Medicare Other | Admitting: Internal Medicine

## 2020-12-20 ENCOUNTER — Other Ambulatory Visit: Payer: Self-pay

## 2020-12-20 ENCOUNTER — Ambulatory Visit (HOSPITAL_COMMUNITY): Payer: Medicare Other | Attending: Cardiology

## 2020-12-20 VITALS — BP 132/72 | HR 72 | Temp 98.2°F | Ht 63.5 in | Wt 157.7 lb

## 2020-12-20 DIAGNOSIS — M62838 Other muscle spasm: Secondary | ICD-10-CM | POA: Diagnosis not present

## 2020-12-20 DIAGNOSIS — R079 Chest pain, unspecified: Secondary | ICD-10-CM

## 2020-12-20 DIAGNOSIS — M62830 Muscle spasm of back: Secondary | ICD-10-CM | POA: Diagnosis not present

## 2020-12-20 DIAGNOSIS — K589 Irritable bowel syndrome without diarrhea: Secondary | ICD-10-CM | POA: Diagnosis not present

## 2020-12-20 LAB — ECHOCARDIOGRAM COMPLETE
Area-P 1/2: 3.66 cm2
S' Lateral: 2.7 cm

## 2020-12-20 MED ORDER — DICLOFENAC SODIUM 1 % EX GEL: 4.0000 g | Freq: Four times a day (QID) | CUTANEOUS | 0 refills | Status: DC

## 2020-12-20 NOTE — Progress Notes (Signed)
CC: Muscle spasm  HPI:  [name] is a 65 y.o. female with a past medical history stated below and presents today for muscle spasm. Please see problem based assessment and plan for additional details.  Past Medical History:  Diagnosis Date  . Allergy    seasonal, PCN, antidepressants, bentyl  . Arthritis   . Asthma   . Carpal tunnel syndrome on both sides    right worse than left 2008  . Cataract 06/11/2013  . COPD (chronic obstructive pulmonary disease) ([place])   . COVID-19 virus infection 06/18/2016  . Eczema 05/02/2018  . GERD (gastroesophageal reflux disease)   . Hyperlipidemia    LDL 108 JULY 2013  . IBS (irritable bowel syndrome)   . Multiple nevi 03/01/2015  . Pneumonia   . Positive TB test    From MArch 2009 note : Recent F/u at [facility]. CXRAy negative.  Positive TST in 2007 (58mm).  Unable to tolerate INH due to hepatotoxicity    Current Outpatient Medications on File Prior to Visit  Medication Sig Dispense Refill  . albuterol (VENTOLIN HFA) 108 (90 Base) MCG/ACT inhaler Inhale 2 puffs into the lungs every 6 (six) hours as needed for wheezing or shortness of breath. 8 g 3  . Artificial Tear Ointment (DRY EYES OP) Apply 1 drop to eye daily as needed (dry eyes).    . Ascorbic Acid (VITAMIN C) 100 MG tablet Take 50 mg by mouth daily.    . B Complex-C (B-COMPLEX WITH VITAMIN C) tablet Take 1 tablet by mouth daily.    . cetirizine (ZYRTEC) 10 MG tablet Take 10 mg by mouth daily.    . Cholecalciferol (VITAMIN D) 125 MCG (5000 UT) CAPS Take 1-2 tablets by mouth daily.    . Fluticasone-Umeclidin-Vilant (TRELEGY ELLIPTA) 200-62.5-25 MCG/INH AEPB Inhale 1 puff into the lungs daily. 60 each 2  . metoprolol tartrate (LOPRESSOR) 25 MG tablet Patient to take 1 tablet by mouth 2 hours prior to procedure 1 tablet 0  . mometasone (NASONEX) 50 MCG/ACT nasal spray PLACE 2 SPRAYS IN EACH NOSTRIL ONCE DAILY AS NEEDED. 17 g 6  . omeprazole (PRILOSEC) 40 MG capsule Take 1 capsule (40 mg  total) by mouth daily. 30 capsule 3  . ondansetron (ZOFRAN ODT) 4 MG disintegrating tablet Take 1 tablet (4 mg total) by mouth every 8 (eight) hours as needed for nausea or vomiting. 20 tablet 1  . OVER THE COUNTER MEDICATION MICROTEX VM - FOOD NUTRIENT COMPLEX  2 DAY    . OVER THE COUNTER MEDICATION EO MEGA Essential Oil - 2 daily    . Peppermint Oil (IBGARD) 90 MG CPCR Take 1 capsule by mouth 2 (two) times daily as needed. Take as directed as needed    . polyethylene glycol (MIRALAX) 17 g packet Take 17 g by mouth daily. 14 each 0  . Probiotic Product (PROBIOTIC DAILY PO) Take by mouth.    . rosuvastatin (CRESTOR) 10 MG tablet Take 1 tablet (10 mg total) by mouth daily. 90 tablet 1  . simethicone (GAS-X) 80 MG chewable tablet Use as directed as needed 30 tablet 0  . sucralfate (CARAFATE) 1 g tablet Take 1 tablet (1 g total) by mouth every 6 (six) hours as needed. 30 tablet 3   No current facility-administered medications on file prior to visit.    Family History  Problem Relation Age of Onset  . Hypertension Mother   . Heart disease Mother   . Diabetes Mother   .  Kidney disease Mother   . Stroke Father   . Diabetes Father   . Heart disease Father   . Hyperlipidemia Father   . Hypertension Father   . Kidney disease Father   . Cancer Sister        breast  . Diabetes Sister   . Kidney disease Sister   . Hypertension Sister   . Breast cancer Sister   . Mental illness Brother   . Hypertension Maternal Aunt   . Hypertension Maternal Uncle   . Stomach cancer Maternal Uncle   . Hypertension Paternal Aunt   . Kidney disease Paternal Aunt   . Hypertension Paternal Uncle   . Kidney disease Paternal Uncle   . Breast cancer Sister   . Colon cancer Neg Hx   . Esophageal cancer Neg Hx   . Pancreatic cancer Neg Hx     Social History   Socioeconomic History  . Marital status: Divorced    Spouse name: Not on file  . Number of children: 2  . Years of education: Not on file  .  Highest education level: Not on file  Occupational History  . Occupation: retired  Tobacco Use  . Smoking status: Former Smoker    Packs/day: 0.33    Years: 43.00    Pack years: 14.19    Types: Cigarettes    Quit date: 01/06/2013    Years since quitting: 7.9  . Smokeless tobacco: Never Used  Vaping Use  . Vaping Use: Never used  Substance and Sexual Activity  . Alcohol use: No    Alcohol/week: 0.0 standard drinks  . Drug use: Not Currently    Comment: x 26 yrs.  . Sexual activity: Not on file    Comment: S/P PARTIAL HYSTERECTOMY  Other Topics Concern  . Not on file  Social History Narrative  . Not on file   Social Determinants of Health   Financial Resource Strain: Not on file  Food Insecurity: Not on file  Transportation Needs: Not on file  Physical Activity: Not on file  Stress: Not on file  Social Connections: Not on file  Intimate Partner Violence: Not on file    Review of Systems: ROS negative except for what is noted on the assessment and plan.  Vitals:   12/20/20 1414  BP: 132/72  Pulse: 72  Temp: 98.2 F (36.8 C)  TempSrc: Oral  SpO2: 99%  Weight: 157 lb 11.2 oz (71.5 kg)  Height: 5' 3.5" (1.613 m)     Physical Exam: Physical Exam Constitutional:      Appearance: Normal appearance.  HENT:     Head: Normocephalic and atraumatic.  Neck:     Comments: Does have some pain in upper back with full ROM of neck Cardiovascular:     Rate and Rhythm: Normal rate and regular rhythm.     Pulses: Normal pulses.  Pulmonary:     Effort: Pulmonary effort is normal.     Breath sounds: Normal breath sounds.  Musculoskeletal:        General: Tenderness (TTP of the rhomboid on the left. The is some robbiness of the muscle with a noticble knot. No erythema or edema) present.     Cervical back: Normal range of motion.  Skin:    General: Skin is warm and dry.  Neurological:     General: No focal deficit present.     Mental Status: She is alert and oriented to  person, place, and time.  Assessment & Plan:   See Encounters Tab for problem based charting.  Patient discussed with Dr. Lars Mage, D.O. [facility], PGY-2 Pager: [phone], Phone: [phone] Date 12/21/2020 Time 11:29 AM

## 2020-12-20 NOTE — Patient Instructions (Addendum)
Thank you, Aimee Davis for allowing [place] to provide your care today. Today we discussed muscle spasm/strain.    I have ordered the following labs for you:  Lab Orders  No laboratory test(s) ordered today     Tests ordered today:  none  Referrals ordered today:   Referral Orders  No referral(s) requested today     Medication Changes:   There are no discontinued medications.   Meds ordered this encounter  Medications  . diclofenac Sodium (VOLTAREN) 1 % GEL    Sig: Apply 4 g topically 4 (four) times daily.    Dispense:  350 g    Refill:  0     Instructions:   1. Please perform gentle stretching of you neck and shoulders 2. Use heating pads in 20 minute intervals 3. Please use Tylenol 650 mg every 4 to 6 hours for pain 4. You can also use Ibuprofen 400 mg every 6 hours  5. Please use voltaren gel up to 4 time daily.   Follow up: 1 week as needed    Should you have any questions or concerns please call the internal medicine clinic at [phone].     Marianna Payment, D.O. [place]     Muscle Cramps and Spasms Muscle cramps and spasms occur when a muscle or muscles tighten and you have no control over this tightening (involuntary muscle contraction). They are a common problem and can develop in any muscle. The most common place is in the calf muscles of the leg. Muscle cramps and muscle spasms are both involuntary muscle contractions, but there are some differences between the two:  Muscle cramps are painful. They come and go and may last for a few seconds or up to 15 minutes. Muscle cramps are often more forceful and last longer than muscle spasms.  Muscle spasms may or may not be painful. They may also last just a few seconds or much longer. Certain medical conditions, such as diabetes or Parkinson's disease, can make it more likely to develop cramps or spasms. However, cramps or spasms are usually not caused by a serious underlying  problem. Common causes include:  Doing more physical work or exercise than your body is ready for (overexertion).  Overuse from repeating certain movements too many times.  Remaining in a certain position for a long period of time.  Improper preparation, form, or technique while playing a sport or doing an activity.  Dehydration.  Injury.  Side effects of some medicines.  Abnormally low levels of the salts and minerals in your blood (electrolytes), especially potassium and calcium. This could happen if you are taking water pills (diuretics) or if you are pregnant. In many cases, the cause of muscle cramps or spasms is not known. Follow these instructions at home: Managing pain and stiffness  Try massaging, stretching, and relaxing the affected muscle. Do this for several minutes at a time.  If directed, apply heat to tight or tense muscles as often as told by your health care provider. Use the heat source that your health care provider recommends, such as a moist heat pack or a heating pad. ? Place a towel between your skin and the heat source. ? Leave the heat on for 20-30 minutes. ? Remove the heat if your skin turns bright red. This is especially important if you are unable to feel pain, heat, or cold. You may have a greater risk of getting burned.  If directed, put ice on the  affected area. This may help if you are sore or have pain after a cramp or spasm. ? Put ice in a plastic bag. ? Place a towel between your skin and the bag. ? Leavethe ice on for 20 minutes, 2-3 times a day.  Try taking hot showers or baths to help relax tight muscles.      Eating and drinking  Drink enough fluid to keep your urine pale yellow. Staying well hydrated may help prevent cramps or spasms.  Eat a healthy diet that includes plenty of nutrients to help your muscles function. A healthy diet includes fruits and vegetables, lean protein, whole grains, and low-fat or nonfat dairy  products. General instructions  If you are having frequent cramps, avoid intense exercise for several days.  Take over-the-counter and prescription medicines only as told by your health care provider.  Pay attention to any changes in your symptoms.  Keep all follow-up visits as told by your health care provider. This is important. Contact a health care provider if:  Your cramps or spasms get more severe or happen more often.  Your cramps or spasms do not improve over time. Summary  Muscle cramps and spasms occur when a muscle or muscles tighten and you have no control over this tightening (involuntary muscle contraction).  The most common place for cramps or spasms to occur is in the calf muscles of the leg.  Massaging, stretching, and relaxing the affected muscle may relieve the cramp or spasm.  Drink enough fluid to keep your urine pale yellow. Staying well hydrated may help prevent cramps or spasms. This information is not intended to replace advice given to you by your health care provider. Make sure you discuss any questions you have with your health care provider. Document Revised: 01/27/2018 Document Reviewed: 01/27/2018 Elsevier Patient Education  [place].

## 2020-12-21 ENCOUNTER — Encounter: Payer: Self-pay | Admitting: Internal Medicine

## 2020-12-21 DIAGNOSIS — M62830 Muscle spasm of back: Secondary | ICD-10-CM | POA: Insufficient documentation

## 2020-12-21 NOTE — Assessment & Plan Note (Signed)
Patient presents with a 3-week history of upper left back pain.  It is located around the rhomboid between the scapula and the thoracic spine.  She denies any triggering events or trauma.  She does admit to increased exercise and cooking lately which could have precipitated this.  Exam patient does have a tight ropey rhomboid muscle on the left with and not that reproduces her pain.  She states that she would like to avoid medication management initially if possible.  I counseled her on light stretching, warm compresses, and over-the-counter medications including Tylenol, ibuprofen, and prescription Voltaren gel.  I also recommended the use of lidocaine patches to assist in analgesia.  Patient would like to try these recommendations prior to starting a muscle relaxer.  Therefore, we will follow up in a week or 2 to reassess her pain.

## 2020-12-23 ENCOUNTER — Telehealth: Payer: Self-pay | Admitting: Gastroenterology

## 2020-12-23 ENCOUNTER — Telehealth: Payer: Self-pay

## 2020-12-23 ENCOUNTER — Ambulatory Visit (INDEPENDENT_AMBULATORY_CARE_PROVIDER_SITE_OTHER): Payer: Medicare Other | Admitting: Internal Medicine

## 2020-12-23 DIAGNOSIS — M62830 Muscle spasm of back: Secondary | ICD-10-CM | POA: Diagnosis not present

## 2020-12-23 MED ORDER — METHOCARBAMOL 500 MG PO TABS: 500.0000 mg | ORAL_TABLET | Freq: Four times a day (QID) | ORAL | 0 refills | Status: AC | PRN

## 2020-12-23 NOTE — Telephone Encounter (Signed)
Okay, thanks for the update!

## 2020-12-23 NOTE — Telephone Encounter (Signed)
Spoke with patient in regards to recommendations. Pt will try Methocarbamol over the weekend and see how she does. Advised that she also restart Omeprazole if it helped. She is aware that if her symptoms persist despite the PPI then next step would be EGD. Advised patient to try it over the weekend and to give [place] a call on Monday if she has no relief and we can get her set up for an EGD with Dr. Havery Moros. Patient verbalized understanding and had no concerns at the end of the call.

## 2020-12-23 NOTE — Progress Notes (Signed)
  [facility] Residency Telephone Encounter Continuity Care Appointment  HPI:   This telephone encounter was created for Aimee. Aimee Davis on 12/23/2020 for the following purpose/cc continued upper back pain.    Past Medical History:  Past Medical History:  Diagnosis Date  . Allergy    seasonal, PCN, antidepressants, bentyl  . Arthritis   . Asthma   . Carpal tunnel syndrome on both sides    right worse than left 2008  . Cataract 06/11/2013  . COPD (chronic obstructive pulmonary disease) ([place])   . COVID-19 virus infection 06/18/2016  . Eczema 05/02/2018  . GERD (gastroesophageal reflux disease)   . Hyperlipidemia    LDL 108 JULY 2013  . IBS (irritable bowel syndrome)   . Multiple nevi 03/01/2015  . Pneumonia   . Positive TB test    From MArch 2009 note : Recent F/u at [facility]. CXRAy negative.  Positive TST in 2007 (19mm).  Unable to tolerate INH due to hepatotoxicity      ROS:   Negative except as stated in HPI.    Assessment / Plan / Recommendations:   Please see A&P under problem oriented charting for assessment of the patient's acute and chronic medical conditions.   As always, pt is advised that if symptoms worsen or new symptoms arise, they should go to an urgent care facility or to to ER for further evaluation.   Consent and Medical Decision Making:   Patient discussed with Dr. Jimmye Norman  This is a telephone encounter between Aimee Davis and [place] on 12/23/2020 for back pain. The visit was conducted with the patient located at home and [place] at [facility]. The patient's identity was confirmed using their DOB and current address. The patient has consented to being evaluated through a telephone encounter and understands the associated risks (an examination cannot be done and the patient may need to come in for an appointment) / benefits (allows the patient to remain at home, decreasing exposure to coronavirus). I personally spent 10 minutes on medical  discussion.    Aimee Davis is calling to discuss continued left sided back pain. She was evaluated for this on 4/6 with several days of back pain. Her physical exam was most consistent with muscle spasms and patient was recommended for Tylenol/Ibuprofen, light stretching, warm compresses and voltaren gel with lidocaine patch. She reports that she has been adhering to the recommended treatment plan with scheduled Tylenol and Ibuprofen, heating pad and has been using Voltaren gel and Lidocaine patch. However, is having persistent left upper back pain. This is mostly around the left scapula area and she denies any radiation. She notes that when the pain is significant, she is unable to lift her left arm. She does note moving some boxes yesterday. I suspect this may be contributing to her ongoing pain.

## 2020-12-23 NOTE — Telephone Encounter (Signed)
Return pt's call. States she is in a lot of pain. She was seen on Tuesday of this week and she said she did and doing what the doctor told her to do .  She tried Tylenol and Ibuprofen; heating pad, voltaren gel and currently has asper creme patch on. She stated nothing is helping; pt sounds like she's almost in tears.  Informed pt telehealth appt schedule for this afternoon with Dr Marva Panda; pt very appreciative.

## 2020-12-23 NOTE — Telephone Encounter (Signed)
Pt is wanting a dr to call her regarding her back pain [phone]

## 2020-12-23 NOTE — Telephone Encounter (Signed)
Sorry to hear this.  Methocarbamol can cause upset stomach / nausea as side effect, so possible it could make her stomach worse, but also quite possible it won't and she will be fine. I recommend she try a dose of it and see how she feels.  Otherwise, if the omeprazole helps her she can resume it, no interaction there. However if her epigastric pain persists despite the PPI then we should consider an EGD. I had discussed this at the last clinic visit with her. If symptoms persist may be reasonable to do EGD if she wants to proceed, can you help with scheduling? Thanks

## 2020-12-23 NOTE — Assessment & Plan Note (Signed)
Ms Aimee Davis is calling to discuss continued left sided back pain. She was evaluated for this on 4/6 with several days of back pain. Her physical exam was most consistent with muscle spasms and patient was recommended for Tylenol/Ibuprofen, light stretching, warm compresses and voltaren gel with lidocaine patch. She reports that she has been adhering to the recommended treatment plan with scheduled Tylenol and Ibuprofen, heating pad and has been using Voltaren gel and Lidocaine patch. However, is having persistent left upper back pain. This is mostly around the left scapula area and she denies any radiation. She notes that when the pain is significant, she is unable to lift her left arm. She does note moving some boxes yesterday. I suspect this may be contributing to her ongoing pain.  At this time, suspect etiology of her pain is still musculoskeletal in nature. Early zoster is a consideration; however, due to the nature of the pain and the recent physical exam findings, muscle spasm is more likely. Discussed trial of muscle relaxer at this time for which patient is agreeable.  Plan: Robaxin 500mg  q6h prn (recommended to take mostly in the evening) Patient advised to avoid heavy lifting/excessive activity for a few days Recommended that if worsening of symptoms or no significant improvement, will require re-evaluation.

## 2020-12-23 NOTE — Telephone Encounter (Signed)
Spoke with patient she states that she was recently prescribed Diclofenac gel and Methocarbamol for muscle spasms that she has been having. Patient is wanting to know if Methocarbamol will cause any stomach upset since she has been having stomach pains most mornings. Pt states that she has not been taking the Omeprazole or sucralfate. She states that she does not feel like she is having any issues with reflux but is still having stomach pain. She has still been taking IB Gard. Patient wanted to know if the Omeprazole would interact with the newly prescribed medications. Advised that it would be best to ask the pharmacist about any medication interactions. Please advise, thanks

## 2020-12-23 NOTE — Telephone Encounter (Signed)
Pt states that her PCP just prescribed Diclofenac 1mg  and Methochrbamol 500mg . Pt wants to know if these meds are gentle to her stomach because she has been experiencing stomach pain so does not want medications to made her stomach pain worse. Pls call her.

## 2020-12-23 NOTE — Progress Notes (Signed)
Internal Medicine Clinic Attending  Case discussed with Dr. Coe  At the time of the visit.  We reviewed the resident's history and exam and pertinent patient test results.  I agree with the assessment, diagnosis, and plan of care documented in the resident's note.  

## 2020-12-24 ENCOUNTER — Other Ambulatory Visit: Payer: Self-pay | Admitting: Internal Medicine

## 2020-12-24 MED ORDER — MELOXICAM 7.5 MG PO TABS: 7.5000 mg | ORAL_TABLET | Freq: Every day | ORAL | 0 refills | Status: AC

## 2020-12-24 NOTE — Progress Notes (Signed)
[facility] 24H EMERGENCY LINE TELEPHONE ENCOUNTER   I received a page from the operator that pt was requesting a call back. She notes that she was recently seen in our clinic by Dr. Marianna Payment on 4/5 for back spasms and was told that she has a muscle strain. She initially wanted to avoid a muscle relaxer, however after trying tylenol, ibuprofen, voltaren gel and lidocaine patches without relief, she called back into the clinic yesterday, 4/8.   She was called back by Dr. Marva Panda at that time and robaxin was sent to the pharmacy.  She says that she tried this last night however it made her feel very "down" until it wore off and she was unable to sleep. She asks for alternative recommendations.  She notes that this left scapular pain has been worsening over the past 3w. It has continued to worsen since originally seeing Dr. Marianna Payment on 4/5.   Assessment: I reviewed Dr. Marianna Payment and Dr. Margy Clarks note from 4/5 and 4/8 respectively. I agree that it sounded consistent with MSK at that time. I explained to her that muscular injuries can take a while to heal.  Since her pain is worsening, rather than improving, I considered biliary causes. It is not associated with eating and she denies abdominal pain or fevers which is reassuring so cholelithiasis or ascending cholangitis are less likely but I do think she should be re-evaluated and potentially have a hepatic function panel drawn, along with a CBC. Of note, she is having thoughts of harming herself.  Plan -mobic 7.5mg  daily. Explained not to take this with ibuprofen -I will also try to reach out to discuss with the provider who initially assessed her on 4/5. -message sent to the triage pool to call on Monday to re-evaluate symptoms. Would recommend having her re-evaluated given the worsening sx. -ED precautions discussed  Mitzi Hansen, MD Internal Medicine Resident PGY-2 [facility] Pager: [phone] 12/24/2020 3:19 PM

## 2020-12-26 ENCOUNTER — Telehealth: Payer: Self-pay | Admitting: *Deleted

## 2020-12-26 NOTE — Progress Notes (Signed)
Internal Medicine Clinic Attending ° °Case discussed with Dr. Aslam  At the time of the visit.  We reviewed the resident’s history and exam and pertinent patient test results.  I agree with the assessment, diagnosis, and plan of care documented in the resident’s note.  °

## 2020-12-26 NOTE — Telephone Encounter (Signed)
-----   Message from Mitzi Hansen, MD sent at 12/24/2020  3:20 PM EDT ----- I received a telephone call from this patient on Saturday. She says that her left scapular pain is getting worse despite doing what Dr. Marianna Payment and Dr. Marva Panda said to do.  It sounds like it is muscular but my only other thought would be biliary. I sent in some mobic for her to try for a couple days  Triage--please call and re-evaluate pt's symptoms. If symptoms are not improved, please have her come back in for an in person re-evaluation.  Ben--I don't know if you have any other thoughts on whats going on but if she does end up getting re-evaluated in the clinic, maybe just check some LFTs? I figured I would let you know since you are the one that actually saw her on 4/5.  Thanks! Aimee Davis

## 2020-12-26 NOTE — Telephone Encounter (Signed)
F/U call to see how pt is doing today with her pain and medication- no answer. Left message on self-identified vm to call me back at the office.

## 2020-12-26 NOTE — Telephone Encounter (Signed)
Pt has an appt  scheduled for tomorrow per Epic.

## 2020-12-27 ENCOUNTER — Ambulatory Visit (INDEPENDENT_AMBULATORY_CARE_PROVIDER_SITE_OTHER): Payer: Medicare Other | Admitting: Internal Medicine

## 2020-12-27 VITALS — BP 118/68 | HR 77 | Temp 98.4°F | Wt 155.0 lb

## 2020-12-27 DIAGNOSIS — J4540 Moderate persistent asthma, uncomplicated: Secondary | ICD-10-CM | POA: Diagnosis not present

## 2020-12-27 DIAGNOSIS — M62830 Muscle spasm of back: Secondary | ICD-10-CM | POA: Diagnosis not present

## 2020-12-27 DIAGNOSIS — J302 Other seasonal allergic rhinitis: Secondary | ICD-10-CM | POA: Diagnosis not present

## 2020-12-27 MED ORDER — MOMETASONE FUROATE 50 MCG/ACT NA SUSP: NASAL | 6 refills | Status: DC

## 2020-12-27 NOTE — Patient Instructions (Signed)
Thank you, [name] for allowing Aimee Davis to provide your care today. Today we discussed left upper back muscle spasm.    I have ordered the following labs for you:  Lab Orders  No laboratory test(s) ordered today     Tests ordered today:  none  Referrals ordered today:    Referral Orders     Ambulatory referral to Physical Therapy   Medication Changes:   There are no discontinued medications.   No orders of the defined types were placed in this encounter.    Instructions: continue conservative management as we discussed.   Follow up: as needed   Remember:   Should you have any questions or concerns please call the internal medicine clinic at [phone].     Aimee Davis, D.O. [place]

## 2020-12-27 NOTE — Progress Notes (Signed)
CC: muscle spasm  HPI:  [name] is a 65 y.o. female with a past medical history stated below and presents today for muscle spasm. Please see problem based assessment and plan for additional details.  Past Medical History:  Diagnosis Date  . Allergy    seasonal, PCN, antidepressants, bentyl  . Arthritis   . Asthma   . Carpal tunnel syndrome on both sides    right worse than left 2008  . Cataract 06/11/2013  . COPD (chronic obstructive pulmonary disease) ([place])   . COVID-19 virus infection 06/18/2016  . Eczema 05/02/2018  . GERD (gastroesophageal reflux disease)   . Hyperlipidemia    LDL 108 JULY 2013  . IBS (irritable bowel syndrome)   . Multiple nevi 03/01/2015  . Pneumonia   . Positive TB test    From MArch 2009 note : Recent F/u at [facility]. CXRAy negative.  Positive TST in 2007 (37mm).  Unable to tolerate INH due to hepatotoxicity    Current Outpatient Medications on File Prior to Visit  Medication Sig Dispense Refill  . albuterol (VENTOLIN HFA) 108 (90 Base) MCG/ACT inhaler Inhale 2 puffs into the lungs every 6 (six) hours as needed for wheezing or shortness of breath. 8 g 3  . Artificial Tear Ointment (DRY EYES OP) Apply 1 drop to eye daily as needed (dry eyes).    . Ascorbic Acid (VITAMIN C) 100 MG tablet Take 50 mg by mouth daily.    . B Complex-C (B-COMPLEX WITH VITAMIN C) tablet Take 1 tablet by mouth daily.    . cetirizine (ZYRTEC) 10 MG tablet Take 10 mg by mouth daily.    . Cholecalciferol (VITAMIN D) 125 MCG (5000 UT) CAPS Take 1-2 tablets by mouth daily.    . diclofenac Sodium (VOLTAREN) 1 % GEL Apply 4 g topically 4 (four) times daily. 350 g 0  . Fluticasone-Umeclidin-Vilant (TRELEGY ELLIPTA) 200-62.5-25 MCG/INH AEPB Inhale 1 puff into the lungs daily. 60 each 2  . meloxicam (MOBIC) 7.5 MG tablet Take 1 tablet (7.5 mg total) by mouth daily for 7 days. 7 tablet 0  . methocarbamol (ROBAXIN) 500 MG tablet Take 1 tablet (500 mg total) by mouth every 6 (six)  hours as needed for up to 5 days for muscle spasms. 20 tablet 0  . metoprolol tartrate (LOPRESSOR) 25 MG tablet Patient to take 1 tablet by mouth 2 hours prior to procedure 1 tablet 0  . omeprazole (PRILOSEC) 40 MG capsule Take 1 capsule (40 mg total) by mouth daily. 30 capsule 3  . ondansetron (ZOFRAN ODT) 4 MG disintegrating tablet Take 1 tablet (4 mg total) by mouth every 8 (eight) hours as needed for nausea or vomiting. 20 tablet 1  . OVER THE COUNTER MEDICATION MICROTEX VM - FOOD NUTRIENT COMPLEX  2 DAY    . OVER THE COUNTER MEDICATION EO MEGA Essential Oil - 2 daily    . Peppermint Oil (IBGARD) 90 MG CPCR Take 1 capsule by mouth 2 (two) times daily as needed. Take as directed as needed    . polyethylene glycol (MIRALAX) 17 g packet Take 17 g by mouth daily. 14 each 0  . Probiotic Product (PROBIOTIC DAILY PO) Take by mouth.    . rosuvastatin (CRESTOR) 10 MG tablet Take 1 tablet (10 mg total) by mouth daily. 90 tablet 1  . simethicone (GAS-X) 80 MG chewable tablet Use as directed as needed 30 tablet 0  . sucralfate (CARAFATE) 1 g tablet Take 1 tablet (1 g  total) by mouth every 6 (six) hours as needed. 30 tablet 3   No current facility-administered medications on file prior to visit.    Family History  Problem Relation Age of Onset  . Hypertension Mother   . Heart disease Mother   . Diabetes Mother   . Kidney disease Mother   . Stroke Father   . Diabetes Father   . Heart disease Father   . Hyperlipidemia Father   . Hypertension Father   . Kidney disease Father   . Cancer Sister        breast  . Diabetes Sister   . Kidney disease Sister   . Hypertension Sister   . Breast cancer Sister   . Mental illness Brother   . Hypertension Maternal Aunt   . Hypertension Maternal Uncle   . Stomach cancer Maternal Uncle   . Hypertension Paternal Aunt   . Kidney disease Paternal Aunt   . Hypertension Paternal Uncle   . Kidney disease Paternal Uncle   . Breast cancer Sister   . Colon  cancer Neg Hx   . Esophageal cancer Neg Hx   . Pancreatic cancer Neg Hx     Social History   Socioeconomic History  . Marital status: Divorced    Spouse name: Not on file  . Number of children: 2  . Years of education: Not on file  . Highest education level: Not on file  Occupational History  . Occupation: retired  Tobacco Use  . Smoking status: Former Smoker    Packs/day: 0.33    Years: 43.00    Pack years: 14.19    Types: Cigarettes    Quit date: 01/06/2013    Years since quitting: 7.9  . Smokeless tobacco: Never Used  Vaping Use  . Vaping Use: Never used  Substance and Sexual Activity  . Alcohol use: No    Alcohol/week: 0.0 standard drinks  . Drug use: Not Currently    Comment: x 26 yrs.  . Sexual activity: Not on file    Comment: S/P PARTIAL HYSTERECTOMY  Other Topics Concern  . Not on file  Social History Narrative  . Not on file   Social Determinants of Health   Financial Resource Strain: Not on file  Food Insecurity: Not on file  Transportation Needs: Not on file  Physical Activity: Not on file  Stress: Not on file  Social Connections: Not on file  Intimate Partner Violence: Not on file    Review of Systems: ROS negative except for what is noted on the assessment and plan.  Vitals:   12/27/20 0935  BP: 118/68  Pulse: 77  Temp: 98.4 F (36.9 C)  TempSrc: Oral  SpO2: 98%  Weight: 155 lb (70.3 kg)     Physical Exam: Physical Exam Neck:     Comments: Decreased range of motion due to pain in upper left back Cardiovascular:     Rate and Rhythm: Normal rate and regular rhythm.     Pulses: Normal pulses.     Heart sounds: Normal heart sounds.  Pulmonary:     Effort: Pulmonary effort is normal.     Breath sounds: Normal breath sounds.  Musculoskeletal:        General: No swelling.     Cervical back: Tenderness (TTP of upper left trapezius) present. No rigidity.     Comments: Decreased range of motion left upper extremity due to pain  Skin:     General: Skin is warm and dry.  Neurological:  General: No focal deficit present.      Assessment & Plan:   See Encounters Tab for problem based charting.  Patient discussed with Dr. Lars Mage, D.O. [facility], PGY-2 Pager: [phone], Phone: [phone] Date 12/28/2020 Time 5:08 AM

## 2020-12-28 ENCOUNTER — Encounter: Payer: Self-pay | Admitting: Internal Medicine

## 2020-12-28 NOTE — Assessment & Plan Note (Addendum)
Patient presents for reevaluation for her left upper back spasm. She has been seen in the clinic on multiple time in the last week for this problem. She has recently started working out which is likely contributing to her muscle spasm. She was originally prescribed conservative management with OTC pain medications, heat, and light stretching/ectivity modification. She states that she had some improvement of her symptoms. Her symptoms worsened when she tried to return to lifting weights. She was ultimately prescribed a muscle relaxer that made her feel "down" and as a result she has not taken it since. She call the emergency [facility] pager over the weekend as she was experiencing more pain that was limiting her mobility and make her ADLs more difficult to perform. She was prescribed a 7 day course of mobic and set up with an appointment back in the clinic.   On exam, she continues to have tight, ropey/hypertrophied muscles in her left upper back consistent with trapezius muscle spasm. The pain limits her range of motion of her left arm and neck.   I counseled her regarding continuing conservative management and decreasing activity until her symptoms improve. I recommended performing more CV exercise and focus on regaining range of motion instead of lifting weights. I encouraged her to use the mobic as need for pain. I told her we could try a different muscle relaxer in the future if she continues to experience her symptoms. I will also refer her to PT for activity modification.   Plan: - continue conservative management - Cont pain medication as needed - referral for PT - consider gentle massage therapy, counseled to use a tennis ball

## 2020-12-28 NOTE — Progress Notes (Signed)
Internal Medicine Clinic Attending  Case discussed with Dr. Coe  At the time of the visit.  We reviewed the resident's history and exam and pertinent patient test results.  I agree with the assessment, diagnosis, and plan of care documented in the resident's note.  

## 2020-12-29 ENCOUNTER — Encounter: Payer: Self-pay | Admitting: Dietician

## 2021-01-02 ENCOUNTER — Telehealth: Payer: Self-pay

## 2021-01-02 ENCOUNTER — Other Ambulatory Visit: Payer: Self-pay | Admitting: Internal Medicine

## 2021-01-02 DIAGNOSIS — M62830 Muscle spasm of back: Secondary | ICD-10-CM

## 2021-01-02 MED ORDER — MELOXICAM 7.5 MG PO TABS: 7.5000 mg | ORAL_TABLET | Freq: Every day | ORAL | 0 refills | Status: AC

## 2021-01-02 NOTE — Telephone Encounter (Signed)
Refill request received for Meloxicam on 4/13.  Patient was seen in clinic by Dr. Marianna Payment on 4/12, will forward to MD to advise on if this refill is appropriate. Thank you, SChaplin, RN,BSN

## 2021-01-02 NOTE — Telephone Encounter (Signed)
I will give the patient an additional 5 days of mobic. She would really benefit from a muscle relaxer, but did not tolerate Robaxin recently. If she continues to require NSAIDs, then I would try an a switching to a different muscle relaxer instead including cyclobenzaprine.

## 2021-01-09 ENCOUNTER — Ambulatory Visit: Payer: Medicare Other | Attending: Internal Medicine | Admitting: Physical Therapy

## 2021-01-09 ENCOUNTER — Other Ambulatory Visit: Payer: Self-pay

## 2021-01-09 DIAGNOSIS — M25612 Stiffness of left shoulder, not elsewhere classified: Secondary | ICD-10-CM | POA: Diagnosis not present

## 2021-01-09 DIAGNOSIS — M6281 Muscle weakness (generalized): Secondary | ICD-10-CM | POA: Insufficient documentation

## 2021-01-09 DIAGNOSIS — M25512 Pain in left shoulder: Secondary | ICD-10-CM | POA: Insufficient documentation

## 2021-01-09 NOTE — Patient Instructions (Signed)
Access Code: [email or web address] URL: [email or web address] Date: 01/09/2021 Prepared by: Caleb Popp  Exercises  Supine Shoulder Flexion Extension AAROM with Dowel - 1 x daily - 7 x weekly - 1-2 sets - 10 reps Supine Shoulder External Rotation AAROM with Dowel - 1 x daily - 7 x weekly - 1-2 sets - 10 reps Standing Shoulder Abduction AAROM with Dowel - 1 x daily - 7 x weekly - 1-2 sets - 10 reps

## 2021-01-10 ENCOUNTER — Encounter: Payer: Self-pay | Admitting: Physical Therapy

## 2021-01-10 ENCOUNTER — Ambulatory Visit: Payer: Medicare Other | Admitting: Physical Therapy

## 2021-01-10 DIAGNOSIS — M6281 Muscle weakness (generalized): Secondary | ICD-10-CM

## 2021-01-10 DIAGNOSIS — M25512 Pain in left shoulder: Secondary | ICD-10-CM

## 2021-01-10 DIAGNOSIS — M25612 Stiffness of left shoulder, not elsewhere classified: Secondary | ICD-10-CM

## 2021-01-10 NOTE — Therapy (Addendum)
[employer] [place], [place], [address] Phone: [phone]   Fax:  [phone]  Physical Therapy Treatment / Discharge  Patient Details  Name: Aimee Davis MRN: [id number] Date of Birth: 01/18/1956 Referring Provider (PT): Aldine Contes, MD   Encounter Date: 01/10/2021   PT End of Session - 01/10/21 1542     Visit Number 2    Number of Visits 10    Date for PT Re-Evaluation 02/20/21    Authorization Type UHC MCR/MCD    Authorization Time Period FOTO by 6th    Progress Note Due on Visit 10    PT Start Time 1542   pt late   PT Stop Time 1634    PT Time Calculation (min) 52 min    Activity Tolerance Patient limited by pain    Behavior During Therapy [facility] for tasks assessed/performed             Past Medical History:  Diagnosis Date   Allergy    seasonal, PCN, antidepressants, bentyl   Arthritis    Asthma    Carpal tunnel syndrome on both sides    right worse than left 2008   Cataract 06/11/2013   COPD (chronic obstructive pulmonary disease) ([place])    COVID-19 virus infection 06/18/2016   Eczema 05/02/2018   GERD (gastroesophageal reflux disease)    Hyperlipidemia    LDL 108 JULY 2013   IBS (irritable bowel syndrome)    Multiple nevi 03/01/2015   Pneumonia    Positive TB test    From MArch 2009 note : Recent F/u at [facility]. CXRAy negative.  Positive TST in 2007 (48m).  Unable to tolerate INH due to hepatotoxicity    Past Surgical History:  Procedure Laterality Date   BREAST CYST EXCISION Right 2011   BREAST EXCISIONAL BIOPSY     benign   BREAST EXCISIONAL BIOPSY Right 1990   benign   INDUCED ABORTION  2005   VAGINAL HYSTERECTOMY  1983   partial; for abnormal bleeding    There were no vitals filed for this visit.   Subjective Assessment - 01/10/21 1545     Subjective I was a little confused about the exercise with arm turning out with the hand placement (ER AAROM). Pain is still really  high.    Patient Stated Goals get rid of pain    Currently in Pain? Yes    Pain Score 9     Pain Location Scapula    Pain Orientation Left    Pain Descriptors / Indicators Sharp;Throbbing    Pain Type Acute pain    Pain Onset More than a month ago    Pain Frequency Intermittent                  [facility] PT Assessment - 01/10/21 1645       AROM   Overall AROM Comments AROM and PROM grossly limited secondary to pain      Palpation   Spinal mobility hypomobility throughout thoracic region with PA's and various levels caused referring numbness/tingling down L arm, unable to specificy at which levels    Palpation comment TTP serratus anterior and supraspinatus; shooting pain down the L arm with serratus anterior palpation                                        [facility] Adult PT Treatment/Exercise -  01/10/21 1637       Self-Care   Self-Care Other Self-Care Comments    Other Self-Care Comments  further assessment, palpation and hypomobility testing      Shoulder Exercises: Supine   External Rotation Left;10 reps;AAROM    External Rotation Limitations with dowel    Flexion Both;AAROM    Flexion Limitations with dowel      Shoulder Exercises: Seated   Retraction 10 reps;Both;AROM    Retraction Limitations verbal cueing needed      Shoulder Exercises: Prone   Retraction 10 reps;Both    Retraction Weight (lbs) tactile cueing needed for shoulder retraction    Retraction Limitations d/c, caused pain      Shoulder Exercises: Standing   Flexion Left;10 reps    Flexion Limitations towel slide at counter    ABduction 10 reps;Left;AAROM    Other Standing Exercises abduction counter towel slides x10 L    Other Standing Exercises L shoulder flexion and abduction isometrics x5 each   d/c due to pain with minimal muscle contraction     Modalities   Modalities Cryotherapy      Cryotherapy   Number Minutes Cryotherapy 10 Minutes    Cryotherapy Location Shoulder     Type of Cryotherapy Ice pack                          PT Education - 01/10/21 1644     Education Details HEP update, only perform exercises once a day, give herself a day off if she needs it    Person(s) Educated Patient    Methods Explanation;Demonstration;Handout;Tactile cues;Verbal cues    Comprehension Verbalized understanding;Returned demonstration;Need further instruction;Verbal cues required;Tactile cues required              PT Short Term Goals - 01/10/21 0832       PT SHORT TERM GOAL #1   Title Pt will be I with initial HEP    Time 2    Period Weeks    Status New    Target Date 01/23/21      PT SHORT TERM GOAL #2   Title Pt will report 20% decrease in pain in L UE during motion.    Time 3    Period Weeks    Status New    Target Date 01/30/21                PT Long Term Goals - 01/10/21 0833       PT LONG TERM GOAL #1   Title Pt will be I with advanced HEP    Baseline not given    Time 6    Period Weeks    Status New    Target Date 02/20/21      PT LONG TERM GOAL #2   Title Pt will go from 28% to 52% on FOTO to show functional improvement    Baseline 28%    Time 6    Period Weeks    Status New    Target Date 02/20/21      PT LONG TERM GOAL #3   Title Pt will increase AROM flexion to 90 degrees to show increased tolerance to overhead activity.    Baseline 52 degrees    Time 6    Period Weeks    Status New    Target Date 02/20/21      PT LONG TERM GOAL #4   Title Pt will increase L abduction to  90 degrees to increase overhead functional ability of the L UE.    Baseline 45 degrees abduction    Time 6    Period Weeks    Status New    Target Date 02/20/21      PT LONG TERM GOAL #5   Title Pt will report 50% decrease in pain with L UE usage in order to help return to PLOF    Baseline 9/10 pain at baseline today    Time 6    Period Weeks    Status New    Target Date 02/20/21                        Plan -  01/10/21 1649     Clinical Impression Statement Pt showed increased pain with tx and arrived late thus limiting session. Decreased carryover noted with exercises so HEP further reviewed. Nonconcordiant pain patterns changed throughout session from underneath shoulder blade, to L arm shooting pain, and other various periscapular areas. Hypomobility noted in the thoracic region, possibly secondary to guarding. Extremely TTP at serratus anterior with shooting pain going down the L arm. Unable to tolerate isometrics with minimal force. Towel slides into flexion and abduction performed and unable to tolerate greater than around 25 degrees; pt told to focus on deep breathing throughout and focus on proprioception of arm so as to not keep pushing into pain. Continues to benefit from skilled PT in order to address high pain levels in order to increase ROM and periscapular strenghtening in order to return to PLOF and functional use of the L UE.    PT Treatment/Interventions ADLs/Self Care Home Management;Moist Heat;Cryotherapy;Electrical Stimulation;Functional mobility training;Therapeutic activities;Therapeutic exercise;Patient/family education;Manual techniques;Passive range of motion;Dry needling;Taping;Joint Manipulations;Ultrasound;Iontophoresis 73m/ml Dexamethasone    PT Next Visit Plan Review HEP and progress PRN, manual consisting of soft tissue as tolerated, scapular mobs, shoulder PROM/AAROM, periscapular strengthening as tolerated, isometrics, retractions seated, modalities for pain relief PRN    PT Home Exercise Plan Access Code: [email or web address]    Consulted and Agree with Plan of Care Patient             Patient will benefit from skilled therapeutic intervention in order to improve the following deficits and impairments:  Pain,Decreased mobility,Decreased strength,Postural dysfunction,Improper body mechanics,Decreased activity tolerance,Decreased range of motion,Impaired UE functional use,Increased muscle  spasms  Visit Diagnosis: Acute pain of left shoulder  Stiffness of left shoulder, not elsewhere classified  Muscle weakness (generalized)      Problem List Patient Active Problem List   Diagnosis Date Noted   Back muscle spasm 12/21/2020   Chest pressure 11/07/2020   Memory loss of unknown cause 12/08/2019   MDD (major depressive disorder) 12/05/2019   Restless leg syndrome 11/16/2019   Belching 09/08/2019   Abdominal pain, chronic, epigastric 09/08/2019   Hair loss 07/07/2019   Tinnitus 07/07/2019   Macular rash 05/20/2019   Skin macule 04/21/2019   Seborrheic keratosis 04/21/2019   History of colon polyps 06/24/2018   Constitutional neutropenia ([place] 02/21/2018   Seasonal allergies 03/29/2017   Chronic fatigue 07/16/2013   Healthcare maintenance 06/11/2013   Primary insomnia 06/05/2012   [place] 03/17/2010   Asthma 01/31/2007   Gastroesophageal reflux disease 11/14/2006   Irritable bowel syndrome 11/14/2006    CCaleb Popp SPT 01/10/2021, 5:07 PM  [facility] [address]Phone: [phone]  Fax:  [phone] Name: Aimee Davis  MRN: [id number] Date of Birth: December 21, 1955   PHYSICAL THERAPY DISCHARGE SUMMARY  Visits from Start of Care: 2  Current functional level related to goals / functional outcomes: See above   Remaining deficits: See above   Education / Equipment: HEP   Patient agrees to discharge. Patient goals were not met. Patient is being discharged due to not returning since the last visit.

## 2021-01-10 NOTE — Patient Instructions (Signed)
Access Code: [email or web address] URL: [email or web address] Date: 01/10/2021 Prepared by: Caleb Popp  Exercises Supine Shoulder Flexion Extension AAROM with Dowel - 1 x daily - 7 x weekly - 1-2 sets - 10 reps Supine Shoulder External Rotation AAROM with Dowel - 1 x daily - 7 x weekly - 1-2 sets - 10 reps Standing Shoulder Abduction AAROM with Dowel - 1 x daily - 7 x weekly - 1-2 sets - 10 reps Shoulder Abduction Towel Slide at Table Top - 1 x daily - 7 x weekly - 3 sets - 10 sets hold Standing Single Arm Shoulder Flexion Towel Slide at Table Top - 1 x daily - 7 x weekly - 3 sets - 10 reps Seated Scapular Retraction - 1 x daily - 7 x weekly - 1-3 sets - 10 reps

## 2021-01-10 NOTE — Therapy (Addendum)
[place], [place], [address] Phone: [phone]   Fax:  [phone]  Physical Therapy Evaluation  Patient Details  Name: DANALY BARI MRN: [id number] Date of Birth: 1956-03-30 Referring Provider (PT): Aldine Contes, MD   Encounter Date: 01/09/2021   PT End of Session - 01/09/21 1453     Visit Number 1    Number of Visits 10    Date for PT Re-Evaluation 02/20/21    Authorization Type UHC MCR/MCD    Authorization Time Period FOTO by 6th    Progress Note Due on Visit 10    PT Start Time 1452   pt late   PT Stop Time 1540    PT Time Calculation (min) 48 min    Activity Tolerance Patient tolerated treatment well;Patient limited by pain    Behavior During Therapy [facility] for tasks assessed/performed             Past Medical History:  Diagnosis Date   Allergy    seasonal, PCN, antidepressants, bentyl   Arthritis    Asthma    Carpal tunnel syndrome on both sides    right worse than left 2008   Cataract 06/11/2013   COPD (chronic obstructive pulmonary disease) ([place])    COVID-19 virus infection 06/18/2016   Eczema 05/02/2018   GERD (gastroesophageal reflux disease)    Hyperlipidemia    LDL 108 JULY 2013   IBS (irritable bowel syndrome)    Multiple nevi 03/01/2015   Pneumonia    Positive TB test    From MArch 2009 note : Recent F/u at [facility]. CXRAy negative.  Positive TST in 2007 (74mm).  Unable to tolerate INH due to hepatotoxicity    Past Surgical History:  Procedure Laterality Date   BREAST CYST EXCISION Right 2011   BREAST EXCISIONAL BIOPSY     benign   BREAST EXCISIONAL BIOPSY Right 1990   benign   INDUCED ABORTION  2005   VAGINAL HYSTERECTOMY  1983   partial; for abnormal bleeding    There were no vitals filed for this visit.    Subjective Assessment - 01/09/21 1454     Subjective This has been going on for over a month. I was moving some containers in my storage container and  afterwards felt some pain in my L shoulder. I tried to roll it out with a ball (physio ball), but it still felt like it was there. At the time it felt like a cramp in my shoulder. Since it was stil there I tried to go to the gym and do some shoulder/arm exercises and it made it feel worse. After that, the pain was so bad that I had to go to the doctor. It feels like a stabbing pain underneath my shoulder blade. I am still walking, but have stopped doing arm exercises as my doctors told me to only do what PT gave me. Also said to limit my lifting to 5 pounds.    Pertinent History asthma, COPD, covid    Limitations Lifting;Standing;Walking;Writing;House hold activities    Patient Stated Goals get rid of pain    Currently in Pain? Yes    Pain Score 9     Pain Location Scapula   under shoulder blade   Pain Orientation Left    Pain Descriptors / Indicators Sharp;Throbbing   like a toothache   Pain Type Acute pain    Pain Onset More than a month ago    Pain Frequency Intermittent  Aggravating Factors  bending, squatting, opening the refrigerator, bending over, using it, going to the bathroom (wiping), and driving her car    Pain Relieving Factors heat and cold rotation, tylenol and ibuprofen rotation    Effect of Pain on Daily Activities ADL's/IADL's                  [facility] PT Assessment - 01/10/21 0001       Assessment   Medical Diagnosis Back muscle spasm    Referring Provider (PT) Aldine Contes, MD    Onset Date/Surgical Date --   > 1 month ago   Prior Therapy 5 years ago for LBP      Precautions   Precautions None      Restrictions   Weight Bearing Restrictions No      Balance Screen   Has the patient fallen in the past 6 months No    Has the patient had a decrease in activity level because of a fear of falling?  Yes    Is the patient reluctant to leave their home because of a fear of falling?  No      Prior Function   Level of Independence Independent      Cognition    Overall Cognitive Status Within Functional Limits for tasks assessed      Observation/Other Assessments   Observations pt emotional and showing frusturation at time about her shoulder    Focus on Therapeutic Outcomes (FOTO)  28% functional status (predicted 52%)      Sensation   Light Touch Appears Intact      Coordination   Gross Motor Movements are Fluid and Coordinated No    Coordination and Movement Description hesistant and slow movement      AROM   Overall AROM  Deficits;Due to pain    Overall AROM Comments flexion and abduction most painful    AROM Assessment Site Ankle;Shoulder    Right/Left Shoulder Right;Left    Left Shoulder Extension 68 Degrees    Left Shoulder Flexion 52 Degrees   painful   Left Shoulder ABduction 45 Degrees   painful   Left Shoulder External Rotation 85 Degrees      PROM   Overall PROM Comments painful arc type motion noted (but started below normal painful arc starting point, roughly around 20 degrees)that started and then stopped, then increased pain noted at roughly 45 degrees with flexion and abduction    PROM Assessment Site Shoulder    Right/Left Shoulder Left    Left Shoulder Flexion --   roughly 45, 65 degrees with dowel AAROM   Left Shoulder ABduction --   roughly 45   Left Shoulder External Rotation --   rougly 70 degrees     Strength   Overall Strength Deficits;Due to pain    Strength Assessment Site Shoulder    Right/Left Shoulder Right;Left    Right Shoulder Flexion 3/5   unable to get into proper testing position and unable to tolerate resistance   Right Shoulder Extension 4-/5    Right Shoulder ABduction 3/5   unable to get into proper testing position and unable to tolerate resistance   Right Shoulder Internal Rotation 4/5   painful   Right Shoulder External Rotation 4+/5                                Objective measurements completed on examination: See above findings.  [facility] Adult PT  Treatment/Exercise - 01/10/21 0001       Exercises   Exercises Shoulder      Shoulder Exercises: Supine   External Rotation 10 reps;Both    External Rotation Limitations with dowel    Flexion Both;10 reps    Flexion Limitations with dowel      Shoulder Exercises: Standing   Horizontal ABduction 10 reps;Left    Horizontal ABduction Limitations with dowel                          PT Education - 01/09/21 1723     Education Details HEP, sx explanation, POC    Person(s) Educated Patient    Methods Explanation;Demonstration;Handout    Comprehension Verbalized understanding;Returned demonstration;Need further instruction              PT Short Term Goals - 01/10/21 6195       PT SHORT TERM GOAL #1   Title Pt will be I with initial HEP    Time 2    Period Weeks    Status New    Target Date 01/23/21      PT SHORT TERM GOAL #2   Title Pt will report 20% decrease in pain in L UE during motion.    Time 3    Period Weeks    Status New    Target Date 01/30/21                PT Long Term Goals - 01/10/21 0833       PT LONG TERM GOAL #1   Title Pt will be I with advanced HEP    Baseline not given    Time 6    Period Weeks    Status New    Target Date 02/20/21      PT LONG TERM GOAL #2   Title Pt will go from 28% to 52% on FOTO to show functional improvement    Baseline 28%    Time 6    Period Weeks    Status New    Target Date 02/20/21      PT LONG TERM GOAL #3   Title Pt will increase AROM flexion to 90 degrees to show increased tolerance to overhead activity.    Baseline 52 degrees    Time 6    Period Weeks    Status New    Target Date 02/20/21      PT LONG TERM GOAL #4   Title Pt will increase L abduction to 90 degrees to increase overhead functional ability of the L UE.    Baseline 45 degrees abduction    Time 6    Period Weeks    Status New    Target Date 02/20/21      PT LONG TERM GOAL #5   Title Pt will report 50% decrease in  pain with L UE usage in order to help return to PLOF    Baseline 9/10 pain at baseline today    Time 6    Period Weeks    Status New    Target Date 02/20/21                         Plan - 01/09/21 1513     Clinical Impression Statement Pt is a 65 y/o F presenting to OPPT for acute L shoulder pain. Examination limited due to pt being late and increased time  needed for FOTO. Examination revealed AROM and PROM most limited and painful with flexion and abduction, strength deficits throughout the shoulder but most limited with flexion and abduction. Demonstrates a painful arc type of motion but pain starts at around 20-25 degrees, so s/sx no consistent with RTC pathology but possible impingement etiology. Pt also reports pain with squatting and bending mostly in her shoulder, but form not able to be looked at this session due to time constraints. Pt given PROM flexion, abduction, and ER with dowel exercises this session due to high irritability. Pt would benefit from skilled PT in order to address the above deficits and increase functional strength and mobility in order to return to PLOF.    Personal Factors and Comorbidities Comorbidity 2    Comorbidities asthma, COPD    Examination-Activity Limitations Bathing;Bed Mobility;Locomotion Level;Squat;Bend;Caring for Others;Carry;Dressing;Lift;Sleep;Reach Overhead;Toileting    Examination-Participation Restrictions [employer];Shop;Driving;Cleaning;Meal Prep    Stability/Clinical Decision Making Stable/Uncomplicated    Clinical Decision Making Low    Rehab Potential Good    PT Frequency Other (comment)    PT Duration 6 weeks   2x/week for 3 weeks and then 1x/week for 3 weeks   PT Treatment/Interventions ADLs/Self Care Home Management;Moist Heat;Cryotherapy;Electrical Stimulation;Functional mobility training;Therapeutic activities;Therapeutic exercise;Patient/family education;Manual techniques;Passive range of motion;Dry  needling;Taping;Joint Manipulations;Ultrasound;Iontophoresis 4mg /ml Dexamethasone    PT Next Visit Plan cervical ROM, TTP testing, prone periscapular strengthing    PT Home Exercise Plan Access Code: [email or web address]    Consulted and Agree with Plan of Care Patient             Patient will benefit from skilled therapeutic intervention in order to improve the following deficits and impairments:  Pain,Decreased mobility,Decreased strength,Postural dysfunction,Improper body mechanics,Decreased activity tolerance,Decreased range of motion,Impaired UE functional use,Increased muscle spasms  Visit Diagnosis: Acute pain of left shoulder  Stiffness of left shoulder, not elsewhere classified  Muscle weakness (generalized)      Problem List Patient Active Problem List   Diagnosis Date Noted   Back muscle spasm 12/21/2020   Chest pressure 11/07/2020   Memory loss of unknown cause 12/08/2019   MDD (major depressive disorder) 12/05/2019   Restless leg syndrome 11/16/2019   Belching 09/08/2019   Abdominal pain, chronic, epigastric 09/08/2019   Hair loss 07/07/2019   Tinnitus 07/07/2019   Macular rash 05/20/2019   Skin macule 04/21/2019   Seborrheic keratosis 04/21/2019   History of colon polyps 06/24/2018   Constitutional neutropenia ([place]) 02/21/2018   Seasonal allergies 03/29/2017   Chronic fatigue 07/16/2013   Healthcare maintenance 06/11/2013   Primary insomnia 06/05/2012   COPD (chronic obstructive pulmonary disease) ([place]) 03/17/2010   Asthma 01/31/2007   Gastroesophageal reflux disease 11/14/2006   Irritable bowel syndrome 11/14/2006    Caleb Popp, SPT 01/10/2021, 8:58 AM  [facility] [address] [place], [place], [address] Phone: [phone]   Fax:  [phone]  Name: RAKELL RAPPLEYEA MRN: [id number] Date of Birth: 1956/06/25

## 2021-01-15 ENCOUNTER — Emergency Department (HOSPITAL_COMMUNITY): Payer: Medicare Other

## 2021-01-15 ENCOUNTER — Emergency Department (HOSPITAL_COMMUNITY)
Admission: EM | Admit: 2021-01-15 | Discharge: 2021-01-15 | Disposition: A | Discharge status: Home / Self Care | Payer: Medicare Other | Attending: Emergency Medicine | Admitting: Emergency Medicine

## 2021-01-15 ENCOUNTER — Encounter (HOSPITAL_COMMUNITY): Payer: Self-pay | Admitting: Emergency Medicine

## 2021-01-15 ENCOUNTER — Other Ambulatory Visit: Payer: Self-pay

## 2021-01-15 DIAGNOSIS — S4990XA Unspecified injury of shoulder and upper arm, unspecified arm, initial encounter: Secondary | ICD-10-CM

## 2021-01-15 DIAGNOSIS — Z8616 Personal history of COVID-19: Secondary | ICD-10-CM | POA: Diagnosis not present

## 2021-01-15 DIAGNOSIS — Z7952 Long term (current) use of systemic steroids: Secondary | ICD-10-CM | POA: Diagnosis not present

## 2021-01-15 DIAGNOSIS — Z87891 Personal history of nicotine dependence: Secondary | ICD-10-CM | POA: Insufficient documentation

## 2021-01-15 DIAGNOSIS — X500XXA Overexertion from strenuous movement or load, initial encounter: Secondary | ICD-10-CM | POA: Insufficient documentation

## 2021-01-15 DIAGNOSIS — Z79899 Other long term (current) drug therapy: Secondary | ICD-10-CM | POA: Insufficient documentation

## 2021-01-15 DIAGNOSIS — J45909 Unspecified asthma, uncomplicated: Secondary | ICD-10-CM | POA: Insufficient documentation

## 2021-01-15 DIAGNOSIS — J449 Chronic obstructive pulmonary disease, unspecified: Secondary | ICD-10-CM | POA: Insufficient documentation

## 2021-01-15 DIAGNOSIS — M25512 Pain in left shoulder: Secondary | ICD-10-CM

## 2021-01-15 DIAGNOSIS — S4992XA Unspecified injury of left shoulder and upper arm, initial encounter: Secondary | ICD-10-CM | POA: Diagnosis not present

## 2021-01-15 MED ORDER — HYDROCODONE-ACETAMINOPHEN 5-325 MG PO TABS
1.0000 | ORAL_TABLET | Freq: Once | ORAL | Status: AC
  Administered 2021-01-15: 1 via ORAL
  Filled 2021-01-15: qty 1

## 2021-01-15 MED ORDER — LIDOCAINE 5 % EX PTCH
1.0000 | MEDICATED_PATCH | CUTANEOUS | Status: DC
  Administered 2021-01-15: 1 via TRANSDERMAL
  Filled 2021-01-15: qty 1

## 2021-01-15 NOTE — ED Provider Notes (Signed)
[facility] DEPT Provider Note   CSN: [id number] Arrival date & time: 01/15/21  1907     History Chief Complaint  Patient presents with  . Shoulder Pain    Aimee Davis is a 65 y.o. female with past medical history as listed below.   HPI Patient presents to emergency department today with chief complaint of left shoulder x1 month.  She states the pain for started when she was caring heavy containers that she was moving into storage.  She states later in the day after the heavy lifting she had aching pain in the back of her left shoulder.  She has tried to do home shoulder and arm exercises however thinks that made the pain feel worse.  Patient recently started physical therapy for the same pain.  She states despite taking therapy sessions pain has not improved.  The pain has significantly worsened over the last 3 days.  She has tried multiple over-the-counter medications including Tylenol, Motrin, Aspercreme, muscle relaxers which she does not feel to be helping.  She denies any numbness, tingling, swelling.  Denies any fall.  Past Medical History:  Diagnosis Date  . Allergy    seasonal, PCN, antidepressants, bentyl  . Arthritis   . Asthma   . Carpal tunnel syndrome on both sides    right worse than left 2008  . Cataract 06/11/2013  . COPD (chronic obstructive pulmonary disease) ([place])   . COVID-19 virus infection 06/18/2016  . Eczema 05/02/2018  . GERD (gastroesophageal reflux disease)   . Hyperlipidemia    LDL 108 JULY 2013  . IBS (irritable bowel syndrome)   . Multiple nevi 03/01/2015  . Pneumonia   . Positive TB test    From MArch 2009 note : Recent F/u at [facility]. CXRAy negative.  Positive TST in 2007 (40mm).  Unable to tolerate INH due to hepatotoxicity    Patient Active Problem List   Diagnosis Date Noted  . Back muscle spasm 12/21/2020  . Chest pressure 11/07/2020  . Memory loss of unknown cause 12/08/2019  . MDD (major depressive  disorder) 12/05/2019  . Restless leg syndrome 11/16/2019  . Belching 09/08/2019  . Abdominal pain, chronic, epigastric 09/08/2019  . Hair loss 07/07/2019  . Tinnitus 07/07/2019  . Macular rash 05/20/2019  . Skin macule 04/21/2019  . Seborrheic keratosis 04/21/2019  . History of colon polyps 06/24/2018  . Constitutional neutropenia ([place]) 02/21/2018  . Seasonal allergies 03/29/2017  . Chronic fatigue 07/16/2013  . Healthcare maintenance 06/11/2013  . Primary insomnia 06/05/2012  . COPD (chronic obstructive pulmonary disease) ([place]) 03/17/2010  . Asthma 01/31/2007  . Gastroesophageal reflux disease 11/14/2006  . Irritable bowel syndrome 11/14/2006    Past Surgical History:  Procedure Laterality Date  . BREAST CYST EXCISION Right 2011  . BREAST EXCISIONAL BIOPSY     benign  . BREAST EXCISIONAL BIOPSY Right 1990   benign  . INDUCED ABORTION  2005  . VAGINAL HYSTERECTOMY  1983   partial; for abnormal bleeding     OB History   No obstetric history on file.     Family History  Problem Relation Age of Onset  . Hypertension Mother   . Heart disease Mother   . Diabetes Mother   . Kidney disease Mother   . Stroke Father   . Diabetes Father   . Heart disease Father   . Hyperlipidemia Father   . Hypertension Father   . Kidney disease Father   . Cancer Sister  breast  . Diabetes Sister   . Kidney disease Sister   . Hypertension Sister   . Breast cancer Sister   . Mental illness Brother   . Hypertension Maternal Aunt   . Hypertension Maternal Uncle   . Stomach cancer Maternal Uncle   . Hypertension Paternal Aunt   . Kidney disease Paternal Aunt   . Hypertension Paternal Uncle   . Kidney disease Paternal Uncle   . Breast cancer Sister   . Colon cancer Neg Hx   . Esophageal cancer Neg Hx   . Pancreatic cancer Neg Hx     Social History   Tobacco Use  . Smoking status: Former Smoker    Packs/day: 0.33    Years: 43.00    Pack years: 14.19    Types:  Cigarettes    Quit date: 01/06/2013    Years since quitting: 8.0  . Smokeless tobacco: Never Used  Vaping Use  . Vaping Use: Never used  Substance Use Topics  . Alcohol use: No    Alcohol/week: 0.0 standard drinks  . Drug use: Not Currently    Comment: x 26 yrs.    Home Medications Prior to Admission medications   Medication Sig Start Date End Date Taking? Authorizing Provider  albuterol (VENTOLIN HFA) 108 (90 Base) MCG/ACT inhaler Inhale 2 puffs into the lungs every 6 (six) hours as needed for wheezing or shortness of breath. 01/14/20   Jeanmarie Hubert, MD  Artificial Tear Ointment (DRY EYES OP) Apply 1 drop to eye daily as needed (dry eyes).    [provider]  Ascorbic Acid (VITAMIN C) 100 MG tablet Take 50 mg by mouth daily.    [provider]  B Complex-C (B-COMPLEX WITH VITAMIN C) tablet Take 1 tablet by mouth daily.    [provider]  cetirizine (ZYRTEC) 10 MG tablet Take 10 mg by mouth daily.    [provider]  Cholecalciferol (VITAMIN D) 125 MCG (5000 UT) CAPS Take 1-2 tablets by mouth daily.    [provider]  diclofenac Sodium (VOLTAREN) 1 % GEL Apply 4 g topically 4 (four) times daily. 12/20/20   Marianna Payment, MD  Fluticasone-Umeclidin-Vilant (TRELEGY ELLIPTA) 200-62.5-25 MCG/INH AEPB Inhale 1 puff into the lungs daily. 11/28/20   Seawell, Jaimie A, DO  metoprolol tartrate (LOPRESSOR) 25 MG tablet Patient to take 1 tablet by mouth 2 hours prior to procedure 11/24/20   Freada Bergeron, MD  mometasone (NASONEX) 50 MCG/ACT nasal spray PLACE 2 SPRAYS IN EACH NOSTRIL ONCE DAILY AS NEEDED. 12/27/20   Marianna Payment, MD  omeprazole (PRILOSEC) 40 MG capsule Take 1 capsule (40 mg total) by mouth daily. 09/15/20   Armbruster, Carlota Raspberry, MD  ondansetron (ZOFRAN ODT) 4 MG disintegrating tablet Take 1 tablet (4 mg total) by mouth every 8 (eight) hours as needed for nausea or vomiting. 09/15/20   Armbruster, Carlota Raspberry, MD  OVER THE COUNTER  MEDICATION MICROTEX VM - FOOD NUTRIENT COMPLEX  2 DAY    [provider]  OVER THE COUNTER MEDICATION EO MEGA Essential Oil - 2 daily    [provider]  Peppermint Oil (IBGARD) 90 MG CPCR Take 1 capsule by mouth 2 (two) times daily as needed. Take as directed as needed 12/07/19   Armbruster, Carlota Raspberry, MD  polyethylene glycol (MIRALAX) 17 g packet Take 17 g by mouth daily. 08/05/20   Armbruster, Carlota Raspberry, MD  Probiotic Product (PROBIOTIC DAILY PO) Take by mouth.    [provider]  rosuvastatin (CRESTOR) 10 MG tablet Take 1 tablet (10 mg total) by mouth daily. 12/15/20   Freada Bergeron, MD  simethicone (GAS-X) 80 MG chewable tablet Use as directed as needed 12/07/19   Armbruster, Carlota Raspberry, MD  sucralfate (CARAFATE) 1 g tablet Take 1 tablet (1 g total) by mouth every 6 (six) hours as needed. 09/15/20   Armbruster, Carlota Raspberry, MD    Allergies    Penicillins and Levsin [hyoscyamine]  Review of Systems   Review of Systems All other systems are reviewed and are negative for acute change except as noted in the HPI.  Physical Exam Updated Vital Signs BP (!) 139/91 (BP Location: Left Arm)   Pulse 87   Temp 97.8 F (36.6 C) (Oral)   Resp 17   Ht 5' 3.5" (1.613 m)   Wt 70.3 kg   SpO2 98%   BMI 27.03 kg/m   Physical Exam Vitals and nursing note reviewed.  Constitutional:      Appearance: She is well-developed. She is not ill-appearing or toxic-appearing.  HENT:     Head: Normocephalic and atraumatic.     Nose: Nose normal.  Eyes:     General: No scleral icterus.       Right eye: No discharge.        Left eye: No discharge.     Conjunctiva/sclera: Conjunctivae normal.  Neck:     Vascular: No JVD.     Comments: No tenderness to palpation of paraspinal muscles of cervical spine. Cardiovascular:     Rate and Rhythm: Normal rate and regular rhythm.     Pulses: Normal pulses.          Radial pulses are 2+ on the right side and 2+ on the left side.     Heart  sounds: Normal heart sounds.  Pulmonary:     Effort: Pulmonary effort is normal.     Breath sounds: Normal breath sounds.  Abdominal:     General: There is no distension.  Musculoskeletal:     Cervical back: Normal range of motion.     Comments: Tender to palpation of left scapula.  No obvious deformity of shoulder.  No overlying skin changes.  Compartments in left upper extremity are soft.  Full range of motion of left elbow and wrist.  Patient has pain with empty can test.  Skin:    General: Skin is warm and dry.     Capillary Refill: Capillary refill takes less than 2 seconds.  Neurological:     Mental Status: She is oriented to person, place, and time.     GCS: GCS eye subscore is 4. GCS verbal subscore is 5. GCS motor subscore is 6.     Comments: Fluent speech, no facial droop.  Psychiatric:        Behavior: Behavior normal.     ED Results / Procedures / Treatments   Labs (all labs ordered are listed, but only abnormal results are displayed) Labs Reviewed - No data to display  EKG None  Radiology DG Shoulder Left  Result Date: 01/15/2021 CLINICAL DATA:  Initial evaluation for acute left shoulder injury. EXAM: LEFT SHOULDER - 2+ VIEW COMPARISON:  None. FINDINGS: No acute fracture or dislocation. Acromioclavicular and glenohumeral articulations maintained. Chronic Hill-Sachs deformity at the left humeral head. Mild osteoarthritic changes of the glenohumeral joint. No soft tissue abnormality. Visualized left hemithorax clear. IMPRESSION: 1. No acute osseous abnormality about the left shoulder. 2. Chronic Hill-Sachs deformity at the left humeral head.  Electronically Signed   By: Jeannine Boga M.D.   On: 01/15/2021 21:41    Procedures Procedures   Medications Ordered in ED Medications  lidocaine (LIDODERM) 5 % 1 patch (1 patch Transdermal Patch Applied 01/15/21 2118)  HYDROcodone-acetaminophen (NORCO/VICODIN) 5-325 MG per tablet 1 tablet (1 tablet Oral Given 01/15/21 2118)     ED Course  I have reviewed the triage vital signs and the nursing notes.  Pertinent labs & imaging results that were available during my care of the patient were reviewed by me and considered in my medical decision making (see chart for details).    MDM Rules/Calculators/A&P                          History provided by patient with additional history obtained from chart review.    Patient presents to the ED with complaints of pain to the left shoulder. Exam without obvious deformity or open wounds. ROM decreased 2/2 to pain. Tender to palpation over scapula. NVI distally. Xray negative for fracture/dislocation, does show chronic Hill-Sachs deformity at the left humeral head. No indications for septic joint.  Agree with radiologist impression. Dose of norco given here with lidoderm patch. Reassessed patient and pain has significantly improved. She plans to take Tylenol and Motrin at home for pain as she wants to avoid narcotics. I discussed results, treatment plan, need for ortho follow-up, and return precautions with the patient. Provided opportunity for questions, patient confirmed understanding and are in agreement with plan.    Portions of this note were generated with [employer]. Dictation errors may occur despite best attempts at proofreading.  Final Clinical Impression(s) / ED Diagnoses Final diagnoses:  Acute pain of left shoulder    Rx / DC Orders ED Discharge Orders    None       Barrie Folk, PA-C 01/15/21 2240    Varney Biles, MD 01/17/21 1646

## 2021-01-15 NOTE — Discharge Instructions (Signed)
Xray of left shoulder shows chronic Hill-Sachs deformity at the left humeral head.  The Hill-Sachs defect occurs when there is an injury to the bone and cartilage of the humeral head.  The shoulder joint is made up of the humeral head and the glenoid bone (the socket). Ligaments, cartilage, and tendons help hold these bones in place.   Follow up with orthopedics Dr. Griffin Basil. Call his office first thing tomorrow morning to schedule follow up appointment.    Try over the counter lidocaine patches to help with pain

## 2021-01-15 NOTE — ED Triage Notes (Signed)
Patient arrives complaining of acute on chronic left shoulder pain. Patient states she has attempted multiple over the counter remedies without relief.

## 2021-01-18 ENCOUNTER — Ambulatory Visit: Payer: Medicare Other | Admitting: Physical Therapy

## 2021-01-18 ENCOUNTER — Telehealth: Payer: Self-pay | Admitting: Student

## 2021-01-18 NOTE — Telephone Encounter (Signed)
Pt requesting a call back to discuss hr last office visit on 12/27/2020.

## 2021-01-18 NOTE — Telephone Encounter (Signed)
Return pt's call. Stated she saw the doctor on 4/12 and was dx with muscle spasm.  She tried ice packs, lidocaine patches,motrin then tylenol. Stated the pain (it's very painful) is getting worse; stated unable to daily activities w/o being in pain (unable to drive). Stated she has been in pain x 7 weeks; upset, crying. Stated pain was so bad she went to the ED on Sunday. Shoulder x-ray done - dx with Chronic Hill-Sachs deformity. They scheduled her an appt to see a specialist; she had called them back for a sooner appt - she will see Dr Griffin Basil tomorrow (ortho). Stated she wanted to let her doctor know what's going on with her.

## 2021-01-19 DIAGNOSIS — M542 Cervicalgia: Secondary | ICD-10-CM | POA: Diagnosis not present

## 2021-01-20 ENCOUNTER — Telehealth: Payer: Self-pay | Admitting: Physical Therapy

## 2021-01-20 ENCOUNTER — Ambulatory Visit: Payer: Medicare Other | Attending: Internal Medicine | Admitting: Physical Therapy

## 2021-01-20 NOTE — Telephone Encounter (Signed)
Left message reminding pt of next appt and missed appt today.

## 2021-01-23 ENCOUNTER — Telehealth: Payer: Self-pay

## 2021-01-23 ENCOUNTER — Other Ambulatory Visit: Payer: Self-pay | Admitting: Student

## 2021-01-23 MED ORDER — ALBUTEROL SULFATE HFA 108 (90 BASE) MCG/ACT IN AERS: 2.0000 | INHALATION_SPRAY | Freq: Four times a day (QID) | RESPIRATORY_TRACT | 3 refills | Status: DC | PRN

## 2021-01-23 NOTE — Telephone Encounter (Signed)
Pt is requesting a call back she is need her PCP to correct her medical records

## 2021-01-23 NOTE — Telephone Encounter (Signed)
RTC to patient, she states she went to the ED earlier this month for shoulder pain and was given hydrocodone, 1 tablet.  She also states she was seen by ortho/rehab and was given pain medicine and a steroid injection.  Pt states she is a recovered narcotic abuser and hasn't used in 29 years.  She states she felt "sick" after receiving the above meds and she does not want to receive any narcotics again.  States after receiving the narcotic, she felt depressed and confused.  Pt congratulated for her success.  RN informed patient she will add hydrocodone as an allergy sensitivity with a notation that she can't take it because she is a recovering addict and she agrees.  RN informed patient to contact ortho/rehab about which pain med was given and express her wishes, she states she has done this already.  RN reiterated that not all clinics are on Epic and each time she see's a physician where RX's are obtained she should mention this to MD, she verbalized understanding. SChaplin, RN,BSN

## 2021-01-23 NOTE — Telephone Encounter (Signed)
Refill Request  Pt states her pharmacy has been trying to reach our office.  Pt states she now only has 1 puff left of the following medication.  albuterol (VENTOLIN HFA) 108 (90 Base) MCG/ACT inhaler  [id number] - [place], [place] AT [facility] OF [address] (Ph: [phone])

## 2021-01-24 ENCOUNTER — Ambulatory Visit: Payer: Medicare Other | Admitting: Physical Therapy

## 2021-01-24 ENCOUNTER — Telehealth: Payer: Self-pay | Admitting: Physical Therapy

## 2021-01-24 NOTE — Telephone Encounter (Signed)
Left message for pt reminding her of missed visit today and attendance policy.  Left reminder of next visit and informed pt we will have to cancel appts following the next visit and begin scheduling 1 visit at a time per attendance policy.  Reiterated that we would keep her next PT appt on 5/12.

## 2021-01-25 ENCOUNTER — Other Ambulatory Visit: Payer: Self-pay

## 2021-01-25 DIAGNOSIS — M25512 Pain in left shoulder: Secondary | ICD-10-CM | POA: Diagnosis not present

## 2021-01-25 DIAGNOSIS — M542 Cervicalgia: Secondary | ICD-10-CM | POA: Diagnosis not present

## 2021-01-25 DIAGNOSIS — J4540 Moderate persistent asthma, uncomplicated: Secondary | ICD-10-CM

## 2021-01-25 MED ORDER — TRELEGY ELLIPTA 200-62.5-25 MCG/INH IN AEPB: 1.0000 | INHALATION_SPRAY | Freq: Every day | RESPIRATORY_TRACT | 2 refills | Status: DC

## 2021-01-25 NOTE — Telephone Encounter (Signed)
Fluticasone-Umeclidin-Vilant (TRELEGY ELLIPTA) 200-62.5-25 MCG/INH AEPB, refill request @  [id number] [address], [address] AT [facility] OF [address] Phone:  [phone]  Fax:  [phone]

## 2021-01-26 ENCOUNTER — Other Ambulatory Visit: Payer: Medicare Other

## 2021-01-26 ENCOUNTER — Ambulatory Visit: Payer: Medicare Other | Admitting: Physical Therapy

## 2021-01-27 ENCOUNTER — Other Ambulatory Visit: Payer: Self-pay

## 2021-01-27 ENCOUNTER — Other Ambulatory Visit: Payer: Medicare Other | Admitting: *Deleted

## 2021-01-27 DIAGNOSIS — M25512 Pain in left shoulder: Secondary | ICD-10-CM | POA: Diagnosis not present

## 2021-01-27 DIAGNOSIS — Z79899 Other long term (current) drug therapy: Secondary | ICD-10-CM | POA: Diagnosis not present

## 2021-01-27 DIAGNOSIS — E785 Hyperlipidemia, unspecified: Secondary | ICD-10-CM | POA: Diagnosis not present

## 2021-01-28 LAB — HEPATIC FUNCTION PANEL
ALT: 22 IU/L (ref 0–32)
AST: 19 IU/L (ref 0–40)
Albumin: 4.6 g/dL (ref 3.8–4.8)
Alkaline Phosphatase: 66 IU/L (ref 44–121)
Bilirubin Total: 0.4 mg/dL (ref 0.0–1.2)
Bilirubin, Direct: 0.13 mg/dL (ref 0.00–0.40)
Total Protein: 6.1 g/dL (ref 6.0–8.5)

## 2021-01-28 LAB — LIPID PANEL
Chol/HDL Ratio: 2.1 ratio (ref 0.0–4.4)
Cholesterol, Total: 226 mg/dL — ABNORMAL HIGH (ref 100–199)
HDL: 108 mg/dL (ref >39)
LDL Chol Calc (NIH): 108 mg/dL — ABNORMAL HIGH (ref 0–99)
Triglycerides: 58 mg/dL (ref 0–149)
VLDL Cholesterol Cal: 10 mg/dL (ref 5–40)

## 2021-01-30 ENCOUNTER — Telehealth: Payer: Self-pay | Admitting: Cardiology

## 2021-01-30 ENCOUNTER — Other Ambulatory Visit: Payer: Self-pay

## 2021-01-30 MED ORDER — ATORVASTATIN CALCIUM 10 MG PO TABS: 10.0000 mg | ORAL_TABLET | Freq: Every day | ORAL | 3 refills | Status: DC

## 2021-01-30 MED ORDER — ATORVASTATIN CALCIUM 10 MG PO TABS: 10.0000 mg | ORAL_TABLET | Freq: Every day | ORAL | 1 refills | Status: DC

## 2021-01-30 NOTE — Telephone Encounter (Signed)
PT is calling back with additional questions about the test results she was given today.

## 2021-01-30 NOTE — Telephone Encounter (Signed)
Spoke with patient after she called again related to her elevation in LDL's.  Patient doesn't understand why her cholesterol has gone up.  She states, "i'm not eating red meat, I'm walking every day".  Patient states she was fasting before the last lab result.  Educated that sometimes a medication is necessary to help lower the cholesterol.  On previous conversation, patient agreed to take atorvastatin 10 mg daily that Dr. Johney Frame had suggested.   Advised I would route this note to Dr. Johney Frame and her nurse for further follow-up.

## 2021-01-30 NOTE — Progress Notes (Signed)
Spoke with patient about elevated LDL's and discussed goal is 70 or below.  Patient has not been taking her Crestor due to side effects.  Patient is agreeable to taking atorvastatin 10mg  daily as recommended in Dr. Jacolyn Reedy note.

## 2021-01-30 NOTE — Addendum Note (Signed)
Addended by: Willeen Cass on: 01/30/2021 12:05 PM   Modules accepted: Orders

## 2021-01-31 ENCOUNTER — Encounter: Payer: Medicare Other | Admitting: Physical Therapy

## 2021-02-01 DIAGNOSIS — M6281 Muscle weakness (generalized): Secondary | ICD-10-CM | POA: Diagnosis not present

## 2021-02-01 DIAGNOSIS — S46012D Strain of muscle(s) and tendon(s) of the rotator cuff of left shoulder, subsequent encounter: Secondary | ICD-10-CM | POA: Diagnosis not present

## 2021-02-01 DIAGNOSIS — M25612 Stiffness of left shoulder, not elsewhere classified: Secondary | ICD-10-CM | POA: Diagnosis not present

## 2021-02-01 DIAGNOSIS — M25512 Pain in left shoulder: Secondary | ICD-10-CM | POA: Diagnosis not present

## 2021-02-02 ENCOUNTER — Telehealth: Payer: Self-pay | Admitting: Dietician

## 2021-02-02 NOTE — Telephone Encounter (Signed)
Called patient and advised we are waiting on referral and encouraged she is doing the right things.

## 2021-02-02 NOTE — Telephone Encounter (Signed)
Aimee Davis leaves a message asking for an appointment to help her with her high cholesterol. I do not see a diagnosis for this on her problem list. If she does have high cholesterol and this diagnosis is added, please enter a referral for Medical Nutrition Therapy for high cholesterol.

## 2021-02-03 ENCOUNTER — Telehealth: Payer: Self-pay | Admitting: Physician Assistant

## 2021-02-03 MED ORDER — ROSUVASTATIN CALCIUM 5 MG PO TABS: 5.0000 mg | ORAL_TABLET | Freq: Every day | ORAL | 0 refills | Status: DC

## 2021-02-03 NOTE — Telephone Encounter (Signed)
Want to try crestor 5mg  daily and see how she does?

## 2021-02-03 NOTE — Telephone Encounter (Signed)
PT is calling in with concerns she is having muscle spasms.Pt has not been able to sleep and she has irritable bowels.Pt would like a call back to find out what can she take to stop this from continuing to happen.

## 2021-02-03 NOTE — Telephone Encounter (Signed)
Spoke with the pt with Dr. Jacolyn Reedy recommendations for her to stop atorvastatin and try to start Crestor 5 mg po daily, if she is ok with seeing how this does.  Pt agreed to switch.  Confirmed the pharmacy of choice with the pt. Informed her I will only send in a month supply of her crestor and refill accordingly thereafter, based on tolerance of this switch.  Advised the pt to keep her appt with Richardson Dopp PA-C on 6/10.  Advised her to stop taking atorvastatin.  Updated atorvastatin in pts allergies as an intolerance.  Pt verbalized understanding and agrees with this plan. Pt was more than gracious for all the assistance provided.

## 2021-02-03 NOTE — Telephone Encounter (Signed)
Pt is calling to report to Dr. Johney Frame that she cannot tolerate taking atorvastatin anymore.  Pt states she wants to be switched to a more tolerable regimen, for she states "I am working very hard on getting my cholesterol better." Pt states since starting atorvastatin, she has developed muscle cramps in her lower extremities, poor appetite, trouble sleeping, and it has caused her extreme upset stomach, worsening her IBS.  Pt would like for Dr. Johney Frame to advise on this matter.  Advised the pt to go ahead and stop taking atorvastatin.  Informed her I will route her concerns to Dr. Johney Frame and Pharmacist in lipid clinic, for further review and recommendation. Informed the pt that I will follow-up accordingly thereafter. Pt verbalized understanding and agrees with this plan.

## 2021-02-07 ENCOUNTER — Encounter: Payer: Medicare Other | Admitting: Physical Therapy

## 2021-02-07 DIAGNOSIS — M25612 Stiffness of left shoulder, not elsewhere classified: Secondary | ICD-10-CM | POA: Diagnosis not present

## 2021-02-07 DIAGNOSIS — M6281 Muscle weakness (generalized): Secondary | ICD-10-CM | POA: Diagnosis not present

## 2021-02-07 DIAGNOSIS — M25512 Pain in left shoulder: Secondary | ICD-10-CM | POA: Diagnosis not present

## 2021-02-07 DIAGNOSIS — S46012D Strain of muscle(s) and tendon(s) of the rotator cuff of left shoulder, subsequent encounter: Secondary | ICD-10-CM | POA: Diagnosis not present

## 2021-02-09 DIAGNOSIS — M25512 Pain in left shoulder: Secondary | ICD-10-CM | POA: Diagnosis not present

## 2021-02-09 DIAGNOSIS — S46012D Strain of muscle(s) and tendon(s) of the rotator cuff of left shoulder, subsequent encounter: Secondary | ICD-10-CM | POA: Diagnosis not present

## 2021-02-09 DIAGNOSIS — M6281 Muscle weakness (generalized): Secondary | ICD-10-CM | POA: Diagnosis not present

## 2021-02-09 DIAGNOSIS — M25612 Stiffness of left shoulder, not elsewhere classified: Secondary | ICD-10-CM | POA: Diagnosis not present

## 2021-02-14 DIAGNOSIS — S46012D Strain of muscle(s) and tendon(s) of the rotator cuff of left shoulder, subsequent encounter: Secondary | ICD-10-CM | POA: Diagnosis not present

## 2021-02-14 DIAGNOSIS — M25512 Pain in left shoulder: Secondary | ICD-10-CM | POA: Diagnosis not present

## 2021-02-14 DIAGNOSIS — M25612 Stiffness of left shoulder, not elsewhere classified: Secondary | ICD-10-CM | POA: Diagnosis not present

## 2021-02-14 DIAGNOSIS — M6281 Muscle weakness (generalized): Secondary | ICD-10-CM | POA: Diagnosis not present

## 2021-02-16 DIAGNOSIS — M6281 Muscle weakness (generalized): Secondary | ICD-10-CM | POA: Diagnosis not present

## 2021-02-16 DIAGNOSIS — S46012D Strain of muscle(s) and tendon(s) of the rotator cuff of left shoulder, subsequent encounter: Secondary | ICD-10-CM | POA: Diagnosis not present

## 2021-02-16 DIAGNOSIS — M25512 Pain in left shoulder: Secondary | ICD-10-CM | POA: Diagnosis not present

## 2021-02-16 DIAGNOSIS — M25612 Stiffness of left shoulder, not elsewhere classified: Secondary | ICD-10-CM | POA: Diagnosis not present

## 2021-02-21 DIAGNOSIS — M25612 Stiffness of left shoulder, not elsewhere classified: Secondary | ICD-10-CM | POA: Diagnosis not present

## 2021-02-21 DIAGNOSIS — M2392 Unspecified internal derangement of left knee: Secondary | ICD-10-CM | POA: Diagnosis not present

## 2021-02-21 DIAGNOSIS — M25512 Pain in left shoulder: Secondary | ICD-10-CM | POA: Diagnosis not present

## 2021-02-21 DIAGNOSIS — M6281 Muscle weakness (generalized): Secondary | ICD-10-CM | POA: Diagnosis not present

## 2021-02-23 DIAGNOSIS — M6281 Muscle weakness (generalized): Secondary | ICD-10-CM | POA: Diagnosis not present

## 2021-02-23 DIAGNOSIS — I700 Atherosclerosis of aorta: Secondary | ICD-10-CM

## 2021-02-23 DIAGNOSIS — E7800 Pure hypercholesterolemia, unspecified: Secondary | ICD-10-CM | POA: Insufficient documentation

## 2021-02-23 DIAGNOSIS — I2510 Atherosclerotic heart disease of native coronary artery without angina pectoris: Secondary | ICD-10-CM | POA: Insufficient documentation

## 2021-02-23 DIAGNOSIS — M75101 Unspecified rotator cuff tear or rupture of right shoulder, not specified as traumatic: Secondary | ICD-10-CM | POA: Diagnosis not present

## 2021-02-23 DIAGNOSIS — M25512 Pain in left shoulder: Secondary | ICD-10-CM | POA: Diagnosis not present

## 2021-02-23 DIAGNOSIS — M25612 Stiffness of left shoulder, not elsewhere classified: Secondary | ICD-10-CM | POA: Diagnosis not present

## 2021-02-23 HISTORY — DX: Atherosclerosis of aorta: I70.0

## 2021-02-23 NOTE — Progress Notes (Signed)
Cardiology Office Note:    Date:  02/24/2021   ID:  DICKIE LABARRE, DOB 1956/07/19, MRN [id number]  PCP:  Lacinda Axon, MD   [facility] Providers Cardiologist:  Freada Bergeron, MD      Referring MD: Lacinda Axon, MD   Chief Complaint:  Follow-up (CAD)    Patient Profile:    Aimee Davis is a 65 y.o. female with:  Coronary artery disease  Ca2+ score 90; min non-obs CAD in RCA (CTA 3/22) Chronic fatigue GERD  Asthma  COVID-19 in 02/2019 FHx of CAD  Hyperlipidemia  Intol of Atorvastatin  Aortic atherosclerosis on CT in 3/22   Prior CV studies:  Echocardiogram 12/20/2020 EF 60-65, no RWMA, normal diastolic function, normal RVSF, normal pulmonary artery systolic pressure, trivial MR  CT CORONARY MORPH W/CTA COR W/SCORE W/CA W/CM &/OR WO/CM 12/07/2020 Aorta: Normal size. Mild diffuse atherosclerotic plaque. No dissection. Coronary Arteries:  Normal coronary origin.  Right dominance. RCA is a very large dominant artery that gives rise to PDA and PLA. There is mild calcified atherosclerotic plaque in the proximal RCA with stenosis <25%, mid and distal RVA have minimal stenoses. PDA and PLA have no significant plaque. Left main is a large artery that gives rise to LAD and LCX arteries.  Left main has no plaque. LAD is a large vessel that gives rise to one large diagonal artery and has no plaque.  D1 has no plaque. LCX is a non-dominant artery that gives rise to one large OM1 branch. There is no plaque.  IMPRESSION: 1. Coronary calcium score of 90. This was 34 percentile for age and sex matched control. 2. Normal coronary origin with right dominance. 3. CAD-RADS 1. Minimal non-obstructive CAD (0-24%). Consider non-atherosclerotic causes of chest pain. Consider preventive therapy and risk factor modification.    History of Present Illness: [name] was evaluated by Dr. Johney Frame in 3/22 for symptoms of exertional chest pain and shortness of  breath.  Coronary CTA was obtained and demonstrated a calcium score of 90 with minimal non-obstructive CAD.   Echocardiogram demonstrated normal EF.  She returns for f/u.   She is here alone.  Overall, she is doing well without chest pain, shortness of breath, syncope.  She sometimes gets lightheaded in the mornings.  She also has muscle cramps in her legs.    Past Medical History:  Diagnosis Date   Allergy    seasonal, PCN, antidepressants, bentyl   Arthritis    Asthma    Carpal tunnel syndrome on both sides    right worse than left 2008   Cataract 06/11/2013   COPD (chronic obstructive pulmonary disease) ([place])    COVID-19 virus infection 06/18/2016   Eczema 05/02/2018   GERD (gastroesophageal reflux disease)    Hyperlipidemia    LDL 108 JULY 2013   IBS (irritable bowel syndrome)    Multiple nevi 03/01/2015   Pneumonia    Positive TB test    From MArch 2009 note : Recent F/u at [facility]. CXRAy negative.  Positive TST in 2007 (22m).  Unable to tolerate INH due to hepatotoxicity    Current Medications: Current Meds  Medication Sig   albuterol (VENTOLIN HFA) 108 (90 Base) MCG/ACT inhaler Inhale 2 puffs into the lungs every 6 (six) hours as needed for wheezing or shortness of breath.   Artificial Tear Ointment (DRY EYES OP) Apply 1 drop to eye daily as needed (dry eyes).   Ascorbic Acid (VITAMIN C) 100 MG tablet Take  50 mg by mouth daily.   aspirin EC 81 MG tablet Take 1 tablet (81 mg total) by mouth daily. Swallow whole.   B Complex-C (B-COMPLEX WITH VITAMIN C) tablet Take 1 tablet by mouth daily.   cetirizine (ZYRTEC) 10 MG tablet Take 10 mg by mouth daily.   Cholecalciferol (VITAMIN D) 125 MCG (5000 UT) CAPS Take 1-2 tablets by mouth daily.   Fluticasone-Umeclidin-Vilant (TRELEGY ELLIPTA) 200-62.5-25 MCG/INH AEPB Inhale 1 puff into the lungs daily.   Magnesium 400 MG TABS Take 2 each by mouth daily.   metoprolol tartrate (LOPRESSOR) 25 MG tablet Patient to take 1 tablet by mouth 2 hours  prior to procedure   mometasone (NASONEX) 50 MCG/ACT nasal spray PLACE 2 SPRAYS IN EACH NOSTRIL ONCE DAILY AS NEEDED.   omeprazole (PRILOSEC) 40 MG capsule Take 1 capsule (40 mg total) by mouth daily.   ondansetron (ZOFRAN ODT) 4 MG disintegrating tablet Take 1 tablet (4 mg total) by mouth every 8 (eight) hours as needed for nausea or vomiting.   OVER THE COUNTER MEDICATION MICROTEX VM - FOOD NUTRIENT COMPLEX  2 DAY   OVER THE COUNTER MEDICATION EO MEGA Essential Oil - 2 daily   Peppermint Oil (IBGARD) 90 MG CPCR Take 1 capsule by mouth 2 (two) times daily as needed. Take as directed as needed   polyethylene glycol (MIRALAX) 17 g packet Take 17 g by mouth daily.   Probiotic Product (PROBIOTIC DAILY PO) Take by mouth.   rosuvastatin (CRESTOR) 5 MG tablet Take 1 tablet (5 mg total) by mouth daily.   simethicone (GAS-X) 80 MG chewable tablet Use as directed as needed   sucralfate (CARAFATE) 1 g tablet Take 1 tablet (1 g total) by mouth every 6 (six) hours as needed.   VALIUM 5 MG tablet SMARTSIG:1 Tablet(s) By Mouth     Allergies:   Atorvastatin, Hydrocodone, Penicillins, and Levsin [hyoscyamine]   Social History   Tobacco Use   Smoking status: Former    Packs/day: 0.33    Years: 43.00    Pack years: 14.19    Types: Cigarettes    Quit date: 01/06/2013    Years since quitting: 8.1   Smokeless tobacco: Never  Vaping Use   Vaping Use: Never used  Substance Use Topics   Alcohol use: No    Alcohol/week: 0.0 standard drinks   Drug use: Not Currently    Comment: x 26 yrs.     Family Hx: The patient's family history includes Breast cancer in her sister and sister; Cancer in her sister; Diabetes in her father, mother, and sister; Heart disease in her father and mother; Hyperlipidemia in her father; Hypertension in her father, maternal aunt, maternal uncle, mother, paternal aunt, paternal uncle, and sister; Kidney disease in her father, mother, paternal aunt, paternal uncle, and sister;  Mental illness in her brother; Stomach cancer in her maternal uncle; Stroke in her father. There is no history of Colon cancer, Esophageal cancer, or Pancreatic cancer.  ROS   EKGs/Labs/Other Test Reviewed:    EKG:  EKG is not ordered today.  The ekg ordered today demonstrates n/a  Recent Labs: 11/07/2020: BUN 17; Hemoglobin 13.3; Platelets 290; Potassium 5.0; Sodium 141; TSH 1.840 12/07/2020: Creatinine, Ser 0.90 01/27/2021: ALT 22   Recent Lipid Panel Lab Results  Component Value Date/Time   CHOL 226 (H) 01/27/2021 10:44 AM   TRIG 58 01/27/2021 10:44 AM   HDL 108 01/27/2021 10:44 AM   LDLCALC 108 (H) 01/27/2021 10:44 AM  Risk Assessment/Calculations:      Physical Exam:    VS:  BP 102/60   Pulse 74   Ht _0  (1.6 m)   Wt 149 lb 9.6 oz (67.9 kg)   SpO2 97%   BMI 26.50 kg/m     Wt Readings from Last 3 Encounters:  02/24/21 149 lb 9.6 oz (67.9 kg)  01/15/21 155 lb (70.3 kg)  12/27/20 155 lb (70.3 kg)     Constitutional:      Appearance: Healthy appearance. Not in distress.  Neck:     Vascular: No JVR. JVD normal.  Pulmonary:     Effort: Pulmonary effort is normal.     Breath sounds: No wheezing. No rales.  Cardiovascular:     Normal rate. Regular rhythm. Normal S1. Normal S2.      Murmurs: There is no murmur.  Pulses:    Intact distal pulses.  Edema:    Peripheral edema absent.  Abdominal:     Palpations: Abdomen is soft.  Skin:    General: Skin is warm and dry.  Neurological:     Mental Status: Alert and oriented to person, place and time.     Cranial Nerves: Cranial nerves are intact.         ASSESSMENT & PLAN:    1. Coronary artery disease involving native coronary artery of native heart without angina pectoris Recent coronary CTA with calcium score 90 placing her in the 83rd percentile for age and sex matched control.  She had minimal nonobstructive plaque in the RCA.  She is doing well without anginal symptoms.  I recommended she start  aspirin 81 mg daily.  Continue statin therapy.  Follow-up with Dr. Johney Frame in 6 months.  2. Pure hypercholesterolemia Recent LDL above goal.  She was not taking rosuvastatin due to side effects.  She was unable to tolerate atorvastatin in the past.  She is now on rosuvastatin 5 mg daily.  Obtain fasting lipids and comprehensive metabolic panel in 6 to 8 weeks.  If LDL remains above goal, consider adding ezetimibe.  If she has difficulty tolerating statins, we could refer to the pharmacy clinic to see if she is a candidate for bempedoic acid or PCSK9 inhibitor therapy or include Inclisiran.   3. Aortic atherosclerosis ([place]) Noted on CT. Start ASA 81 mg once daily.  Continue statin Rx.        Dispo:  Return in about 6 months (around 08/26/2021) for Routine Follow Up, w/ Dr. Johney Frame.   Medication Adjustments/Labs and Tests Ordered: Current medicines are reviewed at length with the patient today.  Concerns regarding medicines are outlined above.  Tests Ordered: Orders Placed This Encounter  Procedures   Comp Met (CMET)   Lipid Profile    Medication Changes: Meds ordered this encounter  Medications   aspirin EC 81 MG tablet    Sig: Take 1 tablet (81 mg total) by mouth daily. Swallow whole.    Order Specific Question:   Supervising Provider    Answer:   Lelon Perla [[id number]]    Signed, Richardson Dopp, PA-C  02/24/2021 11:07 AM    [facility] [address], [place], [place]  [address] Phone: [phone]; Fax: [phone]

## 2021-02-24 ENCOUNTER — Other Ambulatory Visit: Payer: Self-pay

## 2021-02-24 ENCOUNTER — Encounter: Payer: Self-pay | Admitting: Physician Assistant

## 2021-02-24 ENCOUNTER — Other Ambulatory Visit: Payer: Medicare Other | Admitting: Physician Assistant

## 2021-02-24 ENCOUNTER — Ambulatory Visit (INDEPENDENT_AMBULATORY_CARE_PROVIDER_SITE_OTHER): Payer: Medicare Other | Admitting: Physician Assistant

## 2021-02-24 VITALS — BP 102/60 | HR 74 | Ht 63.0 in | Wt 149.6 lb

## 2021-02-24 DIAGNOSIS — E7800 Pure hypercholesterolemia, unspecified: Secondary | ICD-10-CM

## 2021-02-24 DIAGNOSIS — I2510 Atherosclerotic heart disease of native coronary artery without angina pectoris: Secondary | ICD-10-CM

## 2021-02-24 DIAGNOSIS — I700 Atherosclerosis of aorta: Secondary | ICD-10-CM

## 2021-02-24 MED ORDER — ASPIRIN EC 81 MG PO TBEC: 81.0000 mg | DELAYED_RELEASE_TABLET | Freq: Every day | ORAL | Status: DC

## 2021-02-24 NOTE — Patient Instructions (Addendum)
Medication Instructions:  Start taking an aspirin 81 mg ("baby aspirin") daily  *If you need a refill on your cardiac medications before your next appointment, please call your pharmacy*   Lab Work: Fasting CMET, lipids in 6-8 weeks on Wednesday, July 20 between 7:30 -4:30, fasting from midnight the night before.   If you have labs (blood work) drawn today and your tests are completely normal, you will receive your results only by: [place] (if you have MyChart) OR A paper copy in the mail If you have any lab test that is abnormal or we need to change your treatment, we will call you to review the results.   Follow-Up: At [facility], you and your health needs are our priority.  As part of our continuing mission to provide you with exceptional heart care, we have created designated Provider Care Teams.  These Care Teams include your primary Cardiologist (physician) and Advanced Practice Providers (APPs -  Physician Assistants and Nurse Practitioners) who all work together to provide you with the care you need, when you need it.  We recommend signing up for the patient portal called "MyChart".  Sign up information is provided on this After Visit Summary.  MyChart is used to connect with patients for Virtual Visits (Telemedicine).  Patients are able to view lab/test results, encounter notes, upcoming appointments, etc.  Non-urgent messages can be sent to your provider as well.   To learn more about what you can do with MyChart, go to [email or web address].    Your next appointment:   6 month(s) on Tuesday, December 13 @ 9:00 am   The format for your next appointment:   In Person  Provider:   Gwyndolyn Kaufman, MD   Other Instructions

## 2021-03-02 ENCOUNTER — Other Ambulatory Visit: Payer: Self-pay | Admitting: *Deleted

## 2021-03-02 MED ORDER — ROSUVASTATIN CALCIUM 5 MG PO TABS: 5.0000 mg | ORAL_TABLET | Freq: Every day | ORAL | 1 refills | Status: DC

## 2021-03-07 DIAGNOSIS — S46012D Strain of muscle(s) and tendon(s) of the rotator cuff of left shoulder, subsequent encounter: Secondary | ICD-10-CM | POA: Diagnosis not present

## 2021-03-07 DIAGNOSIS — M25512 Pain in left shoulder: Secondary | ICD-10-CM | POA: Diagnosis not present

## 2021-03-07 DIAGNOSIS — M25612 Stiffness of left shoulder, not elsewhere classified: Secondary | ICD-10-CM | POA: Diagnosis not present

## 2021-03-07 DIAGNOSIS — M6281 Muscle weakness (generalized): Secondary | ICD-10-CM | POA: Diagnosis not present

## 2021-03-10 DIAGNOSIS — M6281 Muscle weakness (generalized): Secondary | ICD-10-CM | POA: Diagnosis not present

## 2021-03-10 DIAGNOSIS — M25512 Pain in left shoulder: Secondary | ICD-10-CM | POA: Diagnosis not present

## 2021-03-10 DIAGNOSIS — S46012D Strain of muscle(s) and tendon(s) of the rotator cuff of left shoulder, subsequent encounter: Secondary | ICD-10-CM | POA: Diagnosis not present

## 2021-03-10 DIAGNOSIS — M25612 Stiffness of left shoulder, not elsewhere classified: Secondary | ICD-10-CM | POA: Diagnosis not present

## 2021-03-13 DIAGNOSIS — S46012D Strain of muscle(s) and tendon(s) of the rotator cuff of left shoulder, subsequent encounter: Secondary | ICD-10-CM | POA: Diagnosis not present

## 2021-03-13 DIAGNOSIS — M6281 Muscle weakness (generalized): Secondary | ICD-10-CM | POA: Diagnosis not present

## 2021-03-13 DIAGNOSIS — M25512 Pain in left shoulder: Secondary | ICD-10-CM | POA: Diagnosis not present

## 2021-03-13 DIAGNOSIS — M25612 Stiffness of left shoulder, not elsewhere classified: Secondary | ICD-10-CM | POA: Diagnosis not present

## 2021-03-14 DIAGNOSIS — M25512 Pain in left shoulder: Secondary | ICD-10-CM | POA: Diagnosis not present

## 2021-03-15 DIAGNOSIS — M6281 Muscle weakness (generalized): Secondary | ICD-10-CM | POA: Diagnosis not present

## 2021-03-15 DIAGNOSIS — M25512 Pain in left shoulder: Secondary | ICD-10-CM | POA: Diagnosis not present

## 2021-03-15 DIAGNOSIS — M25612 Stiffness of left shoulder, not elsewhere classified: Secondary | ICD-10-CM | POA: Diagnosis not present

## 2021-03-15 DIAGNOSIS — S46012D Strain of muscle(s) and tendon(s) of the rotator cuff of left shoulder, subsequent encounter: Secondary | ICD-10-CM | POA: Diagnosis not present

## 2021-03-26 ENCOUNTER — Encounter (HOSPITAL_COMMUNITY): Payer: Self-pay | Admitting: Emergency Medicine

## 2021-03-26 ENCOUNTER — Other Ambulatory Visit: Payer: Self-pay

## 2021-03-26 ENCOUNTER — Emergency Department (HOSPITAL_COMMUNITY): Payer: Medicare Other

## 2021-03-26 ENCOUNTER — Emergency Department (HOSPITAL_COMMUNITY)
Admission: EM | Admit: 2021-03-26 | Discharge: 2021-03-26 | Disposition: A | Discharge status: Home / Self Care | Payer: Medicare Other | Attending: Emergency Medicine | Admitting: Emergency Medicine

## 2021-03-26 DIAGNOSIS — R630 Anorexia: Secondary | ICD-10-CM | POA: Diagnosis not present

## 2021-03-26 DIAGNOSIS — I2510 Atherosclerotic heart disease of native coronary artery without angina pectoris: Secondary | ICD-10-CM | POA: Diagnosis not present

## 2021-03-26 DIAGNOSIS — R110 Nausea: Secondary | ICD-10-CM | POA: Insufficient documentation

## 2021-03-26 DIAGNOSIS — Z87891 Personal history of nicotine dependence: Secondary | ICD-10-CM | POA: Insufficient documentation

## 2021-03-26 DIAGNOSIS — Z8616 Personal history of COVID-19: Secondary | ICD-10-CM | POA: Insufficient documentation

## 2021-03-26 DIAGNOSIS — J449 Chronic obstructive pulmonary disease, unspecified: Secondary | ICD-10-CM | POA: Diagnosis not present

## 2021-03-26 DIAGNOSIS — R109 Unspecified abdominal pain: Secondary | ICD-10-CM | POA: Diagnosis not present

## 2021-03-26 DIAGNOSIS — R1084 Generalized abdominal pain: Secondary | ICD-10-CM | POA: Insufficient documentation

## 2021-03-26 DIAGNOSIS — Z7982 Long term (current) use of aspirin: Secondary | ICD-10-CM | POA: Diagnosis not present

## 2021-03-26 DIAGNOSIS — I10 Essential (primary) hypertension: Secondary | ICD-10-CM | POA: Diagnosis not present

## 2021-03-26 DIAGNOSIS — J45909 Unspecified asthma, uncomplicated: Secondary | ICD-10-CM | POA: Diagnosis not present

## 2021-03-26 DIAGNOSIS — R197 Diarrhea, unspecified: Secondary | ICD-10-CM | POA: Diagnosis not present

## 2021-03-26 LAB — BASIC METABOLIC PANEL
Anion gap: 9 (ref 5–15)
BUN: <5 mg/dL — ABNORMAL LOW (ref 8–23)
CO2: 25 mmol/L (ref 22–32)
Calcium: 9.8 mg/dL (ref 8.9–10.3)
Chloride: 100 mmol/L (ref 98–111)
Creatinine, Ser: 0.84 mg/dL (ref 0.44–1.00)
GFR, Estimated: >60 mL/min (ref >60)
Glucose, Bld: 133 mg/dL — ABNORMAL HIGH (ref 70–99)
Potassium: 3.7 mmol/L (ref 3.5–5.1)
Sodium: 134 mmol/L — ABNORMAL LOW (ref 135–145)

## 2021-03-26 LAB — CBC
HCT: 39.8 % (ref 36.0–46.0)
Hemoglobin: 13 g/dL (ref 12.0–15.0)
MCH: 28.2 pg (ref 26.0–34.0)
MCHC: 32.7 g/dL (ref 30.0–36.0)
MCV: 86.3 fL (ref 80.0–100.0)
Platelets: 246 10*3/uL (ref 150–400)
RBC: 4.61 MIL/uL (ref 3.87–5.11)
RDW: 12.9 % (ref 11.5–15.5)
WBC: 2.7 10*3/uL — ABNORMAL LOW (ref 4.0–10.5)
nRBC: 0 % (ref 0.0–0.2)

## 2021-03-26 LAB — URINALYSIS, ROUTINE W REFLEX MICROSCOPIC
Bilirubin Urine: NEGATIVE
Glucose, UA: NEGATIVE mg/dL
Hgb urine dipstick: NEGATIVE
Ketones, ur: NEGATIVE mg/dL
Leukocytes,Ua: NEGATIVE
Nitrite: NEGATIVE
Protein, ur: NEGATIVE mg/dL
Specific Gravity, Urine: 1.001 — ABNORMAL LOW (ref 1.005–1.030)
pH: 6 (ref 5.0–8.0)

## 2021-03-26 MED ORDER — SODIUM CHLORIDE 0.9 % IV BOLUS
500.0000 mL | Freq: Once | INTRAVENOUS | Status: AC
  Administered 2021-03-26: 500 mL via INTRAVENOUS

## 2021-03-26 MED ORDER — SODIUM CHLORIDE 0.9% FLUSH
3.0000 mL | Freq: Once | INTRAVENOUS | Status: AC
  Administered 2021-03-26: 3 mL via INTRAVENOUS

## 2021-03-26 MED ORDER — IOHEXOL 300 MG/ML  SOLN
100.0000 mL | Freq: Once | INTRAMUSCULAR | Status: AC | PRN
  Administered 2021-03-26: 100 mL via INTRAVENOUS

## 2021-03-26 MED ORDER — KETOROLAC TROMETHAMINE 30 MG/ML IJ SOLN
30.0000 mg | Freq: Once | INTRAMUSCULAR | Status: AC
  Administered 2021-03-26: 30 mg via INTRAVENOUS
  Filled 2021-03-26: qty 1

## 2021-03-26 NOTE — ED Notes (Signed)
Pt ambulatory to restroom with steady gait. Unable to provide a UA at this time. Pt verbalized understanding of new time to collect UA

## 2021-03-26 NOTE — ED Notes (Signed)
Pt ambulatory with steady gait to restroom 

## 2021-03-26 NOTE — ED Notes (Signed)
Patient transported to CT 

## 2021-03-26 NOTE — ED Provider Notes (Signed)
[facility] EMERGENCY DEPARTMENT Provider Note   CSN: [id number] Arrival date & time: 03/26/21  0844     History Chief Complaint  Patient presents with   Weakness   Diarrhea    Aimee COLETTA is a 65 y.o. female.  HPI  65 year old female with past medical history of HLD, IBS, COPD presents to the emergency department generalized abdominal discomfort and diarrhea.  Patient states this started 5 days ago.  She has had diarrhea multiple times a day, nonbloody.  She has felt nauseous with decreased appetite but denies any vomiting, fever.  No genitourinary symptoms.  No chest pain or shortness of breath.  Past Medical History:  Diagnosis Date   Allergy    seasonal, PCN, antidepressants, bentyl   Arthritis    Asthma    Carpal tunnel syndrome on both sides    right worse than left 2008   Cataract 06/11/2013   COPD (chronic obstructive pulmonary disease) ([place])    COVID-19 virus infection 06/18/2016   Eczema 05/02/2018   GERD (gastroesophageal reflux disease)    Hyperlipidemia    LDL 108 JULY 2013   IBS (irritable bowel syndrome)    Multiple nevi 03/01/2015   Pneumonia    Positive TB test    From [facility] 2009 note : Recent F/u at [facility] CXRAy negative.  Positive TST in 2007 (43mm).  Unable to tolerate INH due to hepatotoxicity    Patient Active Problem List   Diagnosis Date Noted   Aortic atherosclerosis ([place]) 02/23/2021   Pure hypercholesterolemia 02/23/2021   Coronary artery disease involving native coronary artery of native heart without angina pectoris 02/23/2021   Back muscle spasm 12/21/2020   Chest pressure 11/07/2020   Memory loss of unknown cause 12/08/2019   MDD (major depressive disorder) 12/05/2019   Restless leg syndrome 11/16/2019   Belching 09/08/2019   Abdominal pain, chronic, epigastric 09/08/2019   Hair loss 07/07/2019   Tinnitus 07/07/2019   Macular rash 05/20/2019   Skin macule 04/21/2019   Seborrheic keratosis 04/21/2019   History  of colon polyps 06/24/2018   Constitutional neutropenia ([place]) 02/21/2018   Seasonal allergies 03/29/2017   Chronic fatigue 07/16/2013   Healthcare maintenance 06/11/2013   Primary insomnia 06/05/2012   COPD (chronic obstructive pulmonary disease) ([place]) 03/17/2010   Asthma 01/31/2007   Gastroesophageal reflux disease 11/14/2006   Irritable bowel syndrome 11/14/2006    Past Surgical History:  Procedure Laterality Date   BREAST CYST EXCISION Right 2011   BREAST EXCISIONAL BIOPSY     benign   BREAST EXCISIONAL BIOPSY Right 1990   benign   INDUCED ABORTION  2005   VAGINAL HYSTERECTOMY  1983   partial; for abnormal bleeding     OB History   No obstetric history on file.     Family History  Problem Relation Age of Onset   Hypertension Mother    Heart disease Mother    Diabetes Mother    Kidney disease Mother    Stroke Father    Diabetes Father    Heart disease Father    Hyperlipidemia Father    Hypertension Father    Kidney disease Father    Cancer Sister        breast   Diabetes Sister    Kidney disease Sister    Hypertension Sister    Breast cancer Sister    Mental illness Brother    Hypertension Maternal Aunt    Hypertension Maternal Uncle    Stomach cancer Maternal Uncle  Hypertension Paternal Aunt    Kidney disease Paternal Aunt    Hypertension Paternal Uncle    Kidney disease Paternal Uncle    Breast cancer Sister    Colon cancer Neg Hx    Esophageal cancer Neg Hx    Pancreatic cancer Neg Hx     Social History   Tobacco Use   Smoking status: Former    Packs/day: 0.33    Years: 43.00    Pack years: 14.19    Types: Cigarettes    Quit date: 01/06/2013    Years since quitting: 8.2   Smokeless tobacco: Never  Vaping Use   Vaping Use: Never used  Substance Use Topics   Alcohol use: No    Alcohol/week: 0.0 standard drinks   Drug use: Not Currently    Comment: x 26 yrs.    Home Medications Prior to Admission medications   Medication Sig Start  Date End Date Taking? Authorizing Provider  albuterol (VENTOLIN HFA) 108 (90 Base) MCG/ACT inhaler Inhale 2 puffs into the lungs every 6 (six) hours as needed for wheezing or shortness of breath. 01/23/21  Yes Velna Ochs, MD  Artificial Tear Ointment (DRY EYES OP) Apply 1 drop to eye daily as needed (dry eyes).   Yes [provider]  Ascorbic Acid (VITAMIN C) 100 MG tablet Take 50 mg by mouth daily.   Yes [provider]  aspirin EC 81 MG tablet Take 1 tablet (81 mg total) by mouth daily. Swallow whole. 02/24/21  Yes Weaver, Scott T, PA-C  B Complex-C (B-COMPLEX WITH VITAMIN C) tablet Take 1 tablet by mouth daily.   Yes [provider]  cetirizine (ZYRTEC) 10 MG tablet Take 10 mg by mouth daily.   Yes [provider]  Cholecalciferol (VITAMIN D) 125 MCG (5000 UT) CAPS Take 1-2 tablets by mouth daily.   Yes [provider]  Fluticasone-Umeclidin-Vilant (TRELEGY ELLIPTA) 200-62.5-25 MCG/INH AEPB Inhale 1 puff into the lungs daily. 01/25/21  Yes Asencion Noble, MD  Magnesium 400 MG TABS Take 2 each by mouth daily.   Yes [provider]  mometasone (NASONEX) 50 MCG/ACT nasal spray PLACE 2 SPRAYS IN EACH NOSTRIL ONCE DAILY AS NEEDED. 12/27/20  Yes Marianna Payment, MD  omeprazole (PRILOSEC) 40 MG capsule Take 1 capsule (40 mg total) by mouth daily. 09/15/20  Yes Armbruster, Carlota Raspberry, MD  OVER THE COUNTER MEDICATION EO MEGA Essential Oil - 2 daily   Yes [provider]  Peppermint Oil (IBGARD) 90 MG CPCR Take 1 capsule by mouth 2 (two) times daily as needed. Take as directed as needed 12/07/19  Yes Armbruster, Carlota Raspberry, MD  polyethylene glycol (MIRALAX) 17 g packet Take 17 g by mouth daily. 08/05/20  Yes Armbruster, Carlota Raspberry, MD  Probiotic Product (PROBIOTIC DAILY PO) Take 1 tablet by mouth as needed (IBS flare ups).   Yes [provider]  sucralfate (CARAFATE) 1 g tablet Take 1 tablet (1 g total) by mouth every 6 (six) hours as  needed. 09/15/20  Yes Armbruster, Carlota Raspberry, MD  metoprolol tartrate (LOPRESSOR) 25 MG tablet Patient to take 1 tablet by mouth 2 hours prior to procedure Patient not taking: Reported on 03/26/2021 11/24/20   Freada Bergeron, MD  ondansetron (ZOFRAN ODT) 4 MG disintegrating tablet Take 1 tablet (4 mg total) by mouth every 8 (eight) hours as needed for nausea or vomiting. Patient not taking: Reported on 03/26/2021 09/15/20   Yetta Flock, MD  rosuvastatin (CRESTOR) 5 MG tablet  Take 1 tablet (5 mg total) by mouth daily. Patient not taking: Reported on 03/26/2021 03/02/21   Freada Bergeron, MD  simethicone (GAS-X) 80 MG chewable tablet Use as directed as needed Patient not taking: Reported on 03/26/2021 12/07/19   Yetta Flock, MD    Allergies    Atorvastatin, Hydrocodone, Penicillins, and Levsin [hyoscyamine]  Review of Systems   Review of Systems  Constitutional:  Positive for appetite change and chills. Negative for fever.  HENT:  Negative for congestion.   Eyes:  Negative for visual disturbance.  Respiratory:  Negative for shortness of breath.   Cardiovascular:  Negative for chest pain.  Gastrointestinal:  Positive for abdominal pain, diarrhea and nausea. Negative for blood in stool and vomiting.  Genitourinary:  Negative for dysuria.  Skin:  Negative for rash.  Neurological:  Negative for headaches.   Physical Exam Updated Vital Signs BP 131/66   Pulse 66   Temp 98.6 F (37 C) (Oral)   Resp 15   Ht 5\' 3"  (1.6 m)   Wt 70.3 kg   SpO2 99%   BMI 27.46 kg/m   Physical Exam Vitals and nursing note reviewed.  Constitutional:      Appearance: Normal appearance.  HENT:     Head: Normocephalic.     Mouth/Throat:     Mouth: Mucous membranes are moist.  Cardiovascular:     Rate and Rhythm: Normal rate.  Pulmonary:     Effort: Pulmonary effort is normal. No respiratory distress.  Abdominal:     Palpations: Abdomen is soft.     Tenderness: There is abdominal  tenderness.     Comments: TTP of lower abdomen with out guarding  Skin:    General: Skin is warm.  Neurological:     Mental Status: She is alert and oriented to person, place, and time. Mental status is at baseline.  Psychiatric:        Mood and Affect: Mood normal.    ED Results / Procedures / Treatments   Labs (all labs ordered are listed, but only abnormal results are displayed) Labs Reviewed  BASIC METABOLIC PANEL - Abnormal; Notable for the following components:      Result Value   Sodium 134 (*)    Glucose, Bld 133 (*)    BUN <5 (*)    All other components within normal limits  CBC - Abnormal; Notable for the following components:   WBC 2.7 (*)    All other components within normal limits  URINALYSIS, ROUTINE W REFLEX MICROSCOPIC - Abnormal; Notable for the following components:   Color, Urine STRAW (*)    Specific Gravity, Urine 1.001 (*)    All other components within normal limits  CBG MONITORING, ED    EKG EKG Interpretation  Date/Time:  Sunday March 26 2021 08:53:30 EDT Ventricular Rate:  82 PR Interval:  148 QRS Duration: 86 QT Interval:  352 QTC Calculation: 411 R Axis:   51 Text Interpretation: Normal sinus rhythm with sinus arrhythmia Right atrial enlargement Borderline ECG Similar to previous Confirmed by Lavenia Atlas ([id number]) on 03/26/2021 10:22:01 AM  Radiology No results found.  Procedures Procedures   Medications Ordered in ED Medications  sodium chloride flush (NS) 0.9 % injection 3 mL (has no administration in time range)    ED Course  I have reviewed the triage vital signs and the nursing notes.  Pertinent labs & imaging results that were available during my care of the patient were reviewed by me and  considered in my medical decision making (see chart for details).    MDM Rules/Calculators/A&P                          65 year old female presents emergency department nonbloody diarrhea.  Abdomen is benign.  Vitals are normal and  stable.  Blood work is baseline.  CAT scan identifies no acute findings.  After fluids medication patient feels significantly improved, she is been able to p.o., no diarrhea while here in the department.  Patient will be discharged and treated as an outpatient.  Discharge plan and strict return to ED precautions discussed, patient verbalizes understanding and agreement.  Final Clinical Impression(s) / ED Diagnoses Final diagnoses:  None    Rx / DC Orders ED Discharge Orders     None        Lorelle Gibbs, DO 03/26/21 1543

## 2021-03-26 NOTE — Discharge Instructions (Addendum)
You have been seen and discharged from the emergency department.  Take Lomotil as needed, follow a bland/high fiber diet, stay well hydrated. Follow-up with your primary provider for reevaluation and further care.  Of note your CAT scan did show a small area in the pancreas that needs to be followed up as an outpatient with repeat imaging.  Take home medications as prescribed. If you have any worsening symptoms or further concerns for your health please return to an emergency department for further evaluation.

## 2021-03-26 NOTE — ED Triage Notes (Signed)
Pt reports generalized weakness, nausea, diarrhea, and chills since Tuesday.  Denies pain.

## 2021-03-26 NOTE — ED Notes (Signed)
Up to b/r, steady gait, alert, NAD, calm, interactive.  

## 2021-03-29 ENCOUNTER — Telehealth: Payer: Self-pay

## 2021-03-29 NOTE — Telephone Encounter (Signed)
Requesting to speak with a nurse about not having no energy. Please call pt back.

## 2021-03-29 NOTE — Telephone Encounter (Signed)
Return pt's call. States she went to the ED 03/26/21 for diarrhea; given fluids. States she has no energy; very upset, "feels like I cannot take care of myself", she lives alone. "I feel drained". Diarrhea has resolved. I asked if she has been eating and drinking; states she has been drinking water and propel; and making herself eat like oatmeal , toast w/peanut butter. States she does not have the energy to get up to fix something to eat. Instructed pt to be careful not to fall; states she understand. I asked if she has family who can check on her- stated she does but they work but they have called her. Pt already has an appt schedule tomorrow am @ 0845 AM - informed to keep the appt which she will. Also made aware if she feels worse to call 911 or go to the ED - pt understands.

## 2021-03-30 ENCOUNTER — Telehealth: Payer: Self-pay | Admitting: Dietician

## 2021-03-30 ENCOUNTER — Other Ambulatory Visit: Payer: Self-pay

## 2021-03-30 ENCOUNTER — Encounter: Payer: Self-pay | Admitting: Internal Medicine

## 2021-03-30 ENCOUNTER — Ambulatory Visit (INDEPENDENT_AMBULATORY_CARE_PROVIDER_SITE_OTHER): Payer: Medicare Other | Admitting: Internal Medicine

## 2021-03-30 DIAGNOSIS — A059 Bacterial foodborne intoxication, unspecified: Secondary | ICD-10-CM | POA: Diagnosis not present

## 2021-03-30 DIAGNOSIS — K589 Irritable bowel syndrome without diarrhea: Secondary | ICD-10-CM | POA: Diagnosis not present

## 2021-03-30 DIAGNOSIS — Z0000 Encounter for general adult medical examination without abnormal findings: Secondary | ICD-10-CM

## 2021-03-30 DIAGNOSIS — E7800 Pure hypercholesterolemia, unspecified: Secondary | ICD-10-CM | POA: Diagnosis not present

## 2021-03-30 DIAGNOSIS — K219 Gastro-esophageal reflux disease without esophagitis: Secondary | ICD-10-CM

## 2021-03-30 DIAGNOSIS — J4540 Moderate persistent asthma, uncomplicated: Secondary | ICD-10-CM

## 2021-03-30 NOTE — Telephone Encounter (Signed)
Aimee Davis left a voicemail for a return call. She is scheduled for a visit with a doctor this am.

## 2021-03-30 NOTE — Patient Instructions (Addendum)
Aimee Davis, it was a pleasure seeing you today!  Today we discussed: ED follow up: You had a stomach bug that you went to the ED for, diarrhea has gone but you still have fatigue. I believe that is due to not enough nutrition as you endorse not eating a good meal since July 4th. I have scheduled a meeting with our dietitian and she will see you today to make recommendation. Continue drinking at least 3 L of water to stay hydrated.  Asthma/COPD: Your asthma is well controlled. Continue your previous medications. High Cholesterol: You had elevated cholesterol number. You stopped the medication prescribed by the cardiologist due to some leg cramps. You said you will call them to adjust that.  IBS: You said your symptoms were well controlled with IBGuard. You will benefit from adding fiber to your diet. I would recommend adding a fiber tablet that can be obtained from [place] or [place].  I have ordered the following labs today:  Lab Orders  No laboratory test(s) ordered today      Referrals ordered today:   Referral Orders  No referral(s) requested today     I have ordered the following medication/changed the following medications:   Stop the following medications: There are no discontinued medications.   Start the following medications: No orders of the defined types were placed in this encounter.    Follow-up: 1-2 weeks if symptoms persist otherwise 3 months.   Please make sure to arrive 15 minutes prior to your next appointment. If you arrive late, you may be asked to reschedule.   We look forward to seeing you next time. Please call our clinic at [phone] if you have any questions or concerns. The best time to call is Monday-Friday from 9am-4pm, but there is someone available 24/7. If after hours or the weekend, call the main hospital number and ask for the Internal Medicine Resident On-Call. If you need medication refills, please notify your pharmacy one week in  advance and they will send [place] a request.  Thank you for letting us take part in your care. Wishing you the best!  Thank you, Idamae Schuller, MD

## 2021-03-30 NOTE — Progress Notes (Signed)
CC: ED follow up  HPI:  [name] is a 65 y.o. with medical history as below presenting to [facility] for an ED follow up.   Please see problem-based list for further details, assessments, and plans.  Past Medical History:  Diagnosis Date   Allergy    seasonal, PCN, antidepressants, bentyl   Arthritis    Asthma    Carpal tunnel syndrome on both sides    right worse than left 2008   Cataract 06/11/2013   COPD (chronic obstructive pulmonary disease) ([place])    COVID-19 virus infection 06/18/2016   Eczema 05/02/2018   GERD (gastroesophageal reflux disease)    Hyperlipidemia    LDL 108 JULY 2013   IBS (irritable bowel syndrome)    Macular rash 05/20/2019   Multiple nevi 03/01/2015   Pneumonia    Positive TB test    From MArch 2009 note : Recent F/u at [facility] CXRAy negative.  Positive TST in 2007 (49mm).  Unable to tolerate INH due to hepatotoxicity   Review of Systems:    Review of Systems  Constitutional:  Negative for chills, fever and weight loss.  HENT:  Negative for ear pain, hearing loss, sinus pain and tinnitus.   Eyes:  Negative for blurred vision, double vision and pain.  Respiratory:  Negative for cough, hemoptysis, sputum production and shortness of breath.   Cardiovascular:  Negative for chest pain, palpitations and orthopnea.  Gastrointestinal:  Positive for nausea. Negative for abdominal pain, blood in stool, diarrhea, heartburn, melena and vomiting.  Genitourinary:  Negative for dysuria, frequency and urgency.  Musculoskeletal:  Negative for back pain, myalgias and neck pain.  Skin:  Negative for itching and rash.  Neurological:  Positive for weakness. Negative for dizziness, tingling, loss of consciousness and headaches.  Endo/Heme/Allergies:  Negative for polydipsia. Does not bruise/bleed easily.  Psychiatric/Behavioral:  Negative for depression, substance abuse and suicidal ideas.    Physical Exam:  Vitals:   03/30/21 0901  BP: 129/76  Pulse: 72  Temp:  97.8 F (36.6 C)  TempSrc: Oral  SpO2: 100%  Weight: 147 lb 6.4 oz (66.9 kg)  Height: 5\' 3"  (1.6 m)    Physical Exam Constitutional:      General: She is not in acute distress.    Appearance: Normal appearance. She is not ill-appearing.  HENT:     Head: Normocephalic and atraumatic.     Right Ear: External ear normal.     Left Ear: External ear normal.     Nose: Nose normal.     Mouth/Throat:     Mouth: Mucous membranes are moist.     Pharynx: Oropharynx is clear.  Eyes:     Extraocular Movements: Extraocular movements intact.     Conjunctiva/sclera: Conjunctivae normal.     Pupils: Pupils are equal, round, and reactive to light.  Cardiovascular:     Rate and Rhythm: Normal rate and regular rhythm.     Pulses: Normal pulses.     Heart sounds: Normal heart sounds. No murmur heard.   No friction rub. No gallop.  Pulmonary:     Effort: Pulmonary effort is normal. No respiratory distress.     Breath sounds: Normal breath sounds. No stridor. No wheezing or rhonchi.  Abdominal:     General: Bowel sounds are normal. There is no distension.     Palpations: Abdomen is soft. There is no mass.     Tenderness: There is abdominal tenderness (diffuse).  Musculoskeletal:  General: No swelling or tenderness. Normal range of motion.     Cervical back: Normal range of motion and neck supple.  Skin:    Capillary Refill: Capillary refill takes less than 2 seconds.     Coloration: Skin is not jaundiced.     Findings: No erythema, lesion or rash.     Comments: Decreased skin turgor  Neurological:     General: No focal deficit present.     Mental Status: She is alert and oriented to person, place, and time. Mental status is at baseline.  Psychiatric:        Mood and Affect: Mood normal.        Behavior: Behavior normal.    Assessment & Plan:   See Encounters Tab for problem based charting.  Patient seen with Dr. Odella Aquas, MD

## 2021-03-30 NOTE — Telephone Encounter (Signed)
Saw patient while in the office today and provided her with information about a soft diet and a few sample liquid supplements.

## 2021-03-31 ENCOUNTER — Telehealth: Payer: Self-pay | Admitting: Cardiology

## 2021-03-31 NOTE — Telephone Encounter (Signed)
Pt is calling to reschedule her lab to recheck lipids and cmet. She is scheduled for 7/20 to have this done, as advised by Richardson Dopp PA-C.  Pt currently has salmonella poisoning and would like to push the lab appt out to the following week.  Rescheduled her lab appt to the following week on 7/27.  She is aware to still come fasting to that appt. Will route this message to [facility] covering [place] as an FYI.  Pt verbalized understanding and agrees with this plan.

## 2021-03-31 NOTE — Telephone Encounter (Signed)
Pt is calling in to confirm she still needs to come in for bloodwork since she has stopped taking the medication

## 2021-04-03 ENCOUNTER — Telehealth: Payer: Self-pay | Admitting: Gastroenterology

## 2021-04-03 NOTE — Telephone Encounter (Signed)
Inbound call from patient states she ate on the 4th and went through 3 days of severe diarrhea. It has not gotten completely better. Have a loss of appetite in the meantime and really weak in the morning. She is drinking lots of working and electrolytes. Not experiencing bloating, or cramps. She is requesting advice on anything else she can do to help? Best contact number [phone]

## 2021-04-03 NOTE — Telephone Encounter (Signed)
Left message on machine to call back  

## 2021-04-04 ENCOUNTER — Ambulatory Visit (INDEPENDENT_AMBULATORY_CARE_PROVIDER_SITE_OTHER): Payer: Medicare Other | Admitting: Internal Medicine

## 2021-04-04 ENCOUNTER — Telehealth: Payer: Self-pay

## 2021-04-04 VITALS — BP 121/71 | HR 99 | Temp 97.9°F | Wt 147.4 lb

## 2021-04-04 DIAGNOSIS — A059 Bacterial foodborne intoxication, unspecified: Secondary | ICD-10-CM | POA: Insufficient documentation

## 2021-04-04 DIAGNOSIS — R5383 Other fatigue: Secondary | ICD-10-CM | POA: Insufficient documentation

## 2021-04-04 HISTORY — DX: Bacterial foodborne intoxication, unspecified: A05.9

## 2021-04-04 LAB — COMPREHENSIVE METABOLIC PANEL
ALT: 29 U/L (ref 0–44)
AST: 25 U/L (ref 15–41)
Albumin: 3.8 g/dL (ref 3.5–5.0)
Alkaline Phosphatase: 58 U/L (ref 38–126)
Anion gap: 10 (ref 5–15)
BUN: <5 mg/dL — ABNORMAL LOW (ref 8–23)
CO2: 25 mmol/L (ref 22–32)
Calcium: 9.7 mg/dL (ref 8.9–10.3)
Chloride: 97 mmol/L — ABNORMAL LOW (ref 98–111)
Creatinine, Ser: 0.85 mg/dL (ref 0.44–1.00)
GFR, Estimated: >60 mL/min (ref >60)
Glucose, Bld: 113 mg/dL — ABNORMAL HIGH (ref 70–99)
Potassium: 3.9 mmol/L (ref 3.5–5.1)
Sodium: 132 mmol/L — ABNORMAL LOW (ref 135–145)
Total Bilirubin: 0.6 mg/dL (ref 0.3–1.2)
Total Protein: 5.8 g/dL — ABNORMAL LOW (ref 6.5–8.1)

## 2021-04-04 LAB — CBC WITH DIFFERENTIAL/PLATELET
Abs Immature Granulocytes: 0.02 10*3/uL (ref 0.00–0.07)
Basophils Absolute: 0 10*3/uL (ref 0.0–0.1)
Basophils Relative: 1 %
Eosinophils Absolute: 0 10*3/uL (ref 0.0–0.5)
Eosinophils Relative: 0 %
HCT: 38.5 % (ref 36.0–46.0)
Hemoglobin: 12.9 g/dL (ref 12.0–15.0)
Immature Granulocytes: 1 %
Lymphocytes Relative: 16 %
Lymphs Abs: 0.6 10*3/uL — ABNORMAL LOW (ref 0.7–4.0)
MCH: 28.5 pg (ref 26.0–34.0)
MCHC: 33.5 g/dL (ref 30.0–36.0)
MCV: 85 fL (ref 80.0–100.0)
Monocytes Absolute: 0.4 10*3/uL (ref 0.1–1.0)
Monocytes Relative: 12 %
Neutro Abs: 2.4 10*3/uL (ref 1.7–7.7)
Neutrophils Relative %: 70 %
Platelets: 283 10*3/uL (ref 150–400)
RBC: 4.53 MIL/uL (ref 3.87–5.11)
RDW: 12.8 % (ref 11.5–15.5)
WBC: 3.4 10*3/uL — ABNORMAL LOW (ref 4.0–10.5)
nRBC: 0 % (ref 0.0–0.2)

## 2021-04-04 LAB — POC HEMOCCULT BLD/STL (OFFICE/1-CARD/DIAGNOSTIC)
Card #1 Date: 7192022
Fecal Occult Blood, POC: NEGATIVE

## 2021-04-04 MED ORDER — SODIUM CHLORIDE 0.9 % IV BOLUS
1000.0000 mL | Freq: Once | INTRAVENOUS | Status: AC
  Administered 2021-04-04: 1000 mL via INTRAVENOUS

## 2021-04-04 NOTE — Assessment & Plan Note (Signed)
Assessment:  Patient has HLD and was placed on Crestor by her cardiologist. She stopped taking it and will contact cardiology to see if there is a substitute as it is causing her leg pain.   Plan: -Advised patient to contact her cardiologist regarding her Crestor -Continue to monitor -Repeat lipid as necessary

## 2021-04-04 NOTE — Assessment & Plan Note (Signed)
Assessment: Patient has GERD that is well controlled with Prilosec 40 mg. Currently not endorsing any heartburn or chest pain.  Plan: -Continue current treatment with Prilosec 40 mg daily -Continue to monitor

## 2021-04-04 NOTE — Patient Instructions (Addendum)
It was nice seeing you today! Thank you for choosing [facility] for your Primary Care.    Today we talked about:   Fatigue with muscle aches and weakness: You suffered a big infection earlier this month with the GI bug and it will unfortunately take a bit of time to recover. It will be very important to take it easy and make sure to drink plenty of fluids. I would recommend trying to drink Pedialyte over the next several days to help with hydration.   We checked some blood work today to ensure your blood counts, kidney function and liver function were doing okay. Your results were all very re-assuring! Biggest thing to focus on now is hydration with electrolytes as well such as Pedialyte.

## 2021-04-04 NOTE — Telephone Encounter (Signed)
RTC, patient states she has absolutely "no energy and is very tired".  She was seen in clinic on 03/30/21 by Dr. Chancy Milroy and was instructed to f/u in 1-2 weeks if fatigue persisted.   Red team is booked today, appt made w/ Dr. Allyson Sabal for today at 1315 for f/u w/ possible labwork. SChaplin, RN,BSN

## 2021-04-04 NOTE — Progress Notes (Signed)
CC: Fatigue  HPI:  Aimee Davis is a 65 y.o. with a PMHx as listed below who presents to the clinic for fatigue.   Please see the Encounters tab for problem-based Assessment & Plan regarding status of patient's acute and chronic conditions.  Past Medical History:  Diagnosis Date   Allergy    seasonal, PCN, antidepressants, bentyl   Arthritis    Asthma    Carpal tunnel syndrome on both sides    right worse than left 2008   Cataract 06/11/2013   COPD (chronic obstructive pulmonary disease) ([place])    COVID-19 virus infection 06/18/2016   Eczema 05/02/2018   GERD (gastroesophageal reflux disease)    Hyperlipidemia    LDL 108 JULY 2013   IBS (irritable bowel syndrome)    Macular rash 05/20/2019   Multiple nevi 03/01/2015   Pneumonia    Positive TB test    From MArch 2009 note : Recent F/u at [facility]. CXRAy negative.  Positive TST in 2007 (91mm).  Unable to tolerate INH due to hepatotoxicity   Review of Systems: Review of Systems  Constitutional:  Positive for chills (resolved), diaphoresis (resolved), fever (resolved) and malaise/fatigue.  Gastrointestinal:  Positive for abdominal pain and diarrhea. Negative for blood in stool, constipation, melena, nausea and vomiting.  Skin:  Positive for rash (resolved).  Neurological:  Positive for dizziness and weakness. Negative for focal weakness and headaches.  Psychiatric/Behavioral:  Negative for depression.    Physical Exam:  Vitals:   04/04/21 1340  BP: 121/71  Pulse: 99  Temp: 97.9 F (36.6 C)  TempSrc: Oral  SpO2: 100%  Weight: 147 lb 6.4 oz (66.9 kg)   Physical Exam Vitals and nursing note reviewed.  Constitutional:      Appearance: She is normal weight.  HENT:     Head: Normocephalic and atraumatic.     Mouth/Throat:     Mouth: Mucous membranes are dry.     Pharynx: Oropharynx is clear.  Eyes:     Extraocular Movements: Extraocular movements intact.     Conjunctiva/sclera: Conjunctivae normal.     Pupils: Pupils  are equal, round, and reactive to light.  Cardiovascular:     Rate and Rhythm: Normal rate and regular rhythm.     Heart sounds: No murmur heard. Pulmonary:     Effort: Pulmonary effort is normal.     Breath sounds: Normal breath sounds. No decreased breath sounds, wheezing, rhonchi or rales.  Abdominal:     General: Bowel sounds are normal.     Palpations: Abdomen is soft. There is no fluid wave.     Tenderness: There is abdominal tenderness in the periumbilical area. There is no right CVA tenderness, left CVA tenderness, guarding or rebound.     Hernia: No hernia is present.  Musculoskeletal:     Right lower leg: No edema.     Left lower leg: No edema.  Skin:    Coloration: Skin is not jaundiced.     Findings: No bruising, lesion or rash.  Neurological:     Mental Status: She is alert and oriented to person, place, and time.     Sensory: Sensation is intact.     Gait: Gait is intact. Gait normal.     Deep Tendon Reflexes:     Reflex Scores:      Patellar reflexes are 2+ on the right side and 2+ on the left side.    Comments: 4/5 strength in bilateral lower extremities. 5/5 strength in bilateral  upper extremities.   Psychiatric:        Mood and Affect: Mood normal.        Behavior: Behavior normal.    Assessment & Plan:   See Encounters Tab for problem based charting.  Patient discussed with Dr. Jimmye Norman

## 2021-04-04 NOTE — Assessment & Plan Note (Addendum)
Assessment: Patient is here for ED follow up after she had a July 4th cookout. She developed diarrhea, on the 5th and then started having abdominal pain. She also developed loss of appetite and now has weakness. The diarrhea has resolved since then. Other family members at the cookout had similar symptoms. Patient endorsed not eating a good meal in few days and decreased hydration.   Plan: -Continue to increase hydration and food intake -Boost nutritional shakes will be beneficial for her -She spoke with the dietitian and recommendation were made to her for easy to make and nutritious meals.  -Follow up in 10 days if symptoms persists

## 2021-04-04 NOTE — Assessment & Plan Note (Signed)
Assessment: Patient has history of asthma currently controlled with Trelegy and Albuterol. Patient endorsed adherence and minimal symptoms.  Plan:  -Continue current medications -Continue to monitor

## 2021-04-04 NOTE — Assessment & Plan Note (Signed)
Assessment: Patient has history of IBS who is currently taking IBGuard and probiotic. She endorses some flares ups from time to time but overall controlled.  Plan: -Advised to supplement with fiber -Continue to monitor

## 2021-04-04 NOTE — Telephone Encounter (Signed)
Requesting to speak with a nurse about having low energy. Please call pt back.

## 2021-04-04 NOTE — Telephone Encounter (Signed)
Spoke with patient, she states that she has no energy. She states that she went to a cookout on the 4th of July and ate something that "wasn't right." Patient states that she ate raw tuna, She states that she has been having a lack of appetite but no longer having diarrhea. Patient has some nausea but no vomiting. Patient states that she usually does not have enough energy to get up and make her any food. Patient has been advised that she is probably not getting enough calorie intake. Advised patient that she can supplement with Boost, Ensure, protein shakes or smoothies. Advised patient that she can try this for a little while to see if that makes her feel better, advised patient to call us back if no improvement. Patient verbalized understanding and had no concerns at the end of the call.

## 2021-04-04 NOTE — Telephone Encounter (Signed)
Lm on vm for patient to return call 

## 2021-04-04 NOTE — Assessment & Plan Note (Signed)
Assessment: Patient has DEXA scan that is due and Pneumonia vaccine that is due.   Plan: -Patient agreed to get DEXA scan scheduled now but advised to wait until gastroenteritis to get pneumonia vaccine -Close to close care gaps.

## 2021-04-05 ENCOUNTER — Other Ambulatory Visit: Payer: Medicare Other

## 2021-04-05 NOTE — Assessment & Plan Note (Signed)
Aimee Davis is here for evaluation of fatigue that has developed over the last 2 weeks.  She states that on 4th of July, she went to a family event where she ate tuna.  Several members of her family including herself became ill on July 5.  She describes initial periumbilical abdominal pain with watery diarrhea.  During this time, she had episodes of chills with diaphoresis.  Diarrheal episodes would occur at least 3-4 times a day.  She denies any hematochezia or melena, nausea, vomiting.  Diarrhea lasted from July 5 till July 15.   During this time, she has felt herself getting generally weaker especially in the lower extremities.  She feels profoundly fatigued, and finds that she does not have the energy to even feed herself.  She has been working on keeping up with fluid intake though and notes that she has been peeing quite often.  She endorses dizziness that is present when standing, in addition to when already walking around; she denies any falls.  She has developed lower extremity pain with cramps in addition to bilateral ankle pain.  Aimee Davis is very distraught by her fatigue and is wondering why she has not recovered.  She notes that her family members who also developed diarrhea recovered relatively quickly.  Assessment/plan: Given history, it sounds like patient had quite severe diarrheal illness secondary to food poisoning.  She was seen in the ED shortly after symptom onset, at which time she had a CT abdomen with contrast that did not demonstrate any acute abnormalities including colitis.  She was given IV fluids at that time and felt significantly better.  It is likely that her diarrheal illness was bacterial in etiology, which does place her at risk of complications such as Guillain-Barr.  I am reassured that on examination she has intact reflexes.  With her dizziness, I am wondering if her fatigue is secondary to dehydration as I imagine keeping up with the fluid loss would be difficult.   Additionally, although patient denies melena or hematochezia, fatigue could be secondary to blood loss.  We will start work-up with a CBC, CMP, and FOBT.  Additionally, will provide patient with IV fluids in the clinic.  Addendum: Patient CBC demonstrated no evidence of blood loss with a stable hemoglobin.  CMP does not demonstrate any acute renal or liver abnormalities.  FOBT was negative.  Patient is feeling much better after IV fluids and feels more confident about going home.  I strongly encouraged that she pick up some Pedialyte in order to replenish both her fluid loss as well as solute loss.  She expressed understanding.  Return precautions were discussed.

## 2021-04-06 ENCOUNTER — Encounter: Payer: Medicare Other | Admitting: Internal Medicine

## 2021-04-08 NOTE — Progress Notes (Signed)
Internal Medicine Clinic Attending  Case discussed with Dr. Basaraba  At the time of the visit.  We reviewed the resident's history and exam and pertinent patient test results.  I agree with the assessment, diagnosis, and plan of care documented in the resident's note.  

## 2021-04-10 NOTE — Progress Notes (Signed)
Internal Medicine Clinic Attending  I saw and evaluated the patient.  I personally confirmed the key portions of the history and exam documented by Dr. Khan and I reviewed pertinent patient test results.  The assessment, diagnosis, and plan were formulated together and I agree with the documentation in the resident's note.  

## 2021-04-12 ENCOUNTER — Other Ambulatory Visit: Payer: Medicare Other

## 2021-04-17 ENCOUNTER — Telehealth: Payer: Self-pay | Admitting: Student

## 2021-04-17 NOTE — Telephone Encounter (Signed)
Pt requesting a call back-Offered appt but, patient would prefer to speak with a nurse first as she states, "I am no better with my energy and feeling fatigued since her LOV 04/04/2021."

## 2021-04-17 NOTE — Telephone Encounter (Signed)
Returned call to patient. C/o no energy, "I don't feel good, real tired but can't sleep well." Appetite is "up and down." "I don't feel myself." Stated she felt "a little better with Pedialyte but not a lot." First available appt given for 8/3 at 0945 with [facility]. Patient is advised to head to ED or UC if she cannot wait till then. She states she prefers to see a Provider at [facility].

## 2021-04-19 ENCOUNTER — Encounter: Payer: Self-pay | Admitting: Student

## 2021-04-19 ENCOUNTER — Other Ambulatory Visit: Payer: Self-pay

## 2021-04-19 ENCOUNTER — Ambulatory Visit (INDEPENDENT_AMBULATORY_CARE_PROVIDER_SITE_OTHER): Payer: Medicare Other | Admitting: Student

## 2021-04-19 VITALS — BP 122/71 | HR 71 | Temp 97.5°F | Ht 63.0 in | Wt 145.2 lb

## 2021-04-19 DIAGNOSIS — R5382 Chronic fatigue, unspecified: Secondary | ICD-10-CM

## 2021-04-19 DIAGNOSIS — E559 Vitamin D deficiency, unspecified: Secondary | ICD-10-CM | POA: Diagnosis not present

## 2021-04-19 LAB — GLUCOSE, CAPILLARY: Glucose-Capillary: 109 mg/dL — ABNORMAL HIGH (ref 70–99)

## 2021-04-19 NOTE — Assessment & Plan Note (Addendum)
Patient is here for evaluation of her persistent fatigue.  Patient has had fatigue after COVID infection in 2020.  At that time, TSH, B12, CBC and CMP were unremarkable.  Patient states that her fatigue has improved until July 4.  Patient had an episode of food poisoning which prompted her to the ED for dehydration from diarrhea.  She was seen in the clinic on 7/19 and also received IV fluid.  CBC showed normal hemoglobin and white count.  FOBT was negative.  BMP was unremarkable.  UA suggest dehydration.  Today, patient reports persistent fatigue.  Said that she feels drained.  Said that she has normal appetite but does not have the strength to cook food.  She eats 3 meals a day, but smaller meals than before.  Reports drinking plenty of fluid.  Reports tremors of her hands which is new.  Said that she has been losing weights.  She denies fever or chill.  Endorses night sweats, shortness of breath, cough or sputum, blurry vision.  Said the diarrhea has resolved.  She states that her mood has been fine except that she is worried about her energy level.  Assessment and plan Broad differentials for her fatigue.  It could be secondary to poor p.o. intake.  However orthostatic vital sign was unremarkable.  Her depression might play a role but she denies worsening of her depressive symptoms.  I am also worried about cancer with her weight loss and night sweats.  Patient is up-to-date on her colonoscopy and mammogram.  She does have history of smoking in the past and has not had a lung cancer screening CT.  Will order chest x-ray now and a CT chest later.  Also check vitamin D, TSH, ESR, CRP and UA.  -Advised patient to take supplement such as Ensure.  Continue drinking plenty of fluid -Pending vitamin D, TSH, ESR, CRP, UA -Pending chest x-ray  Addendum -UA positive for pyuria.  She however denies any urinary symptoms such as dysuria or incontinence.  No indication for treatment at this time. -TSH, ESR, CRP  within normal limits -Vitamin D slightly low at 21.  Advised patient to obtain vitamin D supplement over-the-counter -Pending chest x-ray -Advised patient to take Ensure supplement -Return in 4 weeks

## 2021-04-19 NOTE — Progress Notes (Signed)
   CC: Persistent fatigue  HPI:  [name] is a 65 y.o. with past medical history of CAD, asthma, depression who presented to the clinic for evaluation of her persistent fatigue.  Please see problem based charting for detail  Past Medical History:  Diagnosis Date   Allergy    seasonal, PCN, antidepressants, bentyl   Arthritis    Asthma    Carpal tunnel syndrome on both sides    right worse than left 2008   Cataract 06/11/2013   COPD (chronic obstructive pulmonary disease) ([place])    COVID-19 virus infection 06/18/2016   Eczema 05/02/2018   GERD (gastroesophageal reflux disease)    Hyperlipidemia    LDL 108 JULY 2013   IBS (irritable bowel syndrome)    Macular rash 05/20/2019   Multiple nevi 03/01/2015   Pneumonia    Positive TB test    From MArch 2009 note : Recent F/u at [facility]. CXRAy negative.  Positive TST in 2007 (75m).  Unable to tolerate INH due to hepatotoxicity   Review of Systems:   Per HPI  Physical Exam:  Vitals:   04/19/21 1008 04/19/21 1034 04/19/21 1036 04/19/21 1038  BP: 122/71     Pulse: 71     Temp: (!) 97.5 F (36.4 C)     SpO2: 100% 100% 100% 100%  Weight: 145 lb 3.2 oz (65.9 kg)     Height: '5\' 3"'$  (1.6 m)      Physical Exam Constitutional:      General: She is not in acute distress.    Appearance: She is not toxic-appearing.  HENT:     Head: Normocephalic.  Eyes:     Conjunctiva/sclera: Conjunctivae normal.  Cardiovascular:     Rate and Rhythm: Normal rate and regular rhythm.  Pulmonary:     Effort: Pulmonary effort is normal. No respiratory distress.     Breath sounds: Normal breath sounds. No wheezing.  Abdominal:     General: Bowel sounds are normal. There is no distension.     Palpations: Abdomen is soft.     Tenderness: There is no abdominal tenderness.  Musculoskeletal:     Cervical back: Normal range of motion.     Comments: Normal gait and stance.  Mild tremors noted on outstretched arms, left worse than right.  Skin:     General: Skin is warm.     Coloration: Skin is not jaundiced.  Neurological:     Mental Status: She is alert and oriented to person, place, and time.  Psychiatric:        Mood and Affect: Mood normal.        Behavior: Behavior normal.     Assessment & Plan:   See Encounters Tab for problem based charting.  Patient discussed with Dr. HHeber Iatan

## 2021-04-19 NOTE — Patient Instructions (Signed)
Aimee Davis,  I am sorry that you not feeling well.  Today, I will check some blood work to rule out causes of your fatigue.  I will also obtain a chest x-ray for your shortness of breath, cough and sputum production.  I will call you with the results.  In the meantime, you can obtain nondairy Ensure for dietary supplement.  Please drink plenty of fluid.  Please return in clinic 4 weeks, sooner if needed.  Take care,  Dr. Alfonse Spruce

## 2021-04-20 LAB — URINALYSIS, ROUTINE W REFLEX MICROSCOPIC
Bilirubin, UA: NEGATIVE
Glucose, UA: NEGATIVE
Ketones, UA: NEGATIVE
Nitrite, UA: NEGATIVE
Protein,UA: NEGATIVE
RBC, UA: NEGATIVE
Specific Gravity, UA: 1.012 (ref 1.005–1.030)
Urobilinogen, Ur: 0.2 mg/dL (ref 0.2–1.0)
pH, UA: 6.5 (ref 5.0–7.5)

## 2021-04-20 LAB — MICROSCOPIC EXAMINATION
Casts: NONE SEEN /lpf
Epithelial Cells (non renal): NONE SEEN /hpf (ref 0–10)
RBC, Urine: NONE SEEN /hpf (ref 0–2)

## 2021-04-20 LAB — SEDIMENTATION RATE: Sed Rate: 2 mm/hr (ref 0–40)

## 2021-04-20 LAB — VITAMIN D 25 HYDROXY (VIT D DEFICIENCY, FRACTURES): Vit D, 25-Hydroxy: 21.1 ng/mL — ABNORMAL LOW (ref 30.0–100.0)

## 2021-04-20 LAB — TSH: TSH: 1.5 u[IU]/mL (ref 0.450–4.500)

## 2021-04-20 LAB — C-REACTIVE PROTEIN: CRP: <1 mg/L (ref 0–10)

## 2021-04-20 NOTE — Progress Notes (Signed)
Internal Medicine Clinic Attending  Case discussed with Dr. Nguyen  At the time of the visit.  We reviewed the resident's history and exam and pertinent patient test results.  I agree with the assessment, diagnosis, and plan of care documented in the resident's note. 

## 2021-04-22 ENCOUNTER — Other Ambulatory Visit: Payer: Self-pay | Admitting: Internal Medicine

## 2021-04-22 DIAGNOSIS — J4540 Moderate persistent asthma, uncomplicated: Secondary | ICD-10-CM

## 2021-05-04 ENCOUNTER — Telehealth: Payer: Self-pay | Admitting: Cardiology

## 2021-05-04 DIAGNOSIS — I700 Atherosclerosis of aorta: Secondary | ICD-10-CM

## 2021-05-04 DIAGNOSIS — Z79899 Other long term (current) drug therapy: Secondary | ICD-10-CM

## 2021-05-04 DIAGNOSIS — I2510 Atherosclerotic heart disease of native coronary artery without angina pectoris: Secondary | ICD-10-CM

## 2021-05-04 DIAGNOSIS — E7800 Pure hypercholesterolemia, unspecified: Secondary | ICD-10-CM

## 2021-05-04 DIAGNOSIS — E785 Hyperlipidemia, unspecified: Secondary | ICD-10-CM

## 2021-05-04 NOTE — Telephone Encounter (Signed)
pt is wanting you to reach out to her. She is wanting to know what this appt consists of... please advise

## 2021-05-04 NOTE — Telephone Encounter (Signed)
Pt aware that Aimee Davis will follow up with her on this matter within next several weeks. She is wanting to know if she should be fasting for this appt. She understands someone will follow up on this

## 2021-05-05 NOTE — Telephone Encounter (Signed)
Spoke with the pt.  She said when she comes into the office for her 6 month OV with Dr. Johney Frame, can she have her lipids checked, being the last time we checked this was back in May.  Pt also states statin meds were changed since then, and she also stopped her statin for sometime due to other issues.  Informed the pt that we can check fasting lipids and LFTs on her, same day as she comes into the clinic to see Dr Johney Frame on 08/29/21.  Advised the pt to come fasting to this lab appt.  Lab appt scheduled same day as follow-up appt on 12/13. Pt verbalized understanding and agrees with this plan.  Pt was more than gracious for all the assistance provided.

## 2021-05-05 NOTE — Telephone Encounter (Signed)
Left the pt a message to call the office back. 

## 2021-05-07 DIAGNOSIS — H43393 Other vitreous opacities, bilateral: Secondary | ICD-10-CM | POA: Diagnosis not present

## 2021-05-10 NOTE — Telephone Encounter (Signed)
Patient called states she is now having a lot of abdominal spasms\pain seeking advise.

## 2021-05-11 NOTE — Telephone Encounter (Signed)
She has had some chronic symptoms in regards to her abdomen, is this something new?  Or more of the same? She had a CT scan done last month which looked good other than a small nonspecific area of her pancreas which will be followed up with interval imaging.  Her symptoms in her upper abdomen or her lower abdomen?.  If she has had weight loss significant discomfort in her upper abdomen she may need an upper endoscopy which we could coordinate over the phone.  If she is in that much distress recommend a sooner appointment with PA, otherwise I will need to see her in the office to help sort this out otherwise. Recent labs looked okay. Agree with your recommendations in regards to her bowel regimen.

## 2021-05-11 NOTE — Telephone Encounter (Signed)
Lm on vm for patient to return call 

## 2021-05-11 NOTE — Telephone Encounter (Signed)
Spoke with patient, she states that 2 days ago she ate something that made her have a flare. Patient states that she ate baked fish, black beans, and squash. Pt states that she developed stomach pain, the pain kept her up until about 4 am. She states that she used peppermint tablets TID yesterday. She states that she used a heating pad on her stomach which helped. She reports that she doesn't have complete constipation, her stools are coming off in small pieces. Pt states that she is only taking 1 dose of Miralax daily. Advised patient that she can increase Miralax up to 3 times a day. Pt states that she also took Sucralfate yesterday. She did have some nausea but no vomiting. Pt is not sure if she has any Zofran left. She states that she has lost about 25-30 lbs in the last 2 months. Offered patient a sooner appt with a PA or NP but she prefers to see Dr. Havery Moros. Pt has been scheduled for a follow up with Dr. Havery Moros on Wednesday, 06/14/21 at 3:20 PM. Please advise, thanks.

## 2021-05-12 ENCOUNTER — Other Ambulatory Visit: Payer: Self-pay | Admitting: Internal Medicine

## 2021-05-12 NOTE — Telephone Encounter (Signed)
Lm on vm for patient to return call 

## 2021-05-17 NOTE — Telephone Encounter (Signed)
Lm on vm for patient to return call 

## 2021-05-19 NOTE — Telephone Encounter (Signed)
Lm on vm for patient to return call 

## 2021-05-23 NOTE — Telephone Encounter (Signed)
No return call received, will await further contact from patient.

## 2021-05-24 ENCOUNTER — Encounter: Payer: Medicare Other | Admitting: Internal Medicine

## 2021-05-30 DIAGNOSIS — L249 Irritant contact dermatitis, unspecified cause: Secondary | ICD-10-CM | POA: Diagnosis not present

## 2021-05-30 DIAGNOSIS — L811 Chloasma: Secondary | ICD-10-CM | POA: Diagnosis not present

## 2021-06-09 ENCOUNTER — Encounter: Payer: Medicare Other | Admitting: Internal Medicine

## 2021-06-14 ENCOUNTER — Encounter: Payer: Self-pay | Admitting: Gastroenterology

## 2021-06-14 ENCOUNTER — Other Ambulatory Visit: Payer: Self-pay | Admitting: Student

## 2021-06-14 ENCOUNTER — Ambulatory Visit (INDEPENDENT_AMBULATORY_CARE_PROVIDER_SITE_OTHER): Payer: Medicare Other | Admitting: Gastroenterology

## 2021-06-14 VITALS — BP 110/60 | HR 84 | Ht 63.0 in | Wt 145.8 lb

## 2021-06-14 DIAGNOSIS — R6889 Other general symptoms and signs: Secondary | ICD-10-CM

## 2021-06-14 DIAGNOSIS — K581 Irritable bowel syndrome with constipation: Secondary | ICD-10-CM

## 2021-06-14 DIAGNOSIS — R109 Unspecified abdominal pain: Secondary | ICD-10-CM

## 2021-06-14 DIAGNOSIS — R0989 Other specified symptoms and signs involving the circulatory and respiratory systems: Secondary | ICD-10-CM

## 2021-06-14 DIAGNOSIS — R933 Abnormal findings on diagnostic imaging of other parts of digestive tract: Secondary | ICD-10-CM | POA: Diagnosis not present

## 2021-06-14 DIAGNOSIS — Z1231 Encounter for screening mammogram for malignant neoplasm of breast: Secondary | ICD-10-CM

## 2021-06-14 MED ORDER — LUBIPROSTONE 8 MCG PO CAPS: 8.0000 ug | ORAL_CAPSULE | Freq: Two times a day (BID) | ORAL | 3 refills | Status: DC

## 2021-06-14 NOTE — Patient Instructions (Addendum)
If you are age 67 or older, your body mass index should be between 23-30. Your Body mass index is 25.83 kg/m. If this is out of the aforementioned range listed, please consider follow up with your Primary Care Provider.  If you are age 36 or younger, your body mass index should be between 19-25. Your Body mass index is 25.83 kg/m. If this is out of the aformentioned range listed, please consider follow up with your Primary Care Provider.   __________________________________________________________  The [place] [facility] providers would like to encourage you to use [facility] to communicate with providers for non-urgent requests or questions.  Due to long hold times on the telephone, sending your provider a message by [facility] may be a faster and more efficient way to get a response.  Please allow 48 business hours for a response.  Please remember that this is for non-urgent requests.   We have sent the following medications to your pharmacy for you to pick up at your convenience: Amitiza 8 mg: Take twice daily with meals  Discontinue Miralax and Carafate.  Take omeprazole 40 mg once daily.  You will be due for an MRCP to evaluate the pancreas in 03-2022.  We will remind you when it is time to schedule.  Thank you for entrusting me with your care and for choosing [facility], Dr. Carrizo Cellar

## 2021-06-14 NOTE — Progress Notes (Signed)
HPI :  65 year old female here for a follow-up visit, history of IBS, abdominal discomfort.     History of IBS. Longstanding lower abdominal cramping around her umbilicus to lower abdomen.  Previously she had an extensive work-up at [facility], she had a negative CT scan in 2013 in 2011 for this issue.  She has had negative colonoscopies in the past, her last exam was done by me in March 2020 without any high risk or concerning pathology.    Since her last visit we repeated a CT scan in April 2021 which showed no pathology to account for her symptoms.  Since her last visit with me in June she states she went to a cookout and got food poisoning.  She had abdominal pain that bothered her and she went for another ER visit and had a CT scan done on July 10.  The exam did not show any acute findings but incidentally noted was a indeterminate 3 mm hypodensity in the pancreatic body.  Radiology recommended consideration for a 2-year follow-up imaging study.  She states it took her body some time to get over the food poisoning and get back to her baseline.  Her baseline has been constipation.  She is using MiraLAX every day upwards of 3 times daily and states it still hard to get stool out and she has a bowel movement every few days and it is hard.  She just does not think the MiraLAX is working.  Recall she had been on Linzess in the past dosed at 72 mcg a day and she thought that was a bit too strong for her so he had been using otherwise MiraLAX.  She states she continues to get some abdominal pain in her periumbilical to lower abdominal area but she feels like it is very much related to her IBS.  If she has a bowel movement her stomach feels much better and relieves her pain.  She is frustrated by her ongoing constipation.  She otherwise has been stating she has frequent throat clearing with mucus and wonders if it is related to reflux.  She states this often bothers her after she eats a meal, however  denies any heartburn or reflux symptoms otherwise.  Denies any allergies or postnasal drip.  Denies any cough otherwise.  I had given her some omeprazole 40 mg a day and Carafate as needed for some nausea and reflux when she saw me at the last visit.  She has not been taking these reliably and taken a few doses sporadically.  She is not having any nausea or vomiting any longer and denies any postprandial symptoms.  Otherwise eating pretty well.   More recent work-up: CT scan abdomen / pelvis 12/17/19 - IMPRESSION: No acute findings in the abdomen or pelvis. Specifically, no findings to explain the patient's history of abdominal pain and weight loss.   Colonoscopy 11/26/18 - The perianal and digital rectal examinations were normal. - A diminutive polyp was found in the cecum. The polyp was sessile. The polyp was removed with a cold snare. Resection and retrieval were complete. - The colon was tortuous. - Internal hemorrhoids were found during retroflexion. The hemorrhoids were small. - The exam was otherwise without abnormality. Of note the AO was normal, I thought a photo was taken of it, but was not.  CT scan abdomen pelvis 03/26/21 - IMPRESSION: 1. No acute findings in the abdomen/pelvis. 2. Indeterminate 3 mm hypodensity over the body of the pancreas. Recommend follow up  pre and post contrast MRI/MRCP or pancreatic protocol CT in 2 years. This recommendation follows ACR consensus guidelines: Management of Incidental Pancreatic Cysts: A White Paper of the ACR Incidental Findings Committee. J Am Coll Radiol 5573;22:025-427. 3. Aortic atherosclerosis.       Past Medical History:  Diagnosis Date   Allergy    seasonal, PCN, antidepressants, bentyl   Arthritis    Asthma    Carpal tunnel syndrome on both sides    right worse than left 2008   Cataract 06/11/2013   COPD (chronic obstructive pulmonary disease) ([place])    COVID-19 virus infection 06/18/2016   Eczema 05/02/2018   GERD  (gastroesophageal reflux disease)    Hyperlipidemia    LDL 108 JULY 2013   IBS (irritable bowel syndrome)    Macular rash 05/20/2019   Multiple nevi 03/01/2015   Pneumonia    Positive TB test    From [facility] 2009 note : Recent F[facility] at [facility] CXRAy negative.  Positive TST in 2007 (46mm).  Unable to tolerate INH due to hepatotoxicity     Past Surgical History:  Procedure Laterality Date   BREAST CYST EXCISION Right 2011   BREAST EXCISIONAL BIOPSY     benign   BREAST EXCISIONAL BIOPSY Right 1990   benign   INDUCED ABORTION  2005   VAGINAL HYSTERECTOMY  1983   partial; for abnormal bleeding   Family History  Problem Relation Age of Onset   Hypertension Mother    Heart disease Mother    Diabetes Mother    Arthritis Mother    Stroke Father    Diabetes Father    Heart disease Father    Hyperlipidemia Father    Hypertension Father    Kidney disease Father    Hypertension Sister    Breast cancer Sister    Breast cancer Sister    Hypertension Sister    Arthritis Sister    Hypertension Sister    Diabetes Sister    Mental illness Brother    Aneurysm Brother        brain   Arthritis Brother    Hypertension Maternal Aunt    Hypertension Maternal Uncle    Stomach cancer Maternal Uncle    Hypertension Paternal Aunt    Kidney disease Paternal Aunt        x 2   Hypertension Paternal Uncle    Kidney disease Paternal Uncle        x 2   Colon cancer Neg Hx    Esophageal cancer Neg Hx    Pancreatic cancer Neg Hx    Social History   Tobacco Use   Smoking status: Former    Packs/day: 0.33    Years: 43.00    Pack years: 14.19    Types: Cigarettes    Quit date: 01/06/2013    Years since quitting: 8.4   Smokeless tobacco: Never  Vaping Use   Vaping Use: Never used  Substance Use Topics   Alcohol use: No    Alcohol/week: 0.0 standard drinks   Drug use: Not Currently    Comment: x 26 yrs.   Current Outpatient Medications  Medication Sig Dispense Refill   albuterol (VENTOLIN  HFA) 108 (90 Base) MCG/ACT inhaler INHALE 2 PUFFS INTO THE LUNGS EVERY 6 HOURS AS NEEDED FOR WHEEZING OR SHORTNESS OF BREATH 6.7 g 2   Artificial Tear Ointment (DRY EYES OP) Apply 1 drop to eye daily as needed (dry eyes).     Ascorbic Acid (VITAMIN C) 100  MG tablet Take 50 mg by mouth daily.     aspirin EC 81 MG tablet Take 1 tablet (81 mg total) by mouth daily. Swallow whole.     B Complex-C (B-COMPLEX WITH VITAMIN C) tablet Take 1 tablet by mouth daily.     cetirizine (ZYRTEC) 10 MG tablet Take 10 mg by mouth daily.     Cholecalciferol (VITAMIN D) 125 MCG (5000 UT) CAPS Take 1-2 tablets by mouth daily.     lubiprostone (AMITIZA) 8 MCG capsule Take 1 capsule (8 mcg total) by mouth 2 (two) times daily with a meal. 60 capsule 3   Magnesium 400 MG TABS Take 2 each by mouth daily.     metoprolol tartrate (LOPRESSOR) 25 MG tablet Patient to take 1 tablet by mouth 2 hours prior to procedure 1 tablet 0   mometasone (NASONEX) 50 MCG/ACT nasal spray PLACE 2 SPRAYS IN EACH NOSTRIL ONCE DAILY AS NEEDED. 17 g 6   omeprazole (PRILOSEC) 40 MG capsule Take 1 capsule (40 mg total) by mouth daily. 30 capsule 3   ondansetron (ZOFRAN ODT) 4 MG disintegrating tablet Take 1 tablet (4 mg total) by mouth every 8 (eight) hours as needed for nausea or vomiting. 20 tablet 1   OVER THE COUNTER MEDICATION EO MEGA Essential Oil - 2 daily     Peppermint Oil (IBGARD) 90 MG CPCR Take 1 capsule by mouth 2 (two) times daily as needed. Take as directed as needed     Probiotic Product (PROBIOTIC DAILY PO) Take 1 tablet by mouth as needed (IBS flare ups).     rosuvastatin (CRESTOR) 5 MG tablet Take 1 tablet (5 mg total) by mouth daily. 90 tablet 1   simethicone (GAS-X) 80 MG chewable tablet Use as directed as needed 30 tablet 0   TRELEGY ELLIPTA 200-62.5-25 MCG/INH AEPB INHALE 1 PUFF INTO THE LUNGS DAILY 60 each 4   No current facility-administered medications for this visit.   Allergies  Allergen Reactions   Atorvastatin Other  (See Comments)    Pt reports causes upset stomach, cramps in legs, poor appetite, and sleep disturbance   Hydrocodone Other (See Comments)    Recovering addict X 29 years; no narcotics per patient   Penicillins Other (See Comments)    REACTION: rash   Levsin [Hyoscyamine] Anxiety     Review of Systems: All systems reviewed and negative except where noted in HPI.   Lab Results  Component Value Date   WBC 3.4 (L) 04/04/2021   HGB 12.9 04/04/2021   HCT 38.5 04/04/2021   MCV 85.0 04/04/2021   PLT 283 04/04/2021    Lab Results  Component Value Date   CREATININE 0.85 04/04/2021   BUN <5 (L) 04/04/2021   NA 132 (L) 04/04/2021   K 3.9 04/04/2021   CL 97 (L) 04/04/2021   CO2 25 04/04/2021    Lab Results  Component Value Date   ALT 29 04/04/2021   AST 25 04/04/2021   ALKPHOS 58 04/04/2021   BILITOT 0.6 04/04/2021       Physical Exam: BP 110/60   Pulse 84   Ht 5\' 3"  (1.6 m)   Wt 145 lb 12.8 oz (66.1 kg)   BMI 25.83 kg/m  Constitutional: Pleasant,well-developed, female in no acute distress. Neurological: Alert and oriented to person place and time. Psychiatric: Normal mood and affect. Behavior is normal.   ASSESSMENT AND PLAN: 65 year old female here for reassessment of following:  IBS-C Abdominal discomfort Chronic throat clearing Abnormal CT pancreas  Discussed the patient's course with her.  She does have IBS at baseline and then sounds like she had some food poisoning that led to an ER visit over July.  CT scan did not show anything too concerning but incidentally noted to have a small pancreatic lesion.  Now she is back to her baseline constipation and MiraLAX is not working well.  She felt low-dose Linzess was too strong.  I discussed other options with her.  We will plan on trying her on low-dose Amitiza to see if that may be a bit more tolerable get stronger than the MiraLAX.  She was agreeable to do this and prescription placed.  In regards to her chronic  throat clearing, she does endorse a prandial relationship so could be related to reflux however she does not have any other typical reflux symptoms.  I previously gave her some omeprazole 40 mg a day and she really did not take that routinely.  Recommend taking omeprazole once daily every day for few weeks and see if that helps reduce her throat clearing symptoms.  If not we will need additional evaluation.  She can stop her Carafate, she has been taking it much anyway.  I reviewed her CT scan with her, small indeterminate lesion in the pancreatic body.  Radiology recommending a 2-year follow-up imaging study, I think a follow-up MRCP in 1 year is reasonable to reassess this.  It seems quite small benign I do not think causing her symptoms. She agreed.  Plan: - stop Miralax - trial Amitiza 74mcg BID (can increase dose if needed) - d/c carafate - take omeprazole 40mg  / day for 30 days - recall MRCP in July 2023 - reassured her otherwise of CT scan  Jolly Mango, MD [facility]

## 2021-06-15 ENCOUNTER — Encounter: Payer: Self-pay | Admitting: Internal Medicine

## 2021-06-15 ENCOUNTER — Ambulatory Visit (INDEPENDENT_AMBULATORY_CARE_PROVIDER_SITE_OTHER): Payer: Medicare Other | Admitting: Internal Medicine

## 2021-06-15 ENCOUNTER — Other Ambulatory Visit: Payer: Self-pay

## 2021-06-15 DIAGNOSIS — J4540 Moderate persistent asthma, uncomplicated: Secondary | ICD-10-CM | POA: Diagnosis not present

## 2021-06-15 DIAGNOSIS — J302 Other seasonal allergic rhinitis: Secondary | ICD-10-CM | POA: Diagnosis not present

## 2021-06-15 MED ORDER — TRELEGY ELLIPTA 200-62.5-25 MCG/INH IN AEPB: 1.0000 | INHALATION_SPRAY | Freq: Every day | RESPIRATORY_TRACT | 4 refills | Status: DC

## 2021-06-15 MED ORDER — MOMETASONE FUROATE 50 MCG/ACT NA SUSP: NASAL | 6 refills | Status: DC

## 2021-06-15 MED ORDER — VITAMIN D (CHOLECALCIFEROL) 10 MCG (400 UNIT) PO CAPS: 2.0000 | ORAL_CAPSULE | Freq: Every day | ORAL | 3 refills | Status: AC

## 2021-06-15 NOTE — Patient Instructions (Addendum)
To Aimee Davis,   It was a pleasure meeting you today! Today we discussed your vitamin D levels. Please continue taking your Vitamin D Supplementation. Please pick up your supplementation and take 2 tablets once a day for 30 days. We will check your Vitamin D levels at your next visit in one month.  Maudie Mercury, MD

## 2021-06-15 NOTE — Progress Notes (Signed)
   CC: Fatigue Follow Up  HPI:  [name] is a 65 y.o. person, with a PMH noted below, who presents to the clinic for fatigue follow up. To see the management of their acute and chronic conditions, please see the A&P note under the Encounters tab.   Past Medical History:  Diagnosis Date   Allergy    seasonal, PCN, antidepressants, bentyl   Arthritis    Asthma    Carpal tunnel syndrome on both sides    right worse than left 2008   Cataract 06/11/2013   COPD (chronic obstructive pulmonary disease) ([place])    COVID-19 virus infection 06/18/2016   Eczema 05/02/2018   GERD (gastroesophageal reflux disease)    Hyperlipidemia    LDL 108 JULY 2013   IBS (irritable bowel syndrome)    Macular rash 05/20/2019   Multiple nevi 03/01/2015   Pneumonia    Positive TB test    From [facility] 2009 note : Recent F/u at [facility]. CXRAy negative.  Positive TST in 2007 (64mm).  Unable to tolerate INH due to hepatotoxicity   Review of Systems:   Review of Systems  Constitutional:  Negative for chills, fever, malaise/fatigue and weight loss.  Cardiovascular:  Negative for chest pain, palpitations and orthopnea.  Gastrointestinal:  Negative for abdominal pain, constipation, diarrhea, nausea and vomiting.  Neurological:  Negative for dizziness, tingling and headaches.    Physical Exam:  Vitals:   06/15/21 1524  BP: 118/70  Pulse: 82  Resp: (!) 28  Temp: 97.6 F (36.4 C)  TempSrc: Oral  SpO2: 100%  Weight: 147 lb (66.7 kg)  Height: 5\' 3"  (1.6 m)   Physical Exam Constitutional:      General: She is not in acute distress.    Appearance: Normal appearance. She is not ill-appearing, toxic-appearing or diaphoretic.  HENT:     Head: Normocephalic and atraumatic.  Cardiovascular:     Rate and Rhythm: Normal rate and regular rhythm.     Pulses: Normal pulses.     Heart sounds: Normal heart sounds. No murmur heard.   No friction rub. No gallop.  Pulmonary:     Effort: Pulmonary effort is normal. No  respiratory distress.     Breath sounds: Normal breath sounds. No stridor. No wheezing, rhonchi or rales.  Chest:     Chest wall: No tenderness.  Neurological:     Mental Status: She is alert.  Psychiatric:        Mood and Affect: Mood normal.        Behavior: Behavior normal.     Assessment & Plan:   See Encounters Tab for problem based charting.  Patient discussed with Dr. Philipp Ovens

## 2021-06-19 ENCOUNTER — Encounter: Payer: Self-pay | Admitting: *Deleted

## 2021-06-19 NOTE — Progress Notes (Unsigned)

## 2021-06-23 ENCOUNTER — Telehealth: Payer: Self-pay | Admitting: *Deleted

## 2021-06-23 NOTE — Telephone Encounter (Signed)
Patient called in stating she needs someone with specific knowledge of the emotions people deal with with long covid. States she has spoken with a therapist in the past but that's not what she needs. States she has "been going through some emotions" in the last 3 days. Patient denies SI/HI. Relayed number to [facility] [phone]). Will also forward to PCP and Dr. Theodis Shove to see if they know any resources specific to emotional support 2/2 long covid.

## 2021-06-26 ENCOUNTER — Telehealth: Payer: Self-pay | Admitting: Behavioral Health

## 2021-06-26 NOTE — Telephone Encounter (Signed)
I can contact this Pt today & see how I can help. Emot'l needs are specific to the person, not LT Covid, but I can also explore the research-

## 2021-06-26 NOTE — Telephone Encounter (Signed)
I will explore this also & let you know; good for me to know these resources also.

## 2021-06-26 NOTE — Telephone Encounter (Signed)
Contacted Pt 3 times today re: Referral from Triage RN Lauren D.  Third time reached Pt & spoke @ length about her mental health needs. Pt agreed to bimonthly sessions to address her concerns for COVID-19 [place].  Dr. Theodis Shove

## 2021-06-26 NOTE — Telephone Encounter (Signed)
Return pt's call - mailbox is full, unable to leave a message.

## 2021-06-26 NOTE — Telephone Encounter (Signed)
Thank you Dr. Theodis Shove. I wonder if there are some support groups on the web such as a Facebook group that will meet her needs.

## 2021-06-26 NOTE — Telephone Encounter (Signed)
Requesting to speak with a nurse about something. Please call pt back.  °

## 2021-06-27 ENCOUNTER — Encounter: Payer: Self-pay | Admitting: *Deleted

## 2021-06-27 NOTE — Progress Notes (Unsigned)

## 2021-06-30 ENCOUNTER — Telehealth: Payer: Self-pay | Admitting: *Deleted

## 2021-06-30 NOTE — Telephone Encounter (Signed)
Patient called in [place], asking to speak with Dr. Theodis Shove. Explained she was gone for the day. First available appt given for 10/18 at 1100. Discussed calling [facility] [place]. States she has called them before, been put on hold, and no one picks up. Relayed numbers to 24 hour call center at [place] as well as [facility] [phone]. She repeated numbers back correctly. She was very Patent attorney.

## 2021-06-30 NOTE — Telephone Encounter (Signed)
Thank you :)

## 2021-07-03 ENCOUNTER — Encounter: Payer: Self-pay | Admitting: Student

## 2021-07-03 ENCOUNTER — Telehealth: Payer: Self-pay | Admitting: Behavioral Health

## 2021-07-03 NOTE — Progress Notes (Unsigned)
Things That May Be Affecting Your Health:  Alcohol  Hearing loss  Pain   X Depression  Home Safety  Sexual Health   Diabetes  Lack of physical activity  Stress  X Difficulty with daily activities  Loneliness  Tiredness   Drug use X Medicines  Tobacco use   Falls  Motor Vehicle Safety  Weight   Food choices  Oral Health  Other    YOUR PERSONALIZED HEALTH PLAN : 1. Schedule your next subsequent Medicare Wellness visit in one year 2. Attend all of your regular appointments to address your medical issues 3. Complete the preventative screenings and services   Annual Wellness Visit   Medicare Covered Preventative Screenings and [place] Men and Women Who How Often Need? Date of Last Service Action  Abdominal Aortic Aneurysm Adults with AAA risk factors Once      Alcohol Misuse and Counseling All Adults Screening once a year if no alcohol misuse. Counseling up to 4 face to face sessions.     Bone Density Measurement  Adults at risk for osteoporosis Once every 2 yrs X N/AX    Lipid Panel Z13.6 All adults without CV disease Once every 5 yrs       Colorectal Cancer  Stool sample or Colonoscopy All adults 66 and older  Once every year Every 10 years        Depression All Adults Once a year X Today   Diabetes Screening Blood glucose, post glucose load, or GTT Z13.1 All adults at risk Pre-diabetics Once per year Twice per year      Diabetes  Self-Management Training All adults Diabetics 10 hrs first year; 2 hours subsequent years. Requires Copay     Glaucoma Diabetics Family history of glaucoma African Americans 63 yrs + Hispanic Americans 18 yrs + Annually - requires coppay      Hepatitis C Z72.89 or F19.20 High Risk for HCV Born between 1945 and 1965 Annually Once      HIV Z11.4 All adults based on risk Annually btw ages 65 & 50 regardless of risk Annually > 65 yrs if at increased risk      Lung Cancer Screening Asymptomatic adults aged 45-77 with  30 pack yr history and current smoker OR quit within the last 15 yrs Annually Must have counseling and shared decision making documentation before first screen      Medical Nutrition Therapy Adults with  Diabetes Renal disease Kidney transplant within past 3 yrs 3 hours first year; 2 hours subsequent years     Obesity and Counseling All adults Screening once a year Counseling if BMI 30 or higher  Today   Tobacco Use Counseling Adults who use tobacco  Up to 8 visits in one year     Vaccines Z23 Hepatitis B Influenza  Pneumonia  Adults  Once Once every flu season Two different vaccines separated by one year     Next Annual Wellness Visit People with Medicare Every year  Today     Services & Screenings Women Who How Often Need  Date of Last Service Action  Mammogram  Z12.31 Women over 52 One baseline ages 28-39. Annually ager 47 yrs+ X  06/17/2020 Pending mammogram on 07/11/21  Pap tests All women Annually if high risk. Every 2 yrs for normal risk women      Screening for cervical cancer with  Pap (Z01.419 nl or Z01.411abnl) & HPV Z11.51 Women aged 86 to 55 Once every 5 yrs  Screening pelvic and breast exams All women Annually if high risk. Every 2 yrs for normal risk women     Sexually Transmitted Diseases Chlamydia Gonorrhea Syphilis All at risk adults Annually for non pregnant females at increased risk         [place] Men Who How Ofter Need  Date of Last Service Action  Prostate Cancer - DRE & PSA Men over 50 Annually.  DRE might require a copay.        Sexually Transmitted Diseases Syphilis All at risk adults Annually for men at increased risk      Health Maintenance List Health Maintenance  Topic Date Due   Zoster Vaccines- Shingrix (1 of 2) Never done   DEXA SCAN  Never done   COVID-19 Vaccine (4 - Booster for Pfizer series) 12/16/2020   INFLUENZA VACCINE  04/17/2021   MAMMOGRAM  06/17/2022   TETANUS/TDAP  04/13/2025   COLONOSCOPY (Pts  45-62yrs Insurance coverage will need to be confirmed)  11/25/2025   Hepatitis C Screening  Completed   HPV VACCINES  Aged Out

## 2021-07-03 NOTE — Telephone Encounter (Signed)
Pt contacted @ 11:15am, 11:20am, & @ 3:20pm to address concerns voiced to Triage RN last Friday.  Lft 3 msg Clinician is trying to contact w/details to [facility] to [facility] as able & we will connect.   Pt is worried & was upset due to Long COVID-19 concerns mentally & cognitively.   Dr. Theodis Shove

## 2021-07-04 ENCOUNTER — Ambulatory Visit: Payer: Medicare Other | Admitting: Behavioral Health

## 2021-07-04 DIAGNOSIS — F332 Major depressive disorder, recurrent severe without psychotic features: Secondary | ICD-10-CM

## 2021-07-04 DIAGNOSIS — F419 Anxiety disorder, unspecified: Secondary | ICD-10-CM

## 2021-07-05 NOTE — BH Specialist Note (Signed)
Integrated Behavioral Health via Telemedicine Visit  07/05/2021 ROSABEL SERMENO [id number]  Number of [place] visits: 1/6 Session Start time: 11:00am  Session End time: 11:30am Total time: 30  Referring Provider: Almeta Monas., RN Patient/Family location: Pt is home in private [facility] Provider location: [facility] All persons participating in visit: Pt & Clinician Types of Service: Individual psychotherapy and Health Promotion  I connected with [name] and/or Anique C Fidalgo's  self  via  Telephone or Geologist, engineering  (Video is Tree surgeon) and verified that I am speaking with the correct person using two identifiers. Discussed confidentiality: Yes   I discussed the limitations of telemedicine and the availability of in person appointments.  Discussed there is a possibility of technology failure and discussed alternative modes of communication if that failure occurs.  I discussed that engaging in this telemedicine visit, they consent to the provision of behavioral healthcare and the services will be billed under their insurance.  Patient and/or legal guardian expressed understanding and consented to Telemedicine visit: Yes   Presenting Concerns: Patient and/or family reports the following symptoms/concerns: elevated anx/dep due to long hauler Sx of COVIC-19; Pt currently exp'g lack of focus & conc that comes & goes, skin chnages since COVID-19, severe HA in various places in her head-sometimes her eyes hurt, excessive sleeping, intermittent fear of being home alone. Pt feels, "this is holding onto me" & her Family is often unsympathetic w/a lack of understanding she is not in her best health, Px'ly, emot'ly, mentally or cognitively. Duration of problem: since she had COVID-19 in 2020; Severity of problem: moderate w/reduced tolerance ability by Pt after 2+ yrs  Patient and/or Family's Strengths/Protective Factors: Social  connections, Social and Emotional competence, Concrete supports in place (healthy food, safe environments, etc.), and Sense of purpose  Goals Addressed: Patient will:  Reduce symptoms of: anxiety, depression, stress, and inc'd Sx of Long COVID mentally including, anx/dep, headaches, disrupted sleep hygiene, general brain fog    Increase knowledge and/or ability of: coping skills, stress reduction, and Sx of Long COVID    Demonstrate ability to: Increase healthy adjustment to current life circumstances, Increase adequate support systems for patient/family, and provide adequate resources from an objective listener who is not family/friend  Progress towards Goals: Estb'd today: Pt will attend psychotherapy sessions for psychoedu, moral & emot'l support, & encouragement for current health status adjustments  Interventions: Interventions utilized:  Supportive Counseling, Psychoeducation and/or Health Education, and validation/normalization of health status concerns due to Long COVID Standardized Assessments completed:  screeners prn  Patient and/or Family Response: Pt receptive & grateful for call today. Pt agrees to future appts.   Assessment: Patient currently experiencing elevated Sx of Long COVID. Pt challenged to explain the mental health impact to her satisfaction. Assured Pt she was very articulate in her verbalizations of her exp.  Pt is struggling today w/the long term impact COVID is having on her holistically.  Patient may benefit from cont's support/encouragement & psychoedu for her concerns about Long COVID & her mental health wellness. This is now impacting her Family & their ability/willingness to understand her situation.   Plan: Follow up with behavioral health clinician on : 2-3 wks on telehealth for 30 min ck-in Behavioral recommendations: Keep a notebook describing the Sx. Use the resources provided today & those being mailed to you. Referral(s):  Community Support [employer] for  [employer]  I discussed the assessment and treatment plan with the patient and/or parent/guardian. They  were provided an opportunity to ask questions and all were answered. They agreed with the plan and demonstrated an understanding of the instructions.   They were advised to call back or seek an in-person evaluation if the symptoms worsen or if the condition fails to improve as anticipated.  Donnetta Hutching, LMFT

## 2021-07-11 ENCOUNTER — Other Ambulatory Visit: Payer: Self-pay

## 2021-07-11 ENCOUNTER — Ambulatory Visit
Admission: RE | Admit: 2021-07-11 | Discharge: 2021-07-11 | Disposition: A | Discharge status: Home / Self Care | Payer: Medicare Other | Source: Ambulatory Visit | Attending: *Deleted | Admitting: *Deleted

## 2021-07-11 DIAGNOSIS — Z1231 Encounter for screening mammogram for malignant neoplasm of breast: Secondary | ICD-10-CM

## 2021-07-18 ENCOUNTER — Ambulatory Visit: Payer: Medicare Other | Admitting: Behavioral Health

## 2021-08-09 ENCOUNTER — Ambulatory Visit: Payer: Medicare Other | Admitting: Behavioral Health

## 2021-08-09 ENCOUNTER — Telehealth: Payer: Self-pay | Admitting: Behavioral Health

## 2021-08-09 NOTE — Telephone Encounter (Signed)
Spoke w/Pt briefly today to ck-in on mental health wellness. Pt is @ her Mother's home assisting her & Str w/prep for Thxgiving. Pt appreciated call & will meet again after the Holiday.  Dr. Theodis Shove

## 2021-08-15 ENCOUNTER — Other Ambulatory Visit: Payer: Self-pay

## 2021-08-16 NOTE — Progress Notes (Deleted)
Cardiology Office Note:    Date:  08/16/2021   ID:  Aimee Davis, DOB October 16, 1955, MRN [id number]  PCP:  Lacinda Axon, MD   [place]  Cardiologist:  Freada Bergeron, MD  Advanced Practice Provider:  No care team member to display Electrophysiologist:  None   Referring MD: Lacinda Axon, MD    History of Present Illness:    Aimee Davis is a 65 y.o. female with a hx of chronic fatigue, GERD and asthma who presents for follow-up.   Patient last seen in clinic where she was being seen for DOE and chest pain. Coronary CTA 11/2020 with minimal disease in RCA. Ca score 90 (83% for age and sex matched control). TTE 12/2020 with LVEF 60-65%, normal RV, no significant valve disease. It was deemed her symptoms were not cardiac in nature.  Today***  Family History: Father with CAD with 2 Mis; sister with CAD, ESRD (age 47), mother HTN, sister HTN  Past Medical History:  Diagnosis Date   Allergy    seasonal, PCN, antidepressants, bentyl   Arthritis    Asthma    Carpal tunnel syndrome on both sides    right worse than left 2008   Cataract 06/11/2013   COPD (chronic obstructive pulmonary disease) ([place])    COVID-19 virus infection 06/18/2016   Eczema 05/02/2018   GERD (gastroesophageal reflux disease)    Hyperlipidemia    LDL 108 JULY 2013   IBS (irritable bowel syndrome)    Macular rash 05/20/2019   Multiple nevi 03/01/2015   Pneumonia    Positive TB test    From MArch 2009 note : Recent F/u at [facility]. CXRAy negative.  Positive TST in 2007 (26mm).  Unable to tolerate INH due to hepatotoxicity    Past Surgical History:  Procedure Laterality Date   BREAST CYST EXCISION Right 2011   BREAST EXCISIONAL BIOPSY     benign   BREAST EXCISIONAL BIOPSY Right 1990   benign   INDUCED ABORTION  2005   VAGINAL HYSTERECTOMY  1983   partial; for abnormal bleeding    Current Medications: No outpatient medications have been marked as taking for  the 08/29/21 encounter (Appointment) with Freada Bergeron, MD.     Allergies:   Atorvastatin, Hydrocodone, Penicillins, and Levsin [hyoscyamine]   Social History   Socioeconomic History   Marital status: Divorced    Spouse name: Not on file   Number of children: 2   Years of education: Not on file   Highest education level: Not on file  Occupational History   Occupation: retired  Tobacco Use   Smoking status: Former    Packs/day: 0.33    Years: 43.00    Pack years: 14.19    Types: Cigarettes    Quit date: 01/06/2013    Years since quitting: 8.6   Smokeless tobacco: Never  Vaping Use   Vaping Use: Never used  Substance and Sexual Activity   Alcohol use: No    Alcohol/week: 0.0 standard drinks   Drug use: Not Currently    Comment: x 26 yrs.   Sexual activity: Not on file    Comment: S/P PARTIAL HYSTERECTOMY  Other Topics Concern   Not on file  Social History Narrative   Not on file   Social Determinants of Health   Financial Resource Strain: Not on file  Food Insecurity: Not on file  Transportation Needs: Not on file  Physical Activity: Not on file  Stress:  Not on file  Social Connections: Not on file     Family History: The patient's family history includes Aneurysm in her brother; Arthritis in her brother, mother, and sister; Breast cancer in her sister and sister; Diabetes in her father, mother, and sister; Heart disease in her father and mother; Hyperlipidemia in her father; Hypertension in her father, maternal aunt, maternal uncle, mother, paternal aunt, paternal uncle, sister, sister, and sister; Kidney disease in her father, paternal aunt, and paternal uncle; Mental illness in her brother; Stomach cancer in her maternal uncle; Stroke in her father. There is no history of Colon cancer, Esophageal cancer, or Pancreatic cancer.  ROS:   Please see the history of present illness.    Review of Systems  Constitutional:  Positive for malaise/fatigue. Negative  for chills and fever.  HENT:  Negative for hearing loss.   Eyes:  Negative for blurred vision and redness.  Respiratory:  Positive for shortness of breath.   Cardiovascular:  Positive for chest pain. Negative for palpitations, orthopnea, claudication, leg swelling and PND.  Gastrointestinal:  Negative for melena, nausea and vomiting.  Genitourinary:  Negative for dysuria and flank pain.  Musculoskeletal:  Negative for falls and neck pain.  Neurological:  Negative for dizziness and loss of consciousness.  Endo/Heme/Allergies:  Negative for polydipsia.  Psychiatric/Behavioral:  Negative for substance abuse.    EKGs/Labs/Other Studies Reviewed:    The following studies were reviewed today: TTE 2021/01/28: IMPRESSIONS     1. Left ventricular ejection fraction, by estimation, is 60 to 65%. The  left ventricle has normal function. The left ventricle has no regional  wall motion abnormalities. Left ventricular diastolic parameters were  normal.   2. Right ventricular systolic function is normal. The right ventricular  size is normal. There is normal pulmonary artery systolic pressure.   3. The mitral valve is normal in structure. Trivial mitral valve  regurgitation. No evidence of mitral stenosis.   4. The aortic valve is normal in structure. Aortic valve regurgitation is  not visualized. No aortic stenosis is present.   5. The inferior vena cava is normal in size with greater than 50%  respiratory variability, suggesting right atrial pressure of 3 mmHg.   Coronary CTA 12/07/20: FINDINGS: A 100 kV prospective scan was triggered in the descending thoracic aorta at 111 HU's. Axial non-contrast 3 mm slices were carried out through the heart. The data set was analyzed on a dedicated work station and scored using the Pioneer. Gantry rotation speed was 250 msecs and collimation was .6 mm. 25 mg of PO Metoprolol and 0.8 mg of sl NTG were given. The 3D data set was reconstructed in  5% intervals of the 67-82 % of the R-R cycle. Diastolic phases were analyzed on a dedicated work station using MPR, MIP and VRT modes. The patient received 80 cc of contrast.   Aorta: Normal size. Mild diffuse atherosclerotic plaque. No dissection.   Aortic Valve: Trileaflet.  Trivial calcifications.   Coronary Arteries:  Normal coronary origin.  Right dominance.   RCA is a very large dominant artery that gives rise to PDA and PLA. There is mild calcified atherosclerotic plaque in the proximal RCA with stenosis <25%, mid and distal RVA have minimal stenoses.   PDA and PLA have no significant plaque.   Left main is a large artery that gives rise to LAD and LCX arteries. Left main has no plaque.   LAD is a large vessel that gives rise to one large diagonal  artery and has no plaque.   D1 has no plaque.   LCX is a non-dominant artery that gives rise to one large OM1 branch. There is no plaque.   Other findings:   Normal pulmonary vein drainage into the left atrium.   Normal left atrial appendage without a thrombus.   Normal size of the pulmonary artery.   IMPRESSION: 1. Coronary calcium score of 90. This was 67 percentile for age and sex matched control.   2. Normal coronary origin with right dominance.   3. CAD-RADS 1. Minimal non-obstructive CAD (0-24%). Consider non-atherosclerotic causes of chest pain. Consider preventive therapy and risk factor modification.  EKG:  EKG is  ordered today.  The ekg ordered today demonstrates NSR with incomplete RBBB, HR 62  Recent Labs: 04/04/2021: ALT 29; BUN <5; Creatinine, Ser 0.85; Hemoglobin 12.9; Platelets 283; Potassium 3.9; Sodium 132 04/19/2021: TSH 1.500  Recent Lipid Panel    Component Value Date/Time   CHOL 226 (H) 01/27/2021 1044   TRIG 58 01/27/2021 1044   HDL 108 01/27/2021 1044   CHOLHDL 2.1 01/27/2021 1044   CHOLHDL 2.2 04/14/2015 1642   VLDL 52 (H) 04/14/2015 1642   LDLCALC 108 (H) 01/27/2021 1044      Risk Assessment/Calculations:       Physical Exam:    VS:  There were no vitals taken for this visit.    Wt Readings from Last 3 Encounters:  06/15/21 147 lb (66.7 kg)  06/14/21 145 lb 12.8 oz (66.1 kg)  04/19/21 145 lb 3.2 oz (65.9 kg)     GEN:  Well nourished, well developed in no acute distress HEENT: Normal NECK: No JVD; No carotid bruits CARDIAC: RRR, no murmurs, rubs, gallops RESPIRATORY:  Clear to auscultation without rales, wheezing or rhonchi  ABDOMEN: Soft, non-tender, non-distended MUSCULOSKELETAL:  No edema; No deformity  SKIN: Warm and dry NEUROLOGIC:  Alert and oriented x 3 PSYCHIATRIC:  Normal affect   ASSESSMENT:    No diagnosis found.  PLAN:    In order of problems listed above:  #Exertional Chest Pressure #DOE: Reassuring cardiac work-up with only mild RCA disease on coronary CTA with Ca score 90 (83% age, sex matched controls). TTE 12/2020 with normal BiV function with no valve disease.  #Mild Nonobstructive CAD: Ca score 90 (83%) with mild RCA disease. -Continue crestor 5mg  daily  #Asthma: -Follow-up with PCP as scheduled   Medication Adjustments/Labs and Tests Ordered: Current medicines are reviewed at length with the patient today.  Concerns regarding medicines are outlined above.  No orders of the defined types were placed in this encounter.  No orders of the defined types were placed in this encounter.   There are no Patient Instructions on file for this visit.    Signed, Freada Bergeron, MD  08/16/2021 6:05 PM    [place]

## 2021-08-17 MED ORDER — ALBUTEROL SULFATE HFA 108 (90 BASE) MCG/ACT IN AERS: 2.0000 | INHALATION_SPRAY | Freq: Four times a day (QID) | RESPIRATORY_TRACT | 2 refills | Status: DC | PRN

## 2021-08-21 ENCOUNTER — Telehealth: Payer: Self-pay | Admitting: Gastroenterology

## 2021-08-21 NOTE — Telephone Encounter (Signed)
Patient called today stating her "stomach is out of whack again."  She is having spasms, generally doesn't feel good, makes her feel sick when she eats.  She has been taking Ibgard, but it no longer seems to help her like it used to.  Please call and let her know what she should do.  Thank you.

## 2021-08-21 NOTE — Telephone Encounter (Signed)
Thanks [place] I agree with your recommendations with the following additions: Not sure if she is constipated or not, that has been 1 issue in the past and want to make sure she is moving her bowels okay in regards to the bloating.  If she is constipated let me know and we can alter her bowel regimen. Otherwise she can take the Gas-X as needed which is fine.  If she does not think the omeprazole is helping like it used to can switch to pantoprazole 40 mg once daily.  Unfortunately cannot use Levsin as she had an allergic reaction to that in the past.

## 2021-08-21 NOTE — Telephone Encounter (Signed)
Called and spoke with patient at length in regards to her concerns. Patient reports that she is still having bloating, gas and upper abdominal cramping. Pt states that her symptoms started last week after she ate a salad. Pt denies any nausea or vomiting. She states that she has been taking Omeprazole 40 mg daily 60 minutes before breakfast daily. She states that she has been taking the IB [place] with no relief. Pt states that she did not try the Gas-X. Advised pt that she can take OTC Gas-X as directed on the box. She reports that she ate kale yesterday as well. Advised patient that she should be avoiding foods that are gaseous which can worsen her symptoms. We have reviewed the foods that she should be avoiding. Pt states that she doesn't think that the Omeprazole is working anymore, she wants to try something else. She states that she feels like her stomach is raw. Pt denies any use of NSAID's, she denies smoking or alcohol use. Pt reports that she had a loose BM this morning. Please advise, thanks.

## 2021-08-22 ENCOUNTER — Ambulatory Visit (INDEPENDENT_AMBULATORY_CARE_PROVIDER_SITE_OTHER): Payer: Medicare Other | Admitting: Internal Medicine

## 2021-08-22 ENCOUNTER — Encounter: Payer: Self-pay | Admitting: Internal Medicine

## 2021-08-22 ENCOUNTER — Other Ambulatory Visit: Payer: Self-pay

## 2021-08-22 VITALS — BP 131/77 | HR 70 | Temp 97.4°F | Ht 63.0 in | Wt 147.9 lb

## 2021-08-22 DIAGNOSIS — R8271 Bacteriuria: Secondary | ICD-10-CM | POA: Diagnosis not present

## 2021-08-22 DIAGNOSIS — R350 Frequency of micturition: Secondary | ICD-10-CM

## 2021-08-22 DIAGNOSIS — F4322 Adjustment disorder with anxiety: Secondary | ICD-10-CM

## 2021-08-22 DIAGNOSIS — R1084 Generalized abdominal pain: Secondary | ICD-10-CM

## 2021-08-22 LAB — POCT URINALYSIS DIPSTICK
Bilirubin, UA: NEGATIVE
Blood, UA: NEGATIVE
Glucose, UA: NEGATIVE
Ketones, UA: NEGATIVE
Nitrite, UA: NEGATIVE
Protein, UA: NEGATIVE
Spec Grav, UA: <=1.005 — AB (ref 1.010–1.025)
Urobilinogen, UA: 0.2 E.U./dL
pH, UA: 6 (ref 5.0–8.0)

## 2021-08-22 MED ORDER — PANTOPRAZOLE SODIUM 40 MG PO TBEC: 40.0000 mg | DELAYED_RELEASE_TABLET | Freq: Every day | ORAL | 2 refills | Status: DC

## 2021-08-22 NOTE — Patient Instructions (Addendum)
Aimee Davis, it was a pleasure seeing you today! You endorsed feeling well today. Below are some of the things we talked about this visit. We look forward to seeing you in the follow up appointment!  Today we discussed: It was a pleasure to take care of you! You stated you are having abdominal cramping since 08/17/21. It has been getting worse. I believe your symptom may be secondary to your IBS flare as you are under a lot of stress. We performed a test to check for anxiety and it came back high. 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 I am attaching stress reduction exercises so you can manage the stress. 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 I am also giving you a note to work with younger kids that you stated you will be able to help the most.   I advise you to keep your follow up with GI.   I have ordered the following labs today:  Lab Orders  No laboratory test(s) ordered today      Referrals ordered today:   Referral Orders  No referral(s) requested today     I have ordered the following medication/changed the following medications:   Stop the following medications: There are no discontinued medications.   Start the following medications: No orders of the defined types were placed in this encounter.    Follow-up: 1 month follow up  Please make sure to arrive 15 minutes prior to your next appointment. If you arrive late, you may be asked to reschedule.   We look forward to seeing you next time. Please call our clinic at [phone] if you have any questions or concerns. The best time to call is Monday-Friday from 9am-4pm, but there is someone available 24/7. If after hours or the weekend, call the main hospital number and ask for the Internal Medicine Resident On-Call. If you need medication refills, please notify your pharmacy one week  in advance and they will send [place] a request.  Thank you for letting us take part in your care. Wishing you the best!  Thank you, Idamae Schuller, MD

## 2021-08-22 NOTE — Telephone Encounter (Signed)
Called and spoke with patient in regards to recommendations. Pt states that she is not constipated and feels like she is moving her bowels fine. Pt is concerned mostly about the upper abdominal cramping. Pt advised that we will switch to Pantoprazole 40 mg daily, she is aware that she will need to take this daily 30-60 minutes before breakfast. Pt reports that she is very uncomfortable and would like to be seen. Pt has been scheduled to see Alonza Bogus, PA-C on 08/23/21 at 1:30 pm. Pt verbalized understanding and had no concerns at the end of the call.   Secure staff message sent to [facility] to let her know patient was added on.

## 2021-08-22 NOTE — Progress Notes (Signed)
   CC: abdominal cramping  HPI:  [name] is a 65 y.o. with medical history as below presenting to [facility] for abdominal cramping.  Please see problem-based list for further details, assessments, and plans.  Past Medical History:  Diagnosis Date   Allergy    seasonal, PCN, antidepressants, bentyl   Arthritis    Asthma    Carpal tunnel syndrome on both sides    right worse than left 2008   Cataract 06/11/2013   COPD (chronic obstructive pulmonary disease) ([place])    COVID-19 virus infection 06/18/2016   Eczema 05/02/2018   GERD (gastroesophageal reflux disease)    Hyperlipidemia    LDL 108 JULY 2013   IBS (irritable bowel syndrome)    Macular rash 05/20/2019   Multiple nevi 03/01/2015   Pneumonia    Positive TB test    From MArch 2009 note : Recent F/u at [facility]. CXRAy negative.  Positive TST in 2007 (66mm).  Unable to tolerate INH due to hepatotoxicity   Review of Systems:  Review of system negative unless stated in the problem list or HPI.    Physical Exam:  Vitals:   08/22/21 1500 08/22/21 1510  BP: (!) 141/68 131/77  Pulse: 76 70  Temp: (!) 97.4 F (36.3 C)   TempSrc: Oral   SpO2: 99%   Weight: 147 lb 14.4 oz (67.1 kg)   Height: 5\' 3"  (1.6 m)     Physical Exam General: Well-developed, well-nourished, appearing stated age. Head: Normocephalic without scalp lesions.  Eyes: Conjunctivae pink, sclerae white, without icterus.  Mouth and Throat: MMM Neck: Neck supple with full range of motion (ROM).  Lungs: CTAB, no wheeze, rhonchi or rales.  Cardiovascular: Normal heart sounds, no r/m/g, 2+ pulses radial pulses Abdomen: TTP diffusely, no guarding, or rebound tenderness, normal bowel sounds MSK: No asymmetry or muscle atrophy. Full range of motion (ROM) of all joints. Skin: warm, dry, good skin turgor, no lesions Neuro: Alert and oriented. CN grossly intact Psych: Normal mood and normal affect   Assessment & Plan:   See Encounters Tab for problem based  charting.  Patient seen with Dr. Dollene Cleveland, MD

## 2021-08-23 ENCOUNTER — Ambulatory Visit (INDEPENDENT_AMBULATORY_CARE_PROVIDER_SITE_OTHER): Payer: Medicare Other | Admitting: Gastroenterology

## 2021-08-23 ENCOUNTER — Encounter: Payer: Self-pay | Admitting: Gastroenterology

## 2021-08-23 VITALS — BP 116/60 | HR 84 | Ht 63.5 in | Wt 148.5 lb

## 2021-08-23 DIAGNOSIS — K219 Gastro-esophageal reflux disease without esophagitis: Secondary | ICD-10-CM | POA: Diagnosis not present

## 2021-08-23 DIAGNOSIS — R1013 Epigastric pain: Secondary | ICD-10-CM | POA: Diagnosis not present

## 2021-08-23 DIAGNOSIS — F4322 Adjustment disorder with anxiety: Secondary | ICD-10-CM | POA: Insufficient documentation

## 2021-08-23 NOTE — Progress Notes (Signed)
08/23/2021 Aimee Davis [id number] 1956/09/05   HISTORY OF PRESENT ILLNESS: This is a 65 year old female is a patient of Dr. Doyne Keel.  She has history of IBS-C.  In regards to her constipation she was previously on Linzess, but she says that she had problems with it and more recently had been using MiraLAX.  MiraLAX got to the point that it was not helping so now she has been using Prunelax.  She is here today with complaints of epigastric abdominal pain and GERD.  She complains of epigastric cramping and "mucus" that she thinks is from her acid reflux.  She says that it feels worse in the mornings upon awakening.  She says that she has been having the symptoms for about the past 6 days.  She also frequently mentions that she is up at several times throughout the night to urinate and says that she has been evaluated for this by her PCP.  It appears that she was on omeprazole, but when she called with these complaints a couple of days ago Dr. Havery Moros switched this to pantoprazole 40 mg daily.  She has not yet picked that up from pharmacy.  She had a CT scan performed in July that showed an indeterminate 3 mm hypodensity over the body of the pancreas for which they were recommending MRI/MRCP or pancreatic protocol CT follow-up in 2 years.  Colonoscopy 11/2018:  - One diminutive polyp in the cecum, removed with a cold snare. Resected and retrieved. - Tortuous colon. - Internal hemorrhoids. - The examination was otherwise normal.  Tubular adenoma.  Repeat 7 years.   Past Medical History:  Diagnosis Date   Allergy    seasonal, PCN, antidepressants, bentyl   Arthritis    Asthma    Carpal tunnel syndrome on both sides    right worse than left 2008   Cataract 06/11/2013   COPD (chronic obstructive pulmonary disease) ([facility])    COVID-19 virus infection 06/18/2016   Eczema 05/02/2018   GERD (gastroesophageal reflux disease)    Hyperlipidemia    LDL 108 JULY 2013   IBS (irritable  bowel syndrome)    Macular rash 05/20/2019   Multiple nevi 03/01/2015   Pneumonia    Positive TB test    From [facility] 2009 note : Recent F/u at [facility]. CXRAy negative.  Positive TST in 2007 (77mm).  Unable to tolerate INH due to hepatotoxicity   Past Surgical History:  Procedure Laterality Date   BREAST CYST EXCISION Right 2011   BREAST EXCISIONAL BIOPSY     benign   BREAST EXCISIONAL BIOPSY Right 1990   benign   INDUCED ABORTION  2005   VAGINAL HYSTERECTOMY  1983   partial; for abnormal bleeding    reports that she quit smoking about 8 years ago. Her smoking use included cigarettes. She has a 14.19 pack-year smoking history. She has never used smokeless tobacco. She reports that she does not currently use drugs. She reports that she does not drink alcohol. family history includes Aneurysm in her brother; Arthritis in her brother, mother, and sister; Breast cancer in her sister and sister; Diabetes in her father, mother, and sister; Heart disease in her father and mother; Hyperlipidemia in her father; Hypertension in her father, maternal aunt, maternal uncle, mother, paternal aunt, paternal uncle, sister, sister, and sister; Kidney disease in her father, paternal aunt, and paternal uncle; Mental illness in her brother; Stomach cancer in her maternal uncle; Stroke in her father. Allergies  Allergen Reactions  Hydrocodone Other (See Comments)    Recovering addict X 29 years; no narcotics per patient   Lipitor [Atorvastatin] Other (See Comments)    Pt reports causes upset stomach, cramps in legs, poor appetite, and sleep disturbance   Penicillins Other (See Comments)    REACTION: rash   Levsin [Hyoscyamine] Anxiety      Outpatient Encounter Medications as of 08/23/2021  Medication Sig   albuterol (VENTOLIN HFA) 108 (90 Base) MCG/ACT inhaler Inhale 2 puffs into the lungs every 6 (six) hours as needed for wheezing or shortness of breath.   Artificial Tear Ointment (DRY EYES OP) Apply 1 drop to  eye daily as needed (dry eyes).   Ascorbic Acid (VITAMIN C) 100 MG tablet Take 50 mg by mouth daily.   aspirin EC 81 MG tablet Take 1 tablet (81 mg total) by mouth daily. Swallow whole.   B Complex-C (B-COMPLEX WITH VITAMIN C) tablet Take 1 tablet by mouth daily.   cetirizine (ZYRTEC) 10 MG tablet Take 10 mg by mouth daily.   Fluticasone-Umeclidin-Vilant (TRELEGY ELLIPTA) 200-62.5-25 MCG/INH AEPB Inhale 1 puff into the lungs daily.   Magnesium 400 MG TABS Take 2 each by mouth daily.   metoprolol tartrate (LOPRESSOR) 25 MG tablet Patient to take 1 tablet by mouth 2 hours prior to procedure   mometasone (NASONEX) 50 MCG/ACT nasal spray PLACE 2 SPRAYS IN EACH NOSTRIL ONCE DAILY AS NEEDED.   ondansetron (ZOFRAN ODT) 4 MG disintegrating tablet Take 1 tablet (4 mg total) by mouth every 8 (eight) hours as needed for nausea or vomiting.   OVER THE COUNTER MEDICATION EO MEGA Essential Oil - 2 daily   pantoprazole (PROTONIX) 40 MG tablet Take 1 tablet (40 mg total) by mouth daily. 30-60 minutes before breakfast   Peppermint Oil (IBGARD) 90 MG CPCR Take 1 capsule by mouth 2 (two) times daily as needed. Take as directed as needed   simethicone (GAS-X) 80 MG chewable tablet Use as directed as needed   [DISCONTINUED] lubiprostone (AMITIZA) 8 MCG capsule Take 1 capsule (8 mcg total) by mouth 2 (two) times daily with a meal.   [DISCONTINUED] Probiotic Product (PROBIOTIC DAILY PO) Take 1 tablet by mouth as needed (IBS flare ups).   [DISCONTINUED] rosuvastatin (CRESTOR) 5 MG tablet Take 1 tablet (5 mg total) by mouth daily.   No facility-administered encounter medications on file as of 08/23/2021.     REVIEW OF SYSTEMS  : All other systems reviewed and negative except where noted in the History of Present Illness.   PHYSICAL EXAM: BP 116/60 (BP Location: Left Arm, Patient Position: Sitting, Cuff Size: Normal)   Pulse 84   Ht 5' 3.5" (1.613 m) Comment: height measured without shoes  Wt 148 lb 8 oz (67.4 kg)    BMI 25.89 kg/m  General: Well developed AA female in no acute distress Head: Normocephalic and atraumatic Eyes:  Sclerae anicteric, conjunctiva pink. Ears: Normal auditory acuity Lungs: Clear throughout to auscultation; no W/R/R. Heart: Regular rate and rhythm; no M/R/G. Abdomen: Soft, non-distended.  BS present.  Epigastric TTP. Musculoskeletal: Symmetrical with no gross deformities  Skin: No lesions on visible extremities Extremities: No edema  Neurological: Alert oriented x 4, grossly non-focal Psychological:  Alert and cooperative. Normal mood and affect  ASSESSMENT AND PLAN: *Epigastric abdominal pain and GERD: Has not started the pantoprazole that was sent to her pharmacy a couple days ago.  I encouraged her obtain that and to begin taking that daily.  She will likely benefit from  taking it in the evenings as she tends to have more abdominal pain first and when she wakes up in the morning.  We will plan for EGD with Dr. Havery Moros to evaluate for GERD, esophagitis, ulcer, H. pylori, etc. We reviewed GERD diet and she was given literature on this.  I also encouraged her to eat small frequent meals.  The risks, benefits, and alternatives to EGD were discussed with the patient and she consents to proceed.  She had a CT scan performed in July that showed an indeterminate 3 mm hypodensity over the body of the pancreas for which they were recommending MRI/MRCP or pancreatic protocol CT follow-up in 2 years.  I doubt that this is causing her symptoms. *IBS with constipation: Previously was on Linzess, but she says that she had problems with the Linzess.  Most recently she is been taking MiraLAX and says that that is not helping so she has begun using prunelax instead and feels that that has helped her more.  CC:  Lacinda Axon, MD

## 2021-08-23 NOTE — Patient Instructions (Signed)
Continue Pantoprazole 40 mg daily 30-60 minutes before dinner.  Eat small frequent meals.   You have been scheduled for an endoscopy. Please follow written instructions given to you at your visit today. If you use inhalers (even only as needed), please bring them with you on the day of your procedure.  If you are age 67 or older, your body mass index should be between 23-30. Your Body mass index is 25.89 kg/m. If this is out of the aforementioned range listed, please consider follow up with your Primary Care Provider.  If you are age 38 or younger, your body mass index should be between 19-25. Your Body mass index is 25.89 kg/m. If this is out of the aformentioned range listed, please consider follow up with your Primary Care Provider.   ________________________________________________________  The [place] [facility] providers would like to encourage you to use [facility] to communicate with providers for non-urgent requests or questions.  Due to long hold times on the telephone, sending your provider a message by [facility] may be a faster and more efficient way to get a response.  Please allow 48 business hours for a response.  Please remember that this is for non-urgent requests.  _______________________________________________________

## 2021-08-23 NOTE — Assessment & Plan Note (Addendum)
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 These symptom appear secondary to her recent volunteer work which she states she is under both psychological and physiological stressors including waking up early, and going having to change her diet due to work demands. The symptoms were absent prior to starting this volunteer position. Her presentation appears consistent with adjustment disorder with anxiety symptoms.  -Gave patient instructions on managing stress -Continued follow up with Dr. Theodis Shove.  -Work note to work with younger children.  -Monitor for resolution

## 2021-08-24 ENCOUNTER — Encounter: Payer: Self-pay | Admitting: Internal Medicine

## 2021-08-24 ENCOUNTER — Telehealth: Payer: Self-pay

## 2021-08-24 DIAGNOSIS — R8271 Bacteriuria: Secondary | ICD-10-CM

## 2021-08-24 DIAGNOSIS — R109 Unspecified abdominal pain: Secondary | ICD-10-CM | POA: Insufficient documentation

## 2021-08-24 HISTORY — DX: Bacteriuria: R82.71

## 2021-08-24 NOTE — Progress Notes (Signed)
Agree with assessment and plan as outlined.  

## 2021-08-24 NOTE — Telephone Encounter (Signed)
Requesting lab results, please call back.  

## 2021-08-24 NOTE — Assessment & Plan Note (Addendum)
Assessment/Plan Patient with hx of IBS-C and GERD presenting with acute abdominal cramping. DDx included IBS flare vs viral gastroenteritis vs GERD. Due to her change in daily routine and increased anxiety as suggested by GAD-7, this could provoke an IBS flare. 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 Although she does not have symptoms of diarrhea, she does endorse a looser consistency than usual. GERD was considered but less likely due to the cramping nature of the pain and the generalized distribution of the pain. She has GI follow up 08/23/21. -Counseling on stress reduction -Maintaining hydration -Continued follow up Dr. 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

## 2021-08-28 NOTE — Progress Notes (Signed)
Internal Medicine Clinic Attending  I saw and evaluated the patient.  I personally confirmed the key portions of the history and exam documented by Dr. Khan and I reviewed pertinent patient test results.  The assessment, diagnosis, and plan were formulated together and I agree with the documentation in the resident's note.  

## 2021-08-29 ENCOUNTER — Other Ambulatory Visit: Payer: Self-pay

## 2021-08-29 ENCOUNTER — Encounter: Payer: Self-pay | Admitting: Cardiology

## 2021-08-29 ENCOUNTER — Ambulatory Visit (INDEPENDENT_AMBULATORY_CARE_PROVIDER_SITE_OTHER): Payer: Medicare Other | Admitting: Cardiology

## 2021-08-29 ENCOUNTER — Other Ambulatory Visit: Payer: Medicare Other | Admitting: *Deleted

## 2021-08-29 VITALS — BP 118/70 | HR 73 | Ht 63.5 in | Wt 144.2 lb

## 2021-08-29 DIAGNOSIS — Z79899 Other long term (current) drug therapy: Secondary | ICD-10-CM

## 2021-08-29 DIAGNOSIS — E7800 Pure hypercholesterolemia, unspecified: Secondary | ICD-10-CM

## 2021-08-29 DIAGNOSIS — E785 Hyperlipidemia, unspecified: Secondary | ICD-10-CM

## 2021-08-29 DIAGNOSIS — I700 Atherosclerosis of aorta: Secondary | ICD-10-CM

## 2021-08-29 DIAGNOSIS — I2510 Atherosclerotic heart disease of native coronary artery without angina pectoris: Secondary | ICD-10-CM

## 2021-08-29 DIAGNOSIS — R0609 Other forms of dyspnea: Secondary | ICD-10-CM

## 2021-08-29 DIAGNOSIS — R072 Precordial pain: Secondary | ICD-10-CM

## 2021-08-29 LAB — LIPID PANEL
Chol/HDL Ratio: 1.9 ratio (ref 0.0–4.4)
Cholesterol, Total: 210 mg/dL — ABNORMAL HIGH (ref 100–199)
HDL: 110 mg/dL (ref >39)
LDL Chol Calc (NIH): 90 mg/dL (ref 0–99)
Triglycerides: 58 mg/dL (ref 0–149)
VLDL Cholesterol Cal: 10 mg/dL (ref 5–40)

## 2021-08-29 LAB — HEPATIC FUNCTION PANEL
ALT: 26 IU/L (ref 0–32)
AST: 29 IU/L (ref 0–40)
Albumin: 4.8 g/dL (ref 3.8–4.8)
Alkaline Phosphatase: 68 IU/L (ref 44–121)
Bilirubin Total: 0.5 mg/dL (ref 0.0–1.2)
Bilirubin, Direct: 0.16 mg/dL (ref 0.00–0.40)
Total Protein: 6 g/dL (ref 6.0–8.5)

## 2021-08-29 MED ORDER — EZETIMIBE 10 MG PO TABS: 10.0000 mg | ORAL_TABLET | Freq: Every day | ORAL | 1 refills | Status: DC

## 2021-08-29 NOTE — Patient Instructions (Signed)
Medication Instructions:   START ZETIA 10 MG BY MOUTH DAILY  *If you need a refill on your cardiac medications before your next appointment, please call your pharmacy*   Lab Work:  IN 6-8 WEEKS HERE IN THE OFFICE--CHECK [place].  COME FASTING TO THAT LAB APPOINTMENT  If you have labs (blood work) drawn today and your tests are completely normal, you will receive your results only by: [place] (if you have MyChart) OR A paper copy in the mail If you have any lab test that is abnormal or we need to change your treatment, we will call you to review the results.   Follow-Up: At [facility], you and your health needs are our priority.  As part of our continuing mission to provide you with exceptional heart care, we have created designated Provider Care Teams.  These Care Teams include your primary Cardiologist (physician) and Advanced Practice Providers (APPs -  Physician Assistants and Nurse Practitioners) who all work together to provide you with the care you need, when you need it.  We recommend signing up for the patient portal called "MyChart".  Sign up information is provided on this After Visit Summary.  MyChart is used to connect with patients for Virtual Visits (Telemedicine).  Patients are able to view lab/test results, encounter notes, upcoming appointments, etc.  Non-urgent messages can be sent to your provider as well.   To learn more about what you can do with MyChart, go to [email or web address].    Your next appointment:   6 month(s)  The format for your next appointment:   In Person  Provider:   Freada Bergeron, MD {

## 2021-08-29 NOTE — Progress Notes (Signed)
Cardiology Office Note:    Date:  08/29/2021   ID:  Aimee Davis, DOB 08-14-1956, MRN [id number]  PCP:  Lacinda Axon, MD   [place]  Cardiologist:  Freada Bergeron, MD  Advanced Practice Provider:  No care team member to display Electrophysiologist:  None   Referring MD: Lacinda Axon, MD    History of Present Illness:    Aimee Davis is a 65 y.o. female with a hx of chronic fatigue, GERD and asthma who presents for follow-up.   Patient last seen in clinic where she was being seen for DOE and chest pain. Coronary CTA 11/2020 with minimal disease in RCA. Ca score 90 (83% for age and sex matched control). TTE 12/2020 with LVEF 60-65%, normal RV, no significant valve disease. It was deemed her symptoms were not cardiac in nature.  Today, she reports she is doing well but having problems with her IBS. She is planned for endoscopy with GI later this week.  Otherwise, she is still experiencing shortness of breath and has to use her second inhaler more often now. This has worsened with the change in the weather. No chest pain, lightheadedness or dizziness.   Of note, her LDL was 108 but she could not tolerate Lipitor due to muscle cramps.  She is maintaining a healthy diet. She is worried about starting any new meds due to her stomach problems.  She denies chest pain, palpitations, lightheadedness, headaches, syncope, LE edema, orthopnea, PND.    Family History: Father with CAD with 2 Mis; sister with CAD, ESRD (age 67), mother HTN, sister HTN  Past Medical History:  Diagnosis Date   Abdominal pain 08/16/2008   Qualifier: Diagnosis of  By: Hassell Done FNP, [name]     Abdominal pain, epigastric 09/08/2019   Allergy    seasonal, PCN, antidepressants, bentyl   Arthritis    Asthma    Carpal tunnel syndrome on both sides    right worse than left 2008   Cataract 06/11/2013   COPD (chronic obstructive pulmonary disease) ([place])    COVID-19  virus infection 06/18/2016   Eczema 05/02/2018   GERD (gastroesophageal reflux disease)    Hyperlipidemia    LDL 108 JULY 2013   IBS (irritable bowel syndrome)    Macular rash 05/20/2019   Multiple nevi 03/01/2015   Pneumonia    Positive TB test    From MArch 2009 note : Recent F/u at [facility]. CXRAy negative.  Positive TST in 2007 (8mm).  Unable to tolerate INH due to hepatotoxicity    Past Surgical History:  Procedure Laterality Date   BREAST CYST EXCISION Right 2011   BREAST EXCISIONAL BIOPSY     benign   BREAST EXCISIONAL BIOPSY Right 1990   benign   INDUCED ABORTION  2005   VAGINAL HYSTERECTOMY  1983   partial; for abnormal bleeding    Current Medications: Current Meds  Medication Sig   albuterol (VENTOLIN HFA) 108 (90 Base) MCG/ACT inhaler Inhale 2 puffs into the lungs every 6 (six) hours as needed for wheezing or shortness of breath.   Artificial Tear Ointment (DRY EYES OP) Apply 1 drop to eye daily as needed (dry eyes).   Ascorbic Acid (VITAMIN C) 100 MG tablet Take 50 mg by mouth daily.   aspirin EC 81 MG tablet Take 1 tablet (81 mg total) by mouth daily. Swallow whole.   B Complex-C (B-COMPLEX WITH VITAMIN C) tablet Take 1 tablet by mouth daily.  cetirizine (ZYRTEC) 10 MG tablet Take 10 mg by mouth daily.   ezetimibe (ZETIA) 10 MG tablet Take 1 tablet (10 mg total) by mouth daily.   Fluticasone-Umeclidin-Vilant (TRELEGY ELLIPTA) 200-62.5-25 MCG/INH AEPB Inhale 1 puff into the lungs daily.   Magnesium 400 MG TABS Take 2 each by mouth daily.   metoprolol tartrate (LOPRESSOR) 25 MG tablet Patient to take 1 tablet by mouth 2 hours prior to procedure   mometasone (NASONEX) 50 MCG/ACT nasal spray PLACE 2 SPRAYS IN EACH NOSTRIL ONCE DAILY AS NEEDED.   ondansetron (ZOFRAN ODT) 4 MG disintegrating tablet Take 1 tablet (4 mg total) by mouth every 8 (eight) hours as needed for nausea or vomiting.   OVER THE COUNTER MEDICATION EO MEGA Essential Oil - 2 daily   pantoprazole (PROTONIX)  40 MG tablet Take 1 tablet (40 mg total) by mouth daily. 30-60 minutes before breakfast   Peppermint Oil (IBGARD) 90 MG CPCR Take 1 capsule by mouth 2 (two) times daily as needed. Take as directed as needed   simethicone (GAS-X) 80 MG chewable tablet Use as directed as needed     Allergies:   Hydrocodone, Lipitor [atorvastatin], Penicillins, and Levsin [hyoscyamine]   Social History   Socioeconomic History   Marital status: Divorced    Spouse name: Not on file   Number of children: 2   Years of education: Not on file   Highest education level: Not on file  Occupational History   Occupation: retired  Tobacco Use   Smoking status: Former    Packs/day: 0.33    Years: 43.00    Pack years: 14.19    Types: Cigarettes    Quit date: 01/06/2013    Years since quitting: 8.6   Smokeless tobacco: Never  Vaping Use   Vaping Use: Never used  Substance and Sexual Activity   Alcohol use: No    Alcohol/week: 0.0 standard drinks   Drug use: Not Currently    Comment: x 26 yrs.   Sexual activity: Not on file    Comment: S/P PARTIAL HYSTERECTOMY  Other Topics Concern   Not on file  Social History Narrative   Not on file   Social Determinants of Health   Financial Resource Strain: Not on file  Food Insecurity: Not on file  Transportation Needs: Not on file  Physical Activity: Not on file  Stress: Not on file  Social Connections: Not on file     Family History: The patient's family history includes Aneurysm in her brother; Arthritis in her brother, mother, and sister; Breast cancer in her sister and sister; Diabetes in her father, mother, and sister; Heart disease in her father and mother; Hyperlipidemia in her father; Hypertension in her father, maternal aunt, maternal uncle, mother, paternal aunt, paternal uncle, sister, sister, and sister; Kidney disease in her father, paternal aunt, and paternal uncle; Mental illness in her brother; Stomach cancer in her maternal uncle; Stroke in her  father. There is no history of Colon cancer, Esophageal cancer, or Pancreatic cancer.  ROS:   Please see the history of present illness.    Review of Systems  Constitutional:  Negative for chills, fever and malaise/fatigue.  HENT:  Negative for hearing loss.   Eyes:  Negative for blurred vision and redness.  Respiratory:  Positive for shortness of breath.   Cardiovascular:  Negative for chest pain, palpitations, orthopnea, claudication, leg swelling and PND.  Gastrointestinal:  Positive for constipation. Negative for melena, nausea and vomiting.  Genitourinary:  Negative for  dysuria and flank pain.  Musculoskeletal:  Negative for falls and neck pain.  Neurological:  Negative for dizziness and loss of consciousness.  Endo/Heme/Allergies:  Negative for polydipsia.  Psychiatric/Behavioral:  Negative for substance abuse.    EKGs/Labs/Other Studies Reviewed:    The following studies were reviewed today:   TTE 12/2020: IMPRESSIONS   1. Left ventricular ejection fraction, by estimation, is 60 to 65%. The  left ventricle has normal function. The left ventricle has no regional  wall motion abnormalities. Left ventricular diastolic parameters were  normal.   2. Right ventricular systolic function is normal. The right ventricular  size is normal. There is normal pulmonary artery systolic pressure.   3. The mitral valve is normal in structure. Trivial mitral valve  regurgitation. No evidence of mitral stenosis.   4. The aortic valve is normal in structure. Aortic valve regurgitation is  not visualized. No aortic stenosis is present.   5. The inferior vena cava is normal in size with greater than 50%  respiratory variability, suggesting right atrial pressure of 3 mmHg.   Coronary CTA 12/07/20: FINDINGS: A 100 kV prospective scan was triggered in the descending thoracic aorta at 111 HU's. Axial non-contrast 3 mm slices were carried out through the heart. The data set was analyzed on a  dedicated work station and scored using the [place]. Gantry rotation speed was 250 msecs and collimation was .6 mm. 25 mg of PO Metoprolol and 0.8 mg of sl NTG were given. The 3D data set was reconstructed in 5% intervals of the 67-82 % of the R-R cycle. Diastolic phases were analyzed on a dedicated work station using MPR, MIP and VRT modes. The patient received 80 cc of contrast.   Aorta: Normal size. Mild diffuse atherosclerotic plaque. No dissection.   Aortic Valve: Trileaflet.  Trivial calcifications.   Coronary Arteries:  Normal coronary origin.  Right dominance.   RCA is a very large dominant artery that gives rise to PDA and PLA. There is mild calcified atherosclerotic plaque in the proximal RCA with stenosis <25%, mid and distal RVA have minimal stenoses.   PDA and PLA have no significant plaque.   Left main is a large artery that gives rise to LAD and LCX arteries. Left main has no plaque.   LAD is a large vessel that gives rise to one large diagonal artery and has no plaque.   D1 has no plaque.   LCX is a non-dominant artery that gives rise to one large OM1 branch. There is no plaque.   Other findings:   Normal pulmonary vein drainage into the left atrium.   Normal left atrial appendage without a thrombus.   Normal size of the pulmonary artery.   IMPRESSION: 1. Coronary calcium score of 90. This was 73 percentile for age and sex matched control.   2. Normal coronary origin with right dominance.   3. CAD-RADS 1. Minimal non-obstructive CAD (0-24%). Consider non-atherosclerotic causes of chest pain. Consider preventive therapy and risk factor modification.  EKG:    03/27/2021:  The ekg ordered today demonstrates NSR with incomplete RBBB, HR 62  Recent Labs: 04/04/2021: ALT 29; BUN <5; Creatinine, Ser 0.85; Hemoglobin 12.9; Platelets 283; Potassium 3.9; Sodium 132 04/19/2021: TSH 1.500  Recent Lipid Panel    Component Value Date/Time   CHOL 226 (H)  01/27/2021 1044   TRIG 58 01/27/2021 1044   HDL 108 01/27/2021 1044   CHOLHDL 2.1 01/27/2021 1044   CHOLHDL 2.2 04/14/2015 1642  VLDL 52 (H) 04/14/2015 1642   LDLCALC 108 (H) 01/27/2021 1044     Risk Assessment/Calculations:       Physical Exam:    VS:  BP 118/70   Pulse 73   Ht 5' 3.5" (1.613 m)   Wt 144 lb 3.2 oz (65.4 kg)   SpO2 97%   BMI 25.14 kg/m     Wt Readings from Last 3 Encounters:  08/29/21 144 lb 3.2 oz (65.4 kg)  08/23/21 148 lb 8 oz (67.4 kg)  08/22/21 147 lb 14.4 oz (67.1 kg)     GEN:  Well nourished, well developed in no acute distress HEENT: Normal NECK: No JVD; No carotid bruits CARDIAC: RRR, no murmurs, rubs, gallops RESPIRATORY:  Clear to auscultation without rales, wheezing or rhonchi  ABDOMEN: Soft, non-tender, non-distended MUSCULOSKELETAL:  No edema; No deformity  SKIN: Warm and dry NEUROLOGIC:  Alert and oriented x 3 PSYCHIATRIC:  Normal affect   ASSESSMENT:    1. Coronary artery disease involving native coronary artery of native heart without angina pectoris   2. Medication management   3. Pure hypercholesterolemia   4. Hyperlipidemia, unspecified hyperlipidemia type   5. Dyspnea on exertion   6. Precordial pain    PLAN:    In order of problems listed above:  #Exertional Chest Pressure #DOE: Reassuring cardiac work-up with only mild RCA disease on coronary CTA with Ca score 90 (83% age, sex matched controls). TTE 12/2020 with normal BiV function with no valve disease. Likely related to underlying asthma. -Management of asthma per primary -Reassuring cardiac work-up  #Mild Nonobstructive CAD: #HLD: #Statin Intolerance: Ca score 90 (83%) with mild RCA disease. Unable to tolerate crestor, lipitor and simvastatin. Goal LDL <80. Declined PCSK9i as does not want to do injections. -Start zetia 10mg  daily -Repeat lipids in 6-8 weeks  #Asthma: -Follow-up with PCP as scheduled  Follow-up in 6 months  Medication Adjustments/Labs  and Tests Ordered: Current medicines are reviewed at length with the patient today.  Concerns regarding medicines are outlined above.  Orders Placed This Encounter  Procedures   Lipid Profile   Meds ordered this encounter  Medications   ezetimibe (ZETIA) 10 MG tablet    Sig: Take 1 tablet (10 mg total) by mouth daily.    Dispense:  90 tablet    Refill:  1    Patient Instructions  Medication Instructions:   START ZETIA 10 MG BY MOUTH DAILY  *If you need a refill on your cardiac medications before your next appointment, please call your pharmacy*   Lab Work:  IN 6-8 WEEKS HERE IN THE OFFICE--CHECK [place].  COME FASTING TO THAT LAB APPOINTMENT  If you have labs (blood work) drawn today and your tests are completely normal, you will receive your results only by: [place] (if you have MyChart) OR A paper copy in the mail If you have any lab test that is abnormal or we need to change your treatment, we will call you to review the results.   Follow-Up: At [facility], you and your health needs are our priority.  As part of our continuing mission to provide you with exceptional heart care, we have created designated Provider Care Teams.  These Care Teams include your primary Cardiologist (physician) and Advanced Practice Providers (APPs -  Physician Assistants and Nurse Practitioners) who all work together to provide you with the care you need, when you need it.  We recommend signing up for the patient portal called "  MyChart".  Sign up information is provided on this After Visit Summary.  MyChart is used to connect with patients for Virtual Visits (Telemedicine).  Patients are able to view lab/test results, encounter notes, upcoming appointments, etc.  Non-urgent messages can be sent to your provider as well.   To learn more about what you can do with MyChart, go to [email or web address].    Your next appointment:   6 month(s)  The format for your next  appointment:   In Person  Provider:   Freada Bergeron, MD {    I,Zite Okoli,acting as a scribe for Freada Bergeron, MD.,have documented all relevant documentation on the behalf of Freada Bergeron, MD,as directed by  Freada Bergeron, MD while in the presence of Freada Bergeron, MD.   I, Freada Bergeron, MD, have reviewed all documentation for this visit. The documentation on 08/29/21 for the exam, diagnosis, procedures, and orders are all accurate and complete.   Signed, Freada Bergeron, MD  08/29/2021 10:09 AM    [place]

## 2021-08-31 ENCOUNTER — Telehealth: Payer: Self-pay | Admitting: Gastroenterology

## 2021-08-31 NOTE — Telephone Encounter (Signed)
I returned call to the pt and she tells me that she has excruciating pain in the abd. She reports 9/10 constant pain ever since waking up this morning.  She tells me she is unable to stand up for any amount of time.  She can only lay in the bed.  She was last seen on 08/23/21 for the same.  She has been set up for EGD with Dr Havery Moros on 12/21. She is taking pantoprazole 40 mg daily.  She was advised to see urgent care or ED for evaluation for the severe abd pain.  Fernand Parkins

## 2021-08-31 NOTE — Telephone Encounter (Signed)
Inbound call from patient. States she have been experiencing abd pain that started when she woke up. Is asking what she should do?

## 2021-09-06 ENCOUNTER — Encounter: Payer: Self-pay | Admitting: Gastroenterology

## 2021-09-06 ENCOUNTER — Ambulatory Visit: Payer: Medicare Other | Admitting: Behavioral Health

## 2021-09-06 ENCOUNTER — Other Ambulatory Visit: Payer: Self-pay

## 2021-09-06 ENCOUNTER — Ambulatory Visit (AMBULATORY_SURGERY_CENTER): Payer: Medicare Other | Admitting: Gastroenterology

## 2021-09-06 VITALS — BP 104/65 | HR 74 | Temp 97.6°F | Resp 12 | Ht 63.5 in | Wt 144.0 lb

## 2021-09-06 DIAGNOSIS — R1013 Epigastric pain: Secondary | ICD-10-CM

## 2021-09-06 DIAGNOSIS — K219 Gastro-esophageal reflux disease without esophagitis: Secondary | ICD-10-CM

## 2021-09-06 DIAGNOSIS — K319 Disease of stomach and duodenum, unspecified: Secondary | ICD-10-CM

## 2021-09-06 DIAGNOSIS — F419 Anxiety disorder, unspecified: Secondary | ICD-10-CM

## 2021-09-06 DIAGNOSIS — F332 Major depressive disorder, recurrent severe without psychotic features: Secondary | ICD-10-CM

## 2021-09-06 MED ORDER — SODIUM CHLORIDE 0.9 % IV SOLN: 500.0000 mL | Freq: Once | INTRAVENOUS | Status: DC

## 2021-09-06 NOTE — Op Note (Signed)
[facility] Patient Name: Aimee Davis Procedure Date: 09/06/2021 9:15 AM MRN: [id number] Endoscopist: Viviann Spare P. Adela Lank , MD Age: 65 Referring MD:  Date of Birth: 03-18-1956 Gender: Female Account #: [id number] Procedure:                Upper GI endoscopy Indications:              Epigastric abdominal pain, follow-up                            gastro-esophageal reflux disease - have recommended                            trial of protonix recently but she has not taken it                            routinely other than a one time dose Medicines:                Monitored Anesthesia Care Procedure:                Pre-Anesthesia Assessment:                           - Prior to the procedure, a History and Physical                            was performed, and patient medications and                            allergies were reviewed. The patient's tolerance of                            previous anesthesia was also reviewed. The risks                            and benefits of the procedure and the sedation                            options and risks were discussed with the patient.                            All questions were answered, and informed consent                            was obtained. Prior Anticoagulants: The patient has                            taken no previous anticoagulant or antiplatelet                            agents. ASA Grade Assessment: III - A patient with                            severe systemic disease. After reviewing the risks  and benefits, the patient was deemed in                            satisfactory condition to undergo the procedure.                           After obtaining informed consent, the endoscope was                            passed under direct vision. Throughout the                            procedure, the patient's blood pressure, pulse, and                            oxygen saturations  were monitored continuously. The                            Endoscope was introduced through the mouth, and                            advanced to the second part of duodenum. The upper                            GI endoscopy was accomplished without difficulty.                            The patient tolerated the procedure well. Scope In: Scope Out: Findings:                 Esophagogastric landmarks were identified: the                            Z-line was found at 38 cm, the gastroesophageal                            junction was found at 38 cm and the upper extent of                            the gastric folds was found at 38 cm from the                            incisors.                           The exam of the esophagus was otherwise normal.                           The entire examined stomach was normal. Biopsies                            were taken with a cold forceps for Helicobacter  pylori testing.                           Diffuse mildly erythematous mucosa was found in the                            duodenal bulb and in the second portion of the                            duodenum. Biopsies were taken with a cold forceps                            for histology.                           The exam of the duodenum was otherwise normal. Complications:            No immediate complications. Estimated blood loss:                            Minimal. Estimated Blood Loss:     Estimated blood loss was minimal. Impression:               - Esophagogastric landmarks identified.                           - Normal esophagus.                           - Normal stomach. Biopsied to rule out H pylori.                           - Erythematous duodenum - mild. Biopsied. Otherwise                            normal duodenum. Recommendation:           - Patient has a contact number available for                            emergencies. The signs and symptoms of  potential                            delayed complications were discussed with the                            patient. Return to normal activities tomorrow.                            Written discharge instructions were provided to the                            patient.                           - Resume previous diet.                           -  Continue present medications.                           - Trial of protonix 40mg  daily for 4 weeks as                            previously recommended                           - Await pathology results with further                            recommendations Viviann Spare P. Jakhai Fant, MD 09/06/2021 9:35:42 AM This report has been signed electronically.

## 2021-09-06 NOTE — Progress Notes (Signed)
To PACU< VSS. Report to [email or web address] 

## 2021-09-06 NOTE — Progress Notes (Signed)
VS by CW  Pt's states no medical or surgical changes since previsit or office visit.  

## 2021-09-06 NOTE — BH Specialist Note (Signed)
Integrated Behavioral Health via Telemedicine Visit  09/06/2021 Aimee Davis [id number]  Number of [place] visits: 2/6 Session Start time: 2:20pm  Session End time: 2:50pm Total time: 30  Referring Provider: Almeta Monas., RN Patient/Family location: Pt is home after Endoscopy procedure w/Biopsy today [facility] Provider location: [facility] Office  All persons participating in visit: Pt & Clinician Types of Service: Individual psychotherapy  I connected with [name] and/or Tarissa C Gosselin's  self  via  Telephone or Geologist, engineering  (Video is Tree surgeon) and verified that I am speaking with the correct person using two identifiers. Discussed confidentiality:  2/6  I discussed the limitations of telemedicine and the availability of in person appointments.  Discussed there is a possibility of technology failure and discussed alternative modes of communication if that failure occurs.  I discussed that engaging in this telemedicine visit, they consent to the provision of behavioral healthcare and the services will be billed under their insurance.  Patient and/or legal guardian expressed understanding and consented to Telemedicine visit:  2/6  Presenting Concerns: Patient and/or family reports the following symptoms/concerns: elevated feelings of dep & loneliness Duration of problem: this week; Severity of problem: moderate  Patient and/or Family's Strengths/Protective Factors: Social and Emotional competence, Concrete supports in place (healthy food, safe environments, etc.), and Physical Health (exercise, healthy diet, medication compliance, etc.)  Goals Addressed: Patient will:  Reduce symptoms of: anxiety, depression, stress, and needs f:f contact w/concerned family & friends instead of calling    Increase knowledge and/or ability of: coping skills, healthy habits, and stress reduction   Demonstrate ability to: Increase healthy  adjustment to current life circumstances  Progress towards Goals: Ongoing  Interventions: Interventions utilized:  Solution-Focused Strategies, Supportive Counseling, and Psychoeducation and/or Health Education Standardized Assessments completed:  screeners prn  Patient and/or Family Response: Pt receptive to call today & requests future appt  Assessment: Patient currently experiencing elevated anx/dep & stress due to lack of contact w/others. Her job w/65yo's @ [employer]. The structure was awkward & Pt was challenged-this was not satisfying for her goals to help the children.  Patient may benefit from revising her plans for helping the chldren @ [place]. The stress of the situation caused her IBS to flare. She is uncertain she wants to continue.  Plan: Follow up with behavioral health clinician on : 2-3 wks on telehealth for 30 min ck-in Behavioral recommendations: Use the tools discussed in session. Referral(s): [place] (In Clinic)  I discussed the assessment and treatment plan with the patient and/or parent/guardian. They were provided an opportunity to ask questions and all were answered. They agreed with the plan and demonstrated an understanding of the instructions.   They were advised to call back or seek an in-person evaluation if the symptoms worsen or if the condition fails to improve as anticipated.  Donnetta Hutching, LMFT

## 2021-09-06 NOTE — Progress Notes (Signed)
History and Physical Interval Note: See note from 08/23/21 for full details. No interval changes. Ongoing intermittent epigastric pain, history of GERD. We have prescribed 2 different PPIs but she has not really taken either of them, states she was confused about the medication. Symptoms persist. EGD to further evaluate, no prior upper endoscopy. She wishes to proceed.  09/06/2021 9:17 AM  Aimee Davis  has presented today for endoscopic procedure(s), with the diagnosis of  Encounter Diagnosis  Name Primary?   Abdominal pain, epigastric Yes  .  The various methods of evaluation and treatment have been discussed with the patient and/or family. After consideration of risks, benefits and other options for treatment, the patient has consented to  the endoscopic procedure(s).   The patient's history has been reviewed, patient examined, no change in status, stable for surgery.  I have reviewed the patient's chart and labs.  Questions were answered to the patient's satisfaction.    Jolly Mango, MD [facility]

## 2021-09-06 NOTE — Patient Instructions (Addendum)
Trial of Protonix 40mg  daily for 4 weeks as previously recommended.  Handout on GERD given.  YOU HAD AN ENDOSCOPIC PROCEDURE TODAY AT [facility]:   Refer to the procedure report that was given to you for any specific questions about what was found during the examination.  If the procedure report does not answer your questions, please call your gastroenterologist to clarify.  If you requested that your care partner not be given the details of your procedure findings, then the procedure report has been included in a sealed envelope for you to review at your convenience later.  YOU SHOULD EXPECT: Some feelings of bloating in the abdomen. Passage of more gas than usual.  Walking can help get rid of the air that was put into your GI tract during the procedure and reduce the bloating. If you had a lower endoscopy (such as a colonoscopy or flexible sigmoidoscopy) you may notice spotting of blood in your stool or on the toilet paper. If you underwent a bowel prep for your procedure, you may not have a normal bowel movement for a few days.  Please Note:  You might notice some irritation and congestion in your nose or some drainage.  This is from the oxygen used during your procedure.  There is no need for concern and it should clear up in a day or so.  SYMPTOMS TO REPORT IMMEDIATELY:   Following upper endoscopy (EGD)  Vomiting of blood or coffee ground material  New chest pain or pain under the shoulder blades  Painful or persistently difficult swallowing  New shortness of breath  Fever of 100F or higher  Black, tarry-looking stools  For urgent or emergent issues, a gastroenterologist can be reached at any hour by calling [phone]. Do not use MyChart messaging for urgent concerns.    DIET:  We do recommend a small meal at first, but then you may proceed to your regular diet.  Drink plenty of fluids but you should avoid alcoholic beverages for 24 hours.  ACTIVITY:  You  should plan to take it easy for the rest of today and you should NOT DRIVE or use heavy machinery until tomorrow (because of the sedation medicines used during the test).    FOLLOW UP: Our staff will call the number listed on your records 48-72 hours following your procedure to check on you and address any questions or concerns that you may have regarding the information given to you following your procedure. If we do not reach you, we will leave a message.  We will attempt to reach you two times.  During this call, we will ask if you have developed any symptoms of COVID 19. If you develop any symptoms (ie: fever, flu-like symptoms, shortness of breath, cough etc.) before then, please call [phone].  If you test positive for Covid 19 in the 2 weeks post procedure, please call and report this information to [place].    If any biopsies were taken you will be contacted by phone or by letter within the next 1-3 weeks.  Please call us at [phone] if you have not heard about the biopsies in 3 weeks.    SIGNATURES/CONFIDENTIALITY: You and/or your care partner have signed paperwork which will be entered into your electronic medical record.  These signatures attest to the fact that that the information above on your After Visit Summary has been reviewed and is understood.  Full responsibility of the confidentiality of this discharge information lies with you  and/or your care-partner.  °

## 2021-09-06 NOTE — Progress Notes (Signed)
Called to room to assist during endoscopic procedure.  Patient ID and intended procedure confirmed with present staff. Received instructions for my participation in the procedure from the performing physician.  

## 2021-09-08 ENCOUNTER — Encounter: Payer: Self-pay | Admitting: Gastroenterology

## 2021-09-08 ENCOUNTER — Telehealth: Payer: Self-pay

## 2021-09-08 NOTE — Telephone Encounter (Signed)
°  Follow up Call-  Call back number 09/06/2021  Post procedure Call Back phone  # [phone]  Permission to leave phone message Yes  Some recent data might be hidden     Patient questions:  Do you have a fever, pain , or abdominal swelling? No. Pain Score  0 *  Have you tolerated food without any problems? Yes.    Have you been able to return to your normal activities? Yes.    Do you have any questions about your discharge instructions: Diet   No. Medications  No. Follow up visit  No.  Do you have questions or concerns about your Care? No.  Actions: * If pain score is 4 or above: No action needed, pain <4.  Have you developed a fever since your procedure? no  2.   Have you had an respiratory symptoms (SOB or cough) since your procedure? no  3.   Have you tested positive for COVID 19 since your procedure no  4.   Have you had any family members/close contacts diagnosed with the COVID 19 since your procedure?  no   If yes to any of these questions please route to Joylene John, RN and Joella Prince, RN

## 2021-09-13 ENCOUNTER — Ambulatory Visit: Payer: Medicare Other | Admitting: Behavioral Health

## 2021-09-13 ENCOUNTER — Telehealth: Payer: Self-pay | Admitting: Internal Medicine

## 2021-09-13 ENCOUNTER — Telehealth: Payer: Self-pay | Admitting: *Deleted

## 2021-09-13 DIAGNOSIS — F419 Anxiety disorder, unspecified: Secondary | ICD-10-CM

## 2021-09-13 DIAGNOSIS — F331 Major depressive disorder, recurrent, moderate: Secondary | ICD-10-CM

## 2021-09-13 NOTE — BH Specialist Note (Addendum)
Integrated Behavioral Health via Telemedicine Visit  09/13/2021 Aimee Davis [id number]  Number of [place] visits: 3/6 Session Start time: 3:00pm  Session End time: 3:30pm Total time: 30  Referring Provider: Almeta Monas., RN Patient/Family location: Pt is @ her Mom's home in private [facility] Provider location: [facility] All persons participating in visit: Pt & Clinician Types of Service: Individual psychotherapy  I connected with Aimee Davis and/or Aimee Davis's  self3rd  via  Telephone or Geologist, engineering  (Video is Tree surgeon) and verified that I am speaking with the correct person using two identifiers. Discussed confidentiality:  3rd visit  I discussed the limitations of telemedicine and the availability of in person appointments.  Discussed there is a possibility of technology failure and discussed alternative modes of communication if that failure occurs.  I discussed that engaging in this telemedicine visit, they consent to the provision of behavioral healthcare and the services will be billed under their insurance.  Patient and/or legal guardian expressed understanding and consented to Telemedicine visit:  3rd visit  Presenting Concerns: Patient and/or family reports the following symptoms/concerns: elevated anx/dep due to inc in worry & concern for being alone in her own home. Pt has been w/her Mother since Christmas Eve. She has high anxiety for returning to her home.  Pt needs to cont her NA Mtg attendance, find a suitable venue for Volunteering w/her time.  Duration of problem: months; Severity of problem: moderate  Patient and/or Family's Strengths/Protective Factors: Concrete supports in place (healthy food, safe environments, etc.) and Sense of purpose needs strengthening  Goals Addressed: Patient will:  Reduce symptoms of: anxiety, depression, and loneliness    Increase knowledge and/or ability of: coping  skills, stress reduction, and speaking to G_d in the strength of her faith    Demonstrate ability to: Increase healthy adjustment to current life circumstances Explore Step 4 w/your new Sponsor. Your past may be keeping you stuck. Work on Scientist, research (life sciences) life using Event organiser by Terance Ice.  Progress towards Goals: Ongoing  Interventions: Interventions utilized:  Supportive Counseling and Supportive Reflection Standardized Assessments completed:  screeners prn  Patient and/or Family Response: Pt receptive & requesting call today from Clinician. Pt will attend next session on 0117/2023.  Assessment: Patient currently experiencing elevated anx/dep due to loneliness & extreme hesitation accepting her road to recovery from her COVID-19 aftermath.   Patient may benefit from cont'd sessions to monitor progress & concerns.  Plan: Follow up with behavioral health clinician on : Next: 10/03/2021 @ 2:00pm. Behavioral recommendations: Cont to write, do puzzles & brain teasers-keep active around ppl. Referral(s): [place] (In Clinic)  I discussed the assessment and treatment plan with the patient and/or parent/guardian. They were provided an opportunity to ask questions and all were answered. They agreed with the plan and demonstrated an understanding of the instructions.   They were advised to call back or seek an in-person evaluation if the symptoms worsen or if the condition fails to improve as anticipated.  Donnetta Hutching, LMFT

## 2021-09-13 NOTE — Telephone Encounter (Signed)
Call from patient has issue with her throat is scratchy and has some coughing.  White mucous . No fevers.  Started last night.  Burns a little. No problem swallowing.  Feels uncomfortable.  Hurts to cough. Is not taking anything for at this time.  Wants to also speak with Dr. Theodis Shove who was contacted and will call her this afternoon.

## 2021-09-13 NOTE — Telephone Encounter (Signed)
°  [facility] Telephone Encounter Continuity Care Appointment  HPI:  This telephone encounter was created for [name] on 09/13/2021 for the following purpose/cc throat irritation and "feeling rough".    Past Medical History:  Past Medical History:  Diagnosis Date   Abdominal pain 08/16/2008   Qualifier: Diagnosis of  By: Hassell Done FNP, [name]     Abdominal pain, epigastric 09/08/2019   Allergy    seasonal, PCN, antidepressants, bentyl   Arthritis    Asthma    Carpal tunnel syndrome on both sides    right worse than left 2008   Cataract 06/11/2013   COPD (chronic obstructive pulmonary disease) ([place])    COVID-19 virus infection 06/18/2016   Eczema 05/02/2018   GERD (gastroesophageal reflux disease)    Hyperlipidemia    LDL 108 JULY 2013   IBS (irritable bowel syndrome)    Macular rash 05/20/2019   Multiple nevi 03/01/2015   Pneumonia    Positive TB test    From [facility] 2009 note : Recent F/u at [facility]. CXRAy negative.  Positive TST in 2007 (54mm).  Unable to tolerate INH due to hepatotoxicity     ROS:  Negative except as stated in HPI   Assessment / Plan / Recommendations:  Please see A&P under problem oriented charting for assessment of the patient's acute and chronic medical conditions.  As always, pt is advised that if symptoms worsen or new symptoms arise, they should go to an urgent care facility or to to ER for further evaluation.   Consent and Medical Decision Making:   This is a telephone encounter between Aimee Davis and Aimee Davis on 09/13/2021 for sore throat. The visit was conducted with the patient located at home and Aimee Davis at [facility]. The patient's identity was confirmed using their DOB and current address. The patient has consented to being evaluated through a telephone encounter and understands the associated risks (an examination cannot be done and the patient may need to come in for an appointment) / benefits (allows  the patient to remain at home, decreasing exposure to coronavirus). I personally spent 10 minutes on medical discussion.    Ms Aimee Davis reports that she has been "feeling rough" since last last night. She reports that this initially started as a sore throat with a productive cough but has since progressed to a dry cough with intermittent chest pain. She reports that it feels uncomfortable. Denies any fevers or chills. She has not taken anything for this. She has received three vaccines against COVID. She has been at her mother's for Christmas and notes that her sister was experiencing similar symptoms. I suspect she has viral illness with atypical chest pain exacerbated by cough. Patient advised for supportive treatments. However, if chest pain persists or worsens, advised to go to urgent care. She expresses understanding.

## 2021-09-15 ENCOUNTER — Telehealth: Payer: Self-pay

## 2021-09-15 NOTE — Telephone Encounter (Signed)
Pt called in today to review her pathology results. I read the letter that was mailed to her. Pt states that she started the Protonix the day of her procedure, advised pt to continue the medication if it helps. I told her to call us back if she does not see any improvement in the next couple of weeks. Pt is aware that she should receive the letter in the mail shortly. Pt verbalized understanding and had no concerns at the end of the call.

## 2021-09-21 ENCOUNTER — Ambulatory Visit (HOSPITAL_COMMUNITY)
Admission: EM | Admit: 2021-09-21 | Discharge: 2021-09-21 | Disposition: A | Discharge status: Home / Self Care | Payer: Medicare Other | Attending: Student | Admitting: Student

## 2021-09-21 ENCOUNTER — Encounter (HOSPITAL_COMMUNITY): Payer: Self-pay

## 2021-09-21 ENCOUNTER — Other Ambulatory Visit: Payer: Self-pay

## 2021-09-21 DIAGNOSIS — J069 Acute upper respiratory infection, unspecified: Secondary | ICD-10-CM | POA: Diagnosis not present

## 2021-09-21 DIAGNOSIS — J4541 Moderate persistent asthma with (acute) exacerbation: Secondary | ICD-10-CM | POA: Diagnosis not present

## 2021-09-21 MED ORDER — PREDNISONE 10 MG (21) PO TBPK: ORAL_TABLET | Freq: Every day | ORAL | 0 refills | Status: DC

## 2021-09-21 MED ORDER — PROMETHAZINE-DM 6.25-15 MG/5ML PO SYRP: 5.0000 mL | ORAL_SOLUTION | Freq: Four times a day (QID) | ORAL | 0 refills | Status: DC | PRN

## 2021-09-21 NOTE — Discharge Instructions (Addendum)
-  Prednisone taper for cough/bronchitis. I recommend taking this in the morning as it could give you energy.  Avoid NSAIDs like ibuprofen and alleve while taking this medication as they can increase your risk of stomach upset and even GI bleeding when in combination with a steroid. You can continue tylenol (acetaminophen) up to 1000mg  3x daily. -Promethazine DM cough syrup for congestion/cough. This could make you drowsy, so take at night before bed. -Continue albuterol and trelegy ellipta -Albuterol inhaler as needed for cough, wheezing, shortness of breath, 1 to 2 puffs every 6 hours as needed. -Follow-up if symptoms getting worse instead of better- shortness of breath, new fevers, new chest pain, etc.

## 2021-09-21 NOTE — ED Triage Notes (Signed)
Pt presents with c/o sore throat, cough and congestion X 1 week.   States she feels tired.

## 2021-09-21 NOTE — ED Provider Notes (Signed)
[place]    CSN: [id number] Arrival date & time: 09/21/21  1801      History   Chief Complaint Chief Complaint  Patient presents with   Cough   Nasal Congestion    HPI Aimee Davis is a 66 y.o. female presenting with viral syndrome x1 week. Medical history asthma, COPD - albuterol inhaler. Initially with sore throat, which has resolved. This progressed to nonproductive cough, generalized bodyaches, nasal congestion. Chest congestion persists. Cough is productive of yellow sputum. SOB with exertion, using albuterol inhaler twice daily. Fatigue, low energy. Hasn't monitored temperature at home. Has been taking Mucinex with minimal improvement. Exposure to sister who was sick, unsure of diagnosis.   HPI  Past Medical History:  Diagnosis Date   Abdominal pain 08/16/2008   Qualifier: Diagnosis of  By: Hassell Done FNP, [name]     Abdominal pain, epigastric 09/08/2019   Allergy    seasonal, PCN, antidepressants, bentyl   Arthritis    Asthma    Carpal tunnel syndrome on both sides    right worse than left 2008   Cataract 06/11/2013   COPD (chronic obstructive pulmonary disease) ([place])    COVID-19 virus infection 06/18/2016   Eczema 05/02/2018   GERD (gastroesophageal reflux disease)    Hyperlipidemia    LDL 108 JULY 2013   IBS (irritable bowel syndrome)    Macular rash 05/20/2019   Multiple nevi 03/01/2015   Pneumonia    Positive TB test    From MArch 2009 note : Recent F/u at [facility]. CXRAy negative.  Positive TST in 2007 (47mm).  Unable to tolerate INH due to hepatotoxicity    Patient Active Problem List   Diagnosis Date Noted   Abdominal pain 08/24/2021   Asymptomatic bacteriuria 08/24/2021   Adjustment disorder with anxiety 08/23/2021   Food poisoning 04/04/2021   Aortic atherosclerosis ([place]) 02/23/2021   Pure hypercholesterolemia 02/23/2021   Coronary artery disease involving native coronary artery of native heart without angina pectoris 02/23/2021   Back  muscle spasm 12/21/2020   Chest pressure 11/07/2020   Memory loss of unknown cause 12/08/2019   MDD (major depressive disorder) 12/05/2019   Restless leg syndrome 11/16/2019   Belching 09/08/2019   Hair loss 07/07/2019   Tinnitus 07/07/2019   Skin macule 04/21/2019   Seborrheic keratosis 04/21/2019   History of colon polyps 06/24/2018   Constitutional neutropenia ([place]) 02/21/2018   Seasonal allergies 03/29/2017   Chronic fatigue 07/16/2013   Healthcare maintenance 06/11/2013   Primary insomnia 06/05/2012   Asthma 01/31/2007   Gastroesophageal reflux disease 11/14/2006   Irritable bowel syndrome 11/14/2006    Past Surgical History:  Procedure Laterality Date   BREAST CYST EXCISION Right 2011   BREAST EXCISIONAL BIOPSY     benign   BREAST EXCISIONAL BIOPSY Right 1990   benign   INDUCED ABORTION  2005   VAGINAL HYSTERECTOMY  1983   partial; for abnormal bleeding    OB History   No obstetric history on file.      Home Medications    Prior to Admission medications   Medication Sig Start Date End Date Taking? Authorizing Provider  predniSONE (STERAPRED UNI-PAK 21 TAB) 10 MG (21) TBPK tablet Take by mouth daily. Take 6 tabs by mouth daily  for 2 days, then 5 tabs for 2 days, then 4 tabs for 2 days, then 3 tabs for 2 days, 2 tabs for 2 days, then 1 tab by mouth daily for 2 days 09/21/21  Yes Phillip Heal,  Sherlon Handing, PA-C  promethazine-dextromethorphan (PROMETHAZINE-DM) 6.25-15 MG/5ML syrup Take 5 mLs by mouth 4 (four) times daily as needed for cough. 09/21/21  Yes Hazel Sams, PA-C  albuterol (VENTOLIN HFA) 108 (90 Base) MCG/ACT inhaler Inhale 2 puffs into the lungs every 6 (six) hours as needed for wheezing or shortness of breath. 08/17/21   Lacinda Axon, MD  Artificial Tear Ointment (DRY EYES OP) Apply 1 drop to eye daily as needed (dry eyes).    [provider]  Ascorbic Acid (VITAMIN C) 100 MG tablet Take 50 mg by mouth daily.    [provider]  aspirin EC  81 MG tablet Take 1 tablet (81 mg total) by mouth daily. Swallow whole. 02/24/21   Weaver, Scott T, PA-C  B Complex-C (B-COMPLEX WITH VITAMIN C) tablet Take 1 tablet by mouth daily.    [provider]  cholecalciferol (VITAMIN D3) 25 MCG (1000 UNIT) tablet Take 1,000 Units by mouth daily.    [provider]  ezetimibe (ZETIA) 10 MG tablet Take 1 tablet (10 mg total) by mouth daily. Patient not taking: Reported on 09/06/2021 08/29/21   Freada Bergeron, MD  fexofenadine (ALLEGRA) 180 MG tablet Take 180 mg by mouth daily.    [provider]  Fluticasone-Umeclidin-Vilant (TRELEGY ELLIPTA) 200-62.5-25 MCG/INH AEPB Inhale 1 puff into the lungs daily. 06/15/21   Maudie Mercury, MD  Magnesium 400 MG TABS Take 2 each by mouth daily.    [provider]  metoprolol tartrate (LOPRESSOR) 25 MG tablet Patient to take 1 tablet by mouth 2 hours prior to procedure 11/24/20   Freada Bergeron, MD  mometasone (NASONEX) 50 MCG/ACT nasal spray PLACE 2 SPRAYS IN EACH NOSTRIL ONCE DAILY AS NEEDED. 06/15/21   Maudie Mercury, MD  Multiple Vitamins-Minerals (MULTIVITAMIN WITH MINERALS) tablet Take 1 tablet by mouth daily.    [provider]  omega-3 acid ethyl esters (LOVAZA) 1 g capsule Take by mouth 2 (two) times daily.    [provider]  OVER THE COUNTER MEDICATION EO MEGA Essential Oil - 2 daily    [provider]  pantoprazole (PROTONIX) 40 MG tablet Take 1 tablet (40 mg total) by mouth daily. 30-60 minutes before breakfast 08/22/21   Armbruster, Carlota Raspberry, MD  Peppermint Oil (IBGARD) 90 MG CPCR Take 1 capsule by mouth 2 (two) times daily as needed. Take as directed as needed 12/07/19   Armbruster, Carlota Raspberry, MD  simethicone (GAS-X) 80 MG chewable tablet Use as directed as needed 12/07/19   Armbruster, Carlota Raspberry, MD    Family History Family History  Problem Relation Age of Onset   Hypertension Mother    Heart disease Mother    Diabetes Mother     Arthritis Mother    Stroke Father    Diabetes Father    Heart disease Father    Hyperlipidemia Father    Hypertension Father    Kidney disease Father    Hypertension Sister    Breast cancer Sister    Breast cancer Sister    Hypertension Sister    Arthritis Sister    Hypertension Sister    Diabetes Sister    Mental illness Brother    Aneurysm Brother        brain   Arthritis Brother    Hypertension Maternal Aunt    Hypertension Maternal Uncle    Stomach cancer Maternal Uncle    Hypertension Paternal Aunt    Kidney disease Paternal Aunt  x 2   Hypertension Paternal Uncle    Kidney disease Paternal Uncle        x 2   Colon cancer Neg Hx    Esophageal cancer Neg Hx    Pancreatic cancer Neg Hx     Social History Social History   Tobacco Use   Smoking status: Former    Packs/day: 0.33    Years: 43.00    Pack years: 14.19    Types: Cigarettes    Quit date: 01/06/2013    Years since quitting: 8.7   Smokeless tobacco: Never  Vaping Use   Vaping Use: Never used  Substance Use Topics   Alcohol use: No    Alcohol/week: 0.0 standard drinks   Drug use: Not Currently    Comment: x 26 yrs.     Allergies   Hydrocodone, Lipitor [atorvastatin], Penicillins, and Levsin [hyoscyamine]   Review of Systems Review of Systems  Constitutional:  Negative for appetite change, chills and fever.  HENT:  Positive for congestion. Negative for ear pain, rhinorrhea, sinus pressure, sinus pain and sore throat.   Eyes:  Negative for redness and visual disturbance.  Respiratory:  Positive for cough and shortness of breath. Negative for chest tightness and wheezing.   Cardiovascular:  Negative for chest pain and palpitations.  Gastrointestinal:  Negative for abdominal pain, constipation, diarrhea, nausea and vomiting.  Genitourinary:  Negative for dysuria, frequency and urgency.  Musculoskeletal:  Negative for myalgias.  Neurological:  Negative for dizziness, weakness and headaches.   Psychiatric/Behavioral:  Negative for confusion.   All other systems reviewed and are negative.   Physical Exam Triage Vital Signs ED Triage Vitals [09/21/21 1904]  Enc Vitals Group     BP (!) 141/86     Pulse Rate 79     Resp 15     Temp 98 F (36.7 C)     Temp Source Oral     SpO2 99 %     Weight      Height      Head Circumference      Peak Flow      Pain Score 6     Pain Loc      Pain Edu?      Excl. in [place]?    No data found.  Updated Vital Signs BP (!) 141/86 (BP Location: Left Arm)    Pulse 79    Temp 98 F (36.7 C) (Oral)    Resp 15    SpO2 99%   Visual Acuity Right Eye Distance:   Left Eye Distance:   Bilateral Distance:    Right Eye Near:   Left Eye Near:    Bilateral Near:     Physical Exam Vitals reviewed.  Constitutional:      General: She is not in acute distress.    Appearance: Normal appearance. She is not ill-appearing.  HENT:     Head: Normocephalic and atraumatic.     Right Ear: Tympanic membrane, ear canal and external ear normal. No tenderness. No middle ear effusion. There is no impacted cerumen. Tympanic membrane is not perforated, erythematous, retracted or bulging.     Left Ear: Tympanic membrane, ear canal and external ear normal. No tenderness.  No middle ear effusion. There is no impacted cerumen. Tympanic membrane is not perforated, erythematous, retracted or bulging.     Nose: Nose normal. No congestion.     Mouth/Throat:     Mouth: Mucous membranes are moist.     Pharynx:  Uvula midline. No oropharyngeal exudate or posterior oropharyngeal erythema.  Eyes:     Extraocular Movements: Extraocular movements intact.     Pupils: Pupils are equal, round, and reactive to light.  Cardiovascular:     Rate and Rhythm: Normal rate and regular rhythm.     Heart sounds: Normal heart sounds.  Pulmonary:     Effort: Pulmonary effort is normal.     Breath sounds: Decreased breath sounds present. No wheezing, rhonchi or rales.     Comments:  Decreased breath sounds throughout  Abdominal:     Palpations: Abdomen is soft.     Tenderness: There is no abdominal tenderness. There is no guarding or rebound.  Lymphadenopathy:     Cervical: No cervical adenopathy.     Right cervical: No superficial cervical adenopathy.    Left cervical: No superficial cervical adenopathy.  Neurological:     General: No focal deficit present.     Mental Status: She is alert and oriented to person, place, and time.  Psychiatric:        Mood and Affect: Mood normal.        Behavior: Behavior normal.        Thought Content: Thought content normal.        Judgment: Judgment normal.     UC Treatments / Results  Labs (all labs ordered are listed, but only abnormal results are displayed) Labs Reviewed - No data to display  EKG   Radiology No results found.  Procedures Procedures (including critical care time)  Medications Ordered in UC Medications - No data to display  Initial Impression / Assessment and Plan / UC Course  I have reviewed the triage vital signs and the nursing notes.  Pertinent labs & imaging results that were available during my care of the patient were reviewed by me and considered in my medical decision making (see chart for details).     This patient is a very pleasant 66 y.o. year old female presenting with asthma exacerbation following viral URI. Today this pt is afebrile nontachycardic nontachypneic, oxygenating well on room air, no wheezes rhonchi or rales.   No covid or influenza test at onset of symptoms. Will defer today given duration symptoms > 7 days.   Prednisone taper and promethazine DM sent.   Low concern for PNA based on exam. Patient declines CXR and abx at today's visit; strict return precautions discussed.   ED return precautions discussed. Patient verbalizes understanding and agreement.   Level 4 for acute exacerbation of chronic condition and prescription drug management.   Final Clinical  Impressions(s) / UC Diagnoses   Final diagnoses:  Moderate persistent asthma with acute exacerbation  Viral URI with cough     Discharge Instructions      -Prednisone taper for cough/bronchitis. I recommend taking this in the morning as it could give you energy.  Avoid NSAIDs like ibuprofen and alleve while taking this medication as they can increase your risk of stomach upset and even GI bleeding when in combination with a steroid. You can continue tylenol (acetaminophen) up to 1000mg  3x daily. -Promethazine DM cough syrup for congestion/cough. This could make you drowsy, so take at night before bed. -Continue albuterol and trelegy ellipta -Albuterol inhaler as needed for cough, wheezing, shortness of breath, 1 to 2 puffs every 6 hours as needed. -Follow-up if symptoms getting worse instead of better- shortness of breath, new fevers, new chest pain, etc.      ED Prescriptions  Medication Sig Dispense Auth. Provider   predniSONE (STERAPRED UNI-PAK 21 TAB) 10 MG (21) TBPK tablet Take by mouth daily. Take 6 tabs by mouth daily  for 2 days, then 5 tabs for 2 days, then 4 tabs for 2 days, then 3 tabs for 2 days, 2 tabs for 2 days, then 1 tab by mouth daily for 2 days 42 tablet Hazel Sams, PA-C   promethazine-dextromethorphan (PROMETHAZINE-DM) 6.25-15 MG/5ML syrup Take 5 mLs by mouth 4 (four) times daily as needed for cough. 118 mL Hazel Sams, PA-C      PDMP not reviewed this encounter.   Hazel Sams, PA-C 09/21/21 1924

## 2021-09-25 ENCOUNTER — Ambulatory Visit (INDEPENDENT_AMBULATORY_CARE_PROVIDER_SITE_OTHER): Payer: Commercial Managed Care - HMO | Admitting: Student

## 2021-09-25 ENCOUNTER — Other Ambulatory Visit: Payer: Self-pay

## 2021-09-25 DIAGNOSIS — J069 Acute upper respiratory infection, unspecified: Secondary | ICD-10-CM

## 2021-09-25 MED ORDER — TRELEGY ELLIPTA 200-62.5-25 MCG/ACT IN AEPB: 1.0000 | INHALATION_SPRAY | Freq: Every day | RESPIRATORY_TRACT | 3 refills | Status: DC

## 2021-09-25 MED ORDER — ALBUTEROL SULFATE HFA 108 (90 BASE) MCG/ACT IN AERS: 2.0000 | INHALATION_SPRAY | Freq: Four times a day (QID) | RESPIRATORY_TRACT | 3 refills | Status: DC | PRN

## 2021-09-26 NOTE — Assessment & Plan Note (Addendum)
Patient presents for telehealth visit for cough and weakness for the past 2 weeks. States that she has been having a sore throat, with cough, green sputum, cough and congestion for about 2 weeks.  She went to the urgent care on 09/21/2021 diagnosed with URI and asthma exacerbation.  She was prescribed a prednisone taper and promethazine for her cough.  She states she took a COVID test on her symptoms initially started which was negative.  At urgent care she was noted to be nonfebrile with stable vital signs.  Chest x-ray and testing for COVID/influenza was deferred at that time.  She started taking the prednisone taper 3 days ago and noted to feel very weak with difficulty getting out of bed after she started this.  Denies any focal weakness but just felt generally very tired.  Today she is feeling a little bit better and is able to get out of bed.  Has been trying to maintain her p.o. intake and denies any nausea, vomiting.  She stopped taking the prednisone because she is concerned that this was the cause of her weakness.    Also notes that she is feeling more short of breath since she started the prednisone.  Has been using her Trelegy regularly, and using her albuterol inhaler once or twice a day since this began.  She does feel quite short of breath even with speaking.  Feels the albuterol helps with her shortness of breath.  She does not have a pulse ox at home or thermometer so she is not sure if she has been febrile or hypoxic.  Discussed that she can try increasing her albuterol to see if this helps with her shortness of breath.  If she becomes more short of breath or weeks to the point that she can no longer get out of bed would advise her to be seen in the ED.  Discussed that evaluation of the phone is limited and advised she follow-up in clinic later this week to further evaluate her shortness of breath if she continues to feel this when her symptoms are not improving.  Plan Continue  Trelegy Albuterol every 6 hours as needed for shortness of breath Continue with over-the-counter Mucinex and Robitussin as needed for cough and congestion May have secondary bacterial infection, but difficult to evaluate over the phone we will have her follow-up in person in clinic Would test patient influenza at clinic visit Precautions given for worsening symptoms to be evaluated in the emergency room

## 2021-09-26 NOTE — Progress Notes (Signed)
Internal Medicine Clinic Attending  Case discussed with Dr. Lisabeth Devoid  At the time of the visit.  We reviewed the residents history and exam and pertinent patient test results.  I agree with the assessment, diagnosis, and plan of care documented in the residents note.   Patient has URI symptoms and we're concerned about progression to bacterial infection. She needs an in-person assessment and is coming to clinic tomorrow.

## 2021-09-26 NOTE — Progress Notes (Signed)
° °  CC: Fatigue, shortness of breath  This is a telephone encounter between [employer] and Aimee Davis on 09/26/2021 for shortness of breath, cough and fatigue for the past 2 weeks. The visit was conducted with the patient located at home and Aimee Davis at [facility]. The patient's identity was confirmed using their DOB and current address. The patient has consented to being evaluated through a telephone encounter and understands the associated risks (an examination cannot be done and the patient may need to come in for an appointment) / benefits (allows the patient to remain at home, decreasing exposure to coronavirus). I personally spent 15 minutes on medical discussion.   HPI:  [name] is a 66 y.o. with PMH as below.   Please see A&P for assessment of the patient's acute and chronic medical conditions.   Past Medical History:  Diagnosis Date   Abdominal pain 08/16/2008   Qualifier: Diagnosis of  By: Hassell Done FNP, [name]     Abdominal pain, epigastric 09/08/2019   Allergy    seasonal, PCN, antidepressants, bentyl   Arthritis    Asthma    Carpal tunnel syndrome on both sides    right worse than left 2008   Cataract 06/11/2013   COPD (chronic obstructive pulmonary disease) ([place])    COVID-19 virus infection 06/18/2016   Eczema 05/02/2018   GERD (gastroesophageal reflux disease)    Hyperlipidemia    LDL 108 JULY 2013   IBS (irritable bowel syndrome)    Macular rash 05/20/2019   Multiple nevi 03/01/2015   Pneumonia    Positive TB test    From MArch 2009 note : Recent F/u at [facility]. CXRAy negative.  Positive TST in 2007 (35mm).  Unable to tolerate INH due to hepatotoxicity   Review of Systems:   Review of Systems  Constitutional:  Positive for chills and malaise/fatigue. Negative for fever.  HENT:  Positive for congestion and sore throat. Negative for sinus pain.   Eyes:  Negative for blurred vision and pain.  Respiratory:  Positive for cough, sputum production, shortness of  breath and wheezing. Negative for hemoptysis.   Cardiovascular:  Negative for chest pain.  Genitourinary:  Negative for dysuria and frequency.  Musculoskeletal:  Negative for joint pain and myalgias.  Skin:  Negative for rash.  Neurological:  Negative for dizziness, sensory change, focal weakness and headaches.    Assessment & Plan:   See Encounters Tab for problem based charting.  Patient discussed with Dr.  Cain Sieve

## 2021-09-27 ENCOUNTER — Encounter: Payer: Self-pay | Admitting: Student

## 2021-09-27 ENCOUNTER — Ambulatory Visit (INDEPENDENT_AMBULATORY_CARE_PROVIDER_SITE_OTHER): Payer: Medicare Other | Admitting: Student

## 2021-09-27 VITALS — BP 114/59 | HR 87 | Temp 97.6°F | Ht 63.0 in | Wt 139.1 lb

## 2021-09-27 DIAGNOSIS — J069 Acute upper respiratory infection, unspecified: Secondary | ICD-10-CM | POA: Diagnosis not present

## 2021-09-27 DIAGNOSIS — J4540 Moderate persistent asthma, uncomplicated: Secondary | ICD-10-CM | POA: Diagnosis not present

## 2021-09-27 DIAGNOSIS — R0609 Other forms of dyspnea: Secondary | ICD-10-CM

## 2021-09-27 NOTE — Patient Instructions (Addendum)
It was a pleasure seeing you in clinic. Today we discussed:   Shortness of breath: Please have CT of the lungs and follow up after this test. In the meantime please try using guaifenesin DM to help with the cough, common brand names are robitussin and mucinex   If you have any questions or concerns, please call our clinic at [phone] between 9am-5pm and after hours call [phone] and ask for the internal medicine resident on call. If you feel you are having a medical emergency please call 911.   Thank you, we look forward to helping you remain healthy!

## 2021-09-30 ENCOUNTER — Other Ambulatory Visit: Payer: Self-pay

## 2021-09-30 ENCOUNTER — Emergency Department (HOSPITAL_COMMUNITY): Payer: Medicare Other

## 2021-09-30 ENCOUNTER — Emergency Department (HOSPITAL_COMMUNITY)
Admission: EM | Admit: 2021-09-30 | Discharge: 2021-09-30 | Disposition: A | Discharge status: Home / Self Care | Payer: Medicare Other | Attending: Emergency Medicine | Admitting: Emergency Medicine

## 2021-09-30 ENCOUNTER — Encounter (HOSPITAL_COMMUNITY): Payer: Self-pay | Admitting: Emergency Medicine

## 2021-09-30 ENCOUNTER — Telehealth: Payer: Self-pay | Admitting: Student

## 2021-09-30 DIAGNOSIS — N3001 Acute cystitis with hematuria: Secondary | ICD-10-CM | POA: Insufficient documentation

## 2021-09-30 DIAGNOSIS — R319 Hematuria, unspecified: Secondary | ICD-10-CM | POA: Diagnosis not present

## 2021-09-30 DIAGNOSIS — R300 Dysuria: Secondary | ICD-10-CM | POA: Diagnosis not present

## 2021-09-30 DIAGNOSIS — J45909 Unspecified asthma, uncomplicated: Secondary | ICD-10-CM | POA: Diagnosis not present

## 2021-09-30 DIAGNOSIS — Z7982 Long term (current) use of aspirin: Secondary | ICD-10-CM | POA: Diagnosis not present

## 2021-09-30 DIAGNOSIS — I700 Atherosclerosis of aorta: Secondary | ICD-10-CM | POA: Diagnosis not present

## 2021-09-30 DIAGNOSIS — Z79899 Other long term (current) drug therapy: Secondary | ICD-10-CM | POA: Insufficient documentation

## 2021-09-30 DIAGNOSIS — J449 Chronic obstructive pulmonary disease, unspecified: Secondary | ICD-10-CM | POA: Insufficient documentation

## 2021-09-30 DIAGNOSIS — R109 Unspecified abdominal pain: Secondary | ICD-10-CM | POA: Diagnosis not present

## 2021-09-30 LAB — URINALYSIS, ROUTINE W REFLEX MICROSCOPIC
Bilirubin Urine: NEGATIVE
Glucose, UA: NEGATIVE mg/dL
Ketones, ur: NEGATIVE mg/dL
Nitrite: NEGATIVE
Protein, ur: NEGATIVE mg/dL
Specific Gravity, Urine: <1.005 — ABNORMAL LOW (ref 1.005–1.030)
pH: 7 (ref 5.0–8.0)

## 2021-09-30 LAB — COMPREHENSIVE METABOLIC PANEL
ALT: 17 U/L (ref 0–44)
AST: 19 U/L (ref 15–41)
Albumin: 3.7 g/dL (ref 3.5–5.0)
Alkaline Phosphatase: 66 U/L (ref 38–126)
Anion gap: 9 (ref 5–15)
BUN: 8 mg/dL (ref 8–23)
CO2: 24 mmol/L (ref 22–32)
Calcium: 9.8 mg/dL (ref 8.9–10.3)
Chloride: 99 mmol/L (ref 98–111)
Creatinine, Ser: 0.95 mg/dL (ref 0.44–1.00)
GFR, Estimated: >60 mL/min (ref >60)
Glucose, Bld: 107 mg/dL — ABNORMAL HIGH (ref 70–99)
Potassium: 4.3 mmol/L (ref 3.5–5.1)
Sodium: 132 mmol/L — ABNORMAL LOW (ref 135–145)
Total Bilirubin: 0.8 mg/dL (ref 0.3–1.2)
Total Protein: 6 g/dL — ABNORMAL LOW (ref 6.5–8.1)

## 2021-09-30 LAB — URINALYSIS, MICROSCOPIC (REFLEX)
RBC / HPF: >50 RBC/hpf (ref 0–5)
WBC, UA: >50 WBC/hpf (ref 0–5)

## 2021-09-30 LAB — CBC
HCT: 40.8 % (ref 36.0–46.0)
Hemoglobin: 13.4 g/dL (ref 12.0–15.0)
MCH: 28 pg (ref 26.0–34.0)
MCHC: 32.8 g/dL (ref 30.0–36.0)
MCV: 85.2 fL (ref 80.0–100.0)
Platelets: 316 10*3/uL (ref 150–400)
RBC: 4.79 MIL/uL (ref 3.87–5.11)
RDW: 12.6 % (ref 11.5–15.5)
WBC: 5.6 10*3/uL (ref 4.0–10.5)
nRBC: 0 % (ref 0.0–0.2)

## 2021-09-30 LAB — LIPASE, BLOOD: Lipase: 25 U/L (ref 11–51)

## 2021-09-30 MED ORDER — SODIUM CHLORIDE 0.9 % IV SOLN
1.0000 g | Freq: Once | INTRAVENOUS | Status: AC
  Administered 2021-09-30: 1 g via INTRAVENOUS
  Filled 2021-09-30: qty 10

## 2021-09-30 MED ORDER — CEFUROXIME AXETIL 250 MG PO TABS: 250.0000 mg | ORAL_TABLET | Freq: Two times a day (BID) | ORAL | 0 refills | Status: AC

## 2021-09-30 NOTE — Telephone Encounter (Signed)
Received page to return to call to patient. Patient reports she recently had upper respiratory infection, most likely viral. She was seen in our clinic three days ago for this issue. States that overnight last night she noticed blood with urination. Notes that she experienced burning as well as increased frequency. She notes overall she feels very weak and has had chills. Given her systemic symptoms, suggested Aimee Davis be seen today for further evaluation and lab work. Patient agreed to plan and is heading to [facility] ED.  Sanjuan Dame, MD Internal Medicine PGY-2 [phone]

## 2021-09-30 NOTE — ED Triage Notes (Signed)
C/o lower abd pain and hematuria since 4am.  Burning with urination.

## 2021-09-30 NOTE — ED Notes (Signed)
Patient verbalizes understanding of discharge instructions. Opportunity for questioning and answers were provided. Armband removed by staff, pt discharged from ED. Ambulated out to lobby  

## 2021-09-30 NOTE — Discharge Instructions (Signed)
Take the antibiotics as prescribed.  Follow-up with your primary doctor to be rechecked to make sure the infection clears

## 2021-09-30 NOTE — Progress Notes (Signed)
° °  CC: Dyspnea   HPI:  Aimee Davis is a 66 y.o. F who presents for dyspnea for the past 2 weeks. Please refer to problem based charting for further details and assessment and plan of current problem and chronic medical conditions.   Past Medical History:  Diagnosis Date   Abdominal pain 08/16/2008   Qualifier: Diagnosis of  By: Hassell Done FNP, [name]     Abdominal pain, epigastric 09/08/2019   Allergy    seasonal, PCN, antidepressants, bentyl   Arthritis    Asthma    Carpal tunnel syndrome on both sides    right worse than left 2008   Cataract 06/11/2013   COPD (chronic obstructive pulmonary disease) ([place])    COVID-19 virus infection 06/18/2016   Eczema 05/02/2018   GERD (gastroesophageal reflux disease)    Hyperlipidemia    LDL 108 JULY 2013   IBS (irritable bowel syndrome)    Macular rash 05/20/2019   Multiple nevi 03/01/2015   Pneumonia    Positive TB test    From MArch 2009 note : Recent F/u at [facility]. CXRAy negative.  Positive TST in 2007 (64mm).  Unable to tolerate INH due to hepatotoxicity   Review of Systems:  negative as per HPI  Physical Exam:  Vitals:   09/27/21 1334  BP: (!) 114/59  Pulse: 87  Temp: 97.6 F (36.4 C)  TempSrc: Oral  SpO2: 98%  Weight: 139 lb 1.6 oz (63.1 kg)  Height: 5\' 3"  (1.6 m)   Physical Exam Constitutional:      General: She is not in acute distress.    Appearance: She is well-developed.  HENT:     Head: Normocephalic and atraumatic.     Mouth/Throat:     Mouth: Mucous membranes are moist.     Comments: Erythema of the posterior pharynx with cobblestoning Cardiovascular:     Rate and Rhythm: Normal rate and regular rhythm.  Pulmonary:     Effort: Pulmonary effort is normal.     Comments: Breath sounds ausculted bilateral lung bases, wheezing in anterior lung fields with forced expiration, no crackles or significant wheezing with normal respiration  Abdominal:     General: Bowel sounds are normal.     Palpations: Abdomen is  soft.     Tenderness: There is no abdominal tenderness.  Musculoskeletal:        General: Normal range of motion.  Skin:    General: Skin is warm and dry.  Neurological:     General: No focal deficit present.     Mental Status: She is alert.  Psychiatric:        Mood and Affect: Mood normal.        Behavior: Behavior normal.     Assessment & Plan:   See Encounters Tab for problem based charting.  Patient seen with Dr. Jimmye Norman

## 2021-09-30 NOTE — ED Notes (Signed)
Patient transported to CT 

## 2021-09-30 NOTE — ED Provider Notes (Signed)
[facility] DEPARTMENT Provider Note   CSN: [id number] Arrival date & time: 09/30/21  1439     History  Chief Complaint  Patient presents with   Abdominal Pain   Hematuria    Aimee Davis is a 66 y.o. female.   Abdominal Pain Associated symptoms: hematuria   Hematuria Associated symptoms include abdominal pain.   Patient has a history of asthma, COPD, irritable bowel syndrome, gastroesophageal reflux disease who presents with complaints of hematuria and dysuria.  Patient states she started having the symptoms yesterday.  She noticed blood in her urine.  It happened several times.  It started to ease off but then returned this morning.  She has been having some pain in her lower abdomen as well as intense pain in her back.  She felt chilled yesterday but did not measure any fever.  No vomiting or diarrhea.  Home Medications Prior to Admission medications   Medication Sig Start Date End Date Taking? Authorizing Provider  cefUROXime (CEFTIN) 250 MG tablet Take 1 tablet (250 mg total) by mouth 2 (two) times daily with a meal for 7 days. 09/30/21 10/07/21 Yes Dorie Rank, MD  albuterol (VENTOLIN HFA) 108 (90 Base) MCG/ACT inhaler Inhale 2 puffs into the lungs every 6 (six) hours as needed for wheezing or shortness of breath. 09/25/21   Lacinda Axon, MD  Artificial Tear Ointment (DRY EYES OP) Apply 1 drop to eye daily as needed (dry eyes).    [provider]  Ascorbic Acid (VITAMIN C) 100 MG tablet Take 50 mg by mouth daily.    [provider]  aspirin EC 81 MG tablet Take 1 tablet (81 mg total) by mouth daily. Swallow whole. 02/24/21   Weaver, Scott T, PA-C  B Complex-C (B-COMPLEX WITH VITAMIN C) tablet Take 1 tablet by mouth daily.    [provider]  cholecalciferol (VITAMIN D3) 25 MCG (1000 UNIT) tablet Take 1,000 Units by mouth daily.    [provider]  ezetimibe (ZETIA) 10 MG tablet Take 1 tablet (10 mg total) by  mouth daily. Patient not taking: Reported on 09/06/2021 08/29/21   Freada Bergeron, MD  fexofenadine (ALLEGRA) 180 MG tablet Take 180 mg by mouth daily.    [provider]  Fluticasone-Umeclidin-Vilant (TRELEGY ELLIPTA) 200-62.5-25 MCG/ACT AEPB Inhale 1 puff into the lungs daily. 09/25/21   [name], MD  Magnesium 400 MG TABS Take 2 each by mouth daily.    [provider]  metoprolol tartrate (LOPRESSOR) 25 MG tablet Patient to take 1 tablet by mouth 2 hours prior to procedure 11/24/20   Freada Bergeron, MD  mometasone (NASONEX) 50 MCG/ACT nasal spray PLACE 2 SPRAYS IN EACH NOSTRIL ONCE DAILY AS NEEDED. 06/15/21   Maudie Mercury, MD  Multiple Vitamins-Minerals (MULTIVITAMIN WITH MINERALS) tablet Take 1 tablet by mouth daily.    [provider]  omega-3 acid ethyl esters (LOVAZA) 1 g capsule Take by mouth 2 (two) times daily.    [provider]  OVER THE COUNTER MEDICATION EO MEGA Essential Oil - 2 daily    [provider]  pantoprazole (PROTONIX) 40 MG tablet Take 1 tablet (40 mg total) by mouth daily. 30-60 minutes before breakfast 08/22/21   Armbruster, Carlota Raspberry, MD  Peppermint Oil (IBGARD) 90 MG CPCR Take 1 capsule by mouth 2 (two) times daily as needed. Take as directed as needed 12/07/19   Armbruster, Carlota Raspberry, MD  predniSONE (STERAPRED UNI-PAK 21 TAB) 10 MG (  21) TBPK tablet Take by mouth daily. Take 6 tabs by mouth daily  for 2 days, then 5 tabs for 2 days, then 4 tabs for 2 days, then 3 tabs for 2 days, 2 tabs for 2 days, then 1 tab by mouth daily for 2 days 09/21/21   Hazel Sams, PA-C  promethazine-dextromethorphan (PROMETHAZINE-DM) 6.25-15 MG/5ML syrup Take 5 mLs by mouth 4 (four) times daily as needed for cough. 09/21/21   Hazel Sams, PA-C  simethicone (GAS-X) 80 MG chewable tablet Use as directed as needed 12/07/19   Armbruster, Carlota Raspberry, MD      Allergies    Hydrocodone, Lipitor [atorvastatin], Penicillins, and Levsin  [hyoscyamine]    Review of Systems   Review of Systems  Gastrointestinal:  Positive for abdominal pain.  Genitourinary:  Positive for hematuria.   Physical Exam Updated Vital Signs BP 121/74    Pulse 73    Temp 98.3 F (36.8 C) (Oral)    Resp 18    SpO2 96%  Physical Exam Vitals and nursing note reviewed.  Constitutional:      Appearance: She is well-developed. She is not ill-appearing or diaphoretic.  HENT:     Head: Normocephalic and atraumatic.     Right Ear: External ear normal.     Left Ear: External ear normal.  Eyes:     General: No scleral icterus.       Right eye: No discharge.        Left eye: No discharge.     Conjunctiva/sclera: Conjunctivae normal.  Neck:     Trachea: No tracheal deviation.  Cardiovascular:     Rate and Rhythm: Normal rate and regular rhythm.  Pulmonary:     Effort: Pulmonary effort is normal. No respiratory distress.     Breath sounds: Normal breath sounds. No stridor. No wheezing or rales.  Abdominal:     General: Bowel sounds are normal. There is no distension.     Palpations: Abdomen is soft.     Tenderness: There is abdominal tenderness. There is no right CVA tenderness, left CVA tenderness, guarding or rebound.     Hernia: No hernia is present.     Comments: Mild suprapubic tenderness and tenderness palpation bilateral lower spine  Musculoskeletal:        General: No tenderness or deformity.     Cervical back: Neck supple.  Skin:    General: Skin is warm and dry.     Findings: No rash.  Neurological:     General: No focal deficit present.     Mental Status: She is alert.     Cranial Nerves: No cranial nerve deficit (no facial droop, extraocular movements intact, no slurred speech).     Sensory: No sensory deficit.     Motor: No abnormal muscle tone or seizure activity.     Coordination: Coordination normal.  Psychiatric:        Mood and Affect: Mood normal.    ED Results / Procedures / Treatments   Labs (all labs ordered are  listed, but only abnormal results are displayed) Labs Reviewed  COMPREHENSIVE METABOLIC PANEL - Abnormal; Notable for the following components:      Result Value   Sodium 132 (*)    Glucose, Bld 107 (*)    Total Protein 6.0 (*)    All other components within normal limits  URINALYSIS, ROUTINE W REFLEX MICROSCOPIC - Abnormal; Notable for the following components:   APPearance HAZY (*)    Specific  Gravity, Urine <1.005 (*)    Hgb urine dipstick LARGE (*)    Leukocytes,Ua LARGE (*)    All other components within normal limits  URINALYSIS, MICROSCOPIC (REFLEX) - Abnormal; Notable for the following components:   Bacteria, UA MANY (*)    All other components within normal limits  URINE CULTURE  LIPASE, BLOOD  CBC    EKG None  Radiology CT Renal Stone Study  Result Date: 09/30/2021 CLINICAL DATA:  Flank pain and hematuria beginning this morning. Dysuria. EXAM: CT ABDOMEN AND PELVIS WITHOUT CONTRAST TECHNIQUE: Multidetector CT imaging of the abdomen and pelvis was performed following the standard protocol without IV contrast. RADIATION DOSE REDUCTION: This exam was performed according to the departmental dose-optimization program which includes automated exposure control, adjustment of the mA and/or kV according to patient size and/or use of iterative reconstruction technique. COMPARISON:  03/26/2021 FINDINGS: Lower chest: No acute findings. Hepatobiliary: No mass visualized on this unenhanced exam. Gallbladder is unremarkable. No evidence of biliary ductal dilatation. Pancreas: No mass or inflammatory process visualized on this unenhanced exam. Spleen:  Within normal limits in size. Adrenals/Urinary tract: No evidence of urolithiasis or hydronephrosis. Unremarkable unopacified urinary bladder. Stomach/Bowel: No evidence of obstruction, inflammatory process, or abnormal fluid collections. Vascular/Lymphatic: No pathologically enlarged lymph nodes identified. No evidence of abdominal aortic  aneurysm. Aortic atherosclerotic calcification noted. Reproductive: Prior hysterectomy noted. Adnexal regions are unremarkable in appearance. Other:  None. Musculoskeletal:  No suspicious bone lesions identified. IMPRESSION: No evidence of urolithiasis, hydronephrosis, or other acute findings. Aortic Atherosclerosis (ICD10-I70.0). Electronically Signed   By: Marlaine Hind M.D.   On: 09/30/2021 18:18    Procedures Procedures    Medications Ordered in ED Medications  cefTRIAXone (ROCEPHIN) 1 g in sodium chloride 0.9 % 100 mL IVPB (0 g Intravenous Stopped 09/30/21 1841)    ED Course/ Medical Decision Making/ A&P Clinical Course as of 09/30/21 2007  Sat Sep 30, 2021  1721 Comprehensive metabolic panel(!) Normal except for hyponatremia [JK]  1721 Lipase, blood Normal [JK]  1721 CBC Normal [JK]  1721 Urinalysis, Microscopic (reflex)(!) Hematuria pyuria and many bacteria noted .consistent with infection [JK]  1912 CT scan images and report reviewed.  No acute findings [JK]    Clinical Course User Index [JK] Dorie Rank, MD                           Medical Decision Making  Patient presented to the ER with complaints of hematuria and back pain.  Patient has signs of infection on her urinalysis.  With her back pain CT scan was performed.  Fortunately no signs of ureterolithiasis.  No signs of obstruction.  Patient without signs of systemic infection.  No sepsis.  Her symptoms improved.  She was treated with IV antibiotics.  Will discharge home with course of antibiotics for continued treatment.        Final Clinical Impression(s) / ED Diagnoses Final diagnoses:  Acute cystitis with hematuria    Rx / DC Orders ED Discharge Orders          Ordered    cefUROXime (CEFTIN) 250 MG tablet  2 times daily with meals        09/30/21 2007              Dorie Rank, MD 09/30/21 2008

## 2021-09-30 NOTE — Assessment & Plan Note (Addendum)
Aimee Davis prsents for follow-up shortness of breath that started 2 years ago.  Was seen in urgent care for this and treated for URI with asthma exacerbation.  Telehealth visit 2 days ago for continued shortness of breath and increased weakness after starting prednisone.  Has been feeling moderately better after discontinuing prednisone.  States she continues to have very significant dyspnea with any sort of exertion including putting on close, toileting, making food, or speaking.  Feels her energy has improved since this weekend but is still easily fatigued and drained.  Continues to endorse having a cough, sore throat, sputum production, nasal congestion during this.  She continues to use Trelegy daily.  Feels like she needs her albuterol and uses about once a day.  Has been using promethazine cough syrup at home from urgent care but does not feel this is improving her cough.  Cough states she is afebrile saturating well 90% on room air.  Does not appear in respiratory distress.  Exam without crackles wheezing with forced expiration but not with normal breathing.  She has a history of chronic dyspnea with a diagnosis of asthma.  Likely her recent URI exacerbated her symptoms however her dyspnea seems disproportionate to her physical exam today.  Prior PFTs with combined obstructive and restrictive pulmonary disease.  Has not had much relief in her dyspnea despite increasing inhalers.  Also has a history of COVID infection in 2020 with concern of possible ILD. Discussed past smoking history has about a 14-year pack year history and quit in 2014,  Plan CT lung Continue trelegy Micuinex for cough and congestion Follow up after CT lung completes May need referral to [facility] pending further imaging  CT imaging with emphysematous changes consistent with history of smoking, no clear explanation for restrictive physiology or signs of ILD.

## 2021-10-02 LAB — URINE CULTURE: Culture: >=100000 — AB

## 2021-10-03 ENCOUNTER — Telehealth: Payer: Self-pay

## 2021-10-03 ENCOUNTER — Ambulatory Visit: Payer: Medicare Other | Admitting: Behavioral Health

## 2021-10-03 DIAGNOSIS — F419 Anxiety disorder, unspecified: Secondary | ICD-10-CM

## 2021-10-03 DIAGNOSIS — F331 Major depressive disorder, recurrent, moderate: Secondary | ICD-10-CM

## 2021-10-03 NOTE — Telephone Encounter (Signed)
Post ED Visit - Positive Culture Follow-up  Culture report reviewed by antimicrobial stewardship pharmacist: [place] Team []  Elenor Quinones, Pharm.D. [x]  Heide Guile, Pharm.D., BCPS AQ-ID []  Parks Neptune, Pharm.D., BCPS []  Alycia Rossetti, Pharm.D., BCPS []  [place], [place].D., BCPS, AAHIVP []  Legrand Como, Pharm.D., BCPS, AAHIVP []  Salome Arnt, PharmD, BCPS []  Johnnette Gourd, PharmD, BCPS []  Hughes Better, PharmD, BCPS []  Leeroy Cha, PharmD []  Laqueta Linden, PharmD, BCPS []  Albertina Parr, PharmD  [place] Team []  Leodis Sias, PharmD []  Lindell Spar, PharmD []  Royetta Asal, PharmD []  Graylin Shiver, Rph []  Rema Fendt) Glennon Mac, PharmD []  Arlyn Dunning, PharmD []  Netta Cedars, PharmD []  Dia Sitter, PharmD []  Leone Haven, PharmD []  Gretta Arab, PharmD []  Theodis Shove, PharmD []  Peggyann Juba, PharmD []  Reuel Boom, PharmD   Positive urine culture Treated with Cefuroxime Axetil, organism sensitive to the same and no further patient follow-up is required at this time.  Glennon Hamilton 10/03/2021, 12:22 PM

## 2021-10-03 NOTE — BH Specialist Note (Signed)
Integrated Behavioral Health via Telemedicine Visit  10/03/2021 Aimee Davis [id number]  Number of [place] visits: 4/6 Session Start time: 2:00pm  Session End time: 2:45pm Total time: 70  Referring Provider: Almeta Monas., RN Patient/Family location: Pt is in her home in private [facility] Provider location: [facility] All persons participating in visit: Pt & Clinician Types of Service: Individual psychotherapy  I connected with Aimee Davis and/or Aimee Davis's  self  via  Telephone or Geologist, engineering  (Video is Tree surgeon) and verified that I am speaking with the correct person using two identifiers. Discussed confidentiality:  4th visit  I discussed the limitations of telemedicine and the availability of in person appointments.  Discussed there is a possibility of technology failure and discussed alternative modes of communication if that failure occurs.  I discussed that engaging in this telemedicine visit, they consent to the provision of behavioral healthcare and the services will be billed under their insurance.  Patient and/or legal guardian expressed understanding and consented to Telemedicine visit: Yes   Presenting Concerns: Patient and/or family reports the following symptoms/concerns: elevated anx/dep & intense feelings of loneliness; Pt has little to no contact w/others for social interaction unless she creates this herself. Her children are not currently responsive to her needs per Pt report.  Pt wants a Volunteer position or a PT job where she is in contact w/people. This will also provide a schedule & routine.  Pt moved 3 yrs ago to a new location that is not suitable. She feels isolated from her normal circle of contacts. She has limited budget for gas & car issues, so this causes feelings of being stranded.  Pt thinks there is something wrong w/her brain due to COVID-19. Duration of problem: months ; Severity  of problem: moderate  Patient and/or Family's Strengths/Protective Factors: Social and Emotional competence, Concrete supports in place (healthy food, safe environments, etc.), and Sense of purpose is needed through [employer] work or a PT job that provides income.  Goals Addressed: Patient will:  Reduce symptoms of: anxiety, depression, and sleep hygiene disturbance    Increase knowledge and/or ability of: coping skills, healthy habits, stress reduction, and sleep hygiene instruction    Demonstrate ability to: Increase healthy adjustment to current life circumstances and Increase adequate support systems for patient/family  Progress towards Goals: Ongoing  Interventions: Interventions utilized:  Solution-Focused Strategies, Sleep Hygiene, and use of [employer] [place] for support calls & other needs she cannot currently meet Standardized Assessments completed:  screeners  Patient and/or Family Response: Pt receptive to call today & requests future appt for contact & mental health wellness support  Assessment: Patient currently experiencing elevated anx/dep & loneliness, sleep hygiene issues & lack of contact w/family & friends.   Patient may benefit from Hydro calls from a supportive Senior assoc'd w/Sr [employer].  Plan: Follow up with behavioral health clinician on : 2-3 wks for 60 min telehealth Behavioral recommendations: Cont to attend Church services to connect w/others, give your Son a request to fix your car-you need his help, call friend Butch Penny & see what other positions are available where you feel a sense of purpose in giving to others Referral(s): [place] (In Clinic), [place] (LME/Outside Clinic), and Community Resources:  contact calls to prevent intense loneliness & isolation from others.  I discussed the assessment and treatment plan with the patient and/or parent/guardian. They were provided an  opportunity to ask  questions and all were answered. They agreed with the plan and demonstrated an understanding of the instructions.   They were advised to call back or seek an in-person evaluation if the symptoms worsen or if the condition fails to improve as anticipated.  Donnetta Hutching, LMFT

## 2021-10-09 ENCOUNTER — Ambulatory Visit (HOSPITAL_COMMUNITY): Payer: Medicare Other

## 2021-10-10 ENCOUNTER — Other Ambulatory Visit: Payer: Self-pay

## 2021-10-10 ENCOUNTER — Ambulatory Visit (HOSPITAL_COMMUNITY)
Admission: RE | Admit: 2021-10-10 | Discharge: 2021-10-10 | Disposition: A | Discharge status: Home / Self Care | Payer: Medicare Other | Source: Ambulatory Visit | Attending: Internal Medicine | Admitting: Internal Medicine

## 2021-10-10 ENCOUNTER — Other Ambulatory Visit: Payer: Medicare Other | Admitting: *Deleted

## 2021-10-10 DIAGNOSIS — R5382 Chronic fatigue, unspecified: Secondary | ICD-10-CM | POA: Diagnosis not present

## 2021-10-10 DIAGNOSIS — R059 Cough, unspecified: Secondary | ICD-10-CM | POA: Diagnosis not present

## 2021-10-10 DIAGNOSIS — Z79899 Other long term (current) drug therapy: Secondary | ICD-10-CM | POA: Diagnosis not present

## 2021-10-10 DIAGNOSIS — E7800 Pure hypercholesterolemia, unspecified: Secondary | ICD-10-CM

## 2021-10-10 DIAGNOSIS — I2510 Atherosclerotic heart disease of native coronary artery without angina pectoris: Secondary | ICD-10-CM | POA: Diagnosis not present

## 2021-10-10 DIAGNOSIS — E785 Hyperlipidemia, unspecified: Secondary | ICD-10-CM | POA: Diagnosis not present

## 2021-10-10 LAB — LIPID PANEL
Chol/HDL Ratio: 2.3 ratio (ref 0.0–4.4)
Cholesterol, Total: 216 mg/dL — ABNORMAL HIGH (ref 100–199)
HDL: 94 mg/dL (ref >39)
LDL Chol Calc (NIH): 108 mg/dL — ABNORMAL HIGH (ref 0–99)
Triglycerides: 83 mg/dL (ref 0–149)
VLDL Cholesterol Cal: 14 mg/dL (ref 5–40)

## 2021-10-16 NOTE — Progress Notes (Signed)
Internal Medicine Clinic Attending ? ?Case discussed with Dr. Liang  At the time of the visit.  We reviewed the resident?s history and exam and pertinent patient test results.  I agree with the assessment, diagnosis, and plan of care documented in the resident?s note. ? ?

## 2021-10-17 ENCOUNTER — Ambulatory Visit: Payer: Medicare Other | Admitting: Behavioral Health

## 2021-10-17 DIAGNOSIS — F331 Major depressive disorder, recurrent, moderate: Secondary | ICD-10-CM

## 2021-10-17 DIAGNOSIS — F419 Anxiety disorder, unspecified: Secondary | ICD-10-CM

## 2021-10-17 DIAGNOSIS — F4322 Adjustment disorder with anxiety: Secondary | ICD-10-CM

## 2021-10-17 NOTE — BH Specialist Note (Signed)
Integrated Behavioral Health via Telemedicine Visit  10/17/2021 Aimee Davis [id number]  Number of [place] visits: 5/6 Session Start time: 9:00am  Session End time: 9:45am Total time: 26   Referring Provider: Almeta Monas., RN Patient/Family location: Pt is staying w/her Mother temporarily [facility] Provider location: [facility] Office All persons participating in visit: Pt & Clinician Types of Service: Individual psychotherapy  I connected with Aimee Davis and/or Aimee Davis's  self  via  Telephone or Geologist, engineering  (Video is Tree surgeon) and verified that I am speaking with the correct person using two identifiers. Discussed confidentiality:  5th visit  I discussed the limitations of telemedicine and the availability of in person appointments.  Discussed there is a possibility of technology failure and discussed alternative modes of communication if that failure occurs.  I discussed that engaging in this telemedicine visit, they consent to the provision of behavioral healthcare and the services will be billed under their insurance.  Patient and/or legal guardian expressed understanding and consented to Telemedicine visit:  5th visit  Presenting Concerns: Patient and/or family reports the following symptoms/concerns: elevated anx/dep over the state of her transportation insecurity & the logistics of fixing it Duration of problem: weeks; Severity of problem: moderate-causing lack of sleep & elevated anxiety  Patient and/or Family's Strengths/Protective Factors: Concrete supports in place (healthy food, safe environments, etc.) and Physical Health (exercise, healthy diet, medication compliance, etc.)  Goals Addressed: Patient will:  Reduce symptoms of: anxiety, depression, insomnia, and stress   Increase knowledge and/or ability of: coping skills and stress reduction   Demonstrate ability to: Increase healthy adjustment to current  life circumstances and Increase adequate support systems for patient/family  Progress towards Goals: Ongoing  Interventions: Interventions utilized:  Supportive Counseling Standardized Assessments completed:  screeners prn  Patient and/or Family Response: Pt is receptive to call today & requests future visit  Assessment: Patient currently experiencing elevated anx/dep & mental fatigue dealing w/her transportation situation.  Pt is unable to adjust to probl-solving decisions around this transportation difficulty.  Patient may benefit from cont'd sessions during this difficult challenge.   Plan: Follow up with behavioral health clinician on : 2-3 wks for 30 min on telehealth Behavioral recommendations: None today Referral(s): [place] (In Clinic)  I discussed the assessment and treatment plan with the patient and/or parent/guardian. They were provided an opportunity to ask questions and all were answered. They agreed with the plan and demonstrated an understanding of the instructions.   They were advised to call back or seek an in-person evaluation if the symptoms worsen or if the condition fails to improve as anticipated.  Donnetta Hutching, LMFT

## 2021-10-25 ENCOUNTER — Ambulatory Visit: Payer: Medicare Other | Admitting: Behavioral Health

## 2021-10-25 DIAGNOSIS — F419 Anxiety disorder, unspecified: Secondary | ICD-10-CM

## 2021-10-25 DIAGNOSIS — F332 Major depressive disorder, recurrent severe without psychotic features: Secondary | ICD-10-CM

## 2021-10-25 NOTE — BH Specialist Note (Addendum)
Integrated Behavioral Health via Telemedicine Visit  10/25/2021 Aimee Davis [id number]  Number of [place] Clinician visits: 6 Session Start time: 1:40pm Session End time: 2:15pm Total time in minutes: 35 min  Referring Provider: Almeta Monas., RN Patient/Family location: Pt is home @ her Mother's home in private [facility] Provider location: [facility] Office All persons participating in visit: Pt & Clinician Types of Service: Individual psychotherapy  I connected with [name] and/or Alysabeth C Buening's  self  via  Telephone or Geologist, engineering  (Video is Tree surgeon) and verified that I am speaking with the correct person using two identifiers. Discussed confidentiality:  6th visit  I discussed the limitations of telemedicine and the availability of in person appointments.  Discussed there is a possibility of technology failure and discussed alternative modes of communication if that failure occurs.  I discussed that engaging in this telemedicine visit, they consent to the provision of behavioral healthcare and the services will be billed under their insurance.  Patient and/or legal guardian expressed understanding and consented to Telemedicine visit:  6th visit  Presenting Concerns: Patient and/or family reports the following symptoms/concerns: elevated anx/dep due to feeling stuck in her current issues w/her lack of transportation & her relations w/her Family Duration of problem: months; Severity of problem: moderate  Patient and/or Family's Strengths/Protective Factors: Concrete supports in place (healthy food, safe environments, etc.) and Sense of purpose  Goals Addressed: Patient will:  Reduce symptoms of: anxiety, depression, and stress   Increase knowledge and/or ability of: coping skills and self-management skills   Demonstrate ability to: Increase healthy adjustment to current life circumstances  Progress towards  Goals: Ongoing  Interventions: Interventions utilized:  Supportive Counseling Standardized Assessments completed:  screeners prn  Mailed Pt Brain Activities List & Buddha quote-Be good to yourself!  Patient and/or Family Response: Pt receptive to call today & requests future appts  Assessment: Patient currently experiencing elevated anx/dep & confusion over her communications w/Family & others. Pt is exp'g perseverative thought patterns over bothersome issues.  Pt has troublesome relationship w/one Str who is always c/o Pt's lack of helping to Mother. Str will complain to everyone about this. Pt sts both her Strs will treat her this way. Pt has 3 Strs who all c/o Pt's beh.   Pt is not achieving good solid sleep hygiene. She wakens in early morning ~2:30am & re-initiates sleep 30-60 min later.   Patient may benefit from cont'd psychotherapy sessions & possible need for Neuropsych Testing to include Cognitive Processing Evaluation.  Plan: Follow up with behavioral health clinician on : one month for 30 min telehealth Behavioral recommendations: Cont doing your brain activities to keep your cognition strong Referral(s): [place] (In Clinic)  I discussed the assessment and treatment plan with the patient and/or parent/guardian. They were provided an opportunity to ask questions and all were answered. They agreed with the plan and demonstrated an understanding of the instructions.   They were advised to call back or seek an in-person evaluation if the symptoms worsen or if the condition fails to improve as anticipated.  Donnetta Hutching, LMFT

## 2021-10-30 ENCOUNTER — Other Ambulatory Visit: Payer: Self-pay

## 2021-10-30 MED ORDER — LUBIPROSTONE 8 MCG PO CAPS: 8.0000 ug | ORAL_CAPSULE | Freq: Two times a day (BID) | ORAL | 1 refills | Status: DC

## 2021-10-30 NOTE — Progress Notes (Signed)
Refill Amitiza 8 mcg

## 2021-10-31 ENCOUNTER — Other Ambulatory Visit: Payer: Self-pay

## 2021-10-31 ENCOUNTER — Ambulatory Visit (HOSPITAL_COMMUNITY)
Admission: RE | Admit: 2021-10-31 | Discharge: 2021-10-31 | Disposition: A | Discharge status: Home / Self Care | Payer: Medicare Other | Source: Ambulatory Visit | Attending: Internal Medicine | Admitting: Internal Medicine

## 2021-10-31 DIAGNOSIS — J439 Emphysema, unspecified: Secondary | ICD-10-CM | POA: Diagnosis not present

## 2021-10-31 DIAGNOSIS — R0609 Other forms of dyspnea: Secondary | ICD-10-CM | POA: Diagnosis not present

## 2021-10-31 DIAGNOSIS — I700 Atherosclerosis of aorta: Secondary | ICD-10-CM | POA: Diagnosis not present

## 2021-10-31 DIAGNOSIS — R0600 Dyspnea, unspecified: Secondary | ICD-10-CM | POA: Diagnosis not present

## 2021-10-31 MED ORDER — LUBIPROSTONE 8 MCG PO CAPS: 8.0000 ug | ORAL_CAPSULE | Freq: Two times a day (BID) | ORAL | 1 refills | Status: DC

## 2021-10-31 NOTE — Progress Notes (Signed)
Patient requested 90 day script for Amitiza. Sent to [facility] in [place]

## 2021-11-20 ENCOUNTER — Other Ambulatory Visit: Payer: Self-pay

## 2021-11-20 ENCOUNTER — Institutional Professional Consult (permissible substitution): Payer: Medicare Other | Admitting: Behavioral Health

## 2021-11-20 ENCOUNTER — Telehealth: Payer: Self-pay | Admitting: Behavioral Health

## 2021-11-20 ENCOUNTER — Other Ambulatory Visit: Payer: Self-pay | Admitting: Cardiology

## 2021-11-20 DIAGNOSIS — R079 Chest pain, unspecified: Secondary | ICD-10-CM

## 2021-11-20 MED ORDER — PANTOPRAZOLE SODIUM 40 MG PO TBEC: 40.0000 mg | DELAYED_RELEASE_TABLET | Freq: Every day | ORAL | 5 refills | Status: DC

## 2021-11-20 NOTE — Telephone Encounter (Signed)
Unsuccessful attempts to contact Pt today via telehealth. Lft 3 msg for Pt to [facility] to [facility] & r/s @ her convenience.  ? ?Dr. Theodis Shove ?

## 2021-11-20 NOTE — Progress Notes (Signed)
Refill for pantoprazole qd sent to [facility] ?

## 2021-12-05 ENCOUNTER — Other Ambulatory Visit: Payer: Self-pay

## 2021-12-05 MED ORDER — TRELEGY ELLIPTA 200-62.5-25 MCG/ACT IN AEPB: 1.0000 | INHALATION_SPRAY | Freq: Every day | RESPIRATORY_TRACT | 3 refills | Status: DC

## 2021-12-13 ENCOUNTER — Telehealth: Payer: Self-pay

## 2021-12-13 NOTE — Telephone Encounter (Signed)
Pt is requesting a call back she is wanting to know the results of her MRI  ?

## 2021-12-14 ENCOUNTER — Ambulatory Visit: Payer: Medicare Other | Admitting: Behavioral Health

## 2021-12-14 DIAGNOSIS — F331 Major depressive disorder, recurrent, moderate: Secondary | ICD-10-CM

## 2021-12-14 DIAGNOSIS — F419 Anxiety disorder, unspecified: Secondary | ICD-10-CM

## 2021-12-14 NOTE — BH Specialist Note (Signed)
Integrated Behavioral Health via Telemedicine Visit ? ?12/14/2021 ?Aimee Davis ?[id number] ? ?Number of [place] Clinician visits: 7 ?Session Start time: 1400 ?Session End time: 1430 ?Total time in minutes: 30 min ? ?Referring Provider: Almeta Monas., RN ?Patient/Family location: Pt is home in private ?[facility] Provider location: [facility] Office ?All persons participating in visit: Pt & Clinician ?Types of Service: Individual psychotherapy ? ?I connected with Aimee Davis and/or Aimee Davis's  self  via  Telephone or Geologist, engineering  (Video is Tree surgeon) and verified that I am speaking with the correct person using two identifiers. Discussed confidentiality: Yes  ? ?I discussed the limitations of telemedicine and the availability of in person appointments.  Discussed there is a possibility of technology failure and discussed alternative modes of communication if that failure occurs. ? ?I discussed that engaging in this telemedicine visit, they consent to the provision of behavioral healthcare and the services will be billed under their insurance. ? ?Patient and/or legal guardian expressed understanding and consented to Telemedicine visit: Yes  ? ?Presenting Concerns: ?Patient and/or family reports the following symptoms/concerns: elevated anx/dep due to input from a lot of ppl today re: her needs & choices ?Duration of problem: years; Severity of problem: moderate ? ?Patient and/or Family's Strengths/Protective Factors: ?Concrete supports in place (healthy food, safe environments, etc.) and Sense of purpose ? ?Goals Addressed: ?Patient will: ? Reduce symptoms of: anxiety, depression, and stress  ? Increase knowledge and/or ability of: coping skills and stress reduction  ? Demonstrate ability to: Increase healthy adjustment to current life circumstances ? ?Progress towards Goals: ?Ongoing ? ?Interventions: ?Interventions utilized:  Supportive  Counseling ?Standardized Assessments completed:  screeners prn ? ?Patient and/or Family Response: Pt receptive to call today & requests future appt ? ?Assessment: ?Patient currently experiencing difficulty w/her older Son who has given his Mother a dwnpymt for her new vehicle. It has been 3 wks now. Pt feels she has "burnt out" her Son bc he is not speaking to her. She feels she is "losing touch w/her children". ? ?Pt took a self-inventory & was not pleased with her beh & decisions lately. She wants others to know she is there for them when they are in need. ? ?Pt stayed w/her Mother for 6 wks until she secured a car for transportation. She wants to do more to care for self. ? ?Patient may benefit from cont'd Cslg. ? ?Plan: ?Follow up with behavioral health clinician on : 3 wks on telehealth for 30 min ?Behavioral recommendations: Download calm.com ?Referral(s): [place] (In Clinic) ? ?I discussed the assessment and treatment plan with the patient and/or parent/guardian. They were provided an opportunity to ask questions and all were answered. They agreed with the plan and demonstrated an understanding of the instructions. ?  ?They were advised to call back or seek an in-person evaluation if the symptoms worsen or if the condition fails to improve as anticipated. ? ?Donnetta Hutching, LMFT ?

## 2021-12-18 ENCOUNTER — Encounter: Payer: Self-pay | Admitting: Internal Medicine

## 2021-12-18 ENCOUNTER — Ambulatory Visit (INDEPENDENT_AMBULATORY_CARE_PROVIDER_SITE_OTHER): Payer: Medicare Other | Admitting: Internal Medicine

## 2021-12-18 VITALS — BP 152/80 | HR 61 | Temp 97.6°F | Ht 63.0 in | Wt 141.6 lb

## 2021-12-18 DIAGNOSIS — R5382 Chronic fatigue, unspecified: Secondary | ICD-10-CM | POA: Diagnosis not present

## 2021-12-18 DIAGNOSIS — T8182XD Emphysema (subcutaneous) resulting from a procedure, subsequent encounter: Secondary | ICD-10-CM | POA: Diagnosis not present

## 2021-12-18 DIAGNOSIS — F4322 Adjustment disorder with anxiety: Secondary | ICD-10-CM

## 2021-12-18 DIAGNOSIS — R413 Other amnesia: Secondary | ICD-10-CM

## 2021-12-18 DIAGNOSIS — R4189 Other symptoms and signs involving cognitive functions and awareness: Secondary | ICD-10-CM

## 2021-12-18 DIAGNOSIS — J439 Emphysema, unspecified: Secondary | ICD-10-CM | POA: Diagnosis not present

## 2021-12-18 DIAGNOSIS — F332 Major depressive disorder, recurrent severe without psychotic features: Secondary | ICD-10-CM | POA: Diagnosis not present

## 2021-12-18 NOTE — Patient Instructions (Addendum)
Thank you, Ms.Nickisha C Corpuz for allowing [place] to provide your care today. Today we discussed Emphysema and brian fog/imbalance.   ? ?Labs/Tests Ordered: ?Lab Orders    ?     BMP8+Anion Gap    ?     TSH    ?     Vitamin B12     ? ?Referrals Ordered:  ?Referral Orders    ?     Ambulatory referral to Pulmonology    ?  ? ?Medication Changes:  ?There are no discontinued medications.  ? ?No orders of the defined types were placed in this encounter. ?  ? ?Health Maintenance Screening: ?There are no preventive care reminders to display for this patient.  ? ?Instructions:  ? ?Follow up: 4-6 months  ? ?Remember: If you have any questions or concerns, call our clinic at [phone] or after hours call [phone] and ask for the internal medicine resident on call. ? ?Marianna Payment, D.O. ?[place] ? ?  ?

## 2021-12-18 NOTE — Progress Notes (Signed)
? ? ?Subjective:  ?CC: emphysema ? ?HPI: ? ?Aimee Davis is a 66 y.o. female with a past medical history stated below and presents today for emphysema. Please see problem based assessment and plan for additional details. ? ?Past Medical History:  ?Diagnosis Date  ? Abdominal pain 08/16/2008  ? Qualifier: Diagnosis of  By: Hassell Done FNP, Tori Milks    ? Abdominal pain, epigastric 09/08/2019  ? Allergy   ? seasonal, PCN, antidepressants, bentyl  ? Arthritis   ? Asthma   ? Carpal tunnel syndrome on both sides   ? right worse than left 2008  ? Cataract 06/11/2013  ? COPD (chronic obstructive pulmonary disease) ([place])   ? COVID-19 virus infection 06/18/2016  ? Eczema 05/02/2018  ? GERD (gastroesophageal reflux disease)   ? Hyperlipidemia   ? LDL 108 JULY 2013  ? IBS (irritable bowel syndrome)   ? Macular rash 05/20/2019  ? Multiple nevi 03/01/2015  ? Pneumonia   ? Positive TB test   ? From MArch 2009 note : Recent F/u at [facility]. CXRAy negative.  Positive TST in 2007 (37m).  Unable to tolerate INH due to hepatotoxicity  ? ? ?Current Outpatient Medications on File Prior to Visit  ?Medication Sig Dispense Refill  ? albuterol (VENTOLIN HFA) 108 (90 Base) MCG/ACT inhaler Inhale 2 puffs into the lungs every 6 (six) hours as needed for wheezing or shortness of breath. 6.7 g 3  ? Artificial Tear Ointment (DRY EYES OP) Apply 1 drop to eye daily as needed (dry eyes).    ? Ascorbic Acid (VITAMIN C) 100 MG tablet Take 50 mg by mouth daily.    ? aspirin EC 81 MG tablet Take 1 tablet (81 mg total) by mouth daily. Swallow whole.    ? B Complex-C (B-COMPLEX WITH VITAMIN C) tablet Take 1 tablet by mouth daily.    ? cholecalciferol (VITAMIN D3) 25 MCG (1000 UNIT) tablet Take 1,000 Units by mouth daily.    ? ezetimibe (ZETIA) 10 MG tablet Take 1 tablet (10 mg total) by mouth daily. (Patient not taking: Reported on 09/06/2021) 90 tablet 1  ? fexofenadine (ALLEGRA) 180 MG tablet Take 180 mg by mouth daily.    ? Fluticasone-Umeclidin-Vilant  (TRELEGY ELLIPTA) 200-62.5-25 MCG/ACT AEPB Inhale 1 puff into the lungs daily. 60 each 3  ? lubiprostone (AMITIZA) 8 MCG capsule Take 1 capsule (8 mcg total) by mouth 2 (two) times daily with a meal. 180 capsule 1  ? Magnesium 400 MG TABS Take 2 each by mouth daily.    ? metoprolol tartrate (LOPRESSOR) 25 MG tablet Patient to take 1 tablet by mouth 2 hours prior to procedure 1 tablet 0  ? mometasone (NASONEX) 50 MCG/ACT nasal spray PLACE 2 SPRAYS IN EACH NOSTRIL ONCE DAILY AS NEEDED. 17 g 6  ? Multiple Vitamins-Minerals (MULTIVITAMIN WITH MINERALS) tablet Take 1 tablet by mouth daily.    ? omega-3 acid ethyl esters (LOVAZA) 1 g capsule Take by mouth 2 (two) times daily.    ? OVER THE COUNTER MEDICATION EO MEGA Essential Oil - 2 daily    ? pantoprazole (PROTONIX) 40 MG tablet Take 1 tablet (40 mg total) by mouth daily. 30-60 minutes before breakfast 30 tablet 5  ? Peppermint Oil (IBGARD) 90 MG CPCR Take 1 capsule by mouth 2 (two) times daily as needed. Take as directed as needed    ? predniSONE (STERAPRED UNI-PAK 21 TAB) 10 MG (21) TBPK tablet Take by mouth daily. Take 6 tabs by mouth daily  for 2 days, then 5 tabs for 2 days, then 4 tabs for 2 days, then 3 tabs for 2 days, 2 tabs for 2 days, then 1 tab by mouth daily for 2 days 42 tablet 0  ? promethazine-dextromethorphan (PROMETHAZINE-DM) 6.25-15 MG/5ML syrup Take 5 mLs by mouth 4 (four) times daily as needed for cough. 118 mL 0  ? simethicone (GAS-X) 80 MG chewable tablet Use as directed as needed 30 tablet 0  ? ?No current facility-administered medications on file prior to visit.  ? ? ?Family History  ?Problem Relation Age of Onset  ? Hypertension Mother   ? Heart disease Mother   ? Diabetes Mother   ? Arthritis Mother   ? Stroke Father   ? Diabetes Father   ? Heart disease Father   ? Hyperlipidemia Father   ? Hypertension Father   ? Kidney disease Father   ? Hypertension Sister   ? Breast cancer Sister   ? Breast cancer Sister   ? Hypertension Sister   ?  Arthritis Sister   ? Hypertension Sister   ? Diabetes Sister   ? Mental illness Brother   ? Aneurysm Brother   ?     brain  ? Arthritis Brother   ? Hypertension Maternal Aunt   ? Hypertension Maternal Uncle   ? Stomach cancer Maternal Uncle   ? Hypertension Paternal Aunt   ? Kidney disease Paternal Aunt   ?     x 2  ? Hypertension Paternal Uncle   ? Kidney disease Paternal Uncle   ?     x 2  ? Colon cancer Neg Hx   ? Esophageal cancer Neg Hx   ? Pancreatic cancer Neg Hx   ? ? ?Social History  ? ?Socioeconomic History  ? Marital status: Divorced  ?  Spouse name: Not on file  ? Number of children: 2  ? Years of education: Not on file  ? Highest education level: Not on file  ?Occupational History  ? Occupation: retired  ?Tobacco Use  ? Smoking status: Former  ?  Packs/day: 0.33  ?  Years: 43.00  ?  Pack years: 14.19  ?  Types: Cigarettes  ?  Quit date: 01/06/2013  ?  Years since quitting: 8.9  ? Smokeless tobacco: Never  ?Vaping Use  ? Vaping Use: Never used  ?Substance and Sexual Activity  ? Alcohol use: No  ?  Alcohol/week: 0.0 standard drinks  ? Drug use: Not Currently  ?  Comment: x 26 yrs.  ? Sexual activity: Not on file  ?  Comment: S/P PARTIAL HYSTERECTOMY  ?Other Topics Concern  ? Not on file  ?Social History Narrative  ? Not on file  ? ?Social Determinants of Health  ? ?Financial Resource Strain: Not on file  ?Food Insecurity: Not on file  ?Transportation Needs: Not on file  ?Physical Activity: Not on file  ?Stress: Not on file  ?Social Connections: Not on file  ?Intimate Partner Violence: Not on file  ? ? ?Review of Systems: ?ROS negative except for what is noted on the assessment and plan. ? ?Objective:  ? ?Vitals:  ? 12/18/21 1559  ?BP: (!) 152/80  ?Pulse: 61  ?Temp: 97.6 ?F (36.4 ?C)  ?TempSrc: Oral  ?SpO2: 100%  ?Weight: 141 lb 9.6 oz (64.2 kg)  ?Height: '5\' 3"'$  (1.6 m)  ? ? ?Physical Exam: ?Gen: A&O x3 and in no apparent distress, well appearing and nourished. ?CV: RRR, no murmurs, S1/S2 presents  ?Resp:  Clear to ascultation bilaterally  ?MSK: Grossly normal AROM and strength x4 extremities. ?Skin: good skin turgor, no rashes, unusual bruising, or prominent lesions.  ?Neuro: No focal deficits, grossly normal sensation and coordination.  ?Psych: Oriented x3 and responding appropriately. Difficulty with memory, depressed mood, emotional lability  ? ? ?Assessment & Plan:  ?See Encounters Tab for problem based charting. ? ?Patient discussed with Dr. Angelia Mould ? ? ?Marianna Payment, D.O. ?[facility]  PGY-3 ?Pager: [phone]  Phone: [phone] ?Date 12/19/2021  Time 1:23 PM  ?

## 2021-12-19 ENCOUNTER — Encounter: Payer: Self-pay | Admitting: Internal Medicine

## 2021-12-19 DIAGNOSIS — J439 Emphysema, unspecified: Secondary | ICD-10-CM | POA: Insufficient documentation

## 2021-12-19 DIAGNOSIS — R4189 Other symptoms and signs involving cognitive functions and awareness: Secondary | ICD-10-CM | POA: Insufficient documentation

## 2021-12-19 LAB — BMP8+ANION GAP
Anion Gap: 16 mmol/L (ref 10.0–18.0)
BUN/Creatinine Ratio: 8 — ABNORMAL LOW (ref 12–28)
BUN: 7 mg/dL — ABNORMAL LOW (ref 8–27)
CO2: 22 mmol/L (ref 20–29)
Calcium: 10.3 mg/dL (ref 8.7–10.3)
Chloride: 101 mmol/L (ref 96–106)
Creatinine, Ser: 0.83 mg/dL (ref 0.57–1.00)
Glucose: 98 mg/dL (ref 70–99)
Potassium: 4.1 mmol/L (ref 3.5–5.2)
Sodium: 139 mmol/L (ref 134–144)
eGFR: 77 mL/min/{1.73_m2} (ref >59)

## 2021-12-19 LAB — TSH: TSH: 1.63 u[IU]/mL (ref 0.450–4.500)

## 2021-12-19 LAB — VITAMIN B12: Vitamin B-12: 1054 pg/mL (ref 232–1245)
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
## 2021-12-19 NOTE — Assessment & Plan Note (Signed)
Patient presents for a follow-up appointment since her CT scan was performed.  We discussed the results which showed emphysema.  She has a longstanding history of shortness of breath that was thought to be due to asthma versus COPD.  Previous PFTs were performed showing FEV1 of 61% with a mixed obstructive and restrictive pattern.  Patient endorses a prolonged history of smoking but has been quit for 67 years.  She is currently on Trelegy but states that she still has symptoms daily and does not feel as though her symptoms are well controlled.  She endorses chronic mucus production but no significant cough.  States that her symptoms do affect her ability to exert herself.  She denies any signs or symptoms of systemic inflammation or any significant findings concerning for an exacerbation of her underlying emphysema. ? ?Plan: ?Considering patient's symptoms are suboptimally controlled on Trelegy, I will refer her to pulmonology for further evaluation and management. ?

## 2021-12-20 ENCOUNTER — Telehealth: Payer: Self-pay | Admitting: Internal Medicine

## 2021-12-20 NOTE — Progress Notes (Signed)
Internal Medicine Clinic Attending  Case discussed with Dr. Coe  At the time of the visit.  We reviewed the resident's history and exam and pertinent patient test results.  I agree with the assessment, diagnosis, and plan of care documented in the resident's note.  

## 2021-12-20 NOTE — Telephone Encounter (Signed)
?  Reason for call:   ?I placed an outgoing call to [name] at 8:35 AM regarding lab results. The call was unsuccessfully due to patient was unavailable . I was calling to discuss her recent lab results and counsel her regarding SSRI treatment for depression. She was interested and hear to risk and benefits of specific treatments.  ? ?Assessment/ Plan:  ?Left HIPPA compliant voicemail. ?As always, pt was advised that if symptoms worsen or new symptoms arise, they should go to an urgent care facility or to to ER for further evaluation. ? ?Signature: ?Aimee Davis, D.O.  ?Internal Medicine Resident, PGY-3 ?[facility]  ?Pager: [phone] ?8:35 AM, 12/20/2021  ? ? ? ? ? ? ?

## 2021-12-21 ENCOUNTER — Telehealth: Payer: Self-pay

## 2021-12-21 NOTE — Telephone Encounter (Signed)
Patient returned your call regarding her lab results. ?

## 2021-12-26 ENCOUNTER — Other Ambulatory Visit: Payer: Self-pay | Admitting: Internal Medicine

## 2021-12-26 MED ORDER — MIRTAZAPINE 15 MG PO TABS: 15.0000 mg | ORAL_TABLET | Freq: Every day | ORAL | 0 refills | Status: DC

## 2021-12-26 NOTE — Addendum Note (Signed)
Addended by: Lawerance Cruel on: 12/26/2021 12:34 PM ? ? Modules accepted: Orders ? ?

## 2021-12-26 NOTE — Telephone Encounter (Signed)
I called patient and discussed with her the results of her recent CT chest in February.

## 2021-12-28 ENCOUNTER — Telehealth: Payer: Self-pay | Admitting: Student

## 2021-12-28 ENCOUNTER — Telehealth: Payer: Self-pay | Admitting: Internal Medicine

## 2021-12-28 ENCOUNTER — Telehealth: Payer: Self-pay | Admitting: *Deleted

## 2021-12-28 NOTE — Telephone Encounter (Signed)
Call from pt - stated she was told if she has any problem with the new medication, Mirtazapine 15 mg to call the office. She took it x 1 night; stated she slept from 1130 Am to 2 AM and when she woke up she was confused and groggy throughout the day. She wants to know what should she do now? ?

## 2021-12-28 NOTE — Telephone Encounter (Signed)
Patient paged the on-call team noting that after starting mirtazapine for her anxiety and depression on 4/11 PM that she slept from 11:30 PM until 2 PM the following afternoon (4/12).  She states that she has never slept this long even when her depression was not well controlled.  Discussed with patient that she can likely be transition to a different antidepressant that she can call our clinic later in the a.m. and the resident who prescribed that medication would be available.  She was agreeable to this plan.   ?

## 2021-12-28 NOTE — Telephone Encounter (Signed)
Called patient regarding medication side effect. See chart note for additional details.

## 2022-01-01 ENCOUNTER — Other Ambulatory Visit: Payer: Self-pay | Admitting: Internal Medicine

## 2022-01-01 DIAGNOSIS — F419 Anxiety disorder, unspecified: Secondary | ICD-10-CM

## 2022-01-01 DIAGNOSIS — R4189 Other symptoms and signs involving cognitive functions and awareness: Secondary | ICD-10-CM

## 2022-01-01 MED ORDER — DULOXETINE HCL 30 MG PO CPEP
30.0000 mg | ORAL_CAPSULE | Freq: Every day | ORAL | 2 refills | Status: DC
Start: 2022-01-01 — End: 2022-03-21

## 2022-01-01 NOTE — Progress Notes (Signed)
Patient states that she was not able to tolerate mirtazapine due to oversedation. She has tried multiple SSRI in the past without success. Will try cymbalta 30 mg daily. Will triturate medication as needed.  ?

## 2022-01-03 ENCOUNTER — Telehealth: Payer: Self-pay | Admitting: *Deleted

## 2022-01-03 NOTE — Telephone Encounter (Signed)
Patient walked in hoping to speak with Dr. Marianna Payment. States she has not started Cymbalta because " I have a fear of it." Became teary when relaying this. States she was on a similar med in the past and had a bad reaction to it that took her > a year to recover from. She understands Dr. Marianna Payment is in clinic so she is requesting call back from Dr. Marianna Payment as soon as he is able. Understands it may be tomorrow.  ?

## 2022-01-05 ENCOUNTER — Telehealth: Payer: Self-pay | Admitting: Internal Medicine

## 2022-01-05 DIAGNOSIS — R4189 Other symptoms and signs involving cognitive functions and awareness: Secondary | ICD-10-CM

## 2022-01-05 NOTE — Telephone Encounter (Signed)
Spoke to patient at length regarding trying to find an antidepressant to help her symptoms. She states that she is allergic to all antidepressants and stated that she had a bad reaction to them in the past. She feels like her cognitive impairment is due to her COVID infection from 2020. She is requesting to see a neurologist regarding her symptoms.  ?

## 2022-01-11 ENCOUNTER — Ambulatory Visit: Payer: Medicare Other | Admitting: Behavioral Health

## 2022-01-11 DIAGNOSIS — F419 Anxiety disorder, unspecified: Secondary | ICD-10-CM

## 2022-01-11 DIAGNOSIS — F4322 Adjustment disorder with anxiety: Secondary | ICD-10-CM

## 2022-01-11 DIAGNOSIS — F331 Major depressive disorder, recurrent, moderate: Secondary | ICD-10-CM

## 2022-01-25 NOTE — BH Specialist Note (Signed)
Integrated Behavioral Health via Telemedicine Visit ? ?01/25/2022 ?Aimee Davis ?[id number] ? ?Number of [place] Clinician visits: 8 ?Session Start time: 1100 orig'ly; Pt requests Clinician's 2:00pm time if avail-r/s'd Pt ?Session End time: 1400-1500 ?Total time in minutes: 60 min ? ?Referring Provider: Almeta Monas., RN  ?Patient/Family location: Pt is home in private ?[facility] Provider location: Remote ?All persons participating in visit: Pt & Clinician ?Types of Service: Individual psychotherapy ? ?I connected with Aimee Davis and/or Cyndia Skeeters Krupinski's  self  via  Telephone or Geologist, engineering  (Video is Tree surgeon) and verified that I am speaking with the correct person using two identifiers. Discussed confidentiality: Yes  ? ?I discussed the limitations of telemedicine and the availability of in person appointments.  Discussed there is a possibility of technology failure and discussed alternative modes of communication if that failure occurs. ? ?I discussed that engaging in this telemedicine visit, they consent to the provision of behavioral healthcare and the services will be billed under their insurance. ? ?Patient and/or legal guardian expressed understanding and consented to Telemedicine visit: Yes  ? ?Presenting Concerns: ?Patient and/or family reports the following symptoms/concerns: Pt feels scattered, lacking focus & conc, Pt has c/o [place] COVID Sx for months ?Duration of problem: almost a year ; Severity of problem: moderate; Pt mental state vascillates. When she has her Sons support, she seems to gain stability. Pt MRI was wnl.  ? ?Pt stayed w/her Mother for 6 wks during time she had no transportation. Her car has since been fixed with her Son's help & she has returned home.  ? ?Pt describes an over-sensitivity to her Family's comments about her beh. Pt sensitivity levels trend to suspicion. Pt has low levels of self-esteem currently. She is not  working & this provides meaning.  ? ?Pt wanting to handle her anx/dep to have a better life. She does not believe in medication.  ? ?Patient and/or Family's Strengths/Protective Factors: ?Social connections and Concrete supports in place (healthy food, safe environments, etc.) ? ?Goals Addressed: ?Patient will: ? Reduce symptoms of: anxiety, depression, mood instability, and stress  ? Increase knowledge and/or ability of: coping skills, healthy habits, self-management skills, stress reduction, and skills for emot'l regulation & attn to health issues/health literacy.   ? Demonstrate ability to: Increase healthy adjustment to current life circumstances, Increase adequate support systems for patient/family, and Increase motivation to adhere to plan of care ? ?Progress towards Goals: ?Ongoing ? ?Interventions: ?Interventions utilized:  Solution-Focused Strategies, Behavioral Activation, Supportive Counseling, and Psychoeducation and/or Health Education ?Standardized Assessments completed:  screeners prn ? ?Patient and/or Family Response: Pt is receptive to call but requested time change that was possible today on Clinician's schedule. ? ?Assessment: ?Patient currently experiencing elevated concerns for her Px & Mtl Hlth well-being. Pt is trying to commit to self-care practices that include the gym, walking, & doing activities that support her self-esteem.  ? ?Patient may benefit from cont'd Cslg, exer, & relief from the anx/dep she feels over health status changes related to having had COVID-19.. ? ?Plan: ?Follow up with behavioral health clinician on : 2-3 wks for 30 min telehealth session ?Behavioral recommendations: Start slow @ the Gym, be consistent. Keep contact w/those ppl who support you & build you up. Remember, our relationship w/our grown Adult Children changes over time.  ?Referral(s): [place] (In Clinic), Psychiatrist, and evaluation for Cog deficits due to her intense concern  over brain issues. Emphasized to Pt  she needs to mention this for discussion @ her next PCP visit.  ? ?I discussed the assessment and treatment plan with the patient and/or parent/guardian. They were provided an opportunity to ask questions and all were answered. They agreed with the plan and demonstrated an understanding of the instructions. ?  ?They were advised to call back or seek an in-person evaluation if the symptoms worsen or if the condition fails to improve as anticipated. ? ?Donnetta Hutching, LMFT ?

## 2022-01-30 ENCOUNTER — Other Ambulatory Visit: Payer: Self-pay

## 2022-01-30 DIAGNOSIS — E7800 Pure hypercholesterolemia, unspecified: Secondary | ICD-10-CM

## 2022-01-30 DIAGNOSIS — I2510 Atherosclerotic heart disease of native coronary artery without angina pectoris: Secondary | ICD-10-CM

## 2022-01-30 DIAGNOSIS — E785 Hyperlipidemia, unspecified: Secondary | ICD-10-CM

## 2022-01-30 DIAGNOSIS — Z79899 Other long term (current) drug therapy: Secondary | ICD-10-CM

## 2022-01-30 MED ORDER — EZETIMIBE 10 MG PO TABS: 10.0000 mg | ORAL_TABLET | Freq: Every day | ORAL | 2 refills | Status: DC

## 2022-02-02 NOTE — Telephone Encounter (Signed)
Encounter opened by error.

## 2022-02-08 ENCOUNTER — Ambulatory Visit: Payer: Medicare Other | Admitting: Behavioral Health

## 2022-02-08 DIAGNOSIS — F419 Anxiety disorder, unspecified: Secondary | ICD-10-CM

## 2022-02-08 DIAGNOSIS — F330 Major depressive disorder, recurrent, mild: Secondary | ICD-10-CM

## 2022-02-08 NOTE — BH Specialist Note (Signed)
Integrated Behavioral Health via Telemedicine Visit  02/08/2022 Aimee Davis [id number]  Number of [place] Clinician visits: 9 Session Start time: 1100 Session End time: 1130 Total time in minutes: 30 min  Referring Provider: Almeta Monas., RN Patient/Family location: Pt is out running errands [facility] Provider location: [facility] Office All persons participating in visit: Pt & Clinician Types of Service: Individual psychotherapy  I connected with Aimee Davis and/or Aimee Davis's  self  via  Telephone or Geologist, engineering  (Video is Tree surgeon) and verified that I am speaking with the correct person using two identifiers. Discussed confidentiality: Yes   I discussed the limitations of telemedicine and the availability of in person appointments.  Discussed there is a possibility of technology failure and discussed alternative modes of communication if that failure occurs.  I discussed that engaging in this telemedicine visit, they consent to the provision of behavioral healthcare and the services will be billed under their insurance.  Patient and/or legal guardian expressed understanding and consented to Telemedicine visit: Yes   Presenting Concerns: Patient and/or family reports the following symptoms/concerns: dec in anx/dep since d/c'g her psychopharmacologicals; Pt sts, "I like where I'm at!" Pt is waking early, running errands, doing her life, going to the Gym several times/week, re-connected w/her Family, & she attends a [employer] monthly to share fellowship.  Pt is not taking her mental health medications & expresses how well she is doing today. She has multiple supports in place. Her Mother has pointed out how much better she sees Pt doing.   Duration of problem: yrs of struggle, now Pt has taken charge of her life again; Severity of problem: moderate  Patient and/or Family's Strengths/Protective Factors: Social connections,  Concrete supports in place (healthy food, safe environments, etc.), Sense of purpose, and Physical Health (exercise, healthy diet, medication compliance, etc.)  Goals Addressed: Patient will:  Reduce symptoms of: anxiety and depression   Increase knowledge and/or ability of: coping skills   Demonstrate ability to: Increase healthy adjustment to current life circumstances  Progress towards Goals: Pt has agreed she does not currently need Therapy. She understands she can reach out in the future if she needs to do so. Pt agrees & consents to currently terminate Therapy.  Interventions: Interventions utilized:  Supportive Reflection Standardized Assessments completed: Not Needed  Patient and/or Family Response: Pt receptive to call today & terminated the discussion after 10 min.  Assessment: Patient currently experiencing a positive boost in her mood & self-efficacy efforts as she has worked to make life more positive.   Patient may benefit from cont'g her positive attitude & belief in herself.   Plan: Follow up with behavioral health clinician on : None needed Behavioral recommendations: None today-cont your current efforts Referral(s):  None today  I discussed the assessment and treatment plan with the patient and/or parent/guardian. They were provided an opportunity to ask questions and all were answered. They agreed with the plan and demonstrated an understanding of the instructions.   They were advised to call back or seek an in-person evaluation if the symptoms worsen or if the condition fails to improve as anticipated.  Donnetta Hutching, LMFT

## 2022-02-27 ENCOUNTER — Ambulatory Visit: Payer: Medicare Other | Admitting: Cardiology

## 2022-03-02 ENCOUNTER — Telehealth: Payer: Self-pay | Admitting: Gastroenterology

## 2022-03-02 NOTE — Telephone Encounter (Signed)
Patient called at 4:56pm. She states she is having severe periumbilical abdominal pain, she thinks it could be from something she ate, the abdominal pain is also associated with 2 loose stools. She has tried IBGard and imodium. I advised if the symptoms worsen or don't resolve to report to the ER. Told her with loose stools to rehydrate. She informs me she has been pushing ginger tea today. I told Aimee Davis the nurse would check back in with her Monday.

## 2022-03-03 ENCOUNTER — Telehealth: Payer: Self-pay | Admitting: Student

## 2022-03-03 NOTE — Telephone Encounter (Signed)
Patient called after hours regarding abdominal pain for the last 2-3 days. Pain waxes and wanes in the periumbilical and RUQ. Pain is associated with 3 episodes of diarrhea. Feels pain is worse with eating. Now is having chills. Does not have a thermometer at home and unable to to check. She is able to tolerate fluids and small amounts of food but has decreased appetite. Reports balance issues but no dizziness or falls. She has been taking imodium and IBgard. She lives alone, but son live nearby. Discussed her chills may be due to fever. RUQ pain associated with eating concerning for biliary etiology. Discussed being evaluated in ED for her symptoms. She is hesitant about going to ED as she feels abdominal pain is somewhat improved from this morning. Urged her to be evaluated in ED, but if she is unwilling advised she stay with son to keep and eye on her, and if symptoms persist or worsening to go for evaluation in ED. Patient agreeable with this.

## 2022-03-03 NOTE — Telephone Encounter (Signed)
Opened in error

## 2022-03-05 ENCOUNTER — Other Ambulatory Visit: Payer: Self-pay

## 2022-03-05 ENCOUNTER — Other Ambulatory Visit: Payer: Self-pay | Admitting: Student

## 2022-03-05 DIAGNOSIS — E7800 Pure hypercholesterolemia, unspecified: Secondary | ICD-10-CM

## 2022-03-05 DIAGNOSIS — I2510 Atherosclerotic heart disease of native coronary artery without angina pectoris: Secondary | ICD-10-CM

## 2022-03-05 DIAGNOSIS — Z79899 Other long term (current) drug therapy: Secondary | ICD-10-CM

## 2022-03-05 DIAGNOSIS — E785 Hyperlipidemia, unspecified: Secondary | ICD-10-CM

## 2022-03-05 MED ORDER — EZETIMIBE 10 MG PO TABS: 10.0000 mg | ORAL_TABLET | Freq: Every day | ORAL | 1 refills | Status: DC

## 2022-03-05 NOTE — Telephone Encounter (Signed)
Spoke with pt and she reports she did not go to the ER. She used a heating pad on her abdomen, took IBgard and protonix and she improved. She thinks she may have had a virus. Dr. Havery Moros aware.

## 2022-03-05 NOTE — Telephone Encounter (Signed)
Thank you Vaughan Basta, I appreciate the follow up

## 2022-03-05 NOTE — Telephone Encounter (Signed)
Thanks for the note.   Aimee Davis can you please contact the patient today and see how she is doing and if she went to the ED or not? Thanks

## 2022-03-11 ENCOUNTER — Other Ambulatory Visit: Payer: Self-pay | Admitting: Student

## 2022-03-11 ENCOUNTER — Other Ambulatory Visit: Payer: Self-pay | Admitting: Gastroenterology

## 2022-03-19 ENCOUNTER — Telehealth: Payer: Self-pay | Admitting: Student

## 2022-03-19 NOTE — Telephone Encounter (Signed)
RTC to patient message left that the Clinics has returned her call.  Has been scheduled for an appointment on 03/21/2022 at 9:45 AM.

## 2022-03-19 NOTE — Telephone Encounter (Signed)
Pt requesting a call back.  Pt states she is having a really hard time getting up out of bed and feels really fatigued.

## 2022-03-21 ENCOUNTER — Encounter: Payer: Self-pay | Admitting: Internal Medicine

## 2022-03-21 ENCOUNTER — Ambulatory Visit (INDEPENDENT_AMBULATORY_CARE_PROVIDER_SITE_OTHER): Payer: Medicare Other | Admitting: Internal Medicine

## 2022-03-21 VITALS — BP 129/75 | HR 68 | Temp 97.6°F | Ht 63.0 in | Wt 143.0 lb

## 2022-03-21 DIAGNOSIS — R5382 Chronic fatigue, unspecified: Secondary | ICD-10-CM

## 2022-03-21 DIAGNOSIS — K8689 Other specified diseases of pancreas: Secondary | ICD-10-CM | POA: Insufficient documentation

## 2022-03-21 DIAGNOSIS — N3281 Overactive bladder: Secondary | ICD-10-CM | POA: Diagnosis not present

## 2022-03-21 DIAGNOSIS — E559 Vitamin D deficiency, unspecified: Secondary | ICD-10-CM

## 2022-03-21 NOTE — Patient Instructions (Addendum)
Thank you, Ms.Momo C Lingard for allowing [place] to provide your care today. Today we discussed:  Fatigue- I think this could be related to not getting good sleep. We also did blood pressure check with you sitting and standing and your blood pressure was lower. I included some educational information of bladder training and orthostatic low blood pressure.  Screening- With your history of vitamin D deficiency I am getting a scan to look at how thick your bones are.  I have ordered the following labs for you:  Lab Orders  No laboratory test(s) ordered today      Referrals ordered today:   Referral Orders  No referral(s) requested today     I have ordered the following medication/changed the following medications:   Stop the following medications: Medications Discontinued During This Encounter  Medication Reason   predniSONE (STERAPRED UNI-PAK 21 TAB) 10 MG (21) TBPK tablet Completed Course   simethicone (GAS-X) 80 MG chewable tablet Completed Course   metoprolol tartrate (LOPRESSOR) 25 MG tablet Completed Course   DULoxetine (CYMBALTA) 30 MG capsule Discontinued by provider   promethazine-dextromethorphan (PROMETHAZINE-DM) 6.25-15 MG/5ML syrup Completed Course     Start the following medications: No orders of the defined types were placed in this encounter.    Follow up:  4 weeks    Remember: please review packet and try lifestyle changes to improve bladder symptoms.  We look forward to seeing you next time. Please call our clinic at [phone] if you have any questions or concerns. The best time to call is Monday-Friday from 9am-4pm, but there is someone available 24/7. If after hours or the weekend, call the main hospital number and ask for the Internal Medicine Resident On-Call. If you need medication refills, please notify your pharmacy one week in advance and they will send [place] a request.   Thank you for trusting me with your care. Wishing you the best!   Christiana Fuchs,  [place]

## 2022-03-21 NOTE — Assessment & Plan Note (Signed)
Patient with history of vitamin D deficiency.  She reports taking supplements currently. A/P: -DEXA scan to evaluate for osteoporosis

## 2022-03-21 NOTE — Progress Notes (Signed)
Subjective:  CC: fatigue  HPI:  Aimee Davis is a 66 y.o. female with a past medical history stated below and presents today for fatigue. Please see problem based assessment and plan for additional details.  Past Medical History:  Diagnosis Date   Abdominal pain 08/16/2008   Qualifier: Diagnosis of  By: Hassell Done FNP, [name]     Abdominal pain, epigastric 09/08/2019   Allergy    seasonal, PCN, antidepressants, bentyl   Aortic atherosclerosis ([place]) 02/23/2021   Arthritis    Asthma    Asymptomatic bacteriuria 08/24/2021   UA showed leukocytes but patient does not have dysuria, fevers or hematuria. She also had bacteruria in her last UA. Her symptoms of fatigue, and loss of appetite are non-specific but if persist or she begins having dysuria we can work up further. A UTI would not explain the abdominal pain patient is having.    Carpal tunnel syndrome on both sides    right worse than left 2008   Cataract 06/11/2013   COPD (chronic obstructive pulmonary disease) ([place])    COVID-19 virus infection 06/18/2016   Eczema 05/02/2018   Food poisoning 04/04/2021   GERD (gastroesophageal reflux disease)    Hair loss 07/07/2019   Hyperlipidemia    LDL 108 JULY 2013   IBS (irritable bowel syndrome)    Macular rash 05/20/2019   Multiple nevi 03/01/2015   Pneumonia    Positive TB test    From [facility] 2009 note : Recent F/u at [facility]. CXRAy negative.  Positive TST in 2007 (1m).  Unable to tolerate INH due to hepatotoxicity   Primary insomnia 06/05/2012   Skin macule 04/21/2019   Tinnitus 07/07/2019   URI (upper respiratory infection) 11/01/2010   Qualifier: Diagnosis of  By: MHassell DoneFNP, [name]     Current Outpatient Medications on File Prior to Visit  Medication Sig Dispense Refill   albuterol (VENTOLIN HFA) 108 (90 Base) MCG/ACT inhaler USE 2 INHALATIONS BY MOUTH EVERY 6 HOURS AS NEEDED FOR WHEEZING  OR SHORTNESS OF BREATH 26.8 g 2   Artificial Tear Ointment (DRY EYES OP) Apply 1 drop to  eye daily as needed (dry eyes).     Ascorbic Acid (VITAMIN C) 100 MG tablet Take 50 mg by mouth daily.     aspirin EC 81 MG tablet Take 1 tablet (81 mg total) by mouth daily. Swallow whole.     B Complex-C (B-COMPLEX WITH VITAMIN C) tablet Take 1 tablet by mouth daily.     cholecalciferol (VITAMIN D3) 25 MCG (1000 UNIT) tablet Take 1,000 Units by mouth daily.     ezetimibe (ZETIA) 10 MG tablet Take 1 tablet (10 mg total) by mouth daily. 90 tablet 1   fexofenadine (ALLEGRA) 180 MG tablet Take 180 mg by mouth daily.     lubiprostone (AMITIZA) 8 MCG capsule Take 1 capsule (8 mcg total) by mouth 2 (two) times daily with a meal. 180 capsule 1   Magnesium 400 MG TABS Take 2 each by mouth daily.     mometasone (NASONEX) 50 MCG/ACT nasal spray PLACE 2 SPRAYS IN EACH NOSTRIL ONCE DAILY AS NEEDED. 17 g 6   Multiple Vitamins-Minerals (MULTIVITAMIN WITH MINERALS) tablet Take 1 tablet by mouth daily.     omega-3 acid ethyl esters (LOVAZA) 1 g capsule Take by mouth 2 (two) times daily.     OVER THE COUNTER MEDICATION EO [place] Essential Oil - 2 daily     pantoprazole (PROTONIX) 40 MG tablet TAKE 1  TABLET BY MOUTH DAILY 30  TO 60 MINUTES BEFORE BREAKFAST 100 tablet 2   Peppermint Oil (IBGARD) 90 MG CPCR Take 1 capsule by mouth 2 (two) times daily as needed. Take as directed as needed     TRELEGY ELLIPTA 200-62.5-25 MCG/ACT AEPB USE 1 INHALATION BY MOUTH ONCE  DAILY 180 each 3   No current facility-administered medications on file prior to visit.    Family History  Problem Relation Age of Onset   Hypertension Mother    Heart disease Mother    Diabetes Mother    Arthritis Mother    Stroke Father    Diabetes Father    Heart disease Father    Hyperlipidemia Father    Hypertension Father    Kidney disease Father    Hypertension Sister    Breast cancer Sister    Breast cancer Sister    Hypertension Sister    Arthritis Sister    Hypertension Sister    Diabetes Sister    Mental illness Brother     Aneurysm Brother        brain   Arthritis Brother    Hypertension Maternal Aunt    Hypertension Maternal Uncle    Stomach cancer Maternal Uncle    Hypertension Paternal Aunt    Kidney disease Paternal Aunt        x 2   Hypertension Paternal Uncle    Kidney disease Paternal Uncle        x 2   Colon cancer Neg Hx    Esophageal cancer Neg Hx    Pancreatic cancer Neg Hx     Social History   Socioeconomic History   Marital status: Divorced    Spouse name: Not on file   Number of children: 2   Years of education: Not on file   Highest education level: Not on file  Occupational History   Occupation: retired  Tobacco Use   Smoking status: Former    Packs/day: 0.33    Years: 43.00    Total pack years: 14.19    Types: Cigarettes    Quit date: 01/06/2013    Years since quitting: 9.2   Smokeless tobacco: Never  Vaping Use   Vaping Use: Never used  Substance and Sexual Activity   Alcohol use: No    Alcohol/week: 0.0 standard drinks of alcohol   Drug use: Not Currently    Comment: x 26 yrs.   Sexual activity: Not on file    Comment: S/P PARTIAL HYSTERECTOMY  Other Topics Concern   Not on file  Social History Narrative   Not on file   Social Determinants of Health   Financial Resource Strain: Not on file  Food Insecurity: Not on file  Transportation Needs: Not on file  Physical Activity: Not on file  Stress: Not on file  Social Connections: Not on file  Intimate Partner Violence: Not on file    Review of Systems: ROS negative except for what is noted on the assessment and plan.  Objective:   Vitals:   03/21/22 1014 03/21/22 1055 03/21/22 1056 03/21/22 1059  BP: 129/75     Pulse: 68     Temp: 97.6 F (36.4 C)     TempSrc: Oral     SpO2: 100% 100% 100% 100%  Weight: 143 lb (64.9 kg)     Height: '5\' 3"'$  (1.6 m)       Physical Exam: Constitutional: well-appearing, sitting in chair Cardiovascular: regular rate and rhythm, no m/r/g Pulmonary/Chest:  normal work  of breathing on room air, lungs clear to auscultation bilaterally Abdominal: soft, non-tender, non-distended MSK: normal bulk and tone, muscle strength is 5 out of 5 in bilateral upper and lower extremities Psych: normal mood and affect     Assessment & Plan:  Chronic fatigue Patient presents with 3 weeks of worsening fatigue.  This is a chronic issue that has been happening off and on for the last several years.  She was most recently evaluated in March of this year due to difficulty concentrating, feeling scattered, and difficulty with remembering things.  BMP, TSH, and vitamin B12 were within normal limits at that time.  She notices fatigue symptoms worst in the mornings and has difficulty making herself get out of bed.  She has also noticed having to use the bathroom at night with increasing frequency over the last several weeks.  She denies dysuria and incontinence.  She denies feelings of depression and does not think that this is related to fatigue.  Her weight is stable from prior visit and year before.  She is concerned that she has been losing weight as she often tries to avoid cooking.  She states that she does drink plenty of fluids.  A/P: Broad differential for fatigue.  Recent TSH and vitamin B12 were within in normal limits.  Fatigue could also be related to adrenal insufficiency, she does endorse occasionally feeling dizzy when standing.  I think more likely that interrupted sleep is contributing to fatigue.  She has been getting up 2-3 times to urinate for the last several weeks. -orthostatics negative -Discussed sleep hygiene -Follow-up in 4 weeks  Overactive bladder Patient reports increased urinary frequency over the last several weeks.  She has been going 2-3 times overnight and 4-5 times during the day.  She is able to make it to the bathroom, denies difficulty initiating stream.  Denies incontinence with coughing, sneezing, or laughing. A/P: Symptoms consistent with  overactive bladder.  Patient is on multiple supplemental medications in addition to prescribed medications.  I would like to attempt a conservative treatments for overactive bladder prior to adding antimuscarinic therapy.  She is currently taking peppermint oil pills which can decrease sphincter tone.  We talked about decreasing use of peppermint oil to see if this helps with symptoms.    Vitamin D deficiency Patient with history of vitamin D deficiency.  She reports taking supplements currently. A/P: -DEXA scan to evaluate for osteoporosis  Pancreatic mass Patient will need repeat imaging of incidentally found pancreatic lesion.  This was a 3 mm hypodensity over the body of the pancreas.  She will need follow-up MRCP versus CT in 2024.   Patient discussed with Dr. Juluis Rainier Jeannifer Drakeford, D.O. [facility]  PGY-2 Pager: [phone]  Phone: [phone] Date 03/21/2022  Time 8:27 PM

## 2022-03-21 NOTE — Assessment & Plan Note (Addendum)
Patient presents with 3 weeks of worsening fatigue.  This is a chronic issue that has been happening off and on for the last several years.  She was most recently evaluated in March of this year due to difficulty concentrating, feeling scattered, and difficulty with remembering things.  BMP, TSH, and vitamin B12 were within normal limits at that time.  She notices fatigue symptoms worst in the mornings and has difficulty making herself get out of bed.  She has also noticed having to use the bathroom at night with increasing frequency over the last several weeks.  She denies dysuria and incontinence.  She denies feelings of depression and does not think that this is related to fatigue.  Her weight is stable from prior visit and year before.  She is concerned that she has been losing weight as she often tries to avoid cooking.  She states that she does drink plenty of fluids.  A/P: Broad differential for fatigue.  Recent TSH and vitamin B12 were within in normal limits.  Fatigue could also be related to adrenal insufficiency, she does endorse occasionally feeling dizzy when standing.  I think more likely that interrupted sleep is contributing to fatigue.  She has been getting up 2-3 times to urinate for the last several weeks. -orthostatics negative -Discussed sleep hygiene -Follow-up in 4 weeks

## 2022-03-21 NOTE — Assessment & Plan Note (Signed)
Patient reports increased urinary frequency over the last several weeks.  She has been going 2-3 times overnight and 4-5 times during the day.  She is able to make it to the bathroom, denies difficulty initiating stream.  Denies incontinence with coughing, sneezing, or laughing. A/P: Symptoms consistent with overactive bladder.  Patient is on multiple supplemental medications in addition to prescribed medications.  I would like to attempt a conservative treatments for overactive bladder prior to adding antimuscarinic therapy.  She is currently taking peppermint oil pills which can decrease sphincter tone.  We talked about decreasing use of peppermint oil to see if this helps with symptoms.

## 2022-03-21 NOTE — Assessment & Plan Note (Signed)
Patient will need repeat imaging of incidentally found pancreatic lesion.  This was a 3 mm hypodensity over the body of the pancreas.  She will need follow-up MRCP versus CT in 2024.

## 2022-03-22 NOTE — Progress Notes (Unsigned)
Cardiology Office Note:    Date:  03/25/2022   ID:  DARCY BARBARA, DOB 1956-05-08, MRN [id number]  PCP:  Lacinda Axon, MD   [place]  Cardiologist:  Freada Bergeron, MD  Advanced Practice Provider:  No care team member to display Electrophysiologist:  None   Referring MD: Lacinda Axon, MD    History of Present Illness:    Aimee Davis is a 66 y.o. female with a hx of chronic fatigue, GERD and asthma who presents for follow-up.   Patient last seen in clinic where she was being seen for DOE and chest pain. Coronary CTA 11/2020 with minimal disease in RCA. Ca score 90 (83% for age and sex matched control). TTE 12/2020 with LVEF 60-65%, normal RV, no significant valve disease. It was deemed her symptoms were not cardiac in nature.  Was last seen in clinic on 08/2021 where she was struggling with IBS but was stable from a CV standpoint. We started her on zetia due to statin intolerance and she declined PCSK9i.   Today, the patient overall feels okay. IBS symptoms have improved. Has been struggling with insomnia. No chest pain, lightheadedness, dizziness, LE edema, orthopnea or PND. Continues to have dyspnea on exertion which is chronic. Asthma/COPD flares with the heat and she has been using her inhalers which help some.   Family History: Father with CAD with 2 Mis; sister with CAD, ESRD (age 70), mother HTN, sister HTN  Past Medical History:  Diagnosis Date   Abdominal pain 08/16/2008   Qualifier: Diagnosis of  By: Hassell Done FNP, [name]     Abdominal pain, epigastric 09/08/2019   Allergy    seasonal, PCN, antidepressants, bentyl   Aortic atherosclerosis ([place]) 02/23/2021   Arthritis    Asthma    Asymptomatic bacteriuria 08/24/2021   UA showed leukocytes but patient does not have dysuria, fevers or hematuria. She also had bacteruria in her last UA. Her symptoms of fatigue, and loss of appetite are non-specific but if persist or she begins  having dysuria we can work up further. A UTI would not explain the abdominal pain patient is having.    Carpal tunnel syndrome on both sides    right worse than left 2008   Cataract 06/11/2013   COPD (chronic obstructive pulmonary disease) ([place])    COVID-19 virus infection 06/18/2016   Eczema 05/02/2018   Food poisoning 04/04/2021   GERD (gastroesophageal reflux disease)    Hair loss 07/07/2019   Hyperlipidemia    LDL 108 JULY 2013   IBS (irritable bowel syndrome)    Macular rash 05/20/2019   Multiple nevi 03/01/2015   Pneumonia    Positive TB test    From MArch 2009 note : Recent F/u at [facility]. CXRAy negative.  Positive TST in 2007 (15m).  Unable to tolerate INH due to hepatotoxicity   Primary insomnia 06/05/2012   Skin macule 04/21/2019   Tinnitus 07/07/2019   URI (upper respiratory infection) 11/01/2010   Qualifier: Diagnosis of  By: MHassell DoneFNP, [name]     Past Surgical History:  Procedure Laterality Date   BREAST CYST EXCISION Right 2011   BREAST EXCISIONAL BIOPSY     benign   BREAST EXCISIONAL BIOPSY Right 1990   benign   INDUCED ABORTION  2005   VAGINAL HYSTERECTOMY  1983   partial; for abnormal bleeding    Current Medications: Current Meds  Medication Sig   albuterol (VENTOLIN HFA) 108 (90 Base) MCG/ACT inhaler  USE 2 INHALATIONS BY MOUTH EVERY 6 HOURS AS NEEDED FOR WHEEZING  OR SHORTNESS OF BREATH   Artificial Tear Ointment (DRY EYES OP) Apply 1 drop to eye daily as needed (dry eyes).   Ascorbic Acid (VITAMIN C) 100 MG tablet Take 50 mg by mouth daily.   B Complex-C (B-COMPLEX WITH VITAMIN C) tablet Take 1 tablet by mouth daily.   cholecalciferol (VITAMIN D3) 25 MCG (1000 UNIT) tablet Take 1,000 Units by mouth daily.   fexofenadine (ALLEGRA) 180 MG tablet Take 180 mg by mouth daily.   mometasone (NASONEX) 50 MCG/ACT nasal spray PLACE 2 SPRAYS IN EACH NOSTRIL ONCE DAILY AS NEEDED.   Multiple Vitamins-Minerals (MULTIVITAMIN WITH MINERALS) tablet Take 1 tablet by mouth  daily.   omega-3 acid ethyl esters (LOVAZA) 1 g capsule Take by mouth 2 (two) times daily.   pantoprazole (PROTONIX) 40 MG tablet TAKE 1 TABLET BY MOUTH DAILY 30  TO 60 MINUTES BEFORE BREAKFAST   Peppermint Oil (IBGARD) 90 MG CPCR Take 1 capsule by mouth 2 (two) times daily as needed. Take as directed as needed   TRELEGY ELLIPTA 200-62.5-25 MCG/ACT AEPB USE 1 INHALATION BY MOUTH ONCE  DAILY   [DISCONTINUED] ezetimibe (ZETIA) 10 MG tablet Take 1 tablet (10 mg total) by mouth daily.     Allergies:   Hydrocodone, Lipitor [atorvastatin], Penicillins, and Levsin [hyoscyamine]   Social History   Socioeconomic History   Marital status: Divorced    Spouse name: Not on file   Number of children: 2   Years of education: Not on file   Highest education level: Not on file  Occupational History   Occupation: retired  Tobacco Use   Smoking status: Former    Packs/day: 0.33    Years: 43.00    Total pack years: 14.19    Types: Cigarettes    Quit date: 01/06/2013    Years since quitting: 9.2   Smokeless tobacco: Never  Vaping Use   Vaping Use: Never used  Substance and Sexual Activity   Alcohol use: No    Alcohol/week: 0.0 standard drinks of alcohol   Drug use: Not Currently    Comment: x 26 yrs.   Sexual activity: Not on file    Comment: S/P PARTIAL HYSTERECTOMY  Other Topics Concern   Not on file  Social History Narrative   Not on file   Social Determinants of Health   Financial Resource Strain: Not on file  Food Insecurity: Not on file  Transportation Needs: Not on file  Physical Activity: Not on file  Stress: Not on file  Social Connections: Not on file     Family History: The patient's family history includes Aneurysm in her brother; Arthritis in her brother, mother, and sister; Breast cancer in her sister and sister; Diabetes in her father, mother, and sister; Heart disease in her father and mother; Hyperlipidemia in her father; Hypertension in her father, maternal aunt,  maternal uncle, mother, paternal aunt, paternal uncle, sister, sister, and sister; Kidney disease in her father, paternal aunt, and paternal uncle; Mental illness in her brother; Stomach cancer in her maternal uncle; Stroke in her father. There is no history of Colon cancer, Esophageal cancer, or Pancreatic cancer.  ROS:   Please see the history of present illness.    Review of Systems  Constitutional:  Negative for chills, fever and malaise/fatigue.  HENT:  Negative for hearing loss.   Eyes:  Negative for blurred vision and redness.  Respiratory:  Positive for shortness of  breath.   Cardiovascular:  Negative for chest pain, palpitations, orthopnea, claudication, leg swelling and PND.  Gastrointestinal:  Positive for constipation. Negative for melena, nausea and vomiting.  Genitourinary:  Negative for dysuria and flank pain.  Musculoskeletal:  Negative for falls and neck pain.  Neurological:  Negative for dizziness and loss of consciousness.  Endo/Heme/Allergies:  Negative for polydipsia.  Psychiatric/Behavioral:  Negative for substance abuse.     EKGs/Labs/Other Studies Reviewed:    The following studies were reviewed today:   TTE 12/2020: IMPRESSIONS   1. Left ventricular ejection fraction, by estimation, is 60 to 65%. The  left ventricle has normal function. The left ventricle has no regional  wall motion abnormalities. Left ventricular diastolic parameters were  normal.   2. Right ventricular systolic function is normal. The right ventricular  size is normal. There is normal pulmonary artery systolic pressure.   3. The mitral valve is normal in structure. Trivial mitral valve  regurgitation. No evidence of mitral stenosis.   4. The aortic valve is normal in structure. Aortic valve regurgitation is  not visualized. No aortic stenosis is present.   5. The inferior vena cava is normal in size with greater than 50%  respiratory variability, suggesting right atrial pressure of 3  mmHg.   Coronary CTA 12/07/20: FINDINGS: A 100 kV prospective scan was triggered in the descending thoracic aorta at 111 HU's. Axial non-contrast 3 mm slices were carried out through the heart. The data set was analyzed on a dedicated work station and scored using the Section. Gantry rotation speed was 250 msecs and collimation was .6 mm. 25 mg of PO Metoprolol and 0.8 mg of sl NTG were given. The 3D data set was reconstructed in 5% intervals of the 67-82 % of the R-R cycle. Diastolic phases were analyzed on a dedicated work station using MPR, MIP and VRT modes. The patient received 80 cc of contrast.   Aorta: Normal size. Mild diffuse atherosclerotic plaque. No dissection.   Aortic Valve: Trileaflet.  Trivial calcifications.   Coronary Arteries:  Normal coronary origin.  Right dominance.   RCA is a very large dominant artery that gives rise to PDA and PLA. There is mild calcified atherosclerotic plaque in the proximal RCA with stenosis <25%, mid and distal RVA have minimal stenoses.   PDA and PLA have no significant plaque.   Left main is a large artery that gives rise to LAD and LCX arteries. Left main has no plaque.   LAD is a large vessel that gives rise to one large diagonal artery and has no plaque.   D1 has no plaque.   LCX is a non-dominant artery that gives rise to one large OM1 branch. There is no plaque.   Other findings:   Normal pulmonary vein drainage into the left atrium.   Normal left atrial appendage without a thrombus.   Normal size of the pulmonary artery.   IMPRESSION: 1. Coronary calcium score of 90. This was 13 percentile for age and sex matched control.   2. Normal coronary origin with right dominance.   3. CAD-RADS 1. Minimal non-obstructive CAD (0-24%). Consider non-atherosclerotic causes of chest pain. Consider preventive therapy and risk factor modification.  EKG:    03/27/2021:  The ekg ordered today demonstrates NSR with  incomplete RBBB, HR 75  Recent Labs: 09/30/2021: ALT 17; Hemoglobin 13.4; Platelets 316 12/18/2021: BUN 7; Creatinine, Ser 0.83; Potassium 4.1; Sodium 139; TSH 1.630  Recent Lipid Panel    Component Value  Date/Time   CHOL 216 (H) 10/10/2021 0920   TRIG 83 10/10/2021 0920   HDL 94 10/10/2021 0920   CHOLHDL 2.3 10/10/2021 0920   CHOLHDL 2.2 04/14/2015 1642   VLDL 52 (H) 04/14/2015 1642   LDLCALC 108 (H) 10/10/2021 0920     Risk Assessment/Calculations:       Physical Exam:    VS:  BP 122/70   Pulse 75   Ht '5\' 3"'$  (1.6 m)   Wt 139 lb 9.6 oz (63.3 kg)   SpO2 99%   BMI 24.73 kg/m     Wt Readings from Last 3 Encounters:  03/23/22 139 lb 9.6 oz (63.3 kg)  03/21/22 143 lb (64.9 kg)  12/18/21 141 lb 9.6 oz (64.2 kg)     GEN:  Comfortable, NAD HEENT: Normal NECK: No JVD; No carotid bruits CARDIAC: RRR, no murmurs, rubs, gallops RESPIRATORY:  Clear to auscultation without rales, wheezing or rhonchi  ABDOMEN: Soft, non-tender, non-distended MUSCULOSKELETAL:  No edema; No deformity  SKIN: Warm and dry NEUROLOGIC:  Alert and oriented x 3 PSYCHIATRIC:  Normal affect   ASSESSMENT:    1. Coronary artery disease involving native coronary artery of native heart without angina pectoris   2. Medication management   3. Pure hypercholesterolemia   4. Hyperlipidemia, unspecified hyperlipidemia type   5. Dyspnea on exertion   6. Aortic atherosclerosis (HCC)    PLAN:    In order of problems listed above:  #Exertional Chest Pressure #DOE: Reassuring cardiac work-up with only mild RCA disease on coronary CTA with Ca score 90 (83% age, sex matched controls). TTE 12/2020 with normal BiV function with no valve disease. Likely related to underlying asthma. -Management of asthma per primary -Reassuring cardiac work-up  #Mild Nonobstructive CAD: #HLD: #Aortic Atherosclerosis #Statin Intolerance: Ca score 90 (83%) with mild RCA disease. Unable to tolerate crestor, lipitor and  simvastatin. Goal LDL <70. Declined PCSK9i as does not want to do injections. -Continue zetia '10mg'$  daily -Discussed lifestyle modifications as detailed below -Repeat lipids at 6 month follow-up for monitoring  #Asthma: -Follow-up with PCP as scheduled  Exercise recommendations: Goal of exercising for at least 30 minutes a day, at least 5 times per week.  Please exercise to a moderate exertion.  This means that while exercising it is difficult to speak in full sentences, however you are not so short of breath that you feel you must stop, and not so comfortable that you can carry on a full conversation.  Exertion level should be approximately a 5/10, if 10 is the most exertion you can perform.  Diet recommendations: Recommend a heart healthy diet such as the Mediterranean diet.  This diet consists of plant based foods, healthy fats, lean meats, olive oil.  It suggests limiting the intake of simple carbohydrates such as white breads, pastries, and pastas.  It also limits the amount of red meat, wine, and dairy products such as cheese that one should consume on a daily basis.   Follow-up in 6 months  Medication Adjustments/Labs and Tests Ordered: Current medicines are reviewed at length with the patient today.  Concerns regarding medicines are outlined above.  Orders Placed This Encounter  Procedures   Lipid panel   EKG 12-Lead   Meds ordered this encounter  Medications   ezetimibe (ZETIA) 10 MG tablet    Sig: Take 1 tablet (10 mg total) by mouth daily.    Dispense:  90 tablet    Refill:  3    Patient Instructions  Medication Instructions:  The current medical regimen is effective;  continue present plan and medications.  *If you need a refill on your cardiac medications before your next appointment, please call your pharmacy*  Lab Work: Please have fasting blood work in 6 months (Lipid panel)  If you have labs (blood work) drawn today and your tests are completely normal, you  will receive your results only by: [facility] (if you have MyChart) OR A paper copy in the mail If you have any lab test that is abnormal or we need to change your treatment, we will call you to review the results.  Follow-Up: At [facility], you and your health needs are our priority.  As part of our continuing mission to provide you with exceptional heart care, we have created designated Provider Care Teams.  These Care Teams include your primary Cardiologist (physician) and Advanced Practice Providers (APPs -  Physician Assistants and Nurse Practitioners) who all work together to provide you with the care you need, when you need it.  We recommend signing up for the patient portal called "MyChart".  Sign up information is provided on this After Visit Summary.  MyChart is used to connect with patients for Virtual Visits (Telemedicine).  Patients are able to view lab/test results, encounter notes, upcoming appointments, etc.  Non-urgent messages can be sent to your provider as well.   To learn more about what you can do with MyChart, go to [email or web address].    Your next appointment:   6 month(s)  The format for your next appointment:   In Person  Provider:   Freada Bergeron, MD {   Important Information About Sugar          I,Zite Okoli,acting as a scribe for Freada Bergeron, MD.,have documented all relevant documentation on the behalf of Freada Bergeron, MD,as directed by  Freada Bergeron, MD while in the presence of Freada Bergeron, MD.   I, Freada Bergeron, MD, have reviewed all documentation for this visit. The documentation on 03/25/22 for the exam, diagnosis, procedures, and orders are all accurate and complete.   Signed, Freada Bergeron, MD  03/25/2022 8:42 PM    [place]

## 2022-03-23 ENCOUNTER — Ambulatory Visit (INDEPENDENT_AMBULATORY_CARE_PROVIDER_SITE_OTHER): Payer: Medicare Other | Admitting: Cardiology

## 2022-03-23 ENCOUNTER — Encounter: Payer: Self-pay | Admitting: Cardiology

## 2022-03-23 VITALS — BP 122/70 | HR 75 | Ht 63.0 in | Wt 139.6 lb

## 2022-03-23 DIAGNOSIS — I700 Atherosclerosis of aorta: Secondary | ICD-10-CM | POA: Diagnosis not present

## 2022-03-23 DIAGNOSIS — Z79899 Other long term (current) drug therapy: Secondary | ICD-10-CM

## 2022-03-23 DIAGNOSIS — E785 Hyperlipidemia, unspecified: Secondary | ICD-10-CM

## 2022-03-23 DIAGNOSIS — R0609 Other forms of dyspnea: Secondary | ICD-10-CM

## 2022-03-23 DIAGNOSIS — I2510 Atherosclerotic heart disease of native coronary artery without angina pectoris: Secondary | ICD-10-CM | POA: Diagnosis not present

## 2022-03-23 DIAGNOSIS — E7800 Pure hypercholesterolemia, unspecified: Secondary | ICD-10-CM | POA: Diagnosis not present

## 2022-03-23 MED ORDER — EZETIMIBE 10 MG PO TABS: 10.0000 mg | ORAL_TABLET | Freq: Every day | ORAL | 3 refills | Status: DC

## 2022-03-23 NOTE — Patient Instructions (Signed)
Medication Instructions:  The current medical regimen is effective;  continue present plan and medications.  *If you need a refill on your cardiac medications before your next appointment, please call your pharmacy*  Lab Work: Please have fasting blood work in 6 months (Lipid panel)  If you have labs (blood work) drawn today and your tests are completely normal, you will receive your results only by: [place] (if you have MyChart) OR A paper copy in the mail If you have any lab test that is abnormal or we need to change your treatment, we will call you to review the results.  Follow-Up: At [facility], you and your health needs are our priority.  As part of our continuing mission to provide you with exceptional heart care, we have created designated Provider Care Teams.  These Care Teams include your primary Cardiologist (physician) and Advanced Practice Providers (APPs -  Physician Assistants and Nurse Practitioners) who all work together to provide you with the care you need, when you need it.  We recommend signing up for the patient portal called "MyChart".  Sign up information is provided on this After Visit Summary.  MyChart is used to connect with patients for Virtual Visits (Telemedicine).  Patients are able to view lab/test results, encounter notes, upcoming appointments, etc.  Non-urgent messages can be sent to your provider as well.   To learn more about what you can do with MyChart, go to [email or web address].    Your next appointment:   6 month(s)  The format for your next appointment:   In Person  Provider:   Freada Bergeron, MD {   Important Information About Sugar

## 2022-03-29 NOTE — Progress Notes (Signed)
Internal Medicine Clinic Attending  Case discussed with the resident at the time of the visit.  We reviewed the resident's history and exam and pertinent patient test results.  I agree with the assessment, diagnosis, and plan of care documented in the resident's note.  

## 2022-05-08 ENCOUNTER — Telehealth: Payer: Self-pay | Admitting: Gastroenterology

## 2022-05-08 NOTE — Telephone Encounter (Signed)
I would try fleet enemas if she has not already and then could do a Miralax bowel prep to purge. If she is having severe pain, vomiting, concerning for impaction / obstruction she may need to go to the ED. I would start with fleet enemas or mineral oil enemas first to clear out from below and then miralax prep if she as output from the enemas can tolerate it. Can you check on her in the AM? thanks

## 2022-05-08 NOTE — Telephone Encounter (Signed)
Dr. Havery Moros, please see note below. Pt has not been seen since 08/2021. Would you like to see patient in the office or are you able to provide recommendations at this time? Thanks

## 2022-05-08 NOTE — Telephone Encounter (Signed)
Patient called stating she was having severe constipation and nothing she has tried has helped her.  She said she's tried Miralax, castor oil, stool softeners, and a variety of other things and nothing is working for her.  She said she is miserable and is asking for help.  Please call and advise.  Thank you.

## 2022-05-09 NOTE — Telephone Encounter (Signed)
Returned call to patient. I spoke with patient regarding Dr. Doyne Keel recommendations as outlined below. Pt states that she has a fleet enema at home and will get Miralax and Gatorade. Pt will add 7 capfuls of Miralax in a 32 oz bottle of Gatorade, she will drink 1 cup of the solution every 15-20 minutes until its all gone. Pt is aware that she should complete enema first and then proceed with Miralax purge. Pt knows that if she experiences severe pain, vomiting, or any symptoms concerning for impaction she will need to go to the ED for evaluation. Pt knows that I will call and follow up with her tomorrow. Pt verbalized understanding and had no concerns at the end of the call.

## 2022-05-10 NOTE — Telephone Encounter (Signed)
Called and spoke with patient. I provided her with Dr. Doyne Keel recommendations as outlined below. Pt verbalized understanding and had no concerns at the end of the call.

## 2022-05-10 NOTE — Telephone Encounter (Signed)
Pt called into the office today. She states that she completed the enema and Miralax purge last night. She was having diarrhea last night and still today. Pt reports that she has had 3 large volume bowel movements today. Pt states that she is getting weak from the diarrhea. She reports that her stools are tan and liquid. Pt is trying to stay hydrated with water and Gatorade. Pt has not had any solid foods today, she wants to know what she can eat. I told pt that she should try bland foods. Pt is concerned since she is still having diarrhea. Any other recommendations?

## 2022-05-10 NOTE — Telephone Encounter (Signed)
Okay. Well glad the constipation is fixed. The diarrhea should taper off soon. She can be on bland foods but most important is staying hydrated. Push fluids, can use pedialyte, gatorade, etc. Thanks

## 2022-06-29 ENCOUNTER — Telehealth: Payer: Self-pay | Admitting: Gastroenterology

## 2022-06-29 NOTE — Telephone Encounter (Signed)
Returned call to patient. I explained to patient that Dr. Havery Moros is booked for the remainder of the year. I scheduled patient for a follow up appt with Alonza Bogus, PA-C on Wednesday, 08/01/22 at 11 am. Pt wanted recommendations in the interim, she reports diarrhea but has a hx of IBS-C. I told pt it is hard to provide recommendations since she has not been seen since 08/2021 and I can't confirm wether or not she is truly constipated and having overflow diarrhea or if she is just having diarrhea. I asked pt if the Miralax bowel purge that she did before helped at all, pt states that she thinks its too strong for her. She still wanted to know what she could do in the interim, every time I made a suggestion she could not do it. She tried an enema in the past and did not respond well to that. I told pt that Janett Billow will provide recommendations at her office visit. Pt states that she may need to find somewhere else to go since she is still having the same problems. Pt states that the only time she felt good was after an injection in [facility]. I explained again that she has not been seen in almost a year and things change over time, we can't blindly treat her.  I told pt to call back to cancel her appt if she decides to transfer care. Pt had no other concerns at the end of the call.

## 2022-06-29 NOTE — Telephone Encounter (Signed)
Patient called to make an appt but advised her Dr. Havery Moros doesn't have any availability, offered her APP appt, declined as "she cant wait that long" please advise.

## 2022-07-01 ENCOUNTER — Telehealth: Payer: Self-pay | Admitting: Student

## 2022-07-01 NOTE — Telephone Encounter (Signed)
Friday not eating much headaches diarrhea. 2 BM. 3 BM. 2 small BM with mucus.   Abd pain sometimes in top part of stomach sometimes down.   Pain will get a little better. When she is moving around and moving.   Heating pad also helps. IB guard No partiular thingn that makes it worse.   Sometimes eating makes it worse sometimes it doesn't   No throwing up. Real nausous.   Able to urinate okay. Clear looking.   Everything ate went straight.   No sick contacts.   Chills no fever.   Been able to push fluids   Bad abdominal pain. Able to drink water and gatorade.   Called Gi doctor Friday could only be seen Nov 15th.   [facility] for IBS.   Episodes liike this in the past.   COVID negative.

## 2022-07-02 ENCOUNTER — Ambulatory Visit (INDEPENDENT_AMBULATORY_CARE_PROVIDER_SITE_OTHER): Payer: Medicare Other

## 2022-07-02 ENCOUNTER — Telehealth: Payer: Self-pay | Admitting: *Deleted

## 2022-07-02 VITALS — BP 116/65 | HR 80 | Temp 97.8°F | Ht 63.5 in | Wt 142.7 lb

## 2022-07-02 DIAGNOSIS — R1013 Epigastric pain: Secondary | ICD-10-CM | POA: Diagnosis not present

## 2022-07-02 MED ORDER — LUBIPROSTONE 8 MCG PO CAPS: 8.0000 ug | ORAL_CAPSULE | Freq: Two times a day (BID) | ORAL | 1 refills | Status: DC

## 2022-07-02 MED ORDER — PANTOPRAZOLE SODIUM 40 MG PO TBEC: DELAYED_RELEASE_TABLET | ORAL | 2 refills | Status: DC

## 2022-07-02 MED ORDER — PANTOPRAZOLE SODIUM 40 MG PO TBEC
DELAYED_RELEASE_TABLET | ORAL | 2 refills | Status: DC
Start: 2022-07-02 — End: 2022-07-02

## 2022-07-02 NOTE — Telephone Encounter (Signed)
Call from patient c/o abdominal cramping.  Patient took medication Duolax for constipation.  Has had Diarrhea x 2 days.  Weak with HA and unable to get out of bed to care for herself.  Able to drink water.  Diarrhea has stopped.  Unable to eat.  Given appointment for 1:15 this afternoon.  Patient was advised to get someone to drive her or go to the ER by ambulance.  Patient stated she would try to find someone to bring her and will come at 1:15 PM.

## 2022-07-02 NOTE — Patient Instructions (Addendum)
Thank you for coming to see Aimee Davis in clinic Ms. Crimi.   Plan: - We will draw your blood to check your kidney function, liver function, gallbladder function, and pancreas function (we will call you with these results)  - We will draw your blood to check your red and white blood cell levels (we will call you with these results)  - Please take pantoprazole 40 mg daily for the sensation of heartburn  - Please take a break on IBgard until your sensation of heartburn gets better  - Please take Lubiprostone 8 mcg twice daily for your irritable bowel syndrome  - Please try over the counter tylenol for your pain  - Please increase your fluid intake (water, gatorade, pedialyte)    It was very nice to meet you.

## 2022-07-02 NOTE — Progress Notes (Signed)
CC: abdominal pain  HPI:  [name] is a 66 y.o. female with past medical history of CAD, emphysema, overactive bladder, GERD, IBS-C, colon polyps, and incidental pancreatic mass that presents for abdominal pain.   Patient states that she developed intermittent epigastric abdominal pain 4 days ago. She describes her pain as a muscle spasm sensation that radiates to her back. She states that the pain initially improved after having a bowel movement but has returned and is currently 8/10 in severity.   Patient also developed fever, chills, nausea, bloating, increased flatulence, diarrhea, frontal HA, decreased appetite, and increased fatigue at this time as well. She states that she took dulcolax 5 days ago and is unsure if this causing her diarrhea. She states that her fever and chills have currently resolved. Patient is also complaining of lightheadedness upon standing. Patient lives alone and feels as though she is having difficulty performing her daily tasks due to her symptoms. Patient has tried a heating pad and IBgard 90 MG BID without relief of her symptoms. She states that the IBgard occasionally causes a heartburn sensation. Patient was prescribed Lubiprostone 8 mcg BID previously for IBS-C but states that she has not been taking this medication recently. Patient denies taking any other medications for her symptoms. Patient denies recent antibiotic use.   Patient has history of IBS-C but states that this feels worse than usual. Patient also has a history of GERD but is not taking pantoprazole 40 MG regularly. She states that she took 1X dose this week without relief of her symptoms. She states that her current symptoms feel different than her history of GERD. Patient states that she has the sensation of food getting caught in her throat. Denies regurgitation of food. Patient states that she is scheduled to see GI next month.   Patient states that she may have noticed dark stool 4  days ago but is unsure. She states that this has since resolved. Patient denies vomiting, syncope, or dysuria.   The patient would not like the influenza vaccine or pneumonia vaccine today.   Allergies as of 07/02/2022       Reactions   Hydrocodone Other (See Comments)   Recovering addict X 29 years; no narcotics per patient   Lipitor [atorvastatin] Other (See Comments)   Pt reports causes upset stomach, cramps in legs, poor appetite, and sleep disturbance   Penicillins Other (See Comments)   REACTION: rash   Levsin [hyoscyamine] Anxiety        Medication List        Accurate as of July 02, 2022  9:24 AM. If you have any questions, ask your nurse or doctor.          albuterol 108 (90 Base) MCG/ACT inhaler Commonly known as: VENTOLIN HFA USE 2 INHALATIONS BY MOUTH EVERY 6 HOURS AS NEEDED FOR WHEEZING  OR SHORTNESS OF BREATH   aspirin EC 81 MG tablet Take 1 tablet (81 mg total) by mouth daily. Swallow whole.   B-complex with vitamin C tablet Take 1 tablet by mouth daily.   cholecalciferol 25 MCG (1000 UNIT) tablet Commonly known as: VITAMIN D3 Take 1,000 Units by mouth daily.   DRY EYES OP Apply 1 drop to eye daily as needed (dry eyes).   ezetimibe 10 MG tablet Commonly known as: ZETIA Take 1 tablet (10 mg total) by mouth daily.   fexofenadine 180 MG tablet Commonly known as: ALLEGRA Take 180 mg by mouth daily.   IBgard 90 MG  Cpcr Generic drug: Peppermint Oil Take 1 capsule by mouth 2 (two) times daily as needed. Take as directed as needed   lubiprostone 8 MCG capsule Commonly known as: Amitiza Take 1 capsule (8 mcg total) by mouth 2 (two) times daily with a meal.   Magnesium 400 MG Tabs Take 2 each by mouth daily.   mometasone 50 MCG/ACT nasal spray Commonly known as: Nasonex PLACE 2 SPRAYS IN EACH NOSTRIL ONCE DAILY AS NEEDED.   multivitamin with minerals tablet Take 1 tablet by mouth daily.   omega-3 acid ethyl esters 1 g capsule Commonly  known as: LOVAZA Take by mouth 2 (two) times daily.   OVER THE COUNTER MEDICATION EO MEGA Essential Oil - 2 daily   pantoprazole 40 MG tablet Commonly known as: PROTONIX TAKE 1 TABLET BY MOUTH DAILY 30  TO 60 MINUTES BEFORE BREAKFAST   Trelegy Ellipta 200-62.5-25 MCG/ACT Aepb Generic drug: Fluticasone-Umeclidin-Vilant USE 1 INHALATION BY MOUTH ONCE  DAILY   vitamin C 100 MG tablet Take 50 mg by mouth daily.         Past Medical History:  Diagnosis Date   Abdominal pain 08/16/2008   Qualifier: Diagnosis of  By: Hassell Done FNP, [name]     Abdominal pain, epigastric 09/08/2019   Allergy    seasonal, PCN, antidepressants, bentyl   Aortic atherosclerosis (Early) 02/23/2021   Arthritis    Asthma    Asymptomatic bacteriuria 08/24/2021   UA showed leukocytes but patient does not have dysuria, fevers or hematuria. She also had bacteruria in her last UA. Her symptoms of fatigue, and loss of appetite are non-specific but if persist or she begins having dysuria we can work up further. A UTI would not explain the abdominal pain patient is having.    Carpal tunnel syndrome on both sides    right worse than left 2008   Cataract 06/11/2013   COPD (chronic obstructive pulmonary disease) ([place])    COVID-19 virus infection 06/18/2016   Eczema 05/02/2018   Food poisoning 04/04/2021   GERD (gastroesophageal reflux disease)    Hair loss 07/07/2019   Hyperlipidemia    LDL 108 JULY 2013   IBS (irritable bowel syndrome)    Macular rash 05/20/2019   Multiple nevi 03/01/2015   Pneumonia    Positive TB test    From [facility] 2009 note : Recent F/u at [facility]. CXRAy negative.  Positive TST in 2007 (85m).  Unable to tolerate INH due to hepatotoxicity   Primary insomnia 06/05/2012   Skin macule 04/21/2019   Tinnitus 07/07/2019   URI (upper respiratory infection) 11/01/2010   Qualifier: Diagnosis of  By: MHassell DoneFNP, [name]     Review of Systems:  per HPI.   Physical Exam: Vitals:   07/02/22 1320  BP: 116/65   Pulse: 80  Temp: 97.8 F (36.6 C)  TempSrc: Oral  SpO2: 100%  Weight: 142 lb 11.2 oz (64.7 kg)   Constitutional: appears uncomfortable  Cardiovascular: Regular rate, regular rhythm. No murmurs, rubs, or gallops. Normal radial and PT pulses bilaterally. No LE edema.  Pulmonary: Normal respiratory effort. No wheezes, rales, or rhonchi.   Abdominal: Soft. Non-distended. Normal bowel sounds. Mild tenderness to palpation of the epigastric region. No rebound tenderness or guarding. No hepatomegaly.  Musculoskeletal: Normal range of motion.     Neurological: Alert and oriented to person, place, and time. Non-focal. Skin: warm and dry. Decreased skin turgor.   Assessment & Plan:   See Encounters Tab for problem based charting.  Patient  seen with Dr. Daryll Drown

## 2022-07-02 NOTE — Assessment & Plan Note (Addendum)
Patient states that she developed intermittent epigastric abdominal pain 4 days ago. She describes her pain as a muscle spasm sensation that radiates to her back. She states that the pain initially improved after having a bowel movement but has returned and is currently 8/10 in severity.   Patient also developed fever, chills, nausea, bloating, increased flatulence, diarrhea, frontal HA, decreased appetite, and increased fatigue at this time as well. She states that she took dulcolax 5 days ago and is unsure if this causing her diarrhea. She states that her fever and chills have currently resolved. Patient is also complaining of lightheadedness upon standing. Patient lives alone and feels as though she is having difficulty performing her daily tasks due to her symptoms. Patient has tried a heating pad and IBgard 90 MG BID without relief of her symptoms. She states that the IBgard occasionally causes a heartburn sensation. Patient denies taking any other medications for her symptoms. Patient denies recent antibiotic use.   Patient has history of IBS-C but states that this feels worse than usual. Patient also has a history of GERD but is not taking pantoprazole 40 MG regularly. She states that she took 1X dose this week without relief of her symptoms. She states that her current symptoms feel different than her history of GERD. Patient states that she has the sensation of food getting caught in her throat. Denies regurgitation of food. Patient states that she is scheduled to see GI next month.   Patient states that she may have noticed dark stool 4 days ago but is unsure. She states that this has since resolved. Patient denies vomiting, syncope, or dysuria.  Patient was prescribed Lubiprostone 8 mcg BID previously for IBS-C but states that she has not been taking this medication recently.  Vital signs WNL today. Patient has mild epigastric tenderness on exam. Suspect that her abdominal pain is secondary to  IBS-C flare vs pancreatitis. Will order lipase and CMP to further investigate the cause of her symptoms. Suspect that her dysphagia may be secondary to esophageal webs. Will order CBC to assess for iron-deficiency anemia.    Orthostatic vital signs are positive in clinic today. Suspect that she is dehydrated due to recent decreased appetite. Instructed patient to increase fluid intake at home.   Plan: - Lipase ordered - CBC and CMP ordered - Recommended OTC tylenol for her pain - Recommended taking a break from IBgard due to associated heartburn sensation - Recommended taking pantoprazole 40 mg daily - Recommended taking Lubiprostone 8 mcg BID - Will see patient in clinic in 2 weeks to reassess her symptoms

## 2022-07-02 NOTE — Telephone Encounter (Signed)
Agree with being seen.

## 2022-07-03 LAB — CMP14 + ANION GAP
ALT: 24 IU/L (ref 0–32)
AST: 26 IU/L (ref 0–40)
Albumin/Globulin Ratio: 2.3 — ABNORMAL HIGH (ref 1.2–2.2)
Albumin: 4.3 g/dL (ref 3.9–4.9)
Alkaline Phosphatase: 70 IU/L (ref 44–121)
Anion Gap: 14 mmol/L (ref 10.0–18.0)
BUN/Creatinine Ratio: 9 — ABNORMAL LOW (ref 12–28)
BUN: 8 mg/dL (ref 8–27)
Bilirubin Total: 0.5 mg/dL (ref 0.0–1.2)
CO2: 23 mmol/L (ref 20–29)
Calcium: 10.1 mg/dL (ref 8.7–10.3)
Chloride: 100 mmol/L (ref 96–106)
Creatinine, Ser: 0.88 mg/dL (ref 0.57–1.00)
Globulin, Total: 1.9 g/dL (ref 1.5–4.5)
Glucose: 98 mg/dL (ref 70–99)
Potassium: 4.4 mmol/L (ref 3.5–5.2)
Sodium: 137 mmol/L (ref 134–144)
Total Protein: 6.2 g/dL (ref 6.0–8.5)
eGFR: 72 mL/min/{1.73_m2} (ref >59)

## 2022-07-03 LAB — CBC
Hematocrit: 42.2 % (ref 34.0–46.6)
Hemoglobin: 13.9 g/dL (ref 11.1–15.9)
MCH: 27.1 pg (ref 26.6–33.0)
MCHC: 32.9 g/dL (ref 31.5–35.7)
MCV: 82 fL (ref 79–97)
Platelets: 237 10*3/uL (ref 150–450)
RBC: 5.12 x10E6/uL (ref 3.77–5.28)
RDW: 12.1 % (ref 11.7–15.4)
WBC: 2.8 10*3/uL — ABNORMAL LOW (ref 3.4–10.8)

## 2022-07-03 LAB — LIPASE: Lipase: 19 U/L (ref 14–72)

## 2022-07-03 NOTE — Progress Notes (Signed)
Internal Medicine Clinic Attending  Case discussed with Dr. Mapp  at the time of the visit.  We reviewed the resident's history and exam and pertinent patient test results.  I agree with the assessment, diagnosis, and plan of care documented in the resident's note.  

## 2022-07-03 NOTE — Addendum Note (Signed)
Addended by: Gilles Chiquito B on: 07/03/2022 03:00 PM   Modules accepted: Level of Service

## 2022-07-04 ENCOUNTER — Telehealth: Payer: Self-pay

## 2022-07-04 NOTE — Telephone Encounter (Signed)
Pt called back also wanting to tell the Dr she was feeling better but today she is not and she wants to know if the labs has something to do with that

## 2022-07-04 NOTE — Telephone Encounter (Signed)
Patient calling again regarding results.

## 2022-07-04 NOTE — Telephone Encounter (Signed)
Pt is requesting a  call back .Marland Kitchen She is requesting her results she stated they came through on my chart but she is needing some one to explain

## 2022-07-05 NOTE — Telephone Encounter (Signed)
Pt is returning the call from yesterday I have sent the dr a message to tell him know an Sharma Covert have explain to pt he is with patients in the office and via the message  he did say he would try to contact her again today

## 2022-07-05 NOTE — Progress Notes (Signed)
Called patient and updated her on low WBC that is not surprising given her history of Benign Ethnic Neutropenia.

## 2022-07-05 NOTE — Progress Notes (Signed)
Called patient and updated on normal results.

## 2022-07-05 NOTE — Progress Notes (Signed)
Called patient and updated her on elevated albumin/globulin ratio. Discussed that this may be related to her IBS.

## 2022-07-09 NOTE — Telephone Encounter (Signed)
Pt is calling again .Marland Kitchen She stated that she waiting on the phone from the dr but never got one

## 2022-07-13 DIAGNOSIS — Z789 Other specified health status: Secondary | ICD-10-CM

## 2022-07-13 NOTE — Progress Notes (Signed)
[facility])                                            [place] Team                                        Statin Quality Measure Assessment    07/13/2022  Aimee Davis 06/01/1956 [id number]  Per review of chart and payor information, this patient has been flagged for non-adherence to the following CMS Quality Measure:   '[]'$  Statin Use in Persons with Diabetes(SUPD)  '[x]'$  Statin Use in Persons with Cardiovascular Disease(SPC)  The 10-year ASCVD risk score (Arnett DK, et al., 2019) is: 7.7%   Values used to calculate the score:     Age: 66 years     Sex: Female     Is Non-Hispanic African American: Yes     Diabetic: No     Tobacco smoker: No     Systolic Blood Pressure: 093 mmHg     Is BP treated: No     HDL Cholesterol: 94 mg/dL     Total Cholesterol: 216 mg/dL  This patient is failing SPC CMS measure. Prior documentation of patient reporting statin caused upset stomach, leg cramps, poor appetite, and sleep disturbance. Coronary calcium score of 90. Zetia on file. Next appointment is on 07/16/2022. If deemed clinically appropriate, please consider associating exclusion code (see options below) at the next office visit.   Please consider ONE of the following recommendations:   Initiate high intensity statin Atorvastatin '40mg'$  once daily, #90, 3 refills   Rosuvastatin '20mg'$  once daily, #90, 3 refills    Initiate moderate intensity          statin with reduced frequency if prior          statin intolerance 1x weekly, #13, 3 refills   2x weekly, #26, 3 refills   3x weekly, #39, 3 refills   Code for past statin intolerance or other exclusions (required annually)  Drug Induced Myopathy G72.0   Myositis, unspecified M60.9   Rhabdomyolysis M62.82   Cirrhosis of liver K74.69   Biliary cirrhosis, unspecified K74.5   Abnormal blood glucose - for SUPD ONLY R73.09   Prediabetes - for SUPD ONLY  R73.03   Thank you  for your time,  Kristeen Miss, [place] Cell: [phone]

## 2022-07-16 ENCOUNTER — Ambulatory Visit (INDEPENDENT_AMBULATORY_CARE_PROVIDER_SITE_OTHER): Payer: Medicare Other | Admitting: Student

## 2022-07-16 ENCOUNTER — Other Ambulatory Visit: Payer: Self-pay | Admitting: Student

## 2022-07-16 VITALS — BP 121/67 | HR 76 | Temp 98.4°F | Wt 146.4 lb

## 2022-07-16 DIAGNOSIS — R1084 Generalized abdominal pain: Secondary | ICD-10-CM

## 2022-07-16 DIAGNOSIS — K589 Irritable bowel syndrome without diarrhea: Secondary | ICD-10-CM | POA: Diagnosis not present

## 2022-07-16 DIAGNOSIS — J439 Emphysema, unspecified: Secondary | ICD-10-CM

## 2022-07-16 DIAGNOSIS — G720 Drug-induced myopathy: Secondary | ICD-10-CM | POA: Diagnosis not present

## 2022-07-16 MED ORDER — DICYCLOMINE HCL 10 MG PO CAPS: 10.0000 mg | ORAL_CAPSULE | Freq: Three times a day (TID) | ORAL | 0 refills | Status: DC

## 2022-07-16 MED ORDER — TRELEGY ELLIPTA 200-62.5-25 MCG/ACT IN AEPB: INHALATION_SPRAY | RESPIRATORY_TRACT | 3 refills | Status: DC

## 2022-07-16 NOTE — Assessment & Plan Note (Addendum)
Aimee Davis is returning to clinic for two-week follow-up after being seen on 10/16 for the same issue. At that time, Dr. Alton Revere initiated a work-up and instructed patient to take pantoprazole, IBgard, and lubiprostone. Since that time, patient reports symptoms have been persistent and she has not seen relief with current medications. Describes mainly epigastric pain that occurs after eating. Describes squeezing, sharp, cramp-like pain that can last up to a few hours at a time. Reports mainly soft/liquid diet, including Jello, broth, and applesauce. She has experienced intermittent diarrhea and constipation over the last month since this episode started. Previously was prescribed multiple different stool softeners, including Miralax, dulcolax, mild of magnesia, senna, all of which did not help her symptoms.  Not having any current fevers, chills, body aches. She reports symptoms are severely limiting and it is difficult to do her independent activities of daily living when dealing with this.   Most likely this is a manifestation of irritable bowel syndrome. She does have intermittent constipation and diarrhea, but her current symptoms and history are not consistent with overflow diarrhea. Upon chart review she has not been on Bentyl in >3 years and I do not see where she had any adverse reaction to this. We will re-initiate this again today and have her continue follow-up with GI on 11/15.  - Start Bentyl '10mg'$  four times daily - Follow-up with GI on 11/15

## 2022-07-16 NOTE — Progress Notes (Signed)
CC: abdominal pain  HPI:  [name] is a 66 y.o. person with medical history as below presenting to [facility] for abdominal pain.  Please see problem-based list for further details, assessments, and plans.  Past Medical History:  Diagnosis Date   Abdominal pain 11/30/Davis   Qualifier: Diagnosis of  By: Hassell Done Davis, Aimee Davis     Abdominal pain, epigastric 09/08/2019   Allergy    seasonal, PCN, antidepressants, bentyl   Aortic atherosclerosis (Aimee Davis) 02/23/2021   Arthritis    Asthma    Asymptomatic bacteriuria 08/24/2021   UA showed leukocytes but patient does not have dysuria, fevers or hematuria. She also had bacteruria in her last UA. Her symptoms of fatigue, and loss of appetite are non-specific but if persist or she begins having dysuria we can work up further. A UTI would not explain the abdominal pain patient is having.    Carpal tunnel syndrome on both sides    right worse than left 2008   Cataract 06/11/2013   COPD (chronic obstructive pulmonary disease) (Aimee Davis)    COVID-19 virus infection 06/18/2016   Eczema 05/02/2018   Food poisoning 04/04/2021   GERD (gastroesophageal reflux disease)    Hair loss 07/07/2019   Hyperlipidemia    LDL 108 JULY 2013   IBS (irritable bowel syndrome)    Macular rash 05/20/2019   Multiple nevi 03/01/2015   Pneumonia    Positive TB test    From [facility]imee [facility] note : Recent F/u at [facility]. CXRAy negative.  Positive TST in 2007 (53m).  Unable to tolerate INH due to hepatotoxicity   Primary insomnia 06/05/2012   Skin macule 04/21/2019   Tinnitus 07/07/2019   URI (upper respiratory infection) 11/01/2010   Qualifier: Diagnosis of  By: Aimee Davis, NTori Davis    Review of Systems:  As per HPI  Physical Exam:  Vitals:   07/16/22 1320  BP: 121/67  Pulse: 76  Temp: 98.4 F (36.9 C)  TempSrc: Oral  SpO2: 100%  Weight: 146 lb 6.4 oz (66.4 kg)   General: Resting comfortably, no acute distress CV: Regular rate, rhythm. No murmurs appreciated.  Pulm:  Normal work of breathing. Clear to ausculation bilaterally. Abd: Soft, mild tenderness epigastric region, non-distended. Normoactive bowel sounds. Skin: Warm, dry. No rashes or lesions.  Neuro: Awake, alert, conversing appropriately.  Psych: Normal mood, affect, speech.   Assessment & Plan:   Abdominal pain Ms. MTolleis returning to clinic for two-week follow-up after being seen on 10/16 for the same issue. At that time, Dr. MAlton Revereinitiated a work-up and instructed patient to take pantoprazole, IBgard, and lubiprostone. Since that time, patient reports symptoms have been persistent and she has not seen relief with current medications. Describes mainly epigastric pain that occurs after eating. Describes squeezing, sharp, cramp-like pain that can last up to a few hours at a time. Reports mainly soft/liquid diet, including Jello, broth, and applesauce. She has experienced intermittent diarrhea and constipation over the last month since this episode started. Previously was prescribed multiple different stool softeners, including Miralax, dulcolax, mild of magnesia, senna, all of which did not help her symptoms.  Not having any current fevers, chills, body aches. She reports symptoms are severely limiting and it is difficult to do her independent activities of daily living when dealing with this.   Most likely this is a manifestation of irritable bowel syndrome. She does have intermittent constipation and diarrhea, but her current symptoms and history are not consistent with overflow diarrhea. Upon  chart review she has not been on Bentyl in >3 years and I do not see where she had any adverse reaction to this. We will re-initiate this again today and have her continue follow-up with GI on 11/15.  - Start Bentyl '10mg'$  four times daily - Follow-up with GI on 11/15  Patient discussed with Dr. Lise Auer, MD Internal Medicine PGY-3 Pager: [phone]

## 2022-07-16 NOTE — Patient Instructions (Signed)
[name], it was a pleasure seeing you today!  Today we discussed: - Abdominal pain: I think this is part of your irritable bowel syndrome. I would like to start you on Bentyl to help with the spasms. Please make sure to follow-up with your GI doctor on November 15th.   I have ordered the following medication/changed the following medications:    Start the following medications: Meds ordered this encounter  Medications   dicyclomine (BENTYL) 10 MG capsule    Sig: Take 1 capsule (10 mg total) by mouth 4 (four) times daily -  before meals and at bedtime.    Dispense:  120 capsule    Refill:  0     Follow-up:  if symptoms do not improve    Please make sure to arrive 15 minutes prior to your next appointment. If you arrive late, you may be asked to reschedule.   We look forward to seeing you next time. Please call our clinic at [phone] if you have any questions or concerns. The best time to call is Monday-Friday from 9am-4pm, but there is someone available 24/7. If after hours or the weekend, call the main hospital number and ask for the Internal Medicine Resident On-Call. If you need medication refills, please notify your pharmacy one week in advance and they will send [place] a request.  Thank you for letting us take part in your care. Wishing you the best!  Thank you, Sanjuan Dame, MD

## 2022-07-18 NOTE — Progress Notes (Signed)
Internal Medicine Clinic Attending  Case discussed with Dr. Nguyen  At the time of the visit.  We reviewed the resident's history and exam and pertinent patient test results.  I agree with the assessment, diagnosis, and plan of care documented in the resident's note. 

## 2022-07-31 ENCOUNTER — Other Ambulatory Visit: Payer: Self-pay | Admitting: *Deleted

## 2022-07-31 DIAGNOSIS — Z79899 Other long term (current) drug therapy: Secondary | ICD-10-CM

## 2022-07-31 DIAGNOSIS — I2510 Atherosclerotic heart disease of native coronary artery without angina pectoris: Secondary | ICD-10-CM

## 2022-07-31 DIAGNOSIS — E785 Hyperlipidemia, unspecified: Secondary | ICD-10-CM

## 2022-07-31 DIAGNOSIS — E7800 Pure hypercholesterolemia, unspecified: Secondary | ICD-10-CM

## 2022-07-31 MED ORDER — EZETIMIBE 10 MG PO TABS: 10.0000 mg | ORAL_TABLET | Freq: Every day | ORAL | 2 refills | Status: DC

## 2022-08-01 ENCOUNTER — Encounter: Payer: Self-pay | Admitting: Gastroenterology

## 2022-08-01 ENCOUNTER — Ambulatory Visit (INDEPENDENT_AMBULATORY_CARE_PROVIDER_SITE_OTHER): Payer: Medicare Other | Admitting: Gastroenterology

## 2022-08-01 ENCOUNTER — Other Ambulatory Visit (INDEPENDENT_AMBULATORY_CARE_PROVIDER_SITE_OTHER): Payer: Medicare Other

## 2022-08-01 VITALS — BP 118/52 | HR 70 | Ht 63.0 in | Wt 145.4 lb

## 2022-08-01 DIAGNOSIS — R1013 Epigastric pain: Secondary | ICD-10-CM

## 2022-08-01 DIAGNOSIS — K581 Irritable bowel syndrome with constipation: Secondary | ICD-10-CM | POA: Diagnosis not present

## 2022-08-01 DIAGNOSIS — K869 Disease of pancreas, unspecified: Secondary | ICD-10-CM

## 2022-08-01 LAB — BASIC METABOLIC PANEL
BUN: 19 mg/dL (ref 6–23)
CO2: 31 mEq/L (ref 19–32)
Calcium: 9.6 mg/dL (ref 8.4–10.5)
Chloride: 105 mEq/L (ref 96–112)
Creatinine, Ser: 0.91 mg/dL (ref 0.40–1.20)
GFR: 65.1 mL/min (ref >60.00)
Glucose, Bld: 84 mg/dL (ref 70–99)
Potassium: 4.2 mEq/L (ref 3.5–5.1)
Sodium: 139 mEq/L (ref 135–145)

## 2022-08-01 NOTE — Progress Notes (Unsigned)
08/01/2022 JOSELINE MCCAMPBELL [id number] 1956/05/13   HISTORY OF PRESENT ILLNESS: This is a 66 year old female who is a patient Dr. Doyne Keel.  She has a history of IBS-C.  Was last seen by me in December 2022 for her complaints of constipation and also epigastric abdominal pain.  In regards to her constipation she was previously on Linzess but says that she had problems with it and then tried MiraLAX, but said that it did not help.  She started taking some type of prune lax, which she said helped for a while, but then stopped working.  She is now on a stool softener/laxative like Senokot.  She continues to complain of epigastric abdominal pain.  She is on pantoprazole 40 mg daily.  She had a CT scan performed in July 2022 that showed an indeterminate 3 mm hypodensity over the body of the pancreas for which they were recommending an MRI abdomen/MRCP or pancreatic protocol CT scan follow-up in 2 years.  She tells me that she was given dicyclomine for her abdominal pain recently but it caused her to be very confused.  They also gave her Amitiza 8 mcg twice daily recently for her constipation, but she admits she did not try it regularly.  Says she has about 2-3 bowel movements weekly that alternate between hard stools and normal stools.  She had an episode a few weeks ago where she has had severe diarrhea, but now is back to her baseline.  Colonoscopy 11/2018:   - One diminutive polyp in the cecum, removed with a cold snare. Resected and retrieved. - Tortuous colon. - Internal hemorrhoids. - The examination was otherwise normal.   Tubular adenoma.  Repeat 7 years.   EGD 08/2021:  - Esophagogastric landmarks identified. - Normal esophagus. - Normal stomach. Biopsied to rule out H pylori. - Erythematous duodenum - mild. Biopsied. Otherwise normal duodenum.  1. Surgical [P], small intestine - DUODENAL MUCOSA WITH NORMAL VILLOUS ARCHITECTURE. - NO VILLOUS ATROPHY OR INCREASED  INTRAEPITHELIAL LYMPHOCYTES. 2. Surgical [P], gastric antrum and gastric body - ANTRAL AND OXYNTIC MUCOSA WITH MILD CHANGES OF REACTIVE GASTROPATHY. - NO HELICOBACTER PYLORI IDENTIFIED. Claudette Laws  Past Medical History:  Diagnosis Date   Abdominal pain 08/16/2008   Qualifier: Diagnosis of  By: Hassell Done FNP, [name]     Abdominal pain, epigastric 09/08/2019   Allergy    seasonal, PCN, antidepressants, bentyl   Aortic atherosclerosis ([place]) 02/23/2021   Arthritis    Asthma    Asymptomatic bacteriuria 08/24/2021   UA showed leukocytes but patient does not have dysuria, fevers or hematuria. She also had bacteruria in her last UA. Her symptoms of fatigue, and loss of appetite are non-specific but if persist or she begins having dysuria we can work up further. A UTI would not explain the abdominal pain patient is having.    Carpal tunnel syndrome on both sides    right worse than left 2008   Cataract 06/11/2013   COPD (chronic obstructive pulmonary disease) ([place])    COVID-19 virus infection 06/18/2016   Eczema 05/02/2018   Food poisoning 04/04/2021   GERD (gastroesophageal reflux disease)    Hair loss 07/07/2019   Hyperlipidemia    LDL 108 JULY 2013   IBS (irritable bowel syndrome)    Macular rash 05/20/2019   Multiple nevi 03/01/2015   Pneumonia    Positive TB test    From MArch 2009 note : [facility] F[facility] at [facility]. CXRAy negative.  Positive TST in 2007 (17m).  Unable to tolerate INH due to hepatotoxicity   Primary insomnia 06/05/2012   Skin macule 04/21/2019   Tinnitus 07/07/2019   URI (upper respiratory infection) 11/01/2010   Qualifier: Diagnosis of  By: Hassell Done FNP, Tori Milks     Past Surgical History:  Procedure Laterality Date   BREAST CYST EXCISION Right 2011   BREAST EXCISIONAL BIOPSY     benign   BREAST EXCISIONAL BIOPSY Right 1990   benign   INDUCED ABORTION  2005   VAGINAL HYSTERECTOMY  1983   partial; for abnormal bleeding    reports that she quit smoking about 9 years ago. Her  smoking use included cigarettes. She has a 14.19 pack-year smoking history. She has never used smokeless tobacco. She reports that she does not currently use drugs. She reports that she does not drink alcohol. family history includes Aneurysm in her brother; Arthritis in her brother, mother, and sister; Breast cancer in her sister and sister; Diabetes in her father, mother, and sister; Heart disease in her father and mother; Hyperlipidemia in her father; Hypertension in her father, maternal aunt, maternal uncle, mother, paternal aunt, paternal uncle, sister, sister, and sister; Kidney disease in her father, paternal aunt, and paternal uncle; Mental illness in her brother; Stomach cancer in her maternal uncle; Stroke in her father. Allergies  Allergen Reactions   Dicyclomine     confusion   Hydrocodone Other (See Comments)    Recovering addict X 29 years; no narcotics per patient   Lipitor [Atorvastatin] Other (See Comments)    Pt reports causes upset stomach, cramps in legs, poor appetite, and sleep disturbance   Penicillins Other (See Comments)    REACTION: rash   Levsin [Hyoscyamine] Anxiety      Outpatient Encounter Medications as of 08/01/2022  Medication Sig   albuterol (VENTOLIN HFA) 108 (90 Base) MCG/ACT inhaler USE 2 INHALATIONS BY MOUTH EVERY 6 HOURS AS NEEDED FOR WHEEZING  OR SHORTNESS OF BREATH   Artificial Tear Ointment (DRY EYES OP) Apply 1 drop to eye daily as needed (dry eyes).   Ascorbic Acid (VITAMIN C) 100 MG tablet Take 50 mg by mouth daily.   cholecalciferol (VITAMIN D3) 25 MCG (1000 UNIT) tablet Take 1,000 Units by mouth daily.   fexofenadine (ALLEGRA) 180 MG tablet Take 180 mg by mouth daily.   Fluticasone-Umeclidin-Vilant (TRELEGY ELLIPTA) 200-62.5-25 MCG/ACT AEPB USE 1 INHALATION BY MOUTH ONCE  DAILY   mometasone (NASONEX) 50 MCG/ACT nasal spray PLACE 2 SPRAYS IN EACH NOSTRIL ONCE DAILY AS NEEDED.   Multiple Vitamins-Minerals (MULTIVITAMIN WITH MINERALS) tablet Take 1  tablet by mouth daily.   OVER THE COUNTER MEDICATION EO MEGA Essential Oil - 2 daily   pantoprazole (PROTONIX) 40 MG tablet TAKE 1 TABLET BY MOUTH DAILY 30  TO 60 MINUTES BEFORE BREAKFAST   aspirin EC 81 MG tablet Take 1 tablet (81 mg total) by mouth daily. Swallow whole. (Patient not taking: Reported on 08/01/2022)   B Complex-C (B-COMPLEX WITH VITAMIN C) tablet Take 1 tablet by mouth daily. (Patient not taking: Reported on 08/01/2022)   dicyclomine (BENTYL) 10 MG capsule Take 1 capsule (10 mg total) by mouth 4 (four) times daily -  before meals and at bedtime. (Patient not taking: Reported on 08/01/2022)   ezetimibe (ZETIA) 10 MG tablet Take 1 tablet (10 mg total) by mouth daily. (Patient not taking: Reported on 08/01/2022)   lubiprostone (AMITIZA) 8 MCG capsule Take 1 capsule (8 mcg total) by mouth 2 (two) times daily with a meal. (Patient not  taking: Reported on 08/01/2022)   Magnesium 400 MG TABS Take 2 each by mouth daily. (Patient not taking: Reported on 08/01/2022)   omega-3 acid ethyl esters (LOVAZA) 1 g capsule Take by mouth 2 (two) times daily. (Patient not taking: Reported on 08/01/2022)   Peppermint Oil (IBGARD) 90 MG CPCR Take 1 capsule by mouth 2 (two) times daily as needed. Take as directed as needed (Patient not taking: Reported on 08/01/2022)   No facility-administered encounter medications on file as of 08/01/2022.     REVIEW OF SYSTEMS  : All other systems reviewed and negative except where noted in the History of Present Illness.   PHYSICAL EXAM: BP (!) 118/52   Pulse 70   Ht '5\' 3"'$  (1.6 m)   Wt 145 lb 6.4 oz (66 kg)   BMI 25.76 kg/m  General: Well developed female in no acute distress Head: Normocephalic and atraumatic Eyes:  Sclerae anicteric, conjunctiva pink. Ears: Normal auditory acuity Lungs: Clear throughout to auscultation; no W/R/R. Heart: Regular rate and rhythm; no M/R/G. Abdomen: Soft, non-distended.  BS present.  Some epigastric TTP. Musculoskeletal:  Symmetrical with no gross deformities  Skin: No lesions on visible extremities Extremities: No edema  Neurological: Alert oriented x 4, grossly non-focal Psychological:  Alert and cooperative. Normal mood and affect  ASSESSMENT AND PLAN: *IBS with constipation: Previously on Linzess, but says that she had problems with that.  Says that MiraLAX did not help.  She was taking prune lax, which she said helped for a while but then stopped working.  Was recently given Amitiza 8 mcg twice daily with food, but she admits that she did not take it regularly.  She is willing to try this again regularly.  Recent diarrhea a few weeks ago suspected to be some type of gastroenteritis as that has now resolved and she is back to her baseline with bowel movements. *Epigastric abdominal pain: Seen by me a year ago for the same pain for which she underwent EGD with minimal findings.  Continues to complain of epigastric abdominal pain despite pantoprazole 40 mg daily. *Pancreatic lesion: CT scan and July 2022 showed an indeterminate 3 mm hypodensity over the body of the pancreas which they recommended MRI/MRCP or pancreatic protocol CT scan follow-up in 2 years.  With her ongoing complaint pain in the epigastrium and her concern about this we will go ahead and repeat a CT scan of her abdomen with pancreatic protocol at this time.   CC:  Lacinda Axon, MD

## 2022-08-01 NOTE — Patient Instructions (Signed)
Your provider has requested that you go to the basement level for lab work before leaving today. Press "B" on the elevator. The lab is located at the first door on the left as you exit the elevator.  You will be contacted by [place] in the next 7 days to arrange a Ct abdomen/pancreatic protocol.  The number on your caller ID will be [phone], please answer when they call.  If you have not heard from them in 7 days please call [phone] to schedule.    Start back on Amitiza 8 mcg twice daily with food. Mychart message our office in 2 weeks with an update.  Due to recent changes in healthcare laws, you may see the results of your imaging and laboratory studies on MyChart before your provider has had a chance to review them.  We understand that in some cases there may be results that are confusing or concerning to you. Not all laboratory results come back in the same time frame and the provider may be waiting for multiple results in order to interpret others.  Please give [place] 48 hours in order for your provider to thoroughly review all the results before contacting the office for clarification of your results.   The [facility] providers would like to encourage you to use [facility] to communicate with providers for non-urgent requests or questions.  Due to long hold times on the telephone, sending your provider a message by [facility] may be a faster and more efficient way to get a response.  Please allow 48 business hours for a response.  Please remember that this is for non-urgent requests.

## 2022-08-02 ENCOUNTER — Encounter: Payer: Self-pay | Admitting: Gastroenterology

## 2022-08-02 DIAGNOSIS — K869 Disease of pancreas, unspecified: Secondary | ICD-10-CM | POA: Insufficient documentation

## 2022-08-02 NOTE — Progress Notes (Signed)
Agree with assessment and plan as outlined.  

## 2022-08-06 IMAGING — CT CT HEART MORP W/ CTA COR W/ SCORE W/ CA W/CM &/OR W/O CM
1 of 9 series · 10 of 20 positions shown, 13 images · IV contrast (APPLIED)
Comparison: None.
COMPARISON: None.

Addendum:
EXAM:
OVER-READ INTERPRETATION  CT CHEST

The following report is an over-read performed by radiologist Dr.
Habtamu Alvino [REDACTED] on 12/07/2020. This
over-read does not include interpretation of cardiac or coronary
anatomy or pathology. The coronary calcium score/coronary CTA
interpretation by the cardiologist is attached.
CLINICAL DATA: 64-year-old female with family history of early CAD
and chest pain.
Cardiac/Coronary  CTA
TECHNIQUE: The patient was scanned on a Siemens Somatom Edge Plus scanner.

[Series 10: multiphase 35 - 75 % · axial · 0.39mm/px · z∈[+929,+1042]mm · 10 of 3096 slices shown, 13 images]
[im 282/3096  vessel]
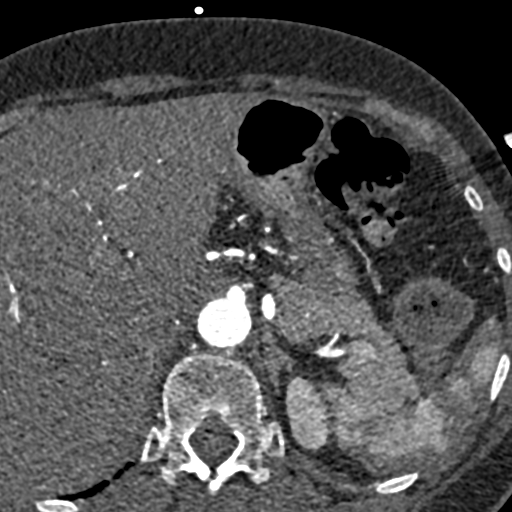
[im 282/3096  lung]
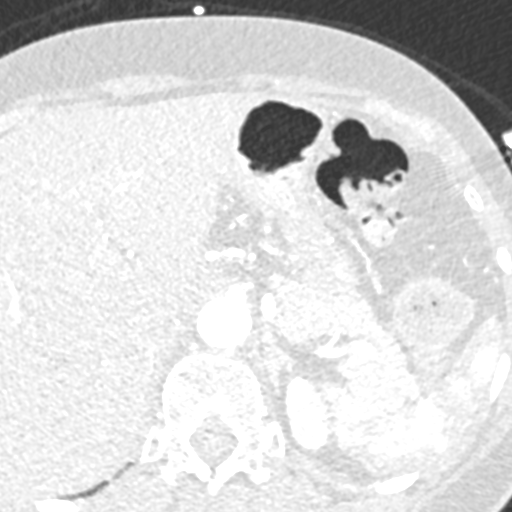
[im 563/3096  vessel]
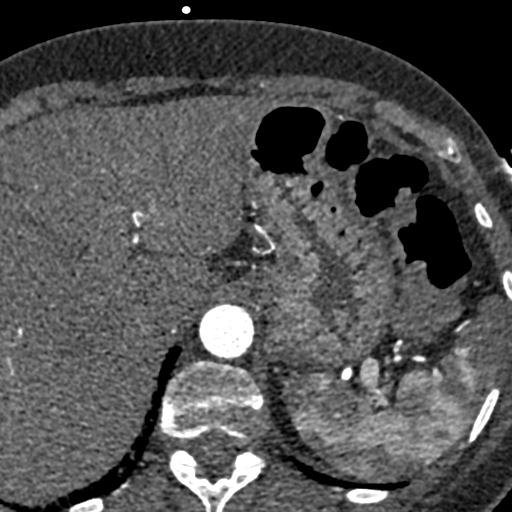
[im 845/3096  vessel]
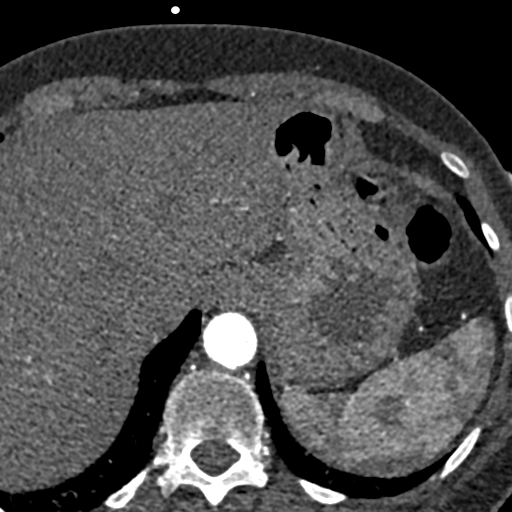
[im 1126/3096  vessel]
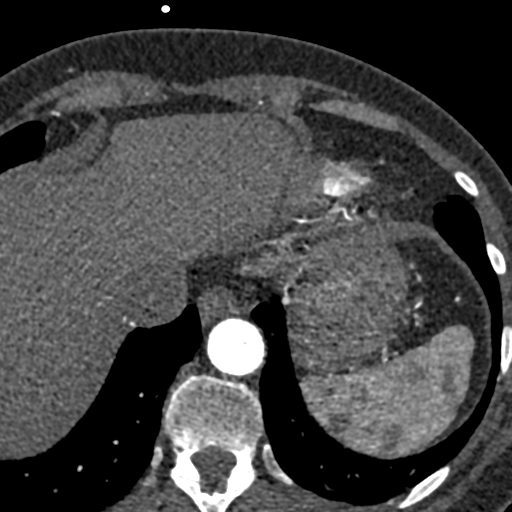
[im 1407/3096  vessel]
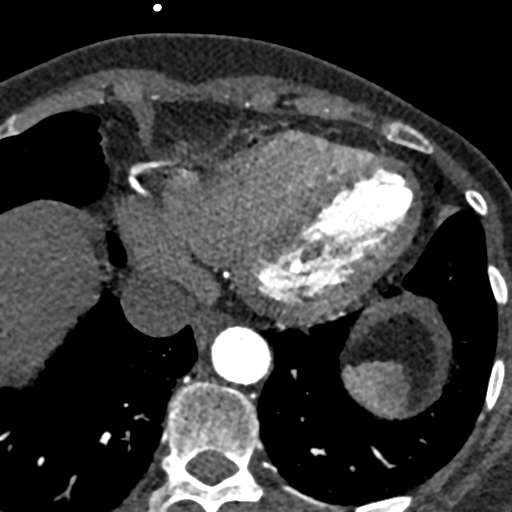
[im 1407/3096  lung]
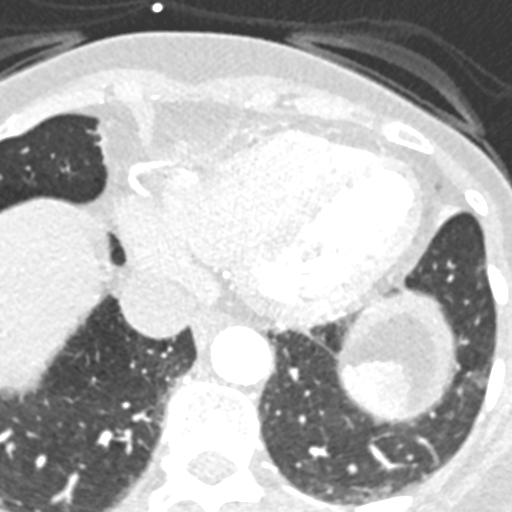
[im 1689/3096  vessel]
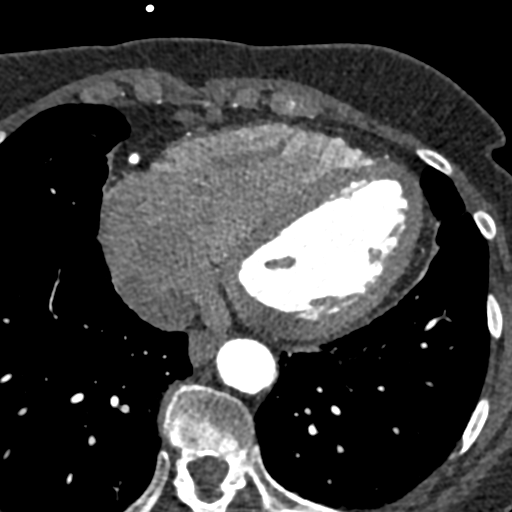
[im 1970/3096  vessel]
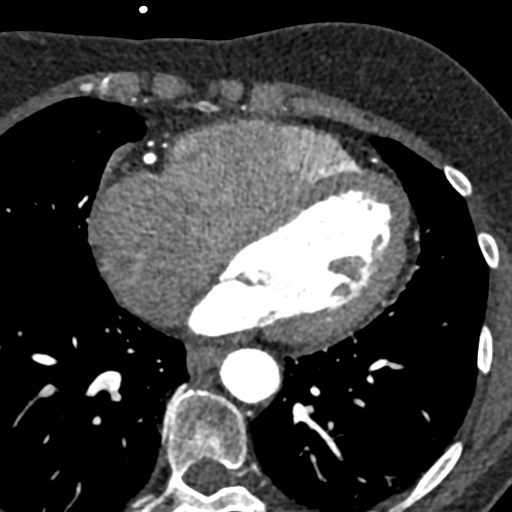
[im 2251/3096  vessel]
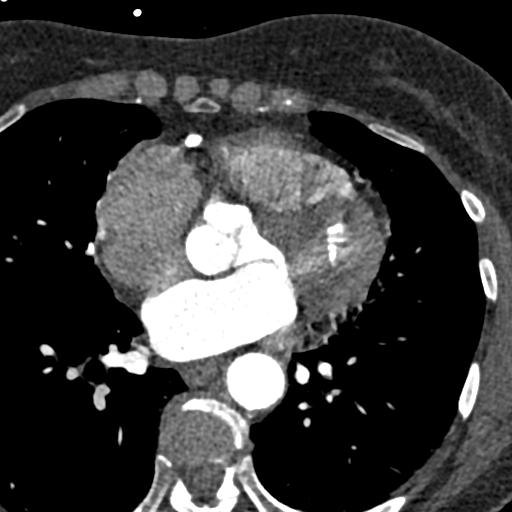
[im 2533/3096  vessel]
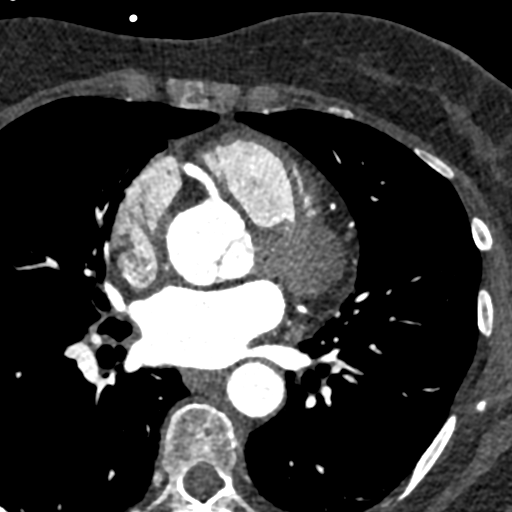
[im 2533/3096  lung]
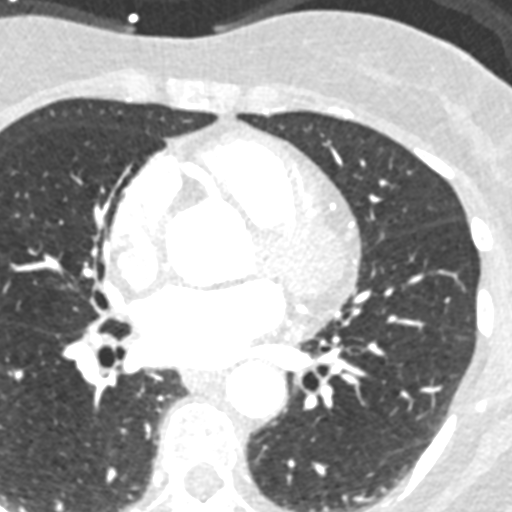
[im 2814/3096  vessel]
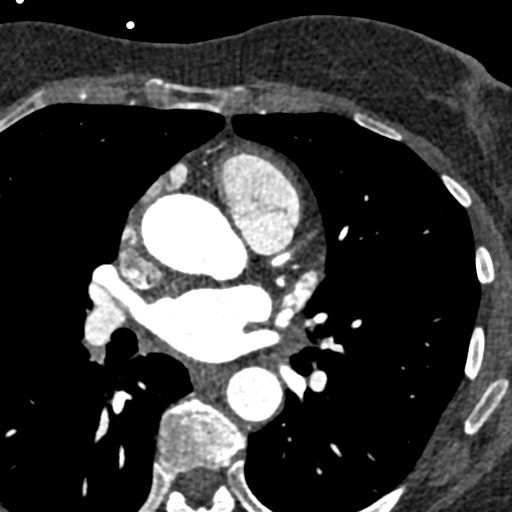

[10 of 20 positions shown; findings below may reference images not displayed]

FINDINGS: Atherosclerotic calcifications in the thoracic aorta. Within the
visualized portions of the thorax there are no suspicious appearing
pulmonary nodules or masses, there is no acute consolidative
airspace disease, no pleural effusions, no pneumothorax and no
lymphadenopathy. Visualized portions of the upper abdomen are
unremarkable. There are no aggressive appearing lytic or blastic
lesions noted in the visualized portions of the skeleton.
IMPRESSION: 1.  Aortic Atherosclerosis ([facility]).
FINDINGS: A 100 kV prospective scan was triggered in the descending thoracic
aorta at 111 HU's. Axial non-contrast 3 mm slices were carried out
through the heart. The data set was analyzed on a dedicated work
station and scored using the Agatson method. Gantry rotation speed
was 250 msecs and collimation was .6 mm. 25 mg of PO Metoprolol and
0.8 mg of sl NTG were given. The 3D data set was reconstructed in 5%
intervals of the 67-82 % of the R-R cycle. Diastolic phases were
analyzed on a dedicated work station using MPR, MIP and VRT modes.
The patient received 80 cc of contrast.

Aorta: Normal size. Mild diffuse atherosclerotic plaque. No
dissection.

Aortic Valve: Trileaflet.  Trivial calcifications.

Coronary Arteries:  Normal coronary origin.  Right dominance.

RCA is a very large dominant artery that gives rise to PDA and PLA.
There is mild calcified atherosclerotic plaque in the proximal RCA
with stenosis <25%, mid and distal RVA have minimal stenoses.

PDA and PLA have no significant plaque.

Left main is a large artery that gives rise to LAD and LCX arteries.
Left main has no plaque.

LAD is a large vessel that gives rise to one large diagonal artery
and has no plaque.

D1 has no plaque.

LCX is a non-dominant artery that gives rise to one large OM1
branch. There is no plaque.

Other findings:

Normal pulmonary vein drainage into the left atrium.

Normal left atrial appendage without a thrombus.

Normal size of the pulmonary artery.
IMPRESSION: 1. Coronary calcium score of 90. This was 83 percentile for age and
sex matched control.

2. Normal coronary origin with right dominance.

3. CAD-RADS 1. Minimal non-obstructive CAD (0-24%). Consider
non-atherosclerotic causes of chest pain. Consider preventive
therapy and risk factor modification.

*** End of Addendum ***
EXAM:
OVER-READ INTERPRETATION  CT CHEST

The following report is an over-read performed by radiologist Dr.
Habtamu Alvino [REDACTED] on 12/07/2020. This
over-read does not include interpretation of cardiac or coronary
anatomy or pathology. The coronary calcium score/coronary CTA
interpretation by the cardiologist is attached.
FINDINGS: Atherosclerotic calcifications in the thoracic aorta. Within the
visualized portions of the thorax there are no suspicious appearing
pulmonary nodules or masses, there is no acute consolidative
airspace disease, no pleural effusions, no pneumothorax and no
lymphadenopathy. Visualized portions of the upper abdomen are
unremarkable. There are no aggressive appearing lytic or blastic
lesions noted in the visualized portions of the skeleton.
IMPRESSION: 1.  Aortic Atherosclerosis ([facility]).

## 2022-08-10 ENCOUNTER — Encounter (HOSPITAL_COMMUNITY): Payer: Self-pay

## 2022-08-10 ENCOUNTER — Ambulatory Visit (HOSPITAL_COMMUNITY): Admission: RE | Admit: 2022-08-10 | Payer: Medicare Other | Source: Ambulatory Visit

## 2022-09-06 ENCOUNTER — Telehealth: Payer: Self-pay | Admitting: Gastroenterology

## 2022-09-06 NOTE — Telephone Encounter (Signed)
Patient is calling wishing to speak with Aimee Davis or somebody states she is stuck in bed with sever abdominal pain. Please advise

## 2022-09-06 NOTE — Telephone Encounter (Signed)
The pt has been advised of the recommendations per Aimee Davis.  She will go to the ED if pain persist.

## 2022-09-06 NOTE — Telephone Encounter (Signed)
CT scheduled for 12/28

## 2022-09-06 NOTE — Telephone Encounter (Signed)
FYI The dicyclomine causing her to have insomnia

## 2022-09-06 NOTE — Telephone Encounter (Signed)
The pt states that she has continued abd discomfort.  She has diarrhea at this time with alternating constipation at times.  Last BM was this morning and it was very loose.  NO bleeding or mucous noted.  She has Bentyl on hand but says she can not take it due to insomnia.  She is taking IBgard with little relief.  She is also taking protonix 40 mg daily.  She was prescribed Amitiza for the constipation episodes but is not taking it now.  She no showed for the CT scan that was recommended.  She is going to call and get that rescheduled now.  She asked for pain meds but was told we do not prescribe narcotics.  FYI anything further

## 2022-09-08 ENCOUNTER — Encounter (HOSPITAL_BASED_OUTPATIENT_CLINIC_OR_DEPARTMENT_OTHER): Payer: Self-pay | Admitting: Emergency Medicine

## 2022-09-08 ENCOUNTER — Emergency Department (HOSPITAL_BASED_OUTPATIENT_CLINIC_OR_DEPARTMENT_OTHER): Payer: Medicare Other

## 2022-09-08 ENCOUNTER — Emergency Department (HOSPITAL_BASED_OUTPATIENT_CLINIC_OR_DEPARTMENT_OTHER)
Admission: EM | Admit: 2022-09-08 | Discharge: 2022-09-09 | Disposition: A | Discharge status: Home / Self Care | Payer: Medicare Other | Attending: Emergency Medicine | Admitting: Emergency Medicine

## 2022-09-08 DIAGNOSIS — Z20822 Contact with and (suspected) exposure to covid-19: Secondary | ICD-10-CM | POA: Diagnosis not present

## 2022-09-08 DIAGNOSIS — R630 Anorexia: Secondary | ICD-10-CM | POA: Insufficient documentation

## 2022-09-08 DIAGNOSIS — Z7951 Long term (current) use of inhaled steroids: Secondary | ICD-10-CM | POA: Diagnosis not present

## 2022-09-08 DIAGNOSIS — R109 Unspecified abdominal pain: Secondary | ICD-10-CM | POA: Diagnosis present

## 2022-09-08 DIAGNOSIS — J449 Chronic obstructive pulmonary disease, unspecified: Secondary | ICD-10-CM | POA: Diagnosis not present

## 2022-09-08 DIAGNOSIS — R1030 Lower abdominal pain, unspecified: Secondary | ICD-10-CM | POA: Diagnosis not present

## 2022-09-08 DIAGNOSIS — R531 Weakness: Secondary | ICD-10-CM | POA: Insufficient documentation

## 2022-09-08 DIAGNOSIS — Z7982 Long term (current) use of aspirin: Secondary | ICD-10-CM | POA: Diagnosis not present

## 2022-09-08 DIAGNOSIS — R110 Nausea: Secondary | ICD-10-CM | POA: Diagnosis not present

## 2022-09-08 LAB — CBC WITH DIFFERENTIAL/PLATELET
Abs Immature Granulocytes: 0.01 10*3/uL (ref 0.00–0.07)
Basophils Absolute: 0 10*3/uL (ref 0.0–0.1)
Basophils Relative: 0 %
Eosinophils Absolute: 0 10*3/uL (ref 0.0–0.5)
Eosinophils Relative: 0 %
HCT: 41 % (ref 36.0–46.0)
Hemoglobin: 13.4 g/dL (ref 12.0–15.0)
Immature Granulocytes: 0 %
Lymphocytes Relative: 25 %
Lymphs Abs: 0.8 10*3/uL (ref 0.7–4.0)
MCH: 27.1 pg (ref 26.0–34.0)
MCHC: 32.7 g/dL (ref 30.0–36.0)
MCV: 83 fL (ref 80.0–100.0)
Monocytes Absolute: 0.3 10*3/uL (ref 0.1–1.0)
Monocytes Relative: 10 %
Neutro Abs: 2.1 10*3/uL (ref 1.7–7.7)
Neutrophils Relative %: 65 %
Platelets: 235 10*3/uL (ref 150–400)
RBC: 4.94 MIL/uL (ref 3.87–5.11)
RDW: 12.4 % (ref 11.5–15.5)
WBC: 3.3 10*3/uL — ABNORMAL LOW (ref 4.0–10.5)
nRBC: 0 % (ref 0.0–0.2)

## 2022-09-08 LAB — URINALYSIS, ROUTINE W REFLEX MICROSCOPIC
Bacteria, UA: NONE SEEN
Bilirubin Urine: NEGATIVE
Glucose, UA: NEGATIVE mg/dL
Hgb urine dipstick: NEGATIVE
Ketones, ur: NEGATIVE mg/dL
Nitrite: NEGATIVE
Protein, ur: NEGATIVE mg/dL
Specific Gravity, Urine: <1.005 — ABNORMAL LOW (ref 1.005–1.030)
pH: 5.5 (ref 5.0–8.0)

## 2022-09-08 LAB — COMPREHENSIVE METABOLIC PANEL
ALT: 25 U/L (ref 0–44)
AST: 28 U/L (ref 15–41)
Albumin: 4.5 g/dL (ref 3.5–5.0)
Alkaline Phosphatase: 58 U/L (ref 38–126)
Anion gap: 10 (ref 5–15)
BUN: 10 mg/dL (ref 8–23)
CO2: 26 mmol/L (ref 22–32)
Calcium: 10.1 mg/dL (ref 8.9–10.3)
Chloride: 102 mmol/L (ref 98–111)
Creatinine, Ser: 0.89 mg/dL (ref 0.44–1.00)
GFR, Estimated: >60 mL/min (ref >60)
Glucose, Bld: 108 mg/dL — ABNORMAL HIGH (ref 70–99)
Potassium: 3.9 mmol/L (ref 3.5–5.1)
Sodium: 138 mmol/L (ref 135–145)
Total Bilirubin: 0.7 mg/dL (ref 0.3–1.2)
Total Protein: 6.7 g/dL (ref 6.5–8.1)

## 2022-09-08 LAB — RESP PANEL BY RT-PCR (RSV, FLU A&B, COVID)  RVPGX2
Influenza A by PCR: NEGATIVE
Influenza B by PCR: NEGATIVE
Resp Syncytial Virus by PCR: NEGATIVE
SARS Coronavirus 2 by RT PCR: NEGATIVE

## 2022-09-08 LAB — LIPASE, BLOOD: Lipase: <10 U/L — ABNORMAL LOW (ref 11–51)

## 2022-09-08 MED ORDER — ONDANSETRON HCL 4 MG/2ML IJ SOLN
4.0000 mg | Freq: Once | INTRAMUSCULAR | Status: AC
  Administered 2022-09-08: 4 mg via INTRAVENOUS
  Filled 2022-09-08: qty 2

## 2022-09-08 MED ORDER — IOHEXOL 300 MG/ML  SOLN
100.0000 mL | Freq: Once | INTRAMUSCULAR | Status: AC | PRN
  Administered 2022-09-08: 80 mL via INTRAVENOUS

## 2022-09-08 MED ORDER — LACTATED RINGERS IV BOLUS
1000.0000 mL | Freq: Once | INTRAVENOUS | Status: AC
  Administered 2022-09-08: 1000 mL via INTRAVENOUS

## 2022-09-08 NOTE — Discharge Instructions (Addendum)
All the lab work today looked normal.  The CAT scan looked good.  The mass that had been in your pancreas is not there today.  This is most likely her IBS flaring up.  It will be important for you to follow-up with your GI doctor for further care.  Hopefully they will be able to find a medication that is helpful for you.  In the meantime try Ensure that is lactose-free.

## 2022-09-08 NOTE — ED Triage Notes (Signed)
Pt here from home with c/o weakness started earlier this week , unknown fevers along with some abd pain

## 2022-09-08 NOTE — ED Notes (Signed)
Pt taken to CT.

## 2022-09-08 NOTE — ED Provider Notes (Signed)
[facility] EMERGENCY DEPT Provider Note   CSN: [id number] Arrival date & time: 09/08/22  1844     History  No chief complaint on file.   Aimee Davis is a 66 y.o. female.  Patient is a 66 year old female with a history of IBS-C, COPD who is presenting today with complaints of abdominal pain, decreased oral intake, generalized weakness.  She denies any urinary symptoms and does not know if she has had a fever or not but 1 night woke up with some sweats.  She has not had significant cough Congestion or upper respiratory symptoms.  Patient reports this does feel similar to prior episodes when she has had flares of her IBS but this feels a little worse and a little bit different.  She has not had any blood in her stool and reports when she eats she has to go to the bathroom immediately and her stool has been soft but she has not had any diarrhea.  She denies any vomiting but does intermittently feel nauseated.  She feels like there is nothing she can eat because everything makes her feel bad.  She has been following up with GI but they have tried dicyclomine which makes her feel like she cannot get any sleep and causes insomnia and she has tried other medications that she does not feel have been helpful.  She was scheduled for a CAT scan later this month for further evaluation of the pancreatic lesion.  The history is provided by the patient.       Home Medications Prior to Admission medications   Medication Sig Start Date End Date Taking? Authorizing Provider  albuterol (VENTOLIN HFA) 108 (90 Base) MCG/ACT inhaler USE 2 INHALATIONS BY MOUTH EVERY 6 HOURS AS NEEDED FOR WHEEZING  OR SHORTNESS OF BREATH 03/13/22   Lacinda Axon, MD  Artificial Tear Ointment (DRY EYES OP) Apply 1 drop to eye daily as needed (dry eyes).    [provider]  Ascorbic Acid (VITAMIN C) 100 MG tablet Take 50 mg by mouth daily.    [provider]  aspirin EC 81 MG tablet Take 1  tablet (81 mg total) by mouth daily. Swallow whole. Patient not taking: Reported on 08/01/2022 02/24/21   Richardson Dopp T, PA-C  B Complex-C (B-COMPLEX WITH VITAMIN C) tablet Take 1 tablet by mouth daily. Patient not taking: Reported on 08/01/2022    [provider]  cholecalciferol (VITAMIN D3) 25 MCG (1000 UNIT) tablet Take 1,000 Units by mouth daily.    [provider]  dicyclomine (BENTYL) 10 MG capsule Take 1 capsule (10 mg total) by mouth 4 (four) times daily -  before meals and at bedtime. Patient not taking: Reported on 08/01/2022 07/16/22 08/15/22  Sanjuan Dame, MD  ezetimibe (ZETIA) 10 MG tablet Take 1 tablet (10 mg total) by mouth daily. Patient not taking: Reported on 08/01/2022 07/31/22   Freada Bergeron, MD  fexofenadine (ALLEGRA) 180 MG tablet Take 180 mg by mouth daily.    [provider]  Fluticasone-Umeclidin-Vilant (TRELEGY ELLIPTA) 200-62.5-25 MCG/ACT AEPB USE 1 INHALATION BY MOUTH ONCE  DAILY 07/16/22   Sanjuan Dame, MD  lubiprostone (AMITIZA) 8 MCG capsule Take 1 capsule (8 mcg total) by mouth 2 (two) times daily with a meal. Patient not taking: Reported on 08/01/2022 07/02/22   Starlyn Skeans, MD  Magnesium 400 MG TABS Take 2 each by mouth daily. Patient not taking: Reported on 08/01/2022    [provider]  mometasone (NASONEX) 50  MCG/ACT nasal spray PLACE 2 SPRAYS IN EACH NOSTRIL ONCE DAILY AS NEEDED. 06/15/21   Maudie Mercury, MD  Multiple Vitamins-Minerals (MULTIVITAMIN WITH MINERALS) tablet Take 1 tablet by mouth daily.    [provider]  omega-3 acid ethyl esters (LOVAZA) 1 g capsule Take by mouth 2 (two) times daily. Patient not taking: Reported on 08/01/2022    [provider]  OVER THE COUNTER MEDICATION EO MEGA Essential Oil - 2 daily    [provider]  pantoprazole (PROTONIX) 40 MG tablet TAKE 1 TABLET BY MOUTH DAILY 30  TO 60 MINUTES BEFORE BREAKFAST 07/02/22   Mapp, Tavien, MD   Peppermint Oil (IBGARD) 90 MG CPCR Take 1 capsule by mouth 2 (two) times daily as needed. Take as directed as needed Patient not taking: Reported on 08/01/2022 12/07/19   Yetta Flock, MD      Allergies    Dicyclomine, Hydrocodone, Lipitor [atorvastatin], Penicillins, and Levsin [hyoscyamine]    Review of Systems   Review of Systems  Physical Exam Updated Vital Signs BP (!) 143/91   Pulse 64   Temp 98.2 F (36.8 C)   Resp 18   SpO2 100%  Physical Exam Vitals and nursing note reviewed.  Constitutional:      General: She is not in acute distress.    Appearance: She is well-developed.  HENT:     Head: Normocephalic and atraumatic.  Eyes:     Pupils: Pupils are equal, round, and reactive to light.  Cardiovascular:     Rate and Rhythm: Normal rate and regular rhythm.     Heart sounds: Normal heart sounds. No murmur heard.    No friction rub.  Pulmonary:     Effort: Pulmonary effort is normal.     Breath sounds: Normal breath sounds. No wheezing or rales.  Abdominal:     General: Bowel sounds are normal. There is no distension.     Palpations: Abdomen is soft.     Tenderness: There is abdominal tenderness in the right lower quadrant, suprapubic area and left lower quadrant. There is no guarding or rebound.  Musculoskeletal:        General: No tenderness. Normal range of motion.     Comments: No edema  Skin:    General: Skin is warm and dry.     Findings: No rash.  Neurological:     Mental Status: She is alert and oriented to person, place, and time.     Cranial Nerves: No cranial nerve deficit.  Psychiatric:        Behavior: Behavior normal.     ED Results / Procedures / Treatments   Labs (all labs ordered are listed, but only abnormal results are displayed) Labs Reviewed  COMPREHENSIVE METABOLIC PANEL - Abnormal; Notable for the following components:      Result Value   Glucose, Bld 108 (*)    All other components within normal limits  LIPASE, BLOOD -  Abnormal; Notable for the following components:   Lipase <10 (*)    All other components within normal limits  CBC WITH DIFFERENTIAL/PLATELET - Abnormal; Notable for the following components:   WBC 3.3 (*)    All other components within normal limits  URINALYSIS, ROUTINE W REFLEX MICROSCOPIC - Abnormal; Notable for the following components:   Color, Urine COLORLESS (*)    APPearance HAZY (*)    Specific Gravity, Urine <1.005 (*)    Leukocytes,Ua MODERATE (*)    All other components within normal limits  RESP PANEL BY RT-PCR (RSV, FLU A&B, COVID)  [id number]    EKG None  Radiology CT ABDOMEN PELVIS W CONTRAST  Result Date: 09/08/2022 CLINICAL DATA:  Abdominal pain, acute, nonlocalized. EXAM: CT ABDOMEN AND PELVIS WITH CONTRAST TECHNIQUE: Multidetector CT imaging of the abdomen and pelvis was performed using the standard protocol following bolus administration of intravenous contrast. RADIATION DOSE REDUCTION: This exam was performed according to the departmental dose-optimization program which includes automated exposure control, adjustment of the mA and/or kV according to patient size and/or use of iterative reconstruction technique. CONTRAST:  18m OMNIPAQUE IOHEXOL 300 MG/ML  SOLN COMPARISON:  09/30/2021. FINDINGS: Lower chest: Mild emphysematous changes in the lungs. No acute abnormality. Hepatobiliary: Subcentimeter hypodensity in the right lobe of the liver which is too small to further characterize. No biliary ductal dilatation. The gallbladder is without stones. Pancreas: Unremarkable. No pancreatic ductal dilatation or surrounding inflammatory changes. Spleen: Normal in size without focal abnormality. Adrenals/Urinary Tract: No adrenal nodule or mass. The kidneys enhance symmetrically. No renal calculus or hydronephrosis. The bladder is unremarkable. Stomach/Bowel: Stomach is within normal limits. Appendix appears normal. No evidence of bowel wall thickening, distention, or inflammatory  changes. No free air or pneumatosis. Vascular/Lymphatic: Aortic atherosclerosis. No enlarged abdominal or pelvic lymph nodes. Reproductive: Status post hysterectomy. No adnexal masses. Other: No abdominopelvic ascites. Small fat containing umbilical hernia. Musculoskeletal: No acute osseous abnormality. IMPRESSION: 1. No acute intra-abdominal process. 2. Emphysema. 3. Aortic atherosclerosis. Electronically Signed   By: LBrett FairyM.D.   On: 09/08/2022 22:46    Procedures Procedures    Medications Ordered in ED Medications  lactated ringers bolus 1,000 mL (1,000 mLs Intravenous New Bag/Given 09/08/22 2206)  ondansetron (ZOFRAN) injection 4 mg (4 mg Intravenous Given 09/08/22 2206)  iohexol (OMNIPAQUE) 300 MG/ML solution 100 mL (80 mLs Intravenous Contrast Given 09/08/22 2213)    ED Course/ Medical Decision Making/ A&P                           Medical Decision Making Amount and/or Complexity of Data Reviewed Labs: ordered. Decision-making details documented in ED Course. Radiology: ordered and independent interpretation performed. Decision-making details documented in ED Course.  Risk Prescription drug management.   Pt with multiple medical problems and comorbidities and presenting today with a complaint that caries a high risk for morbidity and mortality.  Here today with complaints of abdominal pain which is nonspecific as well is feeling generally weak.  Concern for dehydration, electrolyte abnormality, AKI, diverticulitis versus flare of her IBS-C.  Patient has started no new medications.  She is nontoxic on exam with reassuring vital signs.  I dependently interpreted her labs today UA without significant signs for infection, CMP within normal limits, CBC unchanged from baseline as she always has mild leukopenia and lipase is within normal limits, COVID flu and RSV are negative. I have independently visualized and interpreted pt's images today.  CT without evidence of obstruction,  masses.  Pancreatic mass that had been mentioned in the past is no longer present today.  Findings were discussed with the patient.  After IV fluids she was discharged home to follow-up with GI.         Final Clinical Impression(s) / ED Diagnoses Final diagnoses:  Lower abdominal pain    Rx / DC Orders ED Discharge Orders     None         PBlanchie Dessert MD 09/08/22 2337

## 2022-09-13 ENCOUNTER — Ambulatory Visit (HOSPITAL_COMMUNITY): Admission: RE | Admit: 2022-09-13 | Payer: Medicare Other | Source: Ambulatory Visit

## 2022-09-25 ENCOUNTER — Telehealth: Payer: Self-pay

## 2022-09-25 NOTE — Telephone Encounter (Signed)
        Patient  visited [facility] on 09/08/2022  for Lower abdominal pain.   Telephone encounter attempt :  1st  A HIPAA compliant voice message was left requesting a return call.  Instructed patient to call back at [phone].   [place] Resource Care Guide   [email or web address]  ?? [phone]   Website: [email or web address]  [email or web address]

## 2022-09-27 ENCOUNTER — Other Ambulatory Visit: Payer: Self-pay

## 2022-09-27 ENCOUNTER — Telehealth: Payer: Self-pay

## 2022-09-27 NOTE — Telephone Encounter (Signed)
        Patient  visited [facility] on 09/09/2022  for lower abdominal pain.   Telephone encounter attempt :  2nd  A HIPAA compliant voice message was left requesting a return call.  Instructed patient to call back at [phone].   [place] Resource Care Guide   [email or web address]  ?? [phone]   Website: [email or web address]  [email or web address]

## 2022-09-30 ENCOUNTER — Other Ambulatory Visit: Payer: Self-pay

## 2022-10-05 ENCOUNTER — Encounter: Payer: Self-pay | Admitting: Gastroenterology

## 2022-10-05 ENCOUNTER — Ambulatory Visit (INDEPENDENT_AMBULATORY_CARE_PROVIDER_SITE_OTHER): Payer: 59 | Admitting: Gastroenterology

## 2022-10-05 VITALS — BP 118/74 | HR 71 | Ht 63.0 in | Wt 145.0 lb

## 2022-10-05 DIAGNOSIS — K589 Irritable bowel syndrome without diarrhea: Secondary | ICD-10-CM | POA: Diagnosis not present

## 2022-10-05 DIAGNOSIS — R194 Change in bowel habit: Secondary | ICD-10-CM | POA: Insufficient documentation

## 2022-10-05 DIAGNOSIS — R109 Unspecified abdominal pain: Secondary | ICD-10-CM | POA: Diagnosis not present

## 2022-10-05 NOTE — Progress Notes (Signed)
10/05/2022 Aimee Davis [id number] [birth year]   HISTORY OF PRESENT ILLNESS: This is a 67 year old female who is a patient Dr. Doyne Keel.  She has history of IBS-C.  Was last seen by me in November 2023.  She continues to complain of now alternating bowel habits between constipation and diarrhea (previous only constipation and had used Linzess and Miralax in the past).  Continues to complain of abdominal pain in the epigastrium and in the mid abdomen that she says is a hard spasm.  Tells me she is not able to eat because of the pain.  Does not want to eat because of the pain.  She keeps talking about how she is losing weight although it appears her weight ahs been stable for quite some time.  Recent CBC, CMP, lipase all unremarkable.  Albumin and protein levels are normal.  She has had a CT scan of the abdomen and pelvis with contrast on 09/08/22 that showed no cause of abdominal pain or her symptoms.  She says that IBgard causes her to belch, tells me dicyclomine made her not be able to sleep.  Says that she has tried Levsin in the past.   Colonoscopy 11/2018:   - One diminutive polyp in the cecum, removed with a cold snare. Resected and retrieved. - Tortuous colon. - Internal hemorrhoids. - The examination was otherwise normal.   Tubular adenoma.  Repeat 7 years.     EGD 08/2021:   - Esophagogastric landmarks identified. - Normal esophagus. - Normal stomach. Biopsied to rule out H pylori. - Erythematous duodenum - mild. Biopsied. Otherwise normal duodenum.   1. Surgical [P], small intestine - DUODENAL MUCOSA WITH NORMAL VILLOUS ARCHITECTURE. - NO VILLOUS ATROPHY OR INCREASED INTRAEPITHELIAL LYMPHOCYTES. 2. Surgical [P], gastric antrum and gastric body - ANTRAL AND OXYNTIC MUCOSA WITH MILD CHANGES OF REACTIVE GASTROPATHY. - NO HELICOBACTER PYLORI IDENTIFIED.   Past Medical History:  Diagnosis Date   Abdominal pain 08/16/2008   Qualifier: Diagnosis of  By: Hassell Done FNP,  [name]     Abdominal pain, epigastric 09/08/2019   Allergy    seasonal, PCN, antidepressants, bentyl   Aortic atherosclerosis ([place]) 02/23/2021   Arthritis    Asthma    Asymptomatic bacteriuria 08/24/2021   UA showed leukocytes but patient does not have dysuria, fevers or hematuria. She also had bacteruria in her last UA. Her symptoms of fatigue, and loss of appetite are non-specific but if persist or she begins having dysuria we can work up further. A UTI would not explain the abdominal pain patient is having.    Carpal tunnel syndrome on both sides    right worse than left 2008   Cataract 06/11/2013   COPD (chronic obstructive pulmonary disease) ([place])    COVID-19 virus infection 06/18/2016   Eczema 05/02/2018   Food poisoning 04/04/2021   GERD (gastroesophageal reflux disease)    Hair loss 07/07/2019   Hyperlipidemia    LDL 108 JULY 2013   IBS (irritable bowel syndrome)    Macular rash 05/20/2019   Multiple nevi 03/01/2015   Pneumonia    Positive TB test    From [facility] 2009 note : Recent F/u at [facility] CXRAy negative.  Positive TST in 2007 (79m).  Unable to tolerate INH due to hepatotoxicity   Primary insomnia 06/05/2012   Skin macule 04/21/2019   Tinnitus 07/07/2019   URI (upper respiratory infection) 11/01/2010   Qualifier: Diagnosis of  By: MHassell DoneFNP, [name]    Past Surgical History:  Procedure Laterality Date   BREAST CYST EXCISION Right 2011   BREAST EXCISIONAL BIOPSY     benign   BREAST EXCISIONAL BIOPSY Right 1990   benign   INDUCED ABORTION  2005   VAGINAL HYSTERECTOMY  1983   partial; for abnormal bleeding    reports that she quit smoking about 9 years ago. Her smoking use included cigarettes. She has a 14.19 pack-year smoking history. She has never used smokeless tobacco. She reports that she does not currently use drugs. She reports that she does not drink alcohol. family history includes Aneurysm in her brother; Arthritis in her brother, mother, and sister; Breast  cancer in her sister and sister; Diabetes in her father, mother, and sister; Heart disease in her father and mother; Hyperlipidemia in her father; Hypertension in her father, maternal aunt, maternal uncle, mother, paternal aunt, paternal uncle, sister, sister, and sister; Kidney disease in her father, paternal aunt, and paternal uncle; Mental illness in her brother; Stomach cancer in her maternal uncle; Stroke in her father. Allergies  Allergen Reactions   Dicyclomine     confusion   Hydrocodone Other (See Comments)    Recovering addict X 29 years; no narcotics per patient   Lipitor [Atorvastatin] Other (See Comments)    Pt reports causes upset stomach, cramps in legs, poor appetite, and sleep disturbance   Penicillins Other (See Comments)    REACTION: rash   Levsin [Hyoscyamine] Anxiety      Outpatient Encounter Medications as of 10/05/2022  Medication Sig   albuterol (VENTOLIN HFA) 108 (90 Base) MCG/ACT inhaler USE 2 INHALATIONS BY MOUTH EVERY 6 HOURS AS NEEDED FOR WHEEZING  OR SHORTNESS OF BREATH   Artificial Tear Ointment (DRY EYES OP) Apply 1 drop to eye daily as needed (dry eyes).   Ascorbic Acid (VITAMIN C) 100 MG tablet Take 50 mg by mouth daily.   B Complex-C (B-COMPLEX WITH VITAMIN C) tablet Take 1 tablet by mouth daily.   cholecalciferol (VITAMIN D3) 25 MCG (1000 UNIT) tablet Take 1,000 Units by mouth daily.   fexofenadine (ALLEGRA) 180 MG tablet Take 180 mg by mouth daily.   Fluticasone-Umeclidin-Vilant (TRELEGY ELLIPTA) 200-62.5-25 MCG/ACT AEPB USE 1 INHALATION BY MOUTH ONCE  DAILY   lubiprostone (AMITIZA) 8 MCG capsule TAKE 1 CAPSULE(8 MCG) BY MOUTH TWICE DAILY WITH A MEAL   mometasone (NASONEX) 50 MCG/ACT nasal spray PLACE 2 SPRAYS IN EACH NOSTRIL ONCE DAILY AS NEEDED.   Multiple Vitamins-Minerals (MULTIVITAMIN WITH MINERALS) tablet Take 1 tablet by mouth daily.   OVER THE COUNTER MEDICATION EO MEGA Essential Oil - 2 daily   pantoprazole (PROTONIX) 40 MG tablet TAKE 1 TABLET  BY MOUTH DAILY 30 TO 60 MINUTES BEFORE BREAKFAST   [DISCONTINUED] aspirin EC 81 MG tablet Take 1 tablet (81 mg total) by mouth daily. Swallow whole. (Patient not taking: Reported on 08/01/2022)   [DISCONTINUED] dicyclomine (BENTYL) 10 MG capsule Take 1 capsule (10 mg total) by mouth 4 (four) times daily -  before meals and at bedtime. (Patient not taking: Reported on 08/01/2022)   [DISCONTINUED] ezetimibe (ZETIA) 10 MG tablet Take 1 tablet (10 mg total) by mouth daily. (Patient not taking: Reported on 08/01/2022)   [DISCONTINUED] Magnesium 400 MG TABS Take 2 each by mouth daily. (Patient not taking: Reported on 08/01/2022)   [DISCONTINUED] omega-3 acid ethyl esters (LOVAZA) 1 g capsule Take by mouth 2 (two) times daily. (Patient not taking: Reported on 08/01/2022)   [DISCONTINUED] Peppermint Oil (IBGARD) 90 MG CPCR Take 1 capsule by  mouth 2 (two) times daily as needed. Take as directed as needed (Patient not taking: Reported on 08/01/2022)   No facility-administered encounter medications on file as of 10/05/2022.     REVIEW OF SYSTEMS  : All other systems reviewed and negative except where noted in the History of Present Illness.   PHYSICAL EXAM: BP 118/74 (BP Location: Left Arm, Patient Position: Sitting, Cuff Size: Normal)   Pulse 71   Ht '5\' 3"'$  (1.6 m)   Wt 145 lb (65.8 kg)   SpO2 97%   BMI 25.69 kg/m  General: Well developed female in no acute distress Head: Normocephalic and atraumatic Eyes:  Sclerae anicteric, conjunctiva pink. Ears: Normal auditory acuity Lungs: Clear throughout to auscultation; no W/R/R. Heart: Regular rate and rhythm; no M/R/G. Abdomen: Soft, non-distended.  BS present.  Mild upper abdominal TTP. Musculoskeletal: Symmetrical with no gross deformities  Skin: No lesions on visible extremities Extremities: No edema  Neurological: Alert oriented x 4, grossly non-focal Psychological:  Alert and cooperative. Normal mood and affect  ASSESSMENT AND PLAN: *IBS with  constipation: Previously on Linzess, but says that she had problems with that.  Says that MiraLAX did not help.  She was taking prune lax, which she said helped for a while but then stopped working.  Was recently given Amitiza 8 mcg twice daily with food, but she admits that she did not take it regularly.  She continues to complain of alternating bowel habits between constipation and diarrhea and abdominal pain/spasms.  Dicyclomine she says made her not be able to sleep.  Says that she tried Levsin in the past as well.  Says the IBgard causes her to belch.  Talks a lot about losing weight and not getting nutrition, but her albumin and protein levels were normal on recent labs and her weight has been stable or even a pound or 2 up from over the past several months to a year or more.  Will check a SIBO breath test.  She would like to see a dietitian to go over the FODMAP diet that we discussed.  Will place that referral.  Can try some FD guard samples that were given her today.  She will follow-up with Dr. Havery Moros. *Epigastric abdominal pain: Seen by me just over a year ago for the same pain for which she underwent EGD with minimal findings.  Continues to complain of epigastric abdominal pain despite pantoprazole 40 mg daily. *Pancreatic lesion: CT scan inJuly 2022 showed an indeterminate 3 mm hypodensity over the body of the pancreas which they recommended MRI/MRCP or pancreatic protocol CT scan follow-up in 2 years.  Recent CT shows normal pancreas.   CC:  Lacinda Axon, MD

## 2022-10-05 NOTE — Patient Instructions (Signed)
_______________________________________________________  If your blood pressure at your visit was 140/90 or greater, please contact your primary care physician to follow up on this.  _______________________________________________________  If you are age 69 or older, your body mass index should be between 23-30. Your Body mass index is 25.69 kg/m. If this is out of the aforementioned range listed, please consider follow up with your Primary Care Provider.  If you are age 24 or younger, your body mass index should be between 19-25. Your Body mass index is 25.69 kg/m. If this is out of the aformentioned range listed, please consider follow up with your Primary Care Provider.   ________________________________________________________  The [facility] providers would like to encourage you to use [facility] to communicate with providers for non-urgent requests or questions.  Due to long hold times on the telephone, sending your provider a message by [facility] may be a faster and more efficient way to get a response.  Please allow 48 business hours for a response.  Please remember that this is for non-urgent requests.  _______________________________________________________  Aimee Davis have been given a testing kit to check for small intestine bacterial overgrowth (SIBO) which is completed by a company named [employer]. Make sure to return your test in the mail using the return mailing label given to you along with the kit. Your demographic and insurance information have already been sent to the company and they should be in contact with you over the next 1-2 weeks regarding this test. [facility] will collect an upfront charge of $99.74 for commercial insurance plans and $209.74 is you are paying cash. Make sure to discuss with [facility] PRIOR to having the test to see if they have gotten information from your insurance company as to how much your testing will cost out of pocket, if any. Please keep in  mind that you will be getting a call from phone number [phone] or a similar number. If you do not hear from them within this time frame, please call our office at [phone] or call [facility] directly at [phone].   You will hear from the nutrition people regarding an appointment.  You have been given samples of FD Donald Prose - take as directed.  Low-FODMAP Eating Plan  FODMAP stands for fermentable oligosaccharides, disaccharides, monosaccharides, and polyols. These are sugars that are hard for some people to digest. A low-FODMAP eating plan may help some people who have irritable bowel syndrome (IBS) and certain other bowel (intestinal) diseases to manage their symptoms. This meal plan can be complicated to follow. Work with a diet and nutrition specialist (dietitian) to make a low-FODMAP eating plan that is right for you. A dietitian can help make sure that you get enough nutrition from this diet. What are tips for following this plan? Reading food labels Check labels for hidden FODMAPs such as: High-fructose syrup. Honey. Agave. Natural fruit flavors. Onion or garlic powder. Choose low-FODMAP foods that contain 3-4 grams of fiber per serving. Check food labels for serving sizes. Eat only one serving at a time to make sure FODMAP levels stay low. Shopping Shop with a list of foods that are recommended on this diet and make a meal plan. Meal planning Follow a low-FODMAP eating plan for up to 6 weeks, or as told by your health care provider or dietitian. To follow the eating plan: Eliminate high-FODMAP foods from your diet completely. Choose only low-FODMAP foods to eat. You will do this for 2-6 weeks. Gradually reintroduce high-FODMAP foods into your diet one at a  time. Most people should wait a few days before introducing the next new high-FODMAP food into their meal plan. Your dietitian can recommend how quickly you may reintroduce foods. Keep a daily record of what and  how much you eat and drink. Make note of any symptoms that you have after eating. Review your daily record with a dietitian regularly to identify which foods you can eat and which foods you should avoid. General tips Drink enough fluid each day to keep your urine pale yellow. Avoid processed foods. These often have added sugar and may be high in FODMAPs. Avoid most dairy products, whole grains, and sweeteners. Work with a dietitian to make sure you get enough fiber in your diet. Avoid high FODMAP foods at meals to manage symptoms. Recommended foods Fruits Bananas, oranges, tangerines, lemons, limes, blueberries, raspberries, strawberries, grapes, cantaloupe, honeydew melon, kiwi, papaya, passion fruit, and pineapple. Limited amounts of dried cranberries, banana chips, and shredded coconut. Vegetables Eggplant, zucchini, cucumber, peppers, green beans, bean sprouts, lettuce, arugula, kale, Swiss chard, spinach, collard greens, bok choy, summer squash, potato, and tomato. Limited amounts of corn, carrot, and sweet potato. Green parts of scallions. Grains Gluten-free grains, such as rice, oats, buckwheat, quinoa, corn, polenta, and millet. Gluten-free pasta, bread, or cereal. Rice noodles. Corn tortillas. Meats and other proteins Unseasoned beef, pork, poultry, or fish. Eggs. Berniece Salines. Tofu (firm) and tempeh. Limited amounts of nuts and seeds, such as almonds, walnuts, [place] nuts, pecans, peanuts, nut butters, pumpkin seeds, chia seeds, and sunflower seeds. Dairy Lactose-free milk, yogurt, and kefir. Lactose-free cottage cheese and ice cream. Non-dairy milks, such as almond, coconut, hemp, and rice milk. Non-dairy yogurt. Limited amounts of goat cheese, brie, mozzarella, parmesan, swiss, and other hard cheeses. Fats and oils Butter-free spreads. Vegetable oils, such as olive, canola, and sunflower oil. Seasoning and other foods Artificial sweeteners with names that do not end in "ol," such as  aspartame, saccharine, and stevia. Maple syrup, white table sugar, raw sugar, brown sugar, and molasses. Mayonnaise, soy sauce, and tamari. Fresh basil, coriander, parsley, rosemary, and thyme. Beverages Water and mineral water. Sugar-sweetened soft drinks. Small amounts of orange juice or cranberry juice. Black and green tea. Most dry wines. Coffee. The items listed above may not be a complete list of foods and beverages you can eat. Contact a dietitian for more information. Foods to avoid Fruits Fresh, dried, and juiced forms of apple, pear, watermelon, peach, plum, cherries, apricots, blackberries, boysenberries, figs, nectarines, and mango. Avocado. Vegetables Chicory root, artichoke, asparagus, cabbage, snow peas, Brussels sprouts, broccoli, sugar snap peas, mushrooms, celery, and cauliflower. Onions, garlic, leeks, and the white part of scallions. Grains Wheat, including kamut, durum, and semolina. Barley and bulgur. Couscous. Wheat-based cereals. Wheat noodles, bread, crackers, and pastries. Meats and other proteins Fried or fatty meat. Sausage. Cashews and pistachios. Soybeans, baked beans, black beans, chickpeas, kidney beans, fava beans, navy beans, lentils, black-eyed peas, and split peas. Dairy Milk, yogurt, ice cream, and soft cheese. Cream and sour cream. Milk-based sauces. Custard. Buttermilk. Soy milk. Seasoning and other foods Any sugar-free gum or candy. Foods that contain artificial sweeteners such as sorbitol, mannitol, isomalt, or xylitol. Foods that contain honey, high-fructose corn syrup, or agave. Bouillon, vegetable stock, beef stock, and chicken stock. Garlic and onion powder. Condiments made with onion, such as hummus, chutney, pickles, relish, salad dressing, and salsa. Tomato paste. Beverages Chicory-based drinks. Coffee substitutes. Chamomile tea. Fennel tea. Sweet or fortified wines such as port or sherry. Diet soft drinks made  with isomalt, mannitol, maltitol,  sorbitol, or xylitol. Apple, pear, and mango juice. Juices with high-fructose corn syrup. The items listed above may not be a complete list of foods and beverages you should avoid. Contact a dietitian for more information. Summary FODMAP stands for fermentable oligosaccharides, disaccharides, monosaccharides, and polyols. These are sugars that are hard for some people to digest. A low-FODMAP eating plan is a short-term diet that helps to ease symptoms of certain bowel diseases. The eating plan usually lasts up to 6 weeks. After that, high-FODMAP foods are reintroduced gradually and one at a time. This can help you find out which foods may be causing symptoms. A low-FODMAP eating plan can be complicated. It is best to work with a dietitian who has experience with this type of plan. This information is not intended to replace advice given to you by your health care provider. Make sure you discuss any questions you have with your health care provider. Document Revised: 01/21/2020 Document Reviewed: 01/21/2020 Elsevier Patient Education  [place].

## 2022-10-08 ENCOUNTER — Telehealth: Payer: Self-pay | Admitting: Gastroenterology

## 2022-10-08 NOTE — Progress Notes (Signed)
Agree with assessment / plan as outlined.  

## 2022-10-08 NOTE — Telephone Encounter (Signed)
Patient is calling states the nutritionist that she was referred to doesn't accept her insurance and is requesting a referral to [facility] because they do accept her insurance. Patient is requesting a phone call when done. Please advise

## 2022-10-08 NOTE — Telephone Encounter (Signed)
Records have been sent to [facility].  The pt has been advised to let us know if she has any further concerns.

## 2022-10-08 NOTE — Telephone Encounter (Signed)
Patient is returning your call. Informed her of records being sent, patient had no further concerns.

## 2022-10-09 NOTE — Telephone Encounter (Signed)
Pt aware that all records faxed- I have asked her to let me know if there is another fax number to sent to

## 2022-10-09 NOTE — Telephone Encounter (Signed)
Inbound call from patient stating [facility] still has not received paperwork. Please advise.

## 2022-10-10 NOTE — Telephone Encounter (Signed)
Patient called, stated referral was not sent to correct fax. Patient states correct fax number is [phone]. Attn: Cathryn Dormann. Patient states office said if we had any questions to call [phone]. Please advise.

## 2022-10-10 NOTE — Telephone Encounter (Signed)
The records were faxed to the correct fax number.  Will fax again.

## 2022-10-11 NOTE — Telephone Encounter (Signed)
Inbound call from patient in regarding to records faxed . Please advise.  Thank you

## 2022-10-11 NOTE — Telephone Encounter (Signed)
Per pt records faxed to [phone] She does not know where this office is or the name.  She says she was given this information by her insurance company. She did give me a phone number [phone] I called and was told that the name of the practice is [facility].   I have faxed all records and made pt aware.

## 2022-10-17 ENCOUNTER — Telehealth: Payer: Self-pay | Admitting: Dietician

## 2022-10-17 NOTE — Telephone Encounter (Signed)
Aimee Davis says GI is trying to get her in with a dietitian for a Low FODMAP diet and wanted help finding a dietitian who can help her.  She has information but says she finds it confusing and doubts it's ability to help her. She says she wants to try acupuncture which has helped in the past.   I explained that her insurance likely will not cover her seeing a dietitian and the general principles of the [place]. We agreed that I would mail her additional information from the Nutrition care manual and she can call me for questions. I also suggested she call to [facility] about her insurance coverage.

## 2022-10-17 NOTE — Telephone Encounter (Signed)
Okay, that sounds good. Thank you for updating me.

## 2022-10-28 ENCOUNTER — Other Ambulatory Visit: Payer: Self-pay

## 2022-11-04 ENCOUNTER — Other Ambulatory Visit: Payer: Self-pay

## 2022-11-06 ENCOUNTER — Other Ambulatory Visit: Payer: Self-pay | Admitting: Student

## 2022-11-06 DIAGNOSIS — Z1231 Encounter for screening mammogram for malignant neoplasm of breast: Secondary | ICD-10-CM

## 2022-11-21 ENCOUNTER — Telehealth: Payer: Self-pay | Admitting: *Deleted

## 2022-11-21 ENCOUNTER — Telehealth: Payer: Self-pay | Admitting: Cardiology

## 2022-11-21 NOTE — Telephone Encounter (Signed)
Pt called both our office and her PCP office with complaints as mentioned below. See note copied from PCP note that was sent to [place] about her complaints:  Pt stated over the last 1-2 weeks she has been experiencing unusual cardiac symptoms.  She reports "my heart beat feels slow and fast at times."  She states it feels like fluttering in her chest.  She states this is intermittent in nature but keeps reoccurring.  She states that it is hard to determine if it is palpitations or chest pain.  She also states when this occurs she gets dizzy and has to sit down.  She states "I get more winded when I walk."  She denies sob at rest.  Pt does not monitor her heart rate or pressures at home, and does not have the capability to take this.  She states she his mild swelling to her feet and ankles.  She is not weighing herself at home but will start dry weighing and recording this.  She is not having any active cardiac complaints at this time.  She denied other cardiac symptoms like orthopnea, pre-syncope or syncope.  She denies N/V and diaphoresis, but states "I always have an upset stomach due to my IBS."  Informed the pt that I do not have any availabilities today or tomorrow.  Did schedule her to see the DOD Dr. Ali Lowe for this Friday 3/8 at 1130.  Advised her to arrive 15 mins prior to this appt.  Also advised her to bring her weight recordings with her to this visit.  Asked her to inquire from Dr. Ali Lowe and RN at that visit if she would be a candidate to receive a BP cuff from our office, and if so, could they provide her with one.  Advised the pt to start wearing her compression stockings during the day, hydrate with water, change positions slowly, and watch her salt intake.  Advised her to elevate extremities at rest.   Pt does have a history of CAD. She is intolerant to the majority of cholesterol lowering medications we have tried her on in the past.  ED precautions provided to the pt if symptoms were to  return/worsen/persist between now and her appt with DOD Dr. Ali Lowe on 3/8.  Informed the pt that I will route this message to Dr. Johney Frame to make her aware of this plan.  Pt verbalized understanding and agrees with this plan.   She is not weighing herself at home either.       Ducatte, Laurenze L, RN11 minutes ago (2:19 PM)    Patient called in c/o 3 week hx of feeling "heart beat is too fast or too slow." Has not checked pulse manually. Episodes last 2-3 seconds and occur 3-4 times per day. Also, notes SHOB during these episodes. Denies CP but states it feels "uncomfortable." States she sits and rests and symptoms go away. There are no openings in [facility] this week. She states she has also left a message with [facility] to call her back. She is advised to head to UC/ED if unable to schedule an appt with [facility] this week.

## 2022-11-21 NOTE — Telephone Encounter (Signed)
Patient called in c/o 3 week hx of feeling "heart beat is too fast or too slow." Has not checked pulse manually. Episodes last 2-3 seconds and occur 3-4 times per day. Also, notes SHOB during these episodes. Denies CP but states it feels "uncomfortable." States she sits and rests and symptoms go away. There are no openings in [facility] this week. She states she has also left a message with [facility] to call her back. She is advised to head to UC/ED if unable to schedule an appt with [facility] this week.

## 2022-11-21 NOTE — Telephone Encounter (Addendum)
See OV note pt had with Dr. Ali Lowe DOD on 3/8 for full details about this matter.

## 2022-11-21 NOTE — Telephone Encounter (Signed)
  Pt is requesting to speak with [facility] regarding her cholesterol

## 2022-11-22 NOTE — Progress Notes (Signed)
Cardiology Office Note:    Date:  11/23/2022   ID:  Aimee Davis, DOB Jun 09, 1956, MRN [id number]  PCP:  Lacinda Axon, MD   [facility] Providers Cardiologist:  Lenna Sciara, MD DOD; primary cardiologist Gwyndolyn Kaufman, MD Referring MD: Lacinda Axon, MD   Chief Complaint/Reason for Referral: Palpitations  ASSESSMENT:    1. Palpitations   2. Coronary artery disease involving native coronary artery of native heart without angina pectoris   3. Hyperlipidemia LDL goal <70   4. Aortic atherosclerosis (HCC)     PLAN:    In order of problems listed above: 1.  Palpitations: Will check reflex TSH, CMP, and monitor. 2.  Coronary artery disease: Minimal on coronary CTA.  Start aspirin 81 mg; patient is intolerant of statins and had refused PCSK9 inhibitors in the past.  Previous lipid panel in January 2023 was above goal; patient tells me that she is on a cholesterol pill but she does not know which one.  It does not seem to be in our records here.  I have asked the patient to call our office to let us know what this pill is.  I believe it is Zetia. 3.  Hyperlipidemia: See #2 above. 4.  Aortic atherosclerosis: Start aspirin 81 mg and clarify cholesterol medication as above.            Dispo: 3 months.     Medication Adjustments/Labs and Tests Ordered: Current medicines are reviewed at length with the patient today.  Concerns regarding medicines are outlined above.  The following changes have been made:    Labs/tests ordered: Orders Placed This Encounter  Procedures   TSH Rfx on Abnormal to Free T4   Comprehensive metabolic panel   LONG TERM MONITOR (3-14 DAYS)   EKG 12-Lead    Medication Changes: Meds ordered this encounter  Medications   aspirin EC 81 MG tablet    Sig: Take 1 tablet (81 mg total) by mouth daily. Swallow whole.    Dispense:  90 tablet    Refill:  3     Current medicines are reviewed at length with the patient today.  The  patient does not have concerns regarding medicines.   History of Present Illness:    FOCUSED PROBLEM LIST:   1.  Minimal obstructive coronary artery disease on coronary CTA 2022 2.  GERD 3.  Hyperlipidemia; intolerant of Crestor, Lipitor, and simvastatin; declined PCSK9 inhibitors 4.  IBS 5.  Asthma and COPD 6.  Aortic atherosclerosis on CT abdomen pelvis 2023  The patient is a 67 y.o. female with the indicated medical history here for expedited DOD appointment for palpitations.  The patient had been seen in July.  At that point in time she was doing fairly well from a cardiovascular standpoint.  She was noted to be dyspneic this was thought to be secondary to asthma.  Her Zetia was continued.    The patient tells me that she has had palpitations for the last few weeks.  They can last a few seconds or last around 10 minutes.  They happen every day multiple times a day.  They are associated sometimes with some lightheadedness and some shortness of breath but no chest pain.  She has not had any syncope.  She denies any paroxysmal nocturnal dyspnea or orthopnea.  She otherwise denies any important cardiovascular symptomatology including paroxysmal nocturnal dyspnea, orthopnea or signs or symptoms of stroke.         Current Medications: Current Meds  Medication Sig   albuterol (VENTOLIN HFA) 108 (90 Base) MCG/ACT inhaler USE 2 INHALATIONS BY MOUTH EVERY 6 HOURS AS NEEDED FOR WHEEZING  OR SHORTNESS OF BREATH   Artificial Tear Ointment (DRY EYES OP) Apply 1 drop to eye daily as needed (dry eyes).   Ascorbic Acid (VITAMIN C) 100 MG tablet Take 50 mg by mouth daily.   aspirin EC 81 MG tablet Take 1 tablet (81 mg total) by mouth daily. Swallow whole.   B Complex-C (B-COMPLEX WITH VITAMIN C) tablet Take 1 tablet by mouth daily.   cholecalciferol (VITAMIN D3) 25 MCG (1000 UNIT) tablet Take 1,000 Units by mouth daily.   fexofenadine (ALLEGRA) 180 MG tablet Take 180 mg by mouth daily.    Fluticasone-Umeclidin-Vilant (TRELEGY ELLIPTA) 200-62.5-25 MCG/ACT AEPB USE 1 INHALATION BY MOUTH ONCE  DAILY   mometasone (NASONEX) 50 MCG/ACT nasal spray PLACE 2 SPRAYS IN EACH NOSTRIL ONCE DAILY AS NEEDED.   Multiple Vitamins-Minerals (MULTIVITAMIN WITH MINERALS) tablet Take 1 tablet by mouth daily.   OVER THE COUNTER MEDICATION EO MEGA Essential Oil - 2 daily   pantoprazole (PROTONIX) 40 MG tablet TAKE 1 TABLET BY MOUTH DAILY 30  TO 60 MINUTES BEFORE BREAKFAST     Allergies:    Dicyclomine, Hydrocodone, Lipitor [atorvastatin], Penicillins, and Levsin [hyoscyamine]   Social History:   Social History   Tobacco Use   Smoking status: Former    Packs/day: 0.33    Years: 43.00    Total pack years: 14.19    Types: Cigarettes    Quit date: 01/06/2013    Years since quitting: 9.8   Smokeless tobacco: Never  Vaping Use   Vaping Use: Never used  Substance Use Topics   Alcohol use: No    Alcohol/week: 0.0 standard drinks of alcohol   Drug use: Not Currently    Comment: x 26 yrs.     Family Hx: Family History  Problem Relation Age of Onset   Hypertension Mother    Heart disease Mother    Diabetes Mother    Arthritis Mother    Stroke Father    Diabetes Father    Heart disease Father    Hyperlipidemia Father    Hypertension Father    Kidney disease Father    Hypertension Sister    Breast cancer Sister    Breast cancer Sister    Hypertension Sister    Arthritis Sister    Hypertension Sister    Diabetes Sister    Mental illness Brother    Aneurysm Brother        brain   Arthritis Brother    Hypertension Maternal Aunt    Hypertension Maternal Uncle    Stomach cancer Maternal Uncle    Hypertension Paternal Aunt    Kidney disease Paternal Aunt        x 2   Hypertension Paternal Uncle    Kidney disease Paternal Uncle        x 2   Colon cancer Neg Hx    Esophageal cancer Neg Hx    Pancreatic cancer Neg Hx      Review of Systems:   Please see the history of present  illness.    All other systems reviewed and are negative.     EKGs/Labs/Other Test Reviewed:    EKG:  EKG performed July 2023 that I personally reviewed demonstrates SR with iRBBB; EKG performed today that I personally reviewed demonstrates SR with iRBBB.  Prior CV studies:  Cardiac Studies & Procedures  ECHOCARDIOGRAM  ECHOCARDIOGRAM COMPLETE 12/20/2020  Narrative ECHOCARDIOGRAM REPORT    Patient Name:   Aimee Davis Date of Exam: 12/20/2020 Medical Rec #:  [id number]       Height:       63.5 in Accession #:    [id number]      Weight:       157.0 lb Date of Birth:  [birth year]      BSA:          1.755 m Patient Age:    5 years        BP:           112/72 mmHg Patient Gender: F               HR:           66 bpm. Exam Location:  [facility]  Procedure: Cardiac Doppler, Color Doppler, 2D Echo and 3D Echo  Indications:    R07.9 Chest Pain  History:        Patient has no prior history of Echocardiogram examinations. COPD, Signs/Symptoms:Chest Pain, Shortness of Breath and Dyspnea; Risk Factors:Family History of Coronary Artery Disease, Dyslipidemia and Former Smoker. Asthma, COVID (2020, with residual Dyspnea and Chest Pain). Has been having trouble with left sided back/shoulder pain for about 3 weeks.  Sonographer:    Deliah Boston RDCS Referring Phys: [id number]   1. Left ventricular ejection fraction, by estimation, is 60 to 65%. The left ventricle has normal function. The left ventricle has no regional wall motion abnormalities. Left ventricular diastolic parameters were normal. 2. Right ventricular systolic function is normal. The right ventricular size is normal. There is normal pulmonary artery systolic pressure. 3. The mitral valve is normal in structure. Trivial mitral valve regurgitation. No evidence of mitral stenosis. 4. The aortic valve is normal in structure. Aortic valve regurgitation is not visualized. No aortic  stenosis is present. 5. The inferior vena cava is normal in size with greater than 50% respiratory variability, suggesting right atrial pressure of 3 mmHg.  FINDINGS Left Ventricle: Left ventricular ejection fraction, by estimation, is 60 to 65%. The left ventricle has normal function. The left ventricle has no regional wall motion abnormalities. The left ventricular internal cavity size was normal in size. There is no left ventricular hypertrophy. Left ventricular diastolic parameters were normal.  Right Ventricle: The right ventricular size is normal. No increase in right ventricular wall thickness. Right ventricular systolic function is normal. There is normal pulmonary artery systolic pressure. The tricuspid regurgitant velocity is 2.44 m/s, and with an assumed right atrial pressure of 3 mmHg, the estimated right ventricular systolic pressure is 0000000 mmHg.  Left Atrium: Left atrial size was normal in size.  Right Atrium: Right atrial size was normal in size.  Pericardium: There is no evidence of pericardial effusion.  Mitral Valve: The mitral valve is normal in structure. Trivial mitral valve regurgitation. No evidence of mitral valve stenosis.  Tricuspid Valve: The tricuspid valve is normal in structure. Tricuspid valve regurgitation is not demonstrated. No evidence of tricuspid stenosis.  Aortic Valve: The aortic valve is normal in structure. Aortic valve regurgitation is not visualized. No aortic stenosis is present.  Pulmonic Valve: The pulmonic valve was normal in structure. Pulmonic valve regurgitation is trivial. No evidence of pulmonic stenosis.  Aorta: The aortic root is normal in size and structure.  Venous: The inferior vena cava is normal in size with greater than 50% respiratory variability, suggesting right atrial  pressure of 3 mmHg.  IAS/Shunts: No atrial level shunt detected by color flow Doppler.   LEFT VENTRICLE PLAX 2D LVIDd:         4.20 cm  Diastology LVIDs:          2.70 cm  LV e' medial:    9.79 cm/s LV PW:         1.00 cm  LV E/e' medial:  10.9 LV IVS:        0.70 cm  LV e' lateral:   11.60 cm/s LVOT diam:     2.20 cm  LV E/e' lateral: 9.2 LV SV:         77 LV SV Index:   44 LVOT Area:     3.80 cm  3D Volume EF: 3D EF:        69 % LV EDV:       117 ml LV ESV:       37 ml LV SV:        81 ml  RIGHT VENTRICLE RV S prime:     10.60 cm/s TAPSE (M-mode): 2.0 cm  LEFT ATRIUM             Index       RIGHT ATRIUM           Index LA diam:        2.70 cm 1.54 cm/m  RA Area:     14.00 cm LA Vol (A2C):   30.3 ml 17.27 ml/m RA Volume:   35.60 ml  20.29 ml/m LA Vol (A4C):   51.7 ml 29.46 ml/m LA Biplane Vol: 43.2 ml 24.62 ml/m AORTIC VALVE LVOT Vmax:   88.35 cm/s LVOT Vmean:  53.350 cm/s LVOT VTI:    0.203 m  AORTA Ao Root diam: 3.30 cm Ao Asc diam:  3.30 cm  MITRAL VALVE                TRICUSPID VALVE MV Area (PHT): cm          TR Peak grad:   23.8 mmHg MV Decel Time: 207 msec     TR Vmax:        244.00 cm/s MV E velocity: 107.00 cm/s MV A velocity: 89.40 cm/s   SHUNTS MV E/A ratio:  1.20         Systemic VTI:  0.20 m Systemic Diam: 2.20 cm  Candee Furbish MD Electronically signed by Candee Furbish MD Signature Date/Time: 12/20/2020/2:15:40 PM    Final     CT SCANS  CT CORONARY MORPH W/CTA COR W/SCORE 12/08/2020  Addendum 12/08/2020  3:23 PM ADDENDUM REPORT: 12/08/2020 15:21  CLINICAL DATA:  67 year old female with family history of early CAD and chest pain.  EXAM: Cardiac/Coronary  CTA  TECHNIQUE: The patient was scanned on a Siemens [employer].  FINDINGS: A 100 kV prospective scan was triggered in the descending thoracic aorta at 111 HU's. Axial non-contrast 3 mm slices were carried out through the heart. The data set was analyzed on a dedicated work station and scored using the [place]. Gantry rotation speed was 250 msecs and collimation was .6 mm. 25 mg of PO Metoprolol and 0.8 mg of sl  NTG were given. The 3D data set was reconstructed in 5% intervals of the 67-82 % of the R-R cycle. Diastolic phases were analyzed on a dedicated work station using MPR, MIP and VRT modes. The patient received 80 cc of contrast.  Aorta: Normal size. Mild diffuse  atherosclerotic plaque. No dissection.  Aortic Valve: Trileaflet.  Trivial calcifications.  Coronary Arteries:  Normal coronary origin.  Right dominance.  RCA is a very large dominant artery that gives rise to PDA and PLA. There is mild calcified atherosclerotic plaque in the proximal RCA with stenosis <25%, mid and distal RVA have minimal stenoses.  PDA and PLA have no significant plaque.  Left main is a large artery that gives rise to LAD and LCX arteries. Left main has no plaque.  LAD is a large vessel that gives rise to one large diagonal artery and has no plaque.  D1 has no plaque.  LCX is a non-dominant artery that gives rise to one large OM1 branch. There is no plaque.  Other findings:  Normal pulmonary vein drainage into the left atrium.  Normal left atrial appendage without a thrombus.  Normal size of the pulmonary artery.  IMPRESSION: 1. Coronary calcium score of 90. This was 50 percentile for age and sex matched control.  2. Normal coronary origin with right dominance.  3. CAD-RADS 1. Minimal non-obstructive CAD (0-24%). Consider non-atherosclerotic causes of chest pain. Consider preventive therapy and risk factor modification.   Electronically Signed By: Ena Dawley On: 12/08/2020 15:21  Narrative EXAM: OVER-READ INTERPRETATION  CT CHEST  The following report is an over-read performed by radiologist Dr. Vinnie Langton of [facility], [place] on 12/07/2020. This over-read does not include interpretation of cardiac or coronary anatomy or pathology. The coronary calcium score/coronary CTA interpretation by the cardiologist is attached.  COMPARISON:   None.  FINDINGS: Atherosclerotic calcifications in the thoracic aorta. Within the visualized portions of the thorax there are no suspicious appearing pulmonary nodules or masses, there is no acute consolidative airspace disease, no pleural effusions, no pneumothorax and no lymphadenopathy. Visualized portions of the upper abdomen are unremarkable. There are no aggressive appearing lytic or blastic lesions noted in the visualized portions of the skeleton.  IMPRESSION: 1.  Aortic Atherosclerosis (ICD10-I70.0).  Electronically Signed: By: Vinnie Langton M.D. On: 12/07/2020 12:40          Other studies Reviewed: Review of the additional studies/records demonstrates: CT abdomen pelvis 2023 with aortic atherosclerosis  Recent Labs: 12/18/2021: TSH 1.630 09/08/2022: ALT 25; BUN 10; Creatinine, Ser 0.89; Hemoglobin 13.4; Platelets 235; Potassium 3.9; Sodium 138   Recent Lipid Panel Lab Results  Component Value Date/Time   CHOL 216 (H) 10/10/2021 09:20 AM   TRIG 83 10/10/2021 09:20 AM   HDL 94 10/10/2021 09:20 AM   LDLCALC 108 (H) 10/10/2021 09:20 AM    Risk Assessment/Calculations:                Physical Exam:    VS:  BP 102/60   Pulse 64   Ht '5\' 3"'$  (1.6 m)   Wt 151 lb 9.6 oz (68.8 kg)   SpO2 92%   BMI 26.85 kg/m    Wt Readings from Last 3 Encounters:  11/23/22 151 lb 9.6 oz (68.8 kg)  10/05/22 145 lb (65.8 kg)  08/01/22 145 lb 6.4 oz (66 kg)    GENERAL:  No apparent distress, AOx3 HEENT:  No carotid bruits, +2 carotid impulses, no scleral icterus CAR: RRR  no murmurs, gallops, rubs, or thrills RES:  Clear to auscultation bilaterally ABD:  Soft, nontender, nondistended, positive bowel sounds x 4 VASC:  +2 radial pulses, +2 carotid pulses, palpable pedal pulses NEURO:  CN 2-12 grossly intact; motor and sensory grossly intact PSYCH:  No active depression or anxiety EXT:  No edema, ecchymosis, or cyanosis  Signed, Early Osmond, MD  11/23/2022 12:21 PM     [facility] [address], [place], [place]  [address] Phone: [phone]; Fax: [phone]   Note:  This document was prepared using Dragon voice recognition software and may include unintentional dictation errors.

## 2022-11-23 ENCOUNTER — Ambulatory Visit (INDEPENDENT_AMBULATORY_CARE_PROVIDER_SITE_OTHER): Payer: 59

## 2022-11-23 ENCOUNTER — Encounter: Payer: Self-pay | Admitting: Internal Medicine

## 2022-11-23 ENCOUNTER — Ambulatory Visit: Payer: 59 | Attending: Internal Medicine | Admitting: Internal Medicine

## 2022-11-23 VITALS — BP 102/60 | HR 64 | Ht 63.0 in | Wt 151.6 lb

## 2022-11-23 DIAGNOSIS — R002 Palpitations: Secondary | ICD-10-CM

## 2022-11-23 DIAGNOSIS — I700 Atherosclerosis of aorta: Secondary | ICD-10-CM

## 2022-11-23 DIAGNOSIS — I2510 Atherosclerotic heart disease of native coronary artery without angina pectoris: Secondary | ICD-10-CM

## 2022-11-23 DIAGNOSIS — E785 Hyperlipidemia, unspecified: Secondary | ICD-10-CM | POA: Diagnosis not present

## 2022-11-23 MED ORDER — ASPIRIN 81 MG PO TBEC: 81.0000 mg | DELAYED_RELEASE_TABLET | Freq: Every day | ORAL | 3 refills | Status: DC

## 2022-11-23 NOTE — Progress Notes (Unsigned)
Enrolled patient for a 3 day Zio XT monitor to be mailed to patients home  

## 2022-11-23 NOTE — Patient Instructions (Addendum)
Medication Instructions:  Your physician has recommended you make the following change in your medication:  1-START Aspirin 81 mg by mouth daily.   *If you need a refill on your cardiac medications before your next appointment, please call your pharmacy*  Lab Work: Your physician recommends that you have lab work today- TSH, CMET  If you have labs (blood work) drawn today and your tests are completely normal, you will receive your results only by: [place] (if you have MyChart) OR A paper copy in the mail If you have any lab test that is abnormal or we need to change your treatment, we will call you to review the results.  Testing/Procedures: Your physician has recommended that you wear an 3 day-monitor. Event monitors are medical devices that record the heart's electrical activity. Doctors most often [place] these monitors to diagnose arrhythmias. Arrhythmias are problems with the speed or rhythm of the heartbeat. The monitor is a small, portable device. You can wear one while you do your normal daily activities. This is usually used to diagnose what is causing palpitations/syncope (passing out).  Follow-Up: At [facility], you and your health needs are our priority.  As part of our continuing mission to provide you with exceptional heart care, we have created designated Provider Care Teams.  These Care Teams include your primary Cardiologist (physician) and Advanced Practice Providers (APPs -  Physician Assistants and Nurse Practitioners) who all work together to provide you with the care you need, when you need it.  We recommend signing up for the patient portal called "MyChart".  Sign up information is provided on this After Visit Summary.  MyChart is used to connect with patients for Virtual Visits (Telemedicine).  Patients are able to view lab/test results, encounter notes, upcoming appointments, etc.  Non-urgent messages can be sent to your provider as well.   To learn more  about what you can do with MyChart, go to [email or web address].    Your next appointment:   3 months  Provider:   Freada Bergeron, MD     [place] Monitor Instructions  Your physician has requested you wear a ZIO patch monitor for 14 days.  This is a single patch monitor. [employer] supplies one patch monitor per enrollment. Additional stickers are not available. Please do not apply patch if you will be having a Nuclear Stress Test,  Echocardiogram, Cardiac CT, MRI, or Chest Xray during the period you would be wearing the  monitor. The patch cannot be worn during these tests. You cannot remove and re-apply the  ZIO XT patch monitor.  Your ZIO patch monitor will be mailed 3 day USPS to your address on file. It may take 3-5 days  to receive your monitor after you have been enrolled.  Once you have received your monitor, please review the enclosed instructions. Your monitor  has already been registered assigning a specific monitor serial # to you.  Billing and Patient Assistance Program Information  We have supplied [employer] with any of your insurance information on file for billing purposes. [employer] offers a sliding scale Patient Assistance Program for patients that do not have  insurance, or whose insurance does not completely cover the cost of the ZIO monitor.  You must apply for the Patient Assistance Program to qualify for this discounted rate.  To apply, please call [employer] at [phone], select option 4, select option 2, ask to apply for  Patient Assistance Program. Theodore Demark will ask your household income, and  how many people  are in your household. They will quote your out-of-pocket cost based on that information.  [employer] will also be able to set up a 72-month interest-free payment plan if needed.  Applying the monitor   Shave hair from upper left chest.  Hold abrader disc by orange tab. Rub abrader in 40 strokes over the upper left chest as  indicated in your  monitor instructions.  Clean area with 4 enclosed alcohol pads. Let dry.  Apply patch as indicated in monitor instructions. Patch will be placed under collarbone on left  side of chest with arrow pointing upward.  Rub patch adhesive wings for 2 minutes. Remove white label marked "1". Remove the white  label marked "2". Rub patch adhesive wings for 2 additional minutes.  While looking in a mirror, press and release button in center of patch. A small green light will  flash 3-4 times. This will be your only indicator that the monitor has been turned on.  Do not shower for the first 24 hours. You may shower after the first 24 hours.  Press the button if you feel a symptom. You will hear a small click. Record Date, Time and  Symptom in the Patient Logbook.  When you are ready to remove the patch, follow instructions on the last 2 pages of Patient  Logbook. Stick patch monitor onto the last page of Patient Logbook.  Place Patient Logbook in the blue and white box. Use locking tab on box and tape box closed  securely. The blue and white box has prepaid postage on it. Please place it in the mailbox as  soon as possible. Your physician should have your test results approximately 7 days after the  monitor has been mailed back to [facility]  Call [employer] [place]at [phone] you have questions regarding  your ZIO XT patch monitor. Call them immediately if you see an orange light blinking on your  monitor.  If your monitor falls off in less than 4 days, contact our Monitor department at [phone]  If your monitor becomes loose or falls off after 4 days call [employer] at [phone]  suggestions on securing your monitor

## 2022-11-24 LAB — COMPREHENSIVE METABOLIC PANEL
ALT: 26 IU/L (ref 0–32)
AST: 31 IU/L (ref 0–40)
Albumin/Globulin Ratio: 2.7 — ABNORMAL HIGH (ref 1.2–2.2)
Albumin: 4.3 g/dL (ref 3.9–4.9)
Alkaline Phosphatase: 89 IU/L (ref 44–121)
BUN/Creatinine Ratio: 19 (ref 12–28)
BUN: 16 mg/dL (ref 8–27)
Bilirubin Total: 0.4 mg/dL (ref 0.0–1.2)
CO2: 20 mmol/L (ref 20–29)
Calcium: 9.7 mg/dL (ref 8.7–10.3)
Chloride: 104 mmol/L (ref 96–106)
Creatinine, Ser: 0.85 mg/dL (ref 0.57–1.00)
Globulin, Total: 1.6 g/dL (ref 1.5–4.5)
Glucose: 98 mg/dL (ref 70–99)
Potassium: 4.4 mmol/L (ref 3.5–5.2)
Sodium: 141 mmol/L (ref 134–144)
Total Protein: 5.9 g/dL — ABNORMAL LOW (ref 6.0–8.5)
eGFR: 75 mL/min/{1.73_m2} (ref >59)

## 2022-11-24 LAB — TSH RFX ON ABNORMAL TO FREE T4: TSH: 1.07 u[IU]/mL (ref 0.450–4.500)

## 2022-11-27 DIAGNOSIS — I2510 Atherosclerotic heart disease of native coronary artery without angina pectoris: Secondary | ICD-10-CM | POA: Diagnosis not present

## 2022-11-27 DIAGNOSIS — I700 Atherosclerosis of aorta: Secondary | ICD-10-CM

## 2022-11-27 DIAGNOSIS — R002 Palpitations: Secondary | ICD-10-CM

## 2022-11-27 DIAGNOSIS — E785 Hyperlipidemia, unspecified: Secondary | ICD-10-CM

## 2022-11-28 ENCOUNTER — Ambulatory Visit: Payer: 59 | Admitting: Gastroenterology

## 2022-12-07 ENCOUNTER — Telehealth: Payer: Self-pay | Admitting: Internal Medicine

## 2022-12-07 NOTE — Telephone Encounter (Signed)
Pt called back per message received in [place] Triage.    Pt made aware of Zio heart monitor results per Dr. Ali Lowe, and recent TSH, CMP results.    Pt educated on results. Pt stated she also takes ezetimibe 10 mg tablet once daily.  This med was added to her MAR per Pt request.  Pt did not have any further concerns or questions;  Pt will see Dr. Johney Frame in July.  No additional follow up required at this time.

## 2022-12-07 NOTE — Telephone Encounter (Signed)
Patient is calling for her lab and monitor results.  She states she can't get into her MyChart.

## 2022-12-07 NOTE — Telephone Encounter (Signed)
Pt states she was on a call with nurse and the call was dropped. She would like a callback. Please advise

## 2022-12-18 ENCOUNTER — Other Ambulatory Visit: Payer: Self-pay | Admitting: Student

## 2022-12-19 NOTE — Telephone Encounter (Signed)
Next appt scheduled 4/8 with PCP. 

## 2022-12-21 ENCOUNTER — Telehealth: Payer: Self-pay | Admitting: *Deleted

## 2022-12-21 ENCOUNTER — Telehealth: Payer: Self-pay | Admitting: Cardiology

## 2022-12-21 NOTE — Telephone Encounter (Signed)
Patient called because for the past several days she has been having an increase in SOB.  It is like it was while she wore the monitor but all the time now.   She is now trying her rescue inhaler for relief but it does not help.  A home nurse came to see her yesterday.  Checked VS but pt does not know them.    She denies chest pain, no dizziness.  She has been scheduled with APP on Monday 4/8.  We talked about going to the ER if symptoms worsen or change.  Pt voices understanding.    She asked if Dr. Shari Prows could call her.  I adv that I would send her the message but that she is busy in clinic today any may not be able to do so.  She understands.  Just wants to know what could be wrong.

## 2022-12-21 NOTE — Telephone Encounter (Signed)
Called pt per front office request . Pt stated she is not feeling as well. C/o nausea, no energy. Stated she has been doing good until last couple of days; she feels like "just lying down". Stated she toke med for her stomach. Pt has an appt on Monday 4/8 with her PCP. Advised pt to keep her appt but if she starts to feel worse to go to an UC - stated she will.

## 2022-12-21 NOTE — Telephone Encounter (Signed)
Agree, thank you

## 2022-12-21 NOTE — Telephone Encounter (Signed)
Pt c/o Shortness Of Breath: STAT if SOB developed within the last 24 hours or pt is noticeably SOB on the phone  1. Are you currently SOB (can you hear that pt is SOB on the phone)? yes  2. How long have you been experiencing SOB? Over a monthh  3. Are you SOB when sitting or when up moving around? both  4. Are you currently experiencing any other symptoms? Irregular heart beat

## 2022-12-24 ENCOUNTER — Encounter (HOSPITAL_BASED_OUTPATIENT_CLINIC_OR_DEPARTMENT_OTHER): Payer: Self-pay | Admitting: Cardiology

## 2022-12-24 ENCOUNTER — Ambulatory Visit
Admission: RE | Admit: 2022-12-24 | Discharge: 2022-12-24 | Disposition: A | Discharge status: Home / Self Care | Payer: 59 | Source: Ambulatory Visit | Attending: Family Medicine | Admitting: Family Medicine

## 2022-12-24 ENCOUNTER — Ambulatory Visit: Payer: 59

## 2022-12-24 ENCOUNTER — Ambulatory Visit (INDEPENDENT_AMBULATORY_CARE_PROVIDER_SITE_OTHER): Payer: 59 | Admitting: Student

## 2022-12-24 ENCOUNTER — Encounter: Payer: Self-pay | Admitting: Student

## 2022-12-24 ENCOUNTER — Ambulatory Visit (INDEPENDENT_AMBULATORY_CARE_PROVIDER_SITE_OTHER): Payer: 59 | Admitting: Cardiology

## 2022-12-24 VITALS — BP 129/62 | HR 91 | Wt 151.5 lb

## 2022-12-24 VITALS — BP 126/71 | HR 87 | Ht 63.0 in | Wt 152.0 lb

## 2022-12-24 DIAGNOSIS — E785 Hyperlipidemia, unspecified: Secondary | ICD-10-CM | POA: Diagnosis not present

## 2022-12-24 DIAGNOSIS — T466X5A Adverse effect of antihyperlipidemic and antiarteriosclerotic drugs, initial encounter: Secondary | ICD-10-CM | POA: Diagnosis not present

## 2022-12-24 DIAGNOSIS — G2581 Restless legs syndrome: Secondary | ICD-10-CM

## 2022-12-24 DIAGNOSIS — I2510 Atherosclerotic heart disease of native coronary artery without angina pectoris: Secondary | ICD-10-CM

## 2022-12-24 DIAGNOSIS — Z0000 Encounter for general adult medical examination without abnormal findings: Secondary | ICD-10-CM

## 2022-12-24 DIAGNOSIS — E7800 Pure hypercholesterolemia, unspecified: Secondary | ICD-10-CM | POA: Diagnosis not present

## 2022-12-24 DIAGNOSIS — R002 Palpitations: Secondary | ICD-10-CM

## 2022-12-24 DIAGNOSIS — K582 Mixed irritable bowel syndrome: Secondary | ICD-10-CM

## 2022-12-24 DIAGNOSIS — J439 Emphysema, unspecified: Secondary | ICD-10-CM

## 2022-12-24 DIAGNOSIS — Z1231 Encounter for screening mammogram for malignant neoplasm of breast: Secondary | ICD-10-CM

## 2022-12-24 DIAGNOSIS — G720 Drug-induced myopathy: Secondary | ICD-10-CM | POA: Diagnosis not present

## 2022-12-24 DIAGNOSIS — E559 Vitamin D deficiency, unspecified: Secondary | ICD-10-CM

## 2022-12-24 DIAGNOSIS — K589 Irritable bowel syndrome without diarrhea: Secondary | ICD-10-CM

## 2022-12-24 MED ORDER — METOPROLOL TARTRATE 25 MG PO TABS: 25.0000 mg | ORAL_TABLET | ORAL | 3 refills | Status: DC | PRN

## 2022-12-24 MED ORDER — FEXOFENADINE HCL 180 MG PO TABS: 180.0000 mg | ORAL_TABLET | Freq: Every day | ORAL | 1 refills | Status: AC

## 2022-12-24 MED ORDER — ZOSTER VAC RECOMB ADJUVANTED 50 MCG/0.5ML IM SUSR: 0.5000 mL | Freq: Once | INTRAMUSCULAR | 0 refills | Status: DC

## 2022-12-24 MED ORDER — PANTOPRAZOLE SODIUM 40 MG PO TBEC: DELAYED_RELEASE_TABLET | ORAL | 3 refills | Status: DC

## 2022-12-24 NOTE — Assessment & Plan Note (Signed)
Adherent to her inhalers.  Reports occasional shortness of breath and cough but denies any wheezing.  Lungs are clear to auscultation bilaterally with no wheezes today.  Plan: -Continue Trelegy and as needed Ventolin

## 2022-12-24 NOTE — Assessment & Plan Note (Signed)
Patient unable to tolerate Crestor due to significant leg pains after initiating therapy for her hypercholesterolemia. Currently on Zetia.

## 2022-12-24 NOTE — Assessment & Plan Note (Addendum)
Patient has a history of statin induced myopathy so currently on Zetia for her hypercholesterolemia.  Last lipid panel in January 2023 showed LDL 108.  Plan: -Repeat lipid panel -Continue Zetia 10 mg daily -Continue lifestyle modifications with dietary changes and exercise

## 2022-12-24 NOTE — Progress Notes (Signed)
CC: Follow-up  HPI:  Aimee Davis is a 67 y.o. female with PMH as below who presents to clinic to follow-up on her chronic medical problems. Please see problem based charting for evaluation, assessment and plan.  Past Medical History:  Diagnosis Date   Abdominal pain 08/16/2008   Qualifier: Diagnosis of  By: Daphine Deutscher FNP, [name]     Abdominal pain, epigastric 09/08/2019   Allergy    seasonal, PCN, antidepressants, bentyl   Aortic atherosclerosis (HCC) 02/23/2021   Arthritis    Asthma    Asymptomatic bacteriuria 08/24/2021   UA showed leukocytes but patient does not have dysuria, fevers or hematuria. She also had bacteruria in her last UA. Her symptoms of fatigue, and loss of appetite are non-specific but if persist or she begins having dysuria we can work up further. A UTI would not explain the abdominal pain patient is having.    Carpal tunnel syndrome on both sides    right worse than left 2008   Cataract 06/11/2013   COPD (chronic obstructive pulmonary disease) (HCC)    COVID-19 virus infection 06/18/2016   Eczema 05/02/2018   Food poisoning 04/04/2021   GERD (gastroesophageal reflux disease)    Hair loss 07/07/2019   Hyperlipidemia    LDL 108 JULY 2013   IBS (irritable bowel syndrome)    Macular rash 05/20/2019   Multiple nevi 03/01/2015   Pneumonia    Positive TB test    From [facility] 2009 note : Recent F/u at [facility]. CXRAy negative.  Positive TST in 2007 (37mm).  Unable to tolerate INH due to hepatotoxicity   Primary insomnia 06/05/2012   Skin macule 04/21/2019   Tinnitus 07/07/2019   URI (upper respiratory infection) 11/01/2010   Qualifier: Diagnosis of  By: Daphine Deutscher FNP, Zena Amos      Review of Systems:  Constitutional: Negative for fever or fatigue HEENT: Positive for occasional cough Eyes: Negative for visual changes Respiratory: Positive for occasional shortness of breath. Cardiac: Negative for chest pain. Positive for occasional palpitations MSK: Negative for back  pain Abdomen: Positive for chronic abdominal discomfort, diarrhea and constipation Neuro: Negative for headache or weakness  Physical Exam: General: Pleasant, well-appearing elderly woman. No acute distress. HEENT: Anicteric sclerae.  MMM.  No tonsillar exudate, buccal lesions or erythema.  Cardiac: Mild tachycardia. Regular rhythm. No murmurs, rubs or gallops.  Trace BLE edema. Respiratory: Lungs CTAB. No wheezing or crackles.  Mildly decreased breath sounds at the bases. Abdominal: Soft, symmetric.  Mild generalized tenderness.  No organomegaly.  Normal BS. Skin: Warm, dry and intact without rashes or lesions Extremities: Atraumatic. Full ROM. Palpable radial and DP pulses. Neuro: A&O x 3. Moves all extremities.  Normal sensation to gross touch. Psych: Appropriate mood and affect.  Vitals:   12/24/22 1011  BP: 129/62  Pulse: 91  SpO2: 98%  Weight: 151 lb 8 oz (68.7 kg)    Assessment & Plan:   Pure hypercholesterolemia Patient has a history of statin induced myopathy so currently on Zetia for her hypercholesterolemia.  Last lipid panel in January 2023 showed LDL 108.  Plan: -Repeat lipid panel -Continue Zetia 10 mg daily -Continue lifestyle modifications with dietary changes and exercise  Irritable bowel syndrome Patient's follows with GI closely.  She continues to have mild abdominal discomfort with intermittent diarrhea and constipation. She has not tolerated multiple IBS medications in the past and currently taking IBgard and probiotics. States she has a recent flare that was likely triggered by eating pizza. Patient interested  in seeing a nutritionist for nutrition counseling and close monitoring of her dietary habits  Plan: -Follow-up with GI as needed -Continue IBgard and probiotics -Continue to follow FODMAP diet -Referral to medical nutrition counseling  Emphysema lung (HCC) Adherent to her inhalers.  Reports occasional shortness of breath and cough but denies any  wheezing.  Lungs are clear to auscultation bilaterally with no wheezes today.  Plan: -Continue Trelegy and as needed Ventolin  Vitamin D deficiency Labs vitamin D levels in August 2022 low at 21.1.  Currently on vitamin D 1000 units daily -Repeat vitamin D levels  Restless leg syndrome Relatively stable. -Check ferritin  Healthcare maintenance Printed out prescription for Shingrix for patient to get at her local pharmacy.  Mammogram scheduled for later this morning.  Statin myopathy Patient unable to tolerate Crestor due to significant leg pains after initiating therapy for her hypercholesterolemia. Currently on Zetia.    See Encounters Tab for problem based charting.  Patient discussed with Dr. Lewie Chamber, MD, MPH

## 2022-12-24 NOTE — Patient Instructions (Signed)
Medication Instructions:  START METOPROLOL 25 MG AS NEEDED FOR PALPITATIONS   Labwork: YOUR LDL CHOLESTEROL WAS 108 JANUARY 2023  Testing/Procedures: NONE  Follow-Up: KEEP WITH DR Shari Prows AS SCHEDULED   Any Other Special Instructions Will Be Listed Below (If Applicable). THE EXTRA HEART BEATS YOU ARE HAVING ARE CALLED PAC'S (Premature Atrial Contractions)   Premature Atrial Contraction  A premature atrial contraction [facility]) is a kind of irregular heartbeat (arrhythmia). The heart has four chambers, including the upper chambers (atria) and lower chambers (ventricles). Normally, an electrical signal starts in a group of cells called the sinoatrial node (SA node) and travels through the atria, causing them to pump blood into the ventricles. During a PAC, the atria beat too early, before they have had time to fill with blood. The heartbeat pauses afterward so the heart can fill with blood for the next beat. Sometimes PAC can be a warning sign of another type of arrhythmia called atrial fibrillation. Atrial fibrillation may allow blood to pool in the atria and form clots. If a clot travels to the brain, it can cause a stroke. What are the causes? The cause of this condition is often unknown. Sometimes, this condition may be caused by heart disease or injury to the heart. What increases the risk? You are more likely to develop this condition if: You have heart disease. You are 69 years of age or older. Episodes may be triggered by: Tobacco, alcohol, or caffeine use. Stimulant drugs. Some medicines or supplements. Stress and anxiety. What are the signs or symptoms? Symptoms of this condition include: The feeling of a pause in the heartbeat. The first heartbeat after the "skipped" beat may feel more forceful. A feeling that your heart is fluttering. A quick feeling of dizziness or faintness. How is this diagnosed? This condition is diagnosed based on: Your medical history or  symptoms. A physical exam. Your health care provider may listen to your heart. An ECG (electrocardiogram) to monitor the electrical activity of your heart. An ambulatory cardiac monitor that records your heartbeats for 24 hours or more. You may also have: Blood tests. An echocardiogram, which creates an image of your heart. How is this treated? Treatment depends on how often your symptoms happen and other risk factors. Treatments may include: Medicines. A catheter ablation procedure to destroy the part of the heart tissue that sends abnormal signals. In many cases, treatment may not be needed. Follow these instructions at home: Lifestyle  Do not use any products that contain nicotine or tobacco. These products include cigarettes, chewing tobacco, and vaping devices, such as e-cigarettes. If you need help quitting, ask your health care provider. Exercise regularly. Ask your health care provider what type of exercise is safe for you. Try to get at least 7-9 hours of sleep each night. Find healthy ways to manage stress. Avoid stressful situations when possible. Alcohol use Do not drink alcohol if: Your health care provider tells you not to drink. You are pregnant, may be pregnant, or are planning to become pregnant. Alcohol triggers your episodes. If you drink alcohol: Limit how much you have to: 0-1 drink a day for women. 0-2 drinks a day for men. Know how much alcohol is in your drink. In the [place] one drink equals one 12 oz bottle of beer (355 mL), one 5 oz glass of wine (148 mL), or one 1 oz glass of hard liquor (44 mL). General instructions Take over-the-counter and prescription medicines only as told by your health care provider.  If caffeine triggers episodes, do not eat, drink, or use anything with caffeine in it. Contact a health care provider if: You feel your heart is fluttering. Your heart skips beats and you feel dizzy, light-headed, or very tired. Get help right away  if: You have chest pain. You have trouble breathing. You faint. You have any symptoms of a stroke. "BE FAST" is an easy way to remember the main warning signs of a stroke: B - Balance. Signs are dizziness, sudden trouble walking, or loss of balance. E - Eyes. Signs are trouble seeing or a sudden change in vision. F - Face. Signs are sudden weakness or numbness of the face, or the face or eyelid drooping on one side. A - Arms. Signs are weakness or numbness in an arm. This happens suddenly and usually on one side of the body. S - Speech. Signs are sudden trouble speaking, slurred speech, or trouble understanding what people say. T - Time. Time to call emergency services. Write down what time symptoms started. You have other signs of stroke, such as: A sudden, severe headache with no known cause. Nausea or vomiting. Seizure. These symptoms may be an emergency. Get help right away. Call 911. Do not wait to see if the symptoms will go away. Do not drive yourself to the hospital. This information is not intended to replace advice given to you by your health care provider. Make sure you discuss any questions you have with your health care provider. Document Revised: 02/02/2022 Document Reviewed: 02/02/2022 Elsevier Patient Education  2023 [employer].

## 2022-12-24 NOTE — Progress Notes (Signed)
Cardiology Office Note:    Date:  12/24/2022   ID:  Aimee GEHLING, DOB [birth year], MRN [id number]  PCP:  Aimee Rainwater, MD   [facility] Providers Cardiologist:  Aimee Sprague, MD     Referring MD: Aimee Rainwater, MD   CC: follow up for palpitations  History of Present Illness:    Aimee Davis is a 67 y.o. female with a hx of CAD, emphysema, GERD, IBS, pancreatic lesion, OAB, chronic fatigue, major depressive disorder, HLD, anxiety.  She establish care with Dr. Shari Davis in March 2022 for further evaluation of DOE and exertional chest pain.  She underwent a coronary CTA at this time that showed minimal disease in the RCA.  Calcium score was 90, placing her in the 83rd percentile for age and sex matched control.  Echocardiogram at this time revealed an EF of 60 to 65%, no significant valvular disease.  It was determined that her symptoms were noncardiac in nature.  She was evaluated by Dr. Lynnette Davis on 11/23/2022 for concerns of palpitations, lab work was unrevealing for causes.  She wore a ZIO monitor which showed predominant underlying rhythm was sinus, with average heart rate of 82 bpm, 2 runs of SVT noted with the fastest lasting 8 beats at a max of 154 bpm.  She presents today for follow-up of her palpitations.  We reviewed her recent ZIO monitor results, she continues to notice palpitations that she cannot attribute it to any contributing factors.  She avoids caffeine, tobacco, NSAIDs, stress.  She previously had been working out however was concerned about working out with her palpitations.  Encouraged her that her palpitations are not worrisome and to continue with her previous exercise regimen. She reports pedal edema that occurs that subsides overnight. She denies chest pain, dyspnea, pnd, orthopnea, n, v, dizziness, syncope, edema, weight gain, or early satiety.   Past Medical History:  Diagnosis Date   Abdominal pain 08/16/2008   Qualifier: Diagnosis  of  By: Aimee Deutscher FNP, [name]     Abdominal pain, epigastric 09/08/2019   Allergy    seasonal, PCN, antidepressants, bentyl   Aortic atherosclerosis 02/23/2021   Arthritis    Asthma    Asymptomatic bacteriuria 08/24/2021   UA showed leukocytes but patient does not have dysuria, fevers or hematuria. She also had bacteruria in her last UA. Her symptoms of fatigue, and loss of appetite are non-specific but if persist or she begins having dysuria we can work up further. A UTI would not explain the abdominal pain patient is having.    Carpal tunnel syndrome on both sides    right worse than left 2008   Cataract 06/11/2013   COPD (chronic obstructive pulmonary disease)    COVID-19 virus infection 06/18/2016   Eczema 05/02/2018   Food poisoning 04/04/2021   GERD (gastroesophageal reflux disease)    Hair loss 07/07/2019   Hyperlipidemia    LDL 108 JULY 2013   IBS (irritable bowel syndrome)    Macular rash 05/20/2019   Multiple nevi 03/01/2015   Pneumonia    Positive TB test    From [facility] 2009 note : Recent F/u at [facility]. CXRAy negative.  Positive TST in 2007 (14mm).  Unable to tolerate INH due to hepatotoxicity   Primary insomnia 06/05/2012   Skin macule 04/21/2019   Tinnitus 07/07/2019   URI (upper respiratory infection) 11/01/2010   Qualifier: Diagnosis of  By: Aimee Deutscher FNP, Aimee Davis      Past Surgical History:  Procedure Laterality  Date   BREAST CYST EXCISION Right 2011   BREAST EXCISIONAL BIOPSY     benign   BREAST EXCISIONAL BIOPSY Right 1990   benign   INDUCED ABORTION  2005   VAGINAL HYSTERECTOMY  1983   partial; for abnormal bleeding    Current Medications: Current Meds  Medication Sig   albuterol (VENTOLIN HFA) 108 (90 Base) MCG/ACT inhaler USE 2 INHALATIONS BY MOUTH EVERY 6 HOURS AS NEEDED FOR WHEEZING  OR SHORTNESS OF BREATH   Artificial Tear Ointment (DRY EYES OP) Apply 1 drop to eye daily as needed (dry eyes).   Ascorbic Acid (VITAMIN C) 100 MG tablet Take 50 mg by mouth daily.    aspirin EC 81 MG tablet Take 1 tablet (81 mg total) by mouth daily. Swallow whole.   B Complex-C (B-COMPLEX WITH VITAMIN C) tablet Take 1 tablet by mouth daily.   cholecalciferol (VITAMIN D3) 25 MCG (1000 UNIT) tablet Take 1,000 Units by mouth daily.   ezetimibe (ZETIA) 10 MG tablet Take 10 mg by mouth daily.   fexofenadine (ALLEGRA) 180 MG tablet Take 1 tablet (180 mg total) by mouth daily.   Fluticasone-Umeclidin-Vilant (TRELEGY ELLIPTA) 200-62.5-25 MCG/ACT AEPB USE 1 INHALATION BY MOUTH ONCE  DAILY   metoprolol tartrate (LOPRESSOR) 25 MG tablet Take 1 tablet (25 mg total) by mouth as needed (PALPITATIONS). Take 1 tablet (25 mg) TWO hours prior to CT scan   mometasone (NASONEX) 50 MCG/ACT nasal spray PLACE 2 SPRAYS IN EACH NOSTRIL ONCE DAILY AS NEEDED.   Multiple Vitamins-Minerals (MULTIVITAMIN WITH MINERALS) tablet Take 1 tablet by mouth daily.   OVER THE COUNTER MEDICATION EO MEGA Essential Oil - 2 daily   pantoprazole (PROTONIX) 40 MG tablet TAKE 1 TABLET BY MOUTH DAILY 30  TO 60 MINUTES BEFORE BREAKFAST     Allergies:   Dicyclomine, Hydrocodone, Lipitor [atorvastatin], Penicillins, and Levsin [hyoscyamine]   Social History   Socioeconomic History   Marital status: Divorced    Spouse name: Not on file   Number of children: 2   Years of education: Not on file   Highest education level: Not on file  Occupational History   Occupation: retired  Tobacco Use   Smoking status: Former    Packs/day: 0.33    Years: 43.00    Additional pack years: 0.00    Total pack years: 14.19    Types: Cigarettes    Quit date: 01/06/2013    Years since quitting: 9.9   Smokeless tobacco: Never  Vaping Use   Vaping Use: Never used  Substance and Sexual Activity   Alcohol use: No    Alcohol/week: 0.0 standard drinks of alcohol   Drug use: Not Currently    Comment: x 26 yrs.   Sexual activity: Not on file    Comment: S/P PARTIAL HYSTERECTOMY  Other Topics Concern   Not on file  Social History  Narrative   Not on file   Social Determinants of Health   Financial Resource Strain: Not on file  Food Insecurity: Not on file  Transportation Needs: Not on file  Physical Activity: Not on file  Stress: Not on file  Social Connections: Not on file     Family History: The patient's family history includes Aneurysm in her brother; Arthritis in her brother, mother, and sister; Breast cancer in her sister and sister; Diabetes in her father, mother, and sister; Heart disease in her father and mother; Hyperlipidemia in her father; Hypertension in her father, maternal aunt, maternal uncle, mother, paternal  aunt, paternal uncle, sister, sister, and sister; Kidney disease in her father, paternal aunt, and paternal uncle; Mental illness in her brother; Stomach cancer in her maternal uncle; Stroke in her father. There is no history of Colon cancer, Esophageal cancer, or Pancreatic cancer.  ROS:   Please see the history of present illness.    All other systems reviewed and are negative.  EKGs/Labs/Other Studies Reviewed:    The following studies were reviewed today:  Cardiac Studies & Procedures       ECHOCARDIOGRAM  ECHOCARDIOGRAM COMPLETE 12/20/2020  Narrative ECHOCARDIOGRAM REPORT    Patient Name:   BONNEE ESTELA Date of Exam: 12/20/2020 Medical Rec #:  [id number]       Height:       63.5 in Accession #:    [id number]      Weight:       157.0 lb Date of Birth:  29-Jun-1956      BSA:          1.755 m Patient Age:    64 years        BP:           112/72 mmHg Patient Gender: F               HR:           66 bpm. Exam Location:  [facility]  Procedure: Cardiac Doppler, Color Doppler, 2D Echo and 3D Echo  Indications:    R07.9 Chest Pain  History:        Patient has no prior history of Echocardiogram examinations. COPD, Signs/Symptoms:Chest Pain, Shortness of Breath and Dyspnea; Risk Factors:Family History of Coronary Artery Disease, Dyslipidemia and Former Smoker. Asthma, COVID  (2020, with residual Dyspnea and Chest Pain). Has been having trouble with left sided back/shoulder pain for about 3 weeks.  Sonographer:    Farrel Conners RDCS Referring Phys: [id number] HEATHER E PEMBERTON  IMPRESSIONS   1. Left ventricular ejection fraction, by estimation, is 60 to 65%. The left ventricle has normal function. The left ventricle has no regional wall motion abnormalities. Left ventricular diastolic parameters were normal. 2. Right ventricular systolic function is normal. The right ventricular size is normal. There is normal pulmonary artery systolic pressure. 3. The mitral valve is normal in structure. Trivial mitral valve regurgitation. No evidence of mitral stenosis. 4. The aortic valve is normal in structure. Aortic valve regurgitation is not visualized. No aortic stenosis is present. 5. The inferior vena cava is normal in size with greater than 50% respiratory variability, suggesting right atrial pressure of 3 mmHg.  FINDINGS Left Ventricle: Left ventricular ejection fraction, by estimation, is 60 to 65%. The left ventricle has normal function. The left ventricle has no regional wall motion abnormalities. The left ventricular internal cavity size was normal in size. There is no left ventricular hypertrophy. Left ventricular diastolic parameters were normal.  Right Ventricle: The right ventricular size is normal. No increase in right ventricular wall thickness. Right ventricular systolic function is normal. There is normal pulmonary artery systolic pressure. The tricuspid regurgitant velocity is 2.44 m/s, and with an assumed right atrial pressure of 3 mmHg, the estimated right ventricular systolic pressure is 26.8 mmHg.  Left Atrium: Left atrial size was normal in size.  Right Atrium: Right atrial size was normal in size.  Pericardium: There is no evidence of pericardial effusion.  Mitral Valve: The mitral valve is normal in structure. Trivial mitral valve  regurgitation. No evidence of mitral valve stenosis.  Tricuspid  Valve: The tricuspid valve is normal in structure. Tricuspid valve regurgitation is not demonstrated. No evidence of tricuspid stenosis.  Aortic Valve: The aortic valve is normal in structure. Aortic valve regurgitation is not visualized. No aortic stenosis is present.  Pulmonic Valve: The pulmonic valve was normal in structure. Pulmonic valve regurgitation is trivial. No evidence of pulmonic stenosis.  Aorta: The aortic root is normal in size and structure.  Venous: The inferior vena cava is normal in size with greater than 50% respiratory variability, suggesting right atrial pressure of 3 mmHg.  IAS/Shunts: No atrial level shunt detected by color flow Doppler.   LEFT VENTRICLE PLAX 2D LVIDd:         4.20 cm  Diastology LVIDs:         2.70 cm  LV e' medial:    9.79 cm/s LV PW:         1.00 cm  LV E/e' medial:  10.9 LV IVS:        0.70 cm  LV e' lateral:   11.60 cm/s LVOT diam:     2.20 cm  LV E/e' lateral: 9.2 LV SV:         77 LV SV Index:   44 LVOT Area:     3.80 cm  3D Volume EF: 3D EF:        69 % LV EDV:       117 ml LV ESV:       37 ml LV SV:        81 ml  RIGHT VENTRICLE RV S prime:     10.60 cm/s TAPSE (M-mode): 2.0 cm  LEFT ATRIUM             Index       RIGHT ATRIUM           Index LA diam:        2.70 cm 1.54 cm/m  RA Area:     14.00 cm LA Vol (A2C):   30.3 ml 17.27 ml/m RA Volume:   35.60 ml  20.29 ml/m LA Vol (A4C):   51.7 ml 29.46 ml/m LA Biplane Vol: 43.2 ml 24.62 ml/m AORTIC VALVE LVOT Vmax:   88.35 cm/s LVOT Vmean:  53.350 cm/s LVOT VTI:    0.203 m  AORTA Ao Root diam: 3.30 cm Ao Asc diam:  3.30 cm  MITRAL VALVE                TRICUSPID VALVE MV Area (PHT): cm          TR Peak grad:   23.8 mmHg MV Decel Time: 207 msec     TR Vmax:        244.00 cm/s MV E velocity: 107.00 cm/s MV A velocity: 89.40 cm/s   SHUNTS MV E/A ratio:  1.20         Systemic VTI:  0.20 m Systemic  Diam: 2.20 cm  Donato Schultz MD Electronically signed by Donato Schultz MD Signature Date/Time: 12/20/2020/2:15:40 PM    Final    MONITORS  LONG TERM MONITOR (3-14 DAYS) 12/05/2022  Narrative Patch Wear Time:  2 days and 21 hours (2024-03-12T11:20:45-0400 to 2024-03-15T09:07:02-399)  Patient had a min HR of 51 bpm, max HR of 154 bpm, and avg HR of 82 bpm. Predominant underlying rhythm was Sinus Rhythm.  EVENTS: -2 Supraventricular Tachycardia runs occurred, the run with the fastest interval lasting 8 beats with a max rate of 154 bpm (avg 137 bpm); the run with the  fastest interval was also the longest.  -Isolated SVEs were occasional (1.2%, 3496), SVE Couplets were rare (<1.0%, 1), and SVE Triplets were rare (<1.0%, 1). Isolated VEs were rare (<1.0%), and no VE Couplets or VE Triplets were present.  No atrial fibrillation, sustained ventricular tachyarrhythmias, or bradyarrhythmias were detected.  Patient-triggered events corresponded with sinus rhythm and PACs.   CT SCANS  CT CORONARY MORPH W/CTA COR W/SCORE 12/08/2020  Addendum 12/08/2020  3:23 PM ADDENDUM REPORT: 12/08/2020 15:21  CLINICAL DATA:  67 year old female with family history of early CAD and chest pain.  EXAM: Cardiac/Coronary  CTA  TECHNIQUE: The patient was scanned on a Siemens [employer].  FINDINGS: A 100 kV prospective scan was triggered in the descending thoracic aorta at 111 HU's. Axial non-contrast 3 mm slices were carried out through the heart. The data set was analyzed on a dedicated work station and scored using the Agatson method. Gantry rotation speed was 250 msecs and collimation was .6 mm. 25 mg of PO Metoprolol and 0.8 mg of sl NTG were given. The 3D data set was reconstructed in 5% intervals of the 67-82 % of the R-R cycle. Diastolic phases were analyzed on a dedicated work station using MPR, MIP and VRT modes. The patient received 80 cc of contrast.  Aorta: Normal size. Mild  diffuse atherosclerotic plaque. No dissection.  Aortic Valve: Trileaflet.  Trivial calcifications.  Coronary Arteries:  Normal coronary origin.  Right dominance.  RCA is a very large dominant artery that gives rise to PDA and PLA. There is mild calcified atherosclerotic plaque in the proximal RCA with stenosis <25%, mid and distal RVA have minimal stenoses.  PDA and PLA have no significant plaque.  Left main is a large artery that gives rise to LAD and LCX arteries. Left main has no plaque.  LAD is a large vessel that gives rise to one large diagonal artery and has no plaque.  D1 has no plaque.  LCX is a non-dominant artery that gives rise to one large OM1 branch. There is no plaque.  Other findings:  Normal pulmonary vein drainage into the left atrium.  Normal left atrial appendage without a thrombus.  Normal size of the pulmonary artery.  IMPRESSION: 1. Coronary calcium score of 90. This was 18 percentile for age and sex matched control.  2. Normal coronary origin with right dominance.  3. CAD-RADS 1. Minimal non-obstructive CAD (0-24%). Consider non-atherosclerotic causes of chest pain. Consider preventive therapy and risk factor modification.   Electronically Signed By: Tobias Alexander On: 12/08/2020 15:21  Narrative EXAM: OVER-READ INTERPRETATION  CT CHEST  The following report is an over-read performed by radiologist Dr. Trudie Reed of [facility] on 12/07/2020. This over-read does not include interpretation of cardiac or coronary anatomy or pathology. The coronary calcium score/coronary CTA interpretation by the cardiologist is attached.  COMPARISON:  None.  FINDINGS: Atherosclerotic calcifications in the thoracic aorta. Within the visualized portions of the thorax there are no suspicious appearing pulmonary nodules or masses, there is no acute consolidative airspace disease, no pleural effusions, no pneumothorax and  no lymphadenopathy. Visualized portions of the upper abdomen are unremarkable. There are no aggressive appearing lytic or blastic lesions noted in the visualized portions of the skeleton.  IMPRESSION: 1.  Aortic Atherosclerosis (ICD10-I70.0).  Electronically Signed: By: Trudie Reed M.D. On: 12/07/2020 12:40           EKG:  EKG is not ordered today.    Recent Labs: 09/08/2022:  Hemoglobin 13.4; Platelets 235 11/23/2022: ALT 26; BUN 16; Creatinine, Ser 0.85; Potassium 4.4; Sodium 141; TSH 1.070  Recent Lipid Panel    Component Value Date/Time   CHOL 216 (H) 10/10/2021 0920   TRIG 83 10/10/2021 0920   HDL 94 10/10/2021 0920   CHOLHDL 2.3 10/10/2021 0920   CHOLHDL 2.2 04/14/2015 1642   VLDL 52 (H) 04/14/2015 1642   LDLCALC 108 (H) 10/10/2021 0920     Risk Assessment/Calculations:                Physical Exam:    VS:  BP 126/71   Pulse 87   Ht 5\' 3"  (1.6 m)   Wt 152 lb (68.9 kg)   BMI 26.93 kg/m     Wt Readings from Last 3 Encounters:  12/24/22 152 lb (68.9 kg)  12/24/22 151 lb 8 oz (68.7 kg)  11/23/22 151 lb 9.6 oz (68.8 kg)     GEN:  Well nourished, well developed in no acute distress HEENT: Normal NECK: No JVD; No carotid bruits LYMPHATICS: No lymphadenopathy CARDIAC: RRR, no murmurs, rubs, gallops RESPIRATORY:  Clear to auscultation without rales, wheezing or rhonchi  ABDOMEN: Soft, non-tender, non-distended MUSCULOSKELETAL:  No edema appreciated; No deformity  SKIN: Warm and dry NEUROLOGIC:  Alert and oriented x 3 PSYCHIATRIC:  Normal affect   ASSESSMENT:    1. Palpitations   2. Hyperlipidemia LDL goal <70   3. Coronary artery disease involving native coronary artery of native heart without angina pectoris    PLAN:    In order of problems listed above:  Palpitations - ZIO monitor showed predominant NSR with 2 runs of SVT, reports they are bothersome to her. Will start metoprolol tartrate 25 mg to take as needed (she prefers to not take a  daily medication).   CAD - coronary calcium score of 90, minimal nonobstructive CAD. Stable with no anginal symptoms. Heart healthy diet and regular cardiovascular exercise encouraged.  No indication for ischemic evaluation.  Continue ASA 81 mg daily, Zetia.   HLD - most recent LDL was elevated at 108 on 10/10/2021 however she has been intolerant to statins (Crestor, Lipitor and Simvastatin), declined PCSK9i, currently on Zetia 10 mg daily. Her PCP checked her FLP earlier today and results are pending. Would prefer her LDL be 70 or less. Could consider referral to PharmD pending lipid results.   Disposition - start metoprolol 25 mg PRN for bothersome palpitations. Keep f/u appt with Dr. Shari ProwsPemberton in July.            Medication Adjustments/Labs and Tests Ordered: Current medicines are reviewed at length with the patient today.  Concerns regarding medicines are outlined above.  No orders of the defined types were placed in this encounter.  Meds ordered this encounter  Medications   metoprolol tartrate (LOPRESSOR) 25 MG tablet    Sig: Take 1 tablet (25 mg total) by mouth as needed (PALPITATIONS). Take 1 tablet (25 mg) TWO hours prior to CT scan    Dispense:  30 tablet    Refill:  3    Patient Instructions  Medication Instructions:  START METOPROLOL 25 MG AS NEEDED FOR PALPITATIONS   Labwork: YOUR LDL CHOLESTEROL WAS 108 JANUARY 2023  Testing/Procedures: NONE  Follow-Up: KEEP WITH DR Aimee ProwsPEMBERTON AS SCHEDULED   Any Other Special Instructions Will Be Listed Below (If Applicable). THE EXTRA HEART BEATS YOU ARE HAVING ARE CALLED PAC'S (Premature Atrial Contractions)   Premature Atrial Contraction  A premature atrial contraction (PAC) is  a kind of irregular heartbeat (arrhythmia). The heart has four chambers, including the upper chambers (atria) and lower chambers (ventricles). Normally, an electrical signal starts in a group of cells called the sinoatrial node (SA node) and travels  through the atria, causing them to pump blood into the ventricles. During a PAC, the atria beat too early, before they have had time to fill with blood. The heartbeat pauses afterward so the heart can fill with blood for the next beat. Sometimes PAC can be a warning sign of another type of arrhythmia called atrial fibrillation. Atrial fibrillation may allow blood to pool in the atria and form clots. If a clot travels to the brain, it can cause a stroke. What are the causes? The cause of this condition is often unknown. Sometimes, this condition may be caused by heart disease or injury to the heart. What increases the risk? You are more likely to develop this condition if: You have heart disease. You are 51 years of age or older. Episodes may be triggered by: Tobacco, alcohol, or caffeine use. Stimulant drugs. Some medicines or supplements. Stress and anxiety. What are the signs or symptoms? Symptoms of this condition include: The feeling of a pause in the heartbeat. The first heartbeat after the "skipped" beat may feel more forceful. A feeling that your heart is fluttering. A quick feeling of dizziness or faintness. How is this diagnosed? This condition is diagnosed based on: Your medical history or symptoms. A physical exam. Your health care provider may listen to your heart. An ECG (electrocardiogram) to monitor the electrical activity of your heart. An ambulatory cardiac monitor that records your heartbeats for 24 hours or more. You may also have: Blood tests. An echocardiogram, which creates an image of your heart. How is this treated? Treatment depends on how often your symptoms happen and other risk factors. Treatments may include: Medicines. A catheter ablation procedure to destroy the part of the heart tissue that sends abnormal signals. In many cases, treatment may not be needed. Follow these instructions at home: Lifestyle  Do not use any products that contain nicotine or  tobacco. These products include cigarettes, chewing tobacco, and vaping devices, such as e-cigarettes. If you need help quitting, ask your health care provider. Exercise regularly. Ask your health care provider what type of exercise is safe for you. Try to get at least 7-9 hours of sleep each night. Find healthy ways to manage stress. Avoid stressful situations when possible. Alcohol use Do not drink alcohol if: Your health care provider tells you not to drink. You are pregnant, may be pregnant, or are planning to become pregnant. Alcohol triggers your episodes. If you drink alcohol: Limit how much you have to: 0-1 drink a day for women. 0-2 drinks a day for men. Know how much alcohol is in your drink. In the [place] one drink equals one 12 oz bottle of beer (355 mL), one 5 oz glass of wine (148 mL), or one 1 oz glass of hard liquor (44 mL). General instructions Take over-the-counter and prescription medicines only as told by your health care provider. If caffeine triggers episodes, do not eat, drink, or use anything with caffeine in it. Contact a health care provider if: You feel your heart is fluttering. Your heart skips beats and you feel dizzy, light-headed, or very tired. Get help right away if: You have chest pain. You have trouble breathing. You faint. You have any symptoms of a stroke. "BE FAST" is an easy way to  remember the main warning signs of a stroke: B - Balance. Signs are dizziness, sudden trouble walking, or loss of balance. E - Eyes. Signs are trouble seeing or a sudden change in vision. F - Face. Signs are sudden weakness or numbness of the face, or the face or eyelid drooping on one side. A - Arms. Signs are weakness or numbness in an arm. This happens suddenly and usually on one side of the body. S - Speech. Signs are sudden trouble speaking, slurred speech, or trouble understanding what people say. T - Time. Time to call emergency services. Write down what time  symptoms started. You have other signs of stroke, such as: A sudden, severe headache with no known cause. Nausea or vomiting. Seizure. These symptoms may be an emergency. Get help right away. Call 911. Do not wait to see if the symptoms will go away. Do not drive yourself to the hospital. This information is not intended to replace advice given to you by your health care provider. Make sure you discuss any questions you have with your health care provider. Document Revised: 02/02/2022 Document Reviewed: 02/02/2022 Elsevier Patient Education  [address]     Signed, Flossie Dibble, NP  12/24/2022 4:36 PM     [facility]

## 2022-12-24 NOTE — Assessment & Plan Note (Addendum)
Printed out prescription for Shingrix for patient to get at her local pharmacy.  Mammogram scheduled for later this morning.

## 2022-12-24 NOTE — Assessment & Plan Note (Signed)
Labs vitamin D levels in August 2022 low at 21.1.  Repeat vitamin levels still low at 27.  -Refill vitamin D 1000 units daily

## 2022-12-24 NOTE — Patient Instructions (Addendum)
Thank you, Aimee Davis for allowing [place] to provide your care today. Today we discussed your physical, IBS, cholesterol, restless leg syndrome and blood pressure.    Please make sure to go to your local pharmacy to get your shingles vaccine  I have ordered the following labs for you:   Lab Orders         Lipid Profile         Ferritin      I will call if any are abnormal. All of your labs can be accessed through "My Chart".   My Chart Access: [email or web address]  Please follow-up in 3 months  Please make sure to arrive 15 minutes prior to your next appointment. If you arrive late, you may be asked to reschedule.    We look forward to seeing you next time. Please call our clinic at [phone] if you have any questions or concerns. The best time to call is Monday-Friday from 9am-4pm, but there is someone available 24/7. If after hours or the weekend, call the main hospital number and ask for the Internal Medicine Resident On-Call. If you need medication refills, please notify your pharmacy one week in advance and they will send [place] a request.   Thank you for letting us take part in your care. Wishing you the best!  Steffanie Rainwater, MD 12/24/2022, 10:36 AM IM Resident, PGY-3 [name] 41:10

## 2022-12-24 NOTE — Assessment & Plan Note (Signed)
Patient's follows with GI closely.  She continues to have mild abdominal discomfort with intermittent diarrhea and constipation. She has not tolerated multiple IBS medications in the past and currently taking IBgard and probiotics. States she has a recent flare that was likely triggered by eating pizza. Patient interested in seeing a nutritionist for nutrition counseling and close monitoring of her dietary habits  Plan: -Follow-up with GI as needed -Continue IBgard and probiotics -Continue to follow FODMAP diet -Referral to medical nutrition counseling

## 2022-12-24 NOTE — Assessment & Plan Note (Signed)
Relatively stable. -Check ferritin

## 2022-12-25 ENCOUNTER — Telehealth: Payer: Self-pay

## 2022-12-25 ENCOUNTER — Telehealth: Payer: Self-pay | Admitting: *Deleted

## 2022-12-25 LAB — LIPID PANEL
Chol/HDL Ratio: 1.9 ratio (ref 0.0–4.4)
Cholesterol, Total: 205 mg/dL — ABNORMAL HIGH (ref 100–199)
HDL: 108 mg/dL (ref >39)
LDL Chol Calc (NIH): 81 mg/dL (ref 0–99)
Triglycerides: 91 mg/dL (ref 0–149)
VLDL Cholesterol Cal: 16 mg/dL (ref 5–40)

## 2022-12-25 LAB — VITAMIN D 25 HYDROXY (VIT D DEFICIENCY, FRACTURES): Vit D, 25-Hydroxy: 27 ng/mL — ABNORMAL LOW (ref 30.0–100.0)

## 2022-12-25 LAB — FERRITIN: Ferritin: 81 ng/mL (ref 15–150)

## 2022-12-25 MED ORDER — VITAMIN D 25 MCG (1000 UNIT) PO TABS: 1000.0000 [IU] | ORAL_TABLET | Freq: Every day | ORAL | 1 refills | Status: DC

## 2022-12-25 NOTE — Telephone Encounter (Signed)
Follow up call made to pt to see if she was able to reset MyChart password.  Pt states another [place] office assisted her with this matter and she no longer needs assistance.   Pt also wanted MD to know that she continues to have abd pain and would like a sooner appt with [facility] (currently scheduled for 03/14/2023). CMA contacted [place] at [phone], sooner appts not available, but pt was added to "cancellation list".  Pt would like to speak to MD about getting tested for celiac disease or other gluten related problem.   States something she's eating is causing her pain. Requested referral to nutritionist outside of Internal Medicine. Would also like to know her lab results from yesterday.    Message forwarded to pcp for review

## 2022-12-25 NOTE — Addendum Note (Signed)
Addended bySharrell Ku on: 12/25/2022 06:36 PM   Modules accepted: Orders

## 2022-12-25 NOTE — Telephone Encounter (Signed)
Patient called she is requesting a call back regarding her lab results  

## 2022-12-25 NOTE — Progress Notes (Signed)
Normal mammogram. Discussed results with patient and informed her to continue yearly breast cancer screening.

## 2022-12-25 NOTE — Telephone Encounter (Signed)
Spoke with patient and discussed her lab results.  I have informed her of the referral to medical nutrition.  She plans to go to [facility] tomorrow to see if she can switch her care to them.

## 2022-12-27 ENCOUNTER — Telehealth: Payer: Self-pay | Admitting: Student

## 2022-12-27 NOTE — Telephone Encounter (Signed)
Rec'd a call from the patient in regards to her Nutritional Referral being denied due to not being covered by her [employer] Ins for the dx of IBS.  Pt called today complaining of Stomach Pain and states she needs to know what to do now that she is denied.  The patient is sch with [facility] to address her IBS and was seen 09/2022 and no showed her 11/2022 and now can not be seen until 03/14/2023 by [facility].  Also saw that she is wanting to be seen by [facility] instead but she will not be seen any sooner with their office.  [place] had prev sent her to [facility] as well and it was denied.

## 2022-12-27 NOTE — Progress Notes (Signed)
Internal Medicine Clinic Attending  Case discussed with the resident at the time of the visit.  We reviewed the resident's history and exam and pertinent patient test results.  I agree with the assessment, diagnosis, and plan of care documented in the resident's note.  

## 2022-12-27 NOTE — Telephone Encounter (Signed)
RTC to patient to discuss options for her abdominal pain.  Patient was reminded that she had missed a GI appointment and is being rescheduled by C. Lissa Hoard the Referral Coordinator.  Next available appointment will not be until sometime in June. Will call GI to see if she can be put on a list for calling if there is a cancellation in that office. Patient was also informed that her insurance does not cover a Nutritionist. Patient was told that we do not want her to continue in pain.  Suggested that she go to the ER if the pain worsens before she gets a GI appointment.  Phone call abruptly ended by patent.

## 2022-12-27 NOTE — Telephone Encounter (Signed)
Thank you for the update. I can send her to Lupita Leash to help if she wants but it did not sound like she wanted to see her. There is not much else for [place] to do from primary care standpoint. She should follow up with GI and if it is severe to the point that she can't eat, she should go to the ER.

## 2023-01-07 ENCOUNTER — Emergency Department (HOSPITAL_BASED_OUTPATIENT_CLINIC_OR_DEPARTMENT_OTHER): Payer: 59

## 2023-01-07 ENCOUNTER — Other Ambulatory Visit: Payer: Self-pay

## 2023-01-07 ENCOUNTER — Observation Stay (HOSPITAL_BASED_OUTPATIENT_CLINIC_OR_DEPARTMENT_OTHER)
Admission: EM | Admit: 2023-01-07 | Discharge: 2023-01-10 | Disposition: A | Discharge status: Home / Self Care | Payer: 59 | Attending: Family Medicine | Admitting: Family Medicine

## 2023-01-07 ENCOUNTER — Encounter (HOSPITAL_BASED_OUTPATIENT_CLINIC_OR_DEPARTMENT_OTHER): Payer: Self-pay | Admitting: Urology

## 2023-01-07 DIAGNOSIS — K219 Gastro-esophageal reflux disease without esophagitis: Secondary | ICD-10-CM | POA: Diagnosis present

## 2023-01-07 DIAGNOSIS — E871 Hypo-osmolality and hyponatremia: Secondary | ICD-10-CM | POA: Insufficient documentation

## 2023-01-07 DIAGNOSIS — R002 Palpitations: Secondary | ICD-10-CM | POA: Insufficient documentation

## 2023-01-07 DIAGNOSIS — R0602 Shortness of breath: Secondary | ICD-10-CM | POA: Diagnosis not present

## 2023-01-07 DIAGNOSIS — Z8616 Personal history of COVID-19: Secondary | ICD-10-CM | POA: Insufficient documentation

## 2023-01-07 DIAGNOSIS — J45909 Unspecified asthma, uncomplicated: Secondary | ICD-10-CM | POA: Diagnosis not present

## 2023-01-07 DIAGNOSIS — J449 Chronic obstructive pulmonary disease, unspecified: Secondary | ICD-10-CM | POA: Diagnosis not present

## 2023-01-07 DIAGNOSIS — I2510 Atherosclerotic heart disease of native coronary artery without angina pectoris: Secondary | ICD-10-CM | POA: Diagnosis not present

## 2023-01-07 DIAGNOSIS — Z79899 Other long term (current) drug therapy: Secondary | ICD-10-CM | POA: Diagnosis not present

## 2023-01-07 DIAGNOSIS — E785 Hyperlipidemia, unspecified: Secondary | ICD-10-CM | POA: Diagnosis present

## 2023-01-07 DIAGNOSIS — Z7982 Long term (current) use of aspirin: Secondary | ICD-10-CM | POA: Insufficient documentation

## 2023-01-07 DIAGNOSIS — J309 Allergic rhinitis, unspecified: Secondary | ICD-10-CM | POA: Diagnosis present

## 2023-01-07 DIAGNOSIS — Z87891 Personal history of nicotine dependence: Secondary | ICD-10-CM | POA: Diagnosis not present

## 2023-01-07 DIAGNOSIS — R079 Chest pain, unspecified: Secondary | ICD-10-CM | POA: Diagnosis not present

## 2023-01-07 LAB — CBC
HCT: 40.7 % (ref 36.0–46.0)
Hemoglobin: 13.2 g/dL (ref 12.0–15.0)
MCH: 26.9 pg (ref 26.0–34.0)
MCHC: 32.4 g/dL (ref 30.0–36.0)
MCV: 82.9 fL (ref 80.0–100.0)
Platelets: 301 10*3/uL (ref 150–400)
RBC: 4.91 MIL/uL (ref 3.87–5.11)
RDW: 12.5 % (ref 11.5–15.5)
WBC: 3.6 10*3/uL — ABNORMAL LOW (ref 4.0–10.5)
nRBC: 0 % (ref 0.0–0.2)

## 2023-01-07 LAB — HEPATIC FUNCTION PANEL
ALT: 19 U/L (ref 0–44)
AST: 23 U/L (ref 15–41)
Albumin: 3.7 g/dL (ref 3.5–5.0)
Alkaline Phosphatase: 83 U/L (ref 38–126)
Bilirubin, Direct: <0.1 mg/dL (ref 0.0–0.2)
Total Bilirubin: 0.3 mg/dL (ref 0.3–1.2)
Total Protein: 6.5 g/dL (ref 6.5–8.1)

## 2023-01-07 LAB — TROPONIN I (HIGH SENSITIVITY)
Troponin I (High Sensitivity): 5 ng/L (ref <18)
Troponin I (High Sensitivity): 4 ng/L (ref <18)

## 2023-01-07 LAB — BASIC METABOLIC PANEL
Anion gap: 8 (ref 5–15)
BUN: 23 mg/dL (ref 8–23)
CO2: 25 mmol/L (ref 22–32)
Calcium: 9.6 mg/dL (ref 8.9–10.3)
Chloride: 100 mmol/L (ref 98–111)
Creatinine, Ser: 1.09 mg/dL — ABNORMAL HIGH (ref 0.44–1.00)
GFR, Estimated: 55 mL/min — ABNORMAL LOW (ref >60)
Glucose, Bld: 110 mg/dL — ABNORMAL HIGH (ref 70–99)
Potassium: 4.1 mmol/L (ref 3.5–5.1)
Sodium: 133 mmol/L — ABNORMAL LOW (ref 135–145)

## 2023-01-07 LAB — LIPASE, BLOOD: Lipase: 29 U/L (ref 11–51)

## 2023-01-07 LAB — MAGNESIUM: Magnesium: 2.3 mg/dL (ref 1.7–2.4)

## 2023-01-07 LAB — BRAIN NATRIURETIC PEPTIDE: B Natriuretic Peptide: 14.5 pg/mL (ref 0.0–100.0)

## 2023-01-07 NOTE — ED Provider Notes (Signed)
[place] [facility] AT [facility] Provider Note   CSN: [id number] Arrival date & time: 01/07/23  1935     History  Chief Complaint  Patient presents with   Palpitations    Aimee Davis is a 67 y.o. female.  The history is provided by the patient and medical records. No language interpreter was used.  Palpitations Palpitations quality:  Irregular Duration:  2 weeks Timing:  Intermittent Progression:  Waxing and waning Chronicity:  Recurrent Relieved by:  Nothing Worsened by:  Nothing Ineffective treatments:  None tried Associated symptoms: chest pain, lower extremity edema, malaise/fatigue, near-syncope, shortness of breath and syncope   Associated symptoms: no back pain, no cough, no diaphoresis, no dizziness, no leg pain, no nausea, no numbness, no vomiting and no weakness        Home Medications Prior to Admission medications   Medication Sig Start Date End Date Taking? Authorizing Provider  albuterol (VENTOLIN HFA) 108 (90 Base) MCG/ACT inhaler USE 2 INHALATIONS BY MOUTH EVERY 6 HOURS AS NEEDED FOR WHEEZING  OR SHORTNESS OF BREATH 12/20/22   Steffanie Rainwater, MD  Artificial Tear Ointment (DRY EYES OP) Apply 1 drop to eye daily as needed (dry eyes).    [provider]  Ascorbic Acid (VITAMIN C) 100 MG tablet Take 50 mg by mouth daily.    [provider]  aspirin EC 81 MG tablet Take 1 tablet (81 mg total) by mouth daily. Swallow whole. 11/23/22   Orbie Pyo, MD  B Complex-C (B-COMPLEX WITH VITAMIN C) tablet Take 1 tablet by mouth daily.    [provider]  cholecalciferol (VITAMIN D3) 25 MCG (1000 UNIT) tablet Take 1 tablet (1,000 Units total) by mouth daily. 12/25/22   Steffanie Rainwater, MD  ezetimibe (ZETIA) 10 MG tablet Take 10 mg by mouth daily.    [provider]  fexofenadine (ALLEGRA) 180 MG tablet Take 1 tablet (180 mg total) by mouth daily. 12/24/22   Steffanie Rainwater, MD   Fluticasone-Umeclidin-Vilant (TRELEGY ELLIPTA) 200-62.5-25 MCG/ACT AEPB USE 1 INHALATION BY MOUTH ONCE  DAILY 07/16/22   Evlyn Kanner, MD  metoprolol tartrate (LOPRESSOR) 25 MG tablet Take 1 tablet (25 mg total) by mouth as needed (PALPITATIONS). Take 1 tablet (25 mg) TWO hours prior to CT scan 12/24/22   Alver Sorrow, NP  mometasone (NASONEX) 50 MCG/ACT nasal spray PLACE 2 SPRAYS IN EACH NOSTRIL ONCE DAILY AS NEEDED. 06/15/21   Dolan Amen, MD  Multiple Vitamins-Minerals (MULTIVITAMIN WITH MINERALS) tablet Take 1 tablet by mouth daily.    [provider]  OVER THE COUNTER MEDICATION EO MEGA Essential Oil - 2 daily    [provider]  pantoprazole (PROTONIX) 40 MG tablet TAKE 1 TABLET BY MOUTH DAILY 30  TO 60 MINUTES BEFORE BREAKFAST 12/24/22   Steffanie Rainwater, MD      Allergies    Dicyclomine, Hydrocodone, Lipitor [atorvastatin], Penicillins, and Levsin [hyoscyamine]    Review of Systems   Review of Systems  Constitutional:  Positive for fatigue and malaise/fatigue. Negative for chills, diaphoresis and fever.  HENT:  Negative for congestion.   Respiratory:  Positive for shortness of breath. Negative for cough, chest tightness and wheezing.   Cardiovascular:  Positive for chest pain, palpitations, leg swelling (subjective per pt), syncope and near-syncope.  Gastrointestinal:  Negative for abdominal pain, constipation, diarrhea, nausea and vomiting.  Genitourinary:  Negative for dysuria and flank pain.  Musculoskeletal:  Negative for back pain, neck pain  and neck stiffness.  Skin:  Negative for rash and wound.  Neurological:  Positive for syncope and light-headedness. Negative for dizziness, weakness, numbness and headaches.  Psychiatric/Behavioral:  Negative for agitation and confusion.     Physical Exam Updated Vital Signs BP 117/69 (BP Location: Left Arm)   Pulse 94   Temp 98.4 F (36.9 C) (Oral)   Resp 20   Ht  (1.6 m)   Wt 68 kg   SpO2 99%    BMI 26.57 kg/m  Physical Exam Vitals and nursing note reviewed.  Constitutional:      General: She is not in acute distress.    Appearance: She is well-developed. She is not ill-appearing, toxic-appearing or diaphoretic.  HENT:     Head: Normocephalic and atraumatic.     Nose: No congestion or rhinorrhea.     Mouth/Throat:     Mouth: Mucous membranes are moist.     Pharynx: No oropharyngeal exudate or posterior oropharyngeal erythema.  Eyes:     Extraocular Movements: Extraocular movements intact.     Conjunctiva/sclera: Conjunctivae normal.     Pupils: Pupils are equal, round, and reactive to light.  Cardiovascular:     Rate and Rhythm: Regular rhythm. Tachycardia present.     Heart sounds: No murmur heard. Pulmonary:     Effort: Pulmonary effort is normal. No respiratory distress.     Breath sounds: Normal breath sounds. No wheezing, rhonchi or rales.  Chest:     Chest wall: No tenderness.  Abdominal:     General: Abdomen is flat.     Palpations: Abdomen is soft.     Tenderness: There is no abdominal tenderness. There is no right CVA tenderness, left CVA tenderness, guarding or rebound.  Musculoskeletal:        General: No swelling or tenderness.     Cervical back: Neck supple. No tenderness.     Right lower leg: No edema.     Left lower leg: No edema.  Skin:    General: Skin is warm and dry.     Capillary Refill: Capillary refill takes less than 2 seconds.     Findings: No erythema or rash.  Neurological:     General: No focal deficit present.     Mental Status: She is alert.     Sensory: No sensory deficit.     Motor: No weakness.  Psychiatric:        Mood and Affect: Mood normal.     ED Results / Procedures / Treatments   Labs (all labs ordered are listed, but only abnormal results are displayed) Labs Reviewed  BASIC METABOLIC PANEL - Abnormal; Notable for the following components:      Result Value   Sodium 133 (*)    Glucose, Bld 110 (*)    Creatinine,  Ser 1.09 (*)    GFR, Estimated 55 (*)    All other components within normal limits  CBC - Abnormal; Notable for the following components:   WBC 3.6 (*)    All other components within normal limits  BRAIN NATRIURETIC PEPTIDE  HEPATIC FUNCTION PANEL  LIPASE, BLOOD  MAGNESIUM  TSH  TROPONIN I (HIGH SENSITIVITY)  TROPONIN I (HIGH SENSITIVITY)    EKG None  Radiology DG Chest 2 View  Result Date: 01/07/2023 CLINICAL DATA:  Palpitations. Fatigue. EXAM: CHEST - 2 VIEW COMPARISON:  Radiograph 10/10/2021, CT 10/31/2021 FINDINGS: The emphysematous changes on prior CT are not well demonstrated by radiograph.The cardiomediastinal contours are normal. The lungs  are clear. Pulmonary vasculature is normal. No consolidation, pleural effusion, or pneumothorax. No acute osseous abnormalities are seen. IMPRESSION: No acute chest findings. Electronically Signed   By: Narda Rutherford M.D.   On: 01/07/2023 20:00    Procedures Procedures    Medications Ordered in ED Medications - No data to display  ED Course/ Medical Decision Making/ A&P                             Medical Decision Making Amount and/or Complexity of Data Reviewed Labs: ordered. Radiology: ordered.    LYNANNE DELGRECO is a 67 y.o. female with a past medical history significant for GERD, depression, hyperlipidemia, asthma, COPD, and recent diagnosis of SVT intermittently who presents with recurrent palpitations as well as 2 weeks of worsening exertional shortness of breath and chest discomfort.  According to patient, for the last 2 weeks she has been having palpitations on and off and spoke to cardiologist who did a Zio patch and told her that she had several episodes of brief SVT.  Documentation shows that she was started on metoprolol tartrate 25 mg as needed however patient reports she has not received this medication and has not taken any of it.  Patient said that she has felt lightheaded and fatigued and even had a syncopal  episode over the weekend.  She denies any syncope today but is still having the fatigue and lightheadedness.  She reports she is having the exertional shortness of breath and fatigue and reports that her legs have been swollen.  She denies history of heart failure and denies history of DVT or PE.  On exam, lungs were clear and chest was nontender.  I do not appreciate a murmur.  Abdomen nontender.  Good pulses in extremities.  Legs are nontender and did not have significant edema on my exam.  Bowel sounds were appreciated.  EKG does not show STEMI.  Clinically I suspect patient had recurrent palpitations that she has had for the last few weeks and had not yet started on the treatment that her cardiology wanted.  On my first evaluation, heart rate is around 60 so we will hold on metoprolol at this time.  Due to the exertional shortness of breath, exertional chest discomfort, and her worsening subjective edema, we will get a BNP, get a pulse oximetry with ambulation, troponin, and other labs.   She had some workup started in triage including a CBC that did not show leukocytosis or anemia.  Metabolic panel showed creatinine of 1.09 and otherwise had mild hyponatremia.  Initial troponin is negative.  Will trend troponin and get other labs such as TSH and magnesium and hepatic function with her chest discomfort.  Anticipate reassessment after workup.    Given patient's syncope, exertional shortness of breath and chest discomfort, patient may require admission if workup reveals any concerning findings.  If workup reassuring, patient may be stable for discharge for outpatient PCP and cardiology follow-up.  11:15 PM Labs have began to return.  Troponin negative x 2.  BNP not elevated.  Magnesium normal.  Hepatic function lipase not elevated.  Overall workup reassuring thus far although patient was still complaining of the intermittent palpitations and exertional chest pain and shortness of breath and reports  that she had a syncopal episode several days ago.  Will discuss with cardiology to see if she needs admission for further cardiac workup.  Care transferred to oncoming team to wait to  talk to cardiology and discuss possible admission for concerning chest discomfort syncope and shortness of breath.        Final Clinical Impression(s) / ED Diagnoses Final diagnoses:  Palpitations  Exertional shortness of breath  Exertional chest pain    Clinical Impression: 1. Palpitations   2. Exertional shortness of breath   3. Exertional chest pain     Disposition: Care transferred to oncoming team to wait to talk to cardiology and discuss possible admission for concerning chest discomfort syncope and shortness of breath.  This note was prepared with assistance of Conservation officer, historic buildings. Occasional wrong-word or sound-a-like substitutions may have occurred due to the inherent limitations of voice recognition software.      Beauregard Jarrells, Canary Brim, MD 01/07/23 2340

## 2023-01-07 NOTE — ED Triage Notes (Signed)
Pt states has had some heart fluttering going on that started this morning  Denies pain, states feels very fatigued

## 2023-01-07 NOTE — ED Notes (Signed)
Patient transported to X-ray 

## 2023-01-08 ENCOUNTER — Encounter (HOSPITAL_COMMUNITY): Payer: Self-pay | Admitting: Internal Medicine

## 2023-01-08 ENCOUNTER — Emergency Department (HOSPITAL_BASED_OUTPATIENT_CLINIC_OR_DEPARTMENT_OTHER): Payer: 59

## 2023-01-08 DIAGNOSIS — I2510 Atherosclerotic heart disease of native coronary artery without angina pectoris: Secondary | ICD-10-CM | POA: Diagnosis not present

## 2023-01-08 DIAGNOSIS — Z7982 Long term (current) use of aspirin: Secondary | ICD-10-CM | POA: Diagnosis not present

## 2023-01-08 DIAGNOSIS — R0602 Shortness of breath: Secondary | ICD-10-CM | POA: Diagnosis not present

## 2023-01-08 DIAGNOSIS — J45909 Unspecified asthma, uncomplicated: Secondary | ICD-10-CM | POA: Diagnosis not present

## 2023-01-08 DIAGNOSIS — E782 Mixed hyperlipidemia: Secondary | ICD-10-CM

## 2023-01-08 DIAGNOSIS — K219 Gastro-esophageal reflux disease without esophagitis: Secondary | ICD-10-CM

## 2023-01-08 DIAGNOSIS — E871 Hypo-osmolality and hyponatremia: Secondary | ICD-10-CM | POA: Diagnosis not present

## 2023-01-08 DIAGNOSIS — J449 Chronic obstructive pulmonary disease, unspecified: Secondary | ICD-10-CM

## 2023-01-08 DIAGNOSIS — R002 Palpitations: Secondary | ICD-10-CM | POA: Diagnosis not present

## 2023-01-08 DIAGNOSIS — Z8616 Personal history of COVID-19: Secondary | ICD-10-CM | POA: Diagnosis not present

## 2023-01-08 DIAGNOSIS — Z87891 Personal history of nicotine dependence: Secondary | ICD-10-CM | POA: Diagnosis not present

## 2023-01-08 DIAGNOSIS — E785 Hyperlipidemia, unspecified: Secondary | ICD-10-CM | POA: Diagnosis present

## 2023-01-08 DIAGNOSIS — Z79899 Other long term (current) drug therapy: Secondary | ICD-10-CM | POA: Diagnosis not present

## 2023-01-08 DIAGNOSIS — J301 Allergic rhinitis due to pollen: Secondary | ICD-10-CM

## 2023-01-08 DIAGNOSIS — R079 Chest pain, unspecified: Secondary | ICD-10-CM | POA: Diagnosis present

## 2023-01-08 LAB — TSH: TSH: 4.329 u[IU]/mL (ref 0.350–4.500)

## 2023-01-08 MED ORDER — ACETAMINOPHEN 650 MG RE SUPP: 650.0000 mg | Freq: Four times a day (QID) | RECTAL | Status: DC | PRN

## 2023-01-08 MED ORDER — MELATONIN 3 MG PO TABS: 3.0000 mg | ORAL_TABLET | Freq: Every evening | ORAL | Status: DC | PRN

## 2023-01-08 MED ORDER — PANTOPRAZOLE SODIUM 40 MG PO TBEC: 40.0000 mg | DELAYED_RELEASE_TABLET | Freq: Every day | ORAL | Status: DC

## 2023-01-08 MED ORDER — EZETIMIBE 10 MG PO TABS
10.0000 mg | ORAL_TABLET | Freq: Every day | ORAL | Status: DC
  Administered 2023-01-09 – 2023-01-10 (×2): 10 mg via ORAL
  Filled 2023-01-08 (×2): qty 1

## 2023-01-08 MED ORDER — FLUTICASONE FUROATE-VILANTEROL 200-25 MCG/ACT IN AEPB
1.0000 | INHALATION_SPRAY | Freq: Every day | RESPIRATORY_TRACT | Status: DC
  Administered 2023-01-09: 1 via RESPIRATORY_TRACT
  Filled 2023-01-08: qty 28

## 2023-01-08 MED ORDER — ONDANSETRON HCL 4 MG/2ML IJ SOLN: 4.0000 mg | Freq: Four times a day (QID) | INTRAMUSCULAR | Status: DC | PRN

## 2023-01-08 MED ORDER — IOHEXOL 350 MG/ML SOLN
75.0000 mL | Freq: Once | INTRAVENOUS | Status: AC | PRN
  Administered 2023-01-08: 75 mL via INTRAVENOUS

## 2023-01-08 MED ORDER — ALBUTEROL SULFATE (2.5 MG/3ML) 0.083% IN NEBU: 2.5000 mg | INHALATION_SOLUTION | RESPIRATORY_TRACT | Status: DC | PRN

## 2023-01-08 MED ORDER — FLUTICASONE PROPIONATE 50 MCG/ACT NA SUSP
1.0000 | Freq: Every day | NASAL | Status: DC
  Administered 2023-01-10: 1 via NASAL
  Filled 2023-01-08: qty 16

## 2023-01-08 MED ORDER — ACETAMINOPHEN 325 MG PO TABS: 650.0000 mg | ORAL_TABLET | Freq: Four times a day (QID) | ORAL | Status: DC | PRN

## 2023-01-08 MED ORDER — UMECLIDINIUM BROMIDE 62.5 MCG/ACT IN AEPB
1.0000 | INHALATION_SPRAY | Freq: Every day | RESPIRATORY_TRACT | Status: DC
  Administered 2023-01-09: 1 via RESPIRATORY_TRACT
  Filled 2023-01-08: qty 7

## 2023-01-08 MED ORDER — ASPIRIN 81 MG PO TBEC
81.0000 mg | DELAYED_RELEASE_TABLET | Freq: Every day | ORAL | Status: DC
  Administered 2023-01-09 – 2023-01-10 (×2): 81 mg via ORAL
  Filled 2023-01-08 (×2): qty 1

## 2023-01-08 MED ORDER — NITROGLYCERIN 0.4 MG SL SUBL: 0.4000 mg | SUBLINGUAL_TABLET | SUBLINGUAL | Status: DC | PRN

## 2023-01-08 NOTE — Progress Notes (Signed)
Plan of Care Note for accepted transfer   Patient: Aimee Davis MRN: [id number]   DOA: 01/07/2023  Facility requesting transfer: [facility] ED Requesting Provider: Dr. Pilar Plate, EDP Reason for transfer: Chest pain, rule out ACS  Facility course: 67 year old female with past medical history significant for coronary artery disease on aspirin and Zetia, hyperlipidemia, restless leg syndrome, who presented to [facility] ED with complaints of palpitations and chest discomfort.  With onset last several weeks, progressively worsening.  Associated with dyspnea.  Endorses passing out several days ago while having the same symptoms.  Previously placed on Zio patch per cardiology.  She was on ZIO monitor which showed predominant underlying rhythm-sinus with average heart rate of 82 bpm, 2 runs of SVT noted with the fastest lasting 8 beats at a max rate of 154 bpm.  She was started on p.o. Lopressor by cardiology 25 mg to take as needed when she has palpitations.  Per cardiology the patient prefers not to take a daily medication.  In the ED, her physical exam is notable for left lower extremity edema greater than right lower extremity.  Due to concern for possible thromboembolism a CT angio chest was ordered and is pending.  High-sensitivity troponin were negative x2.  No evidence of acute ischemia on twelve-lead EKG.  EDP discussed the case with cardiology on-call who recommended admission by the medicine team.  Cardiology will see in consultation for further workup.  Admitted to [facility] telemetry cardiac unit as observation status.  Plan of care: The patient is accepted for admission to Telemetry unit, at [facility].  Author: Darlin Drop, DO 01/08/2023  Check [email or web address] for on-call coverage.  Nursing staff, Please call [facility] Admits & Consults System-Wide number on Amion as soon as patient's arrival, so appropriate admitting provider can evaluate the pt.

## 2023-01-08 NOTE — Progress Notes (Signed)
Pt arrived from [facility] for admit. [facility] admits # paged via amion to make aware.

## 2023-01-08 NOTE — ED Provider Notes (Signed)
  Provider Note MRN:  [id number]  Arrival date & time: 01/08/23    ED Course and Medical Decision Making  Assumed care from Dr. Pricilla Holm at shift change.  Patient having exertional chest pain or shortness of breath for the past few weeks getting worse in the past few days.  Had a syncopal episode during these symptoms a few days ago.  Discussed case with cardiology who agrees with medicine admission, they will consider stress testing in the morning.  On my exam patient has some left leg swelling greater than right and so will obtain CT PE study to exclude PE, has not had testing of this condition to date.  Spoke with Dr. Margo Aye of hospitalist service who accepts patient for admission.  Procedures  Final Clinical Impressions(s) / ED Diagnoses     ICD-10-CM   1. Palpitations  R00.2     2. Exertional shortness of breath  R06.02     3. Exertional chest pain  R07.9       ED Discharge Orders     None       Discharge Instructions   None     Elmer Sow. Pilar Plate, MD [facility] Emergency Medicine [facility] [email or web address]    Sabas Sous, MD 01/08/23 [phone]

## 2023-01-08 NOTE — ED Notes (Incomplete)
Pt. Up to restroom with no problem.. Pt. Walked steady gait.  Pt. In no distress.  Explained to Pt. That

## 2023-01-08 NOTE — H&P (Signed)
History and Physical      Aimee Davis:[id number] DOB: [birth year] DOA: 01/07/2023  PCP: Steffanie Rainwater, MD  Patient coming from: home   I have personally briefly reviewed patient's old medical records in [facility]  Chief Complaint: chest pain  HPI: Aimee Davis is a 67 y.o. female with medical history significant for COPD, GERD, hyperlipidemia, allergic rhinitis, who is admitted to [facility] on 01/07/2023 by way of transfer from [facility] emergency department with chest pain after presenting from home to the latter facility complaining of chest pain.  The patient has been experiencing 3 to 4 weeks of intermittent episodes of substernal nonradiating chest pressure, that is nonexertional.  These episodes are nonpleuritic, nonpositional, and activities which are palpation over the anterior chest wall.  They resolve spontaneously within minutes of onset without any interval nitroglycerin.  She notes that the episodes are associated with palpitations as well as shortness of breath, not associate with any nausea, vomiting, diaphoresis.  She conveys that her concern and experiencing these intermittent episodes of chest discomfort over the last few weeks, as that they appear to be occurring more frequently, she notes increasing intensity with associated discomfort when they do occur.  Denies any associated subjective fever, chills, rigors, or generalized myalgias.  No recent cough, hemoptysis, wheezing.  No recent new calf tenderness or new lower extremity erythema.  No recent trauma.  She confirms that she is currently chest pain-free.  She has a reported history of coronary artery disease, and follows with cardiology as an outpatient.  In the setting of these intermittent episodes of chest pain over the course of the last 3 to 4 weeks, she underwent evaluation via Zio patch as an outpatient, which reportedly showed 2 runs of SVT, the longest of which was an 8  beat run, with sinus rhythm predominant underlying rhythm.  In light of these findings on Zio patch, the patient was started on prn Lopressor, with instructions to take this medication for palpitations, while acknowledging the patient prefers to not take any scheduled beta-blocker.  However, in the context of further increase in frequency and intensity of her intermittent chest pain, the patient presents to [facility] emergency department for further evaluation management thereof.     [facility] ED Course:  Vital signs in the ED were notable for the following: Afebrile; rates in the 60s to 80s; systolic blood pressures in the low 100s to 120s; respiratory rate 16-20, oxygen saturation 9020% on room air.  Labs were notable for the following: CMP notable for the following: Sodium 133 compared to most recent prior serum sodium data point of 141 on 11/23/2022, potassium 4.1, bicarbonate 25, creatinine 1.09 compared to 0.85 on 12/03/2022, liver enzymes within normal limits.  Lipase 29.  Serum magnesium level 2.3.  Initial high-sensitivity troponin I 55, 3 value trending on 4.  BNP 14.5.  CBC notable for white blood cell count 3600, hemoglobin 13.2.  TSH 4.329.  Per my interpretation, EKG in ED demonstrated the following: Sinus rhythm with heart rate 86, normal intervals, nonspecific T wave inversion in aVL, V1, V2 of which T wave inversion in aVL and V1 appears unchanged from most recent prior EKG performed on 11/23/2022,, while showing no evidence of ST changes, including no evidence of ST elevation.  Imaging and additional notable ED work-up: 2 view chest x-ray shows no evidence of acute cardiopulmonary process, including no evidence of infiltrate, edema, effusion, or  pneumothorax.  CTA chest with PE protocol showed no evidence of acute cardiopulmonary process, including evidence of pulmonary embolism, infiltrate, edema, effusion, or pneumothorax.  EDP at [facility] discussed  patient's case with the on-call [facility] cardiologist, who recommended transfer to [facility] admission for further evaluation management of presenting chest pain, and conveyed the cardiology will formally consult, with additional recommendations pending at this time.  While in the ED, the following were administered: None.  Subsequently, the patient was admitted for further evaluation and management present chest pain, presenting labs also notable for acute hyponatremia.     Review of Systems: As per HPI otherwise 10 point review of systems negative.   Past Medical History:  Diagnosis Date   Abdominal pain 08/16/2008   Qualifier: Diagnosis of  By: Daphine Deutscher FNP, [name]     Abdominal pain, epigastric 09/08/2019   Allergy    seasonal, PCN, antidepressants, bentyl   Aortic atherosclerosis 02/23/2021   Arthritis    Asthma    Asymptomatic bacteriuria 08/24/2021   UA showed leukocytes but patient does not have dysuria, fevers or hematuria. She also had bacteruria in her last UA. Her symptoms of fatigue, and loss of appetite are non-specific but if persist or she begins having dysuria we can work up further. A UTI would not explain the abdominal pain patient is having.    Carpal tunnel syndrome on both sides    right worse than left 2008   Cataract 06/11/2013   COPD (chronic obstructive pulmonary disease)    COVID-19 virus infection 06/18/2016   Eczema 05/02/2018   Food poisoning 04/04/2021   GERD (gastroesophageal reflux disease)    Hair loss 07/07/2019   Hyperlipidemia    LDL 108 JULY 2013   IBS (irritable bowel syndrome)    Macular rash 05/20/2019   Multiple nevi 03/01/2015   Pneumonia    Positive TB test    From [facility] 2009 note : Recent F/u at [facility]. CXRAy negative.  Positive TST in 2007 (14mm).  Unable to tolerate INH due to hepatotoxicity   Primary insomnia 06/05/2012   Skin macule 04/21/2019   Tinnitus 07/07/2019   URI (upper respiratory infection) 11/01/2010   Qualifier: Diagnosis  of  By: Daphine Deutscher FNP, Zena Amos      Past Surgical History:  Procedure Laterality Date   BREAST CYST EXCISION Right 2011   BREAST EXCISIONAL BIOPSY     benign   BREAST EXCISIONAL BIOPSY Right 1990   benign   INDUCED ABORTION  2005   VAGINAL HYSTERECTOMY  1983   partial; for abnormal bleeding    Social History:  reports that she quit smoking about 10 years ago. Her smoking use included cigarettes. She has a 14.19 pack-year smoking history. She has never used smokeless tobacco. She reports that she does not currently use drugs. She reports that she does not drink alcohol.   Allergies  Allergen Reactions   Dicyclomine     confusion   Hydrocodone Other (See Comments)    Recovering addict X 29 years; no narcotics per patient   Lipitor [Atorvastatin] Other (See Comments)    Pt reports causes upset stomach, cramps in legs, poor appetite, and sleep disturbance   Penicillins Other (See Comments)    REACTION: rash   Levsin [Hyoscyamine] Anxiety    Family History  Problem Relation Age of Onset   Hypertension Mother    Heart disease Mother    Diabetes Mother    Arthritis Mother    Stroke Father  Diabetes Father    Heart disease Father    Hyperlipidemia Father    Hypertension Father    Kidney disease Father    Hypertension Sister    Breast cancer Sister    Breast cancer Sister    Hypertension Sister    Arthritis Sister    Hypertension Sister    Diabetes Sister    Mental illness Brother    Aneurysm Brother        brain   Arthritis Brother    Hypertension Maternal Aunt    Hypertension Maternal Uncle    Stomach cancer Maternal Uncle    Hypertension Paternal Aunt    Kidney disease Paternal Aunt        x 2   Hypertension Paternal Uncle    Kidney disease Paternal Uncle        x 2   Colon cancer Neg Hx    Esophageal cancer Neg Hx    Pancreatic cancer Neg Hx     Family history reviewed and not pertinent    Prior to Admission medications   Medication Sig Start Date  End Date Taking? Authorizing Provider  albuterol (VENTOLIN HFA) 108 (90 Base) MCG/ACT inhaler USE 2 INHALATIONS BY MOUTH EVERY 6 HOURS AS NEEDED FOR WHEEZING  OR SHORTNESS OF BREATH 12/20/22   Steffanie Rainwater, MD  Artificial Tear Ointment (DRY EYES OP) Apply 1 drop to eye daily as needed (dry eyes).    [provider]  Ascorbic Acid (VITAMIN C) 100 MG tablet Take 50 mg by mouth daily.    [provider]  aspirin EC 81 MG tablet Take 1 tablet (81 mg total) by mouth daily. Swallow whole. 11/23/22   Orbie Pyo, MD  B Complex-C (B-COMPLEX WITH VITAMIN C) tablet Take 1 tablet by mouth daily.    [provider]  cholecalciferol (VITAMIN D3) 25 MCG (1000 UNIT) tablet Take 1 tablet (1,000 Units total) by mouth daily. 12/25/22   Steffanie Rainwater, MD  ezetimibe (ZETIA) 10 MG tablet Take 10 mg by mouth daily.    [provider]  fexofenadine (ALLEGRA) 180 MG tablet Take 1 tablet (180 mg total) by mouth daily. 12/24/22   Steffanie Rainwater, MD  Fluticasone-Umeclidin-Vilant (TRELEGY ELLIPTA) 200-62.5-25 MCG/ACT AEPB USE 1 INHALATION BY MOUTH ONCE  DAILY 07/16/22   Evlyn Kanner, MD  metoprolol tartrate (LOPRESSOR) 25 MG tablet Take 1 tablet (25 mg total) by mouth as needed (PALPITATIONS). Take 1 tablet (25 mg) TWO hours prior to CT scan 12/24/22   Alver Sorrow, NP  mometasone (NASONEX) 50 MCG/ACT nasal spray PLACE 2 SPRAYS IN EACH NOSTRIL ONCE DAILY AS NEEDED. 06/15/21   Dolan Amen, MD  Multiple Vitamins-Minerals (MULTIVITAMIN WITH MINERALS) tablet Take 1 tablet by mouth daily.    [provider]  OVER THE COUNTER MEDICATION EO MEGA Essential Oil - 2 daily    [provider]  pantoprazole (PROTONIX) 40 MG tablet TAKE 1 TABLET BY MOUTH DAILY 30  TO 60 MINUTES BEFORE BREAKFAST 12/24/22   Steffanie Rainwater, MD     Objective    Physical Exam: Vitals:   01/08/23 1715 01/08/23 2000 01/08/23 2030 01/08/23 2207  BP: 109/64 107/64 118/67 121/70   Pulse: 86 64 63 71  Resp: 17 17 16 16   Temp: 97.8 F (36.6 C)  98 F (36.7 C) 97.7 F (36.5 C)  TempSrc:   Oral Oral  SpO2: 99% 99% 98% 96%  Weight:    64.9 kg  Height:   (1.6 m)    General: appears to be stated age; alert, oriented Skin: warm, dry, no rash Head:  AT/[place] Mouth:  Oral mucosa membranes appear moist, normal dentition Neck: supple; trachea midline Heart:  RRR; did not appreciate any M/R/G Lungs: CTAB, did not appreciate any wheezes, rales, or rhonchi Abdomen: + BS; soft, ND, NT Vascular: 2+ pedal pulses b/l; 2+ radial pulses b/l Extremities: no peripheral edema, no muscle wasting Neuro: strength and sensation intact in upper and lower extremities b/l    Labs on Admission: I have personally reviewed following labs and imaging studies  CBC: Recent Labs  Lab 01/07/23 1947  WBC 3.6*  HGB 13.2  HCT 40.7  MCV 82.9  PLT 301   Basic Metabolic Panel: Recent Labs  Lab 01/07/23 1947 01/07/23 2229  NA 133*  --   K 4.1  --   CL 100  --   CO2 25  --   GLUCOSE 110*  --   BUN 23  --   CREATININE 1.09*  --   CALCIUM 9.6  --   MG  --  2.3   GFR: Estimated Creatinine Clearance: 44.8 mL/min (A) (by C-G formula based on SCr of 1.09 mg/dL (H)). Liver Function Tests: Recent Labs  Lab 01/07/23 2229  AST 23  ALT 19  ALKPHOS 83  BILITOT 0.3  PROT 6.5  ALBUMIN 3.7   Recent Labs  Lab 01/07/23 2229  LIPASE 29   No results for input(s): "AMMONIA" in the last 168 hours. Coagulation Profile: No results for input(s): "INR", "PROTIME" in the last 168 hours. Cardiac Enzymes: No results for input(s): "CKTOTAL", "CKMB", "CKMBINDEX", "TROPONINI" in the last 168 hours. BNP (last 3 results) No results for input(s): "PROBNP" in the last 8760 hours. HbA1C: No results for input(s): "HGBA1C" in the last 72 hours. CBG: No results for input(s): "GLUCAP" in the last 168 hours. Lipid Profile: No results for input(s): "CHOL", "HDL", "LDLCALC", "TRIG", "CHOLHDL",  "LDLDIRECT" in the last 72 hours. Thyroid Function Tests: Recent Labs    01/07/23 2229  TSH 4.329   Anemia Panel: No results for input(s): "VITAMINB12", "FOLATE", "FERRITIN", "TIBC", "IRON", "RETICCTPCT" in the last 72 hours. Urine analysis:    Component Value Date/Time   COLORURINE COLORLESS (A) 09/08/2022 1927   APPEARANCEUR HAZY (A) 09/08/2022 1927   APPEARANCEUR Clear 04/19/2021 1101   LABSPEC <1.005 (L) 09/08/2022 1927   PHURINE 5.5 09/08/2022 1927   GLUCOSEU NEGATIVE 09/08/2022 1927   GLUCOSEU NEGATIVE 10/26/2019 1504   HGBUR NEGATIVE 09/08/2022 1927   HGBUR negative 04/28/2010 1559   BILIRUBINUR NEGATIVE 09/08/2022 1927   BILIRUBINUR negative 08/22/2021 1638   BILIRUBINUR Negative 04/19/2021 1101   KETONESUR NEGATIVE 09/08/2022 1927   PROTEINUR NEGATIVE 09/08/2022 1927   UROBILINOGEN 0.2 08/22/2021 1638   UROBILINOGEN 0.2 10/26/2019 1504   NITRITE NEGATIVE 09/08/2022 1927   LEUKOCYTESUR MODERATE (A) 09/08/2022 1927    Radiological Exams on Admission: CT Angio Chest Pulmonary Embolism (PE) W or WO Contrast  Result Date: 01/08/2023 CLINICAL DATA:  Pulmonary embolism (PE) suspected, high prob. Palpitations EXAM: CT ANGIOGRAPHY CHEST WITH CONTRAST TECHNIQUE: Multidetector CT imaging of the chest was performed using the standard protocol during bolus administration of intravenous contrast. Multiplanar CT image reconstructions and MIPs were obtained to evaluate the vascular anatomy. RADIATION DOSE REDUCTION: This exam was performed according to the departmental dose-optimization program which includes automated exposure control, adjustment of the mA and/or kV according to patient size and/or use of iterative reconstruction technique. CONTRAST:  75mL OMNIPAQUE IOHEXOL 350 MG/ML SOLN COMPARISON:  10/31/2021 FINDINGS: Cardiovascular: Heart is normal size. Aorta is normal caliber. No filling defects in the pulmonary arteries to suggest pulmonary emboli. Few scattered aortic  calcifications. Mediastinum/Nodes: No mediastinal, hilar, or axillary adenopathy. Trachea and esophagus are unremarkable. Thyroid unremarkable. Lungs/Pleura: Mild centrilobular emphysema. No confluent opacities or effusions. Upper Abdomen: No acute findings Musculoskeletal: Chest wall soft tissues are unremarkable. No acute bony abnormality. Review of the MIP images confirms the above findings. IMPRESSION: No evidence of pulmonary embolus. No acute cardiopulmonary disease. Aortic Atherosclerosis (ICD10-I70.0). Electronically Signed   By: Charlett Nose M.D.   On: 01/08/2023 00:54   DG Chest 2 View  Result Date: 01/07/2023 CLINICAL DATA:  Palpitations. Fatigue. EXAM: CHEST - 2 VIEW COMPARISON:  Radiograph 10/10/2021, CT 10/31/2021 FINDINGS: The emphysematous changes on prior CT are not well demonstrated by radiograph.The cardiomediastinal contours are normal. The lungs are clear. Pulmonary vasculature is normal. No consolidation, pleural effusion, or pneumothorax. No acute osseous abnormalities are seen. IMPRESSION: No acute chest findings. Electronically Signed   By: Narda Rutherford M.D.   On: 01/07/2023 20:00      Assessment/Plan   Principal Problem:   Chest pain Active Problems:   RHINITIS, ALLERGIC   COPD (chronic obstructive pulmonary disease)   GERD (gastroesophageal reflux disease)   Acute hyponatremia   Hyperlipidemia      #) Atypical Chest Pain: 2 weeks of nonradiating substernal pressure, that is nonexertional and resolved spontaneously in the absence of any nitroglycerin, which appears to be atypical for ACS; however, given the presence of some typical characteristics in this patient with multiple CAD risk factors for progression of a documented history of known underlying coronary disease, along with progression of the patient's chest pain in terms of frequency and associated intensity, rendering a differential includes unstable angina, and being admitted for observation for further  evaluation management of presenting chest pain, will analogy and that she is currently chest pain-free.  Of note, Troponin x 2 negative, EKG shows no evidence of acute ischemic changes, including no evidence of STEMI, and CXR showed no acute CP process, including no evidence of pneumothorax.  Additionally, CTA chest showed no evidence of acute cardiopulmonary process, including no evidence of pulmonary embolism.  Aside from ACS, differential includes acid reflux, given a documented history of GERD versus esophagitis versus gastritis versus esophageal spasm.  Of note, lipase nonelevated.  Additionally, given a very remote distant history of recreational drug use, also check urinary drug screen, differential also including coronary vasospasm.  EDP at [facility] discussed patient's case with on-call [facility] cardiology, who will formally consult, with additional recommendations pending at this time.   Plan: trend serial troponin. Monitor on telemetry. PRN sublingual nitroglycerin. Consider prn Morphine for pain not relieved by SL NG. PRN EKG for subsequent episodes of chest pain. Check serum Mg level and repeat CMP in the morning, with prn supplementation to maintain Mg and potassium levels greater than or equal to 2.0 and 4.0, respectively, to further reduce risk of ventricular arrhythmia. Repeat CBC in the AM.  Echocardiogram ordered for the morning.  Cardiology to formally consult, as above.  Repeat lipase.  Urinary drug screen.  Resumed on Protonix.              #) Acute hypo-osmolar  hyponatremia: Presenting serum sodium of 133 relative to most recent prior value of 141 on 11/23/22, without any need for hypoglycemic related correction thereof.  Appears relatively euvolemic, and CTA  chest shows no evidence of acute cardiopulmonary process, as further detailed above.  Clinically and radiographically, presentation appears less suggestive of acutely decompensated heart failure, and BNP  noted to be nonelevated, while CTA chest shows no evidence of pulmonary edema.  No overt pharmacologic influences.  Will expand laboratory evaluation for patient's acute hyponatremia, as further detailed below, including evaluation for SIADH.  As noted above, presenting TSH found to be within normal limits.    Plan: monitor strict I's and O's and daily weights.  check UA, random urine sodium, urine osmolality.  Check serum osmolality to confirm suspected hypoosmolar etiology.  Repeat CMP in the morning. Check serum uric acid level, as SIADH can be associated with hypouricemia due to hyperuricuria.              #) COPD: Documented history thereof, without clinical evidence of acute exacerbation at this time.   Outpatient respiratory regimen includes the following: Trelegy.   Plan: cont outpatient Trelegy. Prn albuterol nebulizer. Check CMP and serum magnesium level in the AM.                #) GERD: documented h/o such; on Protonix as outpatient.  History of GERD is notable in the context of presenting intermittent chest pain over the last several weeks.  Plan: continue home PPI.                #) Hyperlipidemia: documented h/o such. On Zetia as outpatient.   Plan: continue home Zetia.                #) Allergic Rhinitis: documented h/o such, on scheduled Allegra as well as prn intranasal Nasonex as outpatient.  In the context of presenting progressive chest pain, will refrain from scheduled anticholinergics, including scheduled Allegra.  Rather, we will change, for now, her outpatient intra nasal corticosteroid to scheduled.   Plan: cont home Nasonex, but on a scheduled basis.  Hold home Allegra for now, as further detailed above.     DVT prophylaxis: SCD's   Code Status: Full code Family Communication: none Disposition Plan: Per Rounding Team Consults called: EDP at [facility] discussed patient's case with the on-call [facility]  cardiologist, who will formally consult, as further detailed above;  Admission status: Observation     I SPENT GREATER THAN 75  MINUTES IN CLINICAL CARE TIME/MEDICAL DECISION-MAKING IN COMPLETING THIS ADMISSION.      Chaney Born Leisel Pinette DO [facility]  From 7PM - 7AM   01/08/2023, 10:59 PM

## 2023-01-08 NOTE — ED Notes (Signed)
[facility] called for transport. 

## 2023-01-09 ENCOUNTER — Observation Stay (HOSPITAL_BASED_OUTPATIENT_CLINIC_OR_DEPARTMENT_OTHER): Payer: 59

## 2023-01-09 DIAGNOSIS — R079 Chest pain, unspecified: Secondary | ICD-10-CM

## 2023-01-09 LAB — COMPREHENSIVE METABOLIC PANEL
ALT: 18 U/L (ref 0–44)
AST: 20 U/L (ref 15–41)
Albumin: 3.3 g/dL — ABNORMAL LOW (ref 3.5–5.0)
Alkaline Phosphatase: 71 U/L (ref 38–126)
Anion gap: 10 (ref 5–15)
BUN: 18 mg/dL (ref 8–23)
CO2: 24 mmol/L (ref 22–32)
Calcium: 9.5 mg/dL (ref 8.9–10.3)
Chloride: 101 mmol/L (ref 98–111)
Creatinine, Ser: 0.9 mg/dL (ref 0.44–1.00)
GFR, Estimated: >60 mL/min (ref >60)
Glucose, Bld: 99 mg/dL (ref 70–99)
Potassium: 4.4 mmol/L (ref 3.5–5.1)
Sodium: 135 mmol/L (ref 135–145)
Total Bilirubin: 0.6 mg/dL (ref 0.3–1.2)
Total Protein: 5.8 g/dL — ABNORMAL LOW (ref 6.5–8.1)

## 2023-01-09 LAB — CBC WITH DIFFERENTIAL/PLATELET
Abs Immature Granulocytes: 0.02 10*3/uL (ref 0.00–0.07)
Basophils Absolute: 0 10*3/uL (ref 0.0–0.1)
Basophils Relative: 1 %
Eosinophils Absolute: 0.1 10*3/uL (ref 0.0–0.5)
Eosinophils Relative: 2 %
HCT: 38.7 % (ref 36.0–46.0)
Hemoglobin: 13 g/dL (ref 12.0–15.0)
Immature Granulocytes: 1 %
Lymphocytes Relative: 26 %
Lymphs Abs: 0.9 10*3/uL (ref 0.7–4.0)
MCH: 27.5 pg (ref 26.0–34.0)
MCHC: 33.6 g/dL (ref 30.0–36.0)
MCV: 82 fL (ref 80.0–100.0)
Monocytes Absolute: 0.4 10*3/uL (ref 0.1–1.0)
Monocytes Relative: 12 %
Neutro Abs: 2 10*3/uL (ref 1.7–7.7)
Neutrophils Relative %: 58 %
Platelets: 273 10*3/uL (ref 150–400)
RBC: 4.72 MIL/uL (ref 3.87–5.11)
RDW: 12.6 % (ref 11.5–15.5)
WBC: 3.4 10*3/uL — ABNORMAL LOW (ref 4.0–10.5)
nRBC: 0 % (ref 0.0–0.2)

## 2023-01-09 LAB — LIPASE, BLOOD: Lipase: 28 U/L (ref 11–51)

## 2023-01-09 LAB — MAGNESIUM: Magnesium: 2.3 mg/dL (ref 1.7–2.4)

## 2023-01-09 LAB — ECHOCARDIOGRAM COMPLETE
Area-P 1/2: 3.24 cm2
Height: 63 in
MV M vel: 5.14 m/s
MV Peak grad: 105.7 mmHg
MV VTI: 2.72 cm2
Radius: 0.4 cm
S' Lateral: 2.4 cm
Weight: 2289.61 oz

## 2023-01-09 LAB — URIC ACID: Uric Acid, Serum: 4.8 mg/dL (ref 2.5–7.1)

## 2023-01-09 LAB — TROPONIN I (HIGH SENSITIVITY): Troponin I (High Sensitivity): 3 ng/L (ref <18)

## 2023-01-09 LAB — OSMOLALITY: Osmolality: 288 mOsm/kg (ref 275–295)

## 2023-01-09 MED ORDER — PANTOPRAZOLE SODIUM 40 MG PO TBEC
40.0000 mg | DELAYED_RELEASE_TABLET | Freq: Two times a day (BID) | ORAL | Status: DC
  Administered 2023-01-09 – 2023-01-10 (×4): 40 mg via ORAL
  Filled 2023-01-09 (×4): qty 1

## 2023-01-09 MED ORDER — ALUM & MAG HYDROXIDE-SIMETH 200-200-20 MG/5ML PO SUSP
15.0000 mL | Freq: Four times a day (QID) | ORAL | Status: AC | PRN
  Administered 2023-01-09 (×4): 15 mL via ORAL
  Filled 2023-01-09 (×4): qty 30

## 2023-01-09 NOTE — Plan of Care (Signed)

## 2023-01-09 NOTE — Care Management (Signed)
  Transition of Care [facility]) Screening Note   Patient Details  Name: Aimee Davis Date of Birth: 1955-11-20   Transition of Care [facility]) CM/SW Contact:    Gala Lewandowsky, RN Phone Number: 01/09/2023, 2:16 PM    Transition of Care Department [facility]) has reviewed the patient and no TOC needs have been identified at this time. Patient presented for chest pain. We will continue to monitor patient advancement through interdisciplinary progression rounds. If new patient transition needs arise, please place a TOC consult.

## 2023-01-09 NOTE — Progress Notes (Signed)
  Echocardiogram 2D Echocardiogram has been performed.  Aimee Davis 01/09/2023, 12:10 PM

## 2023-01-09 NOTE — Progress Notes (Addendum)
PROGRESS NOTE    SHIRL WEIR  ZOX:[id number] DOB: April 11, 1956 DOA: 01/07/2023 PCP: Steffanie Rainwater, MD    Brief Narrative:  This 67 yrs old female with medical history significant for COPD, GERD, hyperlipidemia, allergic rhinitis, who presented in the ED with c/o : Intermittent chest pain for 3 to 4 weeks.  Patient describes chest pain as substernal,  non radiating non exertional and they resolved spontaneously within minutes of onset without any nitroglycerin.  She reported associated shortness of breath and palpitations.  Patient reports history of coronary artery disease and follows up with cardiology as an outpatient.  She underwent Zio patch as an outpatient which reportedly showed 2 runs of SVT.  Patient was started on Lopressor as an outpatient.  Workup in the ED : EKG nonspecific ST-T wave changes.  CTA chest ruled out acute cardiopulmonary process.  Patient is admitted for further evaluation.  Assessment & Plan:   Principal Problem:   Chest pain Active Problems:   RHINITIS, ALLERGIC   COPD (chronic obstructive pulmonary disease)   GERD (gastroesophageal reflux disease)   Acute hyponatremia   Hyperlipidemia  Chest pain likely atypical: Presented with intermittent non radiating non exertional chest pain for last 2 weeks that resolves spontaneously within minutes.  Troponin x 2 negative.  EKG nonspecific ST-T wave changes CTA ruled out acute cardiopulmonary process. Other differentials could include GERD, esophageal spasm. Obtain echocardiogram. Cardiology consulted, Awaiting recommendation.  Hyponatremia: Resolved.  COPD: Does not appear like an acute exacerbation. Continue outpatient trilogy.Continue albuterol inhaler.  GERD: Continue pantoprazole  Hyperlipidemia: Continue Zetia  Allergic rhinitis: Continue Nasonex, Allegra.    DVT prophylaxis:Lovenox Code Status: DNR Family Communication:No family at bed side. Disposition Plan:    Status is:  Observation The patient remains OBS appropriate and will d/c before 2 midnights.  Admitted for chest pain, echo was ordered report pending   Consultants:  Cardiology  Procedures: Echocardiogram  Antimicrobials: None  Subjective: Patient was seen and examined at bedside.  Overnight events noted. Patient describes chest pain is fluttering, denies any specific chest pain.  Objective: Vitals:   01/09/23 0404 01/09/23 0712 01/09/23 0838 01/09/23 1138  BP: 115/67 111/61  119/63  Pulse: (!) 59 (!) 57  65  Resp: 18 18  17   Temp: 98.6 F (37 C) 97.8 F (36.6 C)    TempSrc: Oral Oral    SpO2: 98% 98% 98% 98%  Weight: 64.9 kg     Height:       No intake or output data in the 24 hours ending 01/09/23 1615 Filed Weights   01/07/23 1939 01/08/23 2207 01/09/23 0404  Weight: 68 kg 64.9 kg 64.9 kg    Examination:  General exam: Appears calm and comfortable,  deconditioned Respiratory system: Clear to auscultation. Respiratory effort normal. RR 12 Cardiovascular system: S1 & S2 heard, RRR. No JVD, murmurs, rubs, gallops or clicks. No pedal edema. Gastrointestinal system: Abdomen is soft, non tender, non distended, BS+ Central nervous system: Alert and oriented x 3. No focal neurological deficits. Extremities: No edema, no cyanosis, no clubbing Skin: No rashes, lesions or ulcers Psychiatry: Judgement and insight appear normal. Mood & affect appropriate.     Data Reviewed: I have personally reviewed following labs and imaging studies  CBC: Recent Labs  Lab 01/07/23 1947 01/09/23 0159  WBC 3.6* 3.4*  NEUTROABS  --  2.0  HGB 13.2 13.0  HCT 40.7 38.7  MCV 82.9 82.0  PLT 301 273   Basic Metabolic Panel: Recent Labs  Lab 01/07/23 1947 01/07/23 2229 01/09/23 0159  NA 133*  --  135  K 4.1  --  4.4  CL 100  --  101  CO2 25  --  24  GLUCOSE 110*  --  99  BUN 23  --  18  CREATININE 1.09*  --  0.90  CALCIUM 9.6  --  9.5  MG  --  2.3 2.3   GFR: Estimated Creatinine  Clearance: 54.2 mL/min (by C-G formula based on SCr of 0.9 mg/dL). Liver Function Tests: Recent Labs  Lab 01/07/23 2229 01/09/23 0159  AST 23 20  ALT 19 18  ALKPHOS 83 71  BILITOT 0.3 0.6  PROT 6.5 5.8*  ALBUMIN 3.7 3.3*   Recent Labs  Lab 01/07/23 2229 01/09/23 0159  LIPASE 29 28   No results for input(s): "AMMONIA" in the last 168 hours. Coagulation Profile: No results for input(s): "INR", "PROTIME" in the last 168 hours. Cardiac Enzymes: No results for input(s): "CKTOTAL", "CKMB", "CKMBINDEX", "TROPONINI" in the last 168 hours. BNP (last 3 results) No results for input(s): "PROBNP" in the last 8760 hours. HbA1C: No results for input(s): "HGBA1C" in the last 72 hours. CBG: No results for input(s): "GLUCAP" in the last 168 hours. Lipid Profile: No results for input(s): "CHOL", "HDL", "LDLCALC", "TRIG", "CHOLHDL", "LDLDIRECT" in the last 72 hours. Thyroid Function Tests: Recent Labs    01/07/23 2229  TSH 4.329   Anemia Panel: No results for input(s): "VITAMINB12", "FOLATE", "FERRITIN", "TIBC", "IRON", "RETICCTPCT" in the last 72 hours. Sepsis Labs: No results for input(s): "PROCALCITON", "LATICACIDVEN" in the last 168 hours.  No results found for this or any previous visit (from the past 240 hour(s)).       Radiology Studies: ECHOCARDIOGRAM COMPLETE  Result Date: 01/09/2023    ECHOCARDIOGRAM REPORT   Patient Name:   MOZEL BURDETT Date of Exam: 01/09/2023 Medical Rec #:  [id number]       Height:       63.0 in Accession #:    [id number]      Weight:       143.1 lb Date of Birth:  Apr 16, 1956      BSA:          1.677 m Patient Age:    67 years        BP:           119/63 mmHg Patient Gender: F               HR:           64 bpm. Exam Location:  Inpatient Procedure: 2D Echo, Cardiac Doppler, Color Doppler and 3D Echo Indications:    Chest pain  History:        Patient has prior history of Echocardiogram examinations, most                 recent 12/20/2020. COPD,  Signs/Symptoms:Chest Pain; Risk                 Factors:Dyslipidemia.  Sonographer:    Milda Smart Referring Phys: [id number] Angie Fava  Sonographer Comments: Image acquisition challenging due to respiratory motion. IMPRESSIONS  1. Left ventricular ejection fraction, by estimation, is 55 to 60%. Left ventricular ejection fraction by 3D volume is 57 %. The left ventricle has normal function. The left ventricle has no regional wall motion abnormalities. Left ventricular diastolic  parameters were normal.  2. Right ventricular systolic function is normal. The right ventricular size is normal.  There is normal pulmonary artery systolic pressure. The estimated right ventricular systolic pressure is 25.1 mmHg.  3. The mitral valve is normal in structure. Mild mitral valve regurgitation. No evidence of mitral stenosis.  4. The aortic valve is tricuspid. Aortic valve regurgitation is not visualized. Aortic valve sclerosis is present, with no evidence of aortic valve stenosis.  5. The inferior vena cava is normal in size with greater than 50% respiratory variability, suggesting right atrial pressure of 3 mmHg.  6. Possible trivial circumferential pericardial effusion is present. FINDINGS  Left Ventricle: Left ventricular ejection fraction, by estimation, is 55 to 60%. Left ventricular ejection fraction by 3D volume is 57 %. The left ventricle has normal function. The left ventricle has no regional wall motion abnormalities. The left ventricular internal cavity size was normal in size. There is no left ventricular hypertrophy. Left ventricular diastolic parameters were normal. Normal left ventricular filling pressure. Right Ventricle: The right ventricular size is normal. No increase in right ventricular wall thickness. Right ventricular systolic function is normal. There is normal pulmonary artery systolic pressure. The tricuspid regurgitant velocity is 2.35 m/s, and  with an assumed right atrial pressure of 3 mmHg,  the estimated right ventricular systolic pressure is 25.1 mmHg. Left Atrium: Left atrial size was normal in size. Right Atrium: Right atrial size was normal in size. Pericardium: Trivial pericardial effusion is present. The pericardial effusion is circumferential. Mitral Valve: The mitral valve is normal in structure. Mild mitral valve regurgitation. No evidence of mitral valve stenosis. MV peak gradient, 5.3 mmHg. The mean mitral valve gradient is 3.0 mmHg. Tricuspid Valve: The tricuspid valve is normal in structure. Tricuspid valve regurgitation is mild . No evidence of tricuspid stenosis. Aortic Valve: The aortic valve is tricuspid. Aortic valve regurgitation is not visualized. Aortic valve sclerosis is present, with no evidence of aortic valve stenosis. Pulmonic Valve: The pulmonic valve was normal in structure. Pulmonic valve regurgitation is mild. No evidence of pulmonic stenosis. Aorta: The aortic root is normal in size and structure. Venous: The inferior vena cava is normal in size with greater than 50% respiratory variability, suggesting right atrial pressure of 3 mmHg. IAS/Shunts: No atrial level shunt detected by color flow Doppler.  LEFT VENTRICLE PLAX 2D LVIDd:         3.70 cm         Diastology LVIDs:         2.40 cm         LV e' medial:    7.72 cm/s LV PW:         0.80 cm         LV E/e' medial:  13.1 LV IVS:        0.90 cm         LV e' lateral:   9.36 cm/s LVOT diam:     2.20 cm         LV E/e' lateral: 10.8 LV SV:         104 LV SV Index:   62 LVOT Area:     3.80 cm        3D Volume EF                                LV 3D EF:    Left  ventricul                                             ar                                             ejection                                             fraction                                             by 3D                                             volume is                                             57 %.                                  3D Volume EF:                                3D EF:        57 %                                LV EDV:       98 ml                                LV ESV:       42 ml                                LV SV:        56 ml RIGHT VENTRICLE             IVC RV S prime:     12.40 cm/s  IVC diam: 1.80 cm TAPSE (M-mode): 1.9 cm LEFT ATRIUM             Index        RIGHT ATRIUM           Index LA diam:        2.90 cm 1.73 cm/m   RA Area:     10.80 cm LA Vol (A2C):   36.1 ml 21.52 ml/m  RA Volume:   23.00 ml  13.71 ml/m LA Vol (A4C):   25.6 ml 15.26 ml/m LA Biplane Vol: 30.8 ml  18.36 ml/m  AORTIC VALVE             PULMONIC VALVE LVOT Vmax:   145.00 cm/s PR End Diast Vel: 3.11 msec LVOT Vmean:  86.500 cm/s LVOT VTI:    0.273 m  AORTA Ao Root diam: 3.40 cm Ao Asc diam:  3.20 cm MITRAL VALVE                  TRICUSPID VALVE MV Area (PHT): 3.24 cm       TR Peak grad:   22.1 mmHg MV Area VTI:   2.72 cm       TR Mean grad:   17.0 mmHg MV Peak grad:  5.3 mmHg       TR Vmax:        235.00 cm/s MV Mean grad:  3.0 mmHg       TR Vmean:       198.0 cm/s MV Vmax:       1.15 m/s MV Vmean:      76.6 cm/s      SHUNTS MV Decel Time: 234 msec       Systemic VTI:  0.27 m MR Peak grad:    105.7 mmHg   Systemic Diam: 2.20 cm MR Mean grad:    77.0 mmHg MR Vmax:         514.00 cm/s MR Vmean:        425.5 cm/s MR PISA:         1.01 cm MR PISA Eff ROA: 8 mm MR PISA Radius:  0.40 cm MV E velocity: 101.00 cm/s MV A velocity: 78.60 cm/s MV E/A ratio:  1.28 Armanda Magic MD Electronically signed by Armanda Magic MD Signature Date/Time: 01/09/2023/12:22:20 PM    Final    CT Angio Chest Pulmonary Embolism (PE) W or WO Contrast  Result Date: 01/08/2023 CLINICAL DATA:  Pulmonary embolism (PE) suspected, high prob. Palpitations EXAM: CT ANGIOGRAPHY CHEST WITH CONTRAST TECHNIQUE: Multidetector CT imaging of the chest was performed using the standard protocol during bolus administration of intravenous contrast. Multiplanar CT image  reconstructions and MIPs were obtained to evaluate the vascular anatomy. RADIATION DOSE REDUCTION: This exam was performed according to the departmental dose-optimization program which includes automated exposure control, adjustment of the mA and/or kV according to patient size and/or use of iterative reconstruction technique. CONTRAST:  75mL OMNIPAQUE IOHEXOL 350 MG/ML SOLN COMPARISON:  10/31/2021 FINDINGS: Cardiovascular: Heart is normal size. Aorta is normal caliber. No filling defects in the pulmonary arteries to suggest pulmonary emboli. Few scattered aortic calcifications. Mediastinum/Nodes: No mediastinal, hilar, or axillary adenopathy. Trachea and esophagus are unremarkable. Thyroid unremarkable. Lungs/Pleura: Mild centrilobular emphysema. No confluent opacities or effusions. Upper Abdomen: No acute findings Musculoskeletal: Chest wall soft tissues are unremarkable. No acute bony abnormality. Review of the MIP images confirms the above findings. IMPRESSION: No evidence of pulmonary embolus. No acute cardiopulmonary disease. Aortic Atherosclerosis (ICD10-I70.0). Electronically Signed   By: Charlett Nose M.D.   On: 01/08/2023 00:54   DG Chest 2 View  Result Date: 01/07/2023 CLINICAL DATA:  Palpitations. Fatigue. EXAM: CHEST - 2 VIEW COMPARISON:  Radiograph 10/10/2021, CT 10/31/2021 FINDINGS: The emphysematous changes on prior CT are not well demonstrated by radiograph.The cardiomediastinal contours are normal. The lungs are clear. Pulmonary vasculature is normal. No consolidation, pleural effusion, or pneumothorax. No acute osseous abnormalities are seen. IMPRESSION: No acute chest findings. Electronically Signed   By: Narda Rutherford M.D.   On: 01/07/2023 20:00    Scheduled Meds:  aspirin EC  81 mg Oral Daily   ezetimibe  10 mg Oral Daily   fluticasone  1 spray Each Nare Daily   fluticasone furoate-vilanterol  1 puff Inhalation Daily   And   umeclidinium bromide  1 puff Inhalation Daily    pantoprazole  40 mg Oral BID AC   Continuous Infusions:   LOS: 0 days    Time spent: 50 mins    Willeen Niece, MD [facility]   If 7PM-7AM, please contact night-coverage

## 2023-01-09 NOTE — Care Management Obs Status (Signed)
MEDICARE OBSERVATION STATUS NOTIFICATION   Patient Details  Name: Aimee Davis MRN: [id number] Date of Birth: [birth year]   Medicare Observation Status Notification Given:  Yes    Lawerance Sabal, RN 01/09/2023, 1:56 PM

## 2023-01-10 ENCOUNTER — Other Ambulatory Visit (HOSPITAL_COMMUNITY): Payer: Self-pay

## 2023-01-10 DIAGNOSIS — R079 Chest pain, unspecified: Secondary | ICD-10-CM | POA: Diagnosis not present

## 2023-01-10 DIAGNOSIS — R002 Palpitations: Secondary | ICD-10-CM

## 2023-01-10 MED ORDER — PANTOPRAZOLE SODIUM 40 MG PO TBEC
40.0000 mg | DELAYED_RELEASE_TABLET | Freq: Two times a day (BID) | ORAL | 0 refills | Status: DC
  Filled 2023-01-10: qty 30, 15d supply, fill #0

## 2023-01-10 NOTE — Discharge Instructions (Signed)
Advised to follow-up with primary care physician in 1 week. Advised to continue current medications.

## 2023-01-10 NOTE — Discharge Summary (Signed)
Physician Discharge Summary  Aimee Davis XBJ:[id number] DOB: November 01, 1955 DOA: 01/07/2023  PCP: Steffanie Rainwater, MD  Admit date: 01/07/2023  Discharge date: 01/10/2023  Admitted From: Home.  Disposition:  Home.  Recommendations for Outpatient Follow-up:  Follow up with PCP in 1-2 weeks Please obtain BMP/CBC in one week Advised to take metoprolol as needed for palpitations.  Home Health: None.  Equipment/Devices:None  Discharge Condition: Stable CODE STATUS:DNR Diet recommendation: Heart Healthy   Brief Summary/ Hospital Course: This 67 yrs old female with medical history significant for COPD, GERD, hyperlipidemia, allergic rhinitis, who presented in the ED with c/o : Intermittent chest pain for 3 to 4 weeks.  Patient describes chest pain as substernal,  non radiating,  non exertional and they resolved spontaneously within minutes of onset without any nitroglycerin.  She reported associated shortness of breath and palpitations. Patient reports history of coronary artery disease and follows up with cardiology as an outpatient.  She underwent Zio patch as an outpatient which reportedly showed 2 runs of SVT.  Patient was started on Lopressor as an outpatient. Patient hasn't started metoprolol yet.  Workup in the ED EKG nonspecific ST-T wave changes.  CTA chest ruled out acute cardiopulmonary process.  Patient is admitted for further evaluation.  Cardiology was consulted.  Echocardiogram unremarkable.  Cardiology recommended no further ischemic workup.  Patient is being discharged home.  Discharge Diagnoses:  Principal Problem:   Chest pain Active Problems:   RHINITIS, ALLERGIC   COPD (chronic obstructive pulmonary disease)   GERD (gastroesophageal reflux disease)   Acute hyponatremia   Hyperlipidemia  Chest pain likely atypical: Presented with intermittent non radiating non exertional chest pain for last 2 weeks that resolves spontaneously within minutes.   Troponin x 2  negative.  EKG nonspecific ST-T wave changes CTA ruled out acute cardiopulmonary process. Other differentials could include GERD, esophageal spasm. Echo wnl. Cardiology consulted, No further workup needed.   Hyponatremia: Resolved.   COPD: Does not appear like an acute exacerbation. Continue outpatient trilogy.Continue albuterol inhaler.   GERD: Continue pantoprazole   Hyperlipidemia: Continue Zetia   Allergic rhinitis: Continue Nasonex, Allegra.  Discharge Instructions  Discharge Instructions     Call MD for:  difficulty breathing, headache or visual disturbances   Complete by: As directed    Call MD for:  persistant dizziness or light-headedness   Complete by: As directed    Call MD for:  persistant nausea and vomiting   Complete by: As directed    Diet - low sodium heart healthy   Complete by: As directed    Diet Carb Modified   Complete by: As directed    Discharge instructions   Complete by: As directed    Advised to follow-up with primary care physician in 1 week. Advised to continue current medications.   Increase activity slowly   Complete by: As directed       Allergies as of 01/10/2023       Reactions   Dicyclomine    confusion   Hydrocodone Other (See Comments)   Recovering addict X 29 years; no narcotics per patient   Lipitor [atorvastatin] Other (See Comments)   Pt reports causes upset stomach, cramps in legs, poor appetite, and sleep disturbance   Penicillins Other (See Comments)   REACTION: rash   Levsin [hyoscyamine] Anxiety        Medication List     STOP taking these medications    B-complex with vitamin C tablet   metoprolol tartrate 25  MG tablet Commonly known as: LOPRESSOR   OVER THE COUNTER MEDICATION       TAKE these medications    albuterol 108 (90 Base) MCG/ACT inhaler Commonly known as: VENTOLIN HFA USE 2 INHALATIONS BY MOUTH EVERY 6 HOURS AS NEEDED FOR WHEEZING  OR SHORTNESS OF BREATH What changed: See the new  instructions.   aspirin EC 81 MG tablet Take 1 tablet (81 mg total) by mouth daily. Swallow whole.   cholecalciferol 25 MCG (1000 UNIT) tablet Commonly known as: VITAMIN D3 Take 1 tablet (1,000 Units total) by mouth daily.   DRY EYES OP Apply 1 drop to eye daily as needed (dry eyes).   ezetimibe 10 MG tablet Commonly known as: ZETIA Take 10 mg by mouth daily.   fexofenadine 180 MG tablet Commonly known as: ALLEGRA Take 1 tablet (180 mg total) by mouth daily.   mometasone 50 MCG/ACT nasal spray Commonly known as: Nasonex PLACE 2 SPRAYS IN EACH NOSTRIL ONCE DAILY AS NEEDED. What changed:  how much to take how to take this when to take this additional instructions   multivitamin with minerals tablet Take 1 tablet by mouth daily.   pantoprazole 40 MG tablet Commonly known as: PROTONIX Take 1 tablet (40 mg total) by mouth 2 (two) times daily before a meal for 15 days. What changed:  how much to take how to take this when to take this additional instructions   Trelegy Ellipta 200-62.5-25 MCG/ACT Aepb Generic drug: Fluticasone-Umeclidin-Vilant USE 1 INHALATION BY MOUTH ONCE  DAILY What changed:  how much to take how to take this when to take this reasons to take this additional instructions   vitamin C 100 MG tablet Take 50 mg by mouth daily.        Follow-up Information     Steffanie Rainwater, MD Follow up in 1 week(s).   Specialty: Internal Medicine Contact information: [address] [place] [address] [phone]                Allergies  Allergen Reactions   Dicyclomine     confusion   Hydrocodone Other (See Comments)    Recovering addict X 29 years; no narcotics per patient   Lipitor [Atorvastatin] Other (See Comments)    Pt reports causes upset stomach, cramps in legs, poor appetite, and sleep disturbance   Penicillins Other (See Comments)    REACTION: rash   Levsin [Hyoscyamine] Anxiety     Consultations: Cardiology   Procedures/Studies: ECHOCARDIOGRAM COMPLETE  Result Date: 01/09/2023    ECHOCARDIOGRAM REPORT   Patient Name:   Aimee Davis Date of Exam: 01/09/2023 Medical Rec #:  [id number]       Height:       63.0 in Accession #:    [id number]      Weight:       143.1 lb Date of Birth:  [birth year]      BSA:          1.677 m Patient Age:    66 years        BP:           119/63 mmHg Patient Gender: F               HR:           64 bpm. Exam Location:  Inpatient Procedure: 2D Echo, Cardiac Doppler, Color Doppler and 3D Echo Indications:    Chest pain  History:        Patient has  prior history of Echocardiogram examinations, most                 recent 12/20/2020. COPD, Signs/Symptoms:Chest Pain; Risk                 Factors:Dyslipidemia.  Sonographer:    Milda Smart Referring Phys: [id number] Angie Fava  Sonographer Comments: Image acquisition challenging due to respiratory motion. IMPRESSIONS  1. Left ventricular ejection fraction, by estimation, is 55 to 60%. Left ventricular ejection fraction by 3D volume is 57 %. The left ventricle has normal function. The left ventricle has no regional wall motion abnormalities. Left ventricular diastolic  parameters were normal.  2. Right ventricular systolic function is normal. The right ventricular size is normal. There is normal pulmonary artery systolic pressure. The estimated right ventricular systolic pressure is 25.1 mmHg.  3. The mitral valve is normal in structure. Mild mitral valve regurgitation. No evidence of mitral stenosis.  4. The aortic valve is tricuspid. Aortic valve regurgitation is not visualized. Aortic valve sclerosis is present, with no evidence of aortic valve stenosis.  5. The inferior vena cava is normal in size with greater than 50% respiratory variability, suggesting right atrial pressure of 3 mmHg.  6. Possible trivial circumferential pericardial effusion is present. FINDINGS  Left Ventricle: Left ventricular  ejection fraction, by estimation, is 55 to 60%. Left ventricular ejection fraction by 3D volume is 57 %. The left ventricle has normal function. The left ventricle has no regional wall motion abnormalities. The left ventricular internal cavity size was normal in size. There is no left ventricular hypertrophy. Left ventricular diastolic parameters were normal. Normal left ventricular filling pressure. Right Ventricle: The right ventricular size is normal. No increase in right ventricular wall thickness. Right ventricular systolic function is normal. There is normal pulmonary artery systolic pressure. The tricuspid regurgitant velocity is 2.35 m/s, and  with an assumed right atrial pressure of 3 mmHg, the estimated right ventricular systolic pressure is 25.1 mmHg. Left Atrium: Left atrial size was normal in size. Right Atrium: Right atrial size was normal in size. Pericardium: Trivial pericardial effusion is present. The pericardial effusion is circumferential. Mitral Valve: The mitral valve is normal in structure. Mild mitral valve regurgitation. No evidence of mitral valve stenosis. MV peak gradient, 5.3 mmHg. The mean mitral valve gradient is 3.0 mmHg. Tricuspid Valve: The tricuspid valve is normal in structure. Tricuspid valve regurgitation is mild . No evidence of tricuspid stenosis. Aortic Valve: The aortic valve is tricuspid. Aortic valve regurgitation is not visualized. Aortic valve sclerosis is present, with no evidence of aortic valve stenosis. Pulmonic Valve: The pulmonic valve was normal in structure. Pulmonic valve regurgitation is mild. No evidence of pulmonic stenosis. Aorta: The aortic root is normal in size and structure. Venous: The inferior vena cava is normal in size with greater than 50% respiratory variability, suggesting right atrial pressure of 3 mmHg. IAS/Shunts: No atrial level shunt detected by color flow Doppler.  LEFT VENTRICLE PLAX 2D LVIDd:         3.70 cm         Diastology LVIDs:          2.40 cm         LV e' medial:    7.72 cm/s LV PW:         0.80 cm         LV E/e' medial:  13.1 LV IVS:        0.90 cm  LV e' lateral:   9.36 cm/s LVOT diam:     2.20 cm         LV E/e' lateral: 10.8 LV SV:         104 LV SV Index:   62 LVOT Area:     3.80 cm        3D Volume EF                                LV 3D EF:    Left                                             ventricul                                             ar                                             ejection                                             fraction                                             by 3D                                             volume is                                             57 %.                                 3D Volume EF:                                3D EF:        57 %                                LV EDV:       98 ml                                LV ESV:       42 ml  LV SV:        56 ml RIGHT VENTRICLE             IVC RV S prime:     12.40 cm/s  IVC diam: 1.80 cm TAPSE (M-mode): 1.9 cm LEFT ATRIUM             Index        RIGHT ATRIUM           Index LA diam:        2.90 cm 1.73 cm/m   RA Area:     10.80 cm LA Vol (A2C):   36.1 ml 21.52 ml/m  RA Volume:   23.00 ml  13.71 ml/m LA Vol (A4C):   25.6 ml 15.26 ml/m LA Biplane Vol: 30.8 ml 18.36 ml/m  AORTIC VALVE             PULMONIC VALVE LVOT Vmax:   145.00 cm/s PR End Diast Vel: 3.11 msec LVOT Vmean:  86.500 cm/s LVOT VTI:    0.273 m  AORTA Ao Root diam: 3.40 cm Ao Asc diam:  3.20 cm MITRAL VALVE                  TRICUSPID VALVE MV Area (PHT): 3.24 cm       TR Peak grad:   22.1 mmHg MV Area VTI:   2.72 cm       TR Mean grad:   17.0 mmHg MV Peak grad:  5.3 mmHg       TR Vmax:        235.00 cm/s MV Mean grad:  3.0 mmHg       TR Vmean:       198.0 cm/s MV Vmax:       1.15 m/s MV Vmean:      76.6 cm/s      SHUNTS MV Decel Time: 234 msec       Systemic VTI:  0.27 m MR Peak grad:    105.7 mmHg   Systemic Diam: 2.20 cm  MR Mean grad:    77.0 mmHg MR Vmax:         514.00 cm/s MR Vmean:        425.5 cm/s MR PISA:         1.01 cm MR PISA Eff ROA: 8 mm MR PISA Radius:  0.40 cm MV E velocity: 101.00 cm/s MV A velocity: 78.60 cm/s MV E/A ratio:  1.28 Armanda Magic MD Electronically signed by Armanda Magic MD Signature Date/Time: 01/09/2023/12:22:20 PM    Final    CT Angio Chest Pulmonary Embolism (PE) W or WO Contrast  Result Date: 01/08/2023 CLINICAL DATA:  Pulmonary embolism (PE) suspected, high prob. Palpitations EXAM: CT ANGIOGRAPHY CHEST WITH CONTRAST TECHNIQUE: Multidetector CT imaging of the chest was performed using the standard protocol during bolus administration of intravenous contrast. Multiplanar CT image reconstructions and MIPs were obtained to evaluate the vascular anatomy. RADIATION DOSE REDUCTION: This exam was performed according to the departmental dose-optimization program which includes automated exposure control, adjustment of the mA and/or kV according to patient size and/or use of iterative reconstruction technique. CONTRAST:  75mL OMNIPAQUE IOHEXOL 350 MG/ML SOLN COMPARISON:  10/31/2021 FINDINGS: Cardiovascular: Heart is normal size. Aorta is normal caliber. No filling defects in the pulmonary arteries to suggest pulmonary emboli. Few scattered aortic calcifications. Mediastinum/Nodes: No mediastinal, hilar, or axillary adenopathy. Trachea and esophagus are unremarkable. Thyroid unremarkable. Lungs/Pleura: Mild centrilobular emphysema. No confluent opacities or effusions. Upper Abdomen: No acute  findings Musculoskeletal: Chest wall soft tissues are unremarkable. No acute bony abnormality. Review of the MIP images confirms the above findings. IMPRESSION: No evidence of pulmonary embolus. No acute cardiopulmonary disease. Aortic Atherosclerosis (ICD10-I70.0). Electronically Signed   By: Charlett Nose M.D.   On: 01/08/2023 00:54   DG Chest 2 View  Result Date: 01/07/2023 CLINICAL DATA:  Palpitations. Fatigue.  EXAM: CHEST - 2 VIEW COMPARISON:  Radiograph 10/10/2021, CT 10/31/2021 FINDINGS: The emphysematous changes on prior CT are not well demonstrated by radiograph.The cardiomediastinal contours are normal. The lungs are clear. Pulmonary vasculature is normal. No consolidation, pleural effusion, or pneumothorax. No acute osseous abnormalities are seen. IMPRESSION: No acute chest findings. Electronically Signed   By: Narda Rutherford M.D.   On: 01/07/2023 20:00   MM 3D SCREEN BREAST BILATERAL  Result Date: 12/25/2022 CLINICAL DATA:  Screening. EXAM: DIGITAL SCREENING BILATERAL MAMMOGRAM WITH TOMOSYNTHESIS AND CAD TECHNIQUE: Bilateral screening digital craniocaudal and mediolateral oblique mammograms were obtained. Bilateral screening digital breast tomosynthesis was performed. The images were evaluated with computer-aided detection. COMPARISON:  Previous exam(s). ACR Breast Density Category b: There are scattered areas of fibroglandular density. FINDINGS: There are no findings suspicious for malignancy. IMPRESSION: No mammographic evidence of malignancy. A result letter of this screening mammogram will be mailed directly to the patient. RECOMMENDATION: Screening mammogram in one year. (Code:SM-B-01Y) BI-RADS CATEGORY  1: Negative. Electronically Signed   By: Sherian Rein M.D.   On: 12/25/2022 13:33     Subjective: Patient was seen and examined at bedside.  Overnight events noted.   Patient report doing much better,  denies any chest pain.  Patient is being discharged home.  Discharge Exam: Vitals:   01/09/23 2005 01/10/23 0338  BP: 126/64 128/67  Pulse: 72 65  Resp: 18 18  Temp: 97.7 F (36.5 C) 98.8 F (37.1 C)  SpO2:     Vitals:   01/09/23 0838 01/09/23 1138 01/09/23 2005 01/10/23 0338  BP:  119/63 126/64 128/67  Pulse:  65 72 65  Resp:  17 18 18   Temp:   97.7 F (36.5 C) 98.8 F (37.1 C)  TempSrc:   Oral Oral  SpO2: 98% 98%    Weight:    64.9 kg  Height:        General: Pt is alert,  awake, not in acute distress Cardiovascular: RRR, S1/S2 +, no rubs, no gallops Respiratory: CTA bilaterally, no wheezing, no rhonchi Abdominal: Soft, NT, ND, bowel sounds + Extremities: no edema, no cyanosis    The results of significant diagnostics from this hospitalization (including imaging, microbiology, ancillary and laboratory) are listed below for reference.     Microbiology: No results found for this or any previous visit (from the past 240 hour(s)).   Labs: BNP (last 3 results) Recent Labs    01/07/23 2229  BNP 14.5   Basic Metabolic Panel: Recent Labs  Lab 01/07/23 1947 01/07/23 2229 01/09/23 0159  NA 133*  --  135  K 4.1  --  4.4  CL 100  --  101  CO2 25  --  24  GLUCOSE 110*  --  99  BUN 23  --  18  CREATININE 1.09*  --  0.90  CALCIUM 9.6  --  9.5  MG  --  2.3 2.3   Liver Function Tests: Recent Labs  Lab 01/07/23 2229 01/09/23 0159  AST 23 20  ALT 19 18  ALKPHOS 83 71  BILITOT 0.3 0.6  PROT 6.5 5.8*  ALBUMIN 3.7  3.3*   Recent Labs  Lab 01/07/23 2229 01/09/23 0159  LIPASE 29 28   No results for input(s): "AMMONIA" in the last 168 hours. CBC: Recent Labs  Lab 01/07/23 1947 01/09/23 0159  WBC 3.6* 3.4*  NEUTROABS  --  2.0  HGB 13.2 13.0  HCT 40.7 38.7  MCV 82.9 82.0  PLT 301 273   Cardiac Enzymes: No results for input(s): "CKTOTAL", "CKMB", "CKMBINDEX", "TROPONINI" in the last 168 hours. BNP: Invalid input(s): "POCBNP" CBG: No results for input(s): "GLUCAP" in the last 168 hours. D-Dimer No results for input(s): "DDIMER" in the last 72 hours. Hgb A1c No results for input(s): "HGBA1C" in the last 72 hours. Lipid Profile No results for input(s): "CHOL", "HDL", "LDLCALC", "TRIG", "CHOLHDL", "LDLDIRECT" in the last 72 hours. Thyroid function studies Recent Labs    01/07/23 2229  TSH 4.329   Anemia work up No results for input(s): "VITAMINB12", "FOLATE", "FERRITIN", "TIBC", "IRON", "RETICCTPCT" in the last 72  hours. Urinalysis    Component Value Date/Time   COLORURINE COLORLESS (A) 09/08/2022 1927   APPEARANCEUR HAZY (A) 09/08/2022 1927   APPEARANCEUR Clear 04/19/2021 1101   LABSPEC <1.005 (L) 09/08/2022 1927   PHURINE 5.5 09/08/2022 1927   GLUCOSEU NEGATIVE 09/08/2022 1927   GLUCOSEU NEGATIVE 10/26/2019 1504   HGBUR NEGATIVE 09/08/2022 1927   HGBUR negative 04/28/2010 1559   BILIRUBINUR NEGATIVE 09/08/2022 1927   BILIRUBINUR negative 08/22/2021 1638   BILIRUBINUR Negative 04/19/2021 1101   KETONESUR NEGATIVE 09/08/2022 1927   PROTEINUR NEGATIVE 09/08/2022 1927   UROBILINOGEN 0.2 08/22/2021 1638   UROBILINOGEN 0.2 10/26/2019 1504   NITRITE NEGATIVE 09/08/2022 1927   LEUKOCYTESUR MODERATE (A) 09/08/2022 1927   Sepsis Labs Recent Labs  Lab 01/07/23 1947 01/09/23 0159  WBC 3.6* 3.4*   Microbiology No results found for this or any previous visit (from the past 240 hour(s)).   Time coordinating discharge: Over 30 minutes  SIGNED:   Willeen Niece, MD  [facility] 01/10/2023, 2:48 PM Pager   If 7PM-7AM, please contact night-coverage

## 2023-01-10 NOTE — Consult Note (Addendum)
Cardiology Consultation   Aimee ID: [name] MRN: [id number]; DOB: 08-01-1956  Admit date: 01/07/2023 Date of Consult: 01/10/2023  PCP:  Aimee Rainwater, MD   [facility] Providers Cardiologist:  Aimee Sprague, MD   {  Aimee Profile:   Aimee Davis is a 67 y.o. female with a hx of nonobstructive coronary artery disease, hyperlipidemia, palpitations, COPD, GERD who is being seen 01/10/2023 for the evaluation of chest pain at the request of Aimee Davis.  History of Present Illness:   Aimee Davis has history of minimal nonobstructive coronary artery disease with a coronary artery calcification score of 90 in 2022.  She has no significant cardiac history or family history of cardiac disease.  She was seen in March 2022 by Aimee Davis who evaluated her for complaints of exertional chest pain and underwent coronary CT with results listed as above.  Echocardiogram during that time indicated normal EF with suspected symptoms being noncardiac in nature.  She was seen once again in March 2024 with concerns of palpitations and was given a ZIO monitor which showed predominantly sinus rhythm with 2 runs of SVT lasting 8 beats.  She then was seen again in April 2024 to follow-up with her palpitations and was encouraged that they were not of significant concern and prescribed metoprolol.  However, Aimee has not been taking and states she did not know it was prescribed. She had denied any chest pain at that time.  Ischemic evaluation has not been recommended.  Now Aimee presented to the med center emergency room on 01/08/2023 for complaints of intermittent episodes of nonradiating chest pressure for the past 3 weeks that is nonexertional and not with any accompanying symptoms.  Generally the symptoms resolved within minutes without nitroglycerin.  Aimee is not able to articulate what her chest pain feels like exactly, but describes it as a dull "flushing" feeling.  Pain is  not reproducible, not exacerbated by any specific motion or positions, or relieved by anything in particular. Generally these episodes occur intermittently and randomly all throughout the day and not worse or better with any changes.  She denies any symptoms being related to her acid reflux or occurrences around food or when lying down.  Aimee denies any illicit drug use.  She is a former smoker.  Aimee also seems to be more concerned about her overall feeling of weakness and tiredness, but also reports having inconsistent sleep and poor appetite.  Labs and imaging thus far have been normal and unremarkable.  EKG showing normal sinus rhythm with nonspecific T wave changes but no change from prior readings.  Negative troponins negative chest x-ray, negative CTA.  Normal BNP.  Echo showing normal EF of 55 to 60% without any regional wall motion abnormalities no other significant findings.   Past Medical History:  Diagnosis Date   Abdominal pain 08/16/2008   Qualifier: Diagnosis of  By: Aimee Davis, Aimee Davis     Abdominal pain, epigastric 09/08/2019   Allergy    seasonal, PCN, antidepressants, bentyl   Aortic atherosclerosis 02/23/2021   Arthritis    Asthma    Asymptomatic bacteriuria 08/24/2021   UA showed leukocytes but Aimee does not have dysuria, fevers or hematuria. She also had bacteruria in her last UA. Her symptoms of fatigue, and loss of appetite are non-specific but if persist or she begins having dysuria we can work up further. A UTI would not explain the abdominal pain Aimee is having.    Carpal tunnel  syndrome on both sides    right worse than left 2008   Cataract 06/11/2013   COPD (chronic obstructive pulmonary disease)    COVID-19 virus infection 06/18/2016   Eczema 05/02/2018   Food poisoning 04/04/2021   GERD (gastroesophageal reflux disease)    Hair loss 07/07/2019   Hyperlipidemia    LDL 108 JULY 2013   IBS (irritable bowel syndrome)    Macular rash 05/20/2019   Multiple  nevi 03/01/2015   Pneumonia    Positive TB test    From [facility] 2009 note : Recent F/u at [facility]. CXRAy negative.  Positive TST in 2007 (14mm).  Unable to tolerate INH due to hepatotoxicity   Primary insomnia 06/05/2012   Skin macule 04/21/2019   Tinnitus 07/07/2019   URI (upper respiratory infection) 11/01/2010   Qualifier: Diagnosis of  By: Aimee Davis, Aimee Davis      Past Surgical History:  Procedure Laterality Date   BREAST CYST EXCISION Right 2011   BREAST EXCISIONAL BIOPSY     benign   BREAST EXCISIONAL BIOPSY Right 1990   benign   INDUCED ABORTION  2005   VAGINAL HYSTERECTOMY  1983   partial; for abnormal bleeding    Inpatient Medications: Scheduled Meds:  aspirin EC  81 mg Oral Daily   ezetimibe  10 mg Oral Daily   fluticasone  1 spray Each Nare Daily   fluticasone furoate-vilanterol  1 puff Inhalation Daily   And   umeclidinium bromide  1 puff Inhalation Daily   pantoprazole  40 mg Oral BID AC   Continuous Infusions:  PRN Meds: acetaminophen **OR** acetaminophen, albuterol, melatonin, nitroGLYCERIN, ondansetron (ZOFRAN) IV  Allergies:    Allergies  Allergen Reactions   Dicyclomine     confusion   Hydrocodone Other (See Comments)    Recovering addict X 29 years; no narcotics per Aimee   Lipitor [Atorvastatin] Other (See Comments)    Pt reports causes upset stomach, cramps in legs, poor appetite, and sleep disturbance   Penicillins Other (See Comments)    REACTION: rash   Levsin [Hyoscyamine] Anxiety    Social History:   Social History   Socioeconomic History   Marital status: Divorced    Spouse name: Not on file   Number of children: 2   Years of education: Not on file   Highest education level: Not on file  Occupational History   Occupation: retired  Tobacco Use   Smoking status: Former    Packs/day: 0.33    Years: 43.00    Additional pack years: 0.00    Total pack years: 14.19    Types: Cigarettes    Quit date: 01/06/2013    Years since quitting:  10.0   Smokeless tobacco: Never  Vaping Use   Vaping Use: Never used  Substance and Sexual Activity   Alcohol use: No    Alcohol/week: 0.0 standard drinks of alcohol   Drug use: Not Currently    Comment: x 26 yrs.   Sexual activity: Not on file    Comment: S/P PARTIAL HYSTERECTOMY  Other Topics Concern   Not on file  Social History Narrative   Not on file   Social Determinants of Health   Financial Resource Strain: Not on file  Food Insecurity: Not on file  Transportation Needs: No Transportation Needs (01/09/2023)   PRAPARE - Transportation    Lack of Transportation (Medical): No    Lack of Transportation (Non-Medical): No  Physical Activity: Not on file  Stress: Not on file  Social Connections: Not on file  Intimate Partner Violence: Not At Risk (01/09/2023)   Humiliation, Afraid, Rape, and Kick questionnaire    Fear of Current or Ex-Partner: No    Emotionally Abused: No    Physically Abused: No    Sexually Abused: No    Family History:    Family History  Problem Relation Age of Onset   Hypertension Mother    Heart disease Mother    Diabetes Mother    Arthritis Mother    Stroke Father    Diabetes Father    Heart disease Father    Hyperlipidemia Father    Hypertension Father    Kidney disease Father    Hypertension Sister    Breast cancer Sister    Breast cancer Sister    Hypertension Sister    Arthritis Sister    Hypertension Sister    Diabetes Sister    Mental illness Brother    Aneurysm Brother        brain   Arthritis Brother    Hypertension Maternal Aunt    Hypertension Maternal Uncle    Stomach cancer Maternal Uncle    Hypertension Paternal Aunt    Kidney disease Paternal Aunt        x 2   Hypertension Paternal Uncle    Kidney disease Paternal Uncle        x 2   Colon cancer Neg Hx    Esophageal cancer Neg Hx    Pancreatic cancer Neg Hx      ROS:  Please see the history of present illness.  All other ROS reviewed and negative.      Physical Exam/Data:   Vitals:   01/09/23 0838 01/09/23 1138 01/09/23 2005 01/10/23 0338  BP:  119/63 126/64 128/67  Pulse:  65 72 65  Resp:  17 18 18   Temp:   97.7 F (36.5 C) 98.8 F (37.1 C)  TempSrc:   Oral Oral  SpO2: 98% 98%    Weight:    64.9 kg  Height:       No intake or output data in the 24 hours ending 01/10/23 1311    01/10/2023    3:38 AM 01/09/2023    4:04 AM 01/08/2023   10:07 PM  Last 3 Weights  Weight (lbs) 143 lb 1.6 oz 143 lb 1.6 oz 143 lb 1.6 oz  Weight (kg) 64.91 kg 64.91 kg 64.91 kg     Body mass index is 25.35 kg/m.  General:  Well nourished, well developed, in no acute distress HEENT: normal Neck: no JVD Vascular: No carotid bruits; Distal pulses 2+ bilaterally Cardiac:  normal S1, S2; RRR; no murmur  Lungs:  clear to auscultation bilaterally, no wheezing, rhonchi or rales  Abd: soft, nontender, no hepatomegaly  Ext: no edema Musculoskeletal:  No deformities, BUE and BLE strength normal and equal Skin: warm and dry  Neuro:  CNs 2-12 intact, no focal abnormalities noted Psych:  Normal affect   EKG:  The EKG was personally reviewed and demonstrates:  Normal sinus rhythm with a heart rate of 86, nonspecific T wave changes throughout, unchanged based on prior readings. Telemetry:  Telemetry was personally reviewed and demonstrates: Normal sinus rhythm heart rate 65  Relevant CV Studies: Echocardiogram 01/09/2023  1. Left ventricular ejection fraction, by estimation, is 55 to 60%. Left  ventricular ejection fraction by 3D volume is 57 %. The left ventricle has  normal function. The left ventricle has no regional wall motion  abnormalities.  Left ventricular diastolic   parameters were normal.   2. Right ventricular systolic function is normal. The right ventricular  size is normal. There is normal pulmonary artery systolic pressure. The  estimated right ventricular systolic pressure is 25.1 mmHg.   3. The mitral valve is normal in structure. Mild  mitral valve  regurgitation. No evidence of mitral stenosis.   4. The aortic valve is tricuspid. Aortic valve regurgitation is not  visualized. Aortic valve sclerosis is present, with no evidence of aortic  valve stenosis.   5. The inferior vena cava is normal in size with greater than 50%  respiratory variability, suggesting right atrial pressure of 3 mmHg.   6. Possible trivial circumferential pericardial effusion is present.   Coronary CTA 12/07/2020 1. Coronary calcium score of 90. This was 65 percentile for age and sex matched control.   2. Normal coronary origin with right dominance.   3. CAD-RADS 1. Minimal non-obstructive CAD (0-24%). Consider non-atherosclerotic causes of chest pain. Consider preventive therapy and risk factor modification.  Laboratory Data:  High Sensitivity Troponin:   Recent Labs  Lab 01/07/23 1947 01/07/23 2229 01/09/23 0159  TROPONINIHS 5 4 3      Chemistry Recent Labs  Lab 01/07/23 1947 01/07/23 2229 01/09/23 0159  NA 133*  --  135  K 4.1  --  4.4  CL 100  --  101  CO2 25  --  24  GLUCOSE 110*  --  99  BUN 23  --  18  CREATININE 1.09*  --  0.90  CALCIUM 9.6  --  9.5  MG  --  2.3 2.3  GFRNONAA 55*  --  >60  ANIONGAP 8  --  10    Recent Labs  Lab 01/07/23 2229 01/09/23 0159  PROT 6.5 5.8*  ALBUMIN 3.7 3.3*  AST 23 20  ALT 19 18  ALKPHOS 83 71  BILITOT 0.3 0.6   Lipids No results for input(s): "CHOL", "TRIG", "HDL", "LABVLDL", "LDLCALC", "CHOLHDL" in the last 168 hours.  Hematology Recent Labs  Lab 01/07/23 1947 01/09/23 0159  WBC 3.6* 3.4*  RBC 4.91 4.72  HGB 13.2 13.0  HCT 40.7 38.7  MCV 82.9 82.0  MCH 26.9 27.5  MCHC 32.4 33.6  RDW 12.5 12.6  PLT 301 273   Thyroid  Recent Labs  Lab 01/07/23 2229  TSH 4.329    BNP Recent Labs  Lab 01/07/23 2229  BNP 14.5    DDimer No results for input(s): "DDIMER" in the last 168 hours.   Radiology/Studies:  ECHOCARDIOGRAM COMPLETE  Result Date: 01/09/2023     ECHOCARDIOGRAM REPORT   Aimee Name:   ALLYSA Davis Date of Exam: 01/09/2023 Medical Rec #:  [id number]       Height:       63.0 in Accession #:    [id number]      Weight:       143.1 lb Date of Birth:  06/10/1956      BSA:          1.677 m Aimee Age:    66 years        BP:           119/63 mmHg Aimee Gender: F               HR:           64 bpm. Exam Location:  Inpatient Procedure: 2D Echo, Cardiac Doppler, Color Doppler and 3D Echo Indications:    Chest pain  History:        Aimee has prior history of Echocardiogram examinations, most                 recent 12/20/2020. COPD, Signs/Symptoms:Chest Pain; Risk                 Factors:Dyslipidemia.  Sonographer:    Milda Smart Referring Phys: [id number] Angie Fava  Sonographer Comments: Image acquisition challenging due to respiratory motion. IMPRESSIONS  1. Left ventricular ejection fraction, by estimation, is 55 to 60%. Left ventricular ejection fraction by 3D volume is 57 %. The left ventricle has normal function. The left ventricle has no regional wall motion abnormalities. Left ventricular diastolic  parameters were normal.  2. Right ventricular systolic function is normal. The right ventricular size is normal. There is normal pulmonary artery systolic pressure. The estimated right ventricular systolic pressure is 25.1 mmHg.  3. The mitral valve is normal in structure. Mild mitral valve regurgitation. No evidence of mitral stenosis.  4. The aortic valve is tricuspid. Aortic valve regurgitation is not visualized. Aortic valve sclerosis is present, with no evidence of aortic valve stenosis.  5. The inferior vena cava is normal in size with greater than 50% respiratory variability, suggesting right atrial pressure of 3 mmHg.  6. Possible trivial circumferential pericardial effusion is present. FINDINGS  Left Ventricle: Left ventricular ejection fraction, by estimation, is 55 to 60%. Left ventricular ejection fraction by 3D volume is 57 %. The left  ventricle has normal function. The left ventricle has no regional wall motion abnormalities. The left ventricular internal cavity size was normal in size. There is no left ventricular hypertrophy. Left ventricular diastolic parameters were normal. Normal left ventricular filling pressure. Right Ventricle: The right ventricular size is normal. No increase in right ventricular wall thickness. Right ventricular systolic function is normal. There is normal pulmonary artery systolic pressure. The tricuspid regurgitant velocity is 2.35 m/s, and  with an assumed right atrial pressure of 3 mmHg, the estimated right ventricular systolic pressure is 25.1 mmHg. Left Atrium: Left atrial size was normal in size. Right Atrium: Right atrial size was normal in size. Pericardium: Trivial pericardial effusion is present. The pericardial effusion is circumferential. Mitral Valve: The mitral valve is normal in structure. Mild mitral valve regurgitation. No evidence of mitral valve stenosis. MV peak gradient, 5.3 mmHg. The mean mitral valve gradient is 3.0 mmHg. Tricuspid Valve: The tricuspid valve is normal in structure. Tricuspid valve regurgitation is mild . No evidence of tricuspid stenosis. Aortic Valve: The aortic valve is tricuspid. Aortic valve regurgitation is not visualized. Aortic valve sclerosis is present, with no evidence of aortic valve stenosis. Pulmonic Valve: The pulmonic valve was normal in structure. Pulmonic valve regurgitation is mild. No evidence of pulmonic stenosis. Aorta: The aortic root is normal in size and structure. Venous: The inferior vena cava is normal in size with greater than 50% respiratory variability, suggesting right atrial pressure of 3 mmHg. IAS/Shunts: No atrial level shunt detected by color flow Doppler.  LEFT VENTRICLE PLAX 2D LVIDd:         3.70 cm         Diastology LVIDs:         2.40 cm         LV e' medial:    7.72 cm/s LV PW:         0.80 cm         LV E/e' medial:  13.1 LV IVS:  0.90 cm         LV e' lateral:   9.36 cm/s LVOT diam:     2.20 cm         LV E/e' lateral: 10.8 LV SV:         104 LV SV Index:   62 LVOT Area:     3.80 cm        3D Volume EF                                LV 3D EF:    Left                                             ventricul                                             ar                                             ejection                                             fraction                                             by 3D                                             volume is                                             57 %.                                 3D Volume EF:                                3D EF:        57 %                                LV EDV:       98 ml                                LV ESV:       42 ml  LV SV:        56 ml RIGHT VENTRICLE             IVC RV S prime:     12.40 cm/s  IVC diam: 1.80 cm TAPSE (M-mode): 1.9 cm LEFT ATRIUM             Index        RIGHT ATRIUM           Index LA diam:        2.90 cm 1.73 cm/m   RA Area:     10.80 cm LA Vol (A2C):   36.1 ml 21.52 ml/m  RA Volume:   23.00 ml  13.71 ml/m LA Vol (A4C):   25.6 ml 15.26 ml/m LA Biplane Vol: 30.8 ml 18.36 ml/m  AORTIC VALVE             PULMONIC VALVE LVOT Vmax:   145.00 cm/s PR End Diast Vel: 3.11 msec LVOT Vmean:  86.500 cm/s LVOT VTI:    0.273 m  AORTA Ao Root diam: 3.40 cm Ao Asc diam:  3.20 cm MITRAL VALVE                  TRICUSPID VALVE MV Area (PHT): 3.24 cm       TR Peak grad:   22.1 mmHg MV Area VTI:   2.72 cm       TR Mean grad:   17.0 mmHg MV Peak grad:  5.3 mmHg       TR Vmax:        235.00 cm/s MV Mean grad:  3.0 mmHg       TR Vmean:       198.0 cm/s MV Vmax:       1.15 m/s MV Vmean:      76.6 cm/s      SHUNTS MV Decel Time: 234 msec       Systemic VTI:  0.27 m MR Peak grad:    105.7 mmHg   Systemic Diam: 2.20 cm MR Mean grad:    77.0 mmHg MR Vmax:         514.00 cm/s MR Vmean:        425.5 cm/s MR PISA:         1.01 cm MR  PISA Eff ROA: 8 mm MR PISA Radius:  0.40 cm MV E velocity: 101.00 cm/s MV A velocity: 78.60 cm/s MV E/A ratio:  1.28 Armanda Magic MD Electronically signed by Armanda Magic MD Signature Date/Time: 01/09/2023/12:22:20 PM    Final    CT Angio Chest Pulmonary Embolism (PE) W or WO Contrast  Result Date: 01/08/2023 CLINICAL DATA:  Pulmonary embolism (PE) suspected, high prob. Palpitations EXAM: CT ANGIOGRAPHY CHEST WITH CONTRAST TECHNIQUE: Multidetector CT imaging of the chest was performed using the standard protocol during bolus administration of intravenous contrast. Multiplanar CT image reconstructions and MIPs were obtained to evaluate the vascular anatomy. RADIATION DOSE REDUCTION: This exam was performed according to the departmental dose-optimization program which includes automated exposure control, adjustment of the mA and/or kV according to Aimee size and/or use of iterative reconstruction technique. CONTRAST:  75mL OMNIPAQUE IOHEXOL 350 MG/ML SOLN COMPARISON:  10/31/2021 FINDINGS: Cardiovascular: Heart is normal size. Aorta is normal caliber. No filling defects in the pulmonary arteries to suggest pulmonary emboli. Few scattered aortic calcifications. Mediastinum/Nodes: No mediastinal, hilar, or axillary adenopathy. Trachea and esophagus are unremarkable. Thyroid unremarkable. Lungs/Pleura: Mild centrilobular emphysema. No confluent opacities or effusions. Upper Abdomen: No acute  findings Musculoskeletal: Chest wall soft tissues are unremarkable. No acute bony abnormality. Review of the MIP images confirms the above findings. IMPRESSION: No evidence of pulmonary embolus. No acute cardiopulmonary disease. Aortic Atherosclerosis (ICD10-I70.0). Electronically Signed   By: Charlett Nose M.D.   On: 01/08/2023 00:54   DG Chest 2 View  Result Date: 01/07/2023 CLINICAL DATA:  Palpitations. Fatigue. EXAM: CHEST - 2 VIEW COMPARISON:  Radiograph 10/10/2021, CT 10/31/2021 FINDINGS: The emphysematous changes on  prior CT are not well demonstrated by radiograph.The cardiomediastinal contours are normal. The lungs are clear. Pulmonary vasculature is normal. No consolidation, pleural effusion, or pneumothorax. No acute osseous abnormalities are seen. IMPRESSION: No acute chest findings. Electronically Signed   By: Narda Rutherford M.D.   On: 01/07/2023 20:00     Assessment and Plan:   Atypical chest pain with history of palpitations Aimee presenting with atypical chest pain that is nonexertional, nonradiating, not relieved or worsened by any interventions, without any accompanied symptom.  CTA in 2022 indicating nonobstructive CAD with a CAC score of 90.  EKG without any new ischemic changes.  Troponins negative.  Had outpatient ZIO monitor that indicated 2 episodes of SVT lasting for 8 beats.  All workup thus far has not indicated any underlying cardiac causes.  No other workup indicated at this time.  Hyperlipidemia LDL 81, HDL 108 and on Zetia 10 mg daily with good control.  Had a very long conversation about this as Aimee was previously told that she had extremely elevated levels which made her extremely anxious and stressed.  Has previously been intolerant to statins and has tried multiple.  GERD Potentially this could be exacerbating underlying chest pain however given presentation do not suspect this to be the cause of her chest pain.  Palpitations Recently prescribed as needed metoprolol for episodes of palpitations.  She has not tried taking, would recommend taking if having persistent palpitations  Weakness and fatigue We spoke at great lengths about enhancing lifestyle modifications and improving sleep, exercise, diet and nutrition.  Aimee has reported poor sleeping habits and lack of appetite.  I encouraged her to see and follow-up with PCP about further guidance. Normal thyroid function     Risk Assessment/Risk Scores:     For questions or updates, please contact [facility] Please consult [email or web address] for contact info under    Signed, Abagail Kitchens, PA-C  01/10/2023 1:11 PM   Aimee seen and examined.  Agree with above documentation.  Aimee Davis is a 67 year old female with history of nonobstructive CAD, COPD, hyperlipidemia, GERD who were consulted for evaluation of chest pain at the request of Aimee. Idelle Davis.  She follows with Aimee Davis.  Coronary CTA 11/2020 showed less than 25% stenosis in proximal RCA, calcium score 90 (83rd percentile).  Echo 12/2020 showed normal biventricular function, no significant valvular disease.  It was thought that her chest pain and shortness of breath were noncardiac, thought to likely be related to her asthma.  She has been unable to tolerate statin, has been on Zetia 10 mg daily.  Has declined PCSK9 inhibitor.  She saw Aimee. Lynnette Caffey for palpitations on 11/23/2022, cardiac monitor was ordered which showed no significant arrhythmias.  She presented to the ED with chest pain on 01/08/2023.  Reports chest pain lasted over 24 hours, described as feeling like flushing feeling on left side of chest.  EKG shows sinus rhythm, rate 86, T wave inversions in leads aVL, V1/2.  Labs notable for creatinine 0.9, troponin 5 >  4 > 3, hemoglobin 13, WBC 3.4.  CTPA was unremarkable.  Echocardiogram showed normal biventricular function, no significant valvular disease.  On exam, Aimee is alert and oriented, regular rate and rhythm, no murmurs, lungs CTAB, no LE edema or JVD.  Given chest pain for over 24 hours with negative troponins, suspect noncardiac chest pain.  Given previous workup with nonobstructive CAD on coronary CTA and echocardiogram unremarkable this admission, no further cardiac workup recommended at this time.  She does report palpitations and was recently prescribed as needed metoprolol but has not started taking, recommend taking as needed if persistent palpitations.  Okay for discharge from cardiac standpoint.  Little Ishikawa,  MD

## 2023-01-11 ENCOUNTER — Other Ambulatory Visit (HOSPITAL_COMMUNITY): Payer: Self-pay

## 2023-01-11 ENCOUNTER — Other Ambulatory Visit: Payer: Self-pay

## 2023-01-11 ENCOUNTER — Telehealth: Payer: Self-pay

## 2023-01-11 MED ORDER — EZETIMIBE 10 MG PO TABS: 10.0000 mg | ORAL_TABLET | Freq: Every day | ORAL | 3 refills | Status: DC

## 2023-01-11 NOTE — Transitions of Care (Post Inpatient/ED Visit) (Unsigned)
   01/11/2023  Name: Aimee Davis MRN: [id number] DOB: [birth year]  Today's TOC FU Call Status: Today's TOC FU Call Status:: Unsuccessul Call (1st Attempt) Unsuccessful Call (1st Attempt) Date: 01/11/23  Attempted to reach the patient regarding the most recent Inpatient/ED visit.  Follow Up Plan: Additional outreach attempts will be made to reach the patient to complete the Transitions of Care (Post Inpatient/ED visit) call.   Signature Karena Addison, LPN [facility], [place] Nurse Health Advisor Direct Dial [phone]

## 2023-01-15 NOTE — Transitions of Care (Post Inpatient/ED Visit) (Unsigned)
   01/15/2023  Name: Aimee Davis MRN: [id number] DOB: [birth year]  Today's TOC FU Call Status: Today's TOC FU Call Status:: Unsuccessful Call (2nd Attempt) Unsuccessful Call (1st Attempt) Date: 01/11/23 Unsuccessful Call (2nd Attempt) Date: 01/15/23  Attempted to reach the patient regarding the most recent Inpatient/ED visit.  Follow Up Plan: Additional outreach attempts will be made to reach the patient to complete the Transitions of Care (Post Inpatient/ED visit) call.   Signature Karena Addison, LPN [facility] Nurse Health Advisor Direct Dial [phone]

## 2023-01-16 ENCOUNTER — Encounter: Payer: 59 | Admitting: Student

## 2023-01-16 NOTE — Transitions of Care (Post Inpatient/ED Visit) (Signed)
01/16/2023  Name: Aimee Davis MRN: [id number] DOB: 06-04-1956  Today's TOC FU Call Status: Today's TOC FU Call Status:: Unsuccessful Call (2nd Attempt) Unsuccessful Call (1st Attempt) Date: 01/11/23 Unsuccessful Call (2nd Attempt) Date: 01/15/23 [facility] FU Call Complete Date: 01/16/23  Transition Care Management Follow-up Telephone Call Date of Discharge: 01/10/23 Discharge Facility: [facility]) Type of Discharge: Inpatient Admission Primary Inpatient Discharge Diagnosis:: palpitations How have you been since you were released from the hospital?: Better Any questions or concerns?: No  Items Reviewed: Did you receive and understand the discharge instructions provided?: Yes Medications obtained,verified, and reconciled?: Yes (Medications Reviewed) Any new allergies since your discharge?: No Dietary orders reviewed?: Yes Do you have support at home?: No  Medications Reviewed Today: Medications Reviewed Today     Reviewed by Karena Addison, LPN (Licensed Practical Nurse) on 01/16/23 at 1624  Med List Status: <None>   Medication Order Taking? Sig Documenting Provider Last Dose Status Informant  albuterol (VENTOLIN HFA) 108 (90 Base) MCG/ACT inhaler [id number] Yes USE 2 INHALATIONS BY MOUTH EVERY 6 HOURS AS NEEDED FOR WHEEZING  OR SHORTNESS OF BREATH  Patient taking differently: Inhale 2 puffs into the lungs every 6 (six) hours as needed for wheezing.   Steffanie Rainwater, MD Taking Active Self, Pharmacy Records  Artificial Tear Ointment (DRY EYES OP) [id number] Yes Apply 1 drop to eye daily as needed (dry eyes). [provider] Taking Active Self, Pharmacy Records  Ascorbic Acid (VITAMIN C) 100 MG tablet [id number] Yes Take 50 mg by mouth daily. [provider] Taking Active Self, Pharmacy Records  aspirin EC 81 MG tablet [id number] Yes Take 1 tablet (81 mg total) by mouth daily. Swallow whole. Orbie Pyo, MD Taking Active Self, Pharmacy Records   cholecalciferol (VITAMIN D3) 25 MCG (1000 UNIT) tablet [id number] Yes Take 1 tablet (1,000 Units total) by mouth daily. Steffanie Rainwater, MD Taking Active Self, Pharmacy Records  ezetimibe (ZETIA) 10 MG tablet [id number] Yes Take 1 tablet (10 mg total) by mouth daily. [name], MD Taking Active   fexofenadine [facility]) 180 MG tablet [id number] Yes Take 1 tablet (180 mg total) by mouth daily. Steffanie Rainwater, MD Taking Active Self, Pharmacy Records  Fluticasone-Umeclidin-Vilant [facility] ELLIPTA) 200-62.5-25 MCG/ACT AEPB [id number] Yes USE 1 INHALATION BY MOUTH ONCE  DAILY  Patient taking differently: Inhale 1 puff into the lungs daily as needed (as needed).   Evlyn Kanner, MD Taking Active Self, Pharmacy Records  mometasone (NASONEX) 50 MCG/ACT nasal spray [id number] Yes PLACE 2 SPRAYS IN EACH NOSTRIL ONCE DAILY AS NEEDED.  Patient taking differently: Place 2 sprays into the nose daily.   Dolan Amen, MD Taking Active Self, Pharmacy Records  Multiple Vitamins-Minerals (MULTIVITAMIN WITH MINERALS) tablet [id number] Yes Take 1 tablet by mouth daily. [provider] Taking Active Self, Pharmacy Records  pantoprazole (PROTONIX) 40 MG tablet [id number] Yes Take 1 tablet (40 mg total) by mouth 2 (two) times daily before a meal for 15 days. Willeen Niece, MD Taking Active             Home Care and Equipment/Supplies: Were Home Health Services Ordered?: NA Any new equipment or medical supplies ordered?: NA  Functional Questionnaire: Do you need assistance with bathing/showering or dressing?: No Do you need assistance with meal preparation?: No Do you need assistance with eating?: No Do you have difficulty maintaining continence: No Do you need assistance with getting out of bed/getting out of a chair/moving?: No Do you have difficulty managing  or taking your medications?: No  Follow up appointments reviewed: PCP Follow-up appointment confirmed?: Yes Date of  PCP follow-up appointment?: 01/29/23 Follow-up Provider: [facility] Follow-up appointment confirmed?: NA Do you need transportation to your follow-up appointment?: No Do you understand care options if your condition(s) worsen?: Yes-patient verbalized understanding    SIGNATURE Karena Addison, LPN [facility] Nurse Health Advisor Direct Dial [phone]

## 2023-01-22 ENCOUNTER — Other Ambulatory Visit: Payer: Self-pay | Admitting: Student

## 2023-01-22 DIAGNOSIS — J439 Emphysema, unspecified: Secondary | ICD-10-CM

## 2023-01-29 ENCOUNTER — Encounter: Payer: 59 | Admitting: Student

## 2023-01-29 NOTE — Progress Notes (Deleted)
CC: Hospital follow-up  HPI:  [name] is a 67 y.o. female with a history of CAD, hyperlipidemia, COPD, GERD who presents for hospital follow-up.  Patient was recently hospitalized from 01/08/2023-01/10/2023 for chest pain.  Patient initially presented for atypical chest pain.  Patient had cardiac workup which was negative.  Thought to be from asthma related chest pain.  Most recent labs: 01/09/2023 sodium 135, potassium 4.4, creatinine 0.90 CBC showing white count 3.4, hemoglobin 13, MCV 82,  Troponin 3 12/24/22: Lipid profile TC 205, TG 91, HDL 108, LDL 81  12/07/2020 CT coronary showing calcium score of 90.  Patient has minimal nonobstructive CAD. 01/05/2023 echo showing EF of 55 to 60%.  Otherwise normal structure.  Patient in the past was evaluated by Dr. Lynnette Davis on 11/23/2022 for concerns of palpitations, lab work was unrevealing for causes   Hyperlipidemia: Zetia 10 mg daily CAD: Aspirin 81 mg daily COPD: Trelegy Ellipta GERD: Protonix 20 mg daily Palpitation: To be on Metop 25  Patient was leukopenic  Past Medical History:  Diagnosis Date   Abdominal pain 08/16/2008   Qualifier: Diagnosis of  By: Daphine Deutscher FNP, [name]     Abdominal pain, epigastric 09/08/2019   Allergy    seasonal, PCN, antidepressants, bentyl   Aortic atherosclerosis (HCC) 02/23/2021   Arthritis    Asthma    Asymptomatic bacteriuria 08/24/2021   UA showed leukocytes but patient does not have dysuria, fevers or hematuria. She also had bacteruria in her last UA. Her symptoms of fatigue, and loss of appetite are non-specific but if persist or she begins having dysuria we can work up further. A UTI would not explain the abdominal pain patient is having.    Carpal tunnel syndrome on both sides    right worse than left 2008   Cataract 06/11/2013   COPD (chronic obstructive pulmonary disease) (HCC)    COVID-19 virus infection 06/18/2016   Eczema 05/02/2018   Food poisoning 04/04/2021   GERD  (gastroesophageal reflux disease)    Hair loss 07/07/2019   Hyperlipidemia    LDL 108 JULY 2013   IBS (irritable bowel syndrome)    Macular rash 05/20/2019   Multiple nevi 03/01/2015   Pneumonia    Positive TB test    From [facility] 2009 note : Recent F/u at [facility]. CXRAy negative.  Positive TST in 2007 (14mm).  Unable to tolerate INH due to hepatotoxicity   Primary insomnia 06/05/2012   Skin macule 04/21/2019   Tinnitus 07/07/2019   URI (upper respiratory infection) 11/01/2010   Qualifier: Diagnosis of  By: Daphine Deutscher FNP, Zena Amos       Current Outpatient Medications:    albuterol (VENTOLIN HFA) 108 (90 Base) MCG/ACT inhaler, USE 2 INHALATIONS BY MOUTH EVERY 6 HOURS AS NEEDED FOR WHEEZING  OR SHORTNESS OF BREATH (Patient taking differently: Inhale 2 puffs into the lungs every 6 (six) hours as needed for wheezing.), Disp: 26.8 g, Rfl: 2   Artificial Tear Ointment (DRY EYES OP), Apply 1 drop to eye daily as needed (dry eyes)., Disp: , Rfl:    Ascorbic Acid (VITAMIN C) 100 MG tablet, Take 50 mg by mouth daily., Disp: , Rfl:    aspirin EC 81 MG tablet, Take 1 tablet (81 mg total) by mouth daily. Swallow whole., Disp: 90 tablet, Rfl: 3   cholecalciferol (VITAMIN D3) 25 MCG (1000 UNIT) tablet, Take 1 tablet (1,000 Units total) by mouth daily., Disp: 60 tablet, Rfl: 1   ezetimibe (ZETIA) 10 MG tablet, Take 1  tablet (10 mg total) by mouth daily., Disp: 90 tablet, Rfl: 3   fexofenadine (ALLEGRA) 180 MG tablet, Take 1 tablet (180 mg total) by mouth daily., Disp: 90 tablet, Rfl: 1   Fluticasone-Umeclidin-Vilant (TRELEGY ELLIPTA) 200-62.5-25 MCG/ACT AEPB, USE 1 INHALATION BY MOUTH ONCE  DAILY AT THE SAME TIME EACH DAY, Disp: 180 each, Rfl: 3   mometasone (NASONEX) 50 MCG/ACT nasal spray, PLACE 2 SPRAYS IN EACH NOSTRIL ONCE DAILY AS NEEDED. (Patient taking differently: Place 2 sprays into the nose daily.), Disp: 17 g, Rfl: 6   Multiple Vitamins-Minerals (MULTIVITAMIN WITH MINERALS) tablet, Take 1 tablet by mouth  daily., Disp: , Rfl:    pantoprazole (PROTONIX) 40 MG tablet, Take 1 tablet (40 mg total) by mouth 2 (two) times daily before a meal for 15 days., Disp: 30 tablet, Rfl: 0  Review of Systems:  ***  Constitutional: Eye: Respiratory: Cardiovascular: GI: MSK: GU: Skin: Neuro: Endocrine:   Physical Exam:  There were no vitals filed for this visit. *** General: Patient is sitting comfortably in the room  Eyes: Pupils equal and reactive to light, EOM intact  Head: Normocephalic, atraumatic  Neck: Supple, nontender, full range of motion, No JVD Cardio: Regular rate and rhythm, no murmurs, rubs or gallops. 2+ pulses to bilateral upper and lower extremities  Chest: No chest tenderness Pulmonary: Clear to ausculation bilaterally with no rales, rhonchi, and crackles  Abdomen: Soft, nontender with normoactive bowel sounds with no rebound or guarding  Neuro: Alert and orientated x3. CN II-XII intact. Sensation intact to upper and lower extremities. 2+ patellar reflex.  Back: No midline tenderness, no step off or deformities noted. No paraspinal muscle tenderness.  Skin: No rashes noted  MSK: 5/5 strength to upper and lower extremities.    Assessment & Plan:   No problem-specific Assessment & Plan notes found for this encounter.    Patient {GC/GE:3044014::"discussed with","seen with"} Dr. {NAMES:[id number]::"Guilloud","Hoffman","Mullen","Narendra","Williams","Vincent"}  Modena Slater, DO PGY-1 Internal Medicine Resident  Pager: [phone]

## 2023-02-10 ENCOUNTER — Encounter: Payer: Self-pay | Admitting: *Deleted

## 2023-02-13 ENCOUNTER — Other Ambulatory Visit: Payer: Self-pay | Admitting: Student

## 2023-02-18 ENCOUNTER — Telehealth: Payer: Self-pay

## 2023-02-18 NOTE — Telephone Encounter (Signed)
Pt states she is not feeling well. States she has running nose, fever and chill. Offer an appt, pt refused. Please call pt back.

## 2023-02-18 NOTE — Telephone Encounter (Signed)
Return pt's call -c/o runny nose, chill, cough (white phlegm),sneezing, feeling weak since yesterday. States she feels slightly better today,just feeling weak and the cough. She has not taken anything OTC - states she's unsure what to take. Her hx of IBS. States she prefers not to come in for an appt. Please advise.

## 2023-02-19 NOTE — Telephone Encounter (Signed)
Please schedule her for a televisit tomorrow so we can evaluate her symptoms.

## 2023-02-20 NOTE — Telephone Encounter (Signed)
Okay, thank you for getting her an appt. I am glad she is feeling better.

## 2023-02-20 NOTE — Telephone Encounter (Signed)
Called pt - informed to schedule a telehealth  appt. Pt stated she feels better. Stated she was unaware of a telehealth appt - explained to pt. She agreed to schedule an appt - first available telehealth appt Thursday 6/6 @ 1545 PM with Dr Nooruddin. She mentioned an irregular heartbeat; she did not mention this before until now.

## 2023-02-21 ENCOUNTER — Ambulatory Visit (INDEPENDENT_AMBULATORY_CARE_PROVIDER_SITE_OTHER): Payer: 59 | Admitting: Internal Medicine

## 2023-02-21 DIAGNOSIS — J069 Acute upper respiratory infection, unspecified: Secondary | ICD-10-CM | POA: Diagnosis not present

## 2023-02-21 NOTE — Progress Notes (Signed)
I connected with  Aimee Davis on 02/22/23 by telephone and verified that I am speaking with the correct person using two identifiers.   I discussed the limitations of evaluation and management by telemedicine. The patient expressed understanding and agreed to proceed.  CC: productive cough  This is a telephone encounter between Aimee Davis and Aimee Davis on 02/22/2023 for productive cough. The visit was conducted with the patient located at  gym  and [name] at [facility]. The patient's identity was confirmed using their DOB and current address. The patient has consented to being evaluated through a telephone encounter and understands the associated risks (an examination cannot be done and the patient may need to come in for an appointment) / benefits (allows the patient to remain at home, decreasing exposure to coronavirus). I personally spent 8 minutes on medical discussion.   HPI:  [name] is a 67 y.o. with PMH as below.   Please see A&P for assessment of the patient's acute and chronic medical conditions.   Past Medical History:  Diagnosis Date   Abdominal pain 08/16/2008   Qualifier: Diagnosis of  By: Daphine Deutscher FNP, [name]     Abdominal pain, epigastric 09/08/2019   Allergy    seasonal, PCN, antidepressants, bentyl   Aortic atherosclerosis (HCC) 02/23/2021   Arthritis    Asthma    Asymptomatic bacteriuria 08/24/2021   UA showed leukocytes but patient does not have dysuria, fevers or hematuria. She also had bacteruria in her last UA. Her symptoms of fatigue, and loss of appetite are non-specific but if persist or she begins having dysuria we can work up further. A UTI would not explain the abdominal pain patient is having.    Carpal tunnel syndrome on both sides    right worse than left 2008   Cataract 06/11/2013   COPD (chronic obstructive pulmonary disease) (HCC)    COVID-19 virus infection 06/18/2016   Eczema 05/02/2018   Food poisoning 04/04/2021   GERD  (gastroesophageal reflux disease)    Hair loss 07/07/2019   Hyperlipidemia    LDL 108 JULY 2013   IBS (irritable bowel syndrome)    Macular rash 05/20/2019   Multiple nevi 03/01/2015   Pneumonia    Positive TB test    From [facility] 2009 note : Recent F/u at [facility]. CXRAy negative.  Positive TST in 2007 (14mm).  Unable to tolerate INH due to hepatotoxicity   Primary insomnia 06/05/2012   Skin macule 04/21/2019   Tinnitus 07/07/2019   URI (upper respiratory infection) 11/01/2010   Qualifier: Diagnosis of  By: Daphine Deutscher FNP, Zena Amos     Review of Systems:  denies fever, chills, shortness of breath, nausea, decreased appetite  Assessment & Plan:   Upper respiratory infection, viral Patient presents via tele health for several days of productive cough. She reports that on Sunday after church she developed cough and felt tired. The next day she started coughing up thick mucus. She hasn't felt shortness of breath. For her history of COPD she uses Trelegy inhaler and has been adherent with this. She also has albuterol inhaler but has not needed this in the last few days. She denies feeling shortness of breath and was at the gym working out when I called. She has mucinex at home but has not used this. A: symptoms are improving with supportive care. She does have some features concerning for COPD exacerbation, however with her subjective improvement over the last few days and tolerating being on  the treadmill at the gym I do not think she is having a COPD exacerbation. Likely viral URI that is improving. P: Continue with supportive care, can try mucinex to see if this helps.  I called and left 2 voicemails on patients phone after confirming plan with attending. I will call again Friday morning to confirm with her.  Called patient 6/7 x2 and left VM to return call to clinic.    Patient discussed with Dr. Hurshel Keys Kenslee Achorn, D.O. [facility] Internal Medicine  PGY-2 Pager: [phone]  Phone:  [phone] Date 02/22/2023  Time 8:51 AM

## 2023-02-21 NOTE — Assessment & Plan Note (Addendum)
Patient presents via tele health for several days of productive cough. She reports that on Sunday after church she developed cough and felt tired. The next day she started coughing up thick mucus. She hasn't felt shortness of breath. For her history of COPD she uses Trelegy inhaler and has been adherent with this. She also has albuterol inhaler but has not needed this in the last few days. She denies feeling shortness of breath and was at the gym working out when I called. She has mucinex at home but has not used this. A: symptoms are improving with supportive care. She does have some features concerning for COPD exacerbation, however with her subjective improvement over the last few days and tolerating being on the treadmill at the gym I do not think she is having a COPD exacerbation. Likely viral URI that is improving. P: Continue with supportive care, can try mucinex to see if this helps.  I called and left 2 voicemails on patients phone after confirming plan with attending. I will call again Friday morning to confirm with her.  Called patient 6/7 x2 and left VM to return call to clinic.

## 2023-03-05 ENCOUNTER — Other Ambulatory Visit: Payer: Self-pay

## 2023-03-05 ENCOUNTER — Ambulatory Visit (INDEPENDENT_AMBULATORY_CARE_PROVIDER_SITE_OTHER): Payer: 59 | Admitting: Student

## 2023-03-05 ENCOUNTER — Encounter: Payer: Self-pay | Admitting: Student

## 2023-03-05 ENCOUNTER — Ambulatory Visit (INDEPENDENT_AMBULATORY_CARE_PROVIDER_SITE_OTHER): Payer: 59

## 2023-03-05 VITALS — BP 115/61 | HR 68 | Temp 98.1°F | Ht 63.0 in | Wt 148.4 lb

## 2023-03-05 DIAGNOSIS — J069 Acute upper respiratory infection, unspecified: Secondary | ICD-10-CM

## 2023-03-05 DIAGNOSIS — K582 Mixed irritable bowel syndrome: Secondary | ICD-10-CM

## 2023-03-05 DIAGNOSIS — Z0000 Encounter for general adult medical examination without abnormal findings: Secondary | ICD-10-CM

## 2023-03-05 DIAGNOSIS — R002 Palpitations: Secondary | ICD-10-CM

## 2023-03-05 NOTE — Assessment & Plan Note (Addendum)
Patient here to follow-up after recent hospitalization from 4/22 to 4/25 for palpitations. Patient reports her palpitations started 3 to 4 weeks prior to presenting to [facility]. She was evaluated by cardiology in March with lab work and a Cytogeneticist. Patient wore the Zio patch for 3 days and it showed mostly sinus rhythm with PACs as well as 2 short runs of SVTs.  Workup during her hospitalizations including normal TSH, troponins, electrolytes, and kidney function. EKG showed sinus rhythm with nonspecific ST changes. Neggative CXR.  CTA chest was negative for PE. TTE showed EF 55-60% and no valvular pathology. Cardiology was consulted and they did not recommend any further work up. Patient has had intemittent "flutter feeling in my chest" since hospital discharge. They last a few seconds and she often has mild SOB with them. She denies any chest pain, lightheadedness, headaches or SOB outside the episodes. Her HR is 68 today and I do not appreciate any irregular rhythm on heart auscultation. She has follow up with cardiology in 2 weeks. She will likely benefit from a PRN beta blocker.   Plan: -Follow up with cardiology as scheduled on 7/5

## 2023-03-05 NOTE — Assessment & Plan Note (Signed)
She is still interested in seeing a dietician to help her figure out what to eat but this service has been denied by her insurance. She has follow up with GI on 6/27 that was recently cancelled. Patient states she did not cancel the appointment so I advised her to call the office to keep her appointment.  -Follow up with GI -Continue FODMAP diet

## 2023-03-05 NOTE — Assessment & Plan Note (Signed)
This has improved. Likely secondary to allergies. She has been taking her allergra daily and using her inhalers as prescribed. She reports using her albuterol more especially since she has been exercising more. She has not rhonchi or wheezing on exam. I have advised her to continue her allergy medication and inhalers.

## 2023-03-05 NOTE — Progress Notes (Signed)
Subjective:   Aimee Davis is a 67 y.o. female who presents for an Initial Medicare Annual Wellness Visit.  Visit Complete: In person  Patient Medicare AWV questionnaire was completed by the patient on 03/05/2023; I have confirmed that all information answered by patient is correct and no changes since this date.  Review of Systems    Defer to PCP       Objective:    Today's Vitals   03/05/23 1322 03/05/23 1323  BP: 115/61   Pulse: 68   Temp: 98.1 F (36.7 C)   TempSrc: Oral   SpO2: 97%   Weight: 148 lb 6.4 oz (67.3 kg)   Height: 5\' 3"  (1.6 m)   PainSc:  0-No pain   Body mass index is 26.29 kg/m.     03/05/2023    1:23 PM 03/05/2023   11:15 AM 01/09/2023    3:18 AM 01/07/2023    7:39 PM 07/16/2022    1:18 PM 03/21/2022   10:13 AM 12/18/2021    3:55 PM  Advanced Directives  Does Patient Have a Medical Advance Directive? No No No No No No No  Would patient like information on creating a medical advance directive? No - Patient declined No - Patient declined No - Patient declined  No - Patient declined No - Patient declined No - Patient declined    Current Medications (verified) Outpatient Encounter Medications as of 03/05/2023  Medication Sig   albuterol (VENTOLIN HFA) 108 (90 Base) MCG/ACT inhaler USE 2 INHALATIONS BY MOUTH EVERY 6 HOURS AS NEEDED FOR WHEEZING  OR SHORTNESS OF BREATH (Patient taking differently: Inhale 2 puffs into the lungs every 6 (six) hours as needed for wheezing.)   Artificial Tear Ointment (DRY EYES OP) Apply 1 drop to eye daily as needed (dry eyes).   Ascorbic Acid (VITAMIN C) 100 MG tablet Take 50 mg by mouth daily.   aspirin EC 81 MG tablet Take 1 tablet (81 mg total) by mouth daily. Swallow whole.   cholecalciferol (VITAMIN D3) 25 MCG (1000 UNIT) tablet Take 1 tablet (1,000 Units total) by mouth daily.   ezetimibe (ZETIA) 10 MG tablet Take 1 tablet (10 mg total) by mouth daily.   fexofenadine (ALLEGRA) 180 MG tablet Take 1 tablet (180 mg  total) by mouth daily.   Fluticasone-Umeclidin-Vilant (TRELEGY ELLIPTA) 200-62.5-25 MCG/ACT AEPB USE 1 INHALATION BY MOUTH ONCE  DAILY AT THE SAME TIME EACH DAY   mometasone (NASONEX) 50 MCG/ACT nasal spray PLACE 2 SPRAYS IN EACH NOSTRIL ONCE DAILY AS NEEDED. (Patient taking differently: Place 2 sprays into the nose daily.)   Multiple Vitamins-Minerals (MULTIVITAMIN WITH MINERALS) tablet Take 1 tablet by mouth daily.   pantoprazole (PROTONIX) 40 MG tablet Take 1 tablet (40 mg total) by mouth 2 (two) times daily before a meal for 15 days.   No facility-administered encounter medications on file as of 03/05/2023.    Allergies (verified) Dicyclomine, Hydrocodone, Lipitor [atorvastatin], Penicillins, and Levsin [hyoscyamine]   History: Past Medical History:  Diagnosis Date   Abdominal pain 08/16/2008   Qualifier: Diagnosis of  By: Daphine Deutscher FNP, [name]     Abdominal pain, epigastric 09/08/2019   Allergy    seasonal, PCN, antidepressants, bentyl   Aortic atherosclerosis (HCC) 02/23/2021   Arthritis    Asthma    Asymptomatic bacteriuria 08/24/2021   UA showed leukocytes but patient does not have dysuria, fevers or hematuria. She also had bacteruria in her last UA. Her symptoms of fatigue, and loss of appetite are  non-specific but if persist or she begins having dysuria we can work up further. A UTI would not explain the abdominal pain patient is having.    Carpal tunnel syndrome on both sides    right worse than left 2008   Cataract 06/11/2013   COPD (chronic obstructive pulmonary disease) (HCC)    COVID-19 virus infection 06/18/2016   Eczema 05/02/2018   Food poisoning 04/04/2021   GERD (gastroesophageal reflux disease)    Hair loss 07/07/2019   Hyperlipidemia    LDL 108 JULY 2013   IBS (irritable bowel syndrome)    Macular rash 05/20/2019   Multiple nevi 03/01/2015   Pneumonia    Positive TB test    From MArch 2009 note : Recent F/u at [facility]. CXRAy negative.  Positive TST in 2007 (14mm).   Unable to tolerate INH due to hepatotoxicity   Primary insomnia 06/05/2012   Skin macule 04/21/2019   Tinnitus 07/07/2019   URI (upper respiratory infection) 11/01/2010   Qualifier: Diagnosis of  By: Daphine Deutscher FNP, Zena Amos     Past Surgical History:  Procedure Laterality Date   BREAST CYST EXCISION Right 2011   BREAST EXCISIONAL BIOPSY     benign   BREAST EXCISIONAL BIOPSY Right 1990   benign   INDUCED ABORTION  2005   VAGINAL HYSTERECTOMY  1983   partial; for abnormal bleeding   Family History  Problem Relation Age of Onset   Hypertension Mother    Heart disease Mother    Diabetes Mother    Arthritis Mother    Stroke Father    Diabetes Father    Heart disease Father    Hyperlipidemia Father    Hypertension Father    Kidney disease Father    Hypertension Sister    Breast cancer Sister    Breast cancer Sister    Hypertension Sister    Arthritis Sister    Hypertension Sister    Diabetes Sister    Mental illness Brother    Aneurysm Brother        brain   Arthritis Brother    Hypertension Maternal Aunt    Hypertension Maternal Uncle    Stomach cancer Maternal Uncle    Hypertension Paternal Aunt    Kidney disease Paternal Aunt        x 2   Hypertension Paternal Uncle    Kidney disease Paternal Uncle        x 2   Colon cancer Neg Hx    Esophageal cancer Neg Hx    Pancreatic cancer Neg Hx    Social History   Socioeconomic History   Marital status: Divorced    Spouse name: Not on file   Number of children: 2   Years of education: Not on file   Highest education level: Not on file  Occupational History   Occupation: retired  Tobacco Use   Smoking status: Former    Packs/day: 0.33    Years: 43.00    Additional pack years: 0.00    Total pack years: 14.19    Types: Cigarettes    Quit date: 01/06/2013    Years since quitting: 10.1   Smokeless tobacco: Never  Vaping Use   Vaping Use: Never used  Substance and Sexual Activity   Alcohol use: No    Alcohol/week:  0.0 standard drinks of alcohol   Drug use: Not Currently    Comment: x 26 yrs.   Sexual activity: Not on file    Comment: S/P PARTIAL HYSTERECTOMY  Other Topics Concern   Not on file  Social History Narrative   Not on file   Social Determinants of Health   Financial Resource Strain: Low Risk  (03/05/2023)   Overall Financial Resource Strain (CARDIA)    Difficulty of Paying Living Expenses: Not very hard  Food Insecurity: No Food Insecurity (03/05/2023)   Hunger Vital Sign    Worried About Running Out of Food in the Last Year: Never true    Ran Out of Food in the Last Year: Never true  Transportation Needs: No Transportation Needs (03/05/2023)   PRAPARE - Administrator, Civil Service (Medical): No    Lack of Transportation (Non-Medical): No  Physical Activity: Insufficiently Active (03/05/2023)   Exercise Vital Sign    Days of Exercise per Week: 2 days    Minutes of Exercise per Session: 30 min  Stress: No Stress Concern Present (03/05/2023)   [employer] of Occupational Health - Occupational Stress Questionnaire    Feeling of Stress : Only a little  Social Connections: Unknown (03/05/2023)   Social Connection and Isolation Panel [NHANES]    Frequency of Communication with Friends and Family: Patient declined    Frequency of Social Gatherings with Friends and Family: Patient declined    Attends Religious Services: More than 4 times per year    Active Member of [employer] or Organizations: Yes    Attends Engineer, structural: More than 4 times per year    Marital Status: Divorced    Tobacco Counseling Counseling given: Not Answered   Clinical Intake:  Pre-visit preparation completed: Yes  Pain : No/denies pain Pain Score: 0-No pain     Nutritional Risks: None Diabetes: No  How often do you need to have someone help you when you read instructions, pamphlets, or other written materials from your doctor or pharmacy?: 1 - Never What is the last grade  level you completed in school?: 13 years  Interpreter Needed?: No  Information entered by :: Peta Peachey,cma   Activities of Daily Living    03/05/2023    1:24 PM 03/05/2023    1:23 PM  In your present state of health, do you have any difficulty performing the following activities:  Hearing? 1 0  Vision?  0  Difficulty concentrating or making decisions? 0 0  Walking or climbing stairs? 1 0  Dressing or bathing? 0 0  Doing errands, shopping? 0 0    Patient Care Team: Modena Slater, DO as PCP - General [name], MD as PCP - Cardiology (Cardiology)  Indicate any recent Medical Services you may have received from other than [facility] providers in the past year (date may be approximate).     Assessment:   This is a routine wellness examination for Aimee Davis.  Hearing/Vision screen No results found.  Dietary issues and exercise activities discussed:     Goals Addressed   None   Depression Screen    03/05/2023    1:24 PM 07/16/2022    4:29 PM 07/02/2022    2:14 PM 03/21/2022   10:13 AM 09/27/2021    1:31 PM 08/22/2021    3:08 PM 08/22/2021    3:07 PM  PHQ 2/9 Scores  PHQ - 2 Score 0 1 2 0 0 0 0  PHQ- 9 Score  4 8  3  0     Fall Risk    03/05/2023    1:24 PM 03/05/2023   11:14 AM 07/16/2022    1:17 PM 07/02/2022  1:34 PM 03/21/2022   10:13 AM  Fall Risk   Falls in the past year? 0 0 0 0 0  Number falls in past yr: 0  0 0 0  Injury with Fall? 0  0  0  Risk for fall due to : No Fall Risks No Fall Risks No Fall Risks;Other (Comment) Other (Comment) No Fall Risks  Risk for fall due to: Comment    weakness   Follow up Falls evaluation completed;Falls prevention discussed Falls evaluation completed Falls evaluation completed Falls evaluation completed Falls evaluation completed;Falls prevention discussed    MEDICARE RISK AT HOME:   TIMED UP AND GO:  Was the test performed? No    Cognitive Function:        03/05/2023    1:24 PM  6CIT Screen  What Year? 0  points  What month? 0 points  What time? 0 points  Count back from 20 0 points  Months in reverse 0 points  Repeat phrase 0 points  Total Score 0 points    Immunizations Immunization History  Administered Date(s) Administered   Influenza Split 06/05/2012   Influenza Whole 08/04/2009, 06/16/2010   Influenza,inj,Quad PF,6+ Mos 07/14/2013, 08/10/2014, 06/24/2018, 06/16/2019, 06/20/2020   PFIZER(Purple Top)SARS-COV-2 Vaccination 11/27/2019, 12/21/2019, 09/23/2020   Pneumococcal Polysaccharide-23 11/16/2002   Td 12/16/2004   Tdap 04/14/2015    TDAP status: Up to date  Flu Vaccine status: Up to date  Pneumococcal vaccine status: Up to date  Covid-19 vaccine status: Completed vaccines  Qualifies for Shingles Vaccine? No   Zostavax completed No   Shingrix Completed?: No.    Education has been provided regarding the importance of this vaccine. Patient has been advised to call insurance company to determine out of pocket expense if they have not yet received this vaccine. Advised may also receive vaccine at local pharmacy or Health Dept. Verbalized acceptance and understanding.  Screening Tests Health Maintenance  Topic Date Due   Zoster Vaccines- Shingrix (1 of 2) Never done   COVID-19 Vaccine (4 - 2023-24 season) 05/18/2022   Pneumonia Vaccine 81+ Years old (2 of 2 - PCV) 07/17/2023 (Originally 11/16/2003)   INFLUENZA VACCINE  04/18/2023   Medicare Annual Wellness (AWV)  03/04/2024   MAMMOGRAM  12/23/2024   DTaP/Tdap/Td (3 - Td or Tdap) 04/13/2025   Colonoscopy  11/25/2025   DEXA SCAN  Completed   Hepatitis C Screening  Completed   HPV VACCINES  Aged Out    Health Maintenance  Health Maintenance Due  Topic Date Due   Zoster Vaccines- Shingrix (1 of 2) Never done   COVID-19 Vaccine (4 - 2023-24 season) 05/18/2022    Colorectal cancer screening: Type of screening: Colonoscopy. Completed 12/07/2018. Repeat every 7 years  Mammogram status: Completed 12/24/2022. Repeat  every year  Lung Cancer Screening: (Low Dose CT Chest recommended if Age 61-80 years, 20 pack-year currently smoking OR have quit w/in 15years.) does not qualify.   Lung Cancer Screening Referral: N/A  Additional Screening:  Hepatitis C Screening: does not qualify; Completed 05/27/2018  Vision Screening: Recommended annual ophthalmology exams for early detection of glaucoma and other disorders of the eye. Is the patient up to date with their annual eye exam?  Yes  Who is the provider or what is the name of the office in which the patient attends annual eye exams? N/A If pt is not established with a provider, would they like to be referred to a provider to establish care? No .   Dental Screening: Recommended  annual dental exams for proper oral hygiene    Community Resource Referral / Chronic Care Management: CRR required this visit?  No   CCM required this visit?  No     Plan:     I have personally reviewed and noted the following in the patient's chart:   Medical and social history Use of alcohol, tobacco or illicit drugs  Current medications and supplements including opioid prescriptions. Patient is not currently taking opioid prescriptions. Functional ability and status Nutritional status Physical activity Advanced directives List of other physicians Hospitalizations, surgeries, and ER visits in previous 12 months Vitals Screenings to include cognitive, depression, and falls Referrals and appointments  In addition, I have reviewed and discussed with patient certain preventive protocols, quality metrics, and best practice recommendations. A written personalized care plan for preventive services as well as general preventive health recommendations were provided to patient.     Cala Bradford, CMA   03/05/2023   After Visit Summary: (MyChart) Due to this being a telephonic visit, the after visit summary with patients personalized plan was offered to patient via MyChart    Nurse Notes: Face-To-Face Visit  Ms. Hyacinth Meeker , Thank you for taking time to come for your Medicare Wellness Visit. I appreciate your ongoing commitment to your health goals. Please review the following plan we discussed and let me know if I can assist you in the future.   These are the goals we discussed:  Goals      Quit smoking / using tobacco        This is a list of the screening recommended for you and due dates:  Health Maintenance  Topic Date Due   Zoster (Shingles) Vaccine (1 of 2) Never done   COVID-19 Vaccine (4 - 2023-24 season) 05/18/2022   Pneumonia Vaccine (2 of 2 - PCV) 07/17/2023*   Flu Shot  04/18/2023   Medicare Annual Wellness Visit  03/04/2024   Mammogram  12/23/2024   DTaP/Tdap/Td vaccine (3 - Td or Tdap) 04/13/2025   Colon Cancer Screening  11/25/2025   DEXA scan (bone density measurement)  Completed   Hepatitis C Screening  Completed   HPV Vaccine  Aged Out  *Topic was postponed. The date shown is not the original due date.

## 2023-03-05 NOTE — Progress Notes (Signed)
CC: Hospital follow-up  HPI:  Aimee Davis is a 67 y.o. female with PMH as below who presents to clinic to follow-up after recent hospitalization for palpitations. Please see problem based charting for evaluation, assessment and plan.  Past Medical History:  Diagnosis Date   Abdominal pain 08/16/2008   Qualifier: Diagnosis of  By: Daphine Deutscher FNP, [name]     Abdominal pain, epigastric 09/08/2019   Allergy    seasonal, PCN, antidepressants, bentyl   Aortic atherosclerosis (HCC) 02/23/2021   Arthritis    Asthma    Asymptomatic bacteriuria 08/24/2021   UA showed leukocytes but patient does not have dysuria, fevers or hematuria. She also had bacteruria in her last UA. Her symptoms of fatigue, and loss of appetite are non-specific but if persist or she begins having dysuria we can work up further. A UTI would not explain the abdominal pain patient is having.    Carpal tunnel syndrome on both sides    right worse than left 2008   Cataract 06/11/2013   COPD (chronic obstructive pulmonary disease) (HCC)    COVID-19 virus infection 06/18/2016   Eczema 05/02/2018   Food poisoning 04/04/2021   GERD (gastroesophageal reflux disease)    Hair loss 07/07/2019   Hyperlipidemia    LDL 108 JULY 2013   IBS (irritable bowel syndrome)    Macular rash 05/20/2019   Multiple nevi 03/01/2015   Pneumonia    Positive TB test    From MArch 2009 note : Recent F[facility]. CXRAy negative.  Positive TST in 2007 (14mm).  Unable to tolerate INH due to hepatotoxicity   Primary insomnia 06/05/2012   Skin macule 04/21/2019   Tinnitus 07/07/2019   URI (upper respiratory infection) 11/01/2010   Qualifier: Diagnosis of  By: Daphine Deutscher FNP, Zena Amos      Review of Systems:  Constitutional: Positive for chronic fatigue. Negative for fevers or chills. Eyes: Negative for visual changes Respiratory: Negative for shortness of breath. Positive for occasional dry cough Cardiac: Positive for occasional palpitations. Negative for  chest pain Abdomen: Positive for intermittent constipation and diarrhea.  Neuro: Negative for headache or weakness  Physical Exam: General: Pleasant, well-appearing elderly woman. No acute distress. Cardiac: Regular rate and rhythm. No murmurs, rubs or gallops. No LE edema Respiratory: Lungs CTAB. No wheezing, rhonchi or crackles. Abdominal: Soft, symmetric and non tender. Normal BS. Skin: Warm, dry and intact without rashes or lesions Extremities: Atraumatic. Full ROM. Palpable radial and DP pulses. Neuro: A&O x 3. Moves all extremities. Normal sensation to gross touch. Psych: Appropriate mood and affect.  Vitals:   03/05/23 1110  BP: 115/61  Pulse: 68  Temp: 98.1 F (36.7 C)  TempSrc: Oral  SpO2: 97%  Weight: 148 lb 6.4 oz (67.3 kg)  Height: 5\' 3"  (1.6 m)    Assessment & Plan:   Palpitations Patient here to follow-up after recent hospitalization from 4/22 to 4/25 for palpitations. Patient reports her palpitations started 3 to 4 weeks prior to presenting to [facility]. She was evaluated by cardiology in March with lab work and a Cytogeneticist. Patient wore the Zio patch for 3 days and it showed mostly sinus rhythm with PACs as well as 2 short runs of SVTs.  Workup during her hospitalizations including normal TSH, troponins, electrolytes, and kidney function. EKG showed sinus rhythm with nonspecific ST changes. Neggative CXR.  CTA chest was negative for PE. TTE showed EF 55-60% and no valvular pathology. Cardiology was consulted and they did not  recommend any further work up. Patient has had intemittent "flutter feeling in my chest" since hospital discharge. They last a few seconds and she often has mild SOB with them. She denies any chest pain, lightheadedness, headaches or SOB outside the episodes. Her HR is 68 today and I do not appreciate any irregular rhythm on heart auscultation. She has follow up with cardiology in 2 weeks. She will likely benefit from a PRN beta blocker.    Plan: -Follow up with cardiology as scheduled on 7/5  Upper respiratory infection, viral This has improved. Likely secondary to allergies. She has been taking her allergra daily and using her inhalers as prescribed. She reports using her albuterol more especially since she has been exercising more. She has not rhonchi or wheezing on exam. I have advised her to continue her allergy medication and inhalers.    Irritable bowel syndrome She is still interested in seeing a dietician to help her figure out what to eat but this service has been denied by her insurance. She has follow up with GI on 6/27 that was recently cancelled. Patient states she did not cancel the appointment so I advised her to call the office to keep her appointment.  -Follow up with GI -Continue FODMAP diet   See Encounters Tab for problem based charting.  Patient discussed with Dr.  Oretha Milch, MD, MPH

## 2023-03-05 NOTE — Patient Instructions (Signed)
Thank you, [name] for allowing [place] to provide your care today. Today we discussed your recent hospitalization for heart palpitations. Your heart rate is normal today. Please make sure to follow-up with cardiology as scheduled next month. Make sure to call the GI office to ensure you are able to see them to evaluate your IBS.  My Chart Access: [email or web address]  Please follow-up in 3 months  Please make sure to arrive 15 minutes prior to your next appointment. If you arrive late, you may be asked to reschedule.    We look forward to seeing you next time. Please call our clinic at [phone] if you have any questions or concerns. The best time to call is Monday-Friday from 9am-4pm, but there is someone available 24/7. If after hours or the weekend, call the main hospital number and ask for the Internal Medicine Resident On-Call. If you need medication refills, please notify your pharmacy one week in advance and they will send [place] a request.   Thank you for letting us take part in your care. Wishing you the best!  Steffanie Rainwater, MD 03/05/2023, 11:40 AM IM Resident, PGY-3 [name] 41:10

## 2023-03-05 NOTE — Addendum Note (Signed)
Addended bySharrell Ku on: 03/05/2023 01:36 PM   Modules accepted: Level of Service

## 2023-03-07 ENCOUNTER — Telehealth: Payer: Self-pay | Admitting: Student

## 2023-03-07 NOTE — Progress Notes (Signed)
Internal Medicine Clinic Attending  Case discussed with Dr. Amponsah  At the time of the visit.  We reviewed the resident's history and exam and pertinent patient test results.  I agree with the assessment, diagnosis, and plan of care documented in the resident's note.  

## 2023-03-07 NOTE — Telephone Encounter (Signed)
Return pt's call. Stated she's having a terrible time with her stomach. Stated ? IBS flare-up. Stated she was able to eat an egg/toast last night and a smoothie w/o any problems. Also took peppermint tabs and probiotic. Until she drank a diary-free Ensure and the stomach pain started again. Stated she does not see the GI doctor until September. She also want to know if she can see a dietician; stated she's  unsure what to eat and not to eat. She does not know what to do until the GI appt.

## 2023-03-07 NOTE — Addendum Note (Signed)
Addended by: Dickie La on: 03/07/2023 11:24 AM   Modules accepted: Level of Service

## 2023-03-07 NOTE — Telephone Encounter (Signed)
Pt  is requesting a call back. Pt having a lot of stomach pain.  Pt unable to get up or eat.  Pt states she did eat 1 egg and a piece of bread last night.  Pt states her balance is off when she stands up. Please call back.

## 2023-03-07 NOTE — Progress Notes (Signed)
Internal Medicine Clinic Attending  Case and documentation of Dr. Amponsah  reviewed.  I reviewed the AWV findings.  I agree with the assessment, diagnosis, and plan of care documented in the AWV note.     

## 2023-03-14 ENCOUNTER — Ambulatory Visit: Payer: 59 | Admitting: Gastroenterology

## 2023-03-18 ENCOUNTER — Telehealth: Payer: Self-pay

## 2023-03-18 NOTE — Telephone Encounter (Signed)
Did you or someone called the pt? Thanks

## 2023-03-18 NOTE — Telephone Encounter (Signed)
Pt is requesting a call  back .Marland Kitchen She stated that she missed a call from a Dr here  she can't make out the dr name but it does state to call the clinic back

## 2023-03-20 NOTE — Progress Notes (Signed)
Cardiology Office Note:    Date:  03/22/2023   ID:  Aimee Davis, DOB 11-Oct-1955, MRN [id number]  PCP:  Modena Slater, DO   [facility]  Cardiologist:  [name], MD  Advanced Practice Provider:  No care team member to display Electrophysiologist:  None   Referring MD: Steffanie Rainwater, MD    History of Present Illness:    Aimee Davis is a 67 y.o. female with a hx of chronic fatigue, GERD and asthma who presents for follow-up.   Patient initially seen in clinic where she was being seen for DOE and chest pain. Coronary CTA 11/2020 with minimal disease in RCA. Ca score 90 (83% for age and sex matched control). TTE 12/2020 with LVEF 60-65%, normal RV, no significant valve disease. It was deemed her symptoms were not cardiac in nature.  Was admitted in 12/2022 for chest pressure. Deemed to be noncardiac in nature given extensive prior reassuring work-up.  Today, the patient overall feels okay. Reports she has been having episodes of intermittent palpitations that has been ongoing since her hospitalization. Cardiac monitor with occasional SVE but no significant arrhythmias. Symptoms seem to increase with her inhaler use. Otherwise, no chest pain, orthopnea or PND. Continues to have dyspnea on exertion that has worsened in the summer months. Has been using her inhaler more. She is hoping to be referred to a Pulmonologist to assess further.   Has tried metop in the past but made her fatigued  Past Medical History:  Diagnosis Date   Abdominal pain 08/16/2008   Qualifier: Diagnosis of  By: Daphine Deutscher FNP, [name]     Abdominal pain, epigastric 09/08/2019   Allergy    seasonal, PCN, antidepressants, bentyl   Aortic atherosclerosis (HCC) 02/23/2021   Arthritis    Asthma    Asymptomatic bacteriuria 08/24/2021   UA showed leukocytes but patient does not have dysuria, fevers or hematuria. She also had bacteruria in her last UA. Her symptoms of fatigue, and  loss of appetite are non-specific but if persist or she begins having dysuria we can work up further. A UTI would not explain the abdominal pain patient is having.    Carpal tunnel syndrome on both sides    right worse than left 2008   Cataract 06/11/2013   COPD (chronic obstructive pulmonary disease) (HCC)    COVID-19 virus infection 06/18/2016   Eczema 05/02/2018   Food poisoning 04/04/2021   GERD (gastroesophageal reflux disease)    Hair loss 07/07/2019   Hyperlipidemia    LDL 108 JULY 2013   IBS (irritable bowel syndrome)    Macular rash 05/20/2019   Multiple nevi 03/01/2015   Pneumonia    Positive TB test    From MArch 2009 note : Recent F/u at [facility]. CXRAy negative.  Positive TST in 2007 (14mm).  Unable to tolerate INH due to hepatotoxicity   Primary insomnia 06/05/2012   Skin macule 04/21/2019   Tinnitus 07/07/2019   URI (upper respiratory infection) 11/01/2010   Qualifier: Diagnosis of  By: Daphine Deutscher FNP, Zena Amos      Past Surgical History:  Procedure Laterality Date   BREAST CYST EXCISION Right 2011   BREAST EXCISIONAL BIOPSY     benign   BREAST EXCISIONAL BIOPSY Right 1990   benign   INDUCED ABORTION  2005   VAGINAL HYSTERECTOMY  1983   partial; for abnormal bleeding    Current Medications: Current Meds  Medication Sig   albuterol (VENTOLIN HFA) 108 (90  Base) MCG/ACT inhaler USE 2 INHALATIONS BY MOUTH EVERY 6 HOURS AS NEEDED FOR WHEEZING  OR SHORTNESS OF BREATH (Patient taking differently: Inhale 2 puffs into the lungs every 6 (six) hours as needed for wheezing.)   Artificial Tear Ointment (DRY EYES OP) Apply 1 drop to eye daily as needed (dry eyes).   Ascorbic Acid (VITAMIN C) 100 MG tablet Take 50 mg by mouth daily.   aspirin EC 81 MG tablet Take 1 tablet (81 mg total) by mouth daily. Swallow whole.   carboxymethylcellul-glycerin (OPTIVE) 0.5-0.9 % ophthalmic solution 1 drop 4 (four) times daily as needed for dry eyes.   cholecalciferol (VITAMIN D3) 25 MCG (1000 UNIT)  tablet Take 1 tablet (1,000 Units total) by mouth daily.   diltiazem (CARDIZEM) 30 MG tablet Take 0.5 tablets (15 mg total) by mouth as needed. 2 times daily as needed for palpitations   ezetimibe (ZETIA) 10 MG tablet Take 1 tablet (10 mg total) by mouth daily.   fexofenadine (ALLEGRA) 180 MG tablet Take 1 tablet (180 mg total) by mouth daily.   Fluticasone-Umeclidin-Vilant (TRELEGY ELLIPTA) 200-62.5-25 MCG/ACT AEPB USE 1 INHALATION BY MOUTH ONCE  DAILY AT THE SAME TIME EACH DAY   mometasone (NASONEX) 50 MCG/ACT nasal spray PLACE 2 SPRAYS IN EACH NOSTRIL ONCE DAILY AS NEEDED. (Patient taking differently: Place 2 sprays into the nose daily.)   Multiple Vitamins-Minerals (MULTIVITAMIN WITH MINERALS) tablet Take 1 tablet by mouth daily.     Allergies:   Dicyclomine, Hydrocodone, Lipitor [atorvastatin], Penicillins, and Levsin [hyoscyamine]   Social History   Socioeconomic History   Marital status: Divorced    Spouse name: Not on file   Number of children: 2   Years of education: Not on file   Highest education level: Not on file  Occupational History   Occupation: retired  Tobacco Use   Smoking status: Former    Packs/day: 0.33    Years: 43.00    Additional pack years: 0.00    Total pack years: 14.19    Types: Cigarettes    Quit date: 01/06/2013    Years since quitting: 10.2   Smokeless tobacco: Never  Vaping Use   Vaping Use: Never used  Substance and Sexual Activity   Alcohol use: No    Alcohol/week: 0.0 standard drinks of alcohol   Drug use: Not Currently    Comment: x 26 yrs.   Sexual activity: Not on file    Comment: S/P PARTIAL HYSTERECTOMY  Other Topics Concern   Not on file  Social History Narrative   Not on file   Social Determinants of Health   Financial Resource Strain: Low Risk  (03/05/2023)   Overall Financial Resource Strain (CARDIA)    Difficulty of Paying Living Expenses: Not very hard  Food Insecurity: No Food Insecurity (03/05/2023)   Hunger Vital Sign     Worried About Running Out of Food in the Last Year: Never true    Ran Out of Food in the Last Year: Never true  Transportation Needs: No Transportation Needs (03/05/2023)   PRAPARE - Administrator, Civil Service (Medical): No    Lack of Transportation (Non-Medical): No  Physical Activity: Insufficiently Active (03/05/2023)   Exercise Vital Sign    Days of Exercise per Week: 2 days    Minutes of Exercise per Session: 30 min  Stress: No Stress Concern Present (03/05/2023)   [employer] of Occupational Health - Occupational Stress Questionnaire    Feeling of Stress : Only a  little  Social Connections: Unknown (03/05/2023)   Social Connection and Isolation Panel [NHANES]    Frequency of Communication with Friends and Family: Patient declined    Frequency of Social Gatherings with Friends and Family: Patient declined    Attends Religious Services: More than 4 times per year    Active Member of [employer] or Organizations: Yes    Attends Engineer, structural: More than 4 times per year    Marital Status: Divorced     Family History: The patient's family history includes Aneurysm in her brother; Arthritis in her brother, mother, and sister; Breast cancer in her sister and sister; Diabetes in her father, mother, and sister; Heart disease in her father and mother; Hyperlipidemia in her father; Hypertension in her father, maternal aunt, maternal uncle, mother, paternal aunt, paternal uncle, sister, sister, and sister; Kidney disease in her father, paternal aunt, and paternal uncle; Mental illness in her brother; Stomach cancer in her maternal uncle; Stroke in her father. There is no history of Colon cancer, Esophageal cancer, or Pancreatic cancer.  ROS:   Please see the history of present illness.      EKGs/Labs/Other Studies Reviewed:    The following studies were reviewed today:   Cardiac Studies & Procedures       ECHOCARDIOGRAM  ECHOCARDIOGRAM COMPLETE  01/09/2023  Narrative ECHOCARDIOGRAM REPORT    Patient Name:   MAYU ROCKHOLT Date of Exam: 01/09/2023 Medical Rec #:  [id number]       Height:       63.0 in Accession #:    [id number]      Weight:       143.1 lb Date of Birth:  1956/02/17      BSA:          1.677 m Patient Age:    66 years        BP:           119/63 mmHg Patient Gender: F               HR:           64 bpm. Exam Location:  Inpatient  Procedure: 2D Echo, Cardiac Doppler, Color Doppler and 3D Echo  Indications:    Chest pain  History:        Patient has prior history of Echocardiogram examinations, most recent 12/20/2020. COPD, Signs/Symptoms:Chest Pain; Risk Factors:Dyslipidemia.  Sonographer:    Milda Smart Referring Phys: [id number] Angie Fava   Sonographer Comments: Image acquisition challenging due to respiratory motion. IMPRESSIONS   1. Left ventricular ejection fraction, by estimation, is 55 to 60%. Left ventricular ejection fraction by 3D volume is 57 %. The left ventricle has normal function. The left ventricle has no regional wall motion abnormalities. Left ventricular diastolic parameters were normal. 2. Right ventricular systolic function is normal. The right ventricular size is normal. There is normal pulmonary artery systolic pressure. The estimated right ventricular systolic pressure is 25.1 mmHg. 3. The mitral valve is normal in structure. Mild mitral valve regurgitation. No evidence of mitral stenosis. 4. The aortic valve is tricuspid. Aortic valve regurgitation is not visualized. Aortic valve sclerosis is present, with no evidence of aortic valve stenosis. 5. The inferior vena cava is normal in size with greater than 50% respiratory variability, suggesting right atrial pressure of 3 mmHg. 6. Possible trivial circumferential pericardial effusion is present.  FINDINGS Left Ventricle: Left ventricular ejection fraction, by estimation, is 55 to 60%. Left ventricular ejection fraction by 3D  volume is 57 %. The left ventricle has normal function. The left ventricle has no regional wall motion abnormalities. The left ventricular internal cavity size was normal in size. There is no left ventricular hypertrophy. Left ventricular diastolic parameters were normal. Normal left ventricular filling pressure.  Right Ventricle: The right ventricular size is normal. No increase in right ventricular wall thickness. Right ventricular systolic function is normal. There is normal pulmonary artery systolic pressure. The tricuspid regurgitant velocity is 2.35 m/s, and with an assumed right atrial pressure of 3 mmHg, the estimated right ventricular systolic pressure is 25.1 mmHg.  Left Atrium: Left atrial size was normal in size.  Right Atrium: Right atrial size was normal in size.  Pericardium: Trivial pericardial effusion is present. The pericardial effusion is circumferential.  Mitral Valve: The mitral valve is normal in structure. Mild mitral valve regurgitation. No evidence of mitral valve stenosis. MV peak gradient, 5.3 mmHg. The mean mitral valve gradient is 3.0 mmHg.  Tricuspid Valve: The tricuspid valve is normal in structure. Tricuspid valve regurgitation is mild . No evidence of tricuspid stenosis.  Aortic Valve: The aortic valve is tricuspid. Aortic valve regurgitation is not visualized. Aortic valve sclerosis is present, with no evidence of aortic valve stenosis.  Pulmonic Valve: The pulmonic valve was normal in structure. Pulmonic valve regurgitation is mild. No evidence of pulmonic stenosis.  Aorta: The aortic root is normal in size and structure.  Venous: The inferior vena cava is normal in size with greater than 50% respiratory variability, suggesting right atrial pressure of 3 mmHg.  IAS/Shunts: No atrial level shunt detected by color flow Doppler.   LEFT VENTRICLE PLAX 2D LVIDd:         3.70 cm         Diastology LVIDs:         2.40 cm         LV e' medial:    7.72  cm/s LV PW:         0.80 cm         LV E/e' medial:  13.1 LV IVS:        0.90 cm         LV e' lateral:   9.36 cm/s LVOT diam:     2.20 cm         LV E/e' lateral: 10.8 LV SV:         104 LV SV Index:   62 LVOT Area:     3.80 cm        3D Volume EF LV 3D EF:    Left ventricul ar ejection fraction by 3D volume is 57 %.  3D Volume EF: 3D EF:        57 % LV EDV:       98 ml LV ESV:       42 ml LV SV:        56 ml  RIGHT VENTRICLE             IVC RV S prime:     12.40 cm/s  IVC diam: 1.80 cm TAPSE (M-mode): 1.9 cm  LEFT ATRIUM             Index        RIGHT ATRIUM           Index LA diam:        2.90 cm 1.73 cm/m   RA Area:     10.80 cm LA Vol (A2C):   36.1  ml 21.52 ml/m  RA Volume:   23.00 ml  13.71 ml/m LA Vol (A4C):   25.6 ml 15.26 ml/m LA Biplane Vol: 30.8 ml 18.36 ml/m AORTIC VALVE             PULMONIC VALVE LVOT Vmax:   145.00 cm/s PR End Diast Vel: 3.11 msec LVOT Vmean:  86.500 cm/s LVOT VTI:    0.273 m  AORTA Ao Root diam: 3.40 cm Ao Asc diam:  3.20 cm  MITRAL VALVE                  TRICUSPID VALVE MV Area (PHT): 3.24 cm       TR Peak grad:   22.1 mmHg MV Area VTI:   2.72 cm       TR Mean grad:   17.0 mmHg MV Peak grad:  5.3 mmHg       TR Vmax:        235.00 cm/s MV Mean grad:  3.0 mmHg       TR Vmean:       198.0 cm/s MV Vmax:       1.15 m/s MV Vmean:      76.6 cm/s      SHUNTS MV Decel Time: 234 msec       Systemic VTI:  0.27 m MR Peak grad:    105.7 mmHg   Systemic Diam: 2.20 cm MR Mean grad:    77.0 mmHg MR Vmax:         514.00 cm/s MR Vmean:        425.5 cm/s MR PISA:         1.01 cm MR PISA Eff ROA: 8 mm MR PISA Radius:  0.40 cm MV E velocity: 101.00 cm/s MV A velocity: 78.60 cm/s MV E/A ratio:  1.28  Armanda Magic MD Electronically signed by Armanda Magic MD Signature Date/Time: 01/09/2023/12:22:20 PM    Final    MONITORS  LONG TERM MONITOR (3-14 DAYS) 12/05/2022  Narrative Patch Wear Time:  2 days and 21 hours  (2024-03-12T11:20:45-0400 to 2024-03-15T09:07:02-399)  Patient had a min HR of 51 bpm, max HR of 154 bpm, and avg HR of 82 bpm. Predominant underlying rhythm was Sinus Rhythm.  EVENTS: -2 Supraventricular Tachycardia runs occurred, the run with the fastest interval lasting 8 beats with a max rate of 154 bpm (avg 137 bpm); the run with the fastest interval was also the longest.  -Isolated SVEs were occasional (1.2%, 3496), SVE Couplets were rare (<1.0%, 1), and SVE Triplets were rare (<1.0%, 1). Isolated VEs were rare (<1.0%), and no VE Couplets or VE Triplets were present.  No atrial fibrillation, sustained ventricular tachyarrhythmias, or bradyarrhythmias were detected.  Patient-triggered events corresponded with sinus rhythm and PACs.   CT SCANS  CT CORONARY MORPH W/CTA COR W/SCORE 12/08/2020  Addendum 12/08/2020  3:23 PM ADDENDUM REPORT: 12/08/2020 15:21  CLINICAL DATA:  67 year old female with family history of early CAD and chest pain.  EXAM: Cardiac/Coronary  CTA  TECHNIQUE: The patient was scanned on a Siemens [employer].  FINDINGS: A 100 kV prospective scan was triggered in the descending thoracic aorta at 111 HU's. Axial non-contrast 3 mm slices were carried out through the heart. The data set was analyzed on a dedicated work station and scored using the Agatson method. Gantry rotation speed was 250 msecs and collimation was .6 mm. 25 mg of PO Metoprolol and 0.8 mg of sl NTG were given. The 3D data set was  reconstructed in 5% intervals of the 67-82 % of the R-R cycle. Diastolic phases were analyzed on a dedicated work station using MPR, MIP and VRT modes. The patient received 80 cc of contrast.  Aorta: Normal size. Mild diffuse atherosclerotic plaque. No dissection.  Aortic Valve: Trileaflet.  Trivial calcifications.  Coronary Arteries:  Normal coronary origin.  Right dominance.  RCA is a very large dominant artery that gives rise to PDA and  PLA. There is mild calcified atherosclerotic plaque in the proximal RCA with stenosis <25%, mid and distal RVA have minimal stenoses.  PDA and PLA have no significant plaque.  Left main is a large artery that gives rise to LAD and LCX arteries. Left main has no plaque.  LAD is a large vessel that gives rise to one large diagonal artery and has no plaque.  D1 has no plaque.  LCX is a non-dominant artery that gives rise to one large OM1 branch. There is no plaque.  Other findings:  Normal pulmonary vein drainage into the left atrium.  Normal left atrial appendage without a thrombus.  Normal size of the pulmonary artery.  IMPRESSION: 1. Coronary calcium score of 90. This was 54 percentile for age and sex matched control.  2. Normal coronary origin with right dominance.  3. CAD-RADS 1. Minimal non-obstructive CAD (0-24%). Consider non-atherosclerotic causes of chest pain. Consider preventive therapy and risk factor modification.   Electronically Signed By: Tobias Alexander On: 12/08/2020 15:21  Narrative EXAM: OVER-READ INTERPRETATION  CT CHEST  The following report is an over-read performed by radiologist Dr. Trudie Reed of [facility] on 12/07/2020. This over-read does not include interpretation of cardiac or coronary anatomy or pathology. The coronary calcium score/coronary CTA interpretation by the cardiologist is attached.  COMPARISON:  None.  FINDINGS: Atherosclerotic calcifications in the thoracic aorta. Within the visualized portions of the thorax there are no suspicious appearing pulmonary nodules or masses, there is no acute consolidative airspace disease, no pleural effusions, no pneumothorax and no lymphadenopathy. Visualized portions of the upper abdomen are unremarkable. There are no aggressive appearing lytic or blastic lesions noted in the visualized portions of the skeleton.  IMPRESSION: 1.  Aortic Atherosclerosis  (ICD10-I70.0).  Electronically Signed: By: Trudie Reed M.D. On: 12/07/2020 12:40           EKG:   No new tracing today  Recent Labs: 01/07/2023: B Natriuretic Peptide 14.5; TSH 4.329 01/09/2023: ALT 18; BUN 18; Creatinine, Ser 0.90; Hemoglobin 13.0; Magnesium 2.3; Platelets 273; Potassium 4.4; Sodium 135  Recent Lipid Panel    Component Value Date/Time   CHOL 205 (H) 12/24/2022 1057   TRIG 91 12/24/2022 1057   HDL 108 12/24/2022 1057   CHOLHDL 1.9 12/24/2022 1057   CHOLHDL 2.2 04/14/2015 1642   VLDL 52 (H) 04/14/2015 1642   LDLCALC 81 12/24/2022 1057     Risk Assessment/Calculations:       Physical Exam:    VS:  BP 120/60   Pulse 73   Ht 5' 3.5" (1.613 m)   Wt 149 lb (67.6 kg)   SpO2 97%   BMI 25.98 kg/m     Wt Readings from Last 3 Encounters:  03/22/23 149 lb (67.6 kg)  03/05/23 148 lb 6.4 oz (67.3 kg)  03/05/23 148 lb 6.4 oz (67.3 kg)     GEN:  Comfortable, NAD HEENT: Normal NECK: No JVD; No carotid bruits CARDIAC: RRR, no murmurs RESPIRATORY:  CTAB, good air movement  ABDOMEN: Soft, non-tender, non-distended MUSCULOSKELETAL:  No edema; No deformity  SKIN: Warm and dry NEUROLOGIC:  Alert and oriented x 3 PSYCHIATRIC:  Normal affect   ASSESSMENT:    1. Coronary artery disease involving native coronary artery of native heart without angina pectoris   2. Pure hypercholesterolemia   3. Hyperlipidemia LDL goal <70   4. Dyspnea on exertion   5. Palpitations   6. Hyperlipidemia, unspecified hyperlipidemia type    PLAN:    In order of problems listed above:  #Exertional Chest Pressure #DOE: Reassuring cardiac work-up with only mild RCA disease on coronary CTA with Ca score 90 (83% age, sex matched controls). TTE 12/2020 with normal BiV function with no valve disease. Likely related to underlying asthma. -Will refer to Pulm per patient preference -Reassuring cardiac work-up  #Palpitations: -Cardiac monitor with 1.2% PACs but no sustained  arrhythmias -Reassured her about her monitor results but continues to have intermittent symptoms -Suspect palpitations are worsening in the setting of increased albuterol use -Will give dilt 15mg  BID prn for symptoms; had fatigue with metop in the past  #Mild Nonobstructive CAD: #HLD: #Aortic Atherosclerosis #Statin Intolerance: Ca score 90 (83%) with mild RCA disease. Unable to tolerate crestor, lipitor and simvastatin. Goal LDL <70. Declined PCSK9i as does not want to do injections. -Continue zetia 10mg  daily -Discussed lifestyle modifications as detailed below -Repeat lipids in 3 months  #Asthma: -Having more significant symptoms in the summer months and needing to use rescue inhaler more frequently -Refer to pulm per patient preference  Exercise recommendations: Goal of exercising for at least 30 minutes a day, at least 5 times per week.  Please exercise to a moderate exertion.  This means that while exercising it is difficult to speak in full sentences, however you are not so short of breath that you feel you must stop, and not so comfortable that you can carry on a full conversation.  Exertion level should be approximately a 5/10, if 10 is the most exertion you can perform.  Diet recommendations: Recommend a heart healthy diet such as the Mediterranean diet.  This diet consists of plant based foods, healthy fats, lean meats, olive oil.  It suggests limiting the intake of simple carbohydrates such as white breads, pastries, and pastas.  It also limits the amount of red meat, wine, and dairy products such as cheese that one should consume on a daily basis.   Follow-up in 6 months  Medication Adjustments/Labs and Tests Ordered: Current medicines are reviewed at length with the patient today.  Concerns regarding medicines are outlined above.  Orders Placed This Encounter  Procedures   Lipid panel   Ambulatory referral to Pulmonology   Meds ordered this encounter  Medications    diltiazem (CARDIZEM) 30 MG tablet    Sig: Take 0.5 tablets (15 mg total) by mouth as needed. 2 times daily as needed for palpitations    Dispense:  90 tablet    Refill:  3    Patient Instructions  Medication Instructions:  Your physician has recommended you make the following change in your medication:   Start diltiazem 15 mg (1/2 tablet) 2 times daily as needed for palpitations.  *If you need a refill on your cardiac medications before your next appointment, please call your pharmacy*   Lab Work: Lipids in 3 mths If you have labs (blood work) drawn today and your tests are completely normal, you will receive your results only by: MyChart Message (if you have MyChart) OR A paper copy in the mail If  you have any lab test that is abnormal or we need to change your treatment, we will call you to review the results.  Follow-Up: At [facility], you and your health needs are our priority.  As part of our continuing mission to provide you with exceptional heart care, we have created designated Provider Care Teams.  These Care Teams include your primary Cardiologist (physician) and Advanced Practice Providers (APPs -  Physician Assistants and Nurse Practitioners) who all work together to provide you with the care you need, when you need it.   Your next appointment:   6 month(s)  Provider:   Dr Donell Sievert        Signed, [name], MD  03/22/2023 10:04 AM    [facility]

## 2023-03-22 ENCOUNTER — Ambulatory Visit: Payer: 59 | Attending: Cardiology | Admitting: Cardiology

## 2023-03-22 ENCOUNTER — Encounter: Payer: Self-pay | Admitting: Cardiology

## 2023-03-22 VITALS — BP 120/60 | HR 73 | Ht 63.5 in | Wt 149.0 lb

## 2023-03-22 DIAGNOSIS — E785 Hyperlipidemia, unspecified: Secondary | ICD-10-CM | POA: Diagnosis not present

## 2023-03-22 DIAGNOSIS — R002 Palpitations: Secondary | ICD-10-CM

## 2023-03-22 DIAGNOSIS — R0609 Other forms of dyspnea: Secondary | ICD-10-CM | POA: Diagnosis not present

## 2023-03-22 DIAGNOSIS — I2510 Atherosclerotic heart disease of native coronary artery without angina pectoris: Secondary | ICD-10-CM | POA: Diagnosis not present

## 2023-03-22 DIAGNOSIS — E7800 Pure hypercholesterolemia, unspecified: Secondary | ICD-10-CM

## 2023-03-22 MED ORDER — DILTIAZEM HCL 30 MG PO TABS: 15.0000 mg | ORAL_TABLET | ORAL | 3 refills | Status: DC | PRN

## 2023-03-22 NOTE — Patient Instructions (Signed)
Medication Instructions:  Your physician has recommended you make the following change in your medication:   Start diltiazem 15 mg (1/2 tablet) 2 times daily as needed for palpitations.  *If you need a refill on your cardiac medications before your next appointment, please call your pharmacy*   Lab Work: Lipids in 3 mths If you have labs (blood work) drawn today and your tests are completely normal, you will receive your results only by: MyChart Message (if you have MyChart) OR A paper copy in the mail If you have any lab test that is abnormal or we need to change your treatment, we will call you to review the results.  Follow-Up: At [facility], you and your health needs are our priority.  As part of our continuing mission to provide you with exceptional heart care, we have created designated Provider Care Teams.  These Care Teams include your primary Cardiologist (physician) and Advanced Practice Providers (APPs -  Physician Assistants and Nurse Practitioners) who all work together to provide you with the care you need, when you need it.   Your next appointment:   6 month(s)  Provider:   Dr Donell Sievert

## 2023-03-25 ENCOUNTER — Telehealth: Payer: Self-pay

## 2023-03-25 MED ORDER — DILTIAZEM HCL 30 MG PO TABS: 30.0000 mg | ORAL_TABLET | Freq: Two times a day (BID) | ORAL | 6 refills | Status: DC | PRN

## 2023-03-25 NOTE — Telephone Encounter (Signed)
Aimee Davis, Aimee Davis - 03/25/2023  1:30 PM [name], MD  Sent: Mon March 25, 2023  2:23 PM  To: Loa Socks, LPN         Message  30mg  will be fine! No problem    Per Dr. Shari Prows, she is to take diltiazem 30 mg po bid as needed for palpitations.   Sent this new script into the pts mail order [facility] to fill.

## 2023-03-25 NOTE — Telephone Encounter (Signed)
Called [facility] and spoke with the Pharmacist about new script for diltiazem 30 mg po bid PRN for palpitations that we sent into them to fill.  Advised them to fill this script and disregard incorrect and previous script that was sent to them on 7/5.   Pharmacist said she can see the new script we sent in today and will fill this accordingly for the pt.   Pharmacist with [facility] verbalized understanding and agrees with this plan.

## 2023-03-25 NOTE — Telephone Encounter (Signed)
[facility] mail order pharmacy is stating that per manufactuer, Diltiazem tablet 30 mg cannot be split as this may alter the rate of drug release. Is Dr. Shari Prows aware of this and would Dr. Shari Prows like to refill the medication as prescribed? Please address    Ph# [phone]   Order# [id number]

## 2023-03-27 ENCOUNTER — Encounter: Payer: 59 | Admitting: Student

## 2023-05-07 ENCOUNTER — Encounter: Payer: 59 | Admitting: Student

## 2023-05-23 ENCOUNTER — Ambulatory Visit: Payer: 59 | Admitting: Gastroenterology

## 2023-05-28 ENCOUNTER — Other Ambulatory Visit: Payer: Self-pay

## 2023-05-28 ENCOUNTER — Ambulatory Visit: Payer: 59 | Admitting: Internal Medicine

## 2023-05-28 ENCOUNTER — Encounter: Payer: Self-pay | Admitting: Internal Medicine

## 2023-05-28 VITALS — BP 119/67 | HR 74 | Temp 97.5°F | Ht 63.0 in | Wt 152.0 lb

## 2023-05-28 DIAGNOSIS — M549 Dorsalgia, unspecified: Secondary | ICD-10-CM

## 2023-05-28 DIAGNOSIS — M545 Low back pain, unspecified: Secondary | ICD-10-CM | POA: Diagnosis not present

## 2023-05-28 MED ORDER — BACLOFEN 10 MG PO TABS: 10.0000 mg | ORAL_TABLET | Freq: Every day | ORAL | 0 refills | Status: AC

## 2023-05-28 MED ORDER — DICLOFENAC SODIUM 1 % EX GEL
4.0000 g | Freq: Every day | CUTANEOUS | Status: DC | PRN
Start: 2023-05-28 — End: 2024-03-05

## 2023-05-28 NOTE — Assessment & Plan Note (Addendum)
Patient presents with right-sided lower back pain for approximately one month. She reports the pain starting after doing water aerobics. The pain starts in her right lower back and hip and radiates down the lateral aspect of her right leg. It is exacerbated by standing up and sitting down. Straight leg test positive on right side, leg can only be elevated to approximately 45 degrees. She is normally very active, but has been limited in her movements for the past several weeks. She has tried several days of Advil, but she prefers not to take medications as it irritates her IBS. Other topical remedies have been ineffective. Denies red flag symptoms such as bowel or bladder incontinence, numbness. No IV drug use. Most likely etiology is musculoskeletal strain, possibly sciatica-type pain.  -3 days of Baclofen and voltaren gel prescribed. Recommend NSAIDs as she can tolerate -stretches for low back/hip pain included in AVS -patient instructed to return in 4 weeks if pain persists

## 2023-05-28 NOTE — Patient Instructions (Addendum)
Ms. Comp,   It was a pleasure meeting you today. To summarize our visit:  I am prescribing you three days of a muscle relaxer and a voltaren gel that you can apply to your back and hip. I have also included some stretches. If you are continuing to have pain in a month, please schedule another visit with our clinic.   Also, here is the message from the pulmonology office:   Dear Aimee Davis,    Our office, [facility], is reaching out regarding a referral our office received from [facility]. If you wish to schedule an appointment, please contact our office at your earliest convenience.   Have a great day.   Thank you,  [facility]  [address] [place] [address] [phone]  Thank you,  Monna Fam MD

## 2023-05-28 NOTE — Progress Notes (Deleted)
CC: ***  HPI:  Aimee Davis is a 67 y.o. female with a past medical history of CAD, emphysema, COPD, GERD, depression presenting for back and hip pain.  Medications: GERD: Protonix 40 mg twice daily Allergic rhinitis: Mometasone COPD: Trelegy Hyperlipidemia: Zetia 10 mg daily CAD: Aspirin 81 mg daily Vitamin D deficiency: Vitamin D 1000 units daily Palpitations: Diltiazem 15 mg twice daily as needed   Most recently seen her cardiologist and at that time was started on diltiazem.  Patient also was referred to pulmonology.  Patient had reassuring cardiac workup.  Had palpitations.  For hyperlipidemia patient unable to tolerate statins.  Plan for repeat lipid panel in October  Labs: April 2024 CMP sodium 135, potassium 4.4, creatinine 0.90 CBC: White count 3.4, hemoglobin 13.0, platelets 273  12/24/2022 vitamin D 27  Evaluate back pain today  Imaging in September 2014 of lumbar spine showing moderate facet arthritis at L3-4 with grade 1 spondylolisthesis and slight facet arthritis of L4-L5 Past Medical History:  Diagnosis Date   Abdominal pain 08/16/2008   Qualifier: Diagnosis of  By: Daphine Deutscher FNP, [name]     Abdominal pain, epigastric 09/08/2019   Allergy    seasonal, PCN, antidepressants, bentyl   Aortic atherosclerosis (HCC) 02/23/2021   Arthritis    Asthma    Asymptomatic bacteriuria 08/24/2021   UA showed leukocytes but patient does not have dysuria, fevers or hematuria. She also had bacteruria in her last UA. Her symptoms of fatigue, and loss of appetite are non-specific but if persist or she begins having dysuria we can work up further. A UTI would not explain the abdominal pain patient is having.    Carpal tunnel syndrome on both sides    right worse than left 2008   Cataract 06/11/2013   COPD (chronic obstructive pulmonary disease) (HCC)    COVID-19 virus infection 06/18/2016   Eczema 05/02/2018   Food poisoning 04/04/2021   GERD (gastroesophageal reflux  disease)    Hair loss 07/07/2019   Hyperlipidemia    LDL 108 JULY 2013   IBS (irritable bowel syndrome)    Macular rash 05/20/2019   Multiple nevi 03/01/2015   Pneumonia    Positive TB test    From [facility] 2009 note : Recent F/u at [facility]. CXRAy negative.  Positive TST in 2007 (14mm).  Unable to tolerate INH due to hepatotoxicity   Primary insomnia 06/05/2012   Skin macule 04/21/2019   Tinnitus 07/07/2019   URI (upper respiratory infection) 11/01/2010   Qualifier: Diagnosis of  By: Daphine Deutscher FNP, Zena Amos       Current Outpatient Medications:    albuterol (VENTOLIN HFA) 108 (90 Base) MCG/ACT inhaler, USE 2 INHALATIONS BY MOUTH EVERY 6 HOURS AS NEEDED FOR WHEEZING  OR SHORTNESS OF BREATH (Patient taking differently: Inhale 2 puffs into the lungs every 6 (six) hours as needed for wheezing.), Disp: 26.8 g, Rfl: 2   Artificial Tear Ointment (DRY EYES OP), Apply 1 drop to eye daily as needed (dry eyes)., Disp: , Rfl:    Ascorbic Acid (VITAMIN C) 100 MG tablet, Take 50 mg by mouth daily., Disp: , Rfl:    aspirin EC 81 MG tablet, Take 1 tablet (81 mg total) by mouth daily. Swallow whole., Disp: 90 tablet, Rfl: 3   carboxymethylcellul-glycerin (OPTIVE) 0.5-0.9 % ophthalmic solution, 1 drop 4 (four) times daily as needed for dry eyes., Disp: , Rfl:    cholecalciferol (VITAMIN D3) 25 MCG (1000 UNIT) tablet, Take 1 tablet (1,000 Units total) by  mouth daily., Disp: 60 tablet, Rfl: 1   diltiazem (CARDIZEM) 30 MG tablet, Take 1 tablet (30 mg total) by mouth 2 (two) times daily as needed (For palpitations)., Disp: 30 tablet, Rfl: 6   ezetimibe (ZETIA) 10 MG tablet, Take 1 tablet (10 mg total) by mouth daily., Disp: 90 tablet, Rfl: 3   fexofenadine (ALLEGRA) 180 MG tablet, Take 1 tablet (180 mg total) by mouth daily., Disp: 90 tablet, Rfl: 1   Fluticasone-Umeclidin-Vilant (TRELEGY ELLIPTA) 200-62.5-25 MCG/ACT AEPB, USE 1 INHALATION BY MOUTH ONCE  DAILY AT THE SAME TIME EACH DAY, Disp: 180 each, Rfl: 3   mometasone  (NASONEX) 50 MCG/ACT nasal spray, PLACE 2 SPRAYS IN EACH NOSTRIL ONCE DAILY AS NEEDED. (Patient taking differently: Place 2 sprays into the nose daily.), Disp: 17 g, Rfl: 6   Multiple Vitamins-Minerals (MULTIVITAMIN WITH MINERALS) tablet, Take 1 tablet by mouth daily., Disp: , Rfl:    pantoprazole (PROTONIX) 40 MG tablet, Take 1 tablet (40 mg total) by mouth 2 (two) times daily before a meal for 15 days., Disp: 30 tablet, Rfl: 0  Review of Systems:  ***  Constitutional: Eye: Respiratory: Cardiovascular: GI: MSK: GU: Skin: Neuro: Endocrine:   Physical Exam:  There were no vitals filed for this visit. *** General: Patient is sitting comfortably in the room  Eyes: Pupils equal and reactive to light, EOM intact  Head: Normocephalic, atraumatic  Neck: Supple, nontender, full range of motion, No JVD Cardio: Regular rate and rhythm, no murmurs, rubs or gallops. 2+ pulses to bilateral upper and lower extremities  Chest: No chest tenderness Pulmonary: Clear to ausculation bilaterally with no rales, rhonchi, and crackles  Abdomen: Soft, nontender with normoactive bowel sounds with no rebound or guarding  Neuro: Alert and orientated x3. CN II-XII intact. Sensation intact to upper and lower extremities. 2+ patellar reflex.  Back: No midline tenderness, no step off or deformities noted. No paraspinal muscle tenderness.  Skin: No rashes noted  MSK: 5/5 strength to upper and lower extremities.    Assessment & Plan:   No problem-specific Assessment & Plan notes found for this encounter.    Patient {GC/GE:3044014::"discussed with","seen with"} Dr. {NAMES:[id number]::"Guilloud","Hoffman","Mullen","Narendra","Williams","Vincent"}  Modena Slater, DO PGY-2 Internal Medicine Resident  Pager: [phone]

## 2023-05-28 NOTE — Progress Notes (Signed)
Subjective:  CC: right sided back/hip pain  HPI:  [name] is a 67 y.o. female with a past medical history stated below and presents today for above. Please see problem based assessment and plan for additional details.  Past Medical History:  Diagnosis Date   Abdominal pain 08/16/2008   Qualifier: Diagnosis of  By: Daphine Deutscher FNP, [name]     Abdominal pain, epigastric 09/08/2019   Allergy    seasonal, PCN, antidepressants, bentyl   Aortic atherosclerosis (HCC) 02/23/2021   Arthritis    Asthma    Asymptomatic bacteriuria 08/24/2021   UA showed leukocytes but patient does not have dysuria, fevers or hematuria. She also had bacteruria in her last UA. Her symptoms of fatigue, and loss of appetite are non-specific but if persist or she begins having dysuria we can work up further. A UTI would not explain the abdominal pain patient is having.    Carpal tunnel syndrome on both sides    right worse than left 2008   Cataract 06/11/2013   COPD (chronic obstructive pulmonary disease) (HCC)    COVID-19 virus infection 06/18/2016   Eczema 05/02/2018   Food poisoning 04/04/2021   GERD (gastroesophageal reflux disease)    Hair loss 07/07/2019   Hyperlipidemia    LDL 108 JULY 2013   IBS (irritable bowel syndrome)    Macular rash 05/20/2019   Multiple nevi 03/01/2015   Pneumonia    Positive TB test    From [facility] 2009 note : Recent F/u at [facility]. CXRAy negative.  Positive TST in 2007 (14mm).  Unable to tolerate INH due to hepatotoxicity   Primary insomnia 06/05/2012   Skin macule 04/21/2019   Tinnitus 07/07/2019   URI (upper respiratory infection) 11/01/2010   Qualifier: Diagnosis of  By: Daphine Deutscher FNP, Zena Amos      Current Outpatient Medications on File Prior to Visit  Medication Sig Dispense Refill   albuterol (VENTOLIN HFA) 108 (90 Base) MCG/ACT inhaler USE 2 INHALATIONS BY MOUTH EVERY 6 HOURS AS NEEDED FOR WHEEZING  OR SHORTNESS OF BREATH (Patient taking differently: Inhale 2 puffs into  the lungs every 6 (six) hours as needed for wheezing.) 26.8 g 2   Artificial Tear Ointment (DRY EYES OP) Apply 1 drop to eye daily as needed (dry eyes).     Ascorbic Acid (VITAMIN C) 100 MG tablet Take 50 mg by mouth daily.     aspirin EC 81 MG tablet Take 1 tablet (81 mg total) by mouth daily. Swallow whole. 90 tablet 3   carboxymethylcellul-glycerin (OPTIVE) 0.5-0.9 % ophthalmic solution 1 drop 4 (four) times daily as needed for dry eyes.     cholecalciferol (VITAMIN D3) 25 MCG (1000 UNIT) tablet Take 1 tablet (1,000 Units total) by mouth daily. 60 tablet 1   diltiazem (CARDIZEM) 30 MG tablet Take 1 tablet (30 mg total) by mouth 2 (two) times daily as needed (For palpitations). 30 tablet 6   ezetimibe (ZETIA) 10 MG tablet Take 1 tablet (10 mg total) by mouth daily. 90 tablet 3   fexofenadine (ALLEGRA) 180 MG tablet Take 1 tablet (180 mg total) by mouth daily. 90 tablet 1   Fluticasone-Umeclidin-Vilant (TRELEGY ELLIPTA) 200-62.5-25 MCG/ACT AEPB USE 1 INHALATION BY MOUTH ONCE  DAILY AT THE SAME TIME EACH DAY 180 each 3   mometasone (NASONEX) 50 MCG/ACT nasal spray PLACE 2 SPRAYS IN EACH NOSTRIL ONCE DAILY AS NEEDED. (Patient taking differently: Place 2 sprays into the nose daily.) 17 g 6   Multiple Vitamins-Minerals (  MULTIVITAMIN WITH MINERALS) tablet Take 1 tablet by mouth daily.     pantoprazole (PROTONIX) 40 MG tablet Take 1 tablet (40 mg total) by mouth 2 (two) times daily before a meal for 15 days. 30 tablet 0   No current facility-administered medications on file prior to visit.    Review of Systems: ROS negative except for as is noted on the assessment and plan.  Objective:   Vitals:   05/28/23 1100  BP: 119/67  Pulse: 74  Temp: (!) 97.5 F (36.4 C)  TempSrc: Oral  SpO2: 100%  Weight: 152 lb (68.9 kg)  Height: 5\' 3"  (1.6 m)    Physical Exam: Constitutional: well-appearing, in no acute distress HENT: normocephalic atraumatic, mucous membranes moist Eyes: conjunctiva  non-erythematous Neck: supple Cardiovascular: regular rate and rhythm, no m/r/g Pulmonary/Chest: normal work of breathing on room air, lungs clear to auscultation bilaterally Abdominal: soft, non-tender, non-distended MSK: straight leg raise test positive. Right leg can only be elevated to 45 degrees Neurological: alert & oriented x 3, 5/5 strength in bilateral upper and lower extremities, normal gait Skin: warm and dry  Assessment & Plan:   Low back pain Patient presents with right-sided lower back pain for approximately one month. She reports the pain starting after doing water aerobics. The pain starts in her right lower back and hip and radiates down the lateral aspect of her right leg. It is exacerbated by standing up and sitting down. Straight leg test positive on right side, leg can only be elevated to approximately 45 degrees. She is normally very active, but has been limited in her movements for the past several weeks. She has tried several days of Advil, but she prefers not to take medications as it irritates her IBS. Other topical remedies have been ineffective. Denies red flag symptoms such as bowel or bladder incontinence, numbness. No IV drug use. Most likely etiology is musculoskeletal strain, possibly sciatica-type pain.  -3 days of Baclofen and voltaren gel prescribed. Recommend NSAIDs as she can tolerate -stretches for low back/hip pain included in AVS -patient instructed to return in 4 weeks if pain persists   Patient seen with Dr. Bari Edward MD [facility]  PGY-1 Pager: [phone] Date 05/28/2023  Time 11:49 AM

## 2023-06-11 NOTE — Addendum Note (Signed)
Addended by: Debe Coder B on: 06/11/2023 02:34 PM   Modules accepted: Level of Service

## 2023-06-11 NOTE — Progress Notes (Signed)
Internal Medicine Clinic Attending  I was physically present during the key portions of the resident provided service and participated in the medical decision making of patient's management care. I reviewed pertinent patient test results.  The assessment, diagnosis, and plan were formulated together and I agree with the documentation in the resident's note.  Please note in Dr. Mosie Epstein HPI, that the patient is being seen for acute low back pain.   Inez Catalina, MD

## 2023-06-24 ENCOUNTER — Ambulatory Visit: Payer: 59 | Attending: Cardiology

## 2023-06-24 DIAGNOSIS — E7800 Pure hypercholesterolemia, unspecified: Secondary | ICD-10-CM

## 2023-06-24 DIAGNOSIS — R0609 Other forms of dyspnea: Secondary | ICD-10-CM

## 2023-06-24 DIAGNOSIS — E785 Hyperlipidemia, unspecified: Secondary | ICD-10-CM

## 2023-06-24 DIAGNOSIS — R002 Palpitations: Secondary | ICD-10-CM

## 2023-06-24 DIAGNOSIS — I2510 Atherosclerotic heart disease of native coronary artery without angina pectoris: Secondary | ICD-10-CM

## 2023-06-25 LAB — LIPID PANEL
Chol/HDL Ratio: 1.9 {ratio} (ref 0.0–4.4)
Cholesterol, Total: 241 mg/dL — ABNORMAL HIGH (ref 100–199)
HDL: 129 mg/dL (ref >39)
LDL Chol Calc (NIH): 105 mg/dL — ABNORMAL HIGH (ref 0–99)
Triglycerides: 43 mg/dL (ref 0–149)
VLDL Cholesterol Cal: 7 mg/dL (ref 5–40)

## 2023-07-05 ENCOUNTER — Telehealth: Payer: Self-pay | Admitting: Internal Medicine

## 2023-07-05 DIAGNOSIS — I2510 Atherosclerotic heart disease of native coronary artery without angina pectoris: Secondary | ICD-10-CM

## 2023-07-05 DIAGNOSIS — E785 Hyperlipidemia, unspecified: Secondary | ICD-10-CM

## 2023-07-05 NOTE — Telephone Encounter (Signed)
-----   Message from Christell Constant sent at 07/02/2023  8:46 AM EDT ----- Results: LDL is above goal Plan: Re-offer Lipid clinic for Declined [id number], consieration of inclisiran or bempedoic acid therapy  Christell Constant, MD

## 2023-07-05 NOTE — Telephone Encounter (Signed)
The patient has been notified of the result and verbalized understanding.  All questions (if any) were answered. Arvid Right [place] Mullett, RN 07/05/2023 11:06 AM  Scheduled pharm D OV for 07/16/23 at 1:30 pm.

## 2023-07-05 NOTE — Telephone Encounter (Signed)
Patient is returning call in regards to results. Requesting call back. 

## 2023-07-08 ENCOUNTER — Telehealth: Payer: Self-pay | Admitting: *Deleted

## 2023-07-08 NOTE — Telephone Encounter (Signed)
Received a call from who stated she ate pizza on Thursday which was a little spicy. Also went out to [employer] homecoming. She started having diarrhea yesterday;today, feeling weak,no energy, intermittent h/a's. She went out today, bought Gatorade, Imodium, broth, rice and bananas.She's wondering if it's her IBS or virus? No fever. She also bought covid test - stated she will do today. I told her to take Imodium, eat small meals/bland foods and stay hydrated. And let us know if covid test comes back +. And I will send her concerns to the doctor.

## 2023-07-13 ENCOUNTER — Other Ambulatory Visit: Payer: Self-pay

## 2023-07-13 ENCOUNTER — Emergency Department (HOSPITAL_COMMUNITY)
Admission: EM | Admit: 2023-07-13 | Discharge: 2023-07-13 | Disposition: A | Discharge status: Home / Self Care | Payer: 59 | Attending: Student | Admitting: Student

## 2023-07-13 ENCOUNTER — Encounter (HOSPITAL_COMMUNITY): Payer: Self-pay

## 2023-07-13 ENCOUNTER — Telehealth: Payer: Self-pay | Admitting: Gastroenterology

## 2023-07-13 DIAGNOSIS — J449 Chronic obstructive pulmonary disease, unspecified: Secondary | ICD-10-CM | POA: Diagnosis not present

## 2023-07-13 DIAGNOSIS — R109 Unspecified abdominal pain: Secondary | ICD-10-CM | POA: Insufficient documentation

## 2023-07-13 DIAGNOSIS — N3000 Acute cystitis without hematuria: Secondary | ICD-10-CM

## 2023-07-13 DIAGNOSIS — J45909 Unspecified asthma, uncomplicated: Secondary | ICD-10-CM | POA: Insufficient documentation

## 2023-07-13 LAB — CBC
HCT: 38.6 % (ref 36.0–46.0)
Hemoglobin: 12.6 g/dL (ref 12.0–15.0)
MCH: 28.4 pg (ref 26.0–34.0)
MCHC: 32.6 g/dL (ref 30.0–36.0)
MCV: 86.9 fL (ref 80.0–100.0)
Platelets: 214 10*3/uL (ref 150–400)
RBC: 4.44 MIL/uL (ref 3.87–5.11)
RDW: 12 % (ref 11.5–15.5)
WBC: 3.1 10*3/uL — ABNORMAL LOW (ref 4.0–10.5)
nRBC: 0 % (ref 0.0–0.2)

## 2023-07-13 LAB — URINALYSIS, ROUTINE W REFLEX MICROSCOPIC
Bilirubin Urine: NEGATIVE
Glucose, UA: NEGATIVE mg/dL
Ketones, ur: NEGATIVE mg/dL
Nitrite: NEGATIVE
Protein, ur: NEGATIVE mg/dL
Specific Gravity, Urine: 1.002 — ABNORMAL LOW (ref 1.005–1.030)
pH: 6 (ref 5.0–8.0)

## 2023-07-13 LAB — COMPREHENSIVE METABOLIC PANEL
ALT: 34 U/L (ref 0–44)
AST: 42 U/L — ABNORMAL HIGH (ref 15–41)
Albumin: 4 g/dL (ref 3.5–5.0)
Alkaline Phosphatase: 56 U/L (ref 38–126)
Anion gap: 8 (ref 5–15)
BUN: 9 mg/dL (ref 8–23)
CO2: 21 mmol/L — ABNORMAL LOW (ref 22–32)
Calcium: 9.2 mg/dL (ref 8.9–10.3)
Chloride: 102 mmol/L (ref 98–111)
Creatinine, Ser: 0.74 mg/dL (ref 0.44–1.00)
GFR, Estimated: >60 mL/min (ref >60)
Glucose, Bld: 111 mg/dL — ABNORMAL HIGH (ref 70–99)
Potassium: 3.7 mmol/L (ref 3.5–5.1)
Sodium: 131 mmol/L — ABNORMAL LOW (ref 135–145)
Total Bilirubin: 0.8 mg/dL (ref 0.3–1.2)
Total Protein: 6.3 g/dL — ABNORMAL LOW (ref 6.5–8.1)

## 2023-07-13 LAB — LIPASE, BLOOD: Lipase: 24 U/L (ref 11–51)

## 2023-07-13 MED ORDER — CEFADROXIL 500 MG PO CAPS
500.0000 mg | ORAL_CAPSULE | Freq: Two times a day (BID) | ORAL | Status: DC
  Administered 2023-07-13: 500 mg via ORAL
  Filled 2023-07-13: qty 1

## 2023-07-13 MED ORDER — SODIUM CHLORIDE 0.9 % IV SOLN: 2.0000 g | Freq: Once | INTRAVENOUS | Status: DC

## 2023-07-13 MED ORDER — CEFADROXIL 500 MG PO CAPS: 500.0000 mg | ORAL_CAPSULE | Freq: Two times a day (BID) | ORAL | 0 refills | Status: AC

## 2023-07-13 NOTE — Telephone Encounter (Signed)
Patient called after hours for advice - she states she has "no energy", feels fatigued, dry mouth and dehydrated. Having chills. She has baseline IBS, states she has some cramping in her abdomen.   Sounds like she may have a viral or some type of infection, main symptom that bothers her is fatigue and very nonspecific. Told her at this hour on a Saturday night if she feels that bad her only option is the ED. Told her to push fluids, can try some tylenol. She states something feels "off" and she thinks she needs to be evaluated, she will call one of her friends to take her to the ED. I otherwise recommend she call her PCP's office for advice or try to see them on Monday if she is otherwise stable, I don't think her current symptoms are from IBS. She agreed and I answered her questions,

## 2023-07-13 NOTE — ED Provider Notes (Signed)
[place] EMERGENCY DEPARTMENT AT [facility] Provider Note  CSN: [id number] Arrival date & time: 07/13/23 1918  Chief Complaint(s) Weakness  HPI Aimee Davis is a 67 y.o. female who presents emergency room for evaluation of generalized weakness and lower crampy abdominal pain.  Patient states that over the last 3 to 4 days she has been feeling generalized fatigue, myalgias, decreased appetite and crampy abdominal pain.  She does have a history of IBS and states that this feels little different.  Feels that she may be dehydrated but is denying any nausea, vomiting, chest pain, shortness of breath, headache, fever or other systemic symptoms.   Past Medical History Past Medical History:  Diagnosis Date   Abdominal pain 08/16/2008   Qualifier: Diagnosis of  By: Daphine Deutscher FNP, [name]     Abdominal pain, epigastric 09/08/2019   Allergy    seasonal, PCN, antidepressants, bentyl   Aortic atherosclerosis (HCC) 02/23/2021   Arthritis    Asthma    Asymptomatic bacteriuria 08/24/2021   UA showed leukocytes but patient does not have dysuria, fevers or hematuria. She also had bacteruria in her last UA. Her symptoms of fatigue, and loss of appetite are non-specific but if persist or she begins having dysuria we can work up further. A UTI would not explain the abdominal pain patient is having.    Carpal tunnel syndrome on both sides    right worse than left 2008   Cataract 06/11/2013   COPD (chronic obstructive pulmonary disease) (HCC)    COVID-19 virus infection 06/18/2016   Eczema 05/02/2018   Food poisoning 04/04/2021   GERD (gastroesophageal reflux disease)    Hair loss 07/07/2019   Hyperlipidemia    LDL 108 JULY 2013   IBS (irritable bowel syndrome)    Macular rash 05/20/2019   Multiple nevi 03/01/2015   Pneumonia    Positive TB test    From [facility] 2009 note : Recent F/u at [facility]. CXRAy negative.  Positive TST in 2007 (14mm).  Unable to tolerate INH due to hepatotoxicity   Primary  insomnia 06/05/2012   Skin macule 04/21/2019   Tinnitus 07/07/2019   URI (upper respiratory infection) 11/01/2010   Qualifier: Diagnosis of  By: Daphine Deutscher FNP, Zena Amos     Patient Active Problem List   Diagnosis Date Noted   Palpitations 01/10/2023   Hyperlipidemia 01/08/2023   Statin myopathy 12/24/2022   Altered bowel habits 10/05/2022   Overactive bladder 03/21/2022   Pancreatic mass 03/21/2022   Emphysema lung (HCC) 12/19/2021   Pseudodementia 12/19/2021   Abdominal pain 08/24/2021   Pure hypercholesterolemia 02/23/2021   Coronary artery disease involving native coronary artery of native heart without angina pectoris 02/23/2021   Memory loss of unknown cause 12/08/2019   MDD (major depressive disorder) 12/05/2019   Restless leg syndrome 11/16/2019   Seborrheic keratosis 04/21/2019   History of colon polyps 06/24/2018   Constitutional neutropenia (HCC) 02/21/2018   Low back pain 03/29/2017   Seasonal allergies 03/29/2017   Vitamin D deficiency 01/04/2016   Chronic fatigue 07/16/2013   Healthcare maintenance 06/11/2013   Upper respiratory infection, viral 11/01/2010   COPD (chronic obstructive pulmonary disease) (HCC) 03/17/2010   RHINITIS, ALLERGIC 11/14/2006   GERD (gastroesophageal reflux disease) 11/14/2006   Irritable bowel syndrome 11/14/2006   Home Medication(s) Prior to Admission medications   Medication Sig Start Date End Date Taking? Authorizing Provider  albuterol (VENTOLIN HFA) 108 (90 Base) MCG/ACT inhaler USE 2 INHALATIONS BY MOUTH EVERY 6 HOURS AS NEEDED FOR WHEEZING  OR SHORTNESS OF BREATH Patient taking differently: Inhale 2 puffs into the lungs every 6 (six) hours as needed for wheezing. 12/20/22   Steffanie Rainwater, MD  Artificial Tear Ointment (DRY EYES OP) Apply 1 drop to eye daily as needed (dry eyes).    [provider]  Ascorbic Acid (VITAMIN C) 100 MG tablet Take 50 mg by mouth daily.    [provider]  aspirin EC 81 MG tablet Take 1  tablet (81 mg total) by mouth daily. Swallow whole. 11/23/22   Orbie Pyo, MD  carboxymethylcellul-glycerin (OPTIVE) 0.5-0.9 % ophthalmic solution 1 drop 4 (four) times daily as needed for dry eyes.    [provider]  cholecalciferol (VITAMIN D3) 25 MCG (1000 UNIT) tablet Take 1 tablet (1,000 Units total) by mouth daily. 12/25/22   Steffanie Rainwater, MD  diltiazem (CARDIZEM) 30 MG tablet Take 1 tablet (30 mg total) by mouth 2 (two) times daily as needed (For palpitations). 03/25/23   [name], MD  ezetimibe (ZETIA) 10 MG tablet Take 1 tablet (10 mg total) by mouth daily. 01/11/23   [name], MD  fexofenadine (ALLEGRA) 180 MG tablet Take 1 tablet (180 mg total) by mouth daily. 12/24/22   Steffanie Rainwater, MD  Fluticasone-Umeclidin-Vilant (TRELEGY ELLIPTA) 200-62.5-25 MCG/ACT AEPB USE 1 INHALATION BY MOUTH ONCE  DAILY AT THE SAME TIME EACH DAY 01/22/23   Steffanie Rainwater, MD  mometasone (NASONEX) 50 MCG/ACT nasal spray PLACE 2 SPRAYS IN EACH NOSTRIL ONCE DAILY AS NEEDED. Patient taking differently: Place 2 sprays into the nose daily. 06/15/21   Dolan Amen, MD  Multiple Vitamins-Minerals (MULTIVITAMIN WITH MINERALS) tablet Take 1 tablet by mouth daily.    [provider]  pantoprazole (PROTONIX) 40 MG tablet Take 1 tablet (40 mg total) by mouth 2 (two) times daily before a meal for 15 days. 01/10/23 01/25/23  Willeen Niece, MD                                                                                                                                    Past Surgical History Past Surgical History:  Procedure Laterality Date   BREAST CYST EXCISION Right 2011   BREAST EXCISIONAL BIOPSY     benign   BREAST EXCISIONAL BIOPSY Right 1990   benign   INDUCED ABORTION  2005   VAGINAL HYSTERECTOMY  1983   partial; for abnormal bleeding   Family History Family History  Problem Relation Age of Onset   Hypertension Mother    Heart disease Mother     Diabetes Mother    Arthritis Mother    Stroke Father    Diabetes Father    Heart disease Father    Hyperlipidemia Father    Hypertension Father    Kidney disease Father    Hypertension Sister    Breast cancer Sister    Breast cancer Sister    Hypertension  Sister    Arthritis Sister    Hypertension Sister    Diabetes Sister    Mental illness Brother    Aneurysm Brother        brain   Arthritis Brother    Hypertension Maternal Aunt    Hypertension Maternal Uncle    Stomach cancer Maternal Uncle    Hypertension Paternal Aunt    Kidney disease Paternal Aunt        x 2   Hypertension Paternal Uncle    Kidney disease Paternal Uncle        x 2   Colon cancer Neg Hx    Esophageal cancer Neg Hx    Pancreatic cancer Neg Hx     Social History Social History   Tobacco Use   Smoking status: Former    Current packs/day: 0.00    Average packs/day: 0.3 packs/day for 43.0 years (14.2 ttl pk-yrs)    Types: Cigarettes    Start date: 01/06/1970    Quit date: 01/06/2013    Years since quitting: 10.5   Smokeless tobacco: Never  Vaping Use   Vaping status: Never Used  Substance Use Topics   Alcohol use: No    Alcohol/week: 0.0 standard drinks of alcohol   Drug use: Not Currently    Comment: x 26 yrs.   Allergies Dicyclomine, Hydrocodone, Lipitor [atorvastatin], Penicillins, and Levsin [hyoscyamine]  Review of Systems Review of Systems  Constitutional:  Positive for fatigue.  Musculoskeletal:  Positive for myalgias.    Physical Exam Vital Signs  I have reviewed the triage vital signs BP 138/80 (BP Location: Left Arm)   Pulse 68   Temp 97.9 F (36.6 C) (Oral)   Resp 18   Ht 5\' 3"  (1.6 m)   Wt 68 kg   SpO2 98%   BMI 26.57 kg/m   Physical Exam Vitals and nursing note reviewed.  Constitutional:      General: She is not in acute distress.    Appearance: She is well-developed.  HENT:     Head: Normocephalic and atraumatic.  Eyes:     Conjunctiva/sclera:  Conjunctivae normal.  Cardiovascular:     Rate and Rhythm: Normal rate and regular rhythm.     Heart sounds: No murmur heard. Pulmonary:     Effort: Pulmonary effort is normal. No respiratory distress.     Breath sounds: Normal breath sounds.  Abdominal:     Palpations: Abdomen is soft.     Tenderness: There is no abdominal tenderness.  Musculoskeletal:        General: No swelling.     Cervical back: Neck supple.  Skin:    General: Skin is warm and dry.     Capillary Refill: Capillary refill takes less than 2 seconds.  Neurological:     Mental Status: She is alert.  Psychiatric:        Mood and Affect: Mood normal.     ED Results and Treatments Labs (all labs ordered are listed, but only abnormal results are displayed) Labs Reviewed  COMPREHENSIVE METABOLIC PANEL - Abnormal; Notable for the following components:      Result Value   Sodium 131 (*)    CO2 21 (*)    Glucose, Bld 111 (*)    Total Protein 6.3 (*)    AST 42 (*)    All other components within normal limits  CBC - Abnormal; Notable for the following components:   WBC 3.1 (*)    All other components within normal  limits  URINALYSIS, ROUTINE W REFLEX MICROSCOPIC - Abnormal; Notable for the following components:   Color, Urine STRAW (*)    Specific Gravity, Urine 1.002 (*)    Hgb urine dipstick SMALL (*)    Leukocytes,Ua LARGE (*)    Bacteria, UA RARE (*)    All other components within normal limits  URINE CULTURE  LIPASE, BLOOD                                                                                                                          Radiology No results found.  Pertinent labs & imaging results that were available during my care of the patient were reviewed by me and considered in my medical decision making (see MDM for details).  Medications Ordered in ED Medications  cefadroxil (DURICEF) capsule 500 mg (has no administration in time range)                                                                                                                                      Procedures Procedures  (including critical care time)  Medical Decision Making / ED Course   This patient presents to the ED for concern of generalized fatigue, myalgias, this involves an extensive number of treatment options, and is a complaint that carries with it a high risk of complications and morbidity.  The differential diagnosis includes viral illness, UTI, anemia, electrolyte abnormality  MDM: Patient seen emergency room for evaluation of myalgias, fatigue and decreased appetite.  Physical exam largely unremarkable.  Laboratory evaluation with a leukopenia to 3.1, hyponatremia 131, AST 42, lipase normal, urinalysis is concerning for infection with large leuk esterase, 21-50 white blood cells and rare bacteria.  Urine culture sent.  Patient started on cefadroxil cultures have shown pansensitive E. Coli.  Given her IBS, she was encouraged to take probiotic yogurt with her antibiotics to maintain her gut microbiome and hydrate aggressively at home.  At this time she does not meet inpatient criteria for admission she is safe for discharge with outpatient follow-up.   Additional history obtained:  -External records from outside source obtained and reviewed including: Chart review including previous notes, labs, imaging, consultation notes   Lab Tests: -I ordered, reviewed, and interpreted labs.   The pertinent results include:   Labs Reviewed  COMPREHENSIVE METABOLIC PANEL - Abnormal; Notable for the following components:  Result Value   Sodium 131 (*)    CO2 21 (*)    Glucose, Bld 111 (*)    Total Protein 6.3 (*)    AST 42 (*)    All other components within normal limits  CBC - Abnormal; Notable for the following components:   WBC 3.1 (*)    All other components within normal limits  URINALYSIS, ROUTINE W REFLEX MICROSCOPIC - Abnormal; Notable for the following components:   Color, Urine  STRAW (*)    Specific Gravity, Urine 1.002 (*)    Hgb urine dipstick SMALL (*)    Leukocytes,Ua LARGE (*)    Bacteria, UA RARE (*)    All other components within normal limits  URINE CULTURE  LIPASE, BLOOD      EKG   EKG Interpretation Date/Time:  Saturday July 13 2023 19:28:34 EDT Ventricular Rate:  63 PR Interval:  148 QRS Duration:  95 QT Interval:  376 QTC Calculation: 385 R Axis:   2  Text Interpretation: Sinus rhythm Right bundle branch block No significant change since last tracing Confirmed by Leverett Camplin ([id number]) on 07/13/2023 11:32:09 PM           Medicines ordered and prescription drug management: Meds ordered this encounter  Medications   DISCONTD: cefTRIAXone (ROCEPHIN) 2 g in sodium chloride 0.9 % 100 mL IVPB    Order Specific Question:   Antibiotic Indication:    Answer:   UTI   cefadroxil (DURICEF) capsule 500 mg    -I have reviewed the patients home medicines and have made adjustments as needed  Critical interventions none   Cardiac Monitoring: The patient was maintained on a cardiac monitor.  I personally viewed and interpreted the cardiac monitored which showed an underlying rhythm of: NSR  Social Determinants of Health:  Factors impacting patients care include: none   Reevaluation: After the interventions noted above, I reevaluated the patient and found that they have :improved  Co morbidities that complicate the patient evaluation  Past Medical History:  Diagnosis Date   Abdominal pain 08/16/2008   Qualifier: Diagnosis of  By: Daphine Deutscher FNP, [name]     Abdominal pain, epigastric 09/08/2019   Allergy    seasonal, PCN, antidepressants, bentyl   Aortic atherosclerosis (HCC) 02/23/2021   Arthritis    Asthma    Asymptomatic bacteriuria 08/24/2021   UA showed leukocytes but patient does not have dysuria, fevers or hematuria. She also had bacteruria in her last UA. Her symptoms of fatigue, and loss of appetite are non-specific but if  persist or she begins having dysuria we can work up further. A UTI would not explain the abdominal pain patient is having.    Carpal tunnel syndrome on both sides    right worse than left 2008   Cataract 06/11/2013   COPD (chronic obstructive pulmonary disease) (HCC)    COVID-19 virus infection 06/18/2016   Eczema 05/02/2018   Food poisoning 04/04/2021   GERD (gastroesophageal reflux disease)    Hair loss 07/07/2019   Hyperlipidemia    LDL 108 JULY 2013   IBS (irritable bowel syndrome)    Macular rash 05/20/2019   Multiple nevi 03/01/2015   Pneumonia    Positive TB test    From M[facility] 2009 note : Recent F/u at [facility]. CXRAy negative.  Positive TST in 2007 (14mm).  Unable to tolerate INH due to hepatotoxicity   Primary insomnia 06/05/2012   Skin macule 04/21/2019   Tinnitus 07/07/2019   URI (upper respiratory infection) 11/01/2010  Qualifier: Diagnosis of  By: Daphine Deutscher FNP, Zena Amos        Dispostion: I considered admission for this patient, but at this time she does not meet inpatient criteria for admission she is safe for discharge with outpatient follow-up     Final Clinical Impression(s) / ED Diagnoses Final diagnoses:  None     @PCDICTATION @    Glendora Score, MD 07/13/23 2332

## 2023-07-13 NOTE — ED Triage Notes (Signed)
Pt arrived POV from home d/t feeling "weak all over and sick to my stomach for a few days.  I feel I may be dehydrated."

## 2023-07-15 LAB — URINE CULTURE: Culture: NO GROWTH

## 2023-07-16 ENCOUNTER — Encounter: Payer: Self-pay | Admitting: Student

## 2023-07-16 ENCOUNTER — Ambulatory Visit: Payer: 59 | Attending: Cardiology | Admitting: Student

## 2023-07-16 DIAGNOSIS — E785 Hyperlipidemia, unspecified: Secondary | ICD-10-CM | POA: Diagnosis not present

## 2023-07-16 MED ORDER — ROSUVASTATIN CALCIUM 20 MG PO TABS: 20.0000 mg | ORAL_TABLET | Freq: Every day | ORAL | 3 refills | Status: DC

## 2023-07-16 NOTE — Assessment & Plan Note (Signed)
Assessment:  LDL goal: < 70 mg/dl last LDLc  474 mg/dl (25/9563) Tolerates Zetia well without any side effects  Intolerance to atorvastatin Discussed benefits of statins especially high intensity statin  Eats healthy balance diet but does not exercise, willing to start 30 min walks 3-4 times per week   Plan: Continue taking ezetimibe 10 mg daily.  Start taking Rosuvastatin 20 mg daily if unable to tolerate reduce doe to 1/2 tab daily. Or every other day Phone f/u due in 4 weeks to assess tolerability   Lipid lab due in 12 weeks

## 2023-07-16 NOTE — Patient Instructions (Signed)
Continue taking ezetimibe 10 mg daily. Start taking Rosuvastatin 20 mg daily if you are not able to tolerate that dose reduce doe to 1/2 tab daily. if unable to tolerate even that dose reduce dose to 1/2 tablet every other day.

## 2023-07-16 NOTE — Progress Notes (Signed)
Patient ID: [name]                 DOB: Jan 20, 1956                    MRN: [id number]      HPI: Aimee Davis is a 67 y.o. female patient referred to lipid clinic by Cr.Chandrasekhar. PMH is significant for CAD, COPD, GERD, statin intolerance, HLD, MDD.   Patient presented today for lipid clinic with her friend. Reports she has only tired atorvastatin in the past she has to stop it due to myalgia and cramps. She is able to tolerate Zetia well without any problem. We discussed statins and its benefits in details. Patient reports she follows healthy low fat diet doe not eat out much avoids fried food. She does not exercise but willing to start walking with her friend   Current Medications: Zetia 10 mg daily  Intolerances: Lipitor - upset stomach, cramps in legs, poor appetite, and sleep disturbance  Risk Factors: CAC score 90,CAD, family hx of ASCVD  LDL goal: <70 Last lipid lab on 06/24/2023 LDLc 105, HDL 129, TG 43, TC 241  Diet: due to IBS she has to watch what she eats  Breakfast: eggs, grits, toast and avocado Lunch : salad with backed chicken or spinach with baked fish  Dinner: baked chicken, baked sweet potatoes  Drink: water, green tea, ginger tea   Snacks: chips, peanut butter with crackers, nuts Exercise:  None   Family History:  Relation Problem Comments  Mother Arthritis   Diabetes   Heart disease   Hypertension     Father (Deceased) Diabetes   Heart disease   Hyperlipidemia   Hypertension   Kidney disease   Stroke     Sister - Robin Breast cancer   Hypertension     Sister - Engineer, maintenance (IT) Breast cancer     Sister - Chief Executive Officer (Alive) Arthritis   Hypertension     Sister - Scientist, clinical (histocompatibility and immunogenetics) (Deceased) Diabetes   Hypertension     Brother - Agricultural consultant Mental illness     Brother - Administrator, sports (Alive) Aneurysm brain    Brother - Armed forces operational officer (Alive) Arthritis     Maternal Aunt Hypertension     Maternal Uncle Hypertension   Stomach cancer     Paternal Aunt Hypertension    Kidney disease x 2    Paternal Uncle Hypertension   Kidney disease x 2      Social History:  Alcohol: none  Smoking: none  Labs: Lipid Panel     Component Value Date/Time   CHOL 241 (H) 06/24/2023 0858   TRIG 43 06/24/2023 0858   HDL 129 06/24/2023 0858   CHOLHDL 1.9 06/24/2023 0858   CHOLHDL 2.2 04/14/2015 1642   VLDL 52 (H) 04/14/2015 1642   LDLCALC 105 (H) 06/24/2023 0858   LABVLDL 7 06/24/2023 0858    Past Medical History:  Diagnosis Date   Abdominal pain 08/16/2008   Qualifier: Diagnosis of  By: Daphine Deutscher FNP, [name]     Abdominal pain, epigastric 09/08/2019   Allergy    seasonal, PCN, antidepressants, bentyl   Aortic atherosclerosis (HCC) 02/23/2021   Arthritis    Asthma    Asymptomatic bacteriuria 08/24/2021   UA showed leukocytes but patient does not have dysuria, fevers or hematuria. She also had bacteruria in her last UA. Her symptoms of fatigue, and loss of appetite are non-specific but if persist or she begins having dysuria we can work up further. A  UTI would not explain the abdominal pain patient is having.    Carpal tunnel syndrome on both sides    right worse than left 2008   Cataract 06/11/2013   COPD (chronic obstructive pulmonary disease) (HCC)    COVID-19 virus infection 06/18/2016   Eczema 05/02/2018   Food poisoning 04/04/2021   GERD (gastroesophageal reflux disease)    Hair loss 07/07/2019   Hyperlipidemia    LDL 108 JULY 2013   IBS (irritable bowel syndrome)    Macular rash 05/20/2019   Multiple nevi 03/01/2015   Pneumonia    Positive TB test    From MArch 2009 note : Recent F/u at [facility]. CXRAy negative.  Positive TST in 2007 (14mm).  Unable to tolerate INH due to hepatotoxicity   Primary insomnia 06/05/2012   Skin macule 04/21/2019   Tinnitus 07/07/2019   URI (upper respiratory infection) 11/01/2010   Qualifier: Diagnosis of  By: Daphine Deutscher FNP, Zena Amos      Current Outpatient Medications on File Prior to Visit  Medication Sig Dispense Refill    albuterol (VENTOLIN HFA) 108 (90 Base) MCG/ACT inhaler USE 2 INHALATIONS BY MOUTH EVERY 6 HOURS AS NEEDED FOR WHEEZING  OR SHORTNESS OF BREATH (Patient taking differently: Inhale 2 puffs into the lungs every 6 (six) hours as needed for wheezing.) 26.8 g 2   Artificial Tear Ointment (DRY EYES OP) Apply 1 drop to eye daily as needed (dry eyes).     Ascorbic Acid (VITAMIN C) 100 MG tablet Take 50 mg by mouth daily.     aspirin EC 81 MG tablet Take 1 tablet (81 mg total) by mouth daily. Swallow whole. 90 tablet 3   carboxymethylcellul-glycerin (OPTIVE) 0.5-0.9 % ophthalmic solution 1 drop 4 (four) times daily as needed for dry eyes.     cefadroxil (DURICEF) 500 MG capsule Take 1 capsule (500 mg total) by mouth 2 (two) times daily for 7 days. 14 capsule 0   cholecalciferol (VITAMIN D3) 25 MCG (1000 UNIT) tablet Take 1 tablet (1,000 Units total) by mouth daily. 60 tablet 1   diltiazem (CARDIZEM) 30 MG tablet Take 1 tablet (30 mg total) by mouth 2 (two) times daily as needed (For palpitations). 30 tablet 6   ezetimibe (ZETIA) 10 MG tablet Take 1 tablet (10 mg total) by mouth daily. 90 tablet 3   fexofenadine (ALLEGRA) 180 MG tablet Take 1 tablet (180 mg total) by mouth daily. 90 tablet 1   Fluticasone-Umeclidin-Vilant (TRELEGY ELLIPTA) 200-62.5-25 MCG/ACT AEPB USE 1 INHALATION BY MOUTH ONCE  DAILY AT THE SAME TIME EACH DAY 180 each 3   mometasone (NASONEX) 50 MCG/ACT nasal spray PLACE 2 SPRAYS IN EACH NOSTRIL ONCE DAILY AS NEEDED. (Patient taking differently: Place 2 sprays into the nose daily.) 17 g 6   Multiple Vitamins-Minerals (MULTIVITAMIN WITH MINERALS) tablet Take 1 tablet by mouth daily.     pantoprazole (PROTONIX) 40 MG tablet Take 1 tablet (40 mg total) by mouth 2 (two) times daily before a meal for 15 days. 30 tablet 0   Current Facility-Administered Medications on File Prior to Visit  Medication Dose Route Frequency Provider Last Rate Last Admin   diclofenac Sodium (VOLTAREN) 1 % topical gel 4  g  4 g Topical Daily PRN         Allergies  Allergen Reactions   Dicyclomine     confusion   Hydrocodone Other (See Comments)    Recovering addict X 29 years; no narcotics per patient   Lipitor [Atorvastatin] Other (See  Comments)    Pt reports causes upset stomach, cramps in legs, poor appetite, and sleep disturbance   Penicillins Other (See Comments)    REACTION: rash   Levsin [Hyoscyamine] Anxiety    Assessment/Plan:  1. Hyperlipidemia -  Problem  Hyperlipidemia   Current Medications: Zetia 10 mg daily  Intolerances: Lipitor - upset stomach, cramps in legs, poor appetite, and sleep disturbance  Risk Factors: CAC score 90,CAD, family hx of ASCVD  LDL goal: <70 Last lipid lab on 06/24/2023 LDLc 105, HDL 129, TG 43, TC 241    Hyperlipidemia Assessment:  LDL goal: < 70 mg/dl last LDLc  811 mg/dl (91/4782) Tolerates Zetia well without any side effects  Intolerance to atorvastatin Discussed benefits of statins especially high intensity statin  Eats healthy balance diet but does not exercise, willing to start 30 min walks 3-4 times per week   Plan: Continue taking ezetimibe 10 mg daily.  Start taking Rosuvastatin 20 mg daily if unable to tolerate reduce doe to 1/2 tab daily. Or every other day Phone f/u due in 4 weeks to assess tolerability   Lipid lab due in 12 weeks     Thank you,  Carmela Hurt, Pharm.D [facility] A Division of [facility] [address] [place], [place] [address]  Phone: [phone]; Fax: [phone]

## 2023-07-19 ENCOUNTER — Telehealth: Payer: Self-pay | Admitting: Internal Medicine

## 2023-07-19 NOTE — Telephone Encounter (Signed)
Patient called after hours pager. She states that she was seen in the ED on 10/26 and was given a course of cefadroxil for a UTI. Since she has started the antibiotic she has felt more fatigued and has had low energy to the point that her son has had to bring her food. She has had 2 loose stools/day, but the stools do have form to them. She states that today her symptoms have gotten worse and she has had trouble focusing on various tasks. All of her symptoms started when she started the antibiotic. I advised the patient to stop her cefadroxil (she already completed a 5 day course) and that I will send a message to our front desk to get her an appt with the [facility]. If her symptoms were to significantly worsen, she knows to go to the ED.  Elza Rafter, DO Internal Medicine Resident, PGY-3

## 2023-07-22 ENCOUNTER — Other Ambulatory Visit: Payer: Self-pay

## 2023-07-22 ENCOUNTER — Ambulatory Visit: Payer: 59 | Admitting: Student

## 2023-07-22 ENCOUNTER — Other Ambulatory Visit: Payer: Self-pay | Admitting: Student

## 2023-07-22 ENCOUNTER — Telehealth: Payer: Self-pay

## 2023-07-22 VITALS — BP 118/63 | HR 65 | Temp 98.0°F | Ht 63.0 in | Wt 150.5 lb

## 2023-07-22 DIAGNOSIS — R5383 Other fatigue: Secondary | ICD-10-CM | POA: Diagnosis not present

## 2023-07-22 DIAGNOSIS — R5382 Chronic fatigue, unspecified: Secondary | ICD-10-CM

## 2023-07-22 MED ORDER — PANTOPRAZOLE SODIUM 40 MG PO TBEC: 40.0000 mg | DELAYED_RELEASE_TABLET | Freq: Two times a day (BID) | ORAL | 0 refills | Status: DC

## 2023-07-22 NOTE — Telephone Encounter (Signed)
Pt states she spoke with Dr. Ned Card on Friday, requesting her to come in for an appt this week. No open slot open this week. Pt want to know what should she do if UTI has not been clear. Please call pt back.

## 2023-07-22 NOTE — Telephone Encounter (Signed)
Spoke to patient states feels like the Antibiotic she was given is not working.  Requestin another Urine sample and an appointment to be seen today.  No available appointments for today or tomorrow.  Patient was advised to go to the ER or an Urgent Care for follow up og =f the UTI.  Patient wanted to just walk in .  Patient was informed that there would be no way of knowing if something would be available.  Patient agreed to go to an Urgent Care or return to [facility] ER for follow up today.

## 2023-07-22 NOTE — Progress Notes (Signed)
CC: Fatigue  HPI:  Aimee Davis is a 67 y.o. female living with a history stated below and presents today for fatigue. Please see problem based assessment and plan for additional details.  Past Medical History:  Diagnosis Date   Abdominal pain 08/16/2008   Qualifier: Diagnosis of  By: Daphine Deutscher FNP, [name]     Abdominal pain, epigastric 09/08/2019   Allergy    seasonal, PCN, antidepressants, bentyl   Aortic atherosclerosis (HCC) 02/23/2021   Arthritis    Asthma    Asymptomatic bacteriuria 08/24/2021   UA showed leukocytes but patient does not have dysuria, fevers or hematuria. She also had bacteruria in her last UA. Her symptoms of fatigue, and loss of appetite are non-specific but if persist or she begins having dysuria we can work up further. A UTI would not explain the abdominal pain patient is having.    Carpal tunnel syndrome on both sides    right worse than left 2008   Cataract 06/11/2013   COPD (chronic obstructive pulmonary disease) (HCC)    COVID-19 virus infection 06/18/2016   Eczema 05/02/2018   Food poisoning 04/04/2021   GERD (gastroesophageal reflux disease)    Hair loss 07/07/2019   Hyperlipidemia    LDL 108 JULY 2013   IBS (irritable bowel syndrome)    Macular rash 05/20/2019   Multiple nevi 03/01/2015   Pneumonia    Positive TB test    From [facility] 2009 note : Recent F/u at [facility]. CXRAy negative.  Positive TST in 2007 (14mm).  Unable to tolerate INH due to hepatotoxicity   Primary insomnia 06/05/2012   Skin macule 04/21/2019   Tinnitus 07/07/2019   URI (upper respiratory infection) 11/01/2010   Qualifier: Diagnosis of  By: Daphine Deutscher FNP, Zena Amos      Current Outpatient Medications on File Prior to Visit  Medication Sig Dispense Refill   albuterol (VENTOLIN HFA) 108 (90 Base) MCG/ACT inhaler USE 2 INHALATIONS BY MOUTH EVERY 6 HOURS AS NEEDED FOR WHEEZING  OR SHORTNESS OF BREATH (Patient taking differently: Inhale 2 puffs into the lungs every 6 (six) hours as needed  for wheezing.) 26.8 g 2   Artificial Tear Ointment (DRY EYES OP) Apply 1 drop to eye daily as needed (dry eyes).     Ascorbic Acid (VITAMIN C) 100 MG tablet Take 50 mg by mouth daily.     aspirin EC 81 MG tablet Take 1 tablet (81 mg total) by mouth daily. Swallow whole. 90 tablet 3   carboxymethylcellul-glycerin (OPTIVE) 0.5-0.9 % ophthalmic solution 1 drop 4 (four) times daily as needed for dry eyes.     cholecalciferol (VITAMIN D3) 25 MCG (1000 UNIT) tablet Take 1 tablet (1,000 Units total) by mouth daily. 60 tablet 1   diltiazem (CARDIZEM) 30 MG tablet Take 1 tablet (30 mg total) by mouth 2 (two) times daily as needed (For palpitations). 30 tablet 6   ezetimibe (ZETIA) 10 MG tablet Take 1 tablet (10 mg total) by mouth daily. 90 tablet 3   fexofenadine (ALLEGRA) 180 MG tablet Take 1 tablet (180 mg total) by mouth daily. 90 tablet 1   Fluticasone-Umeclidin-Vilant (TRELEGY ELLIPTA) 200-62.5-25 MCG/ACT AEPB USE 1 INHALATION BY MOUTH ONCE  DAILY AT THE SAME TIME EACH DAY 180 each 3   mometasone (NASONEX) 50 MCG/ACT nasal spray PLACE 2 SPRAYS IN EACH NOSTRIL ONCE DAILY AS NEEDED. (Patient taking differently: Place 2 sprays into the nose daily.) 17 g 6   Multiple Vitamins-Minerals (MULTIVITAMIN WITH MINERALS) tablet Take 1 tablet  by mouth daily.     pantoprazole (PROTONIX) 40 MG tablet Take 1 tablet (40 mg total) by mouth 2 (two) times daily before a meal for 15 days. 30 tablet 0   rosuvastatin (CRESTOR) 20 MG tablet Take 1 tablet (20 mg total) by mouth daily. 90 tablet 3   Current Facility-Administered Medications on File Prior to Visit  Medication Dose Route Frequency Provider Last Rate Last Admin   diclofenac Sodium (VOLTAREN) 1 % topical gel 4 g  4 g Topical Daily PRN         Family History  Problem Relation Age of Onset   Hypertension Mother    Heart disease Mother    Diabetes Mother    Arthritis Mother    Stroke Father    Diabetes Father    Heart disease Father    Hyperlipidemia Father     Hypertension Father    Kidney disease Father    Hypertension Sister    Breast cancer Sister    Breast cancer Sister    Hypertension Sister    Arthritis Sister    Hypertension Sister    Diabetes Sister    Mental illness Brother    Aneurysm Brother        brain   Arthritis Brother    Hypertension Maternal Aunt    Hypertension Maternal Uncle    Stomach cancer Maternal Uncle    Hypertension Paternal Aunt    Kidney disease Paternal Aunt        x 2   Hypertension Paternal Uncle    Kidney disease Paternal Uncle        x 2   Colon cancer Neg Hx    Esophageal cancer Neg Hx    Pancreatic cancer Neg Hx     Social History   Socioeconomic History   Marital status: Divorced    Spouse name: Not on file   Number of children: 2   Years of education: Not on file   Highest education level: Not on file  Occupational History   Occupation: retired  Tobacco Use   Smoking status: Former    Current packs/day: 0.00    Average packs/day: 0.3 packs/day for 43.0 years (14.2 ttl pk-yrs)    Types: Cigarettes    Start date: 01/06/1970    Quit date: 01/06/2013    Years since quitting: 10.5   Smokeless tobacco: Never  Vaping Use   Vaping status: Never Used  Substance and Sexual Activity   Alcohol use: No    Alcohol/week: 0.0 standard drinks of alcohol   Drug use: Not Currently    Comment: x 26 yrs.   Sexual activity: Not on file    Comment: S/P PARTIAL HYSTERECTOMY  Other Topics Concern   Not on file  Social History Narrative   Not on file   Social Determinants of Health   Financial Resource Strain: Low Risk  (03/05/2023)   Overall Financial Resource Strain (CARDIA)    Difficulty of Paying Living Expenses: Not very hard  Food Insecurity: No Food Insecurity (03/05/2023)   Hunger Vital Sign    Worried About Running Out of Food in the Last Year: Never true    Ran Out of Food in the Last Year: Never true  Transportation Needs: No Transportation Needs (03/05/2023)   PRAPARE -  Administrator, Civil Service (Medical): No    Lack of Transportation (Non-Medical): No  Physical Activity: Insufficiently Active (03/05/2023)   Exercise Vital Sign    Days of Exercise  per Week: 2 days    Minutes of Exercise per Session: 30 min  Stress: No Stress Concern Present (03/05/2023)   [employer] of Occupational Health - Occupational Stress Questionnaire    Feeling of Stress : Only a little  Social Connections: Unknown (03/05/2023)   Social Connection and Isolation Panel [NHANES]    Frequency of Communication with Friends and Family: Patient declined    Frequency of Social Gatherings with Friends and Family: Patient declined    Attends Religious Services: More than 4 times per year    Active Member of [employer] or Organizations: Yes    Attends Banker Meetings: More than 4 times per year    Marital Status: Divorced  Intimate Partner Violence: Patient Declined (03/05/2023)   Humiliation, Afraid, Rape, and Kick questionnaire    Fear of Current or Ex-Partner: Patient declined    Emotionally Abused: Patient declined    Physically Abused: Patient declined    Sexually Abused: Patient declined    Review of Systems: ROS negative except for what is noted on the assessment and plan.  Vitals:   07/22/23 1538  BP: 118/63  Pulse: 65  Temp: 98 F (36.7 C)  TempSrc: Oral  SpO2: 99%  Weight: 150 lb 8 oz (68.3 kg)  Height: 5\' 3"  (1.6 m)    Physical Exam: Constitutional: well-appearing female  in no acute distress HENT: normocephalic atraumatic, mucous membranes moist Eyes: conjunctiva non-erythematous Cardiovascular: regular rate and rhythm, no m/r/g Pulmonary/Chest: normal work of breathing on room air, lungs clear to auscultation bilaterally Abdominal: soft, non-tender, non-distended MSK: normal bulk and tone Neurological: alert & oriented x 3, 5/5 strength in bilateral upper and lower extremities, normal gait   Assessment & Plan:   Fatigue Pt  presenting for follow up regarding her ED follow up. She states that she was experiencing some fatigue and weakness, and when she started experiencing some lower abdominal pain.  In the emergency department, she was fully evaluated including urinalysis which showed large amount of leukocytes and rare bacteria, however she was not experiencing dysuria.  She was sent home with a 5-day course of cefadroxil, however on the fifth day of her course she endorsed more malaise and more fatigue including not being able to get out of bed and having her kids have to bring her food.  She denies any significant weight loss.  She is currently not experiencing any symptoms such as fevers, chills, abdominal pain, nausea, vomiting, diarrhea, constipation.  She is able to ambulate on her own.  And physical exam did not reveal any abnormalities including neurologic.  Etiology currently unclear at this time, as her TSH in April was within normal limits, her vitamin D was low in April of this year however she has been on supplementation.  Previous B12 testing also within normal limits. Pt has Of PVCs, for which she was given metoprolol 25 mg to take as needed previously however per chart review it seems metoprolol made her fatigued.  She was then switched to diltiazem to be used as needed for when she does have palpitations however she has not needed to use diltiazem in the last 3 to 4 weeks on symptom onset.  I do not believe she is depressed at this time.  I believe a BMP and CBC  are good starting points, however this could have been secondary to cefadroxil use and she may have an allergy to this.  She does state that her symptoms have gotten better slightly since the  cefadroxil was discontinued.  Plan: - BMP and CBC - Will check in with the patient and see if she is still having this profound fatigue  Patient discussed with Dr. Lanelle Bal Ember Gottwald, M.D. [facility] Internal Medicine, PGY-2 Pager: [phone] Date  07/22/2023 Time 5:33 PM

## 2023-07-22 NOTE — Telephone Encounter (Signed)
Received a call from pt who's currently at [facility]. Pt stated the doctor there does not know "anything about me" - informed pt they are not on our system (EPIC); unable to acces her chart. Pt stated they already took an urine specimen and was told she might need more tests. Stated she does not feel  comfortable and do not understand what's going on. Stated she  feels very feel; she had a reaction to the medicine (ABX) prescribed by the doctor at [facility] on 10/26. Pt wants to be sen here. Appt became available this afternoon - pt schedule w/Dr Nooruddin @ 1545 PM.

## 2023-07-22 NOTE — Patient Instructions (Signed)
Thank you so much for coming to the clinic today!   We are checking your labs to see if there is anything that might explain your symptoms. I will give you a call when the results are available.   If you have any questions please feel free to the call the clinic at anytime at [phone]. It was a pleasure seeing you!  Best, Dr. Thomasene Ripple

## 2023-07-22 NOTE — Assessment & Plan Note (Addendum)
Pt presenting for follow up regarding her ED follow up. She states that she was experiencing some fatigue and weakness, and when she started experiencing some lower abdominal pain.  In the emergency department, she was fully evaluated including urinalysis which showed large amount of leukocytes and rare bacteria, however she was not experiencing dysuria.  She was sent home with a 5-day course of cefadroxil, however on the fifth day of her course she endorsed more malaise and more fatigue including not being able to get out of bed and having her kids have to bring her food.  She denies any significant weight loss.  She is currently not experiencing any symptoms such as fevers, chills, abdominal pain, nausea, vomiting, diarrhea, constipation.  She is able to ambulate on her own.  And physical exam did not reveal any abnormalities including neurologic.  Etiology currently unclear at this time, as her TSH in April was within normal limits, her vitamin D was low in April of this year however she has been on supplementation.  Previous B12 testing also within normal limits. Pt has Of PVCs, for which she was given metoprolol 25 mg to take as needed previously however per chart review it seems metoprolol made her fatigued.  She was then switched to diltiazem to be used as needed for when she does have palpitations however she has not needed to use diltiazem in the last 3 to 4 weeks on symptom onset.  I do not believe she is depressed at this time.  I believe a BMP and CBC  are good starting points, however this could have been secondary to cefadroxil use and she may have an allergy to this.  She does state that her symptoms have gotten better slightly since the cefadroxil was discontinued.  Plan: - BMP and CBC - Will check in with the patient and see if she is still having this profound fatigue

## 2023-07-23 LAB — BMP8+ANION GAP
Anion Gap: 15 mmol/L (ref 10.0–18.0)
BUN/Creatinine Ratio: 14 (ref 12–28)
BUN: 14 mg/dL (ref 8–27)
CO2: 23 mmol/L (ref 20–29)
Calcium: 10.2 mg/dL (ref 8.7–10.3)
Chloride: 92 mmol/L — ABNORMAL LOW (ref 96–106)
Creatinine, Ser: 0.97 mg/dL (ref 0.57–1.00)
Glucose: 101 mg/dL — ABNORMAL HIGH (ref 70–99)
Potassium: 4.2 mmol/L (ref 3.5–5.2)
Sodium: 130 mmol/L — ABNORMAL LOW (ref 134–144)
eGFR: 64 mL/min/{1.73_m2} (ref >59)

## 2023-07-23 LAB — CBC WITH DIFFERENTIAL/PLATELET
Basophils Absolute: 0 10*3/uL (ref 0.0–0.2)
Basos: 1 %
EOS (ABSOLUTE): 0 10*3/uL (ref 0.0–0.4)
Eos: 0 %
Hematocrit: 40.6 % (ref 34.0–46.6)
Hemoglobin: 13.4 g/dL (ref 11.1–15.9)
Immature Grans (Abs): 0 10*3/uL (ref 0.0–0.1)
Immature Granulocytes: 1 %
Lymphocytes Absolute: 0.8 10*3/uL (ref 0.7–3.1)
Lymphs: 21 %
MCH: 28.2 pg (ref 26.6–33.0)
MCHC: 33 g/dL (ref 31.5–35.7)
MCV: 86 fL (ref 79–97)
Monocytes Absolute: 0.4 10*3/uL (ref 0.1–0.9)
Monocytes: 11 %
Neutrophils Absolute: 2.5 10*3/uL (ref 1.4–7.0)
Neutrophils: 66 %
Platelets: 273 10*3/uL (ref 150–450)
RBC: 4.75 x10E6/uL (ref 3.77–5.28)
RDW: 11.8 % (ref 11.7–15.4)
WBC: 3.7 10*3/uL (ref 3.4–10.8)

## 2023-07-23 NOTE — Progress Notes (Signed)
Internal Medicine Clinic Attending  Case discussed with the resident at the time of the visit.  We reviewed the resident's history and exam and pertinent patient test results.  I agree with the assessment, diagnosis, and plan of care documented in the resident's note.  Agree with plan for further work up.  Unclear cause for fatigue.  Consider vitamin deficiencies at next visit.   Debe Coder, MD

## 2023-07-23 NOTE — Addendum Note (Signed)
Addended by: Debe Coder B on: 07/23/2023 12:23 PM   Modules accepted: Level of Service

## 2023-07-25 ENCOUNTER — Emergency Department (HOSPITAL_BASED_OUTPATIENT_CLINIC_OR_DEPARTMENT_OTHER): Payer: 59 | Admitting: Radiology

## 2023-07-25 ENCOUNTER — Encounter (HOSPITAL_BASED_OUTPATIENT_CLINIC_OR_DEPARTMENT_OTHER): Payer: Self-pay | Admitting: *Deleted

## 2023-07-25 ENCOUNTER — Telehealth: Payer: Self-pay | Admitting: *Deleted

## 2023-07-25 ENCOUNTER — Emergency Department (HOSPITAL_BASED_OUTPATIENT_CLINIC_OR_DEPARTMENT_OTHER): Payer: 59

## 2023-07-25 ENCOUNTER — Emergency Department (HOSPITAL_BASED_OUTPATIENT_CLINIC_OR_DEPARTMENT_OTHER)
Admission: EM | Admit: 2023-07-25 | Discharge: 2023-07-26 | Disposition: A | Discharge status: Home / Self Care | Payer: 59 | Attending: Emergency Medicine | Admitting: Emergency Medicine

## 2023-07-25 ENCOUNTER — Other Ambulatory Visit: Payer: Self-pay

## 2023-07-25 DIAGNOSIS — R531 Weakness: Secondary | ICD-10-CM | POA: Diagnosis not present

## 2023-07-25 DIAGNOSIS — R634 Abnormal weight loss: Secondary | ICD-10-CM | POA: Insufficient documentation

## 2023-07-25 DIAGNOSIS — R946 Abnormal results of thyroid function studies: Secondary | ICD-10-CM | POA: Diagnosis not present

## 2023-07-25 DIAGNOSIS — Z7982 Long term (current) use of aspirin: Secondary | ICD-10-CM | POA: Insufficient documentation

## 2023-07-25 DIAGNOSIS — Z7951 Long term (current) use of inhaled steroids: Secondary | ICD-10-CM | POA: Diagnosis not present

## 2023-07-25 DIAGNOSIS — I2510 Atherosclerotic heart disease of native coronary artery without angina pectoris: Secondary | ICD-10-CM | POA: Diagnosis not present

## 2023-07-25 DIAGNOSIS — J449 Chronic obstructive pulmonary disease, unspecified: Secondary | ICD-10-CM | POA: Insufficient documentation

## 2023-07-25 DIAGNOSIS — R42 Dizziness and giddiness: Secondary | ICD-10-CM | POA: Insufficient documentation

## 2023-07-25 DIAGNOSIS — Z20822 Contact with and (suspected) exposure to covid-19: Secondary | ICD-10-CM | POA: Insufficient documentation

## 2023-07-25 DIAGNOSIS — R4182 Altered mental status, unspecified: Secondary | ICD-10-CM | POA: Diagnosis present

## 2023-07-25 DIAGNOSIS — R41840 Attention and concentration deficit: Secondary | ICD-10-CM | POA: Diagnosis not present

## 2023-07-25 DIAGNOSIS — R61 Generalized hyperhidrosis: Secondary | ICD-10-CM | POA: Diagnosis not present

## 2023-07-25 LAB — COMPREHENSIVE METABOLIC PANEL
ALT: 19 U/L (ref 0–44)
AST: 19 U/L (ref 15–41)
Albumin: 4.6 g/dL (ref 3.5–5.0)
Alkaline Phosphatase: 49 U/L (ref 38–126)
Anion gap: 5 (ref 5–15)
BUN: 8 mg/dL (ref 8–23)
CO2: 28 mmol/L (ref 22–32)
Calcium: 10.6 mg/dL — ABNORMAL HIGH (ref 8.9–10.3)
Chloride: 98 mmol/L (ref 98–111)
Creatinine, Ser: 0.9 mg/dL (ref 0.44–1.00)
GFR, Estimated: >60 mL/min (ref >60)
Glucose, Bld: 106 mg/dL — ABNORMAL HIGH (ref 70–99)
Potassium: 4 mmol/L (ref 3.5–5.1)
Sodium: 131 mmol/L — ABNORMAL LOW (ref 135–145)
Total Bilirubin: 0.5 mg/dL (ref <1.2)
Total Protein: 6.9 g/dL (ref 6.5–8.1)

## 2023-07-25 LAB — CBC
HCT: 39.1 % (ref 36.0–46.0)
Hemoglobin: 13.1 g/dL (ref 12.0–15.0)
MCH: 28.2 pg (ref 26.0–34.0)
MCHC: 33.5 g/dL (ref 30.0–36.0)
MCV: 84.3 fL (ref 80.0–100.0)
Platelets: 253 10*3/uL (ref 150–400)
RBC: 4.64 MIL/uL (ref 3.87–5.11)
RDW: 12.1 % (ref 11.5–15.5)
WBC: 3.7 10*3/uL — ABNORMAL LOW (ref 4.0–10.5)
nRBC: 0 % (ref 0.0–0.2)

## 2023-07-25 LAB — URINALYSIS, ROUTINE W REFLEX MICROSCOPIC
Bilirubin Urine: NEGATIVE
Glucose, UA: NEGATIVE mg/dL
Hgb urine dipstick: NEGATIVE
Ketones, ur: NEGATIVE mg/dL
Nitrite: NEGATIVE
Protein, ur: NEGATIVE mg/dL
Specific Gravity, Urine: <1.005 — ABNORMAL LOW (ref 1.005–1.030)
pH: 5.5 (ref 5.0–8.0)

## 2023-07-25 LAB — SARS CORONAVIRUS 2 BY RT PCR: SARS Coronavirus 2 by RT PCR: NEGATIVE

## 2023-07-25 NOTE — ED Triage Notes (Addendum)
Pt to ED POV reporting "feeling off" and "having trouble thinking clearly and remembering"   Pt reports recent dx of UTI and reports after starting the PO antibiotics patient has had worsening confusion and forgetfulness x 1 week. Pt reporting she recently was driving home and got lost because she forgot where she was going.

## 2023-07-25 NOTE — Discharge Instructions (Addendum)
As discussed, it does not appear like you are symptoms are coming from an infectious source or metabolic source or from a stroke.  Will send in referral for neurology for follow-up as well as attached resources to discharge papers for mental health counseling.  Please do not hesitate to return to the emergency department for worrisome signs and symptoms we discussed become apparent.

## 2023-07-25 NOTE — ED Notes (Signed)
In describing complaint, pt is forgetful of what she is saying, pauses. States she is 'confused, can't focus, foggy-headed, something isn't right'

## 2023-07-25 NOTE — Telephone Encounter (Signed)
Call from patient states has been having problems focusing, forgetful and not feeling herself.  States can be driving and forgets where she is going. States stopped the medication she thinks on Friday.  Did not think it was important to mention to doctor when she was here yesterday.  Calling to day stating that she needs help. And that she is not her self.  Does not want feel that she has any Mental Issues. Steed that she is not depressed.  Things just seem abnormal and she is not functioning like she used to.  Feels isolated.  A friend of hers called and is on the way to her home as her son is at work.  She mentioned what is going on and he did not seem to understand what she was talking about.  Got the number of her friend Karoline Caldwell  and called her at [phone] .  Spoke to Angie who has agreed to take patient over to [facility] ER for Assessment of her what is going on with her.  Patient was informed and is awaiting Angie's arrival.

## 2023-07-25 NOTE — Telephone Encounter (Signed)
Agree with ED evaluation. Forwarding to Dr. Eliberto Ivory who saw her recently. Labs showed a mild hyponatremia, hopefully ER will follow up with repeat labs.

## 2023-07-25 NOTE — Plan of Care (Signed)
Brief Neuro Update:  Briefly, Ms. JUDA LAJEUNESSE is a 17F coming in with 1 week of confusion. Describes it as driving on the road and forget where she is going, cut up an avocado and leave it on the counter and forgot about it. Forget how to pay her online phone bill. Symptoms noted after she was treated for UTI, she thought this was due to antibiotic and stopped but still feels the same. Still having these frequently. Coughing a little but infectious screen is negative. CT Head looks fine.  She is able to recall all of these instances herself and provides history to the ED team.  If she can recall all of the specific instances of confusion, her memory is likely preserved. She also seems to have good insight if she actually came to the ED and called her PCP earlier to discuss this.  I suspect that this is likely poor attention and a potential dementia mimic rather than true dementia. I have seen this in patient with depression where they are so exhausted in general that they cannot pay attention but have also seen this in the setting of poor sleep quality and sleep apnea and again secondary to poor attention.  Recommend checking TSH, B12, Folate, RPR, HIV and start her on multivitamin with outpatient neurology follow up.  Plan was discussed with EDP at [facility].  Erick Blinks [facility]

## 2023-07-25 NOTE — ED Provider Notes (Signed)
[place] EMERGENCY DEPARTMENT AT [facility] Provider Note   CSN: [id number] Arrival date & time: 07/25/23  1554     History  Chief Complaint  Patient presents with   Altered Mental Status    Aimee Davis is a 67 y.o. female.   Altered Mental Status   67 year old female presents to the emergency department with complaints of confusion.  Describes confusion as difficulty remembering when she is performing certain tasks as well as why she drives certain places.  States that she mainly is having difficulty with her memory.  States that she was recently diagnosed with a urinary tract infection on 10/26 at the emergency department.  Was placed on Duricef at that time and states that when she initially began taking the antibiotic felt more confused.  States that symptoms have persisted despite not having taken the antibiotic for the past 3 days or so.  Had urine culture at that time which was negative.  Denies any visual disturbance, gait abnormality, slurred speech, facial droop, weakness/sensory deficits in upper lower extremities.  Does report having cough that is been present for the past 3 days but denies productivity of cough or fever.  Denies any abdominal pain, nausea, vomiting, dysuria, hematuria.  Does report more urinary frequency which she has had for the past week or so.  Denies any other medication changes.  Past medical history significant for COPD, GERD, hyperlipidemia, IBS, CAD, overactive bladder  Home Medications Prior to Admission medications   Medication Sig Start Date End Date Taking? Authorizing Provider  albuterol (VENTOLIN HFA) 108 (90 Base) MCG/ACT inhaler USE 2 INHALATIONS BY MOUTH EVERY 6 HOURS AS NEEDED FOR WHEEZING  OR SHORTNESS OF BREATH Patient taking differently: Inhale 2 puffs into the lungs every 6 (six) hours as needed for wheezing. 12/20/22   Steffanie Rainwater, MD  Artificial Tear Ointment (DRY EYES OP) Apply 1 drop to eye daily as needed (dry  eyes).    [provider]  Ascorbic Acid (VITAMIN C) 100 MG tablet Take 50 mg by mouth daily.    [provider]  aspirin EC 81 MG tablet Take 1 tablet (81 mg total) by mouth daily. Swallow whole. 11/23/22   Orbie Pyo, MD  carboxymethylcellul-glycerin (OPTIVE) 0.5-0.9 % ophthalmic solution 1 drop 4 (four) times daily as needed for dry eyes.    [provider]  cholecalciferol (VITAMIN D3) 25 MCG (1000 UNIT) tablet Take 1 tablet (1,000 Units total) by mouth daily. 12/25/22   Steffanie Rainwater, MD  diltiazem (CARDIZEM) 30 MG tablet Take 1 tablet (30 mg total) by mouth 2 (two) times daily as needed (For palpitations). 03/25/23   [name], MD  ezetimibe (ZETIA) 10 MG tablet Take 1 tablet (10 mg total) by mouth daily. 01/11/23   [name], MD  fexofenadine (ALLEGRA) 180 MG tablet Take 1 tablet (180 mg total) by mouth daily. 12/24/22   Steffanie Rainwater, MD  Fluticasone-Umeclidin-Vilant (TRELEGY ELLIPTA) 200-62.5-25 MCG/ACT AEPB USE 1 INHALATION BY MOUTH ONCE  DAILY AT THE SAME TIME EACH DAY 01/22/23   Steffanie Rainwater, MD  mometasone (NASONEX) 50 MCG/ACT nasal spray PLACE 2 SPRAYS IN EACH NOSTRIL ONCE DAILY AS NEEDED. Patient taking differently: Place 2 sprays into the nose daily. 06/15/21   Dolan Amen, MD  Multiple Vitamins-Minerals (MULTIVITAMIN WITH MINERALS) tablet Take 1 tablet by mouth daily.    [provider]  pantoprazole (PROTONIX) 40 MG tablet Take 1 tablet (40 mg total) by mouth 2 (  two) times daily before a meal for 15 days. 07/22/23 08/06/23  Modena Slater, DO  rosuvastatin (CRESTOR) 20 MG tablet Take 1 tablet (20 mg total) by mouth daily. 07/16/23 10/14/23  Christell Constant, MD      Allergies    Dicyclomine, Hydrocodone, Lipitor [atorvastatin], Penicillins, and Levsin [hyoscyamine]    Review of Systems   Review of Systems  All other systems reviewed and are negative.   Physical Exam Updated Vital Signs BP (!)  142/91   Pulse 78   Temp 97.6 F (36.4 C) (Temporal)   Resp 18   SpO2 100%  Physical Exam Vitals and nursing note reviewed.  Constitutional:      General: She is not in acute distress.    Appearance: She is well-developed.  HENT:     Head: Normocephalic and atraumatic.  Eyes:     Conjunctiva/sclera: Conjunctivae normal.  Cardiovascular:     Rate and Rhythm: Normal rate and regular rhythm.  Pulmonary:     Effort: Pulmonary effort is normal. No respiratory distress.     Breath sounds: Normal breath sounds. No wheezing, rhonchi or rales.  Abdominal:     Palpations: Abdomen is soft.     Tenderness: There is no abdominal tenderness.  Musculoskeletal:        General: No swelling.     Cervical back: Neck supple.  Skin:    General: Skin is warm and dry.     Capillary Refill: Capillary refill takes less than 2 seconds.  Neurological:     Mental Status: She is alert.     Comments: Alert and oriented to self, place, time and event.   Speech is fluent, clear without dysarthria or dysphasia.   Strength 5/5 in upper/lower extremities   Sensation intact in upper/lower extremities   Normal gait.  CN I not tested  CN II not tesed CN III, IV, VI PERRLA and EOMs intact bilaterally  CN V Intact sensation to sharp and light touch to the face  CN VII facial movements symmetric  CN VIII not tested  CN IX, X no uvula deviation, symmetric rise of soft palate  CN XI 5/5 SCM and trapezius strength bilaterally  CN XII Midline tongue protrusion, symmetric L/R movements     Psychiatric:        Mood and Affect: Mood normal.     ED Results / Procedures / Treatments   Labs (all labs ordered are listed, but only abnormal results are displayed) Labs Reviewed  COMPREHENSIVE METABOLIC PANEL - Abnormal; Notable for the following components:      Result Value   Sodium 131 (*)    Glucose, Bld 106 (*)    Calcium 10.6 (*)    All other components within normal limits  CBC - Abnormal; Notable  for the following components:   WBC 3.7 (*)    All other components within normal limits  URINALYSIS, ROUTINE W REFLEX MICROSCOPIC - Abnormal; Notable for the following components:   Specific Gravity, Urine <1.005 (*)    Leukocytes,Ua LARGE (*)    Bacteria, UA RARE (*)    All other components within normal limits  TSH - Abnormal; Notable for the following components:   TSH 9.906 (*)    All other components within normal limits  SARS CORONAVIRUS 2 BY RT PCR  URINE CULTURE  FOLATE  VITAMIN B12  RPR  HIV ANTIBODY (ROUTINE TESTING W REFLEX)    EKG None  Radiology CT Head Wo Contrast  Result Date: 07/25/2023  CLINICAL DATA:  Memory loss EXAM: CT HEAD WITHOUT CONTRAST TECHNIQUE: Contiguous axial images were obtained from the base of the skull through the vertex without intravenous contrast. RADIATION DOSE REDUCTION: This exam was performed according to the departmental dose-optimization program which includes automated exposure control, adjustment of the mA and/or kV according to patient size and/or use of iterative reconstruction technique. COMPARISON:  None Available. FINDINGS: Brain: No acute intracranial abnormality. Specifically, no hemorrhage, hydrocephalus, mass lesion, acute infarction, or significant intracranial injury. Vascular: No hyperdense vessel or unexpected calcification. Skull: No acute calvarial abnormality. Sinuses/Orbits: No acute findings Other: None IMPRESSION: Normal study. Electronically Signed   By: Charlett Nose M.D.   On: 07/25/2023 20:24   DG Chest 2 View  Result Date: 07/25/2023 CLINICAL DATA:  Cough, memory loss, and confusion. EXAM: CHEST - 2 VIEW COMPARISON:  01/07/2023. FINDINGS: The heart size and mediastinal contours are within normal limits. There is atherosclerotic calcification of the aorta. Hyperinflation of the lungs is noted with flattening of the diaphragms and increased AP diameter of the chest, compatible with chronic obstructive pulmonary disease. No  consolidation, effusion, or pneumothorax. Degenerative changes are present in the thoracic spine. IMPRESSION: 1. No active cardiopulmonary disease. 2. Chronic obstructive pulmonary disease. Electronically Signed   By: Thornell Sartorius M.D.   On: 07/25/2023 20:21    Procedures Procedures    Medications Ordered in ED Medications - No data to display  ED Course/ Medical Decision Making/ A&P Clinical Course as of 07/26/23 1155  Thu Jul 25, 2023  2050 Upon reevaluation of the patient when discussing test results, patient described his symptoms slightly different.  States that she has had increasing confusion described as not knowing where she is at while she is driving, not remembering how to pay her bills online when she has done it for several years.  Not knowing how to make breakfast. [CR]  2312 Tsh, folate, b12 folate,rpr, hiv [CR]  Fri Jul 26, 2023  0010 Consulted Dr. Ezzie Dural of neurology who believe the patient is having abnormalities with her attention given that her memory and insight are both intact regarding all occurrences that she describes.  Upon further history of patient, having increased life stressors and feels more depressed which could be tracking symptoms.  Recommendation is for outpatient follow-up per neurology. [CR]    Clinical Course User Index [CR] Peter Garter, PA                                 Medical Decision Making Amount and/or Complexity of Data Reviewed Labs: ordered. Radiology: ordered.   This patient presents to the ED for concern of confusion, this involves an extensive number of treatment options, and is a complaint that carries with it a high risk of complications and morbidity.  The differential diagnosis includes CVA, infection, metabolic derangement, medication side effect, other   Co morbidities that complicate the patient evaluation  See HPI   Additional history obtained:  Additional history obtained from EMR External records from outside  source obtained and reviewed including hospital records   Lab Tests:  I Ordered, and personally interpreted labs.  The pertinent results include: Mild hyponatremia 131, hypercalcemia 10.6 but otherwise, chest within normal limits.  No transaminitis.  No renal dysfunction.  UA with 2050 WBCs, rare bacteria and large leukocytes.  Otherwise unremarkable.  Leukopenia 3.7 which is near patient's baseline.  No evidence of anemia.  Platelets within range.  COVID-negative.   Imaging Studies ordered:  I ordered imaging studies including CT head, chest x-ray I independently visualized and interpreted imaging which showed  CT head: No acute intracranial normalities Chest x-ray: No acute cardiopulmonary maladies I agree with the radiologist interpretation   Cardiac Monitoring: / EKG:  The patient was maintained on a cardiac monitor.  I personally viewed and interpreted the cardiac monitored which showed an underlying rhythm of: Sinus rhythm   Consultations Obtained:  See ED course  Problem List / ED Course / Critical interventions / Medication management  Attention deficit Reevaluation of the patient showed that the patient stayed the same I have reviewed the patients home medicines and have made adjustments as needed   Social Determinants of Health:  Former cigarette use.  Denies illicit drug use.   Test / Admission - Considered:  Attention deficit Vitals signs significant for hypertension blood pressure 142/91. Otherwise within normal range and stable throughout visit. Laboratory/imaging studies significant for: See above 67 year old female presents to the emergency department with complaints of confusion.  Patient describes confusion as forgetting how to perform a task once she is middle of doing it or forgetting where she is at while driving.  Patient is aware of her inability to recall the task or where she is at and has memory of instance in every instance described.  Workup  today overall reassuring.  No significant metabolic derangement.  Patient seems to not be taking any medications with adverse effect on memory/mentation.  No obvious infectious source identified.  UA with rare bacteria, 21-50 WBCs and large leukocytes; had urine that appeared the exact same on 07/13/2023 with negative urine culture.  In the absence of urinary symptoms, will refrain from treatment with antibiotics given similar appearing urine but will culture UA for assessment of potential bacterial pathogen.  Patient was with cough for the past couple days but with negative COVID and negative chest x-ray for pneumonia or other abnormality.  CT head was obtained given patient's reported increased confusion which is negative for any acute intracranial abnormality.  Besides self-reported instances of inability to maintain attention, patient with nonfocal neurologic exam.  Consulted neurology regarding the patient who suspected that the patient is having impairment of attention more so than impairment of memory or insight.  Symptoms do not seem related to stroke.  Patient symptoms somewhat acute onset and patient aware of events and inability to recall; low suspicion for dementia.  No evidence of metabolic derangement.  Neurology specialist suspected the patient is having impairment of attention which is most commonly linked to depression or impaired sleep.  Upon further history, patient does state that she has had increased life stressors as well as more depressed mood during duration of symptoms.  Will obtain additional labs as recommended per neurology.  Recommendation is for outpatient follow-up.  Will also give patient additional counseling resources for further evaluation of patient's increased life stressors/depressed mood.  Further workup deemed unnecessary at this time on the ED.  Treatment plan discussed at length with patient and she acknowledged understanding was agreeable to said plan.  Patient overall  well-appearing, afebrile in no acute distress. Worrisome signs and symptoms were discussed with the patient, and the patient acknowledged understanding to return to the ED if noticed. Patient was stable upon discharge.          Final Clinical Impression(s) / ED Diagnoses Final diagnoses:  Attention deficit    Rx / DC Orders ED Discharge Orders  Ordered    Ambulatory referral to Neurology       Comments: An appointment is requested in approximately: 1 week   07/25/23 2358              Peter Garter, PA 07/26/23 1155    Rozelle Logan, [place] 08/01/23 7829

## 2023-07-26 ENCOUNTER — Encounter (HOSPITAL_BASED_OUTPATIENT_CLINIC_OR_DEPARTMENT_OTHER): Payer: Self-pay | Admitting: Emergency Medicine

## 2023-07-26 ENCOUNTER — Ambulatory Visit (HOSPITAL_BASED_OUTPATIENT_CLINIC_OR_DEPARTMENT_OTHER): Payer: 59 | Admitting: Student

## 2023-07-26 ENCOUNTER — Encounter: Payer: Self-pay | Admitting: Physician Assistant

## 2023-07-26 ENCOUNTER — Other Ambulatory Visit: Payer: Self-pay | Admitting: Internal Medicine

## 2023-07-26 ENCOUNTER — Telehealth (HOSPITAL_BASED_OUTPATIENT_CLINIC_OR_DEPARTMENT_OTHER): Payer: Self-pay

## 2023-07-26 DIAGNOSIS — Z7982 Long term (current) use of aspirin: Secondary | ICD-10-CM | POA: Insufficient documentation

## 2023-07-26 DIAGNOSIS — R946 Abnormal results of thyroid function studies: Secondary | ICD-10-CM | POA: Insufficient documentation

## 2023-07-26 DIAGNOSIS — R4182 Altered mental status, unspecified: Secondary | ICD-10-CM | POA: Diagnosis not present

## 2023-07-26 DIAGNOSIS — E871 Hypo-osmolality and hyponatremia: Secondary | ICD-10-CM | POA: Insufficient documentation

## 2023-07-26 DIAGNOSIS — R531 Weakness: Secondary | ICD-10-CM | POA: Insufficient documentation

## 2023-07-26 LAB — RPR: RPR Ser Ql: NONREACTIVE

## 2023-07-26 LAB — TSH: TSH: 9.906 u[IU]/mL — ABNORMAL HIGH (ref 0.350–4.500)

## 2023-07-26 LAB — URINE CULTURE: Culture: NO GROWTH

## 2023-07-26 LAB — HIV ANTIBODY (ROUTINE TESTING W REFLEX): HIV Screen 4th Generation wRfx: NONREACTIVE

## 2023-07-26 LAB — VITAMIN B12: Vitamin B-12: 960 pg/mL — ABNORMAL HIGH (ref 180–914)

## 2023-07-26 NOTE — Progress Notes (Signed)
Received called about patient who had previously presented to [facility] for fatigue on 11/4 and was recently evaluated at [facility] on 11/7 for altered mental status, 'not feeling right'. She reports that has been getting worse over the past week and now endorses lightheadedness, imbalance concerns to the point she does not feel safe at home. She has also had long stretches of insomnia, severe cold sweats, slow and fast heart beats, and losing her hair. Today, she is is concerned that has not been able to stay in her home alone. Her friend is on the phone and is worried about same. Patient has not fallen or lost concisouness, but she reports that has been close to doing both.  Patient is alert and oriented and is able to recount the interactions at [facility] and [facility]. She also reports that she spoke with a neurologist and that she iwll have a follow up appointment in December.  Of note, no indective symptoms other than several episodes of diarrhea for the past week. Has not endorsed good appetite.  We reviewed the tests collected  Na 131  for the past 3 weeks Hypercalcemia at 10.6 TSH of 9.9 (prior at 4.5 6 months ago) B12 960 NR RPR and HIV  Given the safety concern for this patient, recommended returning to ED vs UC to be evaluated, getting orthostatic vital signs, and fluid resuscitation. Favor repeating CMP and evaluating for hypercalcemia, urine studies for evaluation of hyponatremia, and TSH and FT4. She may benefit from PT evaluation in the ED prior to discharge.  Advised patient to call back if she had any questions and recommended follow up at [facility] for follow up.  Morene Crocker, MD 6:38 PM 07/26/23

## 2023-07-26 NOTE — Telephone Encounter (Signed)
Lvm for pt to cb to find out what test results she is wanting to go over. Per Jean Rosenthal if it is for lab work she will need to see her pcp.

## 2023-07-26 NOTE — Progress Notes (Signed)
Called pt x2 after receiving page from the operator but no answer. Looking through the chat appears pt was calling regarding her testing done the recent ED visit. This can be done

## 2023-07-26 NOTE — ED Triage Notes (Signed)
Patient was seen for confusion and forgetfulness 07/25/2023. Today spoke with on call and was told to return to the ED due to abnormal labs Reports feeling similar to yesterday

## 2023-07-27 ENCOUNTER — Emergency Department (HOSPITAL_BASED_OUTPATIENT_CLINIC_OR_DEPARTMENT_OTHER)
Admission: EM | Admit: 2023-07-27 | Discharge: 2023-07-27 | Disposition: A | Discharge status: Home / Self Care | Payer: 59 | Attending: Emergency Medicine | Admitting: Emergency Medicine

## 2023-07-27 DIAGNOSIS — R4182 Altered mental status, unspecified: Secondary | ICD-10-CM | POA: Diagnosis not present

## 2023-07-27 DIAGNOSIS — R531 Weakness: Secondary | ICD-10-CM

## 2023-07-27 DIAGNOSIS — R7989 Other specified abnormal findings of blood chemistry: Secondary | ICD-10-CM

## 2023-07-27 LAB — CBC WITH DIFFERENTIAL/PLATELET
Abs Immature Granulocytes: 0.01 10*3/uL (ref 0.00–0.07)
Basophils Absolute: 0 10*3/uL (ref 0.0–0.1)
Basophils Relative: 0 %
Eosinophils Absolute: 0 10*3/uL (ref 0.0–0.5)
Eosinophils Relative: 1 %
HCT: 37.7 % (ref 36.0–46.0)
Hemoglobin: 12.6 g/dL (ref 12.0–15.0)
Immature Granulocytes: 0 %
Lymphocytes Relative: 23 %
Lymphs Abs: 0.7 10*3/uL (ref 0.7–4.0)
MCH: 28.2 pg (ref 26.0–34.0)
MCHC: 33.4 g/dL (ref 30.0–36.0)
MCV: 84.3 fL (ref 80.0–100.0)
Monocytes Absolute: 0.4 10*3/uL (ref 0.1–1.0)
Monocytes Relative: 13 %
Neutro Abs: 2 10*3/uL (ref 1.7–7.7)
Neutrophils Relative %: 63 %
Platelets: 246 10*3/uL (ref 150–400)
RBC: 4.47 MIL/uL (ref 3.87–5.11)
RDW: 12.2 % (ref 11.5–15.5)
WBC: 3.2 10*3/uL — ABNORMAL LOW (ref 4.0–10.5)
nRBC: 0 % (ref 0.0–0.2)

## 2023-07-27 LAB — COMPREHENSIVE METABOLIC PANEL
ALT: 17 U/L (ref 0–44)
AST: 16 U/L (ref 15–41)
Albumin: 4.3 g/dL (ref 3.5–5.0)
Alkaline Phosphatase: 60 U/L (ref 38–126)
Anion gap: 6 (ref 5–15)
BUN: 11 mg/dL (ref 8–23)
CO2: 28 mmol/L (ref 22–32)
Calcium: 10.2 mg/dL (ref 8.9–10.3)
Chloride: 102 mmol/L (ref 98–111)
Creatinine, Ser: 0.89 mg/dL (ref 0.44–1.00)
GFR, Estimated: >60 mL/min (ref >60)
Glucose, Bld: 105 mg/dL — ABNORMAL HIGH (ref 70–99)
Potassium: 4.1 mmol/L (ref 3.5–5.1)
Sodium: 136 mmol/L (ref 135–145)
Total Bilirubin: 0.3 mg/dL (ref <1.2)
Total Protein: 6.3 g/dL — ABNORMAL LOW (ref 6.5–8.1)

## 2023-07-27 LAB — MAGNESIUM: Magnesium: 2.2 mg/dL (ref 1.7–2.4)

## 2023-07-27 LAB — FOLATE: Folate: 29.9 ng/mL (ref >5.9)

## 2023-07-27 LAB — T4, FREE: Free T4: 0.99 ng/dL (ref 0.61–1.12)

## 2023-07-27 NOTE — ED Notes (Signed)
Reviewed AVS with patient, patient expressed understanding of directions, denies further questions at this time. 

## 2023-07-28 NOTE — ED Provider Notes (Signed)
[place] EMERGENCY DEPARTMENT AT [facility] Provider Note   CSN: [id number] Arrival date & time: 07/26/23  1953     History  Chief Complaint  Patient presents with   Altered Mental Status    Aimee Davis is a 67 y.o. female.  67 year old female who presents the ER today secondary to the request of her doctor for repeat lab testing and further lab testing.  Patient tells me that she has had 2 to 3 months at least a progressively worsening symptoms but significantly worse over the last few weeks of weakness, tiredness, dizziness, lightheadedness, night sweats, unintended weight loss, generally not feeling well.  She was evaluated the emergency room approximate 24 hours ago had mild hyponatremia at 131, mild hypercalcemia at 10.8, elevated TSH at 9.9 (compared to previous she normally in the low ones however in April she spiked to 4 and now she is at 9), some bacteria in her urine but no urinary symptoms.  Per the PCP office note in the computer requesting urine studies to evaluate the cause of sodium, physical therapy consult because she is dizzy, repeat calcium, repeat TSH with free T4 and route fluid resuscitation.   Altered Mental Status      Home Medications Prior to Admission medications   Medication Sig Start Date End Date Taking? Authorizing Provider  albuterol (VENTOLIN HFA) 108 (90 Base) MCG/ACT inhaler USE 2 INHALATIONS BY MOUTH EVERY 6 HOURS AS NEEDED FOR WHEEZING  OR SHORTNESS OF BREATH Patient taking differently: Inhale 2 puffs into the lungs every 6 (six) hours as needed for wheezing. 12/20/22   Steffanie Rainwater, MD  Artificial Tear Ointment (DRY EYES OP) Apply 1 drop to eye daily as needed (dry eyes).    [provider]  Ascorbic Acid (VITAMIN C) 100 MG tablet Take 50 mg by mouth daily.    [provider]  aspirin EC 81 MG tablet Take 1 tablet (81 mg total) by mouth daily. Swallow whole. 11/23/22   Orbie Pyo, MD   carboxymethylcellul-glycerin (OPTIVE) 0.5-0.9 % ophthalmic solution 1 drop 4 (four) times daily as needed for dry eyes.    [provider]  cholecalciferol (VITAMIN D3) 25 MCG (1000 UNIT) tablet Take 1 tablet (1,000 Units total) by mouth daily. 12/25/22   Steffanie Rainwater, MD  diltiazem (CARDIZEM) 30 MG tablet Take 1 tablet (30 mg total) by mouth 2 (two) times daily as needed (For palpitations). 03/25/23   [name], MD  ezetimibe (ZETIA) 10 MG tablet Take 1 tablet (10 mg total) by mouth daily. 01/11/23   [name], MD  fexofenadine (ALLEGRA) 180 MG tablet Take 1 tablet (180 mg total) by mouth daily. 12/24/22   Steffanie Rainwater, MD  Fluticasone-Umeclidin-Vilant (TRELEGY ELLIPTA) 200-62.5-25 MCG/ACT AEPB USE 1 INHALATION BY MOUTH ONCE  DAILY AT THE SAME TIME EACH DAY 01/22/23   Steffanie Rainwater, MD  mometasone (NASONEX) 50 MCG/ACT nasal spray PLACE 2 SPRAYS IN EACH NOSTRIL ONCE DAILY AS NEEDED. Patient taking differently: Place 2 sprays into the nose daily. 06/15/21   Dolan Amen, MD  Multiple Vitamins-Minerals (MULTIVITAMIN WITH MINERALS) tablet Take 1 tablet by mouth daily.    [provider]  pantoprazole (PROTONIX) 40 MG tablet Take 1 tablet (40 mg total) by mouth 2 (two) times daily before a meal for 15 days. 07/22/23 08/06/23  Modena Slater, DO  rosuvastatin (CRESTOR) 20 MG tablet Take 1 tablet (20 mg total) by mouth daily. 07/16/23 10/14/23  Christell Constant,  MD      Allergies    Dicyclomine, Hydrocodone, Lipitor [atorvastatin], Penicillins, and Levsin [hyoscyamine]    Review of Systems   Review of Systems  Physical Exam Updated Vital Signs BP 125/73   Pulse 65   Temp 98.5 F (36.9 C)   Resp 18   SpO2 99%  Physical Exam Vitals and nursing note reviewed.  Constitutional:      Appearance: She is well-developed.  HENT:     Head: Normocephalic and atraumatic.  Eyes:     Pupils: Pupils are equal, round, and reactive to light.   Cardiovascular:     Rate and Rhythm: Normal rate and regular rhythm.  Pulmonary:     Effort: No respiratory distress.     Breath sounds: No stridor.  Abdominal:     General: There is no distension.  Musculoskeletal:        General: No swelling or tenderness. Normal range of motion.     Cervical back: Normal range of motion.  Skin:    General: Skin is warm and dry.  Neurological:     General: No focal deficit present.     Mental Status: She is alert.     Comments: No altered mental status, able to give full seemingly accurate history.  Face is symmetric, EOM's intact, pupils equal and reactive, vision intact, tongue and uvula midline without deviation. Upper and Lower extremity motor 5/5, intact pain perception in distal extremities, 2+ reflexes in biceps, patella and achilles tendons. Able to perform finger to nose normal with both hands. Walks without assistance or evident ataxia.       ED Results / Procedures / Treatments   Labs (all labs ordered are listed, but only abnormal results are displayed) Labs Reviewed  CBC WITH DIFFERENTIAL/PLATELET - Abnormal; Notable for the following components:      Result Value   WBC 3.2 (*)    All other components within normal limits  COMPREHENSIVE METABOLIC PANEL - Abnormal; Notable for the following components:   Glucose, Bld 105 (*)    Total Protein 6.3 (*)    All other components within normal limits  MAGNESIUM  T4, FREE  CALCIUM, IONIZED    EKG None  Radiology No results found.  Procedures Procedures    Medications Ordered in ED Medications - No data to display  ED Course/ Medical Decision Making/ A&P                                 Medical Decision Making Amount and/or Complexity of Data Reviewed Labs: ordered.  Long discussion with patient that I did not feel that any of the labs from yesterday are likely related to her symptoms except for the TSH.  Her hyponatremia was not significant enough to be symptomatic  and calcium likely was not either.  Her B12 was high however it has been higher than the past without symptoms and is not really a test that we usually do in the ER nor is it really treatable.  I discussed just rechecking her labs to make sure nothing is worse and then I spoke with an internal medicine resident that is on-call for the office and asked them to review everything afterwards.  The calcium improved and normal sodium improved and normal the free T4 and ionized calcium are very delayed secondary to have to be sent out from this facility.  The resident I looked through her labs and  talk about her symptoms did not feel like any other workup was indicated.  She stated that there are appointments early on Monday morning at the patient called early enough she should be able to get in to be seen by their team for further workup and management.  Patient was okay with this plan and will call the office Early in the morning.  Will also send a message to her outpatient providers to ensure they are aware of our decision making here.  Final Clinical Impression(s) / ED Diagnoses Final diagnoses:  Weakness  Elevated TSH    Rx / DC Orders ED Discharge Orders     None         Heinrich Fertig, Barbara Cower, MD 07/28/23 [id number]

## 2023-07-29 ENCOUNTER — Telehealth: Payer: Self-pay | Admitting: *Deleted

## 2023-07-29 NOTE — Telephone Encounter (Signed)
Ok thank you. Agree with an appointment, next available is fine.

## 2023-07-29 NOTE — Telephone Encounter (Signed)
-----   Message from [place] [facility] sent at 07/29/2023  9:15 AM EST ----- Regarding: FW: Patient needs to be seen in clinic the week of 11/11 Pls refer to message below.  Thanks,  [place] ----- Message ----- From: Morene Crocker, MD Sent: 07/28/2023  11:01 PM EST To: Inez Catalina, MD; Imp Front Desk Pool Subject: Patient needs to be seen in clinic the week #  Hi team,  This patient iof ours has been in the ED multiple times in the past 6 months and needs follow up in our clinic. Is there any chance we can schedule her to be seen in the next week? The subject would be multiple ED visits, night sweats, and unintentional weight loss.  Thank you so much, Byrd Hesselbach

## 2023-07-29 NOTE — Telephone Encounter (Signed)
Received a call from pt requesting to be seen today. No available appts at this time. Pt stated she called this am @ 0800 AM (informed office is not open at that time) and @ 0830 AM but no one answered the phone. Pt stated she has no energy, unbalanced, and wants the doctor to look at her labs (she was seen in the ER on 11/9). Stated she's very concern about her health. I told her I will inform her doctor and keep checking for any cancellations.

## 2023-07-30 ENCOUNTER — Ambulatory Visit: Payer: 59 | Admitting: Student

## 2023-07-30 VITALS — BP 113/70 | HR 71 | Ht 63.0 in | Wt 146.7 lb

## 2023-07-30 DIAGNOSIS — R5383 Other fatigue: Secondary | ICD-10-CM

## 2023-07-30 DIAGNOSIS — R5382 Chronic fatigue, unspecified: Secondary | ICD-10-CM

## 2023-07-30 DIAGNOSIS — R7989 Other specified abnormal findings of blood chemistry: Secondary | ICD-10-CM | POA: Diagnosis not present

## 2023-07-30 DIAGNOSIS — E039 Hypothyroidism, unspecified: Secondary | ICD-10-CM

## 2023-07-30 LAB — CALCIUM, IONIZED: Calcium, Ionized, Serum: 5.4 mg/dL (ref 4.5–5.6)

## 2023-07-30 MED ORDER — LEVOTHYROXINE SODIUM 75 MCG PO TABS: 75.0000 ug | ORAL_TABLET | Freq: Every day | ORAL | 11 refills | Status: DC

## 2023-07-30 NOTE — Progress Notes (Signed)
Established Patient Office Visit  Subjective   Patient ID: ARIEONA MURCHIE, female    DOB: 06/24/1956  Age: 67 y.o. MRN: [id number]  Chief Complaint  Patient presents with   Follow-up    ED follow up "I dont feel good"  pain 0/10-would like to go over lab results drawn in ED    Dizziness   Patient is a 67 yo with a past medical history stated below who presents today for follow-up for fatigue and abnormal labs. Please see problem based assessment and plan for additional details.     Past Medical History:  Diagnosis Date   Abdominal pain 08/16/2008   Qualifier: Diagnosis of  By: Daphine Deutscher FNP, [name]     Abdominal pain, epigastric 09/08/2019   Allergy    seasonal, PCN, antidepressants, bentyl   Aortic atherosclerosis (HCC) 02/23/2021   Arthritis    Asthma    Asymptomatic bacteriuria 08/24/2021   UA showed leukocytes but patient does not have dysuria, fevers or hematuria. She also had bacteruria in her last UA. Her symptoms of fatigue, and loss of appetite are non-specific but if persist or she begins having dysuria we can work up further. A UTI would not explain the abdominal pain patient is having.    Carpal tunnel syndrome on both sides    right worse than left 2008   Cataract 06/11/2013   COPD (chronic obstructive pulmonary disease) (HCC)    COVID-19 virus infection 06/18/2016   Eczema 05/02/2018   Food poisoning 04/04/2021   GERD (gastroesophageal reflux disease)    Hair loss 07/07/2019   Hyperlipidemia    LDL 108 JULY 2013   IBS (irritable bowel syndrome)    Macular rash 05/20/2019   Multiple nevi 03/01/2015   Pneumonia    Positive TB test    From [facility] 2009 note : Recent F/u at [facility]. CXRAy negative.  Positive TST in 2007 (14mm).  Unable to tolerate INH due to hepatotoxicity   Primary insomnia 06/05/2012   Skin macule 04/21/2019   Tinnitus 07/07/2019   URI (upper respiratory infection) 11/01/2010   Qualifier: Diagnosis of  By: Daphine Deutscher FNP, Zena Amos     Review of Systems   Constitutional:  Positive for malaise/fatigue.  Psychiatric/Behavioral:  The patient has insomnia.       Objective:     BP 113/70 (BP Location: Right Arm, Patient Position: Sitting, Cuff Size: Normal)   Pulse 71   Ht 5\' 3"  (1.6 m)   Wt 146 lb 11.2 oz (66.5 kg)   SpO2 99%   BMI 25.99 kg/m  BP Readings from Last 3 Encounters:  07/30/23 113/70  07/27/23 125/73  07/25/23 (!) 142/91   Wt Readings from Last 3 Encounters:  07/30/23 146 lb 11.2 oz (66.5 kg)  07/22/23 150 lb 8 oz (68.3 kg)  07/13/23 150 lb (68 kg)    Physical Exam HENT:     Head: Normocephalic and atraumatic.     Nose: Nose normal.     Mouth/Throat:     Mouth: Mucous membranes are moist.  Cardiovascular:     Rate and Rhythm: Normal rate and regular rhythm.     Pulses: Normal pulses.     Heart sounds: Normal heart sounds.  Pulmonary:     Effort: Pulmonary effort is normal.     Breath sounds: Normal breath sounds.  Abdominal:     General: Bowel sounds are normal.     Palpations: Abdomen is soft.  Skin:    General: Skin is warm and  dry.  Neurological:     General: No focal deficit present.     Mental Status: She is alert.  Psychiatric:        Mood and Affect: Mood normal.        Behavior: Behavior normal.    Results for orders placed or performed in visit on 07/30/23  T3  Result Value Ref Range   T3, Total 90 71 - 180 ng/dL    The ASCVD Risk score (Arnett DK, et al., 2019) failed to calculate for the following reasons:   The valid HDL cholesterol range is 20 to 100 mg/dL    Assessment & Plan:   Problem List Items Addressed This Visit     Fatigue    Last seen 07/22/23 at [facility] for fatigue. BMP and CBC at that time unremarkable other than mild hyponatremia. Patient was seen shortly after on 11/07 and 11/09 for confusion and weakness and treated for UTI, discussed recent labs which were not concerning for nutrient deficiency or malignancy. Patient denied dysuria or abdominal pain. Due to elevated  TSH compared to normal TSH April 2024 and history of palpitations, weight loss, and malaise will proceed with further evaluation of her thyroid. Can consider COPD as a contributing factor given history of dyspnea on exertion. If no improvement in symptoms at follow up visit can also consider asking about depression and anxiety (which patient has previously denied, no current treatment).       Elevated TSH    Patient's TSH 9.9 on 11/08, compared to 4.3 on 4/22. T4 and T3 WNL, but patient does show symptoms that could be related to hypothyroidism, specifically a thyroiditis that may have been brought on by a recent viral illness. Patient does have a chronic history of fatigue but she endorses worsening fatigue over the last 2-3 months where she feels low energy and decreased appetite. She denies any history of thyroid disease or family history of thyroid disease. No radiation treatment on chart review. Thyroid physical exam unremarkable today. Discussed treating elevated TSH with medication and reassessing her symptoms at her next appointment at [facility], which was already scheduled for 11/25. Will repeat thyroid function labs in 6 weeks.  - START levothyroxine 75 mcg (lower dose due to age, initial therapy) - Assess symptoms 11/25 - Repeat thyroid labs in 6 weeks      Other Visit Diagnoses     Hypothyroidism, unspecified type    -  Primary   Relevant Medications   levothyroxine (SYNTHROID) 75 MCG tablet   Other Relevant Orders   T3 (Completed)      Return 6 weeks, for Thyroid labs .  Patient seen with Dr. Criselda Peaches.   Anneke Cundy Colbert Coyer, MD

## 2023-07-30 NOTE — Patient Instructions (Signed)
Thank you, [name] for allowing [place] to provide your care today. Today we discussed your fatigue and recent lab results.    I have ordered the following labs for you:  Lab Orders         T3       Tests ordered today:  - none  Referrals ordered today:   Referral Orders  No referral(s) requested today     I have ordered the following medication/changed the following medications:   Stop the following medications: There are no discontinued medications.   Start the following medications: Meds ordered this encounter  Medications   levothyroxine (SYNTHROID) 75 MCG tablet    Sig: Take 1 tablet (75 mcg total) by mouth daily.    Dispense:  30 tablet    Refill:  11     Follow up: 2 weeks for check-in on symptom improvement, 6 weeks for thyroid labs    Remember:   - Please take your levothyroxine 75 mcg every morning on an empty stomach. Do not take at the same time as vitamins. If you take a multivitamin, take it in the evening.   - Please give the medication 2-4 weeks for some improvement. We will ask how you are doing on the medication.   - I will call you with the result of your T3 if it is abnormal, otherwise expect to follow up on labs in 2 weeks and 6 weeks.    Should you have any questions or concerns please call the internal medicine clinic at [phone].     Aimee Caraveo Colbert Coyer, MD PGY-1 Internal Medicine Teaching [facility]

## 2023-07-30 NOTE — Telephone Encounter (Signed)
Open appt this am @ 1045Am. I talked to Dr Naaman Plummer. Pt called /informed of appt; stated she will be here.

## 2023-07-31 ENCOUNTER — Encounter: Payer: Self-pay | Admitting: Student

## 2023-07-31 DIAGNOSIS — R7989 Other specified abnormal findings of blood chemistry: Secondary | ICD-10-CM | POA: Insufficient documentation

## 2023-07-31 LAB — T3: T3, Total: 90 ng/dL (ref 71–180)

## 2023-07-31 NOTE — Assessment & Plan Note (Addendum)
Last seen 07/22/23 at [facility] for fatigue. BMP and CBC at that time unremarkable other than mild hyponatremia. Patient was seen shortly after on 11/07 and 11/09 for confusion and weakness and treated for UTI, discussed recent labs which were not concerning for nutrient deficiency or malignancy. Patient denied dysuria or abdominal pain. Due to elevated TSH compared to normal TSH April 2024 and history of palpitations, weight loss, and malaise will proceed with further evaluation of her thyroid. Can consider COPD as a contributing factor given history of dyspnea on exertion. If no improvement in symptoms at follow up visit can also consider asking about depression and anxiety (which patient has previously denied, no current treatment).

## 2023-07-31 NOTE — Assessment & Plan Note (Addendum)
Patient's TSH 9.9 on 11/08, compared to 4.3 on 4/22. T4 and T3 WNL, but patient does show symptoms that could be related to hypothyroidism, specifically a thyroiditis that may have been brought on by a recent viral illness. Patient does have a chronic history of fatigue but she endorses worsening fatigue over the last 2-3 months where she feels low energy and decreased appetite. She denies any history of thyroid disease or family history of thyroid disease. No radiation treatment on chart review. Thyroid physical exam unremarkable today. Discussed treating elevated TSH with medication and reassessing her symptoms at her next appointment at [facility], which was already scheduled for 11/25. Will repeat thyroid function labs in 6 weeks.  - START levothyroxine 75 mcg (lower dose due to age, initial therapy) - Assess symptoms 11/25 - Repeat thyroid labs in 6 weeks

## 2023-08-01 ENCOUNTER — Telehealth: Payer: Self-pay | Admitting: *Deleted

## 2023-08-01 NOTE — Progress Notes (Signed)
 Internal Medicine Clinic Attending  I was physically present during the key portions of the resident provided service and participated in the medical decision making of patient's management care. I reviewed pertinent patient test results.  The assessment, diagnosis, and plan were formulated together and I agree with the documentation in the resident's note.  Inez Catalina, MD

## 2023-08-01 NOTE — Addendum Note (Signed)
Addended by: Debe Coder B on: 08/01/2023 09:13 AM   Modules accepted: Level of Service

## 2023-08-01 NOTE — Telephone Encounter (Signed)
Call from patient asking if her new medication for her Thyroid may be causing her to be confused.  States went out today and got confused while she was driving.  Wants to know if this is a possible side effect of the medication she was started on yesterday.

## 2023-08-05 ENCOUNTER — Encounter: Payer: Self-pay | Admitting: Pulmonary Disease

## 2023-08-05 ENCOUNTER — Ambulatory Visit (INDEPENDENT_AMBULATORY_CARE_PROVIDER_SITE_OTHER): Payer: 59 | Admitting: Pulmonary Disease

## 2023-08-05 VITALS — BP 130/70 | HR 85 | Temp 97.8°F | Ht 63.0 in | Wt 146.4 lb

## 2023-08-05 DIAGNOSIS — R0609 Other forms of dyspnea: Secondary | ICD-10-CM

## 2023-08-05 DIAGNOSIS — J449 Chronic obstructive pulmonary disease, unspecified: Secondary | ICD-10-CM

## 2023-08-05 MED ORDER — BREZTRI AEROSPHERE 160-9-4.8 MCG/ACT IN AERO: 2.0000 | INHALATION_SPRAY | Freq: Two times a day (BID) | RESPIRATORY_TRACT | Status: DC

## 2023-08-05 NOTE — Progress Notes (Signed)
@Patient  ID: Aimee Davis, female    DOB: 12/23/1955, 67 y.o.   MRN: [id number]  Chief Complaint  Patient presents with  . Consult    Dyspnea with exertion.  Sx since pt had COVID in 2022.  Patient has asthma and COPD.  Some wheezing    Referring provider: Modena Slater, DO  HPI:   67 y.o. woman with history of COPD and asthma whom we are seeing for evaluation of dyspnea on exertion.  Multiple PCP notes reviewed.  Prior cardiology note reviewed.  She has had worsening dyspnea since 2022.  She notes timeframe starting after COVID infection in 2022.  Most winded going up stairs or inclines.  Or carrying things.  No times a day when things are better or worse.  No position to make things better or worse.  No seasonal or environmental factors she can identify them a seems better or worse.  No time of year when things are better or worse.  Worse year-round.  She notes significant nasal allergies, eye allergies watery eyes etc.  These are somewhat seasonal but persistent throughout the year.  She uses albuterol when needed and finds it to be somewhat effective.  She is been on Trelegy for some time.  High dose.  She feels like it has helped some.  But she still has significant symptoms.  Limiting her ability to get around during the day.  Perform tasks around the house.  She has to frequently stop and sit down and rest.  Most recent chest x-ray 07/2023 is clear lungs and hyperinflated on my review interpretation.  She had a CT scan PE protocol with contrast/2024 that on my review interpretation shows no PE, clear lungs, significant emphysema changes in the upper lobes with more mild changes in the lower lobes, lungs clear.  TTE 12/2022 largely normal revealed trivial mitral valve regurgitation.  Questionaires / Pulmonary Flowsheets:   ACT:      No data to display          MMRC:     No data to display          Epworth:      No data to display          Tests:   FENO:  No  results found for: "NITRICOXIDE"  PFT:    Latest Ref Rng & Units 07/21/2020    9:50 AM  PFT Results  FVC-Pre L 2.07   FVC-Predicted Pre % 83   FVC-Post L 2.29   FVC-Predicted Post % 92   Pre FEV1/FVC % % 57   Post FEV1/FCV % % 59   FEV1-Pre L 1.18   FEV1-Predicted Pre % 61   FEV1-Post L 1.34   DLCO uncorrected ml/min/mmHg 10.35   DLCO UNC% % 53   DLVA Predicted % 69   TLC L 5.00   TLC % Predicted % 100   RV % Predicted % 137   Personally reviewed and interpreted as spirometry consistent with moderate COPD, no significant bronchodilator response, lung volumes consistent with air trapping, otherwise normal.  DLCO is moderately reduced.  WALK:      No data to display          Imaging: Personally reviewed and as per EMR and discussion this note CT Head Wo Contrast  Result Date: 07/25/2023 CLINICAL DATA:  Memory loss EXAM: CT HEAD WITHOUT CONTRAST TECHNIQUE: Contiguous axial images were obtained from the base of the skull through the vertex without intravenous contrast. RADIATION DOSE  REDUCTION: This exam was performed according to the departmental dose-optimization program which includes automated exposure control, adjustment of the mA and/or kV according to patient size and/or use of iterative reconstruction technique. COMPARISON:  None Available. FINDINGS: Brain: No acute intracranial abnormality. Specifically, no hemorrhage, hydrocephalus, mass lesion, acute infarction, or significant intracranial injury. Vascular: No hyperdense vessel or unexpected calcification. Skull: No acute calvarial abnormality. Sinuses/Orbits: No acute findings Other: None IMPRESSION: Normal study. Electronically Signed   By: Charlett Nose M.D.   On: 07/25/2023 20:24   DG Chest 2 View  Result Date: 07/25/2023 CLINICAL DATA:  Cough, memory loss, and confusion. EXAM: CHEST - 2 VIEW COMPARISON:  01/07/2023. FINDINGS: The heart size and mediastinal contours are within normal limits. There is atherosclerotic  calcification of the aorta. Hyperinflation of the lungs is noted with flattening of the diaphragms and increased AP diameter of the chest, compatible with chronic obstructive pulmonary disease. No consolidation, effusion, or pneumothorax. Degenerative changes are present in the thoracic spine. IMPRESSION: 1. No active cardiopulmonary disease. 2. Chronic obstructive pulmonary disease. Electronically Signed   By: Thornell Sartorius M.D.   On: 07/25/2023 20:21    Lab Results: Personally reviewed CBC    Component Value Date/Time   WBC 3.2 (L) 07/27/2023 0224   RBC 4.47 07/27/2023 0224   HGB 12.6 07/27/2023 0224   HGB 13.4 07/22/2023 1615   HCT 37.7 07/27/2023 0224   HCT 40.6 07/22/2023 1615   PLT 246 07/27/2023 0224   PLT 273 07/22/2023 1615   MCV 84.3 07/27/2023 0224   MCV 86 07/22/2023 1615   MCH 28.2 07/27/2023 0224   MCHC 33.4 07/27/2023 0224   RDW 12.2 07/27/2023 0224   RDW 11.8 07/22/2023 1615   LYMPHSABS 0.7 07/27/2023 0224   LYMPHSABS 0.8 07/22/2023 1615   MONOABS 0.4 07/27/2023 0224   EOSABS 0.0 07/27/2023 0224   EOSABS 0.0 07/22/2023 1615   BASOSABS 0.0 07/27/2023 0224   BASOSABS 0.0 07/22/2023 1615    BMET    Component Value Date/Time   NA 136 07/27/2023 0224   NA 130 (L) 07/22/2023 1615   K 4.1 07/27/2023 0224   CL 102 07/27/2023 0224   CO2 28 07/27/2023 0224   GLUCOSE 105 (H) 07/27/2023 0224   BUN 11 07/27/2023 0224   BUN 14 07/22/2023 1615   CREATININE 0.89 07/27/2023 0224   CREATININE 1.03 (H) 01/15/2019 1400   CREATININE 0.91 11/18/2014 1407   CALCIUM 10.2 07/27/2023 0224   GFRNONAA >60 07/27/2023 0224   GFRNONAA 57 (L) 01/15/2019 1400   GFRNONAA 69 11/18/2014 1407   GFRAA 90 11/07/2020 1510   GFRAA >60 01/15/2019 1400   GFRAA 79 11/18/2014 1407    BNP    Component Value Date/Time   BNP 14.5 01/07/2023 2229    ProBNP No results found for: "PROBNP"  Specialty Problems       Pulmonary Problems   RHINITIS, ALLERGIC    Qualifier: Diagnosis of   By: Bebe Shaggy   Replacing diagnoses that were inactivated after the 12/17/22 regulatory import      COPD (chronic obstructive pulmonary disease) (HCC)    Qualifier: Diagnosis of  By: Levon Hedger        Upper respiratory infection, viral    Qualifier: Diagnosis of  By: Daphine Deutscher FNP, [name]        Emphysema lung (HCC)    Allergies  Allergen Reactions  . Dicyclomine     confusion  . Hydrocodone Other (See Comments)    Recovering  addict X 29 years; no narcotics per patient  . Lipitor [Atorvastatin] Other (See Comments)    Pt reports causes upset stomach, cramps in legs, poor appetite, and sleep disturbance  . Penicillins Other (See Comments)    REACTION: rash  . Levsin [Hyoscyamine] Anxiety    Immunization History  Administered Date(s) Administered  . Influenza Split 06/05/2012  . Influenza Whole 08/04/2009, 06/16/2010  . Influenza,inj,Quad PF,6+ Mos 07/14/2013, 08/10/2014, 06/24/2018, 06/16/2019, 06/20/2020  . PFIZER(Purple Top)SARS-COV-2 Vaccination 11/27/2019, 12/21/2019, 09/23/2020  . Pneumococcal Polysaccharide-23 11/16/2002  . Td 12/16/2004  . Tdap 04/14/2015    Past Medical History:  Diagnosis Date  . Abdominal pain 08/16/2008   Qualifier: Diagnosis of  By: Daphine Deutscher FNP, Zena Amos    . Abdominal pain, epigastric 09/08/2019  . Allergy    seasonal, PCN, antidepressants, bentyl  . Aortic atherosclerosis (HCC) 02/23/2021  . Arthritis   . Asthma   . Asymptomatic bacteriuria 08/24/2021   UA showed leukocytes but patient does not have dysuria, fevers or hematuria. She also had bacteruria in her last UA. Her symptoms of fatigue, and loss of appetite are non-specific but if persist or she begins having dysuria we can work up further. A UTI would not explain the abdominal pain patient is having.   . Carpal tunnel syndrome on both sides    right worse than left 2008  . Cataract 06/11/2013  . COPD (chronic obstructive pulmonary disease) (HCC)   . COVID-19  virus infection 06/18/2016  . Eczema 05/02/2018  . Food poisoning 04/04/2021  . GERD (gastroesophageal reflux disease)   . Hair loss 07/07/2019  . Hyperlipidemia    LDL 108 JULY 2013  . IBS (irritable bowel syndrome)   . Macular rash 05/20/2019  . Multiple nevi 03/01/2015  . Pneumonia   . Positive TB test    From [facility] 2009 note : Recent F/u at [facility]. CXRAy negative.  Positive TST in 2007 (14mm).  Unable to tolerate INH due to hepatotoxicity  . Primary insomnia 06/05/2012  . Skin macule 04/21/2019  . Tinnitus 07/07/2019  . URI (upper respiratory infection) 11/01/2010   Qualifier: Diagnosis of  By: Daphine Deutscher FNP, Zena Amos      Tobacco History: Social History   Tobacco Use  Smoking Status Former  . Current packs/day: 0.00  . Average packs/day: 0.3 packs/day for 43.0 years (14.2 ttl pk-yrs)  . Types: Cigarettes  . Start date: 01/06/1970  . Quit date: 01/06/2013  . Years since quitting: 10.5  Smokeless Tobacco Never   Counseling given: Not Answered   Continue to not smoke  Outpatient Encounter Medications as of 08/05/2023  Medication Sig  . albuterol (VENTOLIN HFA) 108 (90 Base) MCG/ACT inhaler USE 2 INHALATIONS BY MOUTH EVERY 6 HOURS AS NEEDED FOR WHEEZING  OR SHORTNESS OF BREATH (Patient taking differently: Inhale 2 puffs into the lungs every 6 (six) hours as needed for wheezing.)  . Artificial Tear Ointment (DRY EYES OP) Apply 1 drop to eye daily as needed (dry eyes).  . Ascorbic Acid (VITAMIN C) 100 MG tablet Take 50 mg by mouth daily.  Marland Kitchen aspirin EC 81 MG tablet Take 1 tablet (81 mg total) by mouth daily. Swallow whole.  . Budeson-Glycopyrrol-Formoterol (BREZTRI AEROSPHERE) 160-9-4.8 MCG/ACT AERO Inhale 2 puffs into the lungs in the morning and at bedtime.  . carboxymethylcellul-glycerin (OPTIVE) 0.5-0.9 % ophthalmic solution 1 drop 4 (four) times daily as needed for dry eyes.  . cholecalciferol (VITAMIN D3) 25 MCG (1000 UNIT) tablet Take 1 tablet (1,000 Units total) by mouth  daily.  .  diltiazem (CARDIZEM) 30 MG tablet Take 1 tablet (30 mg total) by mouth 2 (two) times daily as needed (For palpitations).  . ezetimibe (ZETIA) 10 MG tablet Take 1 tablet (10 mg total) by mouth daily.  . fexofenadine (ALLEGRA) 180 MG tablet Take 1 tablet (180 mg total) by mouth daily.  . Fluticasone-Umeclidin-Vilant (TRELEGY ELLIPTA) 200-62.5-25 MCG/ACT AEPB USE 1 INHALATION BY MOUTH ONCE  DAILY AT THE SAME TIME EACH DAY  . levothyroxine (SYNTHROID) 75 MCG tablet Take 1 tablet (75 mcg total) by mouth daily.  . mometasone (NASONEX) 50 MCG/ACT nasal spray PLACE 2 SPRAYS IN EACH NOSTRIL ONCE DAILY AS NEEDED. (Patient taking differently: Place 2 sprays into the nose daily.)  . Multiple Vitamins-Minerals (MULTIVITAMIN WITH MINERALS) tablet Take 1 tablet by mouth daily.  . pantoprazole (PROTONIX) 40 MG tablet Take 1 tablet (40 mg total) by mouth 2 (two) times daily before a meal for 15 days.  . rosuvastatin (CRESTOR) 20 MG tablet Take 1 tablet (20 mg total) by mouth daily.   Facility-Administered Encounter Medications as of 08/05/2023  Medication  . diclofenac Sodium (VOLTAREN) 1 % topical gel 4 g     Review of Systems  Review of Systems  No chest pain with exertion.  No orthopnea or PND.  Comprehensive review of systems otherwise negative. Physical Exam  BP 130/70 (BP Location: Right Arm, Patient Position: Sitting, Cuff Size: Normal)   Pulse 85   Temp 97.8 F (36.6 C) (Oral)   Ht 5\' 3"  (1.6 m)   Wt 146 lb 6.4 oz (66.4 kg)   SpO2 97%   BMI 25.93 kg/m   Wt Readings from Last 5 Encounters:  08/05/23 146 lb 6.4 oz (66.4 kg)  07/30/23 146 lb 11.2 oz (66.5 kg)  07/22/23 150 lb 8 oz (68.3 kg)  07/13/23 150 lb (68 kg)  05/28/23 152 lb (68.9 kg)    BMI Readings from Last 5 Encounters:  08/05/23 25.93 kg/m  07/30/23 25.99 kg/m  07/22/23 26.66 kg/m  07/13/23 26.57 kg/m  05/28/23 26.93 kg/m     Physical Exam General: Sitting in chair, no acute distress Eyes: EOMI, no  icterus Neck: Supple, no JVD Pulmonary: Clear, normal work of breathing Cardiovascular: Warm, no edema Abdomen: Nondistended, sounds present MSK: No synovitis with no joint effusion Neuro: Normal gait, no weakness Psych: Normal mood, full affect  Assessment & Plan:   Dyspnea on exertion: Suspect multifactorial related to COPD and air trapping as demonstrated on PFTs in 2021, asthma, possibly worsened after COVID infection given onset of symptoms worsening after infection in 2022, cardiac causes such as mitral valve regurgitation seen on TTE 12/2022.  Suspect lungs as primary driver.  On Trelegy, aggressive medication.  Will alter medications as below.  COPD: Moderate on PFTs 07/2020.  On triple inhaled therapy via Trelegy with uncontrolled dyspnea.  Change to [facility] via samples, if beneficial she will let me know and I can prescribe.  Discussed role of O[place]ayre as well in the future, should consider this if change in inhaler is not very effective.  Recommend pulmonary rehab, she would prefer not to do this at this time but we should ask at every visit as I think this will be very beneficial.  Asthma: Possibly contributing to uncontrolled symptoms of dyspnea.  Onset of worsening after COVID infection would make sense, possible postviral triggering of worsening asthma.  Altering inhalers as above.  Eosinophils not elevated in the past.  Recommend IgE, allergen panel to assist in  choosing biologic agents in the future if symptoms not improved with changes as above.  She declined lab work today.   Return in about 3 months (around 11/05/2023) for f/u Dr. Judeth Horn.   Karren Burly, MD 08/05/2023   This appointment required 61 minutes of patient care (this includes precharting, chart review, review of results, face-to-face care, etc.).

## 2023-08-05 NOTE — Patient Instructions (Signed)
Nice to meet you  Try Breztri 2 puffs twice a day every day, rinse your mouth out with water after every use.  I provided samples for this.  Each sample will last 7 days.  Please see me a message after 2 weeks let me know if it is more helpful than the Trelegy or if you feel better on the [facility] and the Trelegy.  Stop using the Trelegy while using the Breztri.  If there is no difference between the 2, we can resume the Trelegy after trying Breztri.  It is hard to say if this is just worsening of the COPD.  If so we could add a new nebulized medicine twice a day in the future to see if it helps.  It is possible that the asthma is worsened your COVID infection in 2022.  Viral infections can make asthma worse.  If so, we should consider adding an injection medicine for asthma.  We will need lab work to make the best decision on this.  We can do the lab work in the future if we need to.  Lastly, I recommend pulmonary rehab.  This is an exercise program to improve your endurance and shortness of breath.  We should consider doing this in the future regardless of if the medicines help or not.  Return to clinic in 3 months or sooner as needed with Dr. Judeth Horn

## 2023-08-06 ENCOUNTER — Telehealth: Payer: Self-pay | Admitting: Student

## 2023-08-06 NOTE — Telephone Encounter (Signed)
Pt states she was supposed to have a medication called in for her but is unsure what the name of it is. Please advise

## 2023-08-06 NOTE — Telephone Encounter (Signed)
I spoke with patient. She reports she was started on a new medication for her cholesterol but she has not received it.  Chart reviewed and patient was started on Rosuvastatin.  I gave patient instructions from pharmacist for Rosuvastatin.  I checked with [facility] and they have not filled as they are waiting to hear from our office if OK to fill due to Atorvastatin allergy.  I told pharmacist it was OK to fill.  [facility] will send medication to patient.  Patient made aware.

## 2023-08-12 ENCOUNTER — Ambulatory Visit: Payer: 59 | Admitting: Internal Medicine

## 2023-08-12 VITALS — BP 121/63 | HR 82 | Temp 98.0°F | Ht 63.0 in | Wt 148.9 lb

## 2023-08-12 DIAGNOSIS — R946 Abnormal results of thyroid function studies: Secondary | ICD-10-CM | POA: Diagnosis not present

## 2023-08-12 DIAGNOSIS — R7989 Other specified abnormal findings of blood chemistry: Secondary | ICD-10-CM

## 2023-08-12 NOTE — Progress Notes (Unsigned)
CC: thyroid  HPI:  [name] is a 67 y.o. female living with a history stated below and presents today for a follow up of her thyroid since starting levothyroxine 2 weeks ago. Please see problem based assessment and plan for additional details.  Past Medical History:  Diagnosis Date   Abdominal pain 08/16/2008   Qualifier: Diagnosis of  By: Daphine Deutscher FNP, [name]     Abdominal pain, epigastric 09/08/2019   Allergy    seasonal, PCN, antidepressants, bentyl   Aortic atherosclerosis (HCC) 02/23/2021   Arthritis    Asthma    Asymptomatic bacteriuria 08/24/2021   UA showed leukocytes but patient does not have dysuria, fevers or hematuria. She also had bacteruria in her last UA. Her symptoms of fatigue, and loss of appetite are non-specific but if persist or she begins having dysuria we can work up further. A UTI would not explain the abdominal pain patient is having.    Carpal tunnel syndrome on both sides    right worse than left 2008   Cataract 06/11/2013   COPD (chronic obstructive pulmonary disease) (HCC)    COVID-19 virus infection 06/18/2016   Eczema 05/02/2018   Food poisoning 04/04/2021   GERD (gastroesophageal reflux disease)    Hair loss 07/07/2019   Hyperlipidemia    LDL 108 JULY 2013   IBS (irritable bowel syndrome)    Macular rash 05/20/2019   Multiple nevi 03/01/2015   Pneumonia    Positive TB test    From [facility] 2009 note : Recent F/u at [facility]. CXRAy negative.  Positive TST in 2007 (14mm).  Unable to tolerate INH due to hepatotoxicity   Primary insomnia 06/05/2012   Skin macule 04/21/2019   Tinnitus 07/07/2019   URI (upper respiratory infection) 11/01/2010   Qualifier: Diagnosis of  By: Daphine Deutscher FNP, Zena Amos      Current Outpatient Medications on File Prior to Visit  Medication Sig Dispense Refill   albuterol (VENTOLIN HFA) 108 (90 Base) MCG/ACT inhaler USE 2 INHALATIONS BY MOUTH EVERY 6 HOURS AS NEEDED FOR WHEEZING  OR SHORTNESS OF BREATH (Patient taking differently:  Inhale 2 puffs into the lungs every 6 (six) hours as needed for wheezing.) 26.8 g 2   Artificial Tear Ointment (DRY EYES OP) Apply 1 drop to eye daily as needed (dry eyes).     Ascorbic Acid (VITAMIN C) 100 MG tablet Take 50 mg by mouth daily.     aspirin EC 81 MG tablet Take 1 tablet (81 mg total) by mouth daily. Swallow whole. 90 tablet 3   Budeson-Glycopyrrol-Formoterol (BREZTRI AEROSPHERE) 160-9-4.8 MCG/ACT AERO Inhale 2 puffs into the lungs in the morning and at bedtime.     carboxymethylcellul-glycerin (OPTIVE) 0.5-0.9 % ophthalmic solution 1 drop 4 (four) times daily as needed for dry eyes.     cholecalciferol (VITAMIN D3) 25 MCG (1000 UNIT) tablet Take 1 tablet (1,000 Units total) by mouth daily. 60 tablet 1   diltiazem (CARDIZEM) 30 MG tablet Take 1 tablet (30 mg total) by mouth 2 (two) times daily as needed (For palpitations). 30 tablet 6   ezetimibe (ZETIA) 10 MG tablet Take 1 tablet (10 mg total) by mouth daily. 90 tablet 3   fexofenadine (ALLEGRA) 180 MG tablet Take 1 tablet (180 mg total) by mouth daily. 90 tablet 1   Fluticasone-Umeclidin-Vilant (TRELEGY ELLIPTA) 200-62.5-25 MCG/ACT AEPB USE 1 INHALATION BY MOUTH ONCE  DAILY AT THE SAME TIME EACH DAY 180 each 3   mometasone (NASONEX) 50 MCG/ACT nasal spray PLACE  2 SPRAYS IN EACH NOSTRIL ONCE DAILY AS NEEDED. (Patient taking differently: Place 2 sprays into the nose daily.) 17 g 6   Multiple Vitamins-Minerals (MULTIVITAMIN WITH MINERALS) tablet Take 1 tablet by mouth daily.     pantoprazole (PROTONIX) 40 MG tablet Take 1 tablet (40 mg total) by mouth 2 (two) times daily before a meal for 15 days. 30 tablet 0   rosuvastatin (CRESTOR) 20 MG tablet Take 1 tablet (20 mg total) by mouth daily. 90 tablet 3   Current Facility-Administered Medications on File Prior to Visit  Medication Dose Route Frequency Provider Last Rate Last Admin   diclofenac Sodium (VOLTAREN) 1 % topical gel 4 g  4 g Topical Daily PRN         Family History   Problem Relation Age of Onset   Hypertension Mother    Heart disease Mother    Diabetes Mother    Arthritis Mother    Stroke Father    Diabetes Father    Heart disease Father    Hyperlipidemia Father    Hypertension Father    Kidney disease Father    Hypertension Sister    Breast cancer Sister    Breast cancer Sister    Hypertension Sister    Arthritis Sister    Hypertension Sister    Diabetes Sister    Mental illness Brother    Aneurysm Brother        brain   Arthritis Brother    Hypertension Maternal Aunt    Hypertension Maternal Uncle    Stomach cancer Maternal Uncle    Hypertension Paternal Aunt    Kidney disease Paternal Aunt        x 2   Hypertension Paternal Uncle    Kidney disease Paternal Uncle        x 2   Colon cancer Neg Hx    Esophageal cancer Neg Hx    Pancreatic cancer Neg Hx     Social History   Socioeconomic History   Marital status: Divorced    Spouse name: Not on file   Number of children: 2   Years of education: Not on file   Highest education level: Not on file  Occupational History   Occupation: retired  Tobacco Use   Smoking status: Former    Current packs/day: 0.00    Average packs/day: 0.3 packs/day for 43.0 years (14.2 ttl pk-yrs)    Types: Cigarettes    Start date: 01/06/1970    Quit date: 01/06/2013    Years since quitting: 10.6   Smokeless tobacco: Never  Vaping Use   Vaping status: Never Used  Substance and Sexual Activity   Alcohol use: No    Alcohol/week: 0.0 standard drinks of alcohol   Drug use: Not Currently    Comment: x 26 yrs.   Sexual activity: Not on file    Comment: S/P PARTIAL HYSTERECTOMY  Other Topics Concern   Not on file  Social History Narrative   Not on file   Social Determinants of Health   Financial Resource Strain: Low Risk  (03/05/2023)   Overall Financial Resource Strain (CARDIA)    Difficulty of Paying Living Expenses: Not very hard  Food Insecurity: No Food Insecurity (03/05/2023)   Hunger  Vital Sign    Worried About Running Out of Food in the Last Year: Never true    Ran Out of Food in the Last Year: Never true  Transportation Needs: No Transportation Needs (03/05/2023)   PRAPARE - Transportation  Lack of Transportation (Medical): No    Lack of Transportation (Non-Medical): No  Physical Activity: Insufficiently Active (03/05/2023)   Exercise Vital Sign    Days of Exercise per Week: 2 days    Minutes of Exercise per Session: 30 min  Stress: No Stress Concern Present (03/05/2023)   [employer] of Occupational Health - Occupational Stress Questionnaire    Feeling of Stress : Only a little  Social Connections: Unknown (03/05/2023)   Social Connection and Isolation Panel [NHANES]    Frequency of Communication with Friends and Family: Patient declined    Frequency of Social Gatherings with Friends and Family: Patient declined    Attends Religious Services: More than 4 times per year    Active Member of [employer] or Organizations: Yes    Attends Banker Meetings: More than 4 times per year    Marital Status: Divorced  Intimate Partner Violence: Patient Declined (03/05/2023)   Humiliation, Afraid, Rape, and Kick questionnaire    Fear of Current or Ex-Partner: Patient declined    Emotionally Abused: Patient declined    Physically Abused: Patient declined    Sexually Abused: Patient declined    Review of Systems: ROS negative except for what is noted on the assessment and plan.  Vitals:   08/12/23 1413  BP: 121/63  Pulse: 82  Temp: 98 F (36.7 C)  TempSrc: Oral  SpO2: 97%  Weight: 148 lb 14.4 oz (67.5 kg)  Height: 5\' 3"  (1.6 m)    Physical Exam: Constitutional: tearful, anxious female sitting in chair, in no acute distress Cardiovascular: regular rate and rhythm, no m/r/g Pulmonary/Chest: normal work of breathing on room air, lungs clear to auscultation bilaterally MSK: normal bulk and tone Neurological: alert & oriented x 3, no focal deficit Skin:  warm and dry Psych: anxious, depressed mood and behavior  Assessment & Plan:   ED visits  Fatigue:  Last seen 07/22/23 at [facility] for fatigue. BMP and CBC at that time unremarkable other than mild hyponatremia. Patient was seen shortly after on 11/07 and 11/09 for confusion and weakness and treated for UTI, discussed recent labs which were not concerning for nutrient deficiency or malignancy. Patient denied dysuria or abdominal pain. Due to elevated TSH compared to normal TSH April 2024 and history of palpitations, weight loss, and malaise will proceed with further evaluation of her thyroid. Can consider COPD as a contributing factor given history of dyspnea on exertion. If no improvement in symptoms at follow up visit can also consider asking about depression and anxiety (which patient has previously denied, no current treatment).    - depression? Sleepy? DOE? Cbc normal  Muscle pain?  TSH: - just here 2 weeks ago... started on levothyroxine 75 mg - taking 30-60 min before you eat - mood has changed since she started it - tearful - tired, wants to lay in bed - depressed, no motivation to do anything - does not feel right, does not like herself - tremors, no diarrhea - sleeps 4-5 hrs at now, wake up and can't go back to sleep  -  no snoring - no DOE walking to clinic - no muscle pain/weakness  Patient discussed with Dr. Sol Blazing  Elevated TSH TSH on 11/8 was elevated to 9.9, but T3 and T4 were within normal limits. The patient had presented with fatigue and it was thought that hypothyroidism was the cause of her sxs, and thus, she was started on synthroid 75 mcg daily. The patient has been taking this medication  every morning, before eating, each day and states that since starting it, she has not felt well and does not feel like herself. She is very tearful, stating that she has had very little appetite since starting this medicine and she can barely eat. She also has noticed a big difference  in her mood, stating that she is depressed and has wanted to just lay in bed all day - which is unusual for her. The patient also endorses tremors and feeling shaky. She has been sleeping on 4-5 hrs/night and once she wakes up, she cannot go back to sleep.   I do think her symptoms are related to her synthroid and that perhaps the thyroid was not the cause of her fatigue afterall (perhaps she has subclinical hypothyroidism).  Plan: - STOP levothyroxine today - Follow up in ~4 weeks to see how she is feeling and to recheck thyroid labs   Lenea Bywater, D.O. [facility] Internal [facility], PGY-3 Phone: [phone] Date 08/12/2023 Time 3:20 PM

## 2023-08-12 NOTE — Assessment & Plan Note (Signed)
TSH on 11/8 was elevated to 9.9, but T3 and T4 were within normal limits. The patient had presented with fatigue and it was thought that hypothyroidism was the cause of her sxs, and thus, she was started on synthroid 75 mcg daily. The patient has been taking this medication every morning, before eating, each day and states that since starting it, she has not felt well and does not feel like herself. She is very tearful, stating that she has had very little appetite since starting this medicine and she can barely eat. She also has noticed a big difference in her mood, stating that she is depressed and has wanted to just lay in bed all day - which is unusual for her. The patient also endorses tremors and feeling shaky. She has been sleeping on 4-5 hrs/night and once she wakes up, she cannot go back to sleep.   I do think her symptoms are related to her synthroid and that perhaps the thyroid was not the cause of her fatigue afterall (perhaps she has subclinical hypothyroidism).  Plan: - STOP levothyroxine today - Follow up in ~4 weeks to see how she is feeling and to recheck thyroid labs

## 2023-08-12 NOTE — Patient Instructions (Signed)
Thank you, [name] for allowing [place] to provide your care today. Today we discussed:  Thyroid I am so sorry you are not feeling right and are feeling that your mood has worsened, that you are not sleeping well, and that your appetite has been affected You should STOP taking levothyroxine everyday Let's see you again in 4 weeks to recheck your thyroid at that time and to see how you are feeling  I have ordered the following labs for you:  Lab Orders  No laboratory test(s) ordered today      Referrals ordered today:   Referral Orders  No referral(s) requested today     I have ordered the following medication/changed the following medications:   Stop the following medications: Medications Discontinued During This Encounter  Medication Reason   levothyroxine (SYNTHROID) 75 MCG tablet      Start the following medications: No orders of the defined types were placed in this encounter.    Follow up:  4 weeks      Should you have any questions or concerns please call the internal medicine clinic at [phone].     Aimee Davis, D.O. [facility]

## 2023-08-13 ENCOUNTER — Telehealth: Payer: Self-pay

## 2023-08-13 NOTE — Telephone Encounter (Signed)
This patient called the [facility] stating that she does not feel well and feels "wacky". When I saw this patient yesterday, she was not feeling like herself, noting that she had very little appetite, had not been sleeping well, had tremors, and her mood had been down all since starting levothyroxine. Her TSH level was elevated on previous lab work, but her T3 and T4 were within normal limits. Her symptoms correlated with the timing of taking levothyroxine and thus, we stopped the medication.   Today when she spoke with the RN, in addition to feeling "wacky" and not focused, she stated that she had been staying at her mother's house and that she did not feel safe. She denied harming herself to the RN and requested a return call.  I have called this patient multiple times between 4:30-4:50 pm but have not been able to reach her. I left a message asking her to call our after hours number to reach the on-call resident. Additionally, I called her mother who the patient had been staying with. The patient's sister answered the phone and stated that the patient was not currently home and the only way to reach her would be by cell phone.   I am worried about this patient, especially given the fact that she stated that she does not feel safe. Levothyroxine has a long half life and is still in her system, which could still be the cause for why the patient feels so poorly. I want to make sure she is safe and has no intentions of harming herself (she did tell the nurse she does not wish to harm herself) or others. If I am able to reach her, I will encourage her to go to the ED, as it sounds like her symptoms have acutely worsened since her last visit yesterday.

## 2023-08-13 NOTE — Telephone Encounter (Signed)
Reached the patient by phone at 5:20 PM. She states that she continues to not feel like herself and her anxiety is still as bad as yesterday. She does not feel safe, but states that she has no thoughts of harming herself or others. When she states that she does not feel safe, she means that she wants to feel like she can take care of herself and not feel like a burden on her mother. She is also afraid of "being cooped up".   I do not think she is an imminent threat to herself, or others, but still advised her to go the the [facility] or the [facility] ED but she repeatedly states that she is afraid she will be hospitalized and that people will think she is crazy. I repeatedly told her that we do not think she is crazy, and rather, she is experiencing an adverse reaction to her levothyroxine. She does not sound acutely manic, but does seem to be experiencing severe anxiety and brain fog. Regarding her brain fog: the patient drove to [employer] today to try to run some errands and get out of the house- she states that when she got there, she did not remember why she was there.    Again, my recommendation was for the patient to seek further care today, but she declined at this time. She is hoping she will start to feel better when levothyroxine gets out of her system and I echoed this sentiment. I do not think she is a threat to herself or others at this time and I provided the patient with the address and phone number to the [facility].

## 2023-08-13 NOTE — Telephone Encounter (Addendum)
Pt states she is not feeling well today, complaining of feeling wacky and unable to stay focused. Pt states she was told not to take her thyroid medication for 4 weeks. Ever since then, pt states she is feeling like she is having anxiety attack. Requesting to speak with a nurse to see what she need to do. Please call back.

## 2023-08-13 NOTE — Addendum Note (Signed)
Addended by: Dickie La on: 08/13/2023 08:56 AM   Modules accepted: Level of Service

## 2023-08-13 NOTE — Telephone Encounter (Signed)
Pt's return call - she repeated what Hme had written. She's wondering if this is from coming off the thyroid medication. She has been staying at her mother's house. "I don't feel safe" - denies harming her self. Stated she has been staying in the house; she does not want to be around anyone. Requesting a return call.

## 2023-08-13 NOTE — Progress Notes (Signed)
Internal Medicine Clinic Attending  Case discussed with the resident at the time of the visit.  We reviewed the resident's history and exam and pertinent patient test results.  I agree with the assessment, diagnosis, and plan of care documented in the resident's note.  

## 2023-08-13 NOTE — Telephone Encounter (Signed)
Return pt's call - no answer;left message of the office's return call.

## 2023-08-28 NOTE — Progress Notes (Signed)
Assessment/Plan:   Aimee Davis is a very pleasant 67 y.o. year old RH female with extensive medical history including history of hypertension, hyperlipidemia, asthma, arthritis, cataracts, COPD-emphysema, GERD, insomnia, anxiety, hypothyroidism, seen today for evaluation of recent episode of confusion seen at the hospital immediately on 724.  At the time, she was driving on the road and forgot what she was doing.  Neurology had seen the patient, waiting negative workup.  CT of the head was without acute findings.  Per report, memory was preserved and have good insight, but ADD was suspected along with other culprits such as possible worsening quality and sleep apnea.  MoCA today is /30.***.  Workup is in progress.  Patient is able to participate on his IADLs and continues to drive without significant difficulties.***    Memory Impairment of unclear etiology  MRI brain without contrast to assess for underlying structural abnormality and assess vascular load  Neurocognitive testing to further evaluate cognitive concerns and determine other underlying cause of memory changes, including potential contribution from sleep, anxiety, attention, or depression  Check B12 Check EEG for TAA Continue to control mood as per PCP Recommend good control of cardiovascular risk factors Folllow up pending on the results from above.  No indication for antidementia meds Recommend ADD investigation as per PCP, we do not treat ADD in this office.  Subjective:   The patient is accompanied by ***  who supplements the history.   How long did patient have memory difficulties?  She has "brain fog and attention issues "for the last ***.  Reports some difficulty remembering new information, conversations and names.  Long-term memory is good. repeats oneself?  Endorsed Disoriented when walking into a room?  Patient denies except occasionally not remembering what patient came to the room for ***  Leaving objects  in unusual places? Denies.   Wandering behavior?  denies .  Any personality changes?  She has a history of anxiety, increased for the last month, "not feeling safe ", and has been very tearfu depressed l, has been referred to behavioral health. Any history of depression?:  Denies   Hallucinations or paranoia?  Denies   Seizures?  Denies    Any sleep changes?   Does not sleep well, has insomnia, sleeping 4 to 5 hours a night and if she wakes up she can go back to sleep,***enies vivid dreams, REM behavior or sleepwalking   Sleep apnea?  Denies   Any hygiene concerns?  Denies   Independent of bathing and dressing?  Endorsed  Does the patient needs help with medications? Patient is in charge *** Who is in charge of the finances? Patient is in charge   *** Any changes in appetite?  Very little appetite.***   Patient have trouble swallowing? Denies.   Does the patient cook? ***Not much. Any kitchen accidents such as leaving the stove on? Denies.   Any history of headaches?   Denies.   Chronic pain ? Denies.   Ambulates with difficulty?  Denies. *** Recent falls or head injuries? Denies.   Vision changes?  History of cataracts.*** Unilateral weakness, numbness or tingling? Denies.   Any tremors?  Due to increased anxiety, she has been experiencing intermittent tremors without any other parkinsonian features. Any anosmia?  Denies.   Any incontinence of urine? Denies.   Any bowel dysfunction? Denies.      Patient lives with  *** History of heavy alcohol intake? Denies.   History of heavy tobacco use? Denies.  Family history of dementia? Denies.  Does patient drive? Yes, recently she had a moment of confusion, driving to [employer] on to run some areas and get out of the house, but when she got there she could not remember why she was here.***   Past Medical History:  Diagnosis Date   Abdominal pain 08/16/2008   Qualifier: Diagnosis of  By: Daphine Deutscher FNP, [name]     Abdominal pain,  epigastric 09/08/2019   Allergy    seasonal, PCN, antidepressants, bentyl   Aortic atherosclerosis (HCC) 02/23/2021   Arthritis    Asthma    Asymptomatic bacteriuria 08/24/2021   UA showed leukocytes but patient does not have dysuria, fevers or hematuria. She also had bacteruria in her last UA. Her symptoms of fatigue, and loss of appetite are non-specific but if persist or she begins having dysuria we can work up further. A UTI would not explain the abdominal pain patient is having.    Carpal tunnel syndrome on both sides    right worse than left 2008   Cataract 06/11/2013   COPD (chronic obstructive pulmonary disease) (HCC)    COVID-19 virus infection 06/18/2016   Eczema 05/02/2018   Food poisoning 04/04/2021   GERD (gastroesophageal reflux disease)    Hair loss 07/07/2019   Hyperlipidemia    LDL 108 JULY 2013   IBS (irritable bowel syndrome)    Macular rash 05/20/2019   Multiple nevi 03/01/2015   Pneumonia    Positive TB test    From [facility] 2009 note : Recent F/u at [facility]. CXRAy negative.  Positive TST in 2007 (14mm).  Unable to tolerate INH due to hepatotoxicity   Primary insomnia 06/05/2012   Skin macule 04/21/2019   Tinnitus 07/07/2019   URI (upper respiratory infection) 11/01/2010   Qualifier: Diagnosis of  By: Daphine Deutscher FNP, Zena Amos       Past Surgical History:  Procedure Laterality Date   BREAST CYST EXCISION Right 2011   BREAST EXCISIONAL BIOPSY     benign   BREAST EXCISIONAL BIOPSY Right 1990   benign   INDUCED ABORTION  2005   VAGINAL HYSTERECTOMY  1983   partial; for abnormal bleeding     Allergies  Allergen Reactions   Dicyclomine     confusion   Hydrocodone Other (See Comments)    Recovering addict X 29 years; no narcotics per patient   Lipitor [Atorvastatin] Other (See Comments)    Pt reports causes upset stomach, cramps in legs, poor appetite, and sleep disturbance   Penicillins Other (See Comments)    REACTION: rash   Levsin [Hyoscyamine] Anxiety    Current  Outpatient Medications  Medication Instructions   albuterol (VENTOLIN HFA) 108 (90 Base) MCG/ACT inhaler USE 2 INHALATIONS BY MOUTH EVERY 6 HOURS AS NEEDED FOR WHEEZING  OR SHORTNESS OF BREATH   Artificial Tear Ointment (DRY EYES OP) 1 drop, Ophthalmic, Daily PRN   aspirin EC 81 mg, Oral, Daily, Swallow whole.   Budeson-Glycopyrrol-Formoterol (BREZTRI AEROSPHERE) 160-9-4.8 MCG/ACT AERO 2 puffs, Inhalation, 2 times daily   carboxymethylcellul-glycerin (OPTIVE) 0.5-0.9 % ophthalmic solution 1 drop, 4 times daily PRN   cholecalciferol (VITAMIN D3) 1,000 Units, Oral, Daily   diltiazem (CARDIZEM) 30 mg, Oral, 2 times daily PRN   ezetimibe (ZETIA) 10 mg, Oral, Daily   fexofenadine (ALLEGRA) 180 mg, Oral, Daily   Fluticasone-Umeclidin-Vilant (TRELEGY ELLIPTA) 200-62.5-25 MCG/ACT AEPB USE 1 INHALATION BY MOUTH ONCE  DAILY AT THE SAME TIME EACH DAY   mometasone (NASONEX) 50 MCG/ACT nasal spray PLACE  2 SPRAYS IN EACH NOSTRIL ONCE DAILY AS NEEDED.   Multiple Vitamins-Minerals (MULTIVITAMIN WITH MINERALS) tablet 1 tablet, Oral, Daily   pantoprazole (PROTONIX) 40 mg, Oral, 2 times daily before meals   rosuvastatin (CRESTOR) 20 mg, Oral, Daily   vitamin C 50 mg, Oral, Daily     VITALS:  There were no vitals filed for this visit.    PHYSICAL EXAM   HEENT:  Normocephalic, atraumatic. The superficial temporal arteries are without ropiness or tenderness. Cardiovascular: Regular rate and rhythm. Lungs: Clear to auscultation bilaterally. Neck: There are no carotid bruits noted bilaterally.  NEUROLOGICAL:     No data to display              No data to display           Orientation:  Alert and oriented to person, place and time. No aphasia or dysarthria. Fund of knowledge is appropriate. Recent memory impaired and remote memory intact.  Attention and concentration are normal.  Able to name objects and repeat phrases. Delayed recall  /5 Cranial nerves: There is good facial symmetry. Extraocular  muscles are intact and visual fields are full to confrontational testing. Speech is fluent and clear. No tongue deviation. Hearing is intact to conversational tone. Tone: Tone is good throughout. Sensation: Sensation is intact to light touch. Vibration is intact at the bilateral big toe.  Coordination: The patient has no difficulty with RAM's or FNF bilaterally. Normal finger to nose  Motor: Strength is 5/5 in the bilateral upper and lower extremities. There is no pronator drift. There are no fasciculations noted. DTR's: Deep tendon reflexes are 2/4 bilaterally. Gait and Station: The patient is able to ambulate without difficulty The patient is able to heel toe walk . Gait is cautious and narrow. The patient is able to ambulate in a tandem fashion.       Thank you for allowing [place] the opportunity to participate in the care of this nice patient. Please do not hesitate to contact us for any questions or concerns.   Total time spent on today's visit was *** minutes dedicated to this patient today, preparing to see patient, examining the patient, ordering tests and/or medications and counseling the patient, documenting clinical information in the EHR or other health record, independently interpreting results and communicating results to the patient/family, discussing treatment and goals, answering patient's questions and coordinating care.  Cc:  Modena Slater, DO  [facility] 08/28/2023 7:21 AM

## 2023-08-29 ENCOUNTER — Ambulatory Visit: Payer: 59

## 2023-08-29 ENCOUNTER — Ambulatory Visit (INDEPENDENT_AMBULATORY_CARE_PROVIDER_SITE_OTHER): Payer: 59 | Admitting: Physician Assistant

## 2023-08-29 ENCOUNTER — Encounter: Payer: Self-pay | Admitting: Physician Assistant

## 2023-08-29 ENCOUNTER — Other Ambulatory Visit: Payer: 59

## 2023-08-29 VITALS — BP 120/75 | Resp 20 | Ht 63.0 in | Wt 145.0 lb

## 2023-08-29 DIAGNOSIS — R404 Transient alteration of awareness: Secondary | ICD-10-CM | POA: Diagnosis not present

## 2023-08-29 DIAGNOSIS — R413 Other amnesia: Secondary | ICD-10-CM

## 2023-08-29 NOTE — Patient Instructions (Addendum)
It was a pleasure to see you today at our office.   Recommendations:  Neurocognitive evaluation at our office   MRI of the brain, the radiology office will call you to arrange you appointment   at [phone] Check labs today  [address] EEG Follow up in 3  months Agree with psychotherapy      For psychiatric meds, mood meds: Please have your primary care physician manage these medications.  If you have any severe symptoms of a stroke, or other severe issues such as confusion,severe chills or fever, etc call 911 or go to the ER as you may need to be evaluated further   For guidance regarding [facility] and if placement were needed at the facility, contact Social Worker tel: [phone]  For assessment of decision of mental capacity and competency:  Call Dr. Erick Blinks, geriatric psychiatrist at [phone]  Counseling regarding caregiver distress, including caregiver depression, anxiety and issues regarding community resources, adult day care programs, adult living facilities, or memory care questions:  please contact your  Primary Doctor's Social Worker   Whom to call: Memory  decline, memory medications: Call our office [phone]    [email or web address]   RECOMMENDATIONS FOR ALL PATIENTS WITH MEMORY PROBLEMS: 1. Continue to exercise (Recommend 30 minutes of walking everyday, or 3 hours every week) 2. Increase social interactions - continue going to [place] and enjoy social gatherings with friends and family 3. Eat healthy, avoid fried foods and eat more fruits and vegetables 4. Maintain adequate blood pressure, blood sugar, and blood cholesterol level. Reducing the risk of stroke and cardiovascular disease also helps promoting better memory. 5. Avoid stressful situations. Live a simple life and avoid aggravations. Organize your time and prepare for the next day in anticipation. 6. Sleep well, avoid  any interruptions of sleep and avoid any distractions in the bedroom that may interfere with adequate sleep quality 7. Avoid sugar, avoid sweets as there is a strong link between excessive sugar intake, diabetes, and cognitive impairment We discussed the Mediterranean diet, which has been shown to help patients reduce the risk of progressive memory disorders and reduces cardiovascular risk. This includes eating fish, eat fruits and green leafy vegetables, nuts like almonds and hazelnuts, walnuts, and also use olive oil. Avoid fast foods and fried foods as much as possible. Avoid sweets and sugar as sugar use has been linked to worsening of memory function.  There is always a concern of gradual progression of memory problems. If this is the case, then we may need to adjust level of care according to patient needs. Support, both to the patient and caregiver, should then be put into place.      You have been referred for a neuropsychological evaluation (i.e., evaluation of memory and thinking abilities). Please bring someone with you to this appointment if possible, as it is helpful for the doctor to hear from both you and another adult who knows you well. Please bring eyeglasses and hearing aids if you wear them.    The evaluation will take approximately 3 hours and has two parts:   The first part is a clinical interview with the neuropsychologist (Dr. Milbert Coulter or Dr. Roseanne Reno). During the interview, the neuropsychologist will speak with you and the individual you brought to the appointment.    The second part of the evaluation is testing with the doctor's technician Annabelle Harman or Selena Batten). During the testing, the technician will ask you to remember different types of material, solve problems, and  answer some questionnaires. Your family member will not be present for this portion of the evaluation.   Please note: We must reserve several hours of the neuropsychologist's time and the psychometrician's time for your  evaluation appointment. As such, there is a No-Show fee of $100. If you are unable to attend any of your appointments, please contact our office as soon as possible to reschedule.      DRIVING: Regarding driving, in patients with progressive memory problems, driving will be impaired. We advise to have someone else do the driving if trouble finding directions or if minor accidents are reported. Independent driving assessment is available to determine safety of driving.   If you are interested in the driving assessment, you can contact the following:  [employer] [facility] in [place] [phone]  [facility] [phone]  [facility] [phone]  [facility] [phone] or [phone]   FALL PRECAUTIONS: Be cautious when walking. Scan the area for obstacles that may increase the risk of trips and falls. When getting up in the mornings, sit up at the edge of the bed for a few minutes before getting out of bed. Consider elevating the bed at the head end to avoid drop of blood pressure when getting up. Walk always in a well-lit room (use night lights in the walls). Avoid area rugs or power cords from appliances in the middle of the walkways. Use a walker or a cane if necessary and consider physical therapy for balance exercise. Get your eyesight checked regularly.  FINANCIAL OVERSIGHT: Supervision, especially oversight when making financial decisions or transactions is also recommended.  HOME SAFETY: Consider the safety of the kitchen when operating appliances like stoves, microwave oven, and blender. Consider having supervision and share cooking responsibilities until no longer able to participate in those. Accidents with firearms and other hazards in the house should be identified and addressed as well.   ABILITY TO BE LEFT ALONE: If patient is unable to contact 911 operator, consider using LifeLine, or when the need is there, arrange for someone to  stay with patients. Smoking is a fire hazard, consider supervision or cessation. Risk of wandering should be assessed by caregiver and if detected at any point, supervision and safe proof recommendations should be instituted.  MEDICATION SUPERVISION: Inability to self-administer medication needs to be constantly addressed. Implement a mechanism to ensure safe administration of the medications.      Mediterranean Diet A Mediterranean diet refers to food and lifestyle choices that are based on the traditions of countries located on the [employer]. This way of eating has been shown to help prevent certain conditions and improve outcomes for people who have chronic diseases, like kidney disease and heart disease. What are tips for following this plan? Lifestyle  Cook and eat meals together with your family, when possible. Drink enough fluid to keep your urine clear or pale yellow. Be physically active every day. This includes: Aerobic exercise like running or swimming. Leisure activities like gardening, walking, or housework. Get 7-8 hours of sleep each night. If recommended by your health care provider, drink red wine in moderation. This means 1 glass a day for nonpregnant women and 2 glasses a day for men. A glass of wine equals 5 oz (150 mL). Reading food labels  Check the serving size of packaged foods. For foods such as rice and pasta, the serving size refers to the amount of cooked product, not dry. Check the total fat in packaged foods. Avoid foods that  have saturated fat or trans fats. Check the ingredients list for added sugars, such as corn syrup. Shopping  At the grocery store, buy most of your food from the areas near the walls of the store. This includes: Fresh fruits and vegetables (produce). Grains, beans, nuts, and seeds. Some of these may be available in unpackaged forms or large amounts (in bulk). Fresh seafood. Poultry and eggs. Low-fat dairy products. Buy whole  ingredients instead of prepackaged foods. Buy fresh fruits and vegetables in-season from local farmers markets. Buy frozen fruits and vegetables in resealable bags. If you do not have access to quality fresh seafood, buy precooked frozen shrimp or canned fish, such as tuna, salmon, or sardines. Buy small amounts of raw or cooked vegetables, salads, or olives from the deli or salad bar at your store. Stock your pantry so you always have certain foods on hand, such as olive oil, canned tuna, canned tomatoes, rice, pasta, and beans. Cooking  Cook foods with extra-virgin olive oil instead of using butter or other vegetable oils. Have meat as a side dish, and have vegetables or grains as your main dish. This means having meat in small portions or adding small amounts of meat to foods like pasta or stew. Use beans or vegetables instead of meat in common dishes like chili or lasagna. Experiment with different cooking methods. Try roasting or broiling vegetables instead of steaming or sauteing them. Add frozen vegetables to soups, stews, pasta, or rice. Add nuts or seeds for added healthy fat at each meal. You can add these to yogurt, salads, or vegetable dishes. Marinate fish or vegetables using olive oil, lemon juice, garlic, and fresh herbs. Meal planning  Plan to eat 1 vegetarian meal one day each week. Try to work up to 2 vegetarian meals, if possible. Eat seafood 2 or more times a week. Have healthy snacks readily available, such as: Vegetable sticks with hummus. Greek yogurt. Fruit and nut trail mix. Eat balanced meals throughout the week. This includes: Fruit: 2-3 servings a day Vegetables: 4-5 servings a day Low-fat dairy: 2 servings a day Fish, poultry, or lean meat: 1 serving a day Beans and legumes: 2 or more servings a week Nuts and seeds: 1-2 servings a day Whole grains: 6-8 servings a day Extra-virgin olive oil: 3-4 servings a day Limit red meat and sweets to only a few servings  a month What are my food choices? Mediterranean diet Recommended Grains: Whole-grain pasta. Brown rice. Bulgar wheat. Polenta. Couscous. Whole-wheat bread. Orpah Cobb. Vegetables: Artichokes. Beets. Broccoli. Cabbage. Carrots. Eggplant. Green beans. Chard. Kale. Spinach. Onions. Leeks. Peas. Squash. Tomatoes. Peppers. Radishes. Fruits: Apples. Apricots. Avocado. Berries. Bananas. Cherries. Dates. Figs. Grapes. Lemons. Melon. Oranges. Peaches. Plums. Pomegranate. Meats and other protein foods: Beans. Almonds. Sunflower seeds. Pine nuts. Peanuts. Cod. Salmon. Scallops. Shrimp. Tuna. Tilapia. Clams. Oysters. Eggs. Dairy: Low-fat milk. Cheese. Greek yogurt. Beverages: Water. Red wine. Herbal tea. Fats and oils: Extra virgin olive oil. Avocado oil. Grape seed oil. Sweets and desserts: [place] yogurt with honey. Baked apples. Poached pears. Trail mix. Seasoning and other foods: Basil. Cilantro. Coriander. Cumin. Mint. Parsley. Sage. Rosemary. Tarragon. Garlic. Oregano. Thyme. Pepper. Balsalmic vinegar. Tahini. Hummus. Tomato sauce. Olives. Mushrooms. Limit these Grains: Prepackaged pasta or rice dishes. Prepackaged cereal with added sugar. Vegetables: Deep fried potatoes (french fries). Fruits: Fruit canned in syrup. Meats and other protein foods: Beef. Pork. Lamb. Poultry with skin. Hot dogs. Tomasa Blase. Dairy: Ice cream. Sour cream. Whole milk. Beverages: Juice. Sugar-sweetened soft drinks. Beer.  Liquor and spirits. Fats and oils: Butter. Canola oil. Vegetable oil. Beef fat (tallow). Lard. Sweets and desserts: Cookies. Cakes. Pies. Candy. Seasoning and other foods: Mayonnaise. Premade sauces and marinades. The items listed may not be a complete list. Talk with your dietitian about what dietary choices are right for you. Summary The Mediterranean diet includes both food and lifestyle choices. Eat a variety of fresh fruits and vegetables, beans, nuts, seeds, and whole grains. Limit the amount of  red meat and sweets that you eat. Talk with your health care provider about whether it is safe for you to drink red wine in moderation. This means 1 glass a day for nonpregnant women and 2 glasses a day for men. A glass of wine equals 5 oz (150 mL). This information is not intended to replace advice given to you by your health care provider. Make sure you discuss any questions you have with your health care provider. Document Released: 04/26/2016 Document Revised: 05/29/2016 Document Reviewed: 04/26/2016 Elsevier Interactive Patient Education  2017 [employer].

## 2023-08-30 LAB — TSH: TSH: 4.36 m[IU]/L (ref 0.40–4.50)

## 2023-08-30 LAB — VITAMIN B12: Vitamin B-12: 1094 pg/mL (ref 200–1100)

## 2023-08-30 NOTE — Progress Notes (Signed)
b12 and thyroid levels are normal, thanks

## 2023-09-02 ENCOUNTER — Other Ambulatory Visit: Payer: Self-pay

## 2023-09-02 MED ORDER — ALBUTEROL SULFATE HFA 108 (90 BASE) MCG/ACT IN AERS: 1.0000 | INHALATION_SPRAY | Freq: Four times a day (QID) | RESPIRATORY_TRACT | 2 refills | Status: AC | PRN

## 2023-09-03 ENCOUNTER — Ambulatory Visit (INDEPENDENT_AMBULATORY_CARE_PROVIDER_SITE_OTHER): Payer: 59

## 2023-09-03 DIAGNOSIS — R404 Transient alteration of awareness: Secondary | ICD-10-CM | POA: Diagnosis not present

## 2023-09-03 NOTE — Progress Notes (Unsigned)
EEG complete - results pending 

## 2023-09-04 NOTE — Procedures (Signed)
ELECTROENCEPHALOGRAM REPORT  Date of Study: 09/03/2023  Patient's Name: Aimee Davis MRN: [id number] Date of Birth: Oct 22, 1955  Referring Provider: Marlowe Kays, PA-C  Clinical History: This is a 67 year old woman with a transient episode of confusion. EEG for classification.  CNS active Medications: none  Technical Summary: A multichannel digital 1-hour EEG recording measured by the international 10-20 system with electrodes applied with paste and impedances below 5000 ohms performed in our laboratory with EKG monitoring in an awake and drowsy patient.  Hyperventilation was not performed. Photic stimulation was performed.  The digital EEG was referentially recorded, reformatted, and digitally filtered in a variety of bipolar and referential montages for optimal display.    Description: The patient is awake and drowsy during the recording.  During maximal wakefulness, there is a symmetric, medium voltage 8.5-9 Hz posterior dominant rhythm that attenuates with eye opening.  The record is symmetric.  During drowsiness, there is an increase in theta slowing of the background.  Sleep was not captured. Photic stimulation did not elicit any abnormalities.  There were no epileptiform discharges or electrographic seizures seen.    EKG lead was unremarkable.  Impression: This 1-hour awake and drowsy EEG is normal.    Clinical Correlation: A normal EEG does not exclude a clinical diagnosis of epilepsy.  If further clinical questions remain, prolonged EEG may be helpful.  Clinical correlation is advised.   Patrcia Dolly, M.D.

## 2023-09-05 ENCOUNTER — Telehealth: Payer: Self-pay

## 2023-09-05 NOTE — Telephone Encounter (Signed)
Pt is requesting a call back .Marland Kitchen She stated that she has been having a dry cough for the past 3 days

## 2023-09-05 NOTE — Telephone Encounter (Signed)
Return pt's call. Stated she has been coughing x 3-4 days. Coughing up white phlegm. Dry cough mostly during the day. She has tried Robitussin and drinking ginger/honey. Otherwise, she feels fine and she does not want to get worse. She wants to know what else can she do/take that will not irritate her stomach. Thanks

## 2023-09-12 ENCOUNTER — Ambulatory Visit: Payer: 59 | Attending: Internal Medicine | Admitting: Internal Medicine

## 2023-09-12 NOTE — Progress Notes (Deleted)
Cardiology Office Note:    Date:  09/12/2023   ID:  Aimee Davis, DOB 1956-05-18, MRN [id number]  PCP:  Modena Slater, DO   [facility]  Cardiologist:  [name], MD (Inactive)  Advanced Practice Provider:  No care team member to display Electrophysiologist:  None   Referring MD: Modena Slater, DO    History of Present Illness:    Aimee Davis is a 67 y.o. female with a hx of chronic fatigue, GERD and asthma who presents for follow-up.   Patient initially seen in clinic where she was being seen for DOE and chest pain. Coronary CTA 11/2020 with minimal disease in RCA. Ca score 90 (83% for age and sex matched control). TTE 12/2020 with LVEF 60-65%, normal RV, no significant valve disease. It was deemed her symptoms were not cardiac in nature.  Was admitted in 12/2022 for chest pressure. Deemed to be noncardiac in nature given extensive prior reassuring work-up.  Today, the patient overall feels okay. Reports she has been having episodes of intermittent palpitations that has been ongoing since her hospitalization. Cardiac monitor with occasional SVE but no significant arrhythmias. Symptoms seem to increase with her inhaler use. Otherwise, no chest pain, orthopnea or PND. Continues to have dyspnea on exertion that has worsened in the summer months. Has been using her inhaler more. She is hoping to be referred to a Pulmonologist to assess further.   Has tried metop in the past but made her fatigued  Past Medical History:  Diagnosis Date   Abdominal pain 08/16/2008   Qualifier: Diagnosis of  By: Daphine Deutscher FNP, [name]     Abdominal pain, epigastric 09/08/2019   Allergy    seasonal, PCN, antidepressants, bentyl   Aortic atherosclerosis (HCC) 02/23/2021   Arthritis    Asthma    Asymptomatic bacteriuria 08/24/2021   UA showed leukocytes but patient does not have dysuria, fevers or hematuria. She also had bacteruria in her last UA. Her symptoms of fatigue,  and loss of appetite are non-specific but if persist or she begins having dysuria we can work up further. A UTI would not explain the abdominal pain patient is having.    Carpal tunnel syndrome on both sides    right worse than left 2008   Cataract 06/11/2013   COPD (chronic obstructive pulmonary disease) (HCC)    COVID-19 virus infection 06/18/2016   Eczema 05/02/2018   Food poisoning 04/04/2021   GERD (gastroesophageal reflux disease)    Hair loss 07/07/2019   Hyperlipidemia    LDL 108 JULY 2013   IBS (irritable bowel syndrome)    Macular rash 05/20/2019   Multiple nevi 03/01/2015   Pneumonia    Positive TB test    From MArch 2009 note : Recent F/u at [facility]. CXRAy negative.  Positive TST in 2007 (14mm).  Unable to tolerate INH due to hepatotoxicity   Primary insomnia 06/05/2012   Skin macule 04/21/2019   Tinnitus 07/07/2019   URI (upper respiratory infection) 11/01/2010   Qualifier: Diagnosis of  By: Daphine Deutscher FNP, Zena Amos      Past Surgical History:  Procedure Laterality Date   BREAST CYST EXCISION Right 2011   BREAST EXCISIONAL BIOPSY     benign   BREAST EXCISIONAL BIOPSY Right 1990   benign   INDUCED ABORTION  2005   VAGINAL HYSTERECTOMY  1983   partial; for abnormal bleeding    Current Medications: No outpatient medications have been marked as taking for the 09/12/23 encounter (  Appointment) with Christell Constant, MD.   Current Facility-Administered Medications for the 09/12/23 encounter (Appointment) with Christell Constant, MD  Medication   diclofenac Sodium (VOLTAREN) 1 % topical gel 4 g     Allergies:   Dicyclomine, Hydrocodone, Lipitor [atorvastatin], Penicillins, and Levsin [hyoscyamine]   Social History   Socioeconomic History   Marital status: Divorced    Spouse name: Not on file   Number of children: 2   Years of education: Not on file   Highest education level: Some college, no degree  Occupational History   Occupation: retired  Tobacco Use    Smoking status: Former    Current packs/day: 0.00    Average packs/day: 0.3 packs/day for 43.0 years (14.2 ttl pk-yrs)    Types: Cigarettes    Start date: 01/06/1970    Quit date: 01/06/2013    Years since quitting: 10.6   Smokeless tobacco: Never  Vaping Use   Vaping status: Never Used  Substance and Sexual Activity   Alcohol use: No    Alcohol/week: 0.0 standard drinks of alcohol   Drug use: Not Currently    Comment: x 26 yrs.   Sexual activity: Not on file    Comment: S/P PARTIAL HYSTERECTOMY  Other Topics Concern   Not on file  Social History Narrative   Right handed   Drinks caffeine prn   Lives alone   Retired   One floor home   Social Drivers of Health   Financial Resource Strain: Low Risk  (03/05/2023)   Overall Financial Resource Strain (CARDIA)    Difficulty of Paying Living Expenses: Not very hard  Food Insecurity: No Food Insecurity (03/05/2023)   Hunger Vital Sign    Worried About Running Out of Food in the Last Year: Never true    Ran Out of Food in the Last Year: Never true  Transportation Needs: No Transportation Needs (03/05/2023)   PRAPARE - Administrator, Civil Service (Medical): No    Lack of Transportation (Non-Medical): No  Physical Activity: Insufficiently Active (03/05/2023)   Exercise Vital Sign    Days of Exercise per Week: 2 days    Minutes of Exercise per Session: 30 min  Stress: No Stress Concern Present (03/05/2023)   [employer] of Occupational Health - Occupational Stress Questionnaire    Feeling of Stress : Only a little  Social Connections: Unknown (03/05/2023)   Social Connection and Isolation Panel [NHANES]    Frequency of Communication with Friends and Family: Patient declined    Frequency of Social Gatherings with Friends and Family: Patient declined    Attends Religious Services: More than 4 times per year    Active Member of [employer] or Organizations: Yes    Attends Engineer, structural: More than 4 times per  year    Marital Status: Divorced     Family History: The patient's family history includes Aneurysm in her brother; Arthritis in her brother, mother, and sister; Breast cancer in her sister and sister; Diabetes in her father, mother, and sister; Heart disease in her father and mother; Hyperlipidemia in her father; Hypertension in her father, maternal aunt, maternal uncle, mother, paternal aunt, paternal uncle, sister, sister, and sister; Kidney disease in her father, paternal aunt, and paternal uncle; Mental illness in her brother; Stomach cancer in her maternal uncle; Stroke in her father. There is no history of Colon cancer, Esophageal cancer, or Pancreatic cancer.  ROS:   Please see the history of present illness.  EKGs/Labs/Other Studies Reviewed:    The following studies were reviewed today:   Cardiac Studies & Procedures      ECHOCARDIOGRAM  ECHOCARDIOGRAM COMPLETE 01/09/2023  Narrative ECHOCARDIOGRAM REPORT    Patient Name:   ARMENTA DOW Date of Exam: 01/09/2023 Medical Rec #:  [id number]       Height:       63.0 in Accession #:    [id number]      Weight:       143.1 lb Date of Birth:  Jul 22, 1956      BSA:          1.677 m Patient Age:    66 years        BP:           119/63 mmHg Patient Gender: F               HR:           64 bpm. Exam Location:  Inpatient  Procedure: 2D Echo, Cardiac Doppler, Color Doppler and 3D Echo  Indications:    Chest pain  History:        Patient has prior history of Echocardiogram examinations, most recent 12/20/2020. COPD, Signs/Symptoms:Chest Pain; Risk Factors:Dyslipidemia.  Sonographer:    Milda Smart Referring Phys: [id number] Angie Fava   Sonographer Comments: Image acquisition challenging due to respiratory motion. IMPRESSIONS   1. Left ventricular ejection fraction, by estimation, is 55 to 60%. Left ventricular ejection fraction by 3D volume is 57 %. The left ventricle has normal function. The left ventricle has  no regional wall motion abnormalities. Left ventricular diastolic parameters were normal. 2. Right ventricular systolic function is normal. The right ventricular size is normal. There is normal pulmonary artery systolic pressure. The estimated right ventricular systolic pressure is 25.1 mmHg. 3. The mitral valve is normal in structure. Mild mitral valve regurgitation. No evidence of mitral stenosis. 4. The aortic valve is tricuspid. Aortic valve regurgitation is not visualized. Aortic valve sclerosis is present, with no evidence of aortic valve stenosis. 5. The inferior vena cava is normal in size with greater than 50% respiratory variability, suggesting right atrial pressure of 3 mmHg. 6. Possible trivial circumferential pericardial effusion is present.  FINDINGS Left Ventricle: Left ventricular ejection fraction, by estimation, is 55 to 60%. Left ventricular ejection fraction by 3D volume is 57 %. The left ventricle has normal function. The left ventricle has no regional wall motion abnormalities. The left ventricular internal cavity size was normal in size. There is no left ventricular hypertrophy. Left ventricular diastolic parameters were normal. Normal left ventricular filling pressure.  Right Ventricle: The right ventricular size is normal. No increase in right ventricular wall thickness. Right ventricular systolic function is normal. There is normal pulmonary artery systolic pressure. The tricuspid regurgitant velocity is 2.35 m/s, and with an assumed right atrial pressure of 3 mmHg, the estimated right ventricular systolic pressure is 25.1 mmHg.  Left Atrium: Left atrial size was normal in size.  Right Atrium: Right atrial size was normal in size.  Pericardium: Trivial pericardial effusion is present. The pericardial effusion is circumferential.  Mitral Valve: The mitral valve is normal in structure. Mild mitral valve regurgitation. No evidence of mitral valve stenosis. MV peak  gradient, 5.3 mmHg. The mean mitral valve gradient is 3.0 mmHg.  Tricuspid Valve: The tricuspid valve is normal in structure. Tricuspid valve regurgitation is mild . No evidence of tricuspid stenosis.  Aortic Valve: The aortic valve is tricuspid. Aortic valve regurgitation  is not visualized. Aortic valve sclerosis is present, with no evidence of aortic valve stenosis.  Pulmonic Valve: The pulmonic valve was normal in structure. Pulmonic valve regurgitation is mild. No evidence of pulmonic stenosis.  Aorta: The aortic root is normal in size and structure.  Venous: The inferior vena cava is normal in size with greater than 50% respiratory variability, suggesting right atrial pressure of 3 mmHg.  IAS/Shunts: No atrial level shunt detected by color flow Doppler.   LEFT VENTRICLE PLAX 2D LVIDd:         3.70 cm         Diastology LVIDs:         2.40 cm         LV e' medial:    7.72 cm/s LV PW:         0.80 cm         LV E/e' medial:  13.1 LV IVS:        0.90 cm         LV e' lateral:   9.36 cm/s LVOT diam:     2.20 cm         LV E/e' lateral: 10.8 LV SV:         104 LV SV Index:   62 LVOT Area:     3.80 cm        3D Volume EF LV 3D EF:    Left ventricul ar ejection fraction by 3D volume is 57 %.  3D Volume EF: 3D EF:        57 % LV EDV:       98 ml LV ESV:       42 ml LV SV:        56 ml  RIGHT VENTRICLE             IVC RV S prime:     12.40 cm/s  IVC diam: 1.80 cm TAPSE (M-mode): 1.9 cm  LEFT ATRIUM             Index        RIGHT ATRIUM           Index LA diam:        2.90 cm 1.73 cm/m   RA Area:     10.80 cm LA Vol (A2C):   36.1 ml 21.52 ml/m  RA Volume:   23.00 ml  13.71 ml/m LA Vol (A4C):   25.6 ml 15.26 ml/m LA Biplane Vol: 30.8 ml 18.36 ml/m AORTIC VALVE             PULMONIC VALVE LVOT Vmax:   145.00 cm/s PR End Diast Vel: 3.11 msec LVOT Vmean:  86.500 cm/s LVOT VTI:    0.273 m  AORTA Ao Root diam: 3.40 cm Ao Asc diam:  3.20 cm  MITRAL VALVE                   TRICUSPID VALVE MV Area (PHT): 3.24 cm       TR Peak grad:   22.1 mmHg MV Area VTI:   2.72 cm       TR Mean grad:   17.0 mmHg MV Peak grad:  5.3 mmHg       TR Vmax:        235.00 cm/s MV Mean grad:  3.0 mmHg       TR Vmean:       198.0 cm/s MV Vmax:       1.15 m/s MV  Vmean:      76.6 cm/s      SHUNTS MV Decel Time: 234 msec       Systemic VTI:  0.27 m MR Peak grad:    105.7 mmHg   Systemic Diam: 2.20 cm MR Mean grad:    77.0 mmHg MR Vmax:         514.00 cm/s MR Vmean:        425.5 cm/s MR PISA:         1.01 cm MR PISA Eff ROA: 8 mm MR PISA Radius:  0.40 cm MV E velocity: 101.00 cm/s MV A velocity: 78.60 cm/s MV E/A ratio:  1.28  Armanda Magic MD Electronically signed by Armanda Magic MD Signature Date/Time: 01/09/2023/12:22:20 PM    Final   MONITORS  LONG TERM MONITOR (3-14 DAYS) 12/05/2022  Narrative Patch Wear Time:  2 days and 21 hours (2024-03-12T11:20:45-0400 to 2024-03-15T09:07:02-399)  Patient had a min HR of 51 bpm, max HR of 154 bpm, and avg HR of 82 bpm. Predominant underlying rhythm was Sinus Rhythm.  EVENTS: -2 Supraventricular Tachycardia runs occurred, the run with the fastest interval lasting 8 beats with a max rate of 154 bpm (avg 137 bpm); the run with the fastest interval was also the longest.  -Isolated SVEs were occasional (1.2%, 3496), SVE Couplets were rare (<1.0%, 1), and SVE Triplets were rare (<1.0%, 1). Isolated VEs were rare (<1.0%), and no VE Couplets or VE Triplets were present.  No atrial fibrillation, sustained ventricular tachyarrhythmias, or bradyarrhythmias were detected.  Patient-triggered events corresponded with sinus rhythm and PACs.  CT SCANS  CT CORONARY MORPH W/CTA COR W/SCORE 12/07/2020  Addendum 12/08/2020  3:23 PM ADDENDUM REPORT: 12/08/2020 15:21  CLINICAL DATA:  67 year old female with family history of early CAD and chest pain.  EXAM: Cardiac/Coronary  CTA  TECHNIQUE: The patient was scanned on a Siemens  [employer]'s.  FINDINGS: A 100 kV prospective scan was triggered in the descending thoracic aorta at 111 HU's. Axial non-contrast 3 mm slices were carried out through the heart. The data set was analyzed on a dedicated work station and scored using the Agatson method. Gantry rotation speed was 250 msecs and collimation was .6 mm. 25 mg of PO Metoprolol and 0.8 mg of sl NTG were given. The 3D data set was reconstructed in 5% intervals of the 67-82 % of the R-R cycle. Diastolic phases were analyzed on a dedicated work station using MPR, MIP and VRT modes. The patient received 80 cc of contrast.  Aorta: Normal size. Mild diffuse atherosclerotic plaque. No dissection.  Aortic Valve: Trileaflet.  Trivial calcifications.  Coronary Arteries:  Normal coronary origin.  Right dominance.  RCA is a very large dominant artery that gives rise to PDA and PLA. There is mild calcified atherosclerotic plaque in the proximal RCA with stenosis <25%, mid and distal RVA have minimal stenoses.  PDA and PLA have no significant plaque.  Left main is a large artery that gives rise to LAD and LCX arteries. Left main has no plaque.  LAD is a large vessel that gives rise to one large diagonal artery and has no plaque.  D1 has no plaque.  LCX is a non-dominant artery that gives rise to one large OM1 branch. There is no plaque.  Other findings:  Normal pulmonary vein drainage into the left atrium.  Normal left atrial appendage without a thrombus.  Normal size of the pulmonary artery.  IMPRESSION: 1. Coronary calcium score of 90.  This was 17 percentile for age and sex matched control.  2. Normal coronary origin with right dominance.  3. CAD-RADS 1. Minimal non-obstructive CAD (0-24%). Consider non-atherosclerotic causes of chest pain. Consider preventive therapy and risk factor modification.   Electronically Signed By: Tobias Alexander On: 12/08/2020  15:21  Narrative EXAM: OVER-READ INTERPRETATION  CT CHEST  The following report is an over-read performed by radiologist Dr. Trudie Reed of [facility] on 12/07/2020. This over-read does not include interpretation of cardiac or coronary anatomy or pathology. The coronary calcium score/coronary CTA interpretation by the cardiologist is attached.  COMPARISON:  None.  FINDINGS: Atherosclerotic calcifications in the thoracic aorta. Within the visualized portions of the thorax there are no suspicious appearing pulmonary nodules or masses, there is no acute consolidative airspace disease, no pleural effusions, no pneumothorax and no lymphadenopathy. Visualized portions of the upper abdomen are unremarkable. There are no aggressive appearing lytic or blastic lesions noted in the visualized portions of the skeleton.  IMPRESSION: 1.  Aortic Atherosclerosis (ICD10-I70.0).  Electronically Signed: By: Trudie Reed M.D. On: 12/07/2020 12:40           EKG:   No new tracing today  Recent Labs: 01/07/2023: B Natriuretic Peptide 14.5 07/27/2023: ALT 17; BUN 11; Creatinine, Ser 0.89; Hemoglobin 12.6; Magnesium 2.2; Platelets 246; Potassium 4.1; Sodium 136 08/29/2023: TSH 4.36  Recent Lipid Panel    Component Value Date/Time   CHOL 241 (H) 06/24/2023 0858   TRIG 43 06/24/2023 0858   HDL 129 06/24/2023 0858   CHOLHDL 1.9 06/24/2023 0858   CHOLHDL 2.2 04/14/2015 1642   VLDL 52 (H) 04/14/2015 1642   LDLCALC 105 (H) 06/24/2023 0858     Risk Assessment/Calculations:       Physical Exam:    VS:  There were no vitals taken for this visit.    Wt Readings from Last 3 Encounters:  08/29/23 145 lb (65.8 kg)  08/12/23 148 lb 14.4 oz (67.5 kg)  08/05/23 146 lb 6.4 oz (66.4 kg)     GEN:  Comfortable, NAD HEENT: Normal NECK: No JVD; No carotid bruits CARDIAC: RRR, no murmurs RESPIRATORY:  CTAB, good air movement  ABDOMEN: Soft, non-tender,  non-distended MUSCULOSKELETAL:  No edema; No deformity  SKIN: Warm and dry NEUROLOGIC:  Alert and oriented x 3 PSYCHIATRIC:  Normal affect   ASSESSMENT:    No diagnosis found.  PLAN:    In order of problems listed above:  #Exertional Chest Pressure #DOE: Reassuring cardiac work-up with only mild RCA disease on coronary CTA with Ca score 90 (83% age, sex matched controls). TTE 12/2020 with normal BiV function with no valve disease. Likely related to underlying asthma. -Will refer to Pulm per patient preference -Reassuring cardiac work-up  #Palpitations: -Cardiac monitor with 1.2% PACs but no sustained arrhythmias -Reassured her about her monitor results but continues to have intermittent symptoms -Suspect palpitations are worsening in the setting of increased albuterol use -Will give dilt 15mg  BID prn for symptoms; had fatigue with metop in the past  #Mild Nonobstructive CAD: #HLD: #Aortic Atherosclerosis #Statin Intolerance: Ca score 90 (83%) with mild RCA disease. Unable to tolerate crestor, lipitor and simvastatin. Goal LDL <70. Declined PCSK9i as does not want to do injections. -Continue zetia 10mg  daily -Discussed lifestyle modifications as detailed below -Repeat lipids in 3 months  #Asthma: -Having more significant symptoms in the summer months and needing to use rescue inhaler more frequently -Refer to pulm per patient preference  Exercise recommendations: Goal of  exercising for at least 30 minutes a day, at least 5 times per week.  Please exercise to a moderate exertion.  This means that while exercising it is difficult to speak in full sentences, however you are not so short of breath that you feel you must stop, and not so comfortable that you can carry on a full conversation.  Exertion level should be approximately a 5/10, if 10 is the most exertion you can perform.  Diet recommendations: Recommend a heart healthy diet such as the Mediterranean diet.  This diet  consists of plant based foods, healthy fats, lean meats, olive oil.  It suggests limiting the intake of simple carbohydrates such as white breads, pastries, and pastas.  It also limits the amount of red meat, wine, and dairy products such as cheese that one should consume on a daily basis.   Follow-up in 6 months  Medication Adjustments/Labs and Tests Ordered: Current medicines are reviewed at length with the patient today.  Concerns regarding medicines are outlined above.  No orders of the defined types were placed in this encounter.  No orders of the defined types were placed in this encounter.   There are no Patient Instructions on file for this visit.     Signed, Christell Constant, MD  09/12/2023 8:01 AM    [facility]

## 2023-09-13 ENCOUNTER — Encounter: Payer: Self-pay | Admitting: Internal Medicine

## 2023-09-13 ENCOUNTER — Ambulatory Visit
Admission: RE | Admit: 2023-09-13 | Discharge: 2023-09-13 | Disposition: A | Discharge status: Home / Self Care | Payer: 59 | Source: Ambulatory Visit | Attending: Physician Assistant | Admitting: Physician Assistant

## 2023-09-13 ENCOUNTER — Telehealth: Payer: Self-pay | Admitting: Pharmacist

## 2023-09-13 DIAGNOSIS — E785 Hyperlipidemia, unspecified: Secondary | ICD-10-CM

## 2023-09-13 NOTE — Addendum Note (Signed)
Addended by: Tylene Fantasia on: 09/13/2023 10:18 AM   Modules accepted: Orders

## 2023-09-13 NOTE — Telephone Encounter (Signed)
Call to f/u on Crestor, N/A LVM

## 2023-09-13 NOTE — Telephone Encounter (Addendum)
 Spoke to patient, has been tolerating Crestor 10 mg every other day and Zetia 10 mg daily. Will repeat lab on December 10, 2023.

## 2023-09-15 NOTE — Progress Notes (Signed)
The MRI of the brain  showed very mild hardening of the small blood vessels likely age related , otherwise unremarkable  It did not show any tumors, stroke or bleed. No other orders, no antidementia medication at this time. Thanks

## 2023-09-23 ENCOUNTER — Telehealth: Payer: Self-pay | Admitting: Physician Assistant

## 2023-09-23 NOTE — Telephone Encounter (Signed)
Pt called in returning a call about results 

## 2023-09-24 NOTE — Telephone Encounter (Signed)
 I advised patient of results of MRI of brain, verbalized understanding and will follow up as scheduled in March 2025.

## 2023-09-24 NOTE — Telephone Encounter (Signed)
 No answer at 8:24 09/24/2023

## 2023-09-30 ENCOUNTER — Other Ambulatory Visit: Payer: Self-pay

## 2023-09-30 MED ORDER — EZETIMIBE 10 MG PO TABS: 10.0000 mg | ORAL_TABLET | Freq: Every day | ORAL | 1 refills | Status: DC

## 2023-10-31 ENCOUNTER — Telehealth: Payer: Self-pay | Admitting: Student

## 2023-10-31 ENCOUNTER — Ambulatory Visit: Payer: 59 | Admitting: Student

## 2023-10-31 ENCOUNTER — Encounter: Payer: Self-pay | Admitting: Student

## 2023-10-31 ENCOUNTER — Telehealth: Payer: Self-pay | Admitting: *Deleted

## 2023-10-31 VITALS — BP 128/71 | HR 103 | Temp 102.5°F | Ht 63.0 in | Wt 149.0 lb

## 2023-10-31 DIAGNOSIS — R300 Dysuria: Secondary | ICD-10-CM | POA: Diagnosis not present

## 2023-10-31 DIAGNOSIS — J069 Acute upper respiratory infection, unspecified: Secondary | ICD-10-CM

## 2023-10-31 LAB — GLUCOSE, CAPILLARY: Glucose-Capillary: 120 mg/dL — ABNORMAL HIGH (ref 70–99)

## 2023-10-31 LAB — POCT URINALYSIS DIPSTICK
Blood, UA: NEGATIVE
Glucose, UA: NEGATIVE
Nitrite, UA: NEGATIVE
Protein, UA: NEGATIVE
Spec Grav, UA: 1.025 (ref 1.010–1.025)
Urobilinogen, UA: 0.2 U/dL
pH, UA: 5.5 (ref 5.0–8.0)

## 2023-10-31 LAB — RESP PANEL BY RT-PCR (RSV, FLU A&B, COVID)  RVPGX2
Influenza A by PCR: POSITIVE — AB
Influenza B by PCR: NEGATIVE
Resp Syncytial Virus by PCR: NEGATIVE
SARS Coronavirus 2 by RT PCR: NEGATIVE

## 2023-10-31 MED ORDER — ACETAMINOPHEN 500 MG PO TABS: 1000.0000 mg | ORAL_TABLET | Freq: Once | ORAL | Status: DC

## 2023-10-31 NOTE — Telephone Encounter (Signed)
Call from  patient c/o of not feeling well for a few days.  Felt better yesterday.  Now feels tired.  Dry cough per patient but dies cough up Beige mucous.  Has been having chills.  Unsure of fevers.  Given an appointment for today at 3:15 PM.

## 2023-10-31 NOTE — Patient Instructions (Addendum)
Thank you, Ms.Annali C Dreibelbis for allowing [place] to provide your care today. Today we discussed your respiratory illness and dysuria.  As we discussed, you likely have a viral upper respiratory infection.  We have collected a swab to test for some common viral illnesses and we will call you with results.  We are also collecting a few labs including a CBC and BMP.  We have also collected a urinalysis to evaluate for urinary tract infection.  I will call you soon as I have these results.  You may continue to take Tylenol and Robitussin as needed.  I recommend taking Tylenol regularly for your fever and bodyaches.  You may take Tylenol every 4-6 hours as needed.  The maximum daily dose of Tylenol is 3 g.   If you experience any severe worsening of your symptoms, or have shortness of breath, lightheadedness or dizziness, please go to the emergency department for further evaluation.  I have ordered the following labs for you:  Lab Orders         Resp panel by RT-PCR (RSV, Flu A&B, Covid) Anterior Nasal Swab         CBC with Diff         BMP8+Anion Gap         POCT Urinalysis Dipstick (81002)      Follow up: 1 month  We look forward to seeing you next time. Please call our clinic at [phone] if you have any questions or concerns. The best time to call is Monday-Friday from 9am-4pm, but there is someone available 24/7. If after hours or the weekend, call the main hospital number and ask for the Internal Medicine Resident On-Call. If you need medication refills, please notify your pharmacy one week in advance and they will send [place] a request.   Thank you for trusting me with your care. Wishing you the best!   Annett Fabian, MD [facility]

## 2023-10-31 NOTE — Assessment & Plan Note (Signed)
She reports 1 week of dysuria and discoloration of her urine.  We tried to collect a urinalysis today however were unsuccessful in getting a large enough sample from the patient.  We did a urine dipstick which showed trace leukocytes, but did not demonstrate any bacteria or nitrites.  For now, we will focus on her respiratory illness and follow-up on the symptoms at her close follow-up appointment in 4 weeks.  At that time, if she is still having symptoms, we can recheck a urinalysis.

## 2023-10-31 NOTE — Assessment & Plan Note (Addendum)
She has had 2 days of fever, constant coughing with yellowish mucus production, and subjective dyspnea.  She is hemodynamically stable, but she has a fever of 102.5 and is tachycardic with a heart rate of 103 in clinic today.  She is satting well on room air and does not appear to have any increased work of breathing on exam.  Her lung exam was unremarkable.  She denies any GI symptoms.    Her symptoms are consistent with a viral upper respiratory infection, so I will collect a respiratory swab today.  We have given her 1000 mg of Tylenol in clinic today.  I have encouraged her to take Robitussin and Tylenol at home as needed.  She should avoid contact with others for the next few days while she is contagious and continue to mask.  We have also collected a CBC and BMP in clinic.  I expect her symptoms to self resolve with supportive care over the next week or two.

## 2023-10-31 NOTE — Telephone Encounter (Signed)
Opened in error

## 2023-10-31 NOTE — Progress Notes (Signed)
CC: Not feeling well  HPI:  [name] is a 68 y.o. female living with a history stated below and presents today for an acute visit. Please see problem based assessment and plan for additional details.  Past Medical History:  Diagnosis Date   Abdominal pain 08/16/2008   Qualifier: Diagnosis of  By: Aimee Deutscher FNP, Aimee Davis     Abdominal pain, epigastric 09/08/2019   Allergy    seasonal, PCN, antidepressants, bentyl   Aortic atherosclerosis (HCC) 02/23/2021   Arthritis    Asthma    Asymptomatic bacteriuria 08/24/2021   UA showed leukocytes but patient does not have dysuria, fevers or hematuria. She also had bacteruria in her last UA. Her symptoms of fatigue, and loss of appetite are non-specific but if persist or she begins having dysuria we can work up further. A UTI would not explain the abdominal pain patient is having.    Carpal tunnel syndrome on both sides    right worse than left 2008   Cataract 06/11/2013   COPD (chronic obstructive pulmonary disease) (HCC)    COVID-19 virus infection 06/18/2016   Eczema 05/02/2018   Food poisoning 04/04/2021   GERD (gastroesophageal reflux disease)    Hair loss 07/07/2019   Hyperlipidemia    LDL 108 JULY 2013   IBS (irritable bowel syndrome)    Macular rash 05/20/2019   Multiple nevi 03/01/2015   Pneumonia    Positive TB test    From MArch 2009 note : Recent F/u at [facility]. CXRAy negative.  Positive TST in 2007 (14mm).  Unable to tolerate INH due to hepatotoxicity   Primary insomnia 06/05/2012   Skin macule 04/21/2019   Tinnitus 07/07/2019   URI (upper respiratory infection) 11/01/2010   Qualifier: Diagnosis of  By: Aimee Deutscher FNP, Aimee Davis      Current Outpatient Medications on File Prior to Visit  Medication Sig Dispense Refill   albuterol (VENTOLIN HFA) 108 (90 Base) MCG/ACT inhaler Inhale 1 puff into the lungs every 6 (six) hours as needed for wheezing or shortness of breath. USE 2 INHALATIONS BY MOUTH EVERY 6 HOURS AS NEEDED FOR WHEEZING   OR SHORTNESS OF BREATH 26.8 g 2   Artificial Tear Ointment (DRY EYES OP) Apply 1 drop to eye daily as needed (dry eyes).     Ascorbic Acid (VITAMIN C) 100 MG tablet Take 50 mg by mouth daily.     aspirin EC 81 MG tablet Take 1 tablet (81 mg total) by mouth daily. Swallow whole. 90 tablet 3   Budeson-Glycopyrrol-Formoterol (BREZTRI AEROSPHERE) 160-9-4.8 MCG/ACT AERO Inhale 2 puffs into the lungs in the morning and at bedtime.     carboxymethylcellul-glycerin (OPTIVE) 0.5-0.9 % ophthalmic solution 1 drop 4 (four) times daily as needed for dry eyes.     cholecalciferol (VITAMIN D3) 25 MCG (1000 UNIT) tablet Take 1 tablet (1,000 Units total) by mouth daily. 60 tablet 1   diltiazem (CARDIZEM) 30 MG tablet Take 1 tablet (30 mg total) by mouth 2 (two) times daily as needed (For palpitations). 30 tablet 6   ezetimibe (ZETIA) 10 MG tablet Take 1 tablet (10 mg total) by mouth daily. 90 tablet 1   fexofenadine (ALLEGRA) 180 MG tablet Take 1 tablet (180 mg total) by mouth daily. 90 tablet 1   Fluticasone-Umeclidin-Vilant (TRELEGY ELLIPTA) 200-62.5-25 MCG/ACT AEPB USE 1 INHALATION BY MOUTH ONCE  DAILY AT THE SAME TIME EACH DAY 180 each 3   mometasone (NASONEX) 50 MCG/ACT nasal spray PLACE 2 SPRAYS IN EACH NOSTRIL ONCE  DAILY AS NEEDED. (Patient taking differently: Place 2 sprays into the nose daily.) 17 g 6   Multiple Vitamins-Minerals (MULTIVITAMIN WITH MINERALS) tablet Take 1 tablet by mouth daily.     pantoprazole (PROTONIX) 40 MG tablet Take 1 tablet (40 mg total) by mouth 2 (two) times daily before a meal for 15 days. 30 tablet 0   rosuvastatin (CRESTOR) 20 MG tablet Take 1 tablet (20 mg total) by mouth daily. 90 tablet 3   Current Facility-Administered Medications on File Prior to Visit  Medication Dose Route Frequency Provider Last Rate Last Admin   diclofenac Sodium (VOLTAREN) 1 % topical gel 4 g  4 g Topical Daily PRN         Family History  Problem Relation Age of Onset   Hypertension Mother     Heart disease Mother    Diabetes Mother    Arthritis Mother    Stroke Father    Diabetes Father    Heart disease Father    Hyperlipidemia Father    Hypertension Father    Kidney disease Father    Hypertension Sister    Breast cancer Sister    Breast cancer Sister    Hypertension Sister    Arthritis Sister    Hypertension Sister    Diabetes Sister    Mental illness Brother    Aneurysm Brother        brain   Arthritis Brother    Hypertension Maternal Aunt    Hypertension Maternal Uncle    Stomach cancer Maternal Uncle    Hypertension Paternal Aunt    Kidney disease Paternal Aunt        x 2   Hypertension Paternal Uncle    Kidney disease Paternal Uncle        x 2   Colon cancer Neg Hx    Esophageal cancer Neg Hx    Pancreatic cancer Neg Hx    Social History   Socioeconomic History   Marital status: Divorced    Spouse name: Not on file   Number of children: 2   Years of education: Not on file   Highest education level: Some college, no degree  Occupational History   Occupation: retired  Tobacco Use   Smoking status: Former    Current packs/day: 0.00    Average packs/day: 0.3 packs/day for 43.0 years (14.2 ttl pk-yrs)    Types: Cigarettes    Start date: 01/06/1970    Quit date: 01/06/2013    Years since quitting: 10.8   Smokeless tobacco: Never  Vaping Use   Vaping status: Never Used  Substance and Sexual Activity   Alcohol use: No    Alcohol/week: 0.0 standard drinks of alcohol   Drug use: Not Currently    Comment: x 26 yrs.   Sexual activity: Not on file    Comment: S/P PARTIAL HYSTERECTOMY  Other Topics Concern   Not on file  Social History Narrative   Right handed   Drinks caffeine prn   Lives alone   Retired   One floor home   Social Drivers of Health   Financial Resource Strain: Low Risk  (03/05/2023)   Overall Financial Resource Strain (CARDIA)    Difficulty of Paying Living Expenses: Not very hard  Food Insecurity: No Food Insecurity  (03/05/2023)   Hunger Vital Sign    Worried About Running Out of Food in the Last Year: Never true    Ran Out of Food in the Last Year: Never true  Transportation  Needs: No Transportation Needs (03/05/2023)   PRAPARE - Administrator, Civil Service (Medical): No    Lack of Transportation (Non-Medical): No  Physical Activity: Insufficiently Active (03/05/2023)   Exercise Vital Sign    Days of Exercise per Week: 2 days    Minutes of Exercise per Session: 30 min  Stress: No Stress Concern Present (03/05/2023)   [employer] of Occupational Health - Occupational Stress Questionnaire    Feeling of Stress : Only a little  Social Connections: Unknown (03/05/2023)   Social Connection and Isolation Panel [NHANES]    Frequency of Communication with Friends and Family: Patient declined    Frequency of Social Gatherings with Friends and Family: Patient declined    Attends Religious Services: More than 4 times per year    Active Member of [employer] or Organizations: Yes    Attends Banker Meetings: More than 4 times per year    Marital Status: Divorced  Intimate Partner Violence: Patient Declined (03/05/2023)   Humiliation, Afraid, Rape, and Kick questionnaire    Fear of Current or Ex-Partner: Patient declined    Emotionally Abused: Patient declined    Physically Abused: Patient declined    Sexually Abused: Patient declined   Review of Systems: ROS negative except for what is noted on the assessment and plan.  Vitals:   10/31/23 1525  BP: 128/71  Pulse: (!) 103  Temp: (!) 102.5 F (39.2 C)  TempSrc: Oral  SpO2: 95%  Weight: 149 lb (67.6 kg)  Height: 5\' 3"  (1.6 m)   Physical Exam: Ill-appearing older woman Mild sinus pressure and congestion Lungs clear to auscultation, no wheezes, rales, or rhonchi; intermittent wet cough; normal work and rate of breathing Tachycardic, normal rhythm, no murmurs Abdomen soft, nondistended Alert and oriented, no focal neurological  deficits  Assessment & Plan:   Patient discussed with Dr. Antony Contras  Viral upper respiratory infection She has had 2 days of fever, constant coughing with yellowish mucus production, and subjective dyspnea.  She is hemodynamically stable, but she has a fever of 102.5 and is tachycardic with a heart rate of 103 in clinic today.  She is satting well on room air and does not appear to have any increased work of breathing on exam.  Her lung exam was unremarkable.  She denies any GI symptoms.    Her symptoms are consistent with a viral upper respiratory infection, so I will collect a respiratory swab today.  We have given her 1000 mg of Tylenol in clinic today.  I have encouraged her to take Robitussin and Tylenol at home as needed.  She should avoid contact with others for the next few days while she is contagious and continue to mask.  We have also collected a CBC and BMP in clinic.  I expect her symptoms to self resolve with supportive care over the next week or two.  Dysuria She reports 1 week of dysuria and discoloration of her urine.  We tried to collect a urinalysis today however were unsuccessful in getting a large enough sample from the patient.  We did a urine dipstick which showed trace leukocytes, but did not demonstrate any bacteria or nitrites.  For now, we will focus on her respiratory illness and follow-up on the symptoms at her close follow-up appointment in 4 weeks.  At that time, if she is still having symptoms, we can recheck a urinalysis.  Annett Fabian, MD [facility] Internal Medicine, PGY-1 Phone: [phone] Date 10/31/2023 Time 5:05  PM

## 2023-11-01 ENCOUNTER — Other Ambulatory Visit: Payer: Self-pay | Admitting: Student

## 2023-11-01 LAB — CBC WITH DIFFERENTIAL/PLATELET
Basophils Absolute: 0 10*3/uL (ref 0.0–0.2)
Basos: 1 %
EOS (ABSOLUTE): 0 10*3/uL (ref 0.0–0.4)
Eos: 0 %
Hematocrit: 40.8 % (ref 34.0–46.6)
Hemoglobin: 13 g/dL (ref 11.1–15.9)
Immature Grans (Abs): 0 10*3/uL (ref 0.0–0.1)
Immature Granulocytes: 0 %
Lymphocytes Absolute: 0.5 10*3/uL — ABNORMAL LOW (ref 0.7–3.1)
Lymphs: 12 %
MCH: 27.2 pg (ref 26.6–33.0)
MCHC: 31.9 g/dL (ref 31.5–35.7)
MCV: 85 fL (ref 79–97)
Monocytes Absolute: 0.5 10*3/uL (ref 0.1–0.9)
Monocytes: 13 %
Neutrophils Absolute: 3 10*3/uL (ref 1.4–7.0)
Neutrophils: 74 %
Platelets: 204 10*3/uL (ref 150–450)
RBC: 4.78 x10E6/uL (ref 3.77–5.28)
RDW: 12.2 % (ref 11.7–15.4)
WBC: 4.1 10*3/uL (ref 3.4–10.8)

## 2023-11-01 LAB — BMP8+ANION GAP
Anion Gap: 15 mmol/L (ref 10.0–18.0)
BUN/Creatinine Ratio: 16 (ref 12–28)
BUN: 13 mg/dL (ref 8–27)
CO2: 19 mmol/L — ABNORMAL LOW (ref 20–29)
Calcium: 9.2 mg/dL (ref 8.7–10.3)
Chloride: 101 mmol/L (ref 96–106)
Creatinine, Ser: 0.83 mg/dL (ref 0.57–1.00)
Glucose: 103 mg/dL — ABNORMAL HIGH (ref 70–99)
Potassium: 4.1 mmol/L (ref 3.5–5.2)
Sodium: 135 mmol/L (ref 134–144)
eGFR: 76 mL/min/{1.73_m2} (ref >59)

## 2023-11-01 MED ORDER — OSELTAMIVIR PHOSPHATE 75 MG PO CAPS: 75.0000 mg | ORAL_CAPSULE | Freq: Two times a day (BID) | ORAL | 0 refills | Status: AC

## 2023-11-01 NOTE — Progress Notes (Signed)
Internal Medicine Clinic Attending  Case discussed with the resident at the time of the visit.  We reviewed the resident's history and exam and pertinent patient test results.  I agree with the assessment, diagnosis, and plan of care documented in the resident's note.   Flu A positive; labs not consistent with sepsis. Ok for outpatient management of fever and viral illness.

## 2023-11-11 ENCOUNTER — Ambulatory Visit: Payer: 59 | Admitting: Pulmonary Disease

## 2023-11-14 ENCOUNTER — Encounter: Payer: Self-pay | Admitting: Pulmonary Disease

## 2023-11-14 ENCOUNTER — Ambulatory Visit: Payer: 59 | Admitting: Pulmonary Disease

## 2023-11-14 VITALS — BP 124/79 | HR 63 | Temp 97.7°F | Ht 63.5 in | Wt 149.0 lb

## 2023-11-14 DIAGNOSIS — J4489 Other specified chronic obstructive pulmonary disease: Secondary | ICD-10-CM

## 2023-11-14 MED ORDER — BREZTRI AEROSPHERE 160-9-4.8 MCG/ACT IN AERO: 2.0000 | INHALATION_SPRAY | Freq: Two times a day (BID) | RESPIRATORY_TRACT | 11 refills | Status: DC

## 2023-11-14 NOTE — Patient Instructions (Signed)
 Neck to see you again  [facility] prescribed today  Samples as well  Keep an eye on the dose counter on top of the inhaler  If the Aimee Davis is too expensive please send me a message and we will look for an alternative  Return to clinic in 3 months or sooner as needed with Dr. Judeth Horn

## 2023-11-14 NOTE — Progress Notes (Signed)
 @Patient  ID: Aimee Davis, female    DOB: 09/11/1956, 68 y.o.   MRN: [id number]  Chief Complaint  Patient presents with   Follow-up    Patient had flu A 10/31/2023.  Doing much better today - some cough persistent.    Referring provider: Modena Slater, DO  HPI:   68 y.o. woman with history of COPD and asthma whom we are seeing for evaluation of dyspnea on exertion.  Most recent neurology note reviewed.  Most recent internal medicine, PCP note reviewed.  Overall doing well.  Switch Trelegy to Breztri at last visit.  She found intermittently helpful.  Dyspnea improved.  Much easier to breathe at [facility]  She has been using NT inhaler looks like for a few weeks.  Symptoms still well-controlled.  We discussed keep an eye on the dose counter.  Discussed continuing the Breztri if able.  Discussed may be too expensive and we can look for options.  HPI initial visit: She has had worsening dyspnea since 2022.  She notes timeframe starting after COVID infection in 2022.  Most winded going up stairs or inclines.  Or carrying things.  No times a day when things are better or worse.  No position to make things better or worse.  No seasonal or environmental factors she can identify them a seems better or worse.  No time of year when things are better or worse.  Worse year-round.  She notes significant nasal allergies, eye allergies watery eyes etc.  These are somewhat seasonal but persistent throughout the year.  She uses albuterol when needed and finds it to be somewhat effective.  She is been on Trelegy for some time.  High dose.  She feels like it has helped some.  But she still has significant symptoms.  Limiting her ability to get around during the day.  Perform tasks around the house.  She has to frequently stop and sit down and rest.  Most recent chest x-ray 07/2023 is clear lungs and hyperinflated on my review interpretation.  She had a CT scan PE protocol with contrast/2024 that on my review  interpretation shows no PE, clear lungs, significant emphysema changes in the upper lobes with more mild changes in the lower lobes, lungs clear.  TTE 12/2022 largely normal revealed trivial mitral valve regurgitation.  Questionaires / Pulmonary Flowsheets:   ACT:      No data to display          MMRC:     No data to display          Epworth:      No data to display          Tests:   FENO:  No results found for: "NITRICOXIDE"  PFT:    Latest Ref Rng & Units 07/21/2020    9:50 AM  PFT Results  FVC-Pre L 2.07   FVC-Predicted Pre % 83   FVC-Post L 2.29   FVC-Predicted Post % 92   Pre FEV1/FVC % % 57   Post FEV1/FCV % % 59   FEV1-Pre L 1.18   FEV1-Predicted Pre % 61   FEV1-Post L 1.34   DLCO uncorrected ml/min/mmHg 10.35   DLCO UNC% % 53   DLVA Predicted % 69   TLC L 5.00   TLC % Predicted % 100   RV % Predicted % 137   Personally reviewed and interpreted as spirometry consistent with moderate COPD, no significant bronchodilator response, lung volumes consistent with air trapping, otherwise normal.  DLCO is moderately reduced.  WALK:      No data to display          Imaging: Personally reviewed and as per EMR and discussion this note No results found.  Lab Results: Personally reviewed CBC    Component Value Date/Time   WBC 4.1 10/31/2023 1627   WBC 3.2 (L) 07/27/2023 0224   RBC 4.78 10/31/2023 1627   RBC 4.47 07/27/2023 0224   HGB 13.0 10/31/2023 1627   HCT 40.8 10/31/2023 1627   PLT 204 10/31/2023 1627   MCV 85 10/31/2023 1627   MCH 27.2 10/31/2023 1627   MCH 28.2 07/27/2023 0224   MCHC 31.9 10/31/2023 1627   MCHC 33.4 07/27/2023 0224   RDW 12.2 10/31/2023 1627   LYMPHSABS 0.5 (L) 10/31/2023 1627   MONOABS 0.4 07/27/2023 0224   EOSABS 0.0 10/31/2023 1627   BASOSABS 0.0 10/31/2023 1627    BMET    Component Value Date/Time   NA 135 10/31/2023 1627   K 4.1 10/31/2023 1627   CL 101 10/31/2023 1627   CO2 19 (L) 10/31/2023 1627    GLUCOSE 103 (H) 10/31/2023 1627   GLUCOSE 105 (H) 07/27/2023 0224   BUN 13 10/31/2023 1627   CREATININE 0.83 10/31/2023 1627   CREATININE 1.03 (H) 01/15/2019 1400   CREATININE 0.91 11/18/2014 1407   CALCIUM 9.2 10/31/2023 1627   GFRNONAA >60 07/27/2023 0224   GFRNONAA 57 (L) 01/15/2019 1400   GFRNONAA 69 11/18/2014 1407   GFRAA 90 11/07/2020 1510   GFRAA >60 01/15/2019 1400   GFRAA 79 11/18/2014 1407    BNP    Component Value Date/Time   BNP 14.5 01/07/2023 2229    ProBNP No results found for: "PROBNP"  Specialty Problems       Pulmonary Problems   RHINITIS, ALLERGIC   Qualifier: Diagnosis of  By: Bebe Shaggy   Replacing diagnoses that were inactivated after the 12/17/22 regulatory import      COPD (chronic obstructive pulmonary disease) (HCC)   Qualifier: Diagnosis of  By: Levon Hedger        Viral upper respiratory infection   Qualifier: Diagnosis of  By: Daphine Deutscher FNP, [name]        Emphysema lung (HCC)    Allergies  Allergen Reactions   Dicyclomine     confusion   Hydrocodone Other (See Comments)    Recovering addict X 29 years; no narcotics per patient   Lipitor [Atorvastatin] Other (See Comments)    Pt reports causes upset stomach, cramps in legs, poor appetite, and sleep disturbance   Penicillins Other (See Comments)    REACTION: rash   Levsin [Hyoscyamine] Anxiety    Immunization History  Administered Date(s) Administered   Influenza Split 06/05/2012   Influenza Whole 08/04/2009, 06/16/2010   Influenza,inj,Quad PF,6+ Mos 07/14/2013, 08/10/2014, 06/24/2018, 06/16/2019, 06/20/2020   PFIZER(Purple Top)SARS-COV-2 Vaccination 11/27/2019, 12/21/2019, 09/23/2020   Pneumococcal Polysaccharide-23 11/16/2002   Td 12/16/2004   Tdap 04/14/2015    Past Medical History:  Diagnosis Date   Abdominal pain 08/16/2008   Qualifier: Diagnosis of  By: Daphine Deutscher FNP, [name]     Abdominal pain, epigastric 09/08/2019   Allergy    seasonal, PCN,  antidepressants, bentyl   Aortic atherosclerosis (HCC) 02/23/2021   Arthritis    Asthma    Asymptomatic bacteriuria 08/24/2021   UA showed leukocytes but patient does not have dysuria, fevers or hematuria. She also had bacteruria in her last UA. Her symptoms of fatigue, and loss of appetite are  non-specific but if persist or she begins having dysuria we can work up further. A UTI would not explain the abdominal pain patient is having.    Carpal tunnel syndrome on both sides    right worse than left 2008   Cataract 06/11/2013   COPD (chronic obstructive pulmonary disease) (HCC)    COVID-19 virus infection 06/18/2016   Eczema 05/02/2018   Food poisoning 04/04/2021   GERD (gastroesophageal reflux disease)    Hair loss 07/07/2019   Hyperlipidemia    LDL 108 JULY 2013   IBS (irritable bowel syndrome)    Macular rash 05/20/2019   Multiple nevi 03/01/2015   Pneumonia    Positive TB test    From MArch 2009 note : Recent F[facility]u at [facility]. CXRAy negative.  Positive TST in 2007 (14mm).  Unable to tolerate INH due to hepatotoxicity   Primary insomnia 06/05/2012   Skin macule 04/21/2019   Tinnitus 07/07/2019   URI (upper respiratory infection) 11/01/2010   Qualifier: Diagnosis of  By: Daphine Deutscher FNP, Zena Amos      Tobacco History: Social History   Tobacco Use  Smoking Status Former   Current packs/day: 0.00   Average packs/day: 0.3 packs/day for 43.0 years (14.2 ttl pk-yrs)   Types: Cigarettes   Start date: 01/06/1970   Quit date: 01/06/2013   Years since quitting: 10.8  Smokeless Tobacco Never   Counseling given: Not Answered   Continue to not smoke  Outpatient Encounter Medications as of 11/14/2023  Medication Sig   albuterol (VENTOLIN HFA) 108 (90 Base) MCG/ACT inhaler Inhale 1 puff into the lungs every 6 (six) hours as needed for wheezing or shortness of breath. USE 2 INHALATIONS BY MOUTH EVERY 6 HOURS AS NEEDED FOR WHEEZING  OR SHORTNESS OF BREATH   Artificial Tear Ointment (DRY EYES OP) Apply 1  drop to eye daily as needed (dry eyes).   Ascorbic Acid (VITAMIN C) 100 MG tablet Take 50 mg by mouth daily.   aspirin EC 81 MG tablet Take 1 tablet (81 mg total) by mouth daily. Swallow whole.   Budeson-Glycopyrrol-Formoterol (BREZTRI AEROSPHERE) 160-9-4.8 MCG/ACT AERO Inhale 2 puffs into the lungs in the morning and at bedtime.   carboxymethylcellul-glycerin (OPTIVE) 0.5-0.9 % ophthalmic solution 1 drop 4 (four) times daily as needed for dry eyes.   cholecalciferol (VITAMIN D3) 25 MCG (1000 UNIT) tablet Take 1 tablet (1,000 Units total) by mouth daily.   cromolyn (OPTICROM) 4 % ophthalmic solution Place 1 drop into both eyes 2 (two) times daily.   diltiazem (CARDIZEM) 30 MG tablet Take 1 tablet (30 mg total) by mouth 2 (two) times daily as needed (For palpitations).   ezetimibe (ZETIA) 10 MG tablet Take 1 tablet (10 mg total) by mouth daily.   fexofenadine (ALLEGRA) 180 MG tablet Take 1 tablet (180 mg total) by mouth daily.   mometasone (NASONEX) 50 MCG/ACT nasal spray PLACE 2 SPRAYS IN EACH NOSTRIL ONCE DAILY AS NEEDED. (Patient taking differently: Place 2 sprays into the nose daily.)   Multiple Vitamins-Minerals (MULTIVITAMIN WITH MINERALS) tablet Take 1 tablet by mouth daily.   pantoprazole (PROTONIX) 40 MG tablet Take 1 tablet (40 mg total) by mouth 2 (two) times daily before a meal for 15 days.   rosuvastatin (CRESTOR) 20 MG tablet Take 1 tablet (20 mg total) by mouth daily.   [DISCONTINUED] Budeson-Glycopyrrol-Formoterol (BREZTRI AEROSPHERE) 160-9-4.8 MCG/ACT AERO Inhale 2 puffs into the lungs in the morning and at bedtime.   [DISCONTINUED] Fluticasone-Umeclidin-Vilant (TRELEGY ELLIPTA) 200-62.5-25 MCG/ACT AEPB USE 1  INHALATION BY MOUTH ONCE  DAILY AT THE SAME TIME EACH DAY (Patient not taking: Reported on 11/14/2023)   Facility-Administered Encounter Medications as of 11/14/2023  Medication   acetaminophen (TYLENOL) tablet 1,000 mg   diclofenac Sodium (VOLTAREN) 1 % topical gel 4 g      Review of Systems  Review of Systems  N/a  Physical Exam  BP 124/79 (BP Location: Left Arm, Patient Position: Sitting, Cuff Size: Normal)   Pulse 63   Temp 97.7 F (36.5 C) (Oral)   Ht 5' 3.5" (1.613 m)   Wt 149 lb (67.6 kg)   SpO2 97%   BMI 25.98 kg/m   Wt Readings from Last 5 Encounters:  11/14/23 149 lb (67.6 kg)  10/31/23 149 lb (67.6 kg)  08/29/23 145 lb (65.8 kg)  08/12/23 148 lb 14.4 oz (67.5 kg)  08/05/23 146 lb 6.4 oz (66.4 kg)    BMI Readings from Last 5 Encounters:  11/14/23 25.98 kg/m  10/31/23 26.39 kg/m  08/29/23 25.69 kg/m  08/12/23 26.38 kg/m  08/05/23 25.93 kg/m     Physical Exam General: Sitting in chair, no acute distress Eyes: EOMI, no icterus Neck: Supple, no JVD Pulmonary: Clear, normal work of breathing Cardiovascular: Warm, no edema Abdomen: Nondistended, sounds present MSK: No synovitis with no joint effusion Neuro: Normal gait, no weakness Psych: Normal mood, full affect  Assessment & Plan:   Dyspnea on exertion: Suspect multifactorial related to COPD and air trapping as demonstrated on PFTs in 2021, asthma, possibly worsened after COVID infection given onset of symptoms worsening after infection in 2022, cardiac causes such as mitral valve regurgitation seen on TTE 12/2022.  Suspect lungs as primary driver.  Previously on Trelegy, improved with switch to [employer].  COPD: Moderate on PFTs 07/2020.  On triple inhaled therapy via Trelegy with uncontrolled dyspnea.  Improved with switch from Trelegy to [place].  Consider Ohtuvayre in future and/or pulmonary rehab.  Asthma: Possibly contributing to uncontrolled symptoms of dyspnea.  Onset of worsening after COVID infection would make sense, possible postviral triggering of worsening asthma.  Altering inhalers as above.  Eosinophils not elevated in the past.  Consider IgE, allergen panel to assist in choosing biologic agents in the future if symptoms worsen.   Return in about 3  months (around 02/11/2024) for f/u Dr. Judeth Horn.   Karren Burly, MD 11/14/2023

## 2023-11-27 ENCOUNTER — Telehealth: Payer: Self-pay | Admitting: *Deleted

## 2023-11-27 NOTE — Telephone Encounter (Unsigned)
 Copied from CRM [id number]. Topic: Appointments - Scheduling Inquiry for Clinic >> Nov 27, 2023 10:13 AM Antony Haste wrote: Reason for CRM: The patient is requesting to schedule her Annual Wellness Visit, I attempted to schedule the patient however, it shows the template for each provider is blocked due to visit type modifiers. She is requesting to be schedule anytime in the morning, if possible. Callback #:[phone]

## 2023-11-28 ENCOUNTER — Encounter: Payer: Self-pay | Admitting: Physician Assistant

## 2023-11-28 ENCOUNTER — Ambulatory Visit (INDEPENDENT_AMBULATORY_CARE_PROVIDER_SITE_OTHER): Payer: 59 | Admitting: Physician Assistant

## 2023-11-28 VITALS — BP 114/64 | HR 79 | Resp 20 | Wt 151.0 lb

## 2023-11-28 DIAGNOSIS — R413 Other amnesia: Secondary | ICD-10-CM

## 2023-11-28 NOTE — Progress Notes (Signed)
 Assessment/Plan:   Memory Difficulties  Aimee Davis is a very pleasant 68 y.o. RH female with extensive medical history including history of hypertension, hyperlipidemia, asthma, arthritis, cataracts, COPD-emphysema, GERD, insomnia, anxiety, hypothyroidism  presenting today in follow-up for evaluation of memory loss.  During her first visit, there she reported that she was being evaluated for ADD and sleep apnea as well although she denies that she had any issues.  Suspect etiology is multifactorial although she believes that this was due to long COVID .  Patient is not on antidementia medications, but at this moment this is not indicated.  Today's MMSE is 29/30.  She is able to participate in her ADL, she reports that her mood is good.       Recommendations:   Follow up in 6  months. Patient is scheduled for neuropsych evaluation the near future to further evaluate cognitive concerns and determine other underlying cause of memory changes.    No antidementia medication is indicated at this time I agree with ADD investigation with PCP Follow-up COPD with pulmonary  Recommend good control of cardiovascular risk factors Continue to control mood as per PCP    Subjective:   This patient is here alone. Previous records as well as any outside records available were reviewed prior to todays visit.   Patient was last seen on 08/29/2023 with [facility] 20/30.    Any changes in memory since last visit? " Is good".  Denies difficulty remembering new information, considerations, names.  LTM is good. repeats oneself?  Denies  Disoriented when walking into a room?  Patient denies, except sometimes not remembering what she came to her room for.   Misplacing objects?  Patient denies   Wandering behavior?   denies   Any personality changes since last visit?  She has a history of anxiety, "not feeling safe is not surrounded by my family "She been referred to behavioral health in the past, but she is not  very interested in it.  She currently not taking psych medications. Any worsening depression?: Denies  Hallucinations or paranoia?  denies   Seizures?   denies    Any sleep changes? Sleeps well.  Denies vivid dreams, REM behavior or sleepwalking. Sleep apnea?   Denies.    Any hygiene concerns?   denies   Independent of bathing and dressing?  Endorsed  Does the patient needs help with medications? Patient is in charge   Who is in charge of the finances?  Patient is in charge     Any changes in appetite? Very good.  Patient have trouble swallowing?  denies   Does the patient cook?  Yes.  any kitchen accidents such as leaving the stove on?  Not recently. Any headaches?    denies   Vision changes?  She has a history of cataracts Chronic pain?  denies   Ambulates with difficulty?   denies    Recent falls or head injuries?    denies      Unilateral weakness, numbness or tingling?   denies   Any tremors?  When she is more anxious, she experiences some tremors, without any other parkinsonian features. Any anosmia? Denies Any incontinence of urine?  denies   Any bowel dysfunction?  She has IBS constipation, takes meds and stays regular Patient lives alone Does the patient drive?  Yes, there was 1 episode in the past where she did not remember why she drove to a store, without recurrence   MRI of the brain,  personally reviewed 09/13/2023 without evidence of acute intracranial abnormality, no age advanced or lobar predominant parenchymal atrophy, mild chronic small vessel ischemia.  Past Medical History:  Diagnosis Date   Abdominal pain 08/16/2008   Qualifier: Diagnosis of  By: Daphine Deutscher FNP, [name]     Abdominal pain, epigastric 09/08/2019   Allergy    seasonal, PCN, antidepressants, bentyl   Aortic atherosclerosis (HCC) 02/23/2021   Arthritis    Asthma    Asymptomatic bacteriuria 08/24/2021   UA showed leukocytes but patient does not have dysuria, fevers or hematuria. She also had  bacteruria in her last UA. Her symptoms of fatigue, and loss of appetite are non-specific but if persist or she begins having dysuria we can work up further. A UTI would not explain the abdominal pain patient is having.    Carpal tunnel syndrome on both sides    right worse than left 2008   Cataract 06/11/2013   COPD (chronic obstructive pulmonary disease) (HCC)    COVID-19 virus infection 06/18/2016   Eczema 05/02/2018   Food poisoning 04/04/2021   GERD (gastroesophageal reflux disease)    Hair loss 07/07/2019   Hyperlipidemia    LDL 108 JULY 2013   IBS (irritable bowel syndrome)    Macular rash 05/20/2019   Multiple nevi 03/01/2015   Pneumonia    Positive TB test    From [facility] 2009 note : Recent F/u at [facility]. CXRAy negative.  Positive TST in 2007 (14mm).  Unable to tolerate INH due to hepatotoxicity   Primary insomnia 06/05/2012   Skin macule 04/21/2019   Tinnitus 07/07/2019   URI (upper respiratory infection) 11/01/2010   Qualifier: Diagnosis of  By: Daphine Deutscher FNP, Zena Amos       Past Surgical History:  Procedure Laterality Date   BREAST CYST EXCISION Right 2011   BREAST EXCISIONAL BIOPSY     benign   BREAST EXCISIONAL BIOPSY Right 1990   benign   INDUCED ABORTION  2005   VAGINAL HYSTERECTOMY  1983   partial; for abnormal bleeding     PREVIOUS MEDICATIONS:   CURRENT MEDICATIONS:  Outpatient Encounter Medications as of 11/28/2023  Medication Sig   albuterol (VENTOLIN HFA) 108 (90 Base) MCG/ACT inhaler Inhale 1 puff into the lungs every 6 (six) hours as needed for wheezing or shortness of breath. USE 2 INHALATIONS BY MOUTH EVERY 6 HOURS AS NEEDED FOR WHEEZING  OR SHORTNESS OF BREATH   Artificial Tear Ointment (DRY EYES OP) Apply 1 drop to eye daily as needed (dry eyes).   Ascorbic Acid (VITAMIN C) 100 MG tablet Take 50 mg by mouth daily.   aspirin EC 81 MG tablet Take 1 tablet (81 mg total) by mouth daily. Swallow whole.   Budeson-Glycopyrrol-Formoterol (BREZTRI AEROSPHERE) 160-9-4.8  MCG/ACT AERO Inhale 2 puffs into the lungs in the morning and at bedtime.   carboxymethylcellul-glycerin (OPTIVE) 0.5-0.9 % ophthalmic solution 1 drop 4 (four) times daily as needed for dry eyes.   cholecalciferol (VITAMIN D3) 25 MCG (1000 UNIT) tablet Take 1 tablet (1,000 Units total) by mouth daily.   cromolyn (OPTICROM) 4 % ophthalmic solution Place 1 drop into both eyes 2 (two) times daily.   diltiazem (CARDIZEM) 30 MG tablet Take 1 tablet (30 mg total) by mouth 2 (two) times daily as needed (For palpitations).   ezetimibe (ZETIA) 10 MG tablet Take 1 tablet (10 mg total) by mouth daily.   fexofenadine (ALLEGRA) 180 MG tablet Take 1 tablet (180 mg total) by mouth daily.   mometasone (NASONEX)  50 MCG/ACT nasal spray PLACE 2 SPRAYS IN EACH NOSTRIL ONCE DAILY AS NEEDED. (Patient taking differently: Place 2 sprays into the nose daily.)   Multiple Vitamins-Minerals (MULTIVITAMIN WITH MINERALS) tablet Take 1 tablet by mouth daily.   pantoprazole (PROTONIX) 40 MG tablet Take 1 tablet (40 mg total) by mouth 2 (two) times daily before a meal for 15 days.   rosuvastatin (CRESTOR) 20 MG tablet Take 1 tablet (20 mg total) by mouth daily.   Facility-Administered Encounter Medications as of 11/28/2023  Medication   acetaminophen (TYLENOL) tablet 1,000 mg   diclofenac Sodium (VOLTAREN) 1 % topical gel 4 g     Objective:     PHYSICAL EXAMINATION:    VITALS:   Vitals:   11/28/23 1501  BP: 114/64  Pulse: 79  Resp: 20  SpO2: 98%  Weight: 151 lb (68.5 kg)    GEN:  The patient appears stated age and is in NAD. HEENT:  Normocephalic, atraumatic.   Neurological examination:  General: NAD, well-groomed, appears stated age. Orientation: The patient is alert. Oriented to person, place and date Cranial nerves: There is good facial symmetry. Anxious appearing. The speech is fluent and clear. No aphasia or dysarthria. Fund of knowledge is appropriate. Recent memory impaired and remote memory is normal.  Attention and concentration are normal.  Able to name objects and repeat phrases.  Hearing is intact to conversational tone . Delayed recall 3/3 Sensation: Sensation is intact to light touch throughout Motor: Strength is at least antigravity x4. DTR's 2/4 in UE/LE      08/29/2023    1:00 PM  Montreal Cognitive Assessment   Visuospatial/ Executive (0/5) 3  Naming (0/3) 2  Attention: Read list of digits (0/2) 2  Attention: Read list of letters (0/1) 1  Attention: Serial 7 subtraction starting at 100 (0/3) 1  Language: Repeat phrase (0/2) 1  Language : Fluency (0/1) 1  Abstraction (0/2) 0  Delayed Recall (0/5) 2  Orientation (0/6) 6  Total 19  Adjusted Score (based on education) 20        No data to display             Movement examination: Tone: There is normal tone in the UE/LE Abnormal movements:  no tremor.  No myoclonus.  No asterixis.   Coordination:  There is no decremation with RAM's. Normal finger to nose  Gait and Station: The patient has no difficulty arising out of a deep-seated chair without the use of the hands. The patient's stride length is good.  Gait is cautious and narrow.   Thank you for allowing [place] the opportunity to participate in the care of this nice patient. Please do not hesitate to contact us for any questions or concerns.   Total time spent on today's visit was 27 minutes dedicated to this patient today, preparing to see patient, examining the patient, ordering tests and/or medications and counseling the patient, documenting clinical information in the EHR or other health record, independently interpreting results and communicating results to the patient/family, discussing treatment and goals, answering patient's questions and coordinating care.  Cc:  Modena Slater, DO  [facility] 11/28/2023 3:16 PM

## 2023-11-28 NOTE — Patient Instructions (Signed)
 It was a pleasure to see you today at our office.   Recommendations:  Neurocognitive evaluation at our office   Follow up pending on the results of the neurocognitive testing   For psychiatric meds, mood meds: Please have your primary care physician manage these medications.  If you have any severe symptoms of a stroke, or other severe issues such as confusion,severe chills or fever, etc call 911 or go to the ER as you may need to be evaluated further     For assessment of decision of mental capacity and competency:  Call Dr. Erick Blinks, geriatric psychiatrist at [phone]  Counseling regarding caregiver distress, including caregiver depression, anxiety and issues regarding community resources, adult day care programs, adult living facilities, or memory care questions:  please contact your  Primary Doctor's Social Worker   Whom to call: Memory  decline, memory medications: Call our office [phone]    [email or web address]   RECOMMENDATIONS FOR ALL PATIENTS WITH MEMORY PROBLEMS: 1. Continue to exercise (Recommend 30 minutes of walking everyday, or 3 hours every week) 2. Increase social interactions - continue going to [place] and enjoy social gatherings with friends and family 3. Eat healthy, avoid fried foods and eat more fruits and vegetables 4. Maintain adequate blood pressure, blood sugar, and blood cholesterol level. Reducing the risk of stroke and cardiovascular disease also helps promoting better memory. 5. Avoid stressful situations. Live a simple life and avoid aggravations. Organize your time and prepare for the next day in anticipation. 6. Sleep well, avoid any interruptions of sleep and avoid any distractions in the bedroom that may interfere with adequate sleep quality 7. Avoid sugar, avoid sweets as there is a strong link between excessive sugar intake, diabetes, and cognitive impairment We discussed the  Mediterranean diet, which has been shown to help patients reduce the risk of progressive memory disorders and reduces cardiovascular risk. This includes eating fish, eat fruits and green leafy vegetables, nuts like almonds and hazelnuts, walnuts, and also use olive oil. Avoid fast foods and fried foods as much as possible. Avoid sweets and sugar as sugar use has been linked to worsening of memory function.  There is always a concern of gradual progression of memory problems. If this is the case, then we may need to adjust level of care according to patient needs. Support, both to the patient and caregiver, should then be put into place.      You have been referred for a neuropsychological evaluation (i.e., evaluation of memory and thinking abilities). Please bring someone with you to this appointment if possible, as it is helpful for the doctor to hear from both you and another adult who knows you well. Please bring eyeglasses and hearing aids if you wear them.    The evaluation will take approximately 3 hours and has two parts:   The first part is a clinical interview with the neuropsychologist (Dr. Milbert Coulter or Dr. Roseanne Reno). During the interview, the neuropsychologist will speak with you and the individual you brought to the appointment.    The second part of the evaluation is testing with the doctor's technician Annabelle Harman or Selena Batten). During the testing, the technician will ask you to remember different types of material, solve problems, and answer some questionnaires. Your family member will not be present for this portion of the evaluation.   Please note: We must reserve several hours of the neuropsychologist's time and the psychometrician's time for your evaluation appointment. As such, there is a No-Show fee of $100.  If you are unable to attend any of your appointments, please contact our office as soon as possible to reschedule.      DRIVING: Regarding driving, in patients with progressive memory  problems, driving will be impaired. We advise to have someone else do the driving if trouble finding directions or if minor accidents are reported. Independent driving assessment is available to determine safety of driving.   If you are interested in the driving assessment, you can contact the following:  [employer] in [place] [phone]  [facility] [phone]  [facility] [phone]  [facility] [phone] or [phone]   FALL PRECAUTIONS: Be cautious when walking. Scan the area for obstacles that may increase the risk of trips and falls. When getting up in the mornings, sit up at the edge of the bed for a few minutes before getting out of bed. Consider elevating the bed at the head end to avoid drop of blood pressure when getting up. Walk always in a well-lit room (use night lights in the walls). Avoid area rugs or power cords from appliances in the middle of the walkways. Use a walker or a cane if necessary and consider physical therapy for balance exercise. Get your eyesight checked regularly.  FINANCIAL OVERSIGHT: Supervision, especially oversight when making financial decisions or transactions is also recommended.  HOME SAFETY: Consider the safety of the kitchen when operating appliances like stoves, microwave oven, and blender. Consider having supervision and share cooking responsibilities until no longer able to participate in those. Accidents with firearms and other hazards in the house should be identified and addressed as well.   ABILITY TO BE LEFT ALONE: If patient is unable to contact 911 operator, consider using LifeLine, or when the need is there, arrange for someone to stay with patients. Smoking is a fire hazard, consider supervision or cessation. Risk of wandering should be assessed by caregiver and if detected at any point, supervision and safe proof recommendations should be instituted.  MEDICATION SUPERVISION:  Inability to self-administer medication needs to be constantly addressed. Implement a mechanism to ensure safe administration of the medications.      Mediterranean Diet A Mediterranean diet refers to food and lifestyle choices that are based on the traditions of countries located on the [employer]. This way of eating has been shown to help prevent certain conditions and improve outcomes for people who have chronic diseases, like kidney disease and heart disease. What are tips for following this plan? Lifestyle  Cook and eat meals together with your family, when possible. Drink enough fluid to keep your urine clear or pale yellow. Be physically active every day. This includes: Aerobic exercise like running or swimming. Leisure activities like gardening, walking, or housework. Get 7-8 hours of sleep each night. If recommended by your health care provider, drink red wine in moderation. This means 1 glass a day for nonpregnant women and 2 glasses a day for men. A glass of wine equals 5 oz (150 mL). Reading food labels  Check the serving size of packaged foods. For foods such as rice and pasta, the serving size refers to the amount of cooked product, not dry. Check the total fat in packaged foods. Avoid foods that have saturated fat or trans fats. Check the ingredients list for added sugars, such as corn syrup. Shopping  At the grocery store, buy most of your food from the areas near the walls of the store. This includes: Fresh fruits and vegetables (produce). Grains, beans,  nuts, and seeds. Some of these may be available in unpackaged forms or large amounts (in bulk). Fresh seafood. Poultry and eggs. Low-fat dairy products. Buy whole ingredients instead of prepackaged foods. Buy fresh fruits and vegetables in-season from local farmers markets. Buy frozen fruits and vegetables in resealable bags. If you do not have access to quality fresh seafood, buy precooked frozen shrimp or  canned fish, such as tuna, salmon, or sardines. Buy small amounts of raw or cooked vegetables, salads, or olives from the deli or salad bar at your store. Stock your pantry so you always have certain foods on hand, such as olive oil, canned tuna, canned tomatoes, rice, pasta, and beans. Cooking  Cook foods with extra-virgin olive oil instead of using butter or other vegetable oils. Have meat as a side dish, and have vegetables or grains as your main dish. This means having meat in small portions or adding small amounts of meat to foods like pasta or stew. Use beans or vegetables instead of meat in common dishes like chili or lasagna. Experiment with different cooking methods. Try roasting or broiling vegetables instead of steaming or sauteing them. Add frozen vegetables to soups, stews, pasta, or rice. Add nuts or seeds for added healthy fat at each meal. You can add these to yogurt, salads, or vegetable dishes. Marinate fish or vegetables using olive oil, lemon juice, garlic, and fresh herbs. Meal planning  Plan to eat 1 vegetarian meal one day each week. Try to work up to 2 vegetarian meals, if possible. Eat seafood 2 or more times a week. Have healthy snacks readily available, such as: Vegetable sticks with hummus. Greek yogurt. Fruit and nut trail mix. Eat balanced meals throughout the week. This includes: Fruit: 2-3 servings a day Vegetables: 4-5 servings a day Low-fat dairy: 2 servings a day Fish, poultry, or lean meat: 1 serving a day Beans and legumes: 2 or more servings a week Nuts and seeds: 1-2 servings a day Whole grains: 6-8 servings a day Extra-virgin olive oil: 3-4 servings a day Limit red meat and sweets to only a few servings a month What are my food choices? Mediterranean diet Recommended Grains: Whole-grain pasta. Brown rice. Bulgar wheat. Polenta. Couscous. Whole-wheat bread. Orpah Cobb. Vegetables: Artichokes. Beets. Broccoli. Cabbage. Carrots. Eggplant.  Green beans. Chard. Kale. Spinach. Onions. Leeks. Peas. Squash. Tomatoes. Peppers. Radishes. Fruits: Apples. Apricots. Avocado. Berries. Bananas. Cherries. Dates. Figs. Grapes. Lemons. Melon. Oranges. Peaches. Plums. Pomegranate. Meats and other protein foods: Beans. Almonds. Sunflower seeds. Pine nuts. Peanuts. Cod. Salmon. Scallops. Shrimp. Tuna. Tilapia. Clams. Oysters. Eggs. Dairy: Low-fat milk. Cheese. Greek yogurt. Beverages: Water. Red wine. Herbal tea. Fats and oils: Extra virgin olive oil. Avocado oil. Grape seed oil. Sweets and desserts: [place] yogurt with honey. Baked apples. Poached pears. Trail mix. Seasoning and other foods: Basil. Cilantro. Coriander. Cumin. Mint. Parsley. Sage. Rosemary. Tarragon. Garlic. Oregano. Thyme. Pepper. Balsalmic vinegar. Tahini. Hummus. Tomato sauce. Olives. Mushrooms. Limit these Grains: Prepackaged pasta or rice dishes. Prepackaged cereal with added sugar. Vegetables: Deep fried potatoes (french fries). Fruits: Fruit canned in syrup. Meats and other protein foods: Beef. Pork. Lamb. Poultry with skin. Hot dogs. Tomasa Blase. Dairy: Ice cream. Sour cream. Whole milk. Beverages: Juice. Sugar-sweetened soft drinks. Beer. Liquor and spirits. Fats and oils: Butter. Canola oil. Vegetable oil. Beef fat (tallow). Lard. Sweets and desserts: Cookies. Cakes. Pies. Candy. Seasoning and other foods: Mayonnaise. Premade sauces and marinades. The items listed may not be a complete list. Talk with your dietitian about what  dietary choices are right for you. Summary The Mediterranean diet includes both food and lifestyle choices. Eat a variety of fresh fruits and vegetables, beans, nuts, seeds, and whole grains. Limit the amount of red meat and sweets that you eat. Talk with your health care provider about whether it is safe for you to drink red wine in moderation. This means 1 glass a day for nonpregnant women and 2 glasses a day for men. A glass of wine equals 5 oz (150  mL). This information is not intended to replace advice given to you by your health care provider. Make sure you discuss any questions you have with your health care provider. Document Released: 04/26/2016 Document Revised: 05/29/2016 Document Reviewed: 04/26/2016 Elsevier Interactive Patient Education  2017 [employer].

## 2023-12-08 ENCOUNTER — Other Ambulatory Visit: Payer: Self-pay | Admitting: Cardiology

## 2023-12-11 ENCOUNTER — Telehealth: Payer: Self-pay | Admitting: Pharmacist

## 2023-12-11 ENCOUNTER — Ambulatory Visit: Payer: Self-pay | Admitting: Student

## 2023-12-11 NOTE — Telephone Encounter (Signed)
  Chief Complaint: chronic weakness Symptoms: stomach pain, IBS sx preventing pt from eating enough calories to maintain energy (per pt) Frequency: years  Pertinent Negatives: Patient denies chest pain, fever, cough, SOB, vomiting,diarrhea, bleeding Disposition: [] ED /[] Urgent Care (no appt availability in office) / [] Appointment(In office/virtual)/ []  [place] [facility]/ [] Home Care/ [] Refused Recommended Disposition /[] [place] [facility]/ [x]  Follow-up with PCP Additional Notes: pt wanting to changed GI providers from [facility] services on [address]. Pt stated it takes to long to be seen at current clinic. Pt wanted to know if she needs a new referral. Pt checking with her insurance to see if she can self refer.  Copied from CRM [id number]. Topic: Clinical - Red Word Triage >> Dec 11, 2023  3:03 PM Philippa Chester F wrote: Red Word that prompted transfer to Nurse Triage: weakness; lack of consuming food because of IBS  Patient seems overwhelmed with not knowing what to eat and to lessen the flare ups of her IBS. Reason for Disposition  Weakness is a chronic symptom (recurrent or ongoing AND present > 4 weeks)  Answer Assessment - Initial Assessment Questions 1. DESCRIPTION: "Describe how you are feeling."     weak 2. SEVERITY: "How bad is it?"  "Can you stand and walk?"   - MILD (0-3): Feels weak or tired, but does not interfere with work, school or normal activities.   - MODERATE (4-7): Able to stand and walk; weakness interferes with work, school, or normal activities.   - SEVERE (8-10): Unable to stand or walk; unable to do usual activities.     mild 3. ONSET: "When did these symptoms begin?" (e.g., hours, days, weeks, months)     Years  4. CAUSE: "What do you think is causing the weakness or fatigue?" (e.g., not drinking enough fluids, medical problem, trouble sleeping)     Not eating enough food or protein 5. NEW MEDICINES:  "Have  you started on any new medicines recently?" (e.g., opioid pain medicines, benzodiazepines, muscle relaxants, antidepressants, antihistamines, neuroleptics, beta blockers)     no 6. OTHER SYMPTOMS: "Do you have any other symptoms?" (e.g., chest pain, fever, cough, SOB, vomiting, diarrhea, bleeding, other areas of pain)     Stomach pain (not a new sx)- weakness (chronic)  Protocols used: Weakness (Generalized) and Fatigue-A-AH

## 2023-12-11 NOTE — Telephone Encounter (Signed)
 Call to remind for follow up lipid lab. See encounter from 09/13/2023  Patient will go for lipid lab on April 2.

## 2023-12-12 NOTE — Telephone Encounter (Signed)
 Appt 12/18/23 with DR Rayvon Char.

## 2023-12-12 NOTE — Telephone Encounter (Signed)
Called pt to schedule an appt - no answer and mailbox is full; unable to leave a message. ?

## 2023-12-13 ENCOUNTER — Telehealth: Payer: Self-pay | Admitting: Student

## 2023-12-13 NOTE — Telephone Encounter (Signed)
 Pt has been scheduled for an appointment on the following day to f/u with her concerns and has also spoken with a triage Nurse.    Appt as follows to address concerns:  Name: Aimee Davis, Bojarski MRN: [id number]  Date: 12/18/2023 Status: Sch  Time: 1:45 PM Length: 30  Visit Type: OPEN ESTABLISHED [726] Copay: $0.00  Provider: Morrie Sheldon, MD      Copied from CRM [id number]. Topic: Referral - Request for Referral >> Dec 11, 2023  2:48 PM Philippa Chester F wrote: Did the patient discuss referral with their provider in the last year? Yes (If No - schedule appointment) (If Yes - send message)  Appointment offered? Yes  Type of order/referral and detailed reason for visit:   Patient would like to see a female gastroenterologist to better assist her regularly to manage her IBS. Patient has seen her PCP about her GI concerns but feels it is time to see a specialist. Patient stated her stomach inflammation started Sunday 12/01/23 where she feels full a lot but has trouble with what to eat.   Preference of office, provider, location:   [name], MD  [facility] [address] [place], [place] [address]  Phone: [phone]   If referral order, have you been seen by this specialty before? No (If Yes, this issue or another issue? When? Where?  Can we respond through MyChart? Yes, patient would also like to be called once the referral has been placed. [phone]

## 2023-12-18 ENCOUNTER — Telehealth: Payer: Self-pay | Admitting: *Deleted

## 2023-12-18 ENCOUNTER — Ambulatory Visit: Admitting: Student

## 2023-12-18 ENCOUNTER — Encounter: Payer: Self-pay | Admitting: Student

## 2023-12-18 VITALS — BP 126/65 | HR 65 | Temp 97.6°F | Ht 63.5 in | Wt 146.2 lb

## 2023-12-18 DIAGNOSIS — K589 Irritable bowel syndrome without diarrhea: Secondary | ICD-10-CM

## 2023-12-18 DIAGNOSIS — K582 Mixed irritable bowel syndrome: Secondary | ICD-10-CM

## 2023-12-18 DIAGNOSIS — N390 Urinary tract infection, site not specified: Secondary | ICD-10-CM

## 2023-12-18 DIAGNOSIS — N3281 Overactive bladder: Secondary | ICD-10-CM

## 2023-12-18 LAB — POCT URINALYSIS DIPSTICK
Bilirubin, UA: NEGATIVE
Blood, UA: NEGATIVE
Glucose, UA: NEGATIVE
Ketones, UA: NEGATIVE
Nitrite, UA: NEGATIVE
Protein, UA: NEGATIVE
Spec Grav, UA: 1.01 (ref 1.010–1.025)
Urobilinogen, UA: 0.2 U/dL
pH, UA: 6 (ref 5.0–8.0)

## 2023-12-18 NOTE — Assessment & Plan Note (Signed)
 She would like to speak to a dietitian to help her figure out her foods that irritate her IBS.  I will place a referral.  I advised her to start a food diary as well to identify her triggers.  She continues to see GI and has an appointment with them in July.

## 2023-12-18 NOTE — Patient Instructions (Signed)
 Avoid caffeine and alcohol  Avoid fluids starting 3 hours before bed  Time your bathroom trips - try to go a little longer each time

## 2023-12-18 NOTE — Telephone Encounter (Signed)
 Pt was late for her appt today; doctor unable to see pt. Pt c/o weakness and urinary frequency. Dr Ninfa Meeker agreed to see pt.

## 2023-12-18 NOTE — Assessment & Plan Note (Signed)
 2 weeks of increased urinary frequency.  On chart reviewed appears this has been an intermittent issue in the past previously treated conservatively.  She reports that for 2 weeks she has urinated approximately 10 times per day, including overnight, typically voiding her normal amount.  She does not have dysuria, fever, incontinence, medication changes, risk factors for neurogenic bladder.  UA with some leukocytes, but otherwise bland.  She notes that her overactive bladder has coincided with an increase in her IBS symptoms, primarily intermittent abdominal pain and diarrhea.  She is relieved she does not have a UTI. Will start with conservative treatment for overactive bladder.  She does not drink alcohol or caffeine.  She prefers to avoid medicine for this issue. - Avoid fluids 3 hours before bed - Voiding diary with goal to increase time between voids

## 2023-12-18 NOTE — Progress Notes (Signed)
 CC: Urinary frequency, concern for UTI  HPI:  [name] is a 68 y.o. female with a PMH stated below who presents today for acute evaluation of urinary frequency. She wonders if she has a UTI.  Symptoms ongoing primarily she urinates 10 times a day, typically has been normal voids.  No dysuria, color change, odor, fever, chills, incontinence, trouble starting or stopping, new medicine.  Please see problem based assessment and plan for additional details.  Past Medical History:  Diagnosis Date   Abdominal pain 08/16/2008   Qualifier: Diagnosis of  By: Daphine Deutscher FNP, [name]     Abdominal pain, epigastric 09/08/2019   Allergy    seasonal, PCN, antidepressants, bentyl   Aortic atherosclerosis (HCC) 02/23/2021   Arthritis    Asthma    Asymptomatic bacteriuria 08/24/2021   UA showed leukocytes but patient does not have dysuria, fevers or hematuria. She also had bacteruria in her last UA. Her symptoms of fatigue, and loss of appetite are non-specific but if persist or she begins having dysuria we can work up further. A UTI would not explain the abdominal pain patient is having.    Carpal tunnel syndrome on both sides    right worse than left 2008   Cataract 06/11/2013   COPD (chronic obstructive pulmonary disease) (HCC)    COVID-19 virus infection 06/18/2016   Eczema 05/02/2018   Food poisoning 04/04/2021   GERD (gastroesophageal reflux disease)    Hair loss 07/07/2019   Hyperlipidemia    LDL 108 JULY 2013   IBS (irritable bowel syndrome)    Macular rash 05/20/2019   Multiple nevi 03/01/2015   Pneumonia    Positive TB test    From [facility] 2009 note : Recent F/u at [facility]. CXRAy negative.  Positive TST in 2007 (14mm).  Unable to tolerate INH due to hepatotoxicity   Primary insomnia 06/05/2012   Skin macule 04/21/2019   Tinnitus 07/07/2019   URI (upper respiratory infection) 11/01/2010   Qualifier: Diagnosis of  By: Daphine Deutscher FNP, Zena Amos      Review of Systems: ROS negative except for  what is noted on the assessment and plan.  Vitals:   12/18/23 1420  BP: 126/65  Pulse: 65  Temp: 97.6 F (36.4 C)  TempSrc: Oral  SpO2: 100%  Weight: 146 lb 3.2 oz (66.3 kg)  Height: 5' 3.5" (1.613 m)    Physical Exam: Constitutional: well-appearing woman in no acute distress Cardiovascular: regular rate and rhythm, no m/r/g Pulmonary/Chest: normal work of breathing on room air, lungs clear to auscultation bilaterally Abdominal: soft, non-tender, non-distended. No suprapubic tenderness. Skin: warm and dry Psych: normal mood and behavior  Assessment & Plan:   Patient discussed with Dr. Mayford Knife  Overactive bladder 2 weeks of increased urinary frequency.  On chart reviewed appears this has been an intermittent issue in the past previously treated conservatively.  She reports that for 2 weeks she has urinated approximately 10 times per day, including overnight, typically voiding her normal amount.  She does not have dysuria, fever, incontinence, medication changes, risk factors for neurogenic bladder.  UA with some leukocytes, but otherwise bland.  She notes that her overactive bladder has coincided with an increase in her IBS symptoms, primarily intermittent abdominal pain and diarrhea.  She is relieved she does not have a UTI. Will start with conservative treatment for overactive bladder.  She does not drink alcohol or caffeine.  She prefers to avoid medicine for this issue. - Avoid fluids 3 hours before bed -  Voiding diary with goal to increase time between voids  Irritable bowel syndrome She would like to speak to a dietitian to help her figure out her foods that irritate her IBS.  I will place a referral.  I advised her to start a food diary as well to identify her triggers.  She continues to see GI and has an appointment with them in July.  RTC 3 month general checkup.  Katheran James, D.O. [facility] Internal Medicine, PGY-1 Phone: [phone] Date 12/18/2023 Time  3:05 PM

## 2023-12-19 LAB — LIPID PANEL
Chol/HDL Ratio: 1.9 ratio (ref 0.0–4.4)
Cholesterol, Total: 201 mg/dL — ABNORMAL HIGH (ref 100–199)
HDL: 107 mg/dL (ref >39)
LDL Chol Calc (NIH): 81 mg/dL (ref 0–99)
Triglycerides: 75 mg/dL (ref 0–149)
VLDL Cholesterol Cal: 13 mg/dL (ref 5–40)

## 2023-12-20 ENCOUNTER — Telehealth: Payer: Self-pay | Admitting: Pharmacy Technician

## 2023-12-20 ENCOUNTER — Other Ambulatory Visit (HOSPITAL_COMMUNITY): Payer: Self-pay

## 2023-12-20 ENCOUNTER — Telehealth: Payer: Self-pay | Admitting: Pharmacist

## 2023-12-20 NOTE — Telephone Encounter (Signed)
 LDL - 81 mg/dl while on Crestor 10 mg daily and Zetia 10 mg daily, reports Crestor 10 mg causes stomach pain. Will initiate PA for PCSK9i - afraid of injections but willing to try. Apt scheduled for April 11 at 8:15 for injection training.

## 2023-12-20 NOTE — Telephone Encounter (Signed)
 Pharmacy Patient Advocate Encounter   Received notification from Pt Calls Messages that prior authorization for repatha is required/requested.   Insurance verification completed.   The patient is insured through [employer] .   Per test claim: PA required; PA submitted to above mentioned insurance via CoverMyMeds Key/confirmation #/EOC [id number] Status is pending

## 2023-12-23 ENCOUNTER — Other Ambulatory Visit (HOSPITAL_COMMUNITY): Payer: Self-pay

## 2023-12-23 MED ORDER — REPATHA SURECLICK 140 MG/ML ~~LOC~~ SOAJ: 140.0000 mg | SUBCUTANEOUS | 3 refills | Status: AC

## 2023-12-23 NOTE — Telephone Encounter (Signed)
 Pharmacy Patient Advocate Encounter  Received notification from [facility] that Prior Authorization for repatha has been APPROVED from 12/20/23 to 06/20/24. Ran test claim, Copay is $0.00. This test claim was processed through [facility]- copay amounts may vary at other pharmacies due to pharmacy/plan contracts, or as the patient moves through the different stages of their insurance plan.   PA #/Case ID/Reference #: [id number]

## 2023-12-23 NOTE — Addendum Note (Signed)
 Addended by: Tylene Fantasia on: 12/23/2023 01:09 PM   Modules accepted: Orders

## 2023-12-27 ENCOUNTER — Other Ambulatory Visit: Payer: Self-pay

## 2023-12-27 ENCOUNTER — Ambulatory Visit: Attending: Cardiology | Admitting: Pharmacist

## 2023-12-27 ENCOUNTER — Encounter: Payer: Self-pay | Admitting: Pharmacist

## 2023-12-27 DIAGNOSIS — E785 Hyperlipidemia, unspecified: Secondary | ICD-10-CM | POA: Diagnosis not present

## 2023-12-27 MED ORDER — EZETIMIBE 10 MG PO TABS: 10.0000 mg | ORAL_TABLET | Freq: Every day | ORAL | 0 refills | Status: DC

## 2023-12-27 NOTE — Patient Instructions (Signed)
 Your Results:             Your most recent labs Goal  Total Cholesterol 201 < 200  Triglycerides 75 < 150  HDL (happy/good cholesterol) 107 > 40  LDL (lousy/bad cholesterol 81 < 70

## 2023-12-27 NOTE — Progress Notes (Signed)
 Patient ID: Aimee Davis                 DOB: 1956-02-04                    MRN: [id number]      HPI: Aimee Davis is a 68 y.o. female patient referred to lipid clinic by Cr.Chandrasekhar. PMH is significant for CAD, COPD, GERD, statin intolerance, HLD, MDD.  Patient saw me in Oct 2024 for cholesterol medication management. Patient did not wanted to go on injectable therapy so she aggred to try Crestor. LDL was still above goal with Crestor 10 mg daily and Zetia 10 mg daily. She compliant about stomach pain from Crestor and we discussed injectable therapy over the phone.  PA was initiated and approved. Patient presenting to clinic today to learn about Repatha.  Patient was very nervous today did not pick up Repatha injection from pharmacy. She is not able to tolerate Zetia  due to insomnia and upset stomach and Crestor due to myalgia and upset stomach. She want ed to be off of oral agent but afraid of needles so does not want to do self injections. I spend 45 min going over all options again and again with detail discussion on side effects of each and LDL lowering effects. She could not make her mind so will call us back whenever she is ready   Current Medications: Zetia 10 mg daily , Crestor 10 mg daily  Intolerances: Lipitor - upset stomach, cramps in legs, poor appetite, and sleep disturbance ,Crestor 10 mg daily - stomach pain  Risk Factors: CAC score 90,CAD, family hx of ASCVD  LDL goal: <70 Last lipid lab on 06/24/2023 LDLc 105, HDL 129, TG 43, TC 241  Diet: due to IBS she has to watch what she eats  Breakfast: eggs, grits, toast and avocado Lunch : salad with backed chicken or spinach with baked fish  Dinner: baked chicken, baked sweet potatoes  Drink: water, green tea, ginger tea   Snacks: chips, peanut butter with crackers, nuts Exercise:  None   Family History:  Relation Problem Comments  Mother Arthritis   Diabetes   Heart disease   Hypertension     Father (Deceased)  Diabetes   Heart disease   Hyperlipidemia   Hypertension   Kidney disease   Stroke     Sister - Robin Breast cancer   Hypertension     Sister - Engineer, maintenance (IT) Breast cancer     Sister - Chief Executive Officer (Alive) Arthritis   Hypertension     Sister - Scientist, clinical (histocompatibility and immunogenetics) (Deceased) Diabetes   Hypertension     Brother - Agricultural consultant Mental illness     Brother - Administrator, sports (Alive) Aneurysm brain    Brother - Armed forces operational officer (Alive) Arthritis     Maternal Aunt Hypertension     Maternal Uncle Hypertension   Stomach cancer     Paternal Aunt Hypertension   Kidney disease x 2    Paternal Uncle Hypertension   Kidney disease x 2      Social History:  Alcohol: none  Smoking: none  Labs: Lipid Panel     Component Value Date/Time   CHOL 201 (H) 12/18/2023 1535   TRIG 75 12/18/2023 1535   HDL 107 12/18/2023 1535   CHOLHDL 1.9 12/18/2023 1535   CHOLHDL 2.2 04/14/2015 1642   VLDL 52 (H) 04/14/2015 1642   LDLCALC 81 12/18/2023 1535   LABVLDL 13 12/18/2023 1535    Past Medical History:  Diagnosis Date   Abdominal pain 08/16/2008   Qualifier: Diagnosis of  By: Daphine Deutscher FNP, [name]     Abdominal pain, epigastric 09/08/2019   Allergy    seasonal, PCN, antidepressants, bentyl   Aortic atherosclerosis (HCC) 02/23/2021   Arthritis    Asthma    Asymptomatic bacteriuria 08/24/2021   UA showed leukocytes but patient does not have dysuria, fevers or hematuria. She also had bacteruria in her last UA. Her symptoms of fatigue, and loss of appetite are non-specific but if persist or she begins having dysuria we can work up further. A UTI would not explain the abdominal pain patient is having.    Carpal tunnel syndrome on both sides    right worse than left 2008   Cataract 06/11/2013   COPD (chronic obstructive pulmonary disease) (HCC)    COVID-19 virus infection 06/18/2016   Eczema 05/02/2018   Food poisoning 04/04/2021   GERD (gastroesophageal reflux disease)    Hair loss 07/07/2019   Hyperlipidemia    LDL 108 JULY 2013   IBS  (irritable bowel syndrome)    Macular rash 05/20/2019   Multiple nevi 03/01/2015   Pneumonia    Positive TB test    From [facility] 2009 note : Recent F/u at [facility]. CXRAy negative.  Positive TST in 2007 (14mm).  Unable to tolerate INH due to hepatotoxicity   Primary insomnia 06/05/2012   Skin macule 04/21/2019   Tinnitus 07/07/2019   URI (upper respiratory infection) 11/01/2010   Qualifier: Diagnosis of  By: Daphine Deutscher FNP, Zena Amos      Current Outpatient Medications on File Prior to Visit  Medication Sig Dispense Refill   albuterol (VENTOLIN HFA) 108 (90 Base) MCG/ACT inhaler Inhale 1 puff into the lungs every 6 (six) hours as needed for wheezing or shortness of breath. USE 2 INHALATIONS BY MOUTH EVERY 6 HOURS AS NEEDED FOR WHEEZING  OR SHORTNESS OF BREATH 26.8 g 2   Artificial Tear Ointment (DRY EYES OP) Apply 1 drop to eye daily as needed (dry eyes).     Ascorbic Acid (VITAMIN C) 100 MG tablet Take 50 mg by mouth daily.     aspirin EC 81 MG tablet Take 1 tablet (81 mg total) by mouth daily. Swallow whole. 90 tablet 3   Budeson-Glycopyrrol-Formoterol (BREZTRI AEROSPHERE) 160-9-4.8 MCG/ACT AERO Inhale 2 puffs into the lungs in the morning and at bedtime. 1 each 11   carboxymethylcellul-glycerin (OPTIVE) 0.5-0.9 % ophthalmic solution 1 drop 4 (four) times daily as needed for dry eyes.     cholecalciferol (VITAMIN D3) 25 MCG (1000 UNIT) tablet Take 1 tablet (1,000 Units total) by mouth daily. 60 tablet 1   cromolyn (OPTICROM) 4 % ophthalmic solution Place 1 drop into both eyes 2 (two) times daily.     diltiazem (CARDIZEM) 30 MG tablet Take 1 tablet (30 mg total) by mouth 2 (two) times daily as needed (For palpitations). 30 tablet 6   Evolocumab (REPATHA SURECLICK) 140 MG/ML SOAJ Inject 140 mg into the skin every 14 (fourteen) days. 6 mL 3   ezetimibe (ZETIA) 10 MG tablet Take 1 tablet (10 mg total) by mouth daily. 90 tablet 1   fexofenadine (ALLEGRA) 180 MG tablet Take 1 tablet (180 mg total) by mouth  daily. 90 tablet 1   mometasone (NASONEX) 50 MCG/ACT nasal spray PLACE 2 SPRAYS IN EACH NOSTRIL ONCE DAILY AS NEEDED. (Patient taking differently: Place 2 sprays into the nose daily.) 17 g 6   Multiple Vitamins-Minerals (MULTIVITAMIN WITH MINERALS) tablet Take 1  tablet by mouth daily.     pantoprazole (PROTONIX) 40 MG tablet Take 1 tablet (40 mg total) by mouth 2 (two) times daily before a meal for 15 days. 30 tablet 0   rosuvastatin (CRESTOR) 10 MG tablet Take 1 tablet (10 mg total) by mouth daily.     Current Facility-Administered Medications on File Prior to Visit  Medication Dose Route Frequency Provider Last Rate Last Admin   acetaminophen (TYLENOL) tablet 1,000 mg  1,000 mg Oral Once        diclofenac Sodium (VOLTAREN) 1 % topical gel 4 g  4 g Topical Daily PRN         Allergies  Allergen Reactions   Dicyclomine     confusion   Hydrocodone Other (See Comments)    Recovering addict X 29 years; no narcotics per patient   Lipitor [Atorvastatin] Other (See Comments)    Pt reports causes upset stomach, cramps in legs, poor appetite, and sleep disturbance   Penicillins Other (See Comments)    REACTION: rash   Levsin [Hyoscyamine] Anxiety    Assessment/Plan:  1. Hyperlipidemia -  Patient saw me in Oct 2024 for cholesterol medication management. Patient did not wanted to go on injectable therapy so she aggred to try Crestor. LDL was still above goal with Crestor 10 mg daily and Zetia 10 mg daily. She compliant about stomach pain from Crestor and we discussed injectable therapy over the phone.  PA was initiated and approved. Patient presenting to clinic today to learn about Repatha.  Patient was very nervous today did not pick up Repatha injection from pharmacy. She is not able to tolerate Zetia  due to insomnia and upset stomach and Crestor due to myalgia and upset stomach. She wants o be off of oral agent but afraid of needles so does not want to do self injections. I spend 45 min going  over all options again and again with detail discussion on side effects of each and LDL lowering effects. Her son was able to join [place] over the phone, I explained lipid lab and potential options with risk and benefit to him too. She could not make her mind so will call us back whenever she is ready    Thank you,  Carmela Hurt, Pharm.D [facility] A Division of [facility] [address], [place], [place] [address]  Phone: [phone]; Fax: [phone]

## 2023-12-28 NOTE — Progress Notes (Signed)
 Internal Medicine Clinic Attending  Case discussed with the resident at the time of the visit.  We reviewed the resident's history and exam and pertinent patient test results.  I agree with the assessment, diagnosis, and plan of care documented in the resident's note.

## 2023-12-30 ENCOUNTER — Telehealth: Payer: Self-pay | Admitting: Student

## 2023-12-30 DIAGNOSIS — E782 Mixed hyperlipidemia: Secondary | ICD-10-CM

## 2023-12-30 NOTE — Telephone Encounter (Signed)
 Will be coming to get training on Repatha injection tomorrow at 11:00.

## 2023-12-30 NOTE — Telephone Encounter (Signed)
Patient is returning phone call from Friday

## 2023-12-31 ENCOUNTER — Telehealth: Payer: Self-pay | Admitting: Pharmacist

## 2023-12-31 MED ORDER — REPATHA SURECLICK 140 MG/ML ~~LOC~~ SOAJ: 1.0000 mL | SUBCUTANEOUS | 0 refills | Status: DC

## 2023-12-31 NOTE — Telephone Encounter (Signed)
 Patient presented 4/15 for Repatha training Reviewed injection technique and side effects Patient practiced with demo pen She was provided with sample Lot [id number] Exp 31/jul/26 Patient successfully injected Repatha 140mg  into her left abdomen She will give next injection 5/1 Provided with lab slip for labs after 5-6 injections

## 2023-12-31 NOTE — Addendum Note (Signed)
 Addended by: Angellynn Kimberlin D on: 12/31/2023 01:27 PM   Modules accepted: Orders

## 2024-01-08 ENCOUNTER — Other Ambulatory Visit: Payer: Self-pay

## 2024-01-08 DIAGNOSIS — J302 Other seasonal allergic rhinitis: Secondary | ICD-10-CM

## 2024-01-08 MED ORDER — MOMETASONE FUROATE 50 MCG/ACT NA SUSP: NASAL | 6 refills | Status: DC

## 2024-01-30 ENCOUNTER — Ambulatory Visit: Payer: Self-pay | Admitting: Psychology

## 2024-01-30 ENCOUNTER — Ambulatory Visit (INDEPENDENT_AMBULATORY_CARE_PROVIDER_SITE_OTHER): Admitting: Psychology

## 2024-01-30 DIAGNOSIS — R4189 Other symptoms and signs involving cognitive functions and awareness: Secondary | ICD-10-CM

## 2024-01-30 DIAGNOSIS — G3184 Mild cognitive impairment, so stated: Secondary | ICD-10-CM

## 2024-01-30 NOTE — Progress Notes (Signed)
   Psychometrician Note   Cognitive testing was administered to Aimee Davis by Arnulfo Larch, B.S. (psychometrist) under the supervision of Dr. Janice Meeter, Psy.D., licensed psychologist on 01/30/2024. [name] did not appear overtly distressed by the testing session per behavioral observation or responses across self-report questionnaires. Rest breaks were offered.    The battery of tests administered was selected by Dr. Janice Meeter, Psy.D. with consideration to [name]'s current level of functioning, the nature of her symptoms, emotional and behavioral responses during interview, level of literacy, observed level of motivation/effort, and the nature of the referral question. This battery was communicated to the psychometrist. Communication between Dr. Janice Meeter, Psy.D. and the psychometrist was ongoing throughout the evaluation and Dr. Janice Meeter, Psy.D. was immediately accessible at all times. Dr. Janice Meeter, Psy.D. provided supervision to the psychometrist on the date of this service to the extent necessary to assure the quality of all services provided.    Aimee Davis will return within approximately 1-2 weeks for an interactive feedback session with Dr. Donavon Fudge at which time her test performances, clinical impressions, and treatment recommendations will be reviewed in detail. [name] understands she can contact our office should she require our assistance before this time.  A total of 150 minutes of billable time were spent face-to-face with [name] by the psychometrist. This includes both test administration and scoring time. Billing for these services is reflected in the clinical report generated by Dr. Janice Meeter, Psy.D.  This note reflects time spent with the psychometrician and does not include test scores or any clinical interpretations made by Dr. Donavon Fudge. The full report will follow in a separate note.

## 2024-01-30 NOTE — Progress Notes (Unsigned)
 NEUROPSYCHOLOGICAL EVALUATION [place]. [facility]  [place] Department of Neurology  Date of Evaluation: 01/30/2024  REASON FOR REFERRAL   Aimee Davis is a 68 year old, right-handed, Black female with 12+ years of formal education (normed at 13 years of education, see Educational and Occupational History for more details). She was referred for neuropsychological evaluation by Tex Filbert, PA-C, to assess current neurocognitive functioning, document potential cognitive deficits, and assist with treatment planning. This is her first neuropsychological evaluation.  SUMMARY OF RESULTS   Premorbid cognitive abilities are estimated to be in the below average to low average range based on word reading and sociodemographic factors. Given this estimate, performance today was variable across most domains. Specifically, scores were low on tasks of rapid color naming, response inhibition and inhibition/switching, visual abstract reasoning, copying a complex figure (i.e., the approach was piecemeal and disorganized, leading to distortions of many details), confrontation naming, and learning/memory. Regarding the latter, she demonstrated difficulty encoding, recalling, and recognizing details of short stories. She similarly had difficulty recalling and recognizing a word list despite adequate immediate recall. She also demonstrated poor immediate and delayed recall of shapes, though her recognition of the shapes was relatively stronger. Remaining scores were within expectations, including on measures of attention/working memory, most measures of processing speed, alternating attention, verbal abstract reasoning, verbal fluency, block design construction, line orientation judgment, and fine motor dexterity.  On self-report questionnaires, she did not endorse clinically-significant symptoms of depression, anxiety, or excessive daytime sleepiness.  DIAGNOSTIC IMPRESSION   Results of the current  evaluation indicated variability across cognitive domains, with notable deficits in learning/memory, aspects of executive functioning, and confrontation naming. As functional independence is maintained, the most appropriate diagnosis at this time is mild cognitive impairment. Etiology of the deficits observed today is likely multifactorial, including mild cerebrovascular disease, persistent mood symptoms, sleep disturbance, and possible medication sensitivity/side effects. Patient recently discontinued some medications she believes were contributing to cognitive fogginess, though she was unable to specify which ones. She also noted an improvement in her sleep. Since these changes, she has experienced fewer episodes of mental fog, suggesting that better management of sleep and medications may support ongoing cognitive improvement. There is also a possibility of a longstanding learning disorder, as she reported lifelong difficulties with learning. Her history and aspects of her cognitive profile are consistent with this possibility. Finally, she performed poorly on a confrontation naming task and did not benefit from phonemic cueing. This raises the question of whether her naming difficulty reflects limited familiarity with certain words--possibly related to educational background--or reduced access to semantic knowledge, which could be an early marker of a neurodegenerative process such as Alzheimer's disease. In contrast, her performance on a semantic fluency task was within normal limits, which may argue against widespread semantic breakdown at this time. Given these naming difficulties in the context of learning/memory impairments, serial monitoring is warranted to rule out the possibility of an emerging neurodegenerative process.  ICD-10 Codes: G31.84 Mild cognitive impairment  RECOMMENDATIONS   A repeat neuropsychological evaluation in 12-18 months (or sooner if functional decline is noted) is  recommended.  Discuss with your neurologist the risks and benefits of starting a medication that can help slow memory decline.  Patient expressed interest in potentially pursuing counseling. Given her reported mood symptoms, cognitive changes, and some degree of social isolation, this could be a highly beneficial step for her.  Stay committed to sobriety. Keep up the excellent progress! Continue to regularly attend meetings and stay engaged  with your sponsor for ongoing support and encouragement.  Patient is encouraged to continue managing vascular risk factors through a heart healthy diet (e.g., MIND, Mediterranean), physician-approved physical activity, and medication adherence.  Aim to regularly participate in activities that you find enjoyable and fulfilling, whether that be hobbies, socializing with loved ones, or being outdoors. This can improve mood, increase motivation, and offer cognitive stimulation.  Given ongoing sleep difficulties, she may benefit from the implementation of sleep hygiene techniques, including:  -Go to bed and get up at the same time each day to help your body establish a regular rhythm.  -Establish and maintain a bedtime routine. Certain activities such as stretching, meditating, listening to soft music, or reading ~15 minutes before bedtime can be a great way to regularly get your brain and body ready for sleep.  -Avoid taking naps during the day.  -Avoid alcohol and caffeine for 5 or 6 hours before going to bed.  -Get regular exercise, but not in the hours before bedtime.  -Use comfortable bedding and maintain a cool temperature in your bedroom  -Block out light and distracting noise.  -Avoid watching television or using your phone/computer in bed.  -Avoid staying in bed if you have difficulty falling asleep. If you have not been able to get to sleep after about 20 minutes or more, get up and do something calming or boring until you feel sleepy, then  return to bed and try again.  Consider implementing compensatory strategies to maximize independence and maintain daily functioning. Examples include:   -Adhere to routine. Compensatory strategies work best when they are used consistently. Use a planner, calendar, or white board that has the schedule and important events for the day clearly listed to reference and cross off when tasks are complete.   -Ask for written information, especially if it is new or unfamiliar (e.g., information provided at a doctor's appointment).   -Create an organized environment. Keep items that can be easily misplaced in a sensible location and get into the habit of always returning the items to those places.   -Pay attention and reduce distractions. Make a point of focusing attention on information you want to remember. One-on-one interaction is more likely to facilitate attention and minimize distraction. Make eye contact and repeat the information out loud after you hear it. Reduce interruptions or distractions especially when attempting to learn new information.   -Create associations. When learning something new, think about and understand the information. Explain it in your own words or try to associate it with something you already know. Take notes to help remember important details.  -Evaluate goals and plan accordingly. When confronted by many different tasks, begin by making a list that prioritizes each task and estimates the time it will take to complete. Break down complicated tasks into smaller, more manageable steps.   -Focus on one task at a time and complete each task before starting another. Avoid multitasking.  DISPOSITION   Patient will follow up with the referring provider, Ms. Wertman. Patient should return for repeat neuropsychological testing in 12-18 months to monitor her course and assist with diagnosis and treatment planning. She will be provided verbal feedback in approximately one week  regarding the findings and impression during this visit.  The remainder of the report includes the details of the patient's background and a table of results from the current evaluation, which support the summary and recommendations described above.  BACKGROUND   History of Presenting Illness: The following information was obtained  from a review of medical records and an interview with the patient. Patient established care with neurology on 08/29/2023 following an episode of confusion that occurred in November 2024. Per ED notes, the patient reported recent episodes of confusion, such as doing an activity, then forgetting how to do it; cooking food, then returning to find raw food on the stove; and forgetting directions while driving. The only lab abnormality considered possibly related to her symptoms was an elevated TSH reading. Her confusion was thought to potentially result from depression or sleep disturbance, both of which she endorsed. EEG was completed on 09/03/2023; result was normal. During neurology visits, the patient also reported memory difficulties since having COVID in 2022. At her initial appointment, MoCA = 20/30; at her most recent visit on 11/28/2023, MMSE = 29/30. Patient was referred for neuropsychological evaluation accordingly.  Cognitive Functioning: During today's appointment, the patient reported first noticing cognitive changes following a COVID infection in 2022; she did not require a ventilator at the time. Since then, her cognitive symptoms have been highly variable--some days she feels clearheaded, while other days she does not. She believes these "ups and downs" may be related to her sensitivity to medications, as she has noticed some improvement after changes to her medication regimen, though she was unable to specify which medications were changed. She further discussed the episode in November 2024 when she went to the ED for altered mental status, stating that she believes  those symptoms were medication-related. Specific cognitive concerns endorsed during today's appointment included distractibility, slowed processing speed, misplacing items, word-finding difficulties, disorganization, and increased effort required for planning. She remarked, "I think I can get better if I can organize my week," indicating a sense of mental scatter.  Physical Functioning: Patient reported improvement in her insomnia and believes that much of her sleep issues are due to not being active enough during the day. Appetite is stable. No changes to sense of taste or smell were reported. Vision and hearing are stable. She endorsed occasional balance difficulties during episodes of irritable bowel syndrome but denied any ongoing balance issues or falls. She also denied tremors.  Emotional Functioning: Patient described her recent mood as "sometimes anxious" but stated that today she feels good. She did not report feeling significantly depressed or experiencing suicidal ideation. She makes an effort to stay engaged by attending sobriety meetings and participating in activities at the local community center, but she finds socializing challenging due to difficulty initiating new friendships. Her best friend moved further away, making it harder for them to communicate regularly. She also lives alone. As such, she expressed interest in potentially seeing a counselor to help with initiating social interactions. Additionally, she expressed a desire to more regularly go on walks and exercise at the gym.  Imaging: MRI of the brain (09/13/2023) documented mild multifocal T2 flair hyperintense signal abnormality within the cerebral white matter, nonspecific but most often secondary to chronic small vessel ischemia. Findings have progressed since prior brain MRI of 12/31/2019.  Other Relevant Medical History: Remarkable for restless leg syndrome, hyperlipidemia, coronary artery disease, chronic obstructive  pulmonary disease, irritable bowel syndrome, and gastroesophageal reflux disease. No history of stroke, CNS infection, head injury, or seizure was reported.  Current Medications: Per record, acetaminophen , albuterol , artificial tear ointment, aspirin , Breztri  Aerosphere, Optive, cromolyn, diclofenac  sodium, diltiazem , evolocumab , ezetimibe , fexofenadine , mometasone , multiple vitamins-minerals, pantoprazole , vitamin C, and vitamin D3. Patient reported that she is not taking rosuvastatin  at this time due to significant leg cramps/pain.  Functional  Status: Patient independently performs all basic and instrumental activities of daily living. She continues to drive independently without getting lost and denies any recent accidents or traffic violations. She manages her medications accurately and handles her finances well, generally paying her bills over the phone, though she infrequently may forget a bill. She is able to operate household appliances without difficulty.  Family Neurological History: Remarkable for unspecified dementia (maternal grandmother, maternal great aunt).  Psychiatric History: History of depression, anxiety, prior mental health treatment, suicidal ideation, hallucinations, and psychiatric hospitalizations was not reported.  Substance Use History: Patient denied current use of alcohol, nicotine , marijuana, and illicit substances. She will celebrate 31 years of sobriety from alcohol and cocaine this August and continues to regularly attend meetings while staying connected with her sponsor. She quit smoking cigarettes approximately 17 years ago.  Social and Developmental History: Patient was born in [place], [place]. History of perinatal complications and developmental delays was not reported. Patient is divorced and has two children. She lives alone.  Educational and Occupational History: Patient described having difficulty with learning (i.e., "slow at learning) but was unsure if she had  been in special education classes. She reported that her grades were typically Bs and Cs, with occasional Ds. She does not have a history of grade repetition. She graduated from high school on time and then attended community college part-time for three years, studying Company secretary, while also volunteering at daycare facilities. 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 She assisted with various tasks, including behavior management, homework support, and more. She has since retired.  BEHAVIORAL OBSERVATIONS   Patient arrived 15 minutes late and was unaccompanied. She ambulated independently. Gait appeared antalgic. She was alert and fully oriented. She was appropriately groomed and dressed for the setting. No significant sensory or motor abnormalities were observed. Vision (with glasses) and hearing were adequate for testing purposes. Speech was of normal rate, prosody, and volume. She mispronounced several words throughout the evaluation, though it is unclear whether this is primarily related to her educational background. No conversational word-finding difficulties, paraphasic errors, or dysarthria were observed. Comprehension was conversationally intact. Thought processes were linear, logical, and coherent. Thought content was organized and devoid of delusions. Insight appeared appropriate. Affect was even and congruent with mood. She was cooperative and gave adequate effort during testing, including on standalone and embedded measures of performance validity. Results are thought to accurately reflect her cognitive functioning at this time.  NEUROPSYCHOLOGICAL TESTING RESULTS   Tests Administered: Animal Naming Test; Beck Anxiety Inventory (BAI); Beck Depression Inventory II (BDI-II); Brief Visuospatial Memory Test-Revised (BVMT-R) - Form 1; Controlled Oral Word  Association Test (COWAT): FAS; Delis-Kaplan Air cabin crew System (D-KEFS) - Subtest(s): Color-Word Interference Test; Epworth Sleepiness Scale (ESS); Grooved Pegboard Test; [employer] Learning Test Revised (HVLT-R) - From 1; Judgment of Line Orientation (JLO) - Form H; Neuropsychological Assessment Battery (NAB) - Subtest(s): Naming Form 1; Rey Complex Figure Test (RCFT) - Copy only; Standalone performance validity test (PVT); Test of Premorbid Functioning (TOPF); Trail Making Test (TMT); Wechsler Adult Intelligence Scale Fifth Edition (WAIS-5) - Subtest(s): Similarities, Clinical cytogeneticist, Matrix Reasoning, Digit Sequencing, Coding, Running Digits, Symbol Search, Symbol Span; and Wechsler Memory Scale Fourth Edition (WMS-IV) - Subtest(s): Logical Memory (LM).  Test results are provided in the table below. Whenever possible, the patient's scores were compared against age-, sex-, and education-corrected normative  samples. Interpretive descriptions are based on the AACN consensus conference statement on uniform labeling (Guilmette et al., 2020).  PREMORBID FUNCTIONING RAW  RANGE  TOPF 15 StdS=75 Below Average  ATTENTION & WORKING MEMORY RAW  RANGE  WAIS-5 Digit Sequencing -- ss=7 Low Average  WAIS-5 Running Digits -- ss=8 Average  WAIS-5 Symbol Span -- ss=7 Low Average  PROCESSING SPEED RAW  RANGE  Trails A 31''0e T=56 Average  WAIS-5 Coding  -- ss=6 Low Average  WAIS-5 Symbol Search -- ss=7 Low Average  DKEFS CWIT Color Naming 57''4e ss=1 Exceptionally Low  DKEFS CWIT Word Reading 31''4e ss=7 Low Average  EXECUTIVE FUNCTION RAW  RANGE  Trails B 98''0e T=53 Average  WAIS-5 Matrix Reasoning -- ss=5 Below Average  WAIS-5 Similarities -- ss=6 Low Average  COWAT Letter Fluency 10+5+10 T=40 Low Average  DKEFS CWIT Inhibition 123''17e ss=1 Exceptionally Low  DKEFS CWIT Inhibition/Switching 105''8e ss=6 Low Average  LANGUAGE RAW  RANGE  COWAT Letter Fluency 10+5+10 T=40 Low Average  Animal Naming  Test 13 T=43 Average  NAB Naming Test 21/31 +1/10 with PC T=19 BNL  VISUOSPATIAL RAW    WAIS-5 Block Design -- ss=6 Low Average  JLO C/S=21/30 22%ile Low Average  RCFT Copy 17/36 <1 Exceptionally Low  BVMT-R Copy Trial 11/12 -- WNL  LEARNING & MEMORY RAW  RANGE  HVLT Learning Trials (4+7+8)/36 T=41 Low Average  HVLT Delayed Recall 3/12 T=30 Below Average  HVLT Recognition Hits 10 -- --  HVLT Recognition False Positives 2 -- --  HVLT Discrimination Index 8 T=36 Below Average  WMS-IV LM-I  (2+8+6)/53 ss=4 Below Average  WMS-IV LM-II  (2+6)/39 ss=5 Below Average  WMS-IV LM Recognition  (6+8)/23 <2%ile Exceptionally Low  BVMT-R Total Recall (3+3+4)/36 T=30 Below Average  BVMT-R Delayed Recall 3/12 T=28 Exceptionally Low  BVMT-R Percent Retained 75 11-16%ile Low Average  BVMT-R Recognition Hits 6 >16%ile WNL  BVMT-R Recognition False Alarms 2 <1%ile Exceptionally Low  BVMT-R Recognition Discrimination Index 4 11-16%ile Low Average  FINE MOTOR DEXTERITY RAW  RANGE  Grooved Pegboard (Dominant Hand) 104''1d T=41 Low Average  Grooved Pegboard (Non-Dominant Hand) 100''0d T=52 Average  QUESTIONNAIRES RAW  RANGE  BDI 5 -- Minimal  BAI 6 -- Minimal  ESS 2 -- WNL  *Note: ss = scaled score; StdS = standard score; T = t-score; C/S = corrected raw score; WNL = within normal limits; BNL= below normal limits; D/C = discontinued. Scores from skewed distributions are typically interpreted as WNL (>=16th %ile) or BNL (<16th %ile).   INFORMED CONSENT   Patient was provided with a verbal description of the nature and purpose of the neuropsychological evaluation. Also reviewed were the foreseeable risks and/or discomforts and benefits of the procedure, limits of confidentiality, and mandatory reporting requirements of this provider. Patient was given the opportunity to have their questions answered. Oral consent to participate was provided by the patient.   This report was prepared as part of a clinical  evaluation and is not intended for forensic use.  SERVICE   This evaluation was conducted by Janice Meeter, Psy.D. In addition to time spent directly with the patient, total professional time (180 minutes) includes record review, integration of relevant medical history, test selection, interpretation of findings, and report preparation. A technician, Arnulfo Larch, B.S., provided testing and scoring assistance for 150 minutes.  Psychiatric Diagnostic Evaluation Services (Professional): [id number] x 1 Neuropsychological Testing Evaluation Services (Professional): [id number] x 1 Neuropsychological Testing Evaluation Services (Professional): [id number] x 2 Neuropsychological Test Administration and Scoring (  Technician): [phone] x 1 Neuropsychological Test Administration and Scoring (Technician): [phone] x 4  This report was generated using voice recognition software. While this document has been carefully reviewed, transcription errors may be present. I apologize in advance for any inconvenience. Please contact me if further clarification is needed.            Janice Meeter, Psy.D.             Neuropsychologist

## 2024-02-07 ENCOUNTER — Ambulatory Visit (INDEPENDENT_AMBULATORY_CARE_PROVIDER_SITE_OTHER): Admitting: Psychology

## 2024-02-07 DIAGNOSIS — G3184 Mild cognitive impairment, so stated: Secondary | ICD-10-CM

## 2024-02-07 NOTE — Progress Notes (Signed)
   NEUROPSYCHOLOGY FEEDBACK SESSION [facility]  [place] Department of Neurology  Date of Feedback Session: 02/07/2024  REASON FOR REFERRAL   Aimee Davis is a 68 year old, right-handed, Black female with 12+ years of formal education (normed at 13 years of education, see Educational and Occupational History for more details). She was referred for neuropsychological evaluation by Tex Filbert, PA-C, to assess current neurocognitive functioning, document potential cognitive deficits, and assist with treatment planning. This is her first neuropsychological evaluation.  FEEDBACK   Patient completed a comprehensive neuropsychological evaluation on 01/30/2024. Please refer to that encounter for the full report and recommendations. Briefly, results indicated variability across cognitive domains, with notable deficits in learning/memory, aspects of executive functioning, and confrontation naming. As functional independence is maintained, the most appropriate diagnosis at this time is mild cognitive impairment. Etiology of the deficits observed today is likely multifactorial, including mild cerebrovascular disease, persistent mood symptoms, sleep disturbance, and possible medication sensitivity/side effects. Patient recently discontinued some medications she believes were contributing to cognitive fogginess, though she was unable to specify which ones. She also noted an improvement in her sleep. Since these changes, she has experienced fewer episodes of mental fog, suggesting that better management of sleep and medications may support ongoing cognitive improvement. There is also a possibility of a longstanding learning disorder, as she reported lifelong difficulties with learning. Her history and aspects of her cognitive profile are consistent with this possibility. Finally, she performed poorly on a confrontation naming task and did not benefit from phonemic cueing. This raises the  question of whether her naming difficulty reflects limited familiarity with certain words--possibly related to educational background--or reduced access to semantic knowledge, which could be an early marker of a neurodegenerative process such as Alzheimer's disease. In contrast, her performance on a semantic fluency task was within normal limits, which may argue against widespread semantic breakdown at this time. Given these naming difficulties in the context of learning/memory impairments, serial monitoring is warranted to rule out the possibility of an emerging neurodegenerative process.   Today, the patient was unaccompanied. She was provided verbal feedback regarding the findings and impression during this visit, and her questions were answered. A copy of the report was provided at the conclusion of the visit.  DISPOSITION   Patient will follow up with the referring provider, Ms. Wertman. She should return for repeat neuropsychological testing in 12-18 months to monitor her course and assist with diagnosis and treatment planning.  SERVICE   This feedback session was conducted by Janice Meeter, Psy.D. [place] unit of 11914 (40 minutes) was billed for Dr. Donavon Fudge' time spent in preparing, conducting, and documenting the current feedback session.  This report was generated using voice recognition software. While this document has been carefully reviewed, transcription errors may be present. I apologize in advance for any inconvenience. Please contact me if further clarification is needed.

## 2024-02-12 ENCOUNTER — Ambulatory Visit: Admitting: Physician Assistant

## 2024-02-12 ENCOUNTER — Telehealth: Payer: Self-pay | Admitting: Physician Assistant

## 2024-02-12 NOTE — Progress Notes (Incomplete)
 Assessment/Plan:    Mild cognitive impairment, likely multifactorial  Aimee Davis is a very pleasant 68 y.o. RH female with extensive medical history including hypertension, hyperlipidemia, asthma, arthritis, cataracts, COPD-emphysema, GERD, insomnia, anxiety, hypothyroidism, possible ADD, and sleep apnea, presenting today in follow-up for evaluation of memory loss.  She had a neuropsych evaluation 01/30/2024 with a diagnosis of mild cognitive impairment, likely multifactorial, including cerebrovascular disease, persistent mood symptoms, sleep disturbance and possible medication sensitivity due to side effects and the possibility of longstanding learning disorder.  Discussed initiating anticholinergic medication, patient agrees to proceed.***.     Recommendations:   Follow up in   months. Repeat neuropsych evaluation in 12 to 18 months for diagnostic clarity Start donepezil 5 mg daily, side effects discussed*** Follow-up COPD and possible OSA with pulmonary Recommend good control of cardiovascular risk factors Continue to control mood as per PCP    Subjective:   This patient is accompanied in the office by ***  who supplements the history. Previous records as well as any outside records available were reviewed prior to todays visit.   Patient was last seen on 11/28/2023 with MMSE 29/30***.    Any changes in memory since last visit? ".  She continues to report some difficulty remembering information, conversations, names.  LTM is good repeats oneself?  Endorsed Disoriented when walking into a room?  Patient denies ***  Misplacing objects?  Patient denies   Wandering behavior?   Denies. Any personality changes since last visit? Denies.  She has a history of anxiety, needs to be surrounded by family most of the time, otherwise she does not feel safe. Any worsening depression?: denies.   Hallucinations or paranoia?  Denies.   Seizures?   Denies.    Any sleep changes? Sleeps  well***. Does not sleep very well***.   Denies vivid dreams, REM behavior or sleepwalking   Sleep apnea?   denies ***  Any hygiene concerns?   Denies.   Independent of bathing and dressing?  Endorsed  Does the patient needs help with medications? Patient is in charge *** Who is in charge of the finances?  Patient is in charge   *** Any changes in appetite?  denies ***   Patient have trouble swallowing?  Denies.   Does the patient cook?  Yes, denies forgetting any common recipes any kitchen accidents such as leaving the stove on?   Denies.   Any headaches?    Denies.   Vision changes? Denies. Chronic pain?  Denies.   Ambulates with difficulty?    Denies. ***  Recent falls or head injuries?    Denies.      Unilateral weakness, numbness or tingling?  Denies.   Any tremors?  "Only when I am anxious ".  She denies any other parkinsonian symptoms. Any anosmia?    Denies.   Any incontinence of urine?  Denies.   Any bowel dysfunction?  She has IBS constipation      Patient lives alone.*** Does the patient drive?  Yes, she denies any recent issues***  Neuropsych evaluation 01/30/2024 Dr. Donavon Fudge Briefly, results indicated variability across cognitive domains, with notable deficits in learning/memory, aspects of executive functioning, and confrontation naming. As functional independence is maintained, the most appropriate diagnosis at this time is mild cognitive impairment. Etiology of the deficits observed today is likely multifactorial, including mild cerebrovascular disease, persistent mood symptoms, sleep disturbance, and possible medication sensitivity/side effects. Patient recently discontinued some medications she believes were contributing to cognitive fogginess, though  she was unable to specify which ones. She also noted an improvement in her sleep. Since these changes, she has experienced fewer episodes of mental fog, suggesting that better management of sleep and medications may support ongoing  cognitive improvement. There is also a possibility of a longstanding learning disorder, as she reported lifelong difficulties with learning. Her history and aspects of her cognitive profile are consistent with this possibility. Finally, she performed poorly on a confrontation naming task and did not benefit from phonemic cueing. This raises the question of whether her naming difficulty reflects limited familiarity with certain words--possibly related to educational background--or reduced access to semantic knowledge, which could be an early marker of a neurodegenerative process such as Alzheimer's disease. In contrast, her performance on a semantic fluency task was within normal limits, which may argue against widespread semantic breakdown at this time. Given these naming difficulties in the context of learning/memory impairments, serial monitoring is warranted to rule out the possibility of an emerging neurodegenerative process.   Past Medical History:  Diagnosis Date   Abdominal pain 08/16/2008   Qualifier: Diagnosis of  By: Gaylyn Keas FNP, [name]     Abdominal pain, epigastric 09/08/2019   Allergy    seasonal, PCN, antidepressants, bentyl    Aortic atherosclerosis (HCC) 02/23/2021   Arthritis    Asthma    Asymptomatic bacteriuria 08/24/2021   UA showed leukocytes but patient does not have dysuria, fevers or hematuria. She also had bacteruria in her last UA. Her symptoms of fatigue, and loss of appetite are non-specific but if persist or she begins having dysuria we can work up further. A UTI would not explain the abdominal pain patient is having.    Carpal tunnel syndrome on both sides    right worse than left 2008   Cataract 06/11/2013   COPD (chronic obstructive pulmonary disease) (HCC)    COVID-19 virus infection 06/18/2016   Eczema 05/02/2018   Food poisoning 04/04/2021   GERD (gastroesophageal reflux disease)    Hair loss 07/07/2019   Hyperlipidemia    LDL 108 JULY 2013   IBS (irritable bowel  syndrome)    Macular rash 05/20/2019   Multiple nevi 03/01/2015   Pneumonia    Positive TB test    From [facility] 2009 note : Recent F[facility]u at [facility]. CXRAy negative.  Positive TST in 2007 (14mm).  Unable to tolerate INH due to hepatotoxicity   Primary insomnia 06/05/2012   Skin macule 04/21/2019   Tinnitus 07/07/2019   URI (upper respiratory infection) 11/01/2010   Qualifier: Diagnosis of  By: Gaylyn Keas FNP, Davene Ernst       Past Surgical History:  Procedure Laterality Date   BREAST CYST EXCISION Right 2011   BREAST EXCISIONAL BIOPSY     benign   BREAST EXCISIONAL BIOPSY Right 1990   benign   INDUCED ABORTION  2005   VAGINAL HYSTERECTOMY  1983   partial; for abnormal bleeding     PREVIOUS MEDICATIONS:   CURRENT MEDICATIONS:  Outpatient Encounter Medications as of 02/12/2024  Medication Sig   albuterol  (VENTOLIN  HFA) 108 (90 Base) MCG/ACT inhaler Inhale 1 puff into the lungs every 6 (six) hours as needed for wheezing or shortness of breath. USE 2 INHALATIONS BY MOUTH EVERY 6 HOURS AS NEEDED FOR WHEEZING  OR SHORTNESS OF BREATH   Artificial Tear Ointment (DRY EYES OP) Apply 1 drop to eye daily as needed (dry eyes).   Ascorbic Acid (VITAMIN C) 100 MG tablet Take 50 mg by mouth daily.   aspirin  EC  81 MG tablet Take 1 tablet (81 mg total) by mouth daily. Swallow whole.   Budeson-Glycopyrrol-Formoterol  (BREZTRI  AEROSPHERE) 160-9-4.8 MCG/ACT AERO Inhale 2 puffs into the lungs in the morning and at bedtime.   carboxymethylcellul-glycerin (OPTIVE) 0.5-0.9 % ophthalmic solution 1 drop 4 (four) times daily as needed for dry eyes.   cholecalciferol  (VITAMIN D3) 25 MCG (1000 UNIT) tablet Take 1 tablet (1,000 Units total) by mouth daily.   cromolyn (OPTICROM) 4 % ophthalmic solution Place 1 drop into both eyes 2 (two) times daily.   diltiazem  (CARDIZEM ) 30 MG tablet Take 1 tablet (30 mg total) by mouth 2 (two) times daily as needed (For palpitations).   Evolocumab  (REPATHA  SURECLICK) 140 MG/ML SOAJ Inject 140 mg  into the skin every 14 (fourteen) days.   Evolocumab  (REPATHA  SURECLICK) 140 MG/ML SOAJ Inject 140 mg into the skin every 14 (fourteen) days.   ezetimibe  (ZETIA ) 10 MG tablet Take 1 tablet (10 mg total) by mouth daily.   fexofenadine  (ALLEGRA ) 180 MG tablet Take 1 tablet (180 mg total) by mouth daily.   mometasone  (NASONEX ) 50 MCG/ACT nasal spray PLACE 2 SPRAYS IN EACH NOSTRIL ONCE DAILY AS NEEDED.   Multiple Vitamins-Minerals (MULTIVITAMIN WITH MINERALS) tablet Take 1 tablet by mouth daily.   pantoprazole  (PROTONIX ) 40 MG tablet Take 1 tablet (40 mg total) by mouth 2 (two) times daily before a meal for 15 days.   rosuvastatin  (CRESTOR ) 10 MG tablet Take 1 tablet (10 mg total) by mouth daily.   Facility-Administered Encounter Medications as of 02/12/2024  Medication   acetaminophen  (TYLENOL ) tablet 1,000 mg   diclofenac  Sodium (VOLTAREN ) 1 % topical gel 4 g     Objective:     PHYSICAL EXAMINATION:    VITALS:  There were no vitals filed for this visit.  GEN:  The patient appears stated age and is in NAD. HEENT:  Normocephalic, atraumatic.   Neurological examination:  General: NAD, well-groomed, appears stated age. Orientation: The patient is alert. Oriented to person, place and not to date.*** Cranial nerves: There is good facial symmetry.anxious appearing.  The speech is fluent and clear. No aphasia or dysarthria. Fund of knowledge is appropriate. Recent memory impaired and remote memory is normal.  Attention and concentration are normal.  Able to name objects and repeat phrases.  Hearing is intact to conversational tone ***.   Delayed recall *** Sensation: Sensation is intact to light touch throughout Motor: Strength is at least antigravity x4. DTR's 2/4 in UE/LE      08/29/2023    1:00 PM  Montreal Cognitive Assessment   Visuospatial/ Executive (0/5) 3  Naming (0/3) 2  Attention: Read list of digits (0/2) 2  Attention: Read list of letters (0/1) 1  Attention: Serial 7  subtraction starting at 100 (0/3) 1  Language: Repeat phrase (0/2) 1  Language : Fluency (0/1) 1  Abstraction (0/2) 0  Delayed Recall (0/5) 2  Orientation (0/6) 6  Total 19  Adjusted Score (based on education) 20        No data to display             Movement examination: Tone: There is normal tone in the UE/LE Abnormal movements:  no tremor.  No myoclonus.  No asterixis.   Coordination:  There is no decremation with RAM's. Normal finger to nose  Gait and Station: The patient has no difficulty arising out of a deep-seated chair without the use of the hands. The patient's stride length is good.  Gait is  cautious and narrow.   Thank you for allowing us  the opportunity to participate in the care of this nice patient. Please do not hesitate to contact us  for any questions or concerns.   Total time spent on today's visit was *** minutes dedicated to this patient today, preparing to see patient, examining the patient, ordering tests and/or medications and counseling the patient, documenting clinical information in the EHR or other health record, independently interpreting results and communicating results to the patient/family, discussing treatment and goals, answering patient's questions and coordinating care.  Cc:  Jonelle Neri, DO  Abraham Hoffmann [facility] 02/12/2024 6:19 AM

## 2024-02-12 NOTE — Telephone Encounter (Signed)
 Patient missed her appt for today due to her not feeling well and wants to speak to someone about her medication

## 2024-02-12 NOTE — Telephone Encounter (Signed)
 Pt called back in returning Christy's call. Pt stated she wants to talk to Abraham Hoffmann about why she wasn't able to come today, but would not provide me with any details.

## 2024-02-12 NOTE — Telephone Encounter (Signed)
 No answer, when she calls back please ask what she needs thanks.

## 2024-02-13 NOTE — Telephone Encounter (Signed)
 No answer again, will send my chart message.

## 2024-02-19 NOTE — Telephone Encounter (Signed)
 I left message to call office or send my chart message at 1:35 02/19/2024 to see what concerns patient had

## 2024-02-19 NOTE — Telephone Encounter (Signed)
 Pt called AN wanting to speak with christy/sara

## 2024-02-20 ENCOUNTER — Other Ambulatory Visit: Payer: Self-pay | Admitting: Cardiology

## 2024-02-20 ENCOUNTER — Telehealth: Payer: Self-pay | Admitting: Student

## 2024-02-20 ENCOUNTER — Ambulatory Visit: Admitting: Dietician

## 2024-02-20 VITALS — BP 124/78 | HR 89 | Temp 98.2°F | Ht 65.0 in | Wt 149.0 lb

## 2024-02-20 DIAGNOSIS — R5382 Chronic fatigue, unspecified: Secondary | ICD-10-CM

## 2024-02-20 DIAGNOSIS — R4189 Other symptoms and signs involving cognitive functions and awareness: Secondary | ICD-10-CM

## 2024-02-20 DIAGNOSIS — E559 Vitamin D deficiency, unspecified: Secondary | ICD-10-CM | POA: Diagnosis not present

## 2024-02-20 DIAGNOSIS — G479 Sleep disorder, unspecified: Secondary | ICD-10-CM | POA: Diagnosis not present

## 2024-02-20 DIAGNOSIS — F419 Anxiety disorder, unspecified: Secondary | ICD-10-CM

## 2024-02-20 DIAGNOSIS — R7989 Other specified abnormal findings of blood chemistry: Secondary | ICD-10-CM | POA: Diagnosis not present

## 2024-02-20 NOTE — Patient Instructions (Addendum)
 Thank you, Ms.Seferina C Kofman for allowing us  to provide your care today. Today we discussed   -Referral to therapy/psychology  -Blood work today to check thyroid  levels -Work on sleep hygiene, avoid phone or tv close to bedtime, avoid late night meals -Stop herbal supplement for now   Elayne Greaser, MD Gastroenterology [facility] [address] [place] [address]  Follow up: 2-4 weeks   Should you have any questions or concerns please call the internal medicine clinic at [phone].    Artrice Kraker, D.O. [facility]

## 2024-02-20 NOTE — Progress Notes (Unsigned)
 CC: acute visit, brain fog  HPI:  Aimee Davis is a 68 y.o. female living with a history stated below and presents today for acute visit. Please see problem based assessment and plan for additional details.  Past Medical History:  Diagnosis Date   Abdominal pain 08/16/2008   Qualifier: Diagnosis of  By: Gaylyn Keas FNP, [name]     Abdominal pain, epigastric 09/08/2019   Allergy    seasonal, PCN, antidepressants, bentyl    Aortic atherosclerosis (HCC) 02/23/2021   Arthritis    Asthma    Asymptomatic bacteriuria 08/24/2021   UA showed leukocytes but patient does not have dysuria, fevers or hematuria. She also had bacteruria in her last UA. Her symptoms of fatigue, and loss of appetite are non-specific but if persist or she begins having dysuria we can work up further. A UTI would not explain the abdominal pain patient is having.    Carpal tunnel syndrome on both sides    right worse than left 2008   Cataract 06/11/2013   COPD (chronic obstructive pulmonary disease) (HCC)    COVID-19 virus infection 06/18/2016   Eczema 05/02/2018   Food poisoning 04/04/2021   GERD (gastroesophageal reflux disease)    Hair loss 07/07/2019   Hyperlipidemia    LDL 108 JULY 2013   IBS (irritable bowel syndrome)    Macular rash 05/20/2019   Multiple nevi 03/01/2015   Pneumonia    Positive TB test    From MArch 2009 note : Recent F/u at [facility]. CXRAy negative.  Positive TST in 2007 (14mm).  Unable to tolerate INH due to hepatotoxicity   Primary insomnia 06/05/2012   Skin macule 04/21/2019   Tinnitus 07/07/2019   URI (upper respiratory infection) 11/01/2010   Qualifier: Diagnosis of  By: Gaylyn Keas FNP, Davene Ernst      Current Outpatient Medications on File Prior to Visit  Medication Sig Dispense Refill   albuterol  (VENTOLIN  HFA) 108 (90 Base) MCG/ACT inhaler Inhale 1 puff into the lungs every 6 (six) hours as needed for wheezing or shortness of breath. USE 2 INHALATIONS BY MOUTH EVERY 6 HOURS AS NEEDED FOR  WHEEZING  OR SHORTNESS OF BREATH 26.8 g 2   Artificial Tear Ointment (DRY EYES OP) Apply 1 drop to eye daily as needed (dry eyes).     Ascorbic Acid (VITAMIN C) 100 MG tablet Take 50 mg by mouth daily.     aspirin  EC 81 MG tablet Take 1 tablet (81 mg total) by mouth daily. Swallow whole. 90 tablet 3   Budeson-Glycopyrrol-Formoterol  (BREZTRI  AEROSPHERE) 160-9-4.8 MCG/ACT AERO Inhale 2 puffs into the lungs in the morning and at bedtime. 1 each 11   carboxymethylcellul-glycerin (OPTIVE) 0.5-0.9 % ophthalmic solution 1 drop 4 (four) times daily as needed for dry eyes.     cholecalciferol  (VITAMIN D3) 25 MCG (1000 UNIT) tablet Take 1 tablet (1,000 Units total) by mouth daily. 60 tablet 1   cromolyn (OPTICROM) 4 % ophthalmic solution Place 1 drop into both eyes 2 (two) times daily.     diltiazem  (CARDIZEM ) 30 MG tablet Take 1 tablet (30 mg total) by mouth 2 (two) times daily as needed (For palpitations). 30 tablet 6   Evolocumab  (REPATHA  SURECLICK) 140 MG/ML SOAJ Inject 140 mg into the skin every 14 (fourteen) days. 6 mL 3   Evolocumab  (REPATHA  SURECLICK) 140 MG/ML SOAJ Inject 140 mg into the skin every 14 (fourteen) days. 1 mL 0   ezetimibe  (ZETIA ) 10 MG tablet Take 1 tablet (10 mg total) by mouth  daily. 90 tablet 0   fexofenadine  (ALLEGRA ) 180 MG tablet Take 1 tablet (180 mg total) by mouth daily. 90 tablet 1   mometasone  (NASONEX ) 50 MCG/ACT nasal spray PLACE 2 SPRAYS IN EACH NOSTRIL ONCE DAILY AS NEEDED. 17 g 6   Multiple Vitamins-Minerals (MULTIVITAMIN WITH MINERALS) tablet Take 1 tablet by mouth daily.     pantoprazole  (PROTONIX ) 40 MG tablet Take 1 tablet (40 mg total) by mouth 2 (two) times daily before a meal for 15 days. 30 tablet 0   rosuvastatin  (CRESTOR ) 10 MG tablet Take 1 tablet (10 mg total) by mouth daily.     Current Facility-Administered Medications on File Prior to Visit  Medication Dose Route Frequency Provider Last Rate Last Admin   acetaminophen  (TYLENOL ) tablet 1,000 mg  1,000  mg Oral Once        diclofenac  Sodium (VOLTAREN ) 1 % topical gel 4 g  4 g Topical Daily PRN         Family History  Problem Relation Age of Onset   Hypertension Mother    Heart disease Mother    Diabetes Mother    Arthritis Mother    Stroke Father    Diabetes Father    Heart disease Father    Hyperlipidemia Father    Hypertension Father    Kidney disease Father    Hypertension Sister    Breast cancer Sister    Breast cancer Sister    Hypertension Sister    Arthritis Sister    Hypertension Sister    Diabetes Sister    Mental illness Brother    Aneurysm Brother        brain   Arthritis Brother    Hypertension Maternal Aunt    Hypertension Maternal Uncle    Stomach cancer Maternal Uncle    Hypertension Paternal Aunt    Kidney disease Paternal Aunt        x 2   Hypertension Paternal Uncle    Kidney disease Paternal Uncle        x 2   Colon cancer Neg Hx    Esophageal cancer Neg Hx    Pancreatic cancer Neg Hx     Social History   Socioeconomic History   Marital status: Divorced    Spouse name: Not on file   Number of children: 2   Years of education: Not on file   Highest education level: Some college, no degree  Occupational History   Occupation: retired  Tobacco Use   Smoking status: Former    Current packs/day: 0.00    Average packs/day: 0.3 packs/day for 43.0 years (14.2 ttl pk-yrs)    Types: Cigarettes    Start date: 01/06/1970    Quit date: 01/06/2013    Years since quitting: 11.1   Smokeless tobacco: Never  Vaping Use   Vaping status: Never Used  Substance and Sexual Activity   Alcohol use: No    Alcohol/week: 0.0 standard drinks of alcohol   Drug use: Not Currently    Comment: x 26 yrs.   Sexual activity: Not on file    Comment: S/P PARTIAL HYSTERECTOMY  Other Topics Concern   Not on file  Social History Narrative   Right handed   Drinks caffeine prn   Lives alone   Retired   One floor home   Social Drivers of Health   Financial  Resource Strain: Low Risk  (03/05/2023)   Overall Financial Resource Strain (CARDIA)    Difficulty of Paying Living Expenses:  Not very hard  Food Insecurity: No Food Insecurity (03/05/2023)   Hunger Vital Sign    Worried About Running Out of Food in the Last Year: Never true    Ran Out of Food in the Last Year: Never true  Transportation Needs: No Transportation Needs (03/05/2023)   PRAPARE - Administrator, Civil Service (Medical): No    Lack of Transportation (Non-Medical): No  Physical Activity: Insufficiently Active (03/05/2023)   Exercise Vital Sign    Days of Exercise per Week: 2 days    Minutes of Exercise per Session: 30 min  Stress: No Stress Concern Present (03/05/2023)   [employer] of Occupational Health - Occupational Stress Questionnaire    Feeling of Stress : Only a little  Social Connections: Unknown (03/05/2023)   Social Connection and Isolation Panel [NHANES]    Frequency of Communication with Friends and Family: Patient declined    Frequency of Social Gatherings with Friends and Family: Patient declined    Attends Religious Services: More than 4 times per year    Active Member of [employer] or Organizations: Yes    Attends Banker Meetings: More than 4 times per year    Marital Status: Divorced  Intimate Partner Violence: Patient Declined (03/05/2023)   Humiliation, Afraid, Rape, and Kick questionnaire    Fear of Current or Ex-Partner: Patient declined    Emotionally Abused: Patient declined    Physically Abused: Patient declined    Sexually Abused: Patient declined    Review of Systems: ROS negative except for what is noted on the assessment and plan.  Vitals:   02/20/24 1402  BP: 124/78  Pulse: 89  Temp: 98.2 F (36.8 C)  TempSrc: Oral  SpO2: 98%  Weight: 149 lb (67.6 kg)  Height: 5\' 5"  (1.651 m)   Physical Exam: Constitutional: well-appearing *** sitting in ***, in no acute distress HENT: normocephalic atraumatic, mucous  membranes moist Eyes: conjunctiva non-erythematous Neck: supple Cardiovascular: regular rate and rhythm, no m/r/g Pulmonary/Chest: normal work of breathing on room air, lungs clear to auscultation bilaterally Abdominal: soft, non-tender, non-distended MSK: *** Neurological: alert & oriented x 3, 5/5 strength in bilateral upper and lower extremities, normal gait Skin: warm and dry Psych: ***  Assessment & Plan:   No problem-specific Assessment & Plan notes found for this encounter.  PMH: CAD, COPD, GERD, vitamin D , HLD, pancreatic mass, MDD,   Brain Fog  -follows with neurology and neuropsych -felt more scattered  -sleep has been poor, fragmented due to feels like brain is running  -focus been worse, worried about memory loss -after phone calls, feels stressed after  -more anxious and stress with fog  -tried Lexapro  and duloxetine    MoCA 20/30 08/2023  -negative MRI  -normal B12 and TSH 4.36 5 months ago, negative RPR -??recheck TSH? Stopped levothyroxine  in 07/2023   TSH 9.9  6 months ago, was started on synthroid  73 but started to not feel well and wacky, poor sleep and appetite, tremors, stopped synthroid  on 08/13/23  Overactive bladder -   Patient discussed with Dr. Juanna Norman, D.O. [facility], PGY-2 Phone: [phone] Date 02/20/2024 Time 11:40 AM

## 2024-02-20 NOTE — Telephone Encounter (Signed)
 Patient was called this am due to appointment being scheduled incorrectly.  She was scheduled to see our diabetes educator, Marlene Simas, and not a doctor.  Appointment for this afternoon has been canceled and switched to this morning at 10:45 am with Dr. Dorthy Gavia.

## 2024-02-20 NOTE — Telephone Encounter (Signed)
 Attempted to contact patient again about appointment time change, but no answer.  Was able to leave detailed message informing patient that her appointment has been changed from 2:15 pm to 1:45 pm this afternoon with Dr. Jari Merles.  Please disregard last message.

## 2024-02-21 ENCOUNTER — Encounter: Payer: Self-pay | Admitting: Student

## 2024-02-21 ENCOUNTER — Telehealth: Payer: Self-pay | Admitting: *Deleted

## 2024-02-21 DIAGNOSIS — F419 Anxiety disorder, unspecified: Secondary | ICD-10-CM | POA: Insufficient documentation

## 2024-02-21 DIAGNOSIS — R4189 Other symptoms and signs involving cognitive functions and awareness: Secondary | ICD-10-CM | POA: Insufficient documentation

## 2024-02-21 LAB — VITAMIN D 25 HYDROXY (VIT D DEFICIENCY, FRACTURES): Vit D, 25-Hydroxy: 23.2 ng/mL — ABNORMAL LOW (ref 30.0–100.0)

## 2024-02-21 NOTE — Assessment & Plan Note (Addendum)
 Reports "brain fog" for past few days.  Has history of this in the past and has seen neurology along with neuropsychology.  Concern is for mild cognitive impairment.  Previously attributed to poor sleep.  MoCA done in 2024 was 20. No acute findings on MRI by neurology.  Normal B12, negative RPR and normalized TSH 5 months ago. States she felt more scattered recently difficulty focusing.  She is more anxious at times after certain family phone calls.  Her sleep has been poor and fragmented due to feeling her mind racing.  New medications include a herbal supplement that she started over a week ago. She declined and prefers to limit medications given more reactions in the past, tried Lexapro  and duloxetine  in past but did not tolerate. PHQ-9 ~6 (did not complete all) and GAD-7 11. Willing to speak with therapist and continue f/u with neuropysch. No focal neuro deficits. No slurred speech.   Plan -Continue follow-up with neurology and neuropsychology (has appt later this year, patient to check about earlier appt) -Could be component of depression/anxiety, referral sent for VBCI  -Work on sleep hygiene, declined melatonin  -Repeat TSH today

## 2024-02-21 NOTE — Assessment & Plan Note (Addendum)
 Had elevated TSH in 07/2023 with normal T4 and T3 when seen in ED. Briefly started on levothyroxine  at prior OV but presented with worsening symptoms (tremors, poor sleep and appetite) so levothyroxine  was stopped 08/13/23.   Plan -Repeat TSH and free T4 today

## 2024-02-21 NOTE — Progress Notes (Signed)
 Complex Care Management Note Care Guide Note  02/21/2024 Name: Aimee Davis MRN: [id number] DOB: 05/30/1956   Complex Care Management Outreach Attempts: An unsuccessful telephone outreach was attempted today to offer the patient information about available complex care management services.  Follow Up Plan:  Additional outreach attempts will be made to offer the patient complex care management information and services.   Encounter Outcome:  No Answer  Kandis Ormond, CMA [place]  [facility], [facility] Guide Direct Dial: [phone]  Fax: [phone] Website: [email or web address]

## 2024-02-21 NOTE — Progress Notes (Signed)
 Internal Medicine Clinic Attending  Case discussed with the resident at the time of the visit.  We reviewed the resident's history and exam and pertinent patient test results.  I agree with the assessment, diagnosis, and plan of care documented in the resident's note.

## 2024-02-21 NOTE — Assessment & Plan Note (Signed)
 Vitamin D  27 in 2024. Prescribed D3 1000 units daily. Recheck vitamin D  level today.

## 2024-02-22 LAB — TSH: TSH: 3.75 u[IU]/mL (ref 0.450–4.500)

## 2024-02-22 LAB — T4, FREE: Free T4: 0.93 ng/dL (ref 0.82–1.77)

## 2024-02-24 ENCOUNTER — Ambulatory Visit: Payer: Self-pay | Admitting: Student

## 2024-02-24 MED ORDER — VITAMIN D 25 MCG (1000 UNIT) PO TABS: 1000.0000 [IU] | ORAL_TABLET | Freq: Every day | ORAL | 3 refills | Status: DC

## 2024-02-24 NOTE — Progress Notes (Unsigned)
 Assessment/Plan:    Mild cognitive impairment*** Memory impairment***  Aimee Davis is a very pleasant 68 y.o. RH female with a history ofextensive medical history including history of hypertension, hyperlipidemia, asthma, arthritis, cataracts, COPD-emphysema, GERD, insomnia, vit D3, anxiety, hypothyroidism presenting today in follow-up for evaluation of memory loss. During her first visit, there she reported that she was being evaluated for ADD and sleep apnea as well although she denies that she had any issues.  presenting today in follow-up for evaluation of memory loss. Patient is on ***.     Recommendations:   Follow up in   months. Repeat neurocognitive testing 12-18 months  Recommend good control of cardiovascular risk factors Continue to control mood as per PCP    Subjective:   This patient is accompanied in the office by ***  who supplements the history. Previous records as well as any outside records available were reviewed prior to todays visit.   Patient was last seen on ***.    Any changes in memory since last visit? "About the same, up an down sometimes". LTM is good. Mildew and Dust is affecting my memory, wrecking my brains. Planning to move next year.  repeats oneself?  Endorsed Disoriented when walking into a room?  Patient denies ***  Misplacing objects?  Patient denies   Wandering behavior?   Denies. Any personality changes since last visit? Denies.   Any worsening depression?: "I am feeling safer in my own mind" Hallucinations or paranoia?  Denies.   Seizures?   Denies.    Any sleep changes? Sleeps well***. Does not sleep very well***.   Denies vivid dreams, REM behavior or sleepwalking   Sleep apnea?   denies ***  Any hygiene concerns?   Denies.   Independent of bathing and dressing?  Endorsed  Does the patient needs help with medications? Patient is in charge *** Who is in charge of the finances?  Patient is in charge   *** Any changes in appetite?   denies ***   Patient have trouble swallowing?  Denies.   Does the patient cook?  Any kitchen accidents such as leaving the stove on?   Denies.   Any headaches?    Denies.   Vision changes? Denies. Chronic pain?  Denies.   Ambulates with difficulty?    Denies. ***  Recent falls or head injuries?    Denies.      Unilateral weakness, numbness or tingling?  Denies.   Any tremors?  Denies.   Any anosmia?    Denies.   Any incontinence of urine?  Denies.   Any bowel dysfunction?  Denies.      Patient lives alone .*** Does the patient drive?Denies any issues ***  Neuropsych evaluation 01/2024:  Briefly, results indicated variability across cognitive domains, with notable deficits in learning/memory, aspects of executive functioning, and confrontation naming. As functional independence is maintained, the most appropriate diagnosis at this time is mild cognitive impairment. Etiology of the deficits observed today is likely multifactorial, including mild cerebrovascular disease, persistent mood symptoms, sleep disturbance, and possible medication sensitivity/side effects. Patient recently discontinued some medications she believes were contributing to cognitive fogginess, though she was unable to specify which ones. She also noted an improvement in her sleep. Since these changes, she has experienced fewer episodes of mental fog, suggesting that better management of sleep and medications may support ongoing cognitive improvement. There is also a possibility of a longstanding learning disorder, as she reported lifelong difficulties with learning. Her history  and aspects of her cognitive profile are consistent with this possibility. Finally, she performed poorly on a confrontation naming task and did not benefit from phonemic cueing. This raises the question of whether her naming difficulty reflects limited familiarity with certain words--possibly related to educational background--or reduced access to semantic  knowledge, which could be an early marker of a neurodegenerative process such as Alzheimer's disease. In contrast, her performance on a semantic fluency task was within normal limits, which may argue against widespread semantic breakdown at this time. Given these naming difficulties in the context of learning/memory impairments, serial monitoring is warranted to rule out the possibility of an emerging neurodegenerative process.   Past Medical History:  Diagnosis Date   Abdominal pain 08/16/2008   Qualifier: Diagnosis of  By: Gaylyn Keas FNP, [name]     Abdominal pain, epigastric 09/08/2019   Allergy    seasonal, PCN, antidepressants, bentyl    Aortic atherosclerosis (HCC) 02/23/2021   Arthritis    Asthma    Asymptomatic bacteriuria 08/24/2021   UA showed leukocytes but patient does not have dysuria, fevers or hematuria. She also had bacteruria in her last UA. Her symptoms of fatigue, and loss of appetite are non-specific but if persist or she begins having dysuria we can work up further. A UTI would not explain the abdominal pain patient is having.    Carpal tunnel syndrome on both sides    right worse than left 2008   Cataract 06/11/2013   COPD (chronic obstructive pulmonary disease) (HCC)    COVID-19 virus infection 06/18/2016   Eczema 05/02/2018   Food poisoning 04/04/2021   GERD (gastroesophageal reflux disease)    Hair loss 07/07/2019   Hyperlipidemia    LDL 108 JULY 2013   IBS (irritable bowel syndrome)    Macular rash 05/20/2019   Multiple nevi 03/01/2015   Pneumonia    Positive TB test    From MArch 2009 note : Recent F/u at [facility]. CXRAy negative.  Positive TST in 2007 (14mm).  Unable to tolerate INH due to hepatotoxicity   Primary insomnia 06/05/2012   Skin macule 04/21/2019   Tinnitus 07/07/2019   URI (upper respiratory infection) 11/01/2010   Qualifier: Diagnosis of  By: Gaylyn Keas FNP, Davene Ernst       Past Surgical History:  Procedure Laterality Date   BREAST CYST EXCISION Right 2011    BREAST EXCISIONAL BIOPSY     benign   BREAST EXCISIONAL BIOPSY Right 1990   benign   INDUCED ABORTION  2005   VAGINAL HYSTERECTOMY  1983   partial; for abnormal bleeding     PREVIOUS MEDICATIONS:   CURRENT MEDICATIONS:  Outpatient Encounter Medications as of 02/25/2024  Medication Sig   albuterol  (VENTOLIN  HFA) 108 (90 Base) MCG/ACT inhaler Inhale 1 puff into the lungs every 6 (six) hours as needed for wheezing or shortness of breath. USE 2 INHALATIONS BY MOUTH EVERY 6 HOURS AS NEEDED FOR WHEEZING  OR SHORTNESS OF BREATH   Artificial Tear Ointment (DRY EYES OP) Apply 1 drop to eye daily as needed (dry eyes).   Ascorbic Acid (VITAMIN C) 100 MG tablet Take 50 mg by mouth daily.   aspirin  EC 81 MG tablet Take 1 tablet (81 mg total) by mouth daily. Swallow whole.   Budeson-Glycopyrrol-Formoterol  (BREZTRI  AEROSPHERE) 160-9-4.8 MCG/ACT AERO Inhale 2 puffs into the lungs in the morning and at bedtime.   carboxymethylcellul-glycerin (OPTIVE) 0.5-0.9 % ophthalmic solution 1 drop 4 (four) times daily as needed for dry eyes.   cholecalciferol  (VITAMIN D3)  25 MCG (1000 UNIT) tablet Take 1 tablet (1,000 Units total) by mouth daily.   cromolyn (OPTICROM) 4 % ophthalmic solution Place 1 drop into both eyes 2 (two) times daily.   diltiazem  (CARDIZEM ) 30 MG tablet Take 1 tablet (30 mg total) by mouth 2 (two) times daily as needed (For palpitations).   Evolocumab  (REPATHA  SURECLICK) 140 MG/ML SOAJ Inject 140 mg into the skin every 14 (fourteen) days.   Evolocumab  (REPATHA  SURECLICK) 140 MG/ML SOAJ Inject 140 mg into the skin every 14 (fourteen) days.   ezetimibe  (ZETIA ) 10 MG tablet TAKE 1 TABLET BY MOUTH DAILY   fexofenadine  (ALLEGRA ) 180 MG tablet Take 1 tablet (180 mg total) by mouth daily.   mometasone  (NASONEX ) 50 MCG/ACT nasal spray PLACE 2 SPRAYS IN EACH NOSTRIL ONCE DAILY AS NEEDED.   Multiple Vitamins-Minerals (MULTIVITAMIN WITH MINERALS) tablet Take 1 tablet by mouth daily.   pantoprazole   (PROTONIX ) 40 MG tablet Take 1 tablet (40 mg total) by mouth 2 (two) times daily before a meal for 15 days.   rosuvastatin  (CRESTOR ) 10 MG tablet Take 1 tablet (10 mg total) by mouth daily.   Facility-Administered Encounter Medications as of 02/25/2024  Medication   acetaminophen  (TYLENOL ) tablet 1,000 mg   diclofenac  Sodium (VOLTAREN ) 1 % topical gel 4 g     Objective:     PHYSICAL EXAMINATION:    VITALS:  There were no vitals filed for this visit.  GEN:  The patient appears stated age and is in NAD. HEENT:  Normocephalic, atraumatic.   Neurological examination:  General: NAD, well-groomed, appears stated age. Orientation: The patient is alert. Oriented to person, place and not to date.*** Cranial nerves: There is good facial symmetry.The speech is fluent and clear. No aphasia or dysarthria. Fund of knowledge is appropriate. Recent memory impaired and remote memory is normal.  Attention and concentration are normal.  Able to name objects and repeat phrases.  Hearing is intact to conversational tone ***.   Delayed recall *** Sensation: Sensation is intact to light touch throughout Motor: Strength is at least antigravity x4. DTR's 2/4 in UE/LE      08/29/2023    1:00 PM  Montreal Cognitive Assessment   Visuospatial/ Executive (0/5) 3  Naming (0/3) 2  Attention: Read list of digits (0/2) 2  Attention: Read list of letters (0/1) 1  Attention: Serial 7 subtraction starting at 100 (0/3) 1  Language: Repeat phrase (0/2) 1  Language : Fluency (0/1) 1  Abstraction (0/2) 0  Delayed Recall (0/5) 2  Orientation (0/6) 6  Total 19  Adjusted Score (based on education) 20        No data to display             Movement examination: Tone: There is normal tone in the UE/LE Abnormal movements:  no tremor.  No myoclonus.  No asterixis.   Coordination:  There is no decremation with RAM's. Normal finger to nose  Gait and Station: The patient has no difficulty arising out of a  deep-seated chair without the use of the hands. The patient's stride length is good.  Gait is cautious and narrow.   Thank you for allowing us  the opportunity to participate in the care of this nice patient. Please do not hesitate to contact us  for any questions or concerns.   Total time spent on today's visit was *** minutes dedicated to this patient today, preparing to see patient, examining the patient, ordering tests and/or medications and counseling the patient,  documenting clinical information in the EHR or other health record, independently interpreting results and communicating results to the patient/family, discussing treatment and goals, answering patient's questions and coordinating care.  Cc:  Jonelle Neri, DO  [facility] 02/24/2024 3:11 PM

## 2024-02-24 NOTE — Addendum Note (Signed)
 Addended by: Jearldine Mina on: 02/24/2024 05:10 PM   Modules accepted: Orders

## 2024-02-24 NOTE — Progress Notes (Signed)
 Complex Care Management Note  Care Guide Note 02/24/2024 Name: AERIELLE STOKLOSA MRN: [id number] DOB: 06-29-1956  Sonda Dural is a 69 y.o. year old female who sees Jonelle Neri, DO for primary care. I reached out to Sonda Dural by phone today to offer complex care management services.  Ms. Lichtenberg was given information about Complex Care Management services today including:   The Complex Care Management services include support from the care team which includes your Nurse Care Manager, Clinical Social Worker, or Pharmacist.  The Complex Care Management team is here to help remove barriers to the health concerns and goals most important to you. Complex Care Management services are voluntary, and the patient may decline or stop services at any time by request to their care team member.   Complex Care Management Consent Status: Patient agreed to services and verbal consent obtained.   Follow up plan:  Telephone appointment with complex care management team member scheduled for:  03/02/2024  Encounter Outcome:  Patient Scheduled  Kandis Ormond, CMA [place]  [facility], [facility] Guide Direct Dial: [phone]  Fax: [phone] Website: [email or web address]

## 2024-02-25 ENCOUNTER — Encounter: Payer: Self-pay | Admitting: Physician Assistant

## 2024-02-25 ENCOUNTER — Ambulatory Visit (INDEPENDENT_AMBULATORY_CARE_PROVIDER_SITE_OTHER): Admitting: Physician Assistant

## 2024-02-25 VITALS — Ht 65.0 in

## 2024-02-25 DIAGNOSIS — G3184 Mild cognitive impairment, so stated: Secondary | ICD-10-CM | POA: Diagnosis not present

## 2024-02-25 NOTE — Patient Instructions (Addendum)
 It was a pleasure to see you today at our office.   Recommendations:  Neurocognitive evaluation at our office   Follow up  6 months  For psychiatric meds, mood meds: Please have your primary care physician manage these medications.  If you have any severe symptoms of a stroke, or other severe issues such as confusion,severe chills or fever, etc call 911 or go to the ER as you may need to be evaluated further     For assessment of decision of mental capacity and competency:  Call Dr. Laverne Potter, geriatric psychiatrist at [phone]  Counseling regarding caregiver distress, including caregiver depression, anxiety and issues regarding community resources, adult day care programs, adult living facilities, or memory care questions:  please contact your  Primary Doctor's Social Worker   Whom to call: Memory  decline, memory medications: Call our office [phone]    [email or web address]   RECOMMENDATIONS FOR ALL PATIENTS WITH MEMORY PROBLEMS: 1. Continue to exercise (Recommend 30 minutes of walking everyday, or 3 hours every week) 2. Increase social interactions - continue going to [place] and enjoy social gatherings with friends and family 3. Eat healthy, avoid fried foods and eat more fruits and vegetables 4. Maintain adequate blood pressure, blood sugar, and blood cholesterol level. Reducing the risk of stroke and cardiovascular disease also helps promoting better memory. 5. Avoid stressful situations. Live a simple life and avoid aggravations. Organize your time and prepare for the next day in anticipation. 6. Sleep well, avoid any interruptions of sleep and avoid any distractions in the bedroom that may interfere with adequate sleep quality 7. Avoid sugar, avoid sweets as there is a strong link between excessive sugar intake, diabetes, and cognitive impairment We discussed the Mediterranean diet, which has been shown to help  patients reduce the risk of progressive memory disorders and reduces cardiovascular risk. This includes eating fish, eat fruits and green leafy vegetables, nuts like almonds and hazelnuts, walnuts, and also use olive oil. Avoid fast foods and fried foods as much as possible. Avoid sweets and sugar as sugar use has been linked to worsening of memory function.  There is always a concern of gradual progression of memory problems. If this is the case, then we may need to adjust level of care according to patient needs. Support, both to the patient and caregiver, should then be put into place.        DRIVING: Regarding driving, in patients with progressive memory problems, driving will be impaired. We advise to have someone else do the driving if trouble finding directions or if minor accidents are reported. Independent driving assessment is available to determine safety of driving.   If you are interested in the driving assessment, you can contact the following:  [employer] in [place] [phone]  [facility] [phone]  [facility] [phone]  [facility] [phone] or [phone]   FALL PRECAUTIONS: Be cautious when walking. Scan the area for obstacles that may increase the risk of trips and falls. When getting up in the mornings, sit up at the edge of the bed for a few minutes before getting out of bed. Consider elevating the bed at the head end to avoid drop of blood pressure when getting up. Walk always in a well-lit room (use night lights in the walls). Avoid area rugs or power cords from appliances in the middle of the walkways. Use a walker or a cane if necessary and consider physical therapy for balance exercise. Get your eyesight  checked regularly.  FINANCIAL OVERSIGHT: Supervision, especially oversight when making financial decisions or transactions is also recommended.  HOME SAFETY: Consider the safety of the kitchen when  operating appliances like stoves, microwave oven, and blender. Consider having supervision and share cooking responsibilities until no longer able to participate in those. Accidents with firearms and other hazards in the house should be identified and addressed as well.   ABILITY TO BE LEFT ALONE: If patient is unable to contact 911 operator, consider using LifeLine, or when the need is there, arrange for someone to stay with patients. Smoking is a fire hazard, consider supervision or cessation. Risk of wandering should be assessed by caregiver and if detected at any point, supervision and safe proof recommendations should be instituted.  MEDICATION SUPERVISION: Inability to self-administer medication needs to be constantly addressed. Implement a mechanism to ensure safe administration of the medications.      Mediterranean Diet A Mediterranean diet refers to food and lifestyle choices that are based on the traditions of countries located on the [employer]. This way of eating has been shown to help prevent certain conditions and improve outcomes for people who have chronic diseases, like kidney disease and heart disease. What are tips for following this plan? Lifestyle  Cook and eat meals together with your family, when possible. Drink enough fluid to keep your urine clear or pale yellow. Be physically active every day. This includes: Aerobic exercise like running or swimming. Leisure activities like gardening, walking, or housework. Get 7-8 hours of sleep each night. If recommended by your health care provider, drink red wine in moderation. This means 1 glass a day for nonpregnant women and 2 glasses a day for men. A glass of wine equals 5 oz (150 mL). Reading food labels  Check the serving size of packaged foods. For foods such as rice and pasta, the serving size refers to the amount of cooked product, not dry. Check the total fat in packaged foods. Avoid foods that have saturated  fat or trans fats. Check the ingredients list for added sugars, such as corn syrup. Shopping  At the grocery store, buy most of your food from the areas near the walls of the store. This includes: Fresh fruits and vegetables (produce). Grains, beans, nuts, and seeds. Some of these may be available in unpackaged forms or large amounts (in bulk). Fresh seafood. Poultry and eggs. Low-fat dairy products. Buy whole ingredients instead of prepackaged foods. Buy fresh fruits and vegetables in-season from local farmers markets. Buy frozen fruits and vegetables in resealable bags. If you do not have access to quality fresh seafood, buy precooked frozen shrimp or canned fish, such as tuna, salmon, or sardines. Buy small amounts of raw or cooked vegetables, salads, or olives from the deli or salad bar at your store. Stock your pantry so you always have certain foods on hand, such as olive oil, canned tuna, canned tomatoes, rice, pasta, and beans. Cooking  Cook foods with extra-virgin olive oil instead of using butter or other vegetable oils. Have meat as a side dish, and have vegetables or grains as your main dish. This means having meat in small portions or adding small amounts of meat to foods like pasta or stew. Use beans or vegetables instead of meat in common dishes like chili or lasagna. Experiment with different cooking methods. Try roasting or broiling vegetables instead of steaming or sauteing them. Add frozen vegetables to soups, stews, pasta, or rice. Add nuts or seeds for added healthy  fat at each meal. You can add these to yogurt, salads, or vegetable dishes. Marinate fish or vegetables using olive oil, lemon juice, garlic, and fresh herbs. Meal planning  Plan to eat 1 vegetarian meal one day each week. Try to work up to 2 vegetarian meals, if possible. Eat seafood 2 or more times a week. Have healthy snacks readily available, such as: Vegetable sticks with hummus. Greek yogurt. Fruit  and nut trail mix. Eat balanced meals throughout the week. This includes: Fruit: 2-3 servings a day Vegetables: 4-5 servings a day Low-fat dairy: 2 servings a day Fish, poultry, or lean meat: 1 serving a day Beans and legumes: 2 or more servings a week Nuts and seeds: 1-2 servings a day Whole grains: 6-8 servings a day Extra-virgin olive oil: 3-4 servings a day Limit red meat and sweets to only a few servings a month What are my food choices? Mediterranean diet Recommended Grains: Whole-grain pasta. Brown rice. Bulgar wheat. Polenta. Couscous. Whole-wheat bread. Dwyane Glad. Vegetables: Artichokes. Beets. Broccoli. Cabbage. Carrots. Eggplant. Green beans. Chard. Kale. Spinach. Onions. Leeks. Peas. Squash. Tomatoes. Peppers. Radishes. Fruits: Apples. Apricots. Avocado. Berries. Bananas. Cherries. Dates. Figs. Grapes. Lemons. Melon. Oranges. Peaches. Plums. Pomegranate. Meats and other protein foods: Beans. Almonds. Sunflower seeds. Pine nuts. Peanuts. Cod. Salmon. Scallops. Shrimp. Tuna. Tilapia. Clams. Oysters. Eggs. Dairy: Low-fat milk. Cheese. Greek yogurt. Beverages: Water. Red wine. Herbal tea. Fats and oils: Extra virgin olive oil. Avocado oil. Grape seed oil. Sweets and desserts: [place] yogurt with honey. Baked apples. Poached pears. Trail mix. Seasoning and other foods: Basil. Cilantro. Coriander. Cumin. Mint. Parsley. Sage. Rosemary. Tarragon. Garlic. Oregano. Thyme. Pepper. Balsalmic vinegar. Tahini. Hummus. Tomato sauce. Olives. Mushrooms. Limit these Grains: Prepackaged pasta or rice dishes. Prepackaged cereal with added sugar. Vegetables: Deep fried potatoes (french fries). Fruits: Fruit canned in syrup. Meats and other protein foods: Beef. Pork. Lamb. Poultry with skin. Hot dogs. Helene Loader. Dairy: Ice cream. Sour cream. Whole milk. Beverages: Juice. Sugar-sweetened soft drinks. Beer. Liquor and spirits. Fats and oils: Butter. Canola oil. Vegetable oil. Beef fat (tallow).  Lard. Sweets and desserts: Cookies. Cakes. Pies. Candy. Seasoning and other foods: Mayonnaise. Premade sauces and marinades. The items listed may not be a complete list. Talk with your dietitian about what dietary choices are right for you. Summary The Mediterranean diet includes both food and lifestyle choices. Eat a variety of fresh fruits and vegetables, beans, nuts, seeds, and whole grains. Limit the amount of red meat and sweets that you eat. Talk with your health care provider about whether it is safe for you to drink red wine in moderation. This means 1 glass a day for nonpregnant women and 2 glasses a day for men. A glass of wine equals 5 oz (150 mL). This information is not intended to replace advice given to you by your health care provider. Make sure you discuss any questions you have with your health care provider. Document Released: 04/26/2016 Document Revised: 05/29/2016 Document Reviewed: 04/26/2016 Elsevier Interactive Patient Education  2017 [employer].

## 2024-03-02 ENCOUNTER — Other Ambulatory Visit: Payer: Self-pay | Admitting: Licensed Clinical Social Worker

## 2024-03-02 NOTE — Telephone Encounter (Signed)
 The pt cancelled her appointment   Visit Type: VBCI TELEPHONE CALL 60 [2503] Copay: $0.00  Provider: Afton Horse Department: [facility]  Referring Provider: Jonelle Neri CSN: [id number]      Auto Confirm Status: TEXT YES  Notes: LCSW initial outreach referral [phone]  Made On: Confirmed: Canceled: 02/24/2024 9:20 AM 02/28/2024 7:30 PM 03/02/2024 2:21 PM By: By: By: Demetrio Finder, BACKGROUND SNEAD, STACEY J  Cancel Rsn: Patient   Copied from CRM [id number]. Topic: General - Other >> Mar 02, 2024 12:01 PM Tisa Forester wrote: Reason for CRM: patient have question and need information regarding the appointment today 03/02/24 at 11:00am with  Forrest, Larene Pleasant, LCSW suppose to be a telephone appointment , patient have not received a call or anything need more information about this appointment and how to get hold of the provider  Suann Elms, LCSW [phone]

## 2024-03-03 ENCOUNTER — Other Ambulatory Visit: Payer: Self-pay | Admitting: Licensed Clinical Social Worker

## 2024-03-03 ENCOUNTER — Other Ambulatory Visit: Payer: Self-pay

## 2024-03-03 NOTE — Patient Outreach (Signed)
 Complex Care Management   Visit Note  03/03/2024  Name:  Aimee Davis MRN: [id number] DOB: 1956-02-05  Situation: Referral received for Complex Care Management related to management of memory challenges, managing anxiety issues I obtained verbal consent from Patient.  Visit completed with patient  on the phone  Background:   Past Medical History:  Diagnosis Date   Abdominal pain 08/16/2008   Qualifier: Diagnosis of  By: Gaylyn Keas FNP, [name]     Abdominal pain, epigastric 09/08/2019   Allergy    seasonal, PCN, antidepressants, bentyl    Aortic atherosclerosis (HCC) 02/23/2021   Arthritis    Asthma    Asymptomatic bacteriuria 08/24/2021   UA showed leukocytes but patient does not have dysuria, fevers or hematuria. She also had bacteruria in her last UA. Her symptoms of fatigue, and loss of appetite are non-specific but if persist or she begins having dysuria we can work up further. A UTI would not explain the abdominal pain patient is having.    Carpal tunnel syndrome on both sides    right worse than left 2008   Cataract 06/11/2013   COPD (chronic obstructive pulmonary disease) (HCC)    COVID-19 virus infection 06/18/2016   Eczema 05/02/2018   Food poisoning 04/04/2021   GERD (gastroesophageal reflux disease)    Hair loss 07/07/2019   Hyperlipidemia    LDL 108 JULY 2013   IBS (irritable bowel syndrome)    Macular rash 05/20/2019   Multiple nevi 03/01/2015   Pneumonia    Positive TB test    From [facility] 2009 note : [facility] F/u at [facility]. CXRAy negative.  Positive TST in 2007 (14mm).  Unable to tolerate INH due to hepatotoxicity   Primary insomnia 06/05/2012   Skin macule 04/21/2019   Tinnitus 07/07/2019   URI (upper respiratory infection) 11/01/2010   Qualifier: Diagnosis of  By: Gaylyn Keas FNP, Davene Ernst      Assessment: Patient Reported Symptoms:  Cognitive    Memory issues    Neurological  Anxiety issues.     HEENT    Wear glasses. Seasonal Allergies    Cardiovascular    HTN; CAD   Respiratory  Seasonal allergies; asthma history (uses inhaler as needed)    Endocrine Patient reports the following symptoms related to hypoglycemia or hyperglycemia : Weakness or fatigue, Headaches Is patient diabetic?: No Endocrine Conditions: Vitamin D  deficiency Endocrine Management Strategies: Adequate rest, Activity, Coping strategies  Gastrointestinal Gastrointestinal Symptoms Reported: No symptoms reported Additional Gastrointestinal Details: IBS Gastrointestinal Conditions: Abdominal pain Gastrointestinal Management Strategies: Adequate rest, Activity    Genitourinary Genitourinary Symptoms Reported: No symptoms reported Genitourinary Management Strategies: Adequate rest  Integumentary Integumentary Symptoms Reported: No symptoms reported Skin Management Strategies: Adequate rest, Activity  Musculoskeletal Musculoskelatal Symptoms Reviewed: Weakness Musculoskeletal Management Strategies: Adequate rest, Activity, Coping strategies Falls in the past year?: No  Likes to walk for exercise if she is able to do so  Psychosocial Psychosocial Symptoms Reported: Anxiety - if selected complete GAD, Depression - if selected complete PHQ 2-9, Sadness - if selected complete PHQ 2-9 Additional Psychological Details: memory challenges Behavioral Health Conditions: Anxiety, Depression Behavioral Management Strategies: Activity, Adequate rest, Coping strategies Major Change/Loss/Stressor/Fears (CP): Medical condition, self Techniques to Cope with Loss/Stress/Change: Counseling, Medication Quality of Family Relationships: helpful Do you feel physically threatened by others?: No      03/03/2024   11:27 AM  Depression screen PHQ 2/9  Decreased Interest 1  Down, Depressed, Hopeless 1  PHQ - 2 Score 2  Altered sleeping 1  Tired, decreased energy 1  Change in appetite 1  Feeling bad or failure about yourself  1  Trouble concentrating 1  Moving slowly or fidgety/restless 1  Suicidal  thoughts 0  PHQ-9 Score 8  Difficult doing work/chores Somewhat difficult    Vitals:  Within normal range, per client  Medications Reviewed Today     Reviewed by Afton Horse (Social Worker) on 03/03/24 at 1117  Med List Status: <None>   Medication Order Taking? Sig Documenting Provider Last Dose Status Informant  acetaminophen  (TYLENOL ) tablet 1,000 mg [id number] yes  Dorthy Gavia, MD  Active   albuterol  (VENTOLIN  HFA) 108 (90 Base) MCG/ACT inhaler [id number] Yes Inhale 1 puff into the lungs every 6 (six) hours as needed for wheezing or shortness of breath. USE 2 INHALATIONS BY MOUTH EVERY 6 HOURS AS NEEDED FOR WHEEZING  OR SHORTNESS OF BREATH Jonelle Neri, DO  Active   Artificial Tear Ointment (DRY EYES OP) [id number] Yes Apply 1 drop to eye daily as needed (dry eyes). [provider]  Active Self, Pharmacy Records  Ascorbic Acid (VITAMIN C) 100 MG tablet [id number] Yes Take 50 mg by mouth daily. [provider]  Active Self, Pharmacy Records  aspirin  EC 81 MG tablet [id number] Yes Take 1 tablet (81 mg total) by mouth daily. Swallow whole. Thukkani, Arun K, MD  Active Self, Pharmacy Records  Budeson-Glycopyrrol-Formoterol  (BREZTRI  AEROSPHERE) 160-9-4.8 MCG/ACT Sudie Ely [id number] Yes Inhale 2 puffs into the lungs in the morning and at bedtime. Hunsucker, Archer Kobs, MD  Active   carboxymethylcellul-glycerin (OPTIVE) 0.5-0.9 % ophthalmic solution [id number] Yes 1 drop 4 (four) times daily as needed for dry eyes. [provider]  Active   cholecalciferol  (VITAMIN D3) 25 MCG (1000 UNIT) tablet [id number] Yes Take 1 tablet (1,000 Units total) by mouth daily. Zheng, Kely Dohn, DO  Active   cromolyn (OPTICROM) 4 % ophthalmic solution [id number] Yes Place 1 drop into both eyes 2 (two) times daily. [provider]  Active   diclofenac  Sodium (VOLTAREN ) 1 % topical gel 4 g [id number] Not taking  Jayson Yakir Wenke, MD  Active   diltiazem  (CARDIZEM ) 30 MG tablet [id number]  Yes Take 1 tablet (30 mg total) by mouth 2 (two) times daily as needed (For palpitations). Sonny Dust, MD  Active   Evolocumab  (REPATHA  SURECLICK) 140 MG/ML Stevens Eland [id number] Yes Inject 140 mg into the skin every 14 (fourteen) days. Jann Melody, MD  Active   Evolocumab  (REPATHA  SURECLICK) 140 MG/ML SOAJ [id number] Yes Inject 140 mg into the skin every 14 (fourteen) days. Jann Melody, MD  Active   ezetimibe  (ZETIA ) 10 MG tablet [id number] Yes TAKE 1 TABLET BY MOUTH DAILY Chandrasekhar, Mahesh A, MD  Active   fexofenadine  (ALLEGRA ) 180 MG tablet [id number] Yes Take 1 tablet (180 mg total) by mouth daily. Vita Grip, MD  Active Self, Pharmacy Records  mometasone  (NASONEX ) 50 MCG/ACT nasal spray [id number] Yes PLACE 2 SPRAYS IN EACH NOSTRIL ONCE DAILY AS NEEDED. Jonelle Neri, DO  Active   Multiple Vitamins-Minerals (MULTIVITAMIN WITH MINERALS) tablet [id number] Yes Take 1 tablet by mouth daily. [provider]  Active Self, Pharmacy Records  pantoprazole  (PROTONIX ) 40 MG tablet [id number] unknown Take 1 tablet (40 mg total) by mouth 2 (two) times daily before a meal for 15 days. Jonelle Neri, DO  Expired 11/28/23 2359   rosuvastatin  (CRESTOR ) 10 MG tablet [id number] Takes injection as prescribed Take 1 tablet (10 mg total) by mouth daily. Cathalene Clipper, [facility]  Active  Recommendation:   Take medications as prescribed Attend scheduled medical appointments Use coping skills to manage feelings of sadness or depression Call LCSW as needed for SW support Consider using program services for support Consider going to [facility] at least one time weekly for social activity Allow time to complete ADLs Use Calendar to help with remembering upcoming appointments or tasks to complete  Follow Up Plan:   LCSW to call client on March 17, 2024 at 3:00 PM    Alexandria Angel  MSW, LCSW [place] Licensed  Clinical Social Worker Direct Dial:  [phone] Fax:  [phone] Website:  [email or web address]

## 2024-03-03 NOTE — Patient Instructions (Signed)
 Visit Information  Thank you for taking time to visit with me today. Please don't hesitate to contact me if I can be of assistance to you before our next scheduled appointment.  Our next appointment is by telephone on March 17, 2024 at 3:00 PM   Please call the care guide team at [phone] if you need to cancel or reschedule your appointment.   Following is a copy of your care plan:   Goals Addressed             This Visit's Progress    VBCI Social Work Care Plan       Problems:   Memory issues             Some concentration issues/challenges              Reduced family support             Risk of social isolation  CSW Clinical Goal(s):   Over the next 30 days the Patient will attend all scheduled medical appointments as evidenced by patient report and care team review of appointment completion in electronic MEDICAL RECORD NUMBER .             Over the next 30 days, Aimee Davis will use coping skills to help her manage memory challenges AEB patient report of no missed appointments in 30 day period (use of calendar, reminders by family members, working with pharmacy to get needed meds)  Interventions:  Discussed memory aids for client; used of monthly calendar, family reminder calls, visual cues (big calendar); work with pharmacy rep)             Discussed transport of client. Aimee Davis said she drives her car as needed              Discussed medication procurement             Discussed client insurance. :  Medicare and Medicaid.  She said she has U card ot use as needed.               Discussed pain issues of client. Discussed food procurement.             Discussed PCP support             Discussed vision. She wears glasses.             Discussed mood. She expressed feelings of loneliness.  She has niece she calls for conversation.  She has gone to [employer] occasionally. 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 Discussed education activities at local senior center.  Encouraged  client to engage in social activities of choice               Provided counseling support for client.              Discussed care needs of client's mother              Discussed program support with RN, LCSW, Pharmacist               Patient Goals/Self-Care Activities:  Attend medical appointments as scheduled             Take medications as prescribed             Eat meals on schedule. Allow time for rest, relaxation             Call LCSW as needed for SW support  Consider accessing program support services as needed                         Allow time for ADLs completion              Plan:   Telephone follow up appointment with care management team member scheduled for:  03/17/24 at 3:00 PM          Please go to [facility] [address] [place] [phone]) if you are experiencing a Mental Health or Behavioral Health Crisis or need someone to talk to.  The patient verbalized understanding of instructions, educational materials, and care plan provided today and DECLINED offer to receive copy of patient instructions, educational materials, and care plan.    Aimee Davis  MSW, LCSW [place] Licensed Clinical Social Worker Direct Dial:  [phone] Fax:  [phone] Website:  [email or web address]

## 2024-03-05 ENCOUNTER — Encounter: Payer: Self-pay | Admitting: Student

## 2024-03-05 ENCOUNTER — Ambulatory Visit: Admitting: Student

## 2024-03-05 VITALS — BP 115/68 | HR 80 | Temp 98.2°F | Ht 65.0 in | Wt 149.0 lb

## 2024-03-05 DIAGNOSIS — R5383 Other fatigue: Secondary | ICD-10-CM

## 2024-03-05 MED ORDER — PANTOPRAZOLE SODIUM 40 MG PO TBEC: 40.0000 mg | DELAYED_RELEASE_TABLET | Freq: Every day | ORAL | Status: AC

## 2024-03-05 NOTE — Assessment & Plan Note (Signed)
 Patient presents to the clinic with concerns of fatigue. She reports that she has been having concerns with fatigue with a long time. She has been worked up in the past for concerns of fatigue and has had numerous workup with labs. She has had normal folate, normal B12, normal kidney function and electrolytes. She notes that she has been having trouble with daytime fatigue. She states that she sleeps 12-13 hours a day. She notes that she has lot of stressors which she is dealing with in her sister who is getting chemo and her mom who is sick. She was tearful in the room as she states that it is really affecting her quality of life. I discussed about concerns with depression but she reports that she is not depressed. At previous visit she did not finish her PHQ-9 and she declines to do one today. She agreed to get a therapist from [facility]. One other thought that I had was if this could be sleep apnea. She likely does not have obstructive sleep apnea but she could have central sleep apnea. She states that she would like to check her labs before a sleep study.   Plan: -Check iron, ferritin, cmp, CBC, b12, and mag -Will also check for AM cortisol -Previously had a celiac work up which was negative  -if labs are unremarkable will get sleep study

## 2024-03-05 NOTE — Patient Instructions (Addendum)
 Teneisha C Hollingworth,Thank you for allowing me to take part in your care today.  Here are your instructions.  [facility]  1. [address], [place], [place] [address]  [phone]  Please call them to make an appointment   2. I am going to call you with the results of your lab. If all normal we can refer you for a sleep study.    3. Come back in 3 months   PLEASE BRING YOUR MEDICATIONS TO EVERY APPOINTMENT  Thank you, Dr. Lydia Sams  If you have any other questions please contact the internal medicine clinic at [phone] If it is after hours, please call the [facility] at [phone] and then ask the person who picks up for the resident on call.

## 2024-03-05 NOTE — Progress Notes (Signed)
 CC: Fatigue follow up   HPI:  Aimee Davis is a 68 y.o. female with a past medical history of CAD, COPD, GERD, overactive bladder who presents for follow-up appointment.  Please see assessment and plan for full HPI.  Medications: COPD: Albuterol , Breztri  Vitamin D  deficiency: Vitamin D  supplementation  Hyperlipidemia: Repatha  140 mg every 14 days Allergic rhinitis: Allegra  180 mg daily GERD: Protonix  40 mg twice daily   Past Medical History:  Diagnosis Date   Abdominal pain 08/16/2008   Qualifier: Diagnosis of  By: Gaylyn Keas FNP, [name]     Abdominal pain, epigastric 09/08/2019   Allergy    seasonal, PCN, antidepressants, bentyl    Aortic atherosclerosis (HCC) 02/23/2021   Arthritis    Asthma    Asymptomatic bacteriuria 08/24/2021   UA showed leukocytes but patient does not have dysuria, fevers or hematuria. She also had bacteruria in her last UA. Her symptoms of fatigue, and loss of appetite are non-specific but if persist or she begins having dysuria we can work up further. A UTI would not explain the abdominal pain patient is having.    Carpal tunnel syndrome on both sides    right worse than left 2008   Cataract 06/11/2013   COPD (chronic obstructive pulmonary disease) (HCC)    COVID-19 virus infection 06/18/2016   Eczema 05/02/2018   Food poisoning 04/04/2021   GERD (gastroesophageal reflux disease)    Hair loss 07/07/2019   Hyperlipidemia    LDL 108 JULY 2013   IBS (irritable bowel syndrome)    Macular rash 05/20/2019   Multiple nevi 03/01/2015   Pneumonia    Positive TB test    From [facility] 2009 note : Recent F/u at [facility]. CXRAy negative.  Positive TST in 2007 (14mm).  Unable to tolerate INH due to hepatotoxicity   Primary insomnia 06/05/2012   Skin macule 04/21/2019   Tinnitus 07/07/2019   URI (upper respiratory infection) 11/01/2010   Qualifier: Diagnosis of  By: Gaylyn Keas FNP, Davene Ernst       Current Outpatient Medications:    albuterol  (VENTOLIN  HFA) 108 (90 Base)  MCG/ACT inhaler, Inhale 1 puff into the lungs every 6 (six) hours as needed for wheezing or shortness of breath. USE 2 INHALATIONS BY MOUTH EVERY 6 HOURS AS NEEDED FOR WHEEZING  OR SHORTNESS OF BREATH, Disp: 26.8 g, Rfl: 2   Budeson-Glycopyrrol-Formoterol  (BREZTRI  AEROSPHERE) 160-9-4.8 MCG/ACT AERO, Inhale 2 puffs into the lungs in the morning and at bedtime., Disp: 1 each, Rfl: 11   carboxymethylcellul-glycerin (OPTIVE) 0.5-0.9 % ophthalmic solution, 1 drop 4 (four) times daily as needed for dry eyes., Disp: , Rfl:    cholecalciferol  (VITAMIN D3) 25 MCG (1000 UNIT) tablet, Take 1 tablet (1,000 Units total) by mouth daily., Disp: 90 tablet, Rfl: 3   cromolyn (OPTICROM) 4 % ophthalmic solution, Place 1 drop into both eyes 2 (two) times daily., Disp: , Rfl:    Evolocumab  (REPATHA  SURECLICK) 140 MG/ML SOAJ, Inject 140 mg into the skin every 14 (fourteen) days., Disp: 6 mL, Rfl: 3   Evolocumab  (REPATHA  SURECLICK) 140 MG/ML SOAJ, Inject 140 mg into the skin every 14 (fourteen) days., Disp: 1 mL, Rfl: 0   fexofenadine  (ALLEGRA ) 180 MG tablet, Take 1 tablet (180 mg total) by mouth daily., Disp: 90 tablet, Rfl: 1   mometasone  (NASONEX ) 50 MCG/ACT nasal spray, PLACE 2 SPRAYS IN EACH NOSTRIL ONCE DAILY AS NEEDED., Disp: 17 g, Rfl: 6   Multiple Vitamins-Minerals (MULTIVITAMIN WITH MINERALS) tablet, Take 1 tablet by mouth daily.,  Disp: , Rfl:    pantoprazole  (PROTONIX ) 40 MG tablet, Take 1 tablet (40 mg total) by mouth daily for 15 days., Disp: , Rfl:   Review of Systems:   Constitutional: Patient endorse fatigue   Physical Exam:  Vitals:   03/05/24 1335  BP: 115/68  Pulse: 80  Temp: 98.2 F (36.8 C)  TempSrc: Oral  SpO2: 99%  Weight: 149 lb (67.6 kg)  Height: 5' 5 (1.651 m)   General: Patient is sitting comfortably in the room  Head: Normocephalic, atraumatic  Cardio: Regular rate and rhythm, no murmurs, rubs or gallops Pulmonary: Clear to ausculation bilaterally with no rales, rhonchi, and  crackles    Assessment & Plan:   Fatigue Patient presents to the clinic with concerns of fatigue. She reports that she has been having concerns with fatigue with a long time. She has been worked up in the past for concerns of fatigue and has had numerous workup with labs. She has had normal folate, normal B12, normal kidney function and electrolytes. She notes that she has been having trouble with daytime fatigue. She states that she sleeps 12-13 hours a day. She notes that she has lot of stressors which she is dealing with in her sister who is getting chemo and her mom who is sick. She was tearful in the room as she states that it is really affecting her quality of life. I discussed about concerns with depression but she reports that she is not depressed. At previous visit she did not finish her PHQ-9 and she declines to do one today. She agreed to get a [facility]. One other thought that I had was if this could be sleep apnea. She likely does not have obstructive sleep apnea but she could have central sleep apnea. She states that she would like to check her labs before a sleep study.   Plan: -Check iron, ferritin, cmp, CBC, b12, and mag -Will also check for AM cortisol -Previously had a celiac work up which was negative  -if labs are unremarkable will get sleep study   Patient discussed with Dr. Amiel Balder, DO PGY-2 Internal Medicine Resident

## 2024-03-06 LAB — IRON,TIBC AND FERRITIN PANEL
Ferritin: 51 ng/mL (ref 15–150)
Iron Saturation: 33 % (ref 15–55)
Iron: 127 ug/dL (ref 27–139)
Total Iron Binding Capacity: 383 ug/dL (ref 250–450)
UIBC: 256 ug/dL (ref 118–369)

## 2024-03-06 LAB — CMP14 + ANION GAP
ALT: 17 IU/L (ref 0–32)
AST: 26 IU/L (ref 0–40)
Albumin: 4.4 g/dL (ref 3.9–4.9)
Alkaline Phosphatase: 88 IU/L (ref 44–121)
Anion Gap: 16 mmol/L (ref 10.0–18.0)
BUN/Creatinine Ratio: 9 — ABNORMAL LOW (ref 12–28)
BUN: 9 mg/dL (ref 8–27)
Bilirubin Total: 0.4 mg/dL (ref 0.0–1.2)
CO2: 21 mmol/L (ref 20–29)
Calcium: 10 mg/dL (ref 8.7–10.3)
Chloride: 104 mmol/L (ref 96–106)
Creatinine, Ser: 0.95 mg/dL (ref 0.57–1.00)
Globulin, Total: 1.7 g/dL (ref 1.5–4.5)
Glucose: 65 mg/dL — ABNORMAL LOW (ref 70–99)
Potassium: 4.3 mmol/L (ref 3.5–5.2)
Sodium: 141 mmol/L (ref 134–144)
Total Protein: 6.1 g/dL (ref 6.0–8.5)
eGFR: 65 mL/min/{1.73_m2} (ref >59)

## 2024-03-06 LAB — CBC WITH DIFFERENTIAL/PLATELET
Basophils Absolute: 0 10*3/uL (ref 0.0–0.2)
Basos: 0 %
EOS (ABSOLUTE): 0 10*3/uL (ref 0.0–0.4)
Eos: 1 %
Hematocrit: 41.4 % (ref 34.0–46.6)
Hemoglobin: 13.6 g/dL (ref 11.1–15.9)
Immature Grans (Abs): 0 10*3/uL (ref 0.0–0.1)
Immature Granulocytes: 0 %
Lymphocytes Absolute: 0.7 10*3/uL (ref 0.7–3.1)
Lymphs: 27 %
MCH: 28.2 pg (ref 26.6–33.0)
MCHC: 32.9 g/dL (ref 31.5–35.7)
MCV: 86 fL (ref 79–97)
Monocytes Absolute: 0.2 10*3/uL (ref 0.1–0.9)
Monocytes: 10 %
Neutrophils Absolute: 1.6 10*3/uL (ref 1.4–7.0)
Neutrophils: 62 %
Platelets: 255 10*3/uL (ref 150–450)
RBC: 4.83 x10E6/uL (ref 3.77–5.28)
RDW: 12.8 % (ref 11.7–15.4)
WBC: 2.5 10*3/uL — CL (ref 3.4–10.8)

## 2024-03-06 LAB — VITAMIN B12: Vitamin B-12: 1039 pg/mL (ref 232–1245)

## 2024-03-06 LAB — MAGNESIUM: Magnesium: 2 mg/dL (ref 1.6–2.3)

## 2024-03-09 ENCOUNTER — Telehealth: Payer: Self-pay | Admitting: *Deleted

## 2024-03-09 ENCOUNTER — Other Ambulatory Visit

## 2024-03-09 NOTE — Telephone Encounter (Signed)
 Patient arrived to late to have it done.  Needs to be done early as you know.  Patient got up at 6 AM arrived here by 10 AM.  Was too late to get a true reading.

## 2024-03-09 NOTE — Telephone Encounter (Signed)
 Patient here for additional labs today .  Dr. Jeanelle reviewed.  Patient was informed that it was about the same as previous.  Patient has questions about other labs that she saw.  Will have Physician call patient to discuss lab work.  Has an appointment on tomorrow to do labs as well.

## 2024-03-10 ENCOUNTER — Other Ambulatory Visit

## 2024-03-10 ENCOUNTER — Ambulatory Visit: Payer: Self-pay | Admitting: Student

## 2024-03-10 ENCOUNTER — Other Ambulatory Visit: Payer: Self-pay | Admitting: *Deleted

## 2024-03-10 DIAGNOSIS — R5383 Other fatigue: Secondary | ICD-10-CM

## 2024-03-10 DIAGNOSIS — R5382 Chronic fatigue, unspecified: Secondary | ICD-10-CM

## 2024-03-10 NOTE — Telephone Encounter (Signed)
 Patient came today to get AM cortisol drawn. I called her on the phone, please see telephone note and result note.

## 2024-03-10 NOTE — Telephone Encounter (Signed)
 I have been on night float, so it has been hard for me to call the patient regarding her lab results. I finally was able to get in touch with the patient. I let her know her results she already came into the lab today to get her AM cortisol. I will also refer her to a sleep study as that is likely one of the last things we can do before thinking this likely chronic fatigue syndrome. She has agreed to doing a sleep study to evaluate for OSA. Will order.

## 2024-03-11 LAB — CORTISOL: Cortisol: 19 ug/dL (ref 6.2–19.4)

## 2024-03-11 NOTE — Progress Notes (Signed)
 Internal Medicine Clinic Attending  Case discussed with the resident at the time of the visit.  We reviewed the resident's history and exam and pertinent patient test results.  I agree with the assessment, diagnosis, and plan of care documented in the resident's note.

## 2024-03-12 ENCOUNTER — Telehealth: Payer: Self-pay | Admitting: Student

## 2024-03-12 NOTE — Telephone Encounter (Signed)
 Returned phone call to patient. Cortisol was 19, ruling out adrenal insufficiency. Discussed low white count with patient which was worked up in the past. She has seen Dr. Onesimo in 2019 and plan was if patient is to get recurrent infections she is to start medications for increasing her white count. She understood this plan but she has not been feeling sick and has not had much infections. Discussed options, and she was okay to monitor her white count for now and checking white count at next visit. Will send patient information for sleep study via mychart as it has been authorized.

## 2024-03-13 ENCOUNTER — Other Ambulatory Visit: Payer: Self-pay | Admitting: Student

## 2024-03-13 DIAGNOSIS — R5382 Chronic fatigue, unspecified: Secondary | ICD-10-CM

## 2024-03-17 ENCOUNTER — Other Ambulatory Visit: Payer: Self-pay | Admitting: Licensed Clinical Social Worker

## 2024-03-17 ENCOUNTER — Other Ambulatory Visit: Payer: Self-pay

## 2024-03-17 ENCOUNTER — Telehealth: Payer: Self-pay | Admitting: Student

## 2024-03-17 NOTE — Telephone Encounter (Signed)
 Please refer to message below.    Copied from CRM [id number]. Topic: Referral - Question >> Mar 11, 2024  3:24 PM Merlynn A wrote: Reason for CRM: Patient called in requesting to speak with nurse. Patient did not want to provide any referral information. Please contact patient at [phone].

## 2024-03-17 NOTE — Patient Instructions (Signed)
 Visit Information  Thank you for taking time to visit with me today. Please don't hesitate to contact me if I can be of assistance to you before our next scheduled appointment.  Our next appointment is by telephone on 04/21/24 at 10:00 AM   Please call the care guide team at [phone] if you need to cancel or reschedule your appointment.   Following is a copy of your care plan:   Goals Addressed             This Visit's Progress    VBCI Social Work Care Plan       Problems:   Memory issues             Some concentration issues/challenges              Reduced family support. Has some support from her son             Risk of social isolation She has starting attending local senior center for activities   CSW Clinical Goal(s):   Over the next 30 days the Patient will attend all scheduled medical appointments as evidenced by patient report and care team review of appointment completion in electronic MEDICAL RECORD NUMBER .             Over the next 30 days, Aimee Davis will use coping skills to help her manage memory challenges AEB patient report of no missed appointments in 30 day period (use of calendar, reminders by family members, working with pharmacy to get needed meds)  Interventions:               Discussed transport of client. Aimee Davis said she drives her car as needed              Discussed medication procurement             Discussed client insurance. :  Medicare and Medicaid.  She said she has U card to use as needed.               Discussed pain issues of client. Discussed food procurement.             Discussed PCP support             Discussed vision. She wears glasses.             Discussed mood. She expressed feelings of loneliness.  She has niece she calls for conversation.  She has gone to [employer] occasionally. 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 Discussed education activities at local senior center.  Encouraged client to engage in social activities of  choice . She has enjoyed going to local senior center for activities              Provided counseling support for client.              Discussed care needs of client's mother              Discussed program support with RN, LCSW, Pharmacist              Encouraged client to call LCSW as needed for SW support                             Patient Goals/Self-Care Activities:  Attend medical appointments as scheduled             Take medications as prescribed  Eat meals on schedule. Allow time for rest, relaxation             Call LCSW as needed for SW support             Consider accessing program support services as needed                         Allow time for ADLs completion              Plan:   Telephone follow up appointment with care management team member scheduled for:  04/21/24 at 10:00 AM          Please go to [facility] [address] Tab [phone]) if you are experiencing a Mental Health or Behavioral Health Crisis or need someone to talk to.  The patient verbalized understanding of instructions, educational materials, and care plan provided today and DECLINED offer to receive copy of patient instructions, educational materials, and care plan.    Aimee Davis  MSW, LCSW [place] Licensed Clinical Social Worker Direct Dial:  [phone] Fax:  [phone] Website:  [email or web address]

## 2024-03-17 NOTE — Patient Outreach (Signed)
 Complex Care Management   Visit Note  03/17/2024  Name:  Aimee Davis MRN: [id number] DOB: [birth year]  Situation: Referral received for Complex Care Management related to concentration challenges, social isolation issues I obtained verbal consent from Patient.  Visit completed with patient  on the phone  Background:   Past Medical History:  Diagnosis Date   Abdominal pain 08/16/2008   Qualifier: Diagnosis of  By: Gladis FNP, [name]     Abdominal pain, epigastric 09/08/2019   Allergy    seasonal, PCN, antidepressants, bentyl    Aortic atherosclerosis (HCC) 02/23/2021   Arthritis    Asthma    Asymptomatic bacteriuria 08/24/2021   UA showed leukocytes but patient does not have dysuria, fevers or hematuria. She also had bacteruria in her last UA. Her symptoms of fatigue, and loss of appetite are non-specific but if persist or she begins having dysuria we can work up further. A UTI would not explain the abdominal pain patient is having.    Carpal tunnel syndrome on both sides    right worse than left 2008   Cataract 06/11/2013   COPD (chronic obstructive pulmonary disease) (HCC)    COVID-19 virus infection 06/18/2016   Eczema 05/02/2018   Food poisoning 04/04/2021   GERD (gastroesophageal reflux disease)    Hair loss 07/07/2019   Hyperlipidemia    LDL 108 JULY 2013   IBS (irritable bowel syndrome)    Macular rash 05/20/2019   Multiple nevi 03/01/2015   Pneumonia    Positive TB test    From MArch 2009 note : Recent F/u at [facility]. CXRAy negative.  Positive TST in 2007 (14mm).  Unable to tolerate INH due to hepatotoxicity   Primary insomnia 06/05/2012   Skin macule 04/21/2019   Tinnitus 07/07/2019   URI (upper respiratory infection) 11/01/2010   Qualifier: Diagnosis of  By: Gladis FNP, [name]      Assessment: Patient Reported Symptoms:  Cognitive Cognitive Status: Alert and oriented to person, place, and time   Health Maintenance Behaviors: Healthy diet, Sleep adequate  Neurological  Neurological Review of Symptoms: Weakness, Dizziness Neurological Management Strategies: Coping strategies, Adequate rest  HEENT HEENT Symptoms Reported: No symptoms reported HEENT Management Strategies: Adequate rest, Coping strategies    Cardiovascular Cardiovascular Symptoms Reported: Fatigue Cardiovascular Management Strategies: Adequate rest, Coping strategies  Respiratory Respiratory Symptoms Reported: No symptoms reported Respiratory Management Strategies: Activity, Adequate rest, Coping strategies, Exercise  Endocrine Endocrine Symptoms Reported: Weakness or fatigue, Headaches Is patient diabetic?: No    Gastrointestinal Gastrointestinal Symptoms Reported: No symptoms reported Additional Gastrointestinal Details: IBS Gastrointestinal Management Strategies: Adequate rest, Activity, Coping strategies    Genitourinary Genitourinary Symptoms Reported: No symptoms reported Genitourinary Management Strategies: Adequate rest  Integumentary Integumentary Symptoms Reported: Skin changes Additional Integumentary Details: client concerned about her skin issues. She saw dermatologist in the past Skin Management Strategies: Adequate rest, Coping strategies, Activity  Musculoskeletal Musculoskelatal Symptoms Reviewed: Weakness Additional Musculoskeletal Details: likes to exercise when she is able Musculoskeletal Management Strategies: Adequate rest, Activity, Coping strategies      Psychosocial Psychosocial Symptoms Reported: Anxiety - if selected complete GAD, Depression - if selected complete PHQ 2-9 Additional Psychological Details: memory challenges Behavioral Management Strategies: Activity, Adequate rest, Coping strategies Major Change/Loss/Stressor/Fears (CP): Medical condition, self Techniques to Cope with Loss/Stress/Change: Diversional activities, Counseling Quality of Family Relationships: supportive Do you feel physically threatened by others?: No      03/17/2024    3:07 PM   Depression screen PHQ 2/9  Decreased Interest 1  Down, Depressed, Hopeless 1  PHQ - 2 Score 2  Altered sleeping 1  Tired, decreased energy 1  Change in appetite 1  Feeling bad or failure about yourself  1  Trouble concentrating 1  Moving slowly or fidgety/restless 1  Suicidal thoughts 0  PHQ-9 Score 8  Difficult doing work/chores Somewhat difficult    Vitals:  BP in normal range, per client  Medications Reviewed Today     Reviewed by Frances Ozell GORMAN KEN (Social Worker) on 03/17/24 at 1452  Med List Status: <None>   Medication Order Taking? Sig Documenting Provider Last Dose Status Informant  albuterol  (VENTOLIN  HFA) 108 (90 Base) MCG/ACT inhaler [id number] Yes Inhale 1 puff into the lungs every 6 (six) hours as needed for wheezing or shortness of breath. USE 2 INHALATIONS BY MOUTH EVERY 6 HOURS AS NEEDED FOR WHEEZING  OR SHORTNESS OF BREATH Tobie Gaines, DO  Active   Budeson-Glycopyrrol-Formoterol  (BREZTRI  AEROSPHERE) 160-9-4.8 MCG/ACT AERO [id number] Yes Inhale 2 puffs into the lungs in the morning and at bedtime. Hunsucker, Donnice SAUNDERS, MD  Active   carboxymethylcellul-glycerin (OPTIVE) 0.5-0.9 % ophthalmic solution [id number] Yes 1 drop 4 (four) times daily as needed for dry eyes. [provider]  Active   cholecalciferol  (VITAMIN D3) 25 MCG (1000 UNIT) tablet [id number] Yes Take 1 tablet (1,000 Units total) by mouth daily. Zheng, Janya Eveland, DO  Active   cromolyn (OPTICROM) 4 % ophthalmic solution [id number] Yes Place 1 drop into both eyes 2 (two) times daily. [provider]  Active   Evolocumab  (REPATHA  SURECLICK) 140 MG/ML SOAJ [id number] Not taking Inject 140 mg into the skin every 14 (fourteen) days.  Patient not taking: Reported on 03/17/2024   Santo Stanly LABOR, MD  Active   Evolocumab  (REPATHA  SURECLICK) 140 MG/ML EMMANUEL [id number] Not taking Inject 140 mg into the skin every 14 (fourteen) days.  Patient not taking: Reported on 03/17/2024   Santo Stanly LABOR, MD  Active   fexofenadine  (ALLEGRA ) 180 MG tablet [id number] Yes Take 1 tablet (180 mg total) by mouth daily. Lou Claretta HERO, MD  Active Self, Pharmacy Records  mometasone  (NASONEX ) 50 MCG/ACT nasal spray [id number] Yes PLACE 2 SPRAYS IN EACH NOSTRIL ONCE DAILY AS NEEDED. Tobie Gaines, DO  Active   Multiple Vitamins-Minerals (MULTIVITAMIN WITH MINERALS) tablet [id number] Yes Take 1 tablet by mouth daily. [provider]  Active Self, Pharmacy Records  pantoprazole  (PROTONIX ) 40 MG tablet [id number] Yes Take 1 tablet (40 mg total) by mouth daily for 15 days. Tobie Gaines, DO  Active             Recommendation:   PCP Follow-up Continue Current Plan of Care Take medications as prescribed Attend activities of choice at local [facility] Call LCSW as needed for SW support  Follow Up Plan:   Telephone follow up appointment date/time:  04/21/24 at 10:00 AM   Glendia Frances  MSW, LCSW [place] Licensed Clinical Social Worker Direct Dial:  [phone] Fax:  [phone] Website:  [email or web address]

## 2024-03-17 NOTE — Telephone Encounter (Signed)
 Pt's message received today. Pt was called; no answer, left message to call the office if she still needs to talk to the nurse.

## 2024-04-07 ENCOUNTER — Other Ambulatory Visit: Payer: Self-pay | Admitting: Student

## 2024-04-07 DIAGNOSIS — R5382 Chronic fatigue, unspecified: Secondary | ICD-10-CM

## 2024-04-21 ENCOUNTER — Other Ambulatory Visit: Admitting: Licensed Clinical Social Worker

## 2024-04-23 ENCOUNTER — Other Ambulatory Visit: Payer: Self-pay | Admitting: Internal Medicine

## 2024-04-29 ENCOUNTER — Ambulatory Visit

## 2024-04-29 VITALS — Ht 65.0 in | Wt 155.0 lb

## 2024-04-29 DIAGNOSIS — Z0000 Encounter for general adult medical examination without abnormal findings: Secondary | ICD-10-CM

## 2024-04-29 NOTE — Progress Notes (Signed)
 Because this visit was a virtual/telehealth visit,  certain criteria was not obtained, such a blood pressure, CBG if applicable, and timed get up and go. Any medications not marked as taking were not mentioned during the medication reconciliation part of the visit. Any vitals not documented were not able to be obtained due to this being a telehealth visit or patient was unable to self-report a recent blood pressure reading due to a lack of equipment at home via telehealth. Vitals that have been documented are verbally provided by the patient.   Subjective:   Aimee Davis is a 68 y.o. who presents for a Medicare Wellness preventive visit.  As a reminder, Annual Wellness Visits don't include a physical exam, and some assessments may be limited, especially if this visit is performed virtually. We may recommend an in-person follow-up visit with your provider if needed.  Visit Complete: Virtual I connected with  Aimee Davis on 04/29/24 by a audio enabled telemedicine application and verified that I am speaking with the correct person using two identifiers.  Patient Location: Home  Provider Location: Home Office  I discussed the limitations of evaluation and management by telemedicine. The patient expressed understanding and agreed to proceed.  Vital Signs: Because this visit was a virtual/telehealth visit, some criteria may be missing or patient reported. Any vitals not documented were not able to be obtained and vitals that have been documented are patient reported.  VideoDeclined- This patient declined Librarian, academic. Therefore the visit was completed with audio only.  Persons Participating in Visit: Patient.  AWV Questionnaire: No: Patient Medicare AWV questionnaire was not completed prior to this visit.  Cardiac Risk Factors include: advanced age (>13men, >12 women);dyslipidemia;family history of premature cardiovascular disease;sedentary  lifestyle     Objective:    Today's Vitals   04/29/24 1113  Weight: 155 lb (70.3 kg)  Height: 5' 5 (1.651 m)  PainSc: 0-No pain   Body mass index is 25.79 kg/m.     04/29/2024   11:19 AM 02/25/2024    3:47 PM 02/20/2024    2:00 PM 11/28/2023    3:01 PM 08/29/2023    1:23 PM 07/25/2023    4:14 PM 07/22/2023    4:13 PM  Advanced Directives  Does Patient Have a Medical Advance Directive? No No No No No No No  Would patient like information on creating a medical advance directive? No - Patient declined No - Patient declined No - Patient declined No - Patient declined No - Patient declined No - Patient declined No - Patient declined    Current Medications (verified) Outpatient Encounter Medications as of 04/29/2024  Medication Sig   albuterol  (VENTOLIN  HFA) 108 (90 Base) MCG/ACT inhaler Inhale 1 puff into the lungs every 6 (six) hours as needed for wheezing or shortness of breath. USE 2 INHALATIONS BY MOUTH EVERY 6 HOURS AS NEEDED FOR WHEEZING  OR SHORTNESS OF BREATH   Budeson-Glycopyrrol-Formoterol  (BREZTRI  AEROSPHERE) 160-9-4.8 MCG/ACT AERO Inhale 2 puffs into the lungs in the morning and at bedtime.   carboxymethylcellul-glycerin (OPTIVE) 0.5-0.9 % ophthalmic solution 1 drop 4 (four) times daily as needed for dry eyes.   cholecalciferol  (VITAMIN D3) 25 MCG (1000 UNIT) tablet Take 1 tablet (1,000 Units total) by mouth daily.   cromolyn (OPTICROM) 4 % ophthalmic solution Place 1 drop into both eyes 2 (two) times daily.   Evolocumab  (REPATHA  SURECLICK) 140 MG/ML SOAJ Inject 140 mg into the skin every 14 (fourteen) days. (Patient not taking:  Reported on 03/17/2024)   Evolocumab  (REPATHA  SURECLICK) 140 MG/ML SOAJ Inject 140 mg into the skin every 14 (fourteen) days. (Patient not taking: Reported on 03/17/2024)   fexofenadine  (ALLEGRA ) 180 MG tablet Take 1 tablet (180 mg total) by mouth daily.   mometasone  (NASONEX ) 50 MCG/ACT nasal spray PLACE 2 SPRAYS IN EACH NOSTRIL ONCE DAILY AS NEEDED.    Multiple Vitamins-Minerals (MULTIVITAMIN WITH MINERALS) tablet Take 1 tablet by mouth daily.   pantoprazole  (PROTONIX ) 40 MG tablet Take 1 tablet (40 mg total) by mouth daily for 15 days.   No facility-administered encounter medications on file as of 04/29/2024.    Allergies (verified) Dicyclomine , Hydrocodone , Lipitor [atorvastatin ], Penicillins, and Levsin  [hyoscyamine ]   History: Past Medical History:  Diagnosis Date   Abdominal pain 08/16/2008   Qualifier: Diagnosis of  By: Gladis FNP, [name]     Abdominal pain, epigastric 09/08/2019   Allergy    seasonal, PCN, antidepressants, bentyl    Aortic atherosclerosis (HCC) 02/23/2021   Arthritis    Asthma    Asymptomatic bacteriuria 08/24/2021   UA showed leukocytes but patient does not have dysuria, fevers or hematuria. She also had bacteruria in her last UA. Her symptoms of fatigue, and loss of appetite are non-specific but if persist or she begins having dysuria we can work up further. A UTI would not explain the abdominal pain patient is having.    Carpal tunnel syndrome on both sides    right worse than left 2008   Cataract 06/11/2013   COPD (chronic obstructive pulmonary disease) (HCC)    COVID-19 virus infection 06/18/2016   Eczema 05/02/2018   Food poisoning 04/04/2021   GERD (gastroesophageal reflux disease)    Hair loss 07/07/2019   Hyperlipidemia    LDL 108 JULY 2013   IBS (irritable bowel syndrome)    Macular rash 05/20/2019   Multiple nevi 03/01/2015   Pneumonia    Positive TB test    From MArch 2009 note : Recent F/u at [facility]. CXRAy negative.  Positive TST in 2007 (14mm).  Unable to tolerate INH due to hepatotoxicity   Primary insomnia 06/05/2012   Skin macule 04/21/2019   Tinnitus 07/07/2019   URI (upper respiratory infection) 11/01/2010   Qualifier: Diagnosis of  By: Gladis FNP, Delorise     Past Surgical History:  Procedure Laterality Date   BREAST CYST EXCISION Right 2011   BREAST EXCISIONAL BIOPSY     benign    BREAST EXCISIONAL BIOPSY Right 1990   benign   INDUCED ABORTION  2005   VAGINAL HYSTERECTOMY  1983   partial; for abnormal bleeding   Family History  Problem Relation Age of Onset   Hypertension Mother    Heart disease Mother    Diabetes Mother    Arthritis Mother    Stroke Father    Diabetes Father    Heart disease Father    Hyperlipidemia Father    Hypertension Father    Kidney disease Father    Hypertension Sister    Breast cancer Sister    Breast cancer Sister    Hypertension Sister    Arthritis Sister    Hypertension Sister    Diabetes Sister    Mental illness Brother    Aneurysm Brother        brain   Arthritis Brother    Hypertension Maternal Aunt    Hypertension Maternal Uncle    Stomach cancer Maternal Uncle    Hypertension Paternal Aunt    Kidney disease Paternal Aunt  x 2   Hypertension Paternal Uncle    Kidney disease Paternal Uncle        x 2   Colon cancer Neg Hx    Esophageal cancer Neg Hx    Pancreatic cancer Neg Hx    Social History   Socioeconomic History   Marital status: Divorced    Spouse name: Not on file   Number of children: 2   Years of education: Not on file   Highest education level: Some college, no degree  Occupational History   Occupation: retired  Tobacco Use   Smoking status: Former    Current packs/day: 0.00    Average packs/day: 0.3 packs/day for 43.0 years (14.2 ttl pk-yrs)    Types: Cigarettes    Start date: 01/06/1970    Quit date: 01/06/2013    Years since quitting: 11.3   Smokeless tobacco: Never  Vaping Use   Vaping status: Never Used  Substance and Sexual Activity   Alcohol use: No    Alcohol/week: 0.0 standard drinks of alcohol   Drug use: Not Currently    Comment: x 26 yrs.   Sexual activity: Not on file    Comment: S/P PARTIAL HYSTERECTOMY  Other Topics Concern   Not on file  Social History Narrative   Right handed   Drinks caffeine prn   Lives alone   Retired   One floor home   Social  Drivers of Health   Financial Resource Strain: Low Risk  (04/29/2024)   Overall Financial Resource Strain (CARDIA)    Difficulty of Paying Living Expenses: Not hard at all  Food Insecurity: No Food Insecurity (04/29/2024)   Hunger Vital Sign    Worried About Running Out of Food in the Last Year: Never true    Ran Out of Food in the Last Year: Never true  Transportation Needs: No Transportation Needs (04/29/2024)   PRAPARE - Administrator, Civil Service (Medical): No    Lack of Transportation (Non-Medical): No  Physical Activity: Insufficiently Active (04/29/2024)   Exercise Vital Sign    Days of Exercise per Week: 1 day    Minutes of Exercise per Session: 10 min  Stress: No Stress Concern Present (04/29/2024)   [employer] of Occupational Health - Occupational Stress Questionnaire    Feeling of Stress: Not at all  Social Connections: Unknown (04/29/2024)   Social Connection and Isolation Panel    Frequency of Communication with Friends and Family: Patient declined    Frequency of Social Gatherings with Friends and Family: Patient declined    Attends Religious Services: More than 4 times per year    Active Member of [employer] or Organizations: Yes    Attends Engineer, structural: More than 4 times per year    Marital Status: Divorced    Tobacco Counseling Counseling given: Not Answered    Clinical Intake:  Pre-visit preparation completed: Yes  Pain : No/denies pain Pain Score: 0-No pain     BMI - recorded: 25.79 Nutritional Status: BMI 25 -29 Overweight Nutritional Risks: None Diabetes: No  Lab Results  Component Value Date   HGBA1C 5.3 04/01/2019   HGBA1C 5.3 12/28/2015   HGBA1C 5.4 01/25/2015     How often do you need to have someone help you when you read instructions, pamphlets, or other written materials from your doctor or pharmacy?: 1 - Never What is the last grade level you completed in school?: SOME COLLEGE  Interpreter Needed?:  No  Information entered  by :: Aimee SAILOR. Darilyn Storbeck, LPN.   Activities of Daily Living     04/29/2024   11:19 AM  In your present state of health, do you have any difficulty performing the following activities:  Hearing? 0  Vision? 0  Difficulty concentrating or making decisions? 0  Walking or climbing stairs? 0  Dressing or bathing? 0  Doing errands, shopping? 0  Preparing Food and eating ? N  Using the Toilet? N  In the past six months, have you accidently leaked urine? N  Do you have problems with loss of bowel control? N  Managing your Medications? N  Managing your Finances? N  Housekeeping or managing your Housekeeping? N    Patient Care Team: Tobie Gaines, DO as PCP - General [place]art Powell BRAVO, MD (Inactive) as PCP - Cardiology (Cardiology) Frances Ozell RAMAN, LCSW as [facility] Care Management (Licensed Clinical Social Worker) Liza, Jenkins, MD as Referring Physician (Gastroenterology)  I have updated your Care Teams any recent Medical Services you may have received from other providers in the past year.     Assessment:   This is a routine wellness examination for Aimee Davis.  Hearing/Vision screen Hearing Screening - Comments:: Denies hearing difficulties.  Vision Screening - Comments:: Wears rx glasses - up to date with routine eye exams with [facility]     Goals Addressed             This Visit's Progress    04/29/24: To get back into a exercise regimen every week.         Depression Screen     04/29/2024   11:22 AM 03/17/2024    3:07 PM 03/17/2024    2:54 PM 03/03/2024   11:27 AM 03/03/2024   11:18 AM 02/20/2024    3:36 PM 02/20/2024    2:00 PM  PHQ 2/9 Scores  PHQ - 2 Score 0 2 2 2 4 2  0  PHQ- 9 Score 0 8 8 8 10 6      Fall Risk     04/29/2024   11:18 AM 03/03/2024   11:25 AM 02/25/2024    3:47 PM 02/20/2024    1:59 PM 11/28/2023    3:01 PM  Fall Risk   Falls in the past year? 0 0 0 0 0  Number falls in past yr: 0  0 0 0  Injury with Fall? 0  0 0 0   Risk for fall due to : No Fall Risks   No Fall Risks   Follow up Falls evaluation completed  Falls evaluation completed Falls evaluation completed Falls evaluation completed    MEDICARE RISK AT HOME:  Medicare Risk at Home Any stairs in or around the home?: No If so, are there any without handrails?: No Home free of loose throw rugs in walkways, pet beds, electrical cords, etc?: Yes Adequate lighting in your home to reduce risk of falls?: Yes Life alert?: No Use of a cane, walker or w/c?: No Grab bars in the bathroom?: No Shower chair or bench in shower?: No Elevated toilet seat or a handicapped toilet?: No  TIMED UP AND GO:  Was the test performed?  No  Cognitive Function: Declined/Normal: No cognitive concerns noted by patient or family. Patient alert, oriented, able to answer questions appropriately and recall recent events. No signs of memory loss or confusion.    04/29/2024   11:22 AM  MMSE - Mini Mental State Exam  Not completed: Unable to complete      08/29/2023  1:00 PM  Montreal Cognitive Assessment   Visuospatial/ Executive (0/5) 3  Naming (0/3) 2  Attention: Read list of digits (0/2) 2  Attention: Read list of letters (0/1) 1  Attention: Serial 7 subtraction starting at 100 (0/3) 1  Language: Repeat phrase (0/2) 1  Language : Fluency (0/1) 1  Abstraction (0/2) 0  Delayed Recall (0/5) 2  Orientation (0/6) 6  Total 19  Adjusted Score (based on education) 20      04/29/2024   11:17 AM 03/05/2023    1:24 PM  6CIT Screen  What Year? 0 points 0 points  What month? 0 points 0 points  What time? 0 points 0 points  Count back from 20 0 points 0 points  Months in reverse 0 points 0 points  Repeat phrase 0 points 0 points  Total Score 0 points 0 points    Immunizations Immunization History  Administered Date(s) Administered   Influenza Split 06/05/2012   Influenza Whole 08/04/2009, 06/16/2010   Influenza,inj,Quad PF,6+ Mos 07/14/2013, 08/10/2014,  06/24/2018, 06/16/2019, 06/20/2020   PFIZER(Purple Top)SARS-COV-2 Vaccination 11/27/2019, 12/21/2019, 09/23/2020   Pneumococcal Polysaccharide-23 11/16/2002   Td 12/16/2004   Tdap 04/14/2015    Screening Tests Health Maintenance  Topic Date Due   Zoster Vaccines- Shingrix (1 of 2) Never done   Pneumococcal Vaccine: 50+ Years (2 of 2 - PCV) 11/16/2003   COVID-19 Vaccine (4 - 2024-25 season) 05/19/2023   INFLUENZA VACCINE  04/17/2024   MAMMOGRAM  12/23/2024   DTaP/Tdap/Td (3 - Td or Tdap) 04/13/2025   Medicare Annual Wellness (AWV)  04/29/2025   Colonoscopy  11/25/2025   DEXA SCAN  Completed   Hepatitis C Screening  Completed   Hepatitis B Vaccines  Aged Out   HPV VACCINES  Aged Out   Meningococcal B Vaccine  Aged Out    Health Maintenance  Health Maintenance Due  Topic Date Due   Zoster Vaccines- Shingrix (1 of 2) Never done   Pneumococcal Vaccine: 50+ Years (2 of 2 - PCV) 11/16/2003   COVID-19 Vaccine (4 - 2024-25 season) 05/19/2023   INFLUENZA VACCINE  04/17/2024   Health Maintenance Items Addressed: Yes Patient is due for the following care gaps: Covid-19, Pneumonia, Shingrix and Flu.  Additional Screening:  Vision Screening: Recommended annual ophthalmology exams for early detection of glaucoma and other disorders of the eye. Would you like a referral to an eye doctor? No    Dental Screening: Recommended annual dental exams for proper oral hygiene  Community Resource Referral / Chronic Care Management: CRR required this visit?  No   CCM required this visit?  No   Plan:    I have personally reviewed and noted the following in the patient's chart:   Medical and social history Use of alcohol, tobacco or illicit drugs  Current medications and supplements including opioid prescriptions. Patient is not currently taking opioid prescriptions. Functional ability and status Nutritional status Physical activity Advanced directives List of other  physicians Hospitalizations, surgeries, and ER visits in previous 12 months Vitals Screenings to include cognitive, depression, and falls Referrals and appointments  In addition, I have reviewed and discussed with patient certain preventive protocols, quality metrics, and best practice recommendations. A written personalized care plan for preventive services as well as general preventive health recommendations were provided to patient.   Aimee LOISE Fuller, LPN   [id number]/[id number]   After Visit Summary: (MyChart) Due to this being a telephonic visit, the after visit summary with patients personalized plan was offered to  patient via MyChart   Notes: Patient is due for the following care gaps: Covid-19, Pneumonia, Shingrix and Flu.

## 2024-04-29 NOTE — Patient Instructions (Signed)
 Ms. Whitacre , Thank you for taking time out of your busy schedule to complete your Annual Wellness Visit with me. I enjoyed our conversation and look forward to speaking with you again next year. I, as well as your care team,  appreciate your ongoing commitment to your health goals. Please review the following plan we discussed and let me know if I can assist you in the future. Your Game plan/ To Do List    Referrals: If you haven't heard from the office you've been referred to, please reach out to them at the phone provided.   Follow up Visits: We will see or speak with you next year for your Next Medicare AWV with our clinical staff Have you seen your provider in the last 6 months (3 months if uncontrolled diabetes)? Yes  Clinician Recommendations:  Aim for 30 minutes of exercise or brisk walking, 6-8 glasses of water, and 5 servings of fruits and vegetables each day.       This is a list of the screenings recommended for you:  Health Maintenance  Topic Date Due   Zoster (Shingles) Vaccine (1 of 2) Never done   Pneumococcal Vaccine for age over 53 (2 of 2 - PCV) 11/16/2003   COVID-19 Vaccine (4 - 2024-25 season) 05/19/2023   Flu Shot  04/17/2024   Mammogram  12/23/2024   DTaP/Tdap/Td vaccine (3 - Td or Tdap) 04/13/2025   Medicare Annual Wellness Visit  04/29/2025   Colon Cancer Screening  11/25/2025   DEXA scan (bone density measurement)  Completed   Hepatitis C Screening  Completed   Hepatitis B Vaccine  Aged Out   HPV Vaccine  Aged Out   Meningitis B Vaccine  Aged Out    Advanced directives: (Declined) Advance directive discussed with you today. Even though you declined this today, please call our office should you change your mind, and we can give you the proper paperwork for you to fill out. Advance Care Planning is important because it:  [x]  Makes sure you receive the medical care that is consistent with your values, goals, and preferences  [x]  It provides guidance to your  family and loved ones and reduces their decisional burden about whether or not they are making the right decisions based on your wishes.  Follow the link provided in your after visit summary or read over the paperwork we have mailed to you to help you started getting your Advance Directives in place. If you need assistance in completing these, please reach out to us  so that we can help you!  See attachments for Preventive Care and Fall Prevention Tips.

## 2024-05-11 ENCOUNTER — Ambulatory Visit (INDEPENDENT_AMBULATORY_CARE_PROVIDER_SITE_OTHER)

## 2024-05-11 VITALS — BP 111/75 | HR 72 | Temp 98.5°F | Ht 63.0 in | Wt 152.8 lb

## 2024-05-11 DIAGNOSIS — G4719 Other hypersomnia: Secondary | ICD-10-CM | POA: Diagnosis not present

## 2024-05-11 DIAGNOSIS — J449 Chronic obstructive pulmonary disease, unspecified: Secondary | ICD-10-CM

## 2024-05-11 MED ORDER — BREZTRI AEROSPHERE 160-9-4.8 MCG/ACT IN AERO: 2.0000 | INHALATION_SPRAY | Freq: Two times a day (BID) | RESPIRATORY_TRACT | 11 refills | Status: AC

## 2024-05-11 MED ORDER — BREZTRI AEROSPHERE 160-9-4.8 MCG/ACT IN AERO: 2.0000 | INHALATION_SPRAY | Freq: Two times a day (BID) | RESPIRATORY_TRACT | Status: DC

## 2024-05-11 NOTE — Patient Instructions (Addendum)
 Continue Breztri  2 puffs inhaled twice a day; samples provided in office today to cover while awaiting mail order Rx.  Continue Albuterol  as needed for dyspnea.  Complete home sleep test as ordered.  Return to clinic for review of HST results in 4-6 weeks.

## 2024-05-11 NOTE — Progress Notes (Signed)
 @Patient  ID: Aimee Davis, female    DOB: June 18, 1956, 68 y.o.   MRN: [id number]  Chief Complaint  Patient presents with   COPD   Asthma    Referring provider: Tobie Gaines, DO  HPI: Aimee Davis is a 68 y/o female patient with a PMH of COPD and asthma who presents today for follow up.  She reports that she is here for evaluation of issues with sleep.  She was referred by her internal medicine provider for evaluation of ongoing chronic fatigue.  Patient reports that she experiences excessive daytime sleepiness on a day-to-day basis.  She also reports that she takes naps during the day and has difficulty sleeping at night.  She is unsure if she snores or not as she lives alone.  She denies daily headaches, fever, chills, cough, chest pain, palpitations, night sweats, weight loss.  She does report that she has been using her Breztri  twice daily.  She feels great benefit from this medication.  She has run out of of the medication as of today.  She is interested in refilling this.  She reports that she uses albuterol  only when it is more humid outside.  TEST/EVENTS :   Allergies  Allergen Reactions   Dicyclomine      confusion   Hydrocodone  Other (See Comments)    Recovering addict X 29 years; no narcotics per patient   Lipitor [Atorvastatin ] Other (See Comments)    Pt reports causes upset stomach, cramps in legs, poor appetite, and sleep disturbance   Penicillins Other (See Comments)    REACTION: rash   Levsin  [Hyoscyamine ] Anxiety    Immunization History  Administered Date(s) Administered   Influenza Split 06/05/2012   Influenza Whole 08/04/2009, 06/16/2010   Influenza,inj,Quad PF,6+ Mos 07/14/2013, 08/10/2014, 06/24/2018, 06/16/2019, 06/20/2020   PFIZER(Purple Top)SARS-COV-2 Vaccination 11/27/2019, 12/21/2019, 09/23/2020   Pneumococcal Polysaccharide-23 11/16/2002   Td 12/16/2004   Tdap 04/14/2015    Past Medical History:  Diagnosis Date   Abdominal pain 08/16/2008    Qualifier: Diagnosis of  By: Gladis FNP, [name]     Abdominal pain, epigastric 09/08/2019   Allergy    seasonal, PCN, antidepressants, bentyl    Aortic atherosclerosis (HCC) 02/23/2021   Arthritis    Asthma    Asymptomatic bacteriuria 08/24/2021   UA showed leukocytes but patient does not have dysuria, fevers or hematuria. She also had bacteruria in her last UA. Her symptoms of fatigue, and loss of appetite are non-specific but if persist or she begins having dysuria we can work up further. A UTI would not explain the abdominal pain patient is having.    Carpal tunnel syndrome on both sides    right worse than left 2008   Cataract 06/11/2013   COPD (chronic obstructive pulmonary disease) (HCC)    COVID-19 virus infection 06/18/2016   Eczema 05/02/2018   Food poisoning 04/04/2021   GERD (gastroesophageal reflux disease)    Hair loss 07/07/2019   Hyperlipidemia    LDL 108 JULY 2013   IBS (irritable bowel syndrome)    Macular rash 05/20/2019   Multiple nevi 03/01/2015   Pneumonia    Positive TB test    From MArch 2009 note : Recent F/u at [facility]. CXRAy negative.  Positive TST in 2007 (14mm).  Unable to tolerate INH due to hepatotoxicity   Primary insomnia 06/05/2012   Skin macule 04/21/2019   Tinnitus 07/07/2019   URI (upper respiratory infection) 11/01/2010   Qualifier: Diagnosis of  By: Gladis FNP, Delorise  Tobacco History: Social History   Tobacco Use  Smoking Status Former   Current packs/day: 0.00   Average packs/day: 0.3 packs/day for 43.0 years (14.2 ttl pk-yrs)   Types: Cigarettes   Start date: 01/06/1970   Quit date: 01/06/2013   Years since quitting: 11.3  Smokeless Tobacco Never   Counseling given: Not Answered   Outpatient Medications Prior to Visit  Medication Sig Dispense Refill   albuterol  (VENTOLIN  HFA) 108 (90 Base) MCG/ACT inhaler Inhale 1 puff into the lungs every 6 (six) hours as needed for wheezing or shortness of breath. USE 2 INHALATIONS BY MOUTH EVERY 6  HOURS AS NEEDED FOR WHEEZING  OR SHORTNESS OF BREATH 26.8 g 2   carboxymethylcellul-glycerin (OPTIVE) 0.5-0.9 % ophthalmic solution 1 drop 4 (four) times daily as needed for dry eyes.     cholecalciferol  (VITAMIN D3) 25 MCG (1000 UNIT) tablet Take 1 tablet (1,000 Units total) by mouth daily. 90 tablet 3   cromolyn (OPTICROM) 4 % ophthalmic solution Place 1 drop into both eyes 2 (two) times daily.     fexofenadine  (ALLEGRA ) 180 MG tablet Take 1 tablet (180 mg total) by mouth daily. 90 tablet 1   metroNIDAZOLE (FLAGYL) 250 MG tablet Take 250 mg by mouth.     mometasone  (NASONEX ) 50 MCG/ACT nasal spray PLACE 2 SPRAYS IN EACH NOSTRIL ONCE DAILY AS NEEDED. 17 g 6   Multiple Vitamins-Minerals (MULTIVITAMIN WITH MINERALS) tablet Take 1 tablet by mouth daily.     pantoprazole  (PROTONIX ) 40 MG tablet Take 1 tablet (40 mg total) by mouth daily for 15 days.     XIFAXAN 550 MG TABS tablet Take 550 mg by mouth 3 (three) times daily.     Budeson-Glycopyrrol-Formoterol  (BREZTRI  AEROSPHERE) 160-9-4.8 MCG/ACT AERO Inhale 2 puffs into the lungs in the morning and at bedtime. 1 each 11   Evolocumab  (REPATHA  SURECLICK) 140 MG/ML SOAJ Inject 140 mg into the skin every 14 (fourteen) days. (Patient not taking: Reported on 05/11/2024) 6 mL 3   Evolocumab  (REPATHA  SURECLICK) 140 MG/ML SOAJ Inject 140 mg into the skin every 14 (fourteen) days. (Patient not taking: Reported on 05/11/2024) 1 mL 0   No facility-administered medications prior to visit.     Review of Systems:   Constitutional:   No  weight loss, night sweats,  Fevers, chills, or  lassitude.  Positive for excessive daytime sleepiness, daily naps, fatigue.  HEENT:   No headaches,  Difficulty swallowing,  Tooth/dental problems, or  Sore throat,                No sneezing, itching, ear ache, nasal congestion, post nasal drip,   CV:  No chest pain,  Orthopnea, PND, swelling in lower extremities, anasarca, dizziness, palpitations, syncope.   GI  No heartburn,  indigestion, abdominal pain, nausea, vomiting, diarrhea, change in bowel habits, loss of appetite, bloody stools.   Resp: No shortness of breath with exertion or at rest.  No excess mucus, no productive cough,  No non-productive cough,  No coughing up of blood.  No change in color of mucus.  No wheezing.  No chest wall deformity  Skin: no rash or lesions.  GU: no dysuria, change in color of urine, no urgency or frequency.  No flank pain, no hematuria   MS:  No joint pain or swelling.  No decreased range of motion.  No back pain.    Physical Exam  BP 111/75   Pulse 72   Temp 98.5 F (36.9 C) (Temporal)  Ht 5' 3 (1.6 m)   Wt 152 lb 12.8 oz (69.3 kg)   SpO2 97%   BMI 27.07 kg/m   GEN: A/Ox3; pleasant , NAD, well nourished    HEENT:  [place]/AT,  EACs-clear, TMs-wnl, NOSE-clear, THROAT-clear, no lesions, no postnasal drip or exudate noted.   NECK:  Supple w/ fair ROM; no JVD; normal carotid impulses w/o bruits; no thyromegaly or nodules palpated; no lymphadenopathy.    RESP  Clear  P & A; w/o, wheezes/ rales/ or rhonchi. no accessory muscle use, no dullness to percussion  CARD:  RRR, no m/r/g, no peripheral edema, pulses intact, no cyanosis or clubbing.  GI:   Soft & nt; nml bowel sounds; no organomegaly or masses detected.   Musco: Warm bil, no deformities or joint swelling noted.   Neuro: alert, no focal deficits noted.    Skin: Warm, no lesions or rashes    Lab Results:  CBC    Component Value Date/Time   WBC 2.5 (LL) 03/05/2024 1412   WBC 3.2 (L) 07/27/2023 0224   RBC 4.83 03/05/2024 1412   RBC 4.47 07/27/2023 0224   HGB 13.6 03/05/2024 1412   HCT 41.4 03/05/2024 1412   PLT 255 03/05/2024 1412   MCV 86 03/05/2024 1412   MCH 28.2 03/05/2024 1412   MCH 28.2 07/27/2023 0224   MCHC 32.9 03/05/2024 1412   MCHC 33.4 07/27/2023 0224   RDW 12.8 03/05/2024 1412   LYMPHSABS 0.7 03/05/2024 1412   MONOABS 0.4 07/27/2023 0224   EOSABS 0.0 03/05/2024 1412   BASOSABS  0.0 03/05/2024 1412    BMET    Component Value Date/Time   NA 141 03/05/2024 1412   K 4.3 03/05/2024 1412   CL 104 03/05/2024 1412   CO2 21 03/05/2024 1412   GLUCOSE 65 (L) 03/05/2024 1412   GLUCOSE 105 (H) 07/27/2023 0224   BUN 9 03/05/2024 1412   CREATININE 0.95 03/05/2024 1412   CREATININE 1.03 (H) 01/15/2019 1400   CREATININE 0.91 11/18/2014 1407   CALCIUM  10.0 03/05/2024 1412   GFRNONAA >60 07/27/2023 0224   GFRNONAA 57 (L) 01/15/2019 1400   GFRNONAA 69 11/18/2014 1407   GFRAA 90 11/07/2020 1510   GFRAA >60 01/15/2019 1400   GFRAA 79 11/18/2014 1407    BNP    Component Value Date/Time   BNP 14.5 01/07/2023 2229    ProBNP No results found for: PROBNP  Imaging: No results found.  Administration History     None          Latest Ref Rng & Units 07/21/2020    9:50 AM  PFT Results  FVC-Pre L 2.07   FVC-Predicted Pre % 83   FVC-Post L 2.29   FVC-Predicted Post % 92   Pre FEV1/FVC % % 57   Post FEV1/FCV % % 59   FEV1-Pre L 1.18   FEV1-Predicted Pre % 61   FEV1-Post L 1.34   DLCO uncorrected ml/min/mmHg 10.35   DLCO UNC% % 53   DLVA Predicted % 69   TLC L 5.00   TLC % Predicted % 100   RV % Predicted % 137     No results found for: NITRICOXIDE   Assessment & Plan:   Assessment & Plan Excessive daytime sleepiness - Plan for home sleep test; ordered today -Follow-up after sleep test completed for results review  Chronic obstructive pulmonary disease, unspecified COPD type (HCC) - Continue Breztri  tolerating well -Initially was concerned that this may be cost prohibitive however if  she uses the [employer] delivery service it is free -Samples of Breztri  given at today's appointment -Continue albuterol  as needed for shortness of breath.   Return in about 5 weeks (around 06/15/2024) for sleep study review.  Candis Dandy, PA-C 05/11/2024

## 2024-05-11 NOTE — Assessment & Plan Note (Signed)
-   Continue Breztri  tolerating well -Initially was concerned that this may be cost prohibitive however if she uses the [employer] delivery service it is free -Samples of Breztri  given at today's appointment -Continue albuterol  as needed for shortness of breath.

## 2024-05-11 NOTE — Addendum Note (Signed)
 Addended by: MELVENIA WILFORD SAUNDERS on: 05/11/2024 02:46 PM   Modules accepted: Orders

## 2024-05-14 NOTE — Progress Notes (Signed)
 Internal Medicine Attending:  I reviewed the AWV findings of the medical professional who conducted the visit. I was present in the office suite and immediately available to provide assistance and direction throughout the time the service was provided.

## 2024-05-22 ENCOUNTER — Ambulatory Visit: Payer: Self-pay

## 2024-05-22 NOTE — Telephone Encounter (Signed)
 FYI Only or Action Required?: Action required by provider: referral request.  Patient was last seen in primary care on 03/05/2024 by Tobie Gaines, DO.  Called Nurse Triage reporting Depression.  Symptoms began several days ago.  Interventions attempted: Nothing.  Symptoms are: gradually worsening.  Triage Disposition: See PCP When Office is Open (Within 3 Days)  Patient/caregiver understands and will follow disposition?: No, refuses disposition     Copied from CRM [id number]. Topic: Clinical - Red Word Triage >> May 22, 2024 10:23 AM Farrel B wrote: [facility] that prompted transfer to Nurse Triage: feeling over being overwhelmed, crying on the phone, feeling alone down Reason for Disposition  Requesting to talk with a counselor (mental health worker, psychiatrist, etc.)  Answer Assessment - Initial Assessment Questions Started a few days ago. Pt states she recently started an antibiotic for a bacterial infection. States she feels overwhelmed and has been tearful.  Patient requesting a one on one session to discuss her mental health and states she does not want to be on medications. Patient states she was scheduled to see a therapist but insurance did not cover the visit. Patient requesting a referral to another therapist who accepts her insurance. This RN gave the patient resources for mental health services and she declined.     1. CONCERN: What happened that made you call today?     Patient states she feels overwhelmed.  2. DEPRESSION SYMPTOM SCREENING: How are you feeling overall? (e.g., decreased energy, increased sleeping or difficulty sleeping, difficulty concentrating, feelings of sadness, guilt, hopelessness, or worthlessness)     Patient states she has been lashing out lately 3. RISK OF HARM - SUICIDAL IDEATION:  Do you ever have thoughts of hurting or killing yourself?  (e.g., yes, no, no but preoccupation with thoughts about death)     Denies  4. RISK OF HARM -  HOMICIDAL IDEATION:  Do you ever have thoughts of hurting or killing someone else?  (e.g., yes, no, no but preoccupation with thoughts about death)     Denies  5. FUNCTIONAL IMPAIRMENT: How have things been going for you overall? Have you had more difficulty than usual doing your normal daily activities?  (e.g., better, same, worse; self-care, school, work, interactions)     Patient states she is somewhat able to do everyday activities and is a little behind with things  6. SUPPORT: Who is with you now? Who do you live with? Do you have family or friends who you can talk to?      Lives alone  7. THERAPIST: Do you have a counselor or therapist? If Yes, ask: What is their name?     Denies  8. STRESSORS: Has there been any new stress or recent changes in your life?     Medical things  9. OTHER: Do you have any other physical symptoms right now? (e.g., fever)       Had some stomach pain this morning and a headache but that has resolved.  Protocols used: Depression-A-AH

## 2024-05-22 NOTE — Telephone Encounter (Signed)
 RTC to patient.  Stats is just feeling a little low today.  Did not sleep well last night.  Has a lot going on in her life.  Has been out this morning for a while and has decided  to come back home and rest.  Suggestion of getting referred to an outside therapist.  States her insurance will not cover.  Suggestion to have 1 of the doctors give her a call.  Does not want to schedule an appointment today.  Does not have any thoughts of harming herself.  Just wants to talk.

## 2024-05-25 NOTE — Telephone Encounter (Signed)
 RTC to patient.  Message was left that the Clinics had called to check on her and to make an appointment.

## 2024-05-25 NOTE — Telephone Encounter (Signed)
 Referral was re-directed due to ins per the patient:   Re directed Referral to   Re directed Referral to   [facility] [address] [place] [place],  [place]  [address]   Main: [phone]      Copied from CRM [id number]. Topic: Referral - Question >> May 22, 2024 10:18 AM Farrel B wrote: Reason for CRM: [phone] patient called to advise the referral that was sent out you weren't able to schedule the appt due to insurance not being accepted need to find provider that will accept insurance, NEED ASAP

## 2024-06-03 ENCOUNTER — Ambulatory Visit: Admitting: Physician Assistant

## 2024-06-04 ENCOUNTER — Telehealth: Payer: Self-pay

## 2024-06-04 NOTE — Patient Outreach (Signed)
 Complex Care Management Care Guide Note  06/04/2024 Name: Aimee Davis MRN: [id number] DOB: 25-Jan-1956  Aimee Davis is a 68 y.o. year old female who is a primary care patient of Tobie Gaines, DO and is actively engaged with the care management team. I reached out to Aimee Davis by phone today to assist with re-scheduling  with the Licensed Clinical Child psychotherapist.  Follow up plan: Unsuccessful telephone outreach attempt made. Unable to leave A HIPAA compliant phone message for the patient providing contact information and requesting a return call.  Shereen Gin [facility]  Population Health VBCI Assistant Direct Dial: [phone]  Fax: [phone] Website: [email or web address]

## 2024-06-08 ENCOUNTER — Telehealth: Payer: Self-pay | Admitting: *Deleted

## 2024-06-08 NOTE — Telephone Encounter (Signed)
 Copied from CRM [id number]. Topic: Referral - Question >> Jun 05, 2024 10:45 AM Susanna ORN wrote: Reason for CRM: Patient is requesting that nurse Shanetta Nicolls give her a call back today. She states it's in regards to a referral. CB #: [phone].

## 2024-06-08 NOTE — Telephone Encounter (Signed)
Return pt's call - no answer; left message.

## 2024-06-09 ENCOUNTER — Ambulatory Visit: Admitting: Student

## 2024-06-09 ENCOUNTER — Ambulatory Visit: Payer: Self-pay | Admitting: Student

## 2024-06-09 ENCOUNTER — Encounter: Payer: Self-pay | Admitting: Student

## 2024-06-09 VITALS — BP 129/69 | HR 80 | Temp 97.8°F | Ht 63.0 in | Wt 150.0 lb

## 2024-06-09 DIAGNOSIS — D72819 Decreased white blood cell count, unspecified: Secondary | ICD-10-CM | POA: Diagnosis not present

## 2024-06-09 DIAGNOSIS — K638219 Small intestinal bacterial overgrowth, unspecified: Secondary | ICD-10-CM | POA: Diagnosis not present

## 2024-06-09 DIAGNOSIS — R5383 Other fatigue: Secondary | ICD-10-CM | POA: Diagnosis not present

## 2024-06-09 DIAGNOSIS — J302 Other seasonal allergic rhinitis: Secondary | ICD-10-CM

## 2024-06-09 DIAGNOSIS — Z23 Encounter for immunization: Secondary | ICD-10-CM | POA: Diagnosis not present

## 2024-06-09 LAB — TECHNOLOGIST SMEAR REVIEW: Plt Morphology: NORMAL

## 2024-06-09 LAB — CBC WITH DIFFERENTIAL/PLATELET
Abs Immature Granulocytes: 0.01 K/uL (ref 0.00–0.07)
Basophils Absolute: 0 K/uL (ref 0.0–0.1)
Basophils Relative: 1 %
Eosinophils Absolute: 0 K/uL (ref 0.0–0.5)
Eosinophils Relative: 1 %
HCT: 39.6 % (ref 36.0–46.0)
Hemoglobin: 12.8 g/dL (ref 12.0–15.0)
Immature Granulocytes: 0 %
Lymphocytes Relative: 19 %
Lymphs Abs: 0.6 K/uL — ABNORMAL LOW (ref 0.7–4.0)
MCH: 28.1 pg (ref 26.0–34.0)
MCHC: 32.3 g/dL (ref 30.0–36.0)
MCV: 86.8 fL (ref 80.0–100.0)
Monocytes Absolute: 0.5 K/uL (ref 0.1–1.0)
Monocytes Relative: 14 %
Neutro Abs: 2.1 K/uL (ref 1.7–7.7)
Neutrophils Relative %: 65 %
Platelets: 235 K/uL (ref 150–400)
RBC: 4.56 MIL/uL (ref 3.87–5.11)
RDW: 13.4 % (ref 11.5–15.5)
WBC: 3.3 K/uL — ABNORMAL LOW (ref 4.0–10.5)
nRBC: 0 % (ref 0.0–0.2)

## 2024-06-09 MED ORDER — MOMETASONE FUROATE 50 MCG/ACT NA SUSP: NASAL | 6 refills | Status: AC

## 2024-06-09 MED ORDER — BREZTRI AEROSPHERE 160-9-4.8 MCG/ACT IN AERO: 2.0000 | INHALATION_SPRAY | Freq: Two times a day (BID) | RESPIRATORY_TRACT | 3 refills | Status: DC

## 2024-06-09 NOTE — Progress Notes (Signed)
 CC: Fatigue Follow up   HPI:  Aimee Davis is a 68 y.o. female with a past medical history of CAD, COPD, GERD, overactive bladder who presents for follow-up appointment.  Please see assessment and plan for full HPI.   Medications: COPD: Albuterol , Breztri  Vitamin D  deficiency: Vitamin D  supplementation  Hyperlipidemia: Repatha  140 mg every 14 days Allergic rhinitis: Allegra  180 mg daily, mometasone  nasal spray GERD: Protonix  40 mg twice daily  Past Medical History:  Diagnosis Date   Abdominal pain 08/16/2008   Qualifier: Diagnosis of  By: Gladis FNP, [name]     Abdominal pain, epigastric 09/08/2019   Allergy    seasonal, PCN, antidepressants, bentyl    Aortic atherosclerosis 02/23/2021   Arthritis    Asthma    Asymptomatic bacteriuria 08/24/2021   UA showed leukocytes but patient does not have dysuria, fevers or hematuria. She also had bacteruria in her last UA. Her symptoms of fatigue, and loss of appetite are non-specific but if persist or she begins having dysuria we can work up further. A UTI would not explain the abdominal pain patient is having.    Carpal tunnel syndrome on both sides    right worse than left 2008   Cataract 06/11/2013   COPD (chronic obstructive pulmonary disease) (HCC)    COVID-19 virus infection 06/18/2016   Eczema 05/02/2018   Food poisoning 04/04/2021   GERD (gastroesophageal reflux disease)    Hair loss 07/07/2019   Hyperlipidemia    LDL 108 JULY 2013   IBS (irritable bowel syndrome)    Macular rash 05/20/2019   Multiple nevi 03/01/2015   Pneumonia    Positive TB test    From MArch 2009 note : Recent F/u at [facility]. CXRAy negative.  Positive TST in 2007 (14mm).  Unable to tolerate INH due to hepatotoxicity   Primary insomnia 06/05/2012   Skin macule 04/21/2019   Tinnitus 07/07/2019   URI (upper respiratory infection) 11/01/2010   Qualifier: Diagnosis of  By: Gladis FNP, Delorise       Current Outpatient Medications:    albuterol  (VENTOLIN  HFA)  108 (90 Base) MCG/ACT inhaler, Inhale 1 puff into the lungs every 6 (six) hours as needed for wheezing or shortness of breath. USE 2 INHALATIONS BY MOUTH EVERY 6 HOURS AS NEEDED FOR WHEEZING  OR SHORTNESS OF BREATH, Disp: 26.8 g, Rfl: 2   budesonide-glycopyrrolate-formoterol  (BREZTRI  AEROSPHERE) 160-9-4.8 MCG/ACT AERO inhaler, Inhale 2 puffs into the lungs in the morning and at bedtime., Disp: 10.7 g, Rfl: 11   budesonide-glycopyrrolate-formoterol  (BREZTRI  AEROSPHERE) 160-9-4.8 MCG/ACT AERO inhaler, Inhale 2 puffs into the lungs in the morning and at bedtime., Disp: 10.7 g, Rfl: 3   carboxymethylcellul-glycerin (OPTIVE) 0.5-0.9 % ophthalmic solution, 1 drop 4 (four) times daily as needed for dry eyes., Disp: , Rfl:    cholecalciferol  (VITAMIN D3) 25 MCG (1000 UNIT) tablet, Take 1 tablet (1,000 Units total) by mouth daily., Disp: 90 tablet, Rfl: 3   cromolyn (OPTICROM) 4 % ophthalmic solution, Place 1 drop into both eyes 2 (two) times daily., Disp: , Rfl:    Evolocumab  (REPATHA  SURECLICK) 140 MG/ML SOAJ, Inject 140 mg into the skin every 14 (fourteen) days. (Patient not taking: Reported on 05/11/2024), Disp: 6 mL, Rfl: 3   Evolocumab  (REPATHA  SURECLICK) 140 MG/ML SOAJ, Inject 140 mg into the skin every 14 (fourteen) days. (Patient not taking: Reported on 05/11/2024), Disp: 1 mL, Rfl: 0   fexofenadine  (ALLEGRA ) 180 MG tablet, Take 1 tablet (180 mg total) by mouth daily., Disp:  90 tablet, Rfl: 1   mometasone  (NASONEX ) 50 MCG/ACT nasal spray, PLACE 2 SPRAYS IN EACH NOSTRIL ONCE DAILY AS NEEDED., Disp: 17 g, Rfl: 6   Multiple Vitamins-Minerals (MULTIVITAMIN WITH MINERALS) tablet, Take 1 tablet by mouth daily., Disp: , Rfl:    pantoprazole  (PROTONIX ) 40 MG tablet, Take 1 tablet (40 mg total) by mouth daily for 15 days., Disp: , Rfl:   Review of Systems:    Negative except fro what is stated in HPI.   Physical Exam:  Vitals:   06/09/24 0943  BP: 129/69  Pulse: 80  Temp: 97.8 F (36.6 C)  TempSrc:  Oral  SpO2: 98%  Weight: 150 lb (68 kg)  Height: 5' 3 (1.6 m)   General: Patient is sitting comfortably in the room  Head: Normocephalic, atraumatic  Cardio: Regular rate and rhythm, no murmurs, rubs or gallop Pulmonary: Clear to ausculation bilaterally with no rales, rhonchi, and crackles    Assessment & Plan:   Fatigue Patient follows up today as patient is being followed for fatigue.  During patient's clinic course, she has been worked up for iron deficiency, hypothyroidism, anemia, and adrenal insufficiency.  Labs workup has been negative.  Still awaiting sleep study.  Query if patient has fatigue secondary to her mood.  I do also wonder if this was related to her small intestinal bacterial overgrowth as she has improved since her last visit.  She was treated with Xifaxan. Will have patient reach out to pulmonologist to see if we can schedule the sleep study as she has already had her initial visit with them.  Patient does not want to start medications for depression, but would like like to have a counselor.  Instructed patient to use psychology today as the place that I have referred patient to did not take her insurance.  She also was instructed to call her insurance company to see what counseling group is covered by her insurance.  Plan: - Follow-up sleep study results - Patient instructed to find counselor, resources provided - Follow-up CBC  Leukopenia Follow-up CBC.  Patient has been followed up for this in the past and bone marrow biopsy was recommended.  Can evaluate further after CBC with differential and smear ordered.  Plan: - Follow-up CBC and smear  Flu vaccine need Flu vaccine administered today.  Small intestinal bacterial overgrowth (SIBO) Patient recently diagnosed with SIBO.  Wonder if this is contributing to her fatigue.  Treated with Xifaxan.  Patient improved.    Plan: - Continue follow-up with GI - No acute concerns today  Patient discussed with Dr.  Mliss Foot  Libby Blanch, DO Internal Medicine Resident PGY-3

## 2024-06-09 NOTE — Patient Outreach (Signed)
 Complex Care Management Care Guide Note  06/09/2024 Name: Aimee Davis MRN: [id number] DOB: 05-Dec-1955  Aimee Davis is a 68 y.o. year old female who is a primary care patient of Tobie Gaines, DO and is actively engaged with the care management team. I reached out to Aimee Davis by phone today to assist with re-scheduling  with the Licensed Clinical Child psychotherapist.  Follow up plan: 3rd Unsuccessful telephone outreach attempt made. A HIPAA compliant phone message was left for the patient providing contact information and requesting a return call. No further outreach attempts will be made at this time.  Shereen Gin [facility]  Population Health VBCI Assistant Direct Dial: [phone]  Fax: [phone] Website: [email or web address]

## 2024-06-09 NOTE — Assessment & Plan Note (Signed)
 Follow-up CBC.  Patient has been followed up for this in the past and bone marrow biopsy was recommended.  Can evaluate further after CBC with differential and smear ordered.  Plan: - Follow-up CBC and smear

## 2024-06-09 NOTE — Assessment & Plan Note (Signed)
 Flu vaccine administered today.

## 2024-06-09 NOTE — Patient Instructions (Addendum)
 Aimee Davis,Thank you for allowing me to take part in your care today.  Here are your instructions.  1.  Please go to googlel and look up psychology today.  Please type in your ZIP Code and this will show you therapists in your area.  Please also call your insurance to see what therapists your insurance covers.  2.  Call your pulmonologist office next week and see if you can schedule your home sleep test.  3.  I am checking labs today.  I will call you with results.  4. Please come back in 3 months and we can see how you are doing.   5.  You received your flu vaccine today, if you develop fevers or chills, please take Tylenol .  PLEASE BRING YOUR MEDICATIONS TO EVERY APPOINTMENT  Thank you, Dr. Tobie  If you have any other questions please contact the internal medicine clinic at [phone] If it is after hours, please call the [facility] at [phone] and then ask the person who picks up for the resident on call.

## 2024-06-09 NOTE — Progress Notes (Signed)
 Internal Medicine Clinic Attending  Case discussed with the resident at the time of the visit.  We reviewed the resident's history and exam and pertinent patient test results.  I agree with the assessment, diagnosis, and plan of care documented in the resident's note.

## 2024-06-09 NOTE — Assessment & Plan Note (Addendum)
 Patient follows up today as patient is being followed for fatigue.  During patient's clinic course, she has been worked up for iron deficiency, hypothyroidism, anemia, and adrenal insufficiency.  Labs workup has been negative.  Still awaiting sleep study.  Query if patient has fatigue secondary to her mood.  I do also wonder if this was related to her small intestinal bacterial overgrowth as she has improved since her last visit.  She was treated with Xifaxan. Will have patient reach out to pulmonologist to see if we can schedule the sleep study as she has already had her initial visit with them.  Patient does not want to start medications for depression, but would like like to have a counselor.  Instructed patient to use psychology today as the place that I have referred patient to did not take her insurance.  She also was instructed to call her insurance company to see what counseling group is covered by her insurance.  Plan: - Follow-up sleep study results - Patient instructed to find counselor, resources provided - Follow-up CBC

## 2024-06-09 NOTE — Assessment & Plan Note (Signed)
 Patient recently diagnosed with SIBO.  Wonder if this is contributing to her fatigue.  Treated with Xifaxan.  Patient improved.    Plan: - Continue follow-up with GI - No acute concerns today

## 2024-06-11 ENCOUNTER — Telehealth: Payer: Self-pay

## 2024-06-11 NOTE — Telephone Encounter (Signed)
 Called pt to  ask a few questions  for COPD   and make her appt ... Please forward call to me ... Thanks in advance

## 2024-06-16 ENCOUNTER — Encounter (HOSPITAL_BASED_OUTPATIENT_CLINIC_OR_DEPARTMENT_OTHER): Payer: Self-pay

## 2024-07-20 ENCOUNTER — Encounter: Payer: Self-pay | Admitting: Radiology

## 2024-07-29 ENCOUNTER — Ambulatory Visit: Payer: Self-pay

## 2024-07-29 NOTE — Patient Instructions (Addendum)
 Thank you, Ms.Jessieca C Shilling for allowing us  to provide your care today. Today we discussed your seasonal affective disorder.  This commonly happens during the darker, winter months, and the best way to treat it is through light therapy! There are many types of lights or lamp you can purchase, but [employer], [employer], [employer], or [employer] have these lights that are helpful for these darker months. Google light for seasonal affective disorder and many different options will be there for you. Keep exercising and walking when the sun is out!  Peel and stick wall paper to cover the orange colored walls you have at home. This can be purchased at any [employer] or [employer]. Make sure to ask your landlord about this option to help with your mood!   Please follow-up in: 2-3 weeks to see how your symptoms are doing    We look forward to seeing you next time. Please call our clinic at [phone] if you have any questions or concerns. The best time to call is Monday-Friday from 9am-4pm, but there is someone available 24/7. If after hours or the weekend, call the main hospital number and ask for the Internal Medicine Resident On-Call. If you need medication refills, please notify your pharmacy one week in advance and they will send us  a request.   Thank you for letting us  take part in your care. Wishing you the best!  Sanjana Folz, DO 07/30/2024, 9:50 AM [facility]

## 2024-07-29 NOTE — Telephone Encounter (Signed)
    Copied from CRM [id number]. Topic: Clinical - Medical Advice >> Jul 29, 2024  1:41 PM Suzette B wrote: Reason for CRM: Patient called in stating she wanted to schedule an appt she had not been in a while to see her pcp. I was able to schedule her for an appt with her pcp for December. Pt is no requesting a nurse call her back so she can discuss what's going on medically with her. I advised the patient I had scheduled an appt for her to see the provider so she's able to discuss how she's feeling, what's going on, she states she wants the nurse to call her back so she can go over everything with the nurse. She did not disclose what was wrong. >> Jul 29, 2024  1:46 PM Mercer PEDLAR wrote: Patient is calling to confirm phone number for callback.

## 2024-07-29 NOTE — Progress Notes (Signed)
 Established Patient Office Visit  Subjective   Patient ID: Aimee Davis, female    DOB: [birth year]  Age: 68 y.o. MRN: [id number]  Chief Complaint  Patient presents with   Acute Visit    PER E2C2 .SABRADepression  several days ago.   Interventions attempted: Nothing.   Symptoms are: unchanged When did the confusion start? (e.g., minutes, hours, days) Last 3 days, only when wake up in the morning, then I feel down feeling. I don't have any reason to feel down. 3. PATTERN: Does this come and go, or has it been constant since it started? Is it present now? Comes and goes; makes me feel like I just want to cry      Aimee Davis is a 68 year old female with a past medical history of CAD, COPD and emphysema, GERD, SIBO, MDD and anxiety who presents to the office today to discuss concerns she has regarding new changes in her mood. She notes she has become more irritated and felt off/anxious lately, making her feel down without reason. She feels this may be related to seasonal affective disorder. Please see problemp-based assessment and plan below for details.    Review of Systems  Constitutional:  Positive for malaise/fatigue.  HENT: Negative.    Eyes: Negative.   Respiratory: Negative.    Cardiovascular: Negative.   Gastrointestinal: Negative.   Genitourinary: Negative.   Musculoskeletal: Negative.   Neurological: Negative.   Psychiatric/Behavioral:  The patient is nervous/anxious.        Changes in mood-- feels down and more irritatble. Loss of energy      Objective:    BP 108/74 (BP Location: Left Arm, Patient Position: Sitting, Cuff Size: Large)   Pulse 79   Temp 97.9 F (36.6 C) (Oral)   Ht 5' 3 (1.6 m)   BMI 26.57 kg/m  Physical Exam Constitutional:      Appearance: Normal appearance. She is normal weight.  HENT:     Mouth/Throat:     Mouth: Mucous membranes are moist.  Cardiovascular:     Rate and Rhythm: Normal rate and regular rhythm.     Pulses:  Normal pulses.     Heart sounds: Normal heart sounds.  Pulmonary:     Effort: Pulmonary effort is normal.     Breath sounds: Normal breath sounds.  Abdominal:     General: Abdomen is flat.     Palpations: Abdomen is soft.  Musculoskeletal:        General: Normal range of motion.  Skin:    General: Skin is warm.  Neurological:     General: No focal deficit present.     Mental Status: She is alert and oriented to person, place, and time.  Psychiatric:        Mood and Affect: Mood normal.        Behavior: Behavior normal.     The ASCVD Risk score (Arnett DK, et al., 2019) failed to calculate for the following reasons:   The valid HDL cholesterol range is 20 to 100 mg/dL    Assessment & Plan:   Patient seen with Dr. Jeanelle.   Problem List Items Addressed This Visit       Other   Seasonal affective disorder - Primary   Patient has noticed her mood has been down over the last few days. She notes she has no reason to feel down, but will concurrently feel more anxious and irritated, making her mood off. This happened last year around this time  as well. Discussed seasonal affective disorder and how it commonly affects people during the winter months, especially after the yearly time change. Counseled how symptoms include feeling down with depressed mood, loss of interest, or low energy, and how symptoms tend to affect women more often.  Discussed treatment options, including light therapy, SSRIs, CBT, and modifications that include her increasing her exposure to sunlight and exercise. Patient will opt to try light therapy with lamps and changing her light bulbs, and other modes of therapy like changing her wallpaper to brighten her apartment to lift her mood. She also will go to the gym and walk outside more when the sun is out to get more exposure. Informed patient to return in 2 weeks if she noticed her mood is not changing to consider other additional therapy, like SSRIs or CBT.  Patient is agreeable to plan.  - Follow up in 2 weeks  - Gave information on light therapy       Return in about 2 weeks (around 08/13/2024) for check on mood.    Sinclair Arrazola, DO Internal Medicine Resident, PGY-1 11:43 AM 07/30/2024

## 2024-07-29 NOTE — Telephone Encounter (Signed)
 FYI Only or Action Required?: FYI only for provider: appointment scheduled on 07/30/24.  Patient was last seen in primary care on 06/09/2024 by Tobie Gaines, DO.  Called Nurse Triage reporting Altered Mental Status.  Symptoms began several days ago.  Interventions attempted: Nothing.  Symptoms are: unchanged.  Triage Disposition: See PCP When Office is Open (Within 3 Days)  Patient/caregiver understands and will follow disposition?: Yes   Reason for Disposition  MODERATE anxiety (e.g., persistent or frequent anxiety symptoms; interferes with sleep, school, or work)  Answer Assessment - Initial Assessment Questions No available appts with PCP.  1. LEVEL OF CONSCIOUSNESS: How are they (the patient) acting right now? (e.g., alert-oriented, confused, lethargic, stuporous, comatose)     I know I was at home, but my mind wasn't clear. Patient reports mood is off(not physically confused) Denies HA, dizziness 2. ONSET: When did the confusion start?  (e.g., minutes, hours, days)    Last 3 days, only when wake up in the morning, then I feel down feeling. I don't have any reason to feel down. 3. PATTERN: Does this come and go, or has it been constant since it started?  Is it present now?     Comes and goes; makes me feel like I just want to cry  5. NARCOTIC MEDICINES: Have they been receiving any narcotic medications? (e.g., morphine, Vicodin)     no 6. CAUSE: What do you think is causing the confusion?      Thinks it's seasonal, I have to go outside and see if I'm going to feel better, getting out of the house. I think it happened last year too, think its a seasonal disorder. 7. OTHER SYMPTOMS: Are there any other symptoms? (e.g., difficulty breathing, fever, headache, weakness)    Denies  dizziness, HA, blurred vision, fever chills, diff breath, chest pain Patient reports no to hurting myself or others. Patient reports is safe. I been feeling irritated, I don't lkie the way  I feel.  Answer Assessment - Initial Assessment Questions No available appts with PCP today. Scheduled 07/30/24.  Nurse provided [facility] Resources: [facility] and [facility] 24 hour help line  Advised call back or [facility] if symptoms worsen.  1. LEVEL OF CONSCIOUSNESS: How are they (the patient) acting right now? (e.g., alert-oriented, confused, lethargic, stuporous, comatose)     I know I was at home, but my mind wasn't clear. Patient reports mood is off(not physically confused) Denies HA, dizziness 2. ONSET: When did the confusion start?  (e.g., minutes, hours, days)    Last 3 days, only when wake up in the morning, then I feel down feeling. I don't have any reason to feel down. 3. PATTERN: Does this come and go, or has it been constant since it started?  Is it present now?     Comes and goes; makes me feel like I just want to cry  5. NARCOTIC MEDICINES: Have they been receiving any narcotic medications? (e.g., morphine, Vicodin)     no 6. CAUSE: What do you think is causing the confusion?      Thinks it's seasonal, I have to go outside and see if I'm going to feel better, getting out of the house. I think it happened last year too, think its a seasonal disorder. 7. OTHER SYMPTOMS: Are there any other symptoms? (e.g., difficulty breathing, fever, headache, weakness)    Denies  dizziness, HA, blurred vision, fever chills, diff breath, chest pain Patient reports no to hurting myself or others. Patient reports is  safe. I been feeling irritated, I don't lkie the way I feel.  Protocols used: Confusion - Delirium-A-AH, Anxiety and Panic Attack-A-AH

## 2024-07-30 ENCOUNTER — Ambulatory Visit (INDEPENDENT_AMBULATORY_CARE_PROVIDER_SITE_OTHER)

## 2024-07-30 ENCOUNTER — Other Ambulatory Visit: Payer: Self-pay

## 2024-07-30 VITALS — BP 108/74 | HR 79 | Temp 97.9°F | Ht 63.0 in

## 2024-07-30 DIAGNOSIS — F338 Other recurrent depressive disorders: Secondary | ICD-10-CM | POA: Diagnosis not present

## 2024-07-30 NOTE — Assessment & Plan Note (Addendum)
 Patient has noticed her mood has been down over the last few days. She notes she has no reason to feel down, but will concurrently feel more anxious and irritated, making her mood off. This happened last year around this time as well. Discussed seasonal affective disorder and how it commonly affects people during the winter months, especially after the yearly time change. Counseled how symptoms include feeling down with depressed mood, loss of interest, or low energy, and how symptoms tend to affect women more often.  Discussed treatment options, including light therapy, SSRIs, CBT, and modifications that include her increasing her exposure to sunlight and exercise. Patient will opt to try light therapy with lamps and changing her light bulbs, and other modes of therapy like changing her wallpaper to brighten her apartment to lift her mood. She also will go to the gym and walk outside more when the sun is out to get more exposure. Informed patient to return in 2 weeks if she noticed her mood is not changing to consider other additional therapy, like SSRIs or CBT. Patient is agreeable to plan.  - Follow up in 2 weeks  - Gave information on light therapy

## 2024-07-30 NOTE — Progress Notes (Signed)
 Internal Medicine Clinic Attending  I was physically present during the key portions of the resident provided service and participated in the medical decision making of patient's management care. I reviewed pertinent patient test results.  The assessment, diagnosis, and plan were formulated together and I agree with the documentation in the resident's note.  Jeanelle Layman CROME, MD

## 2024-08-04 ENCOUNTER — Ambulatory Visit: Payer: Self-pay

## 2024-08-04 ENCOUNTER — Institutional Professional Consult (permissible substitution): Payer: 59 | Admitting: Psychology

## 2024-08-11 ENCOUNTER — Encounter: Payer: 59 | Admitting: Psychology

## 2024-08-11 ENCOUNTER — Other Ambulatory Visit (HOSPITAL_COMMUNITY): Payer: Self-pay

## 2024-08-12 ENCOUNTER — Telehealth: Payer: Self-pay | Admitting: Pharmacy Technician

## 2024-08-12 DIAGNOSIS — E782 Mixed hyperlipidemia: Secondary | ICD-10-CM

## 2024-08-12 NOTE — Telephone Encounter (Signed)
 Left message stating that she is overdue for a f/u appt and also needs labs drawn for PA for Repatha   Left call back number and will send a mychart message

## 2024-08-12 NOTE — Telephone Encounter (Signed)
 Hi, insurance is asking for updated labs. Patient started repatha  in April and last lipid labs were before she started repatha . Can she please get updated lipid labs so we can finish this prior authorization for repatha ? Thank you

## 2024-08-12 NOTE — Telephone Encounter (Signed)
 Caller Melvena) called to follow-up on patient's prior authorization for Evolocumab  (REPATHA  SURECLICK) 140 MG/ML SOAJ.  Caller stated can reach her at [phone] and reference prescription# [id number]

## 2024-08-18 ENCOUNTER — Emergency Department (HOSPITAL_BASED_OUTPATIENT_CLINIC_OR_DEPARTMENT_OTHER)
Admission: EM | Admit: 2024-08-18 | Discharge: 2024-08-18 | Disposition: A | Discharge status: Home / Self Care | Source: Ambulatory Visit | Attending: Emergency Medicine | Admitting: Emergency Medicine

## 2024-08-18 ENCOUNTER — Telehealth: Payer: Self-pay | Admitting: Student

## 2024-08-18 ENCOUNTER — Emergency Department (HOSPITAL_BASED_OUTPATIENT_CLINIC_OR_DEPARTMENT_OTHER)

## 2024-08-18 ENCOUNTER — Encounter (HOSPITAL_BASED_OUTPATIENT_CLINIC_OR_DEPARTMENT_OTHER): Payer: Self-pay | Admitting: Emergency Medicine

## 2024-08-18 ENCOUNTER — Other Ambulatory Visit (HOSPITAL_BASED_OUTPATIENT_CLINIC_OR_DEPARTMENT_OTHER): Payer: Self-pay

## 2024-08-18 ENCOUNTER — Other Ambulatory Visit: Payer: Self-pay

## 2024-08-18 ENCOUNTER — Other Ambulatory Visit (HOSPITAL_COMMUNITY): Payer: Self-pay

## 2024-08-18 DIAGNOSIS — I2510 Atherosclerotic heart disease of native coronary artery without angina pectoris: Secondary | ICD-10-CM | POA: Diagnosis not present

## 2024-08-18 DIAGNOSIS — Z8616 Personal history of COVID-19: Secondary | ICD-10-CM | POA: Diagnosis not present

## 2024-08-18 DIAGNOSIS — J449 Chronic obstructive pulmonary disease, unspecified: Secondary | ICD-10-CM | POA: Insufficient documentation

## 2024-08-18 DIAGNOSIS — M545 Low back pain, unspecified: Secondary | ICD-10-CM | POA: Diagnosis present

## 2024-08-18 DIAGNOSIS — R109 Unspecified abdominal pain: Secondary | ICD-10-CM | POA: Insufficient documentation

## 2024-08-18 DIAGNOSIS — Z87891 Personal history of nicotine dependence: Secondary | ICD-10-CM | POA: Insufficient documentation

## 2024-08-18 LAB — CBC WITH DIFFERENTIAL/PLATELET
Abs Immature Granulocytes: 0.01 K/uL (ref 0.00–0.07)
Basophils Absolute: 0 K/uL (ref 0.0–0.1)
Basophils Relative: 0 %
Eosinophils Absolute: 0 K/uL (ref 0.0–0.5)
Eosinophils Relative: 1 %
HCT: 36.6 % (ref 36.0–46.0)
Hemoglobin: 12.1 g/dL (ref 12.0–15.0)
Immature Granulocytes: 0 %
Lymphocytes Relative: 28 %
Lymphs Abs: 0.8 K/uL (ref 0.7–4.0)
MCH: 27.6 pg (ref 26.0–34.0)
MCHC: 33.1 g/dL (ref 30.0–36.0)
MCV: 83.4 fL (ref 80.0–100.0)
Monocytes Absolute: 0.4 K/uL (ref 0.1–1.0)
Monocytes Relative: 13 %
Neutro Abs: 1.6 K/uL — ABNORMAL LOW (ref 1.7–7.7)
Neutrophils Relative %: 58 %
Platelets: 232 K/uL (ref 150–400)
RBC: 4.39 MIL/uL (ref 3.87–5.11)
RDW: 12.8 % (ref 11.5–15.5)
WBC: 2.8 K/uL — ABNORMAL LOW (ref 4.0–10.5)
nRBC: 0 % (ref 0.0–0.2)

## 2024-08-18 LAB — URINALYSIS, W/ REFLEX TO CULTURE (INFECTION SUSPECTED)
Bacteria, UA: NONE SEEN
Bilirubin Urine: NEGATIVE
Glucose, UA: NEGATIVE mg/dL
Hgb urine dipstick: NEGATIVE
Ketones, ur: NEGATIVE mg/dL
Nitrite: NEGATIVE
Protein, ur: NEGATIVE mg/dL
Specific Gravity, Urine: 1.009 (ref 1.005–1.030)
pH: 5.5 (ref 5.0–8.0)

## 2024-08-18 LAB — BASIC METABOLIC PANEL WITH GFR
Anion gap: 10 (ref 5–15)
BUN: 11 mg/dL (ref 8–23)
CO2: 25 mmol/L (ref 22–32)
Calcium: 10 mg/dL (ref 8.9–10.3)
Chloride: 105 mmol/L (ref 98–111)
Creatinine, Ser: 0.78 mg/dL (ref 0.44–1.00)
GFR, Estimated: >60 mL/min (ref >60)
Glucose, Bld: 91 mg/dL (ref 70–99)
Potassium: 3.8 mmol/L (ref 3.5–5.1)
Sodium: 140 mmol/L (ref 135–145)

## 2024-08-18 MED ORDER — METHYLPREDNISOLONE 4 MG PO TBPK
ORAL_TABLET | ORAL | 0 refills | Status: DC
  Filled 2024-08-18: qty 21, 6d supply, fill #0

## 2024-08-18 MED ORDER — METHOCARBAMOL 500 MG PO TABS
500.0000 mg | ORAL_TABLET | Freq: Two times a day (BID) | ORAL | 0 refills | Status: DC
  Filled 2024-08-18: qty 20, 10d supply, fill #0

## 2024-08-18 MED ORDER — ACETAMINOPHEN 500 MG PO TABS
1000.0000 mg | ORAL_TABLET | Freq: Once | ORAL | Status: AC
  Administered 2024-08-18: 1000 mg via ORAL
  Filled 2024-08-18: qty 2

## 2024-08-18 MED ORDER — FAMOTIDINE 20 MG PO TABS
20.0000 mg | ORAL_TABLET | Freq: Every day | ORAL | 0 refills | Status: AC
  Filled 2024-08-18: qty 14, 14d supply, fill #0

## 2024-08-18 MED ORDER — LIDOCAINE 5 % EX PTCH
1.0000 | MEDICATED_PATCH | CUTANEOUS | Status: DC
  Administered 2024-08-18: 1 via TRANSDERMAL
  Filled 2024-08-18: qty 1

## 2024-08-18 MED ORDER — IOHEXOL 300 MG/ML  SOLN
100.0000 mL | Freq: Once | INTRAMUSCULAR | Status: AC | PRN
  Administered 2024-08-18: 100 mL via INTRAVENOUS

## 2024-08-18 MED ORDER — METHOCARBAMOL 500 MG PO TABS
500.0000 mg | ORAL_TABLET | Freq: Once | ORAL | Status: AC
  Administered 2024-08-18: 500 mg via ORAL
  Filled 2024-08-18: qty 1

## 2024-08-18 MED ORDER — IBUPROFEN 800 MG PO TABS
800.0000 mg | ORAL_TABLET | Freq: Once | ORAL | Status: AC
  Administered 2024-08-18: 800 mg via ORAL
  Filled 2024-08-18: qty 1

## 2024-08-18 NOTE — Discharge Instructions (Signed)
 Your CT scan showed some bulging disks in your back.  These can cause some pain and discomfort.  We treated them with medications like steroids.  Take the steroid pack that you are prescribed as instructed by dose packaging.  You may also take Tylenol .  I have sent you a muscle relaxer medicine called Robaxin  that you may take for worsening pain.  Do not drive if you are taking this medication as it may make you sleepy.  I have also sent you a medicine called famotidine  to help protect your stomach while you are taking steroids.  Call your primary care doctor this week to set up a follow-up appointment.  You can discuss things like physical therapy which can be helpful for managing your pain.

## 2024-08-18 NOTE — ED Provider Notes (Addendum)
 [name] EMERGENCY DEPARTMENT AT [facility] Provider Note  CSN: [id number] Arrival date & time: 08/18/24 9190  Chief Complaint(s) Back Pain  HPI Aimee Davis is a 68 y.o. female who is here today for back pain.  She states that she started to have some bilateral pain in her mid lower back.  It is bilateral, worse with movement.  She says that earlier this evening she was doing some writing, moved a pencil in a book, and then went to bed and felt like her back was bothering her.  She has at times had trouble with her back in the past, but says it gets better with a heating pad.  She has never had symptoms like this.  She denies any abdominal pain or dysuria.   Past Medical History Past Medical History:  Diagnosis Date   Abdominal pain 08/16/2008   Qualifier: Diagnosis of  By: Gladis FNP, [name]     Abdominal pain, epigastric 09/08/2019   Allergy    seasonal, PCN, antidepressants, bentyl    Aortic atherosclerosis 02/23/2021   Arthritis    Asthma    Asymptomatic bacteriuria 08/24/2021   UA showed leukocytes but patient does not have dysuria, fevers or hematuria. She also had bacteruria in her last UA. Her symptoms of fatigue, and loss of appetite are non-specific but if persist or she begins having dysuria we can work up further. A UTI would not explain the abdominal pain patient is having.    Carpal tunnel syndrome on both sides    right worse than left 2008   Cataract 06/11/2013   COPD (chronic obstructive pulmonary disease) (HCC)    COVID-19 virus infection 06/18/2016   Eczema 05/02/2018   Food poisoning 04/04/2021   GERD (gastroesophageal reflux disease)    Hair loss 07/07/2019   Hyperlipidemia    LDL 108 JULY 2013   IBS (irritable bowel syndrome)    Macular rash 05/20/2019   Multiple nevi 03/01/2015   Pneumonia    Positive TB test    From M[facility] 2009 note : Recent F/u at [facility]. CXRAy negative.  Positive TST in 2007 (14mm).  Unable to tolerate INH due to hepatotoxicity    Primary insomnia 06/05/2012   Skin macule 04/21/2019   Tinnitus 07/07/2019   URI (upper respiratory infection) 11/01/2010   Qualifier: Diagnosis of  By: Gladis FNP, Delorise     Patient Active Problem List   Diagnosis Date Noted   Seasonal affective disorder 07/30/2024   Flu vaccine need 06/09/2024   Small intestinal bacterial overgrowth (SIBO) 06/09/2024   Anxiety 02/21/2024   Brain fog 02/21/2024   Dysuria 10/31/2023   Elevated TSH 07/31/2023   Palpitations 01/10/2023   Hyperlipidemia 01/08/2023   Statin myopathy 12/24/2022   Altered bowel habits 10/05/2022   Overactive bladder 03/21/2022   Emphysema lung (HCC) 12/19/2021   Pseudodementia 12/19/2021   Abdominal pain 08/24/2021   Pure hypercholesterolemia 02/23/2021   Coronary artery disease involving native coronary artery of native heart without angina pectoris 02/23/2021   Memory loss of unknown cause 12/08/2019   MDD (major depressive disorder) 12/05/2019   Restless leg syndrome 11/16/2019   Seborrheic keratosis 04/21/2019   History of colon polyps 06/24/2018   Constitutional neutropenia 02/21/2018   Leukopenia 02/19/2018   Low back pain 03/29/2017   Seasonal allergies 03/29/2017   Vitamin D  deficiency 01/04/2016   Fatigue 07/16/2013   COPD (chronic obstructive pulmonary disease) (HCC) 03/17/2010   RHINITIS, ALLERGIC 11/14/2006   GERD (gastroesophageal reflux disease) 11/14/2006  Irritable bowel syndrome 11/14/2006   Home Medication(s) Prior to Admission medications   Medication Sig Start Date End Date Taking? Authorizing Provider  albuterol  (VENTOLIN  HFA) 108 (90 Base) MCG/ACT inhaler Inhale 1 puff into the lungs every 6 (six) hours as needed for wheezing or shortness of breath. USE 2 INHALATIONS BY MOUTH EVERY 6 HOURS AS NEEDED FOR WHEEZING  OR SHORTNESS OF BREATH 09/02/23   Tobie Gaines, DO  budesonide-glycopyrrolate-formoterol  (BREZTRI  AEROSPHERE) 160-9-4.8 MCG/ACT AERO inhaler Inhale 2 puffs into the lungs in the  morning and at bedtime. 05/11/24   Charley Conger, PA-C  budesonide-glycopyrrolate-formoterol  (BREZTRI  AEROSPHERE) 160-9-4.8 MCG/ACT AERO inhaler Inhale 2 puffs into the lungs in the morning and at bedtime. 06/09/24   Tobie Gaines, DO  carboxymethylcellul-glycerin (OPTIVE) 0.5-0.9 % ophthalmic solution 1 drop 4 (four) times daily as needed for dry eyes.    [provider]  cholecalciferol  (VITAMIN D3) 25 MCG (1000 UNIT) tablet Take 1 tablet (1,000 Units total) by mouth daily. 02/24/24   Zheng, Michael, DO  cromolyn (OPTICROM) 4 % ophthalmic solution Place 1 drop into both eyes 2 (two) times daily. 10/10/23   [provider]  Evolocumab  (REPATHA  SURECLICK) 140 MG/ML SOAJ Inject 140 mg into the skin every 14 (fourteen) days. Patient not taking: Reported on 05/11/2024 12/23/23   Santo Stanly LABOR, MD  fexofenadine  (ALLEGRA ) 180 MG tablet Take 1 tablet (180 mg total) by mouth daily. 12/24/22   Lou Claretta HERO, MD  mometasone  (NASONEX ) 50 MCG/ACT nasal spray PLACE 2 SPRAYS IN EACH NOSTRIL ONCE DAILY AS NEEDED. 06/09/24   Tobie Gaines, DO  Multiple Vitamins-Minerals (MULTIVITAMIN WITH MINERALS) tablet Take 1 tablet by mouth daily.    [provider]  pantoprazole  (PROTONIX ) 40 MG tablet Take 1 tablet (40 mg total) by mouth daily for 15 days. 03/05/24 05/11/24  Tobie Gaines, DO                                                                                                                                    Past Surgical History Past Surgical History:  Procedure Laterality Date   BREAST CYST EXCISION Right 2011   BREAST EXCISIONAL BIOPSY     benign   BREAST EXCISIONAL BIOPSY Right 1990   benign   INDUCED ABORTION  2005   VAGINAL HYSTERECTOMY  1983   partial; for abnormal bleeding   Family History Family History  Problem Relation Age of Onset   Hypertension Mother    Heart disease Mother    Diabetes Mother    Arthritis Mother    Stroke Father    Diabetes Father    Heart  disease Father    Hyperlipidemia Father    Hypertension Father    Kidney disease Father    Hypertension Sister    Breast cancer Sister    Breast cancer Sister    Hypertension Sister    Arthritis Sister    Hypertension Sister  Diabetes Sister    Mental illness Brother    Aneurysm Brother        brain   Arthritis Brother    Hypertension Maternal Aunt    Hypertension Maternal Uncle    Stomach cancer Maternal Uncle    Hypertension Paternal Aunt    Kidney disease Paternal Aunt        x 2   Hypertension Paternal Uncle    Kidney disease Paternal Uncle        x 2   Colon cancer Neg Hx    Esophageal cancer Neg Hx    Pancreatic cancer Neg Hx     Social History Social History   Tobacco Use   Smoking status: Former    Current packs/day: 0.00    Average packs/day: 0.3 packs/day for 43.0 years (14.2 ttl pk-yrs)    Types: Cigarettes    Start date: 01/06/1970    Quit date: 01/06/2013    Years since quitting: 11.6   Smokeless tobacco: Never  Vaping Use   Vaping status: Never Used  Substance Use Topics   Alcohol use: No    Alcohol/week: 0.0 standard drinks of alcohol   Drug use: Not Currently    Comment: x 26 yrs.   Allergies Dicyclomine , Hydrocodone , Lipitor [atorvastatin ], Penicillins, and Levsin  [hyoscyamine ]  Review of Systems Review of Systems  Physical Exam Vital Signs  I have reviewed the triage vital signs BP (!) 143/64   Pulse 65   Temp 98.7 F (37.1 C)   Resp 16   Ht 5' 3 (1.6 m)   Wt 67.1 kg   SpO2 98%   BMI 26.22 kg/m   Physical Exam Vitals and nursing note reviewed.  Constitutional:      General: She is not in acute distress.    Appearance: She is not toxic-appearing.  HENT:     Head: Normocephalic.  Eyes:     Pupils: Pupils are equal, round, and reactive to light.  Cardiovascular:     Rate and Rhythm: Normal rate.  Pulmonary:     Effort: Pulmonary effort is normal.  Abdominal:     General: Abdomen is flat. There is no distension.      Palpations: Abdomen is soft. There is no mass.     Tenderness: There is no abdominal tenderness.  Musculoskeletal:     Comments: Bilateral paraspinal muscle tenderness in the mid lumbar spine.  No midline spinous process tenderness step-offs or deformities.  Neurological:     General: No focal deficit present.     Mental Status: She is alert.     Comments: No saddle anesthesia.  Normal gait.  Normal sensation in the bilateral lower extremities.     ED Results and Treatments Labs (all labs ordered are listed, but only abnormal results are displayed) Labs Reviewed  CBC WITH DIFFERENTIAL/PLATELET - Abnormal; Notable for the following components:      Result Value   WBC 2.8 (*)    Neutro Abs 1.6 (*)    All other components within normal limits  URINALYSIS, W/ REFLEX TO CULTURE (INFECTION SUSPECTED) - Abnormal; Notable for the following components:   Color, Urine COLORLESS (*)    Leukocytes,Ua SMALL (*)    All other components within normal limits  BASIC METABOLIC PANEL WITH GFR  Radiology CT L-SPINE NO CHARGE Result Date: 08/18/2024 EXAM: CT OF THE LUMBAR SPINE WITH CONTRAST 08/18/2024 10:45:14 AM TECHNIQUE: CT of the lumbar spine was performed with the administration of 100 mL of iohexol  (OMNIPAQUE ) 300 MG/ML solution. Multiplanar reformatted images are provided for review. Automated exposure control, iterative reconstruction, and/or weight based adjustment of the mA/kV was utilized to reduce the radiation dose to as low as reasonably achievable. COMPARISON: None available. CLINICAL HISTORY: FINDINGS: BONES AND ALIGNMENT: Normal vertebral body heights. No acute fracture or suspicious bone lesion. Slight anterolisthesis of L3 on L4 and L5 on S1 related to facet disease. DEGENERATIVE CHANGES: Degenerative facet disease throughout the lumbar spine. Degenerative disc disease  most notable at L5-S1 with disc space narrowing and vacuum disc. SOFT TISSUES: No acute abnormality. IMPRESSION: 1. No acute bony abnormality. 2. Degenerative changes as described above. Electronically signed by: Franky Crease MD 08/18/2024 01:54 PM EST RP Workstation: [id number]    Pertinent labs & imaging results that were available during my care of the patient were reviewed by me and considered in my medical decision making (see MDM for details).  Medications Ordered in ED Medications  lidocaine  (LIDODERM ) 5 % 1 patch (1 patch Transdermal Patch Applied 08/18/24 0926)  ibuprofen  (ADVIL ) tablet 800 mg (800 mg Oral Given 08/18/24 0926)  acetaminophen  (TYLENOL ) tablet 1,000 mg (1,000 mg Oral Given 08/18/24 0926)  methocarbamol  (ROBAXIN ) tablet 500 mg (500 mg Oral Given 08/18/24 0926)  iohexol  (OMNIPAQUE ) 300 MG/ML solution 100 mL (100 mLs Intravenous Contrast Given 08/18/24 1035)                                                                                                                                     Procedures Procedures  (including critical care time)  Medical Decision Making / ED Course   This patient presents to the ED for concern of back pain, this involves an extensive number of treatment options, and is a complaint that carries with it a high risk of complications and morbidity.  The differential diagnosis includes musculoskeletal back pain, compression fractures, osteoporosis, malignant fracture, nephrolithiasis, less likely pyelonephritis.  MDM: Patient seems overall relatively benign.  I discussion with the patient about how her symptoms did seem to be consistent with musculoskeletal back pain, however the patient still had some additional concerns about her symptoms.  Will obtain further workup including imaging of the patient's abdomen, pelvis, lumbar spine with urinalysis and blood work.  Analgesia provided.   CT L-spine negative, pending CT abdomen pelvis.  Analgesia  improved.  Medication sent to pharmacy.  Signed out to Dr. Emil pending CT A/P read.  Patient CT resulted, shows some disc bulge, foraminal stenosis.  Patient without any neurological symptoms.  She will follow-up with her PCP.  Additional history obtained: -Additional history obtained from  -External records from outside source obtained and reviewed including: Chart review including previous notes, labs, imaging, consultation notes  Lab Tests: -I ordered, reviewed, and interpreted labs.   The pertinent results include:   Labs Reviewed  CBC WITH DIFFERENTIAL/PLATELET - Abnormal; Notable for the following components:      Result Value   WBC 2.8 (*)    Neutro Abs 1.6 (*)    All other components within normal limits  URINALYSIS, W/ REFLEX TO CULTURE (INFECTION SUSPECTED) - Abnormal; Notable for the following components:   Color, Urine COLORLESS (*)    Leukocytes,Ua SMALL (*)    All other components within normal limits  BASIC METABOLIC PANEL WITH GFR      Imaging Studies ordered: I ordered imaging studies including CT L-spine I independently visualized and interpreted imaging. I agree with the radiologist interpretation   Medicines ordered and prescription drug management: Meds ordered this encounter  Medications   ibuprofen  (ADVIL ) tablet 800 mg   acetaminophen  (TYLENOL ) tablet 1,000 mg   lidocaine  (LIDODERM ) 5 % 1 patch   methocarbamol  (ROBAXIN ) tablet 500 mg   iohexol  (OMNIPAQUE ) 300 MG/ML solution 100 mL    -I have reviewed the patients home medicines and have made adjustments as needed  Cardiac Monitoring: The patient was maintained on a cardiac monitor.  I personally viewed and interpreted the cardiac monitored which showed an underlying rhythm of: Normal sinus rhythm  Social Determinants of Health:  Factors impacting patients care include: Lack of access to primary care   Reevaluation: After the interventions noted above, I reevaluated the patient and found  that they have :improved  Co morbidities that complicate the patient evaluation  Past Medical History:  Diagnosis Date   Abdominal pain 08/16/2008   Qualifier: Diagnosis of  By: Gladis FNP, [name]     Abdominal pain, epigastric 09/08/2019   Allergy    seasonal, PCN, antidepressants, bentyl    Aortic atherosclerosis 02/23/2021   Arthritis    Asthma    Asymptomatic bacteriuria 08/24/2021   UA showed leukocytes but patient does not have dysuria, fevers or hematuria. She also had bacteruria in her last UA. Her symptoms of fatigue, and loss of appetite are non-specific but if persist or she begins having dysuria we can work up further. A UTI would not explain the abdominal pain patient is having.    Carpal tunnel syndrome on both sides    right worse than left 2008   Cataract 06/11/2013   COPD (chronic obstructive pulmonary disease) (HCC)    COVID-19 virus infection 06/18/2016   Eczema 05/02/2018   Food poisoning 04/04/2021   GERD (gastroesophageal reflux disease)    Hair loss 07/07/2019   Hyperlipidemia    LDL 108 JULY 2013   IBS (irritable bowel syndrome)    Macular rash 05/20/2019   Multiple nevi 03/01/2015   Pneumonia    Positive TB test    From M[facility] 2009 note : Recent F/u at [facility]. CXRAy negative.  Positive TST in 2007 (14mm).  Unable to tolerate INH due to hepatotoxicity   Primary insomnia 06/05/2012   Skin macule 04/21/2019   Tinnitus 07/07/2019   URI (upper respiratory infection) 11/01/2010   Qualifier: Diagnosis of  By: Gladis FNP, [name]        Dispostion: I considered admission for this patient, however with her improving symptoms, she is appropriate for discharge.     Final Clinical Impression(s) / ED Diagnoses Final diagnoses:  Acute low back pain without sciatica, unspecified back pain laterality     @PCDICTATION @    Mannie Pac T, DO 08/18/24 1506    Mannie Pac DASEN,  DO 08/18/24 1516

## 2024-08-18 NOTE — Telephone Encounter (Signed)
 Called patient after receiving a page that she had sudden onset back pain.  Patient reports sudden onset mid back pain lateral to the spine bilaterally that began around 10:30 PM on 08/17/2024.  Shortly before this started she had moved 2-3 books and placed them on the ground but otherwise had no other potentially inciting event and these books were not heavy.  She walked into another room and was about to lay down before the pain started.  She reported some left-sided chest pain that occurred when her back pain initially started but this has resolved and she denies any shortness of breath, presyncope, syncope, cough, abdominal pain, nausea, or lower extremity pain.  She does feel like her legs might give out from under her when she walks which in addition to her back pain has made it very difficult to walk.  She was able to get a heating pad and put it on her back with minimal benefit.  Discussed my concern of potentially life-threatening conditions that could cause this pain including acute coronary syndrome, aortic dissection, and pneumothorax that would require urgent evaluation in the emergency department.  Patient states she will call her son to try to get a ride and I strongly urged her to call an ambulance if she is unable to get to the emergency department in the next hour by personal vehicle.  Counseled the patient on worrisome symptoms including new shortness of breath, return of her chest pain, new chest pain, worsening or changing back pain, lower extremity claudication symptoms, presyncope, or syncope and she says that she will call ambulance immediately if any of these occur.  Before ending the call I again discussed my concern for potentially life-threatening conditions that could cause her pain and reiterated my recommendation that she be seen in the emergency department as soon as possible.  She states her understanding and as above will call her son and then call an ambulance if she is unable to  get to the hospital by personal vehicle in the next hour.  Fairy Pool, DO Internal Medicine Resident, PGY-3 Please contact the on call pager at [phone] for any urgent or emergent needs. 4:01 AM 08/18/2024

## 2024-08-18 NOTE — ED Triage Notes (Signed)
 Pt arrived POV caox4 ambulatory c/o bilat lower back pain worsening on movement and change of position that started last night after getting out bed to get a pencil and it hurt when getting back in bed. Denies any recent trauma or injury.

## 2024-08-20 NOTE — Telephone Encounter (Signed)
 Labs still waiting on patient

## 2024-08-21 ENCOUNTER — Other Ambulatory Visit: Payer: Self-pay

## 2024-08-25 NOTE — Telephone Encounter (Signed)
 Still waiting on labs.

## 2024-08-27 ENCOUNTER — Ambulatory Visit: Admitting: Physician Assistant

## 2024-08-27 VITALS — BP 124/90 | HR 97 | Ht 63.0 in | Wt 150.0 lb

## 2024-08-27 DIAGNOSIS — G3184 Mild cognitive impairment, so stated: Secondary | ICD-10-CM

## 2024-08-27 NOTE — Patient Instructions (Addendum)
 It was a pleasure to see you today at our office.   Recommendations:   Follow up in 6 month  For psychiatric meds, mood meds: Please have your primary care physician manage these medications.  If you have any severe symptoms of a stroke, or other severe issues such as confusion,severe chills or fever, etc call 911 or go to the ER as you may need to be evaluated further     For assessment of decision of mental capacity and competency:  Call Dr. Rosaline Nine, geriatric psychiatrist at [phone]  Counseling regarding caregiver distress, including caregiver depression, anxiety and issues regarding community resources, adult day care programs, adult living facilities, or memory care questions:  please contact your  Primary Doctor's Social Worker   Whom to call: Memory  decline, memory medications: Call our office [phone]    [email or web address]   RECOMMENDATIONS FOR ALL PATIENTS WITH MEMORY PROBLEMS: 1. Continue to exercise (Recommend 30 minutes of walking everyday, or 3 hours every week) 2. Increase social interactions - continue going to [place] and enjoy social gatherings with friends and family 3. Eat healthy, avoid fried foods and eat more fruits and vegetables 4. Maintain adequate blood pressure, blood sugar, and blood cholesterol level. Reducing the risk of stroke and cardiovascular disease also helps promoting better memory. 5. Avoid stressful situations. Live a simple life and avoid aggravations. Organize your time and prepare for the next day in anticipation. 6. Sleep well, avoid any interruptions of sleep and avoid any distractions in the bedroom that may interfere with adequate sleep quality 7. Avoid sugar, avoid sweets as there is a strong link between excessive sugar intake, diabetes, and cognitive impairment We discussed the Mediterranean diet, which has been shown to help patients reduce the risk of progressive  memory disorders and reduces cardiovascular risk. This includes eating fish, eat fruits and green leafy vegetables, nuts like almonds and hazelnuts, walnuts, and also use olive oil. Avoid fast foods and fried foods as much as possible. Avoid sweets and sugar as sugar use has been linked to worsening of memory function.  There is always a concern of gradual progression of memory problems. If this is the case, then we may need to adjust level of care according to patient needs. Support, both to the patient and caregiver, should then be put into place.        DRIVING: Regarding driving, in patients with progressive memory problems, driving will be impaired. We advise to have someone else do the driving if trouble finding directions or if minor accidents are reported. Independent driving assessment is available to determine safety of driving.   If you are interested in the driving assessment, you can contact the following:  [facility] [employer] [facility] in [place] [phone]  [facility] [phone]  [facility] [phone]  [facility] [phone] or [phone]   FALL PRECAUTIONS: Be cautious when walking. Scan the area for obstacles that may increase the risk of trips and falls. When getting up in the mornings, sit up at the edge of the bed for a few minutes before getting out of bed. Consider elevating the bed at the head end to avoid drop of blood pressure when getting up. Walk always in a well-lit room (use night lights in the walls). Avoid area rugs or power cords from appliances in the middle of the walkways. Use a walker or a cane if necessary and consider physical therapy for balance exercise. Get your eyesight checked regularly.  FINANCIAL OVERSIGHT: Supervision,  especially oversight when making financial decisions or transactions is also recommended.  HOME SAFETY: Consider the safety of the kitchen when operating appliances like stoves,  microwave oven, and blender. Consider having supervision and share cooking responsibilities until no longer able to participate in those. Accidents with firearms and other hazards in the house should be identified and addressed as well.   ABILITY TO BE LEFT ALONE: If patient is unable to contact 911 operator, consider using LifeLine, or when the need is there, arrange for someone to stay with patients. Smoking is a fire hazard, consider supervision or cessation. Risk of wandering should be assessed by caregiver and if detected at any point, supervision and safe proof recommendations should be instituted.  MEDICATION SUPERVISION: Inability to self-administer medication needs to be constantly addressed. Implement a mechanism to ensure safe administration of the medications.      Mediterranean Diet A Mediterranean diet refers to food and lifestyle choices that are based on the traditions of countries located on the [employer] [place]. This way of eating has been shown to help prevent certain conditions and improve outcomes for people who have chronic diseases, like kidney disease and heart disease. What are tips for following this plan? Lifestyle  Cook and eat meals together with your family, when possible. Drink enough fluid to keep your urine clear or pale yellow. Be physically active every day. This includes: Aerobic exercise like running or swimming. Leisure activities like gardening, walking, or housework. Get 7-8 hours of sleep each night. If recommended by your health care provider, drink red wine in moderation. This means 1 glass a day for nonpregnant women and 2 glasses a day for men. A glass of wine equals 5 oz (150 mL). Reading food labels  Check the serving size of packaged foods. For foods such as rice and pasta, the serving size refers to the amount of cooked product, not dry. Check the total fat in packaged foods. Avoid foods that have saturated fat or trans fats. Check the  ingredients list for added sugars, such as corn syrup. Shopping  At the grocery store, buy most of your food from the areas near the walls of the store. This includes: Fresh fruits and vegetables (produce). Grains, beans, nuts, and seeds. Some of these may be available in unpackaged forms or large amounts (in bulk). Fresh seafood. Poultry and eggs. Low-fat dairy products. Buy whole ingredients instead of prepackaged foods. Buy fresh fruits and vegetables in-season from local farmers markets. Buy frozen fruits and vegetables in resealable bags. If you do not have access to quality fresh seafood, buy precooked frozen shrimp or canned fish, such as tuna, salmon, or sardines. Buy small amounts of raw or cooked vegetables, salads, or olives from the deli or salad bar at your store. Stock your pantry so you always have certain foods on hand, such as olive oil, canned tuna, canned tomatoes, rice, pasta, and beans. Cooking  Cook foods with extra-virgin olive oil instead of using butter or other vegetable oils. Have meat as a side dish, and have vegetables or grains as your main dish. This means having meat in small portions or adding small amounts of meat to foods like pasta or stew. Use beans or vegetables instead of meat in common dishes like chili or lasagna. Experiment with different cooking methods. Try roasting or broiling vegetables instead of steaming or sauteing them. Add frozen vegetables to soups, stews, pasta, or rice. Add nuts or seeds for added healthy fat at each meal. You can  add these to yogurt, salads, or vegetable dishes. Marinate fish or vegetables using olive oil, lemon juice, garlic, and fresh herbs. Meal planning  Plan to eat 1 vegetarian meal one day each week. Try to work up to 2 vegetarian meals, if possible. Eat seafood 2 or more times a week. Have healthy snacks readily available, such as: Vegetable sticks with hummus. Greek yogurt. Fruit and nut trail mix. Eat  balanced meals throughout the week. This includes: Fruit: 2-3 servings a day Vegetables: 4-5 servings a day Low-fat dairy: 2 servings a day Fish, poultry, or lean meat: 1 serving a day Beans and legumes: 2 or more servings a week Nuts and seeds: 1-2 servings a day Whole grains: 6-8 servings a day Extra-virgin olive oil: 3-4 servings a day Limit red meat and sweets to only a few servings a month What are my food choices? Mediterranean diet Recommended Grains: Whole-grain pasta. Brown rice. Bulgar wheat. Polenta. Couscous. Whole-wheat bread. [place] Made[place]. Vegetables: Artichokes. Beets. Broccoli. Cabbage. Carrots. Eggplant. Green beans. Chard. Kale. Spinach. Onions. Leeks. Peas. Squash. Tomatoes. Peppers. Radishes. Fruits: Apples. Apricots. Avocado. Berries. Bananas. Cherries. Dates. Figs. Grapes. Lemons. Melon. Oranges. Peaches. Plums. Pomegranate. Meats and other protein foods: Beans. Almonds. Sunflower seeds. Pine nuts. Peanuts. Cod. Salmon. Scallops. Shrimp. Tuna. Tilapia. Clams. Oysters. Eggs. Dairy: Low-fat milk. Cheese. Greek yogurt. Beverages: Water. Red wine. Herbal tea. Fats and oils: Extra virgin olive oil. Avocado oil. Grape seed oil. Sweets and desserts: Greek yogurt with honey. Baked apples. Poached pears. Trail mix. Seasoning and other foods: Basil. Cilantro. Coriander. Cumin. Mint. Parsley. Sage. Rosemary. Tarragon. Garlic. Oregano. Thyme. Pepper. Balsalmic vinegar. Tahini. Hummus. Tomato sauce. Olives. Mushrooms. Limit these Grains: Prepackaged pasta or rice dishes. Prepackaged cereal with added sugar. Vegetables: Deep fried potatoes (french fries). Fruits: Fruit canned in syrup. Meats and other protein foods: Beef. Pork. Lamb. Poultry with skin. Hot dogs. [place]. Dairy: Ice cream. Sour cream. Whole milk. Beverages: Juice. Sugar-sweetened soft drinks. Beer. Liquor and spirits. Fats and oils: Butter. Canola oil. Vegetable oil. Beef fat (tallow). Lard. Sweets and desserts:  Cookies. Cakes. Pies. Candy. Seasoning and other foods: Mayonnaise. Premade sauces and marinades. The items listed may not be a complete list. Talk with your dietitian about what dietary choices are right for you. Summary The Mediterranean diet includes both food and lifestyle choices. Eat a variety of fresh fruits and vegetables, beans, nuts, seeds, and whole grains. Limit the amount of red meat and sweets that you eat. Talk with your health care provider about whether it is safe for you to drink red wine in moderation. This means 1 glass a day for nonpregnant women and 2 glasses a day for men. A glass of wine equals 5 oz (150 mL). This information is not intended to replace advice given to you by your health care provider. Make sure you discuss any questions you have with your health care provider. Document Released: 04/26/2016 Document Revised: 05/29/2016 Document Reviewed: 04/26/2016 Elsevier Interactive Patient Education  2017 [employer].

## 2024-08-27 NOTE — Progress Notes (Signed)
 Mild cognitive impairment of unclear etiology   Aimee Davis is a very pleasant 68 y.o. RH female with a history of extensive medical history including history of hyperlipidemia, asthma, arthritis, cataracts, COPD-emphysema, GERD, insomnia, vit D3 deficiency, anxiety, hypothyroidism,possible sleep apnea and a diagnosis of mild cognitive impairment of unclear etiology, likely multifactorial, including mild cerebrovascular disease, seasonal mood disorder and persistent possible medication sensitivity presenting today in follow-up for evaluation of memory loss. Patient is not on antidementia medication at this time..   Patient was last seen on 02/25/24. Memory is  stable with MMSE today 30/30. Patient is able to participate on ADLs and to drive without difficulties.   Recommend good control of cardiovascular risk factors.    Repeat neuropsych evaluation in 6 to 12 months for diagnostic clarity  Continue to control mood as per PCP, continue psychotherapy No indication for antidementia medication at this time Continue to follow-up COPD, possible sleep apnea with pulmonary    Discussed the use of AI scribe software for clinical note transcription with the patient, who gave verbal consent to proceed.  History of Present Illness  Aimee Davis is a 68 year old female with mild cognitive impairment who presents for follow-up of memory concerns.  Memory issues fluctuate with mood, attributed to seasonal changes. She was diagnosed with a seasonal mood disorder and recently started light therapy by her counselor. No hallucinations, paranoia, or major disorientation at home. She manages daily activities independently, attends Church twice a week and reads at home, though finds life a bit boring currently due to the season.   There is a possible history of sleep apnea, but it has not been formally tested due to insurance issues. She experiences daytime sleepiness and longer sleep durations, which she  attributes to weather and seasonal changes. Appetite is good, and she drinks plenty of water. She does not cook often and denies leaving the stove on. No major headaches or vision changes, though she reports slight vision changes at night. She has an upcoming eye appointment in January.  She is physically active, training at the [facility] to use exercise machines properly, and plans to participate in water aerobics. No tremors, loss of smell, incontinence, or bowel problems. She manages finances independently and has not fallen victim to scams.Sometimes takes medications later than intended.    She lives alone and drives without significant issues, occasionally using GPS for navigation.   Neuropsych evaluation 01/2024:   Briefly, results indicated variability across cognitive domains, with notable deficits in learning/memory, aspects of executive functioning, and confrontation naming. As functional independence is maintained, the most appropriate diagnosis at this time is mild cognitive impairment. Etiology of the deficits observed today is likely multifactorial, including mild cerebrovascular disease, persistent mood symptoms, sleep disturbance, and possible medication sensitivity/side effects. Patient recently discontinued some medications she believes were contributing to cognitive fogginess, though she was unable to specify which ones. She also noted an improvement in her sleep. Since these changes, she has experienced fewer episodes of mental fog, suggesting that better management of sleep and medications may support ongoing cognitive improvement. There is also a possibility of a longstanding learning disorder, as she reported lifelong difficulties with learning. Her history and aspects of her cognitive profile are consistent with this possibility. Finally, she performed poorly on a confrontation naming task and did not benefit from phonemic cueing. This raises the question of whether her naming difficulty  reflects limited familiarity with certain words--possibly related to educational background--or reduced  access to semantic knowledge, which could be an early marker of a neurodegenerative process such as Alzheimer's disease. In contrast, her performance on a semantic fluency task was within normal limits, which may argue against widespread semantic breakdown at this time. Given these naming difficulties in the context of learning/memory impairments, serial monitoring is warranted to rule out the possibility of an emerging neurodegenerative process.       Initial visit December 2024 How long did patient have memory difficulties?  All my life hd some issues but the s brain fog  was worse after  CoVID in  2022.  Reports some difficulty remembering new information, conversations and names.  Long-term memory is good. repeats oneself?  Endorsed by her son Disoriented when walking into a room?  Patient denies except occasionally not remembering what patient came to the room for    Leaving objects in unusual places?  May misplace some stuff but not in unusual places    Wandering behavior?  Denies  Any personality changes?  She has a history of anxiety, increased for the last month, not feeling safe if not surrounded by my family, and has been very tearful, depressed has been referred to behavioral health. She does report she was better off some psych meds.  Any history of depression?:  She does not know, she cries easily Hallucinations or paranoia?  Denies   Seizures?  Denies    Any sleep changes?   Does not sleep well, has insomnia, sleeping 4 to 5 hours a night and if she wakes up she can go back to sleep, Denies vivid dreams, REM behavior or sleepwalking   Sleep apnea?  Denies   Any hygiene concerns?  Denies   Independent of bathing and dressing?  Endorsed  Does the patient needs help with medications? Patient is in charge   Who is in charge of the finances? Patient is in charge     Any changes in  appetite?  Very little appetite, getting better .    Patient have trouble swallowing? Denies.   Does the patient cook?  Not much. Any kitchen accidents such as leaving the stove on?Recently she left the stove on Any history of headaches?   Denies.   Chronic pain ? Denies.   Ambulates with difficulty?  Denies.   Recent falls or head injuries? Denies.   Vision changes?  History of cataracts.  Unilateral weakness, numbness or tingling? Denies.   Any tremors?  Due to increased anxiety, she has been experiencing intermittent tremors without any other parkinsonian features. Any anosmia?  Sometimes I thing I do.   Any incontinence of urine? Denies.   Any bowel dysfunction? Denies. She has IBS constipation      Patient lives alone History of heavy alcohol intake? Denies.   History of heavy tobacco use? Denies.   Family history of dementia? MGM, Mat Grand Aunt  had dementia ?type  Does patient drive? Yes, recently she had a moment of confusion, driving to [employer] on to run some areas and get out of the house, but when she got there she could not remember why she was here.   Past Medical History:  Diagnosis Date   Abdominal pain 08/16/2008   Qualifier: Diagnosis of  By: Gladis FNP, [name]     Abdominal pain, epigastric 09/08/2019   Allergy    seasonal, PCN, antidepressants, bentyl    Aortic atherosclerosis 02/23/2021   Arthritis    Asthma    Asymptomatic bacteriuria 08/24/2021   UA showed  leukocytes but patient does not have dysuria, fevers or hematuria. She also had bacteruria in her last UA. Her symptoms of fatigue, and loss of appetite are non-specific but if persist or she begins having dysuria we can work up further. A UTI would not explain the abdominal pain patient is having.    Carpal tunnel syndrome on both sides    right worse than left 2008   Cataract 06/11/2013   COPD (chronic obstructive pulmonary disease) (HCC)    COVID-19 virus infection 06/18/2016   Eczema 05/02/2018   Food  poisoning 04/04/2021   GERD (gastroesophageal reflux disease)    Hair loss 07/07/2019   Hyperlipidemia    LDL 108 JULY 2013   IBS (irritable bowel syndrome)    Macular rash 05/20/2019   Multiple nevi 03/01/2015   Pneumonia    Positive TB test    From [facility] 2009 note : Recent F/u at [facility]. CXRAy negative.  Positive TST in 2007 (14mm).  Unable to tolerate INH due to hepatotoxicity   Primary insomnia 06/05/2012   Skin macule 04/21/2019   Tinnitus 07/07/2019   URI (upper respiratory infection) 11/01/2010   Qualifier: Diagnosis of  By: Gladis FNP, Delorise       Past Surgical History:  Procedure Laterality Date   BREAST CYST EXCISION Right 2011   BREAST EXCISIONAL BIOPSY     benign   BREAST EXCISIONAL BIOPSY Right 1990   benign   INDUCED ABORTION  2005   VAGINAL HYSTERECTOMY  1983   partial; for abnormal bleeding      Objective:     PHYSICAL EXAMINATION:    VITALS:   Vitals:   08/27/24 1505  BP: (!) 124/90  Pulse: 97  SpO2: 96%  Weight: 150 lb (68 kg)  Height: 5' 3 (1.6 m)    GEN:  The patient appears stated age and is in NAD. HEENT:  Normocephalic, atraumatic.   Neurological examination:  General: NAD, well-groomed, appears stated age. Orientation: The patient is alert. Oriented to person, place and to date. Cranial nerves: There is good facial symmetry.The speech is fluent and clear. No aphasia or dysarthria. Fund of knowledge is appropriate. Recent memory impaired and remote memory is normal.  Attention and concentration are normal.  Able to name objects and repeat phrases.  Hearing is intact to conversational tone.   Delayed recall 3/3 Sensation: Sensation is intact to light touch throughout Motor: Strength is at least antigravity x4. DTR's 2/4 in UE/LE      08/29/2023    1:00 PM  Montreal Cognitive Assessment   Visuospatial/ Executive (0/5) 3  Naming (0/3) 2  Attention: Read list of digits (0/2) 2  Attention: Read list of letters (0/1) 1  Attention: Serial 7  subtraction starting at 100 (0/3) 1  Language: Repeat phrase (0/2) 1  Language : Fluency (0/1) 1  Abstraction (0/2) 0  Delayed Recall (0/5) 2  Orientation (0/6) 6  Total 19  Adjusted Score (based on education) 20       04/29/2024   11:22 AM  MMSE - Mini Mental State Exam  Not completed: Unable to complete      Movement examination: Tone: There is normal tone in the UE/LE Abnormal movements:  no tremor.  No myoclonus.  No asterixis.   Coordination:  There is no decremation with RAM's. Normal finger to nose  Gait and Station: The patient has no difficulty arising out of a deep-seated chair without the use of the hands. The patient's stride length is  good.  Gait is cautious and narrow.   Thank you for allowing us  the opportunity to participate in the care of this nice patient. Please do not hesitate to contact us  for any questions or concerns.   Total time spent on today's visit was 39 minutes dedicated to this patient today, preparing to see patient, examining the patient, ordering tests and/or medications and counseling the patient, documenting clinical information in the EHR or other health record, independently interpreting results and communicating results to the patient/family, discussing treatment and goals, answering patient's questions and coordinating care.  Cc:  Tobie Gaines, DO  [facility] 08/27/2024 4:54 PM

## 2024-09-01 ENCOUNTER — Other Ambulatory Visit (HOSPITAL_COMMUNITY): Payer: Self-pay

## 2024-09-04 NOTE — Telephone Encounter (Signed)
 Still waiting on labs.

## 2024-09-08 ENCOUNTER — Telehealth: Payer: Self-pay

## 2024-09-08 NOTE — Telephone Encounter (Signed)
 Clinic was made aware in November that updated lipid panel was needed for Repatha  PA renewal. Patient should be aware labs are required to update PA. Pharmacy has been requesting via fax for over a month. Fax note was sent to [facility] noting that labs are still needed and to stop faxing for PA as we are unable to complete PA at this time.

## 2024-09-14 ENCOUNTER — Ambulatory Visit: Payer: Self-pay | Admitting: Student

## 2024-09-14 VITALS — BP 128/74 | HR 95 | Temp 97.9°F | Wt 151.6 lb

## 2024-09-14 DIAGNOSIS — Z87891 Personal history of nicotine dependence: Secondary | ICD-10-CM

## 2024-09-14 DIAGNOSIS — E782 Mixed hyperlipidemia: Secondary | ICD-10-CM | POA: Diagnosis not present

## 2024-09-14 DIAGNOSIS — R5383 Other fatigue: Secondary | ICD-10-CM | POA: Diagnosis not present

## 2024-09-14 DIAGNOSIS — R413 Other amnesia: Secondary | ICD-10-CM | POA: Diagnosis not present

## 2024-09-14 DIAGNOSIS — E559 Vitamin D deficiency, unspecified: Secondary | ICD-10-CM

## 2024-09-14 DIAGNOSIS — M545 Low back pain, unspecified: Secondary | ICD-10-CM

## 2024-09-14 DIAGNOSIS — Z7951 Long term (current) use of inhaled steroids: Secondary | ICD-10-CM | POA: Diagnosis not present

## 2024-09-14 DIAGNOSIS — J449 Chronic obstructive pulmonary disease, unspecified: Secondary | ICD-10-CM | POA: Diagnosis not present

## 2024-09-14 MED ORDER — BREZTRI AEROSPHERE 160-9-4.8 MCG/ACT IN AERO: 2.0000 | INHALATION_SPRAY | Freq: Two times a day (BID) | RESPIRATORY_TRACT | 3 refills | Status: AC

## 2024-09-14 NOTE — Patient Instructions (Addendum)
 Thank you, Aimee Davis for allowing us  to provide your care today. Today we discussed:  - I will check your TSH and Vitamin D  levels - I sent a referral for physical therapy - You can take tylenol  1000 mg every 6 hours as need for pain control  - Discuss DEXA   I have ordered the following labs for you:   Lab Orders         TSH         Vitamin D  (25 hydroxy)      Tests ordered today:  As above   Referrals ordered today:    Referral Orders         Ambulatory referral to Physical Therapy      I have ordered the following medication/changed the following medications:   Stop the following medications: Medications Discontinued During This Encounter  Medication Reason   budesonide-glycopyrrolate-formoterol  (BREZTRI  AEROSPHERE) 160-9-4.8 MCG/ACT AERO inhaler Reorder     Start the following medications: Meds ordered this encounter  Medications   budesonide-glycopyrrolate-formoterol  (BREZTRI  AEROSPHERE) 160-9-4.8 MCG/ACT AERO inhaler    Sig: Inhale 2 puffs into the lungs in the morning and at bedtime.    Dispense:  10.7 g    Refill:  3    Lot Number?:   [id number]    Expiration Date?:   06/11/2026    Manufacturer?:   [employer] [[id number]]    NDC:   9689-5383-71 [[id number]]    Quantity:   2     Follow up: 4-6 weeks for back pain  and fatigue    Remember:   Should you have any questions or concerns please call the internal medicine clinic at [phone].     Rayann Atway, D.O. [facility]

## 2024-09-14 NOTE — Assessment & Plan Note (Addendum)
 Denies any shortness of breath or wheezing.  Refills for Breztri  is sent to her pharmacy.

## 2024-09-14 NOTE — Assessment & Plan Note (Signed)
 Patient was recently seen in ED on 08/28/2024 with workup consisted of CT L-spine grade 1 anterolisthesis at L3-L4 and L5-S1; potential moderate bilateral foraminal stenosis L5-S1 due to disc bulging.  Foraminal stenosis at L4-5 due to biforaminal disc protrusion.  She was discharged home Robaxin  500 mg as needed.  Patient reports that symptom onset 08/28/2024, described as acute low back pain, no radiation to her bilateral lower extremities, because limitation with activity.  Reports that she went to ED, where they gave her Robaxin  and it helped.  Reports that since then, she has noticed a dull pain when she bends forward or when she is laying down.  Reports that the pain is not as severe as the initial symptom onset.  Denies any bowel or bladder incontinence denies any radiation or weakness of the bilateral lower extremities.  Denies any lifting of heavy objects or trauma.  Physical exam was notable for tenderness to palpation along the spinous processes/transverse processes of T11-L1 3, no paraspinal muscle tenderness noted.  Pain was reproduced with flexion of the right knee. -Referral for physical therapy sent - Patient is advised to take Tylenol  1000 mg every 6 hours as needed for pain control -Patient is advised to follow-up in 1 month, if her pain is not resolved, would recommend referral to sports medicine

## 2024-09-14 NOTE — Assessment & Plan Note (Signed)
 Vit D 23.2 > 02/20/2024, reports that she takes supplemental vitamin D .  Reports symptoms of fatigue, denies any major joint pain. -Follow vitamin D  level -DEXA scan

## 2024-09-14 NOTE — Assessment & Plan Note (Signed)
 Seen by neurology on 08/27/2024, MMSE 30/30 and able to participate on ADLs and drive without difficulties.

## 2024-09-14 NOTE — Progress Notes (Signed)
 "  Established Patient Office Visit  Subjective   Patient ID: Aimee Davis, female    DOB: [birth year]  Age: 68 y.o. MRN: [id number]  Chief Complaint  Patient presents with   Follow-up    Pt stated follow up regarding back.     Ms.Shatavia C Davis is a 68 y.o. female past medical history of hyperlipidemia, asthma, arthritis, cataracts, COPD-emphysema, GERD, insomnia, vitamin D3 deficiency, anxiety, hypothyroidism, mild cognitive impairment, mild cerebrovascular disease, seasonal mood disorder presents today for blood pressure check  Medications: COPD: Albuterol , Breztri , Optive  Vitamin D  deficiency: Vitamin D3 1000 units daily Hyperlipidemia: Repatha  140 mg every 14 days Allergic rhinitis: Allegra  180 mg daily, mometasone  nasal spray GERD: Protonix  40 mg twice daily Low back pain: Robaxin  100 mg twice daily as needed  Review of Systems:  As per assessment and Plan   Objective:     Vitals:   09/14/24 1344  BP: 128/74  Pulse: 95  Temp: 97.9 F (36.6 C)  TempSrc: Oral  SpO2: 95%  Weight: 151 lb 9.6 oz (68.8 kg)    Physical Exam General: Sitting in chair, no acute distress Cardiovascular: Regular rate Pulmonary: Breathing comfortably Abdomen: Soft, nontender, nondistended MSK: No lower extremity edema bilaterally  Last CBC Lab Results  Component Value Date   WBC 2.8 (L) 08/18/2024   HGB 12.1 08/18/2024   HCT 36.6 08/18/2024   MCV 83.4 08/18/2024   MCH 27.6 08/18/2024   RDW 12.8 08/18/2024   PLT 232 08/18/2024   Last metabolic panel Lab Results  Component Value Date   GLUCOSE 91 08/18/2024   NA 140 08/18/2024   K 3.8 08/18/2024   CL 105 08/18/2024   CO2 25 08/18/2024   BUN 11 08/18/2024   CREATININE 0.78 08/18/2024   GFRNONAA >60 08/18/2024   CALCIUM  10.0 08/18/2024   PROT 6.1 03/05/2024   ALBUMIN 4.4 03/05/2024   LABGLOB 1.7 03/05/2024   AGRATIO 2.7 (H) 11/23/2022   BILITOT 0.4 03/05/2024   ALKPHOS 88 03/05/2024   AST 26 03/05/2024   ALT 17  03/05/2024   ANIONGAP 10 08/18/2024   Last lipids Lab Results  Component Value Date   CHOL 201 (H) 12/18/2023   HDL 107 12/18/2023   LDLCALC 81 12/18/2023   TRIG 75 12/18/2023   CHOLHDL 1.9 12/18/2023      The ASCVD Risk score (Arnett DK, et al., 2019) failed to calculate for the following reasons:   The valid HDL cholesterol range is 20 to 100 mg/dL    Assessment & Plan:   Patient discussed with Dr. Shawn  Problem List Items Addressed This Visit       Respiratory   COPD (chronic obstructive pulmonary disease) (HCC)   Denies any shortness of breath or wheezing.  Refills for Breztri  is sent to her pharmacy.       Relevant Medications   budesonide-glycopyrrolate-formoterol  (BREZTRI  AEROSPHERE) 160-9-4.8 MCG/ACT AERO inhaler     Other   Fatigue - Primary   Check TSH      Relevant Orders   TSH   Vitamin D  deficiency   Vit D 23.2 > 02/20/2024, reports that she takes supplemental vitamin D .  Reports symptoms of fatigue, denies any major joint pain. -Follow vitamin D  level -DEXA scan      Relevant Orders   DG Bone Density   Vitamin D  (25 hydroxy)   Low back pain   Patient was recently seen in ED on 08/28/2024 with workup consisted of CT L-spine grade 1 anterolisthesis  at L3-L4 and L5-S1; potential moderate bilateral foraminal stenosis L5-S1 due to disc bulging.  Foraminal stenosis at L4-5 due to biforaminal disc protrusion.  She was discharged home Robaxin  500 mg as needed.  Patient reports that symptom onset 08/28/2024, described as acute low back pain, no radiation to her bilateral lower extremities, because limitation with activity.  Reports that she went to ED, where they gave her Robaxin  and it helped.  Reports that since then, she has noticed a dull pain when she bends forward or when she is laying down.  Reports that the pain is not as severe as the initial symptom onset.  Denies any bowel or bladder incontinence denies any radiation or weakness of the bilateral  lower extremities.  Denies any lifting of heavy objects or trauma.  Physical exam was notable for tenderness to palpation along the spinous processes/transverse processes of T11-L1 3, no paraspinal muscle tenderness noted.  Pain was reproduced with flexion of the right knee. -Referral for physical therapy sent - Patient is advised to take Tylenol  1000 mg every 6 hours as needed for pain control -Patient is advised to follow-up in 1 month, if her pain is not resolved, would recommend referral to sports medicine      Relevant Orders   Ambulatory referral to Physical Therapy   Memory loss of unknown cause   Seen by neurology on 08/27/2024, MMSE 30/30 and able to participate on ADLs and drive without difficulties.      Hyperlipidemia    Return in about 1 month (around 10/15/2024), or back pain and fatigue.    Toma Edwards, DO "

## 2024-09-14 NOTE — Assessment & Plan Note (Signed)
 Check TSH

## 2024-09-14 NOTE — Progress Notes (Signed)
 Internal Medicine Clinic Attending  Case discussed with the resident at the time of the visit.  We reviewed the resident's history and exam and pertinent patient test results.  I agree with the assessment, diagnosis, and plan of care documented in the resident's note.

## 2024-09-15 LAB — LIPID PANEL
Chol/HDL Ratio: 1.9 ratio (ref 0.0–4.4)
Cholesterol, Total: 222 mg/dL — ABNORMAL HIGH (ref 100–199)
HDL: 116 mg/dL
LDL Chol Calc (NIH): 97 mg/dL (ref 0–99)
Triglycerides: 50 mg/dL (ref 0–149)
VLDL Cholesterol Cal: 9 mg/dL (ref 5–40)

## 2024-09-15 LAB — TSH: TSH: 2.69 u[IU]/mL (ref 0.450–4.500)

## 2024-09-15 LAB — VITAMIN D 25 HYDROXY (VIT D DEFICIENCY, FRACTURES): Vit D, 25-Hydroxy: 29.9 ng/mL — ABNORMAL LOW (ref 30.0–100.0)

## 2024-09-16 ENCOUNTER — Ambulatory Visit: Payer: Self-pay

## 2024-09-18 ENCOUNTER — Telehealth: Payer: Self-pay | Admitting: *Deleted

## 2024-09-18 ENCOUNTER — Ambulatory Visit: Payer: Self-pay

## 2024-09-18 ENCOUNTER — Other Ambulatory Visit: Payer: Self-pay | Admitting: Student

## 2024-09-18 DIAGNOSIS — E559 Vitamin D deficiency, unspecified: Secondary | ICD-10-CM

## 2024-09-18 MED ORDER — VITAMIN D 25 MCG (1000 UNIT) PO TABS: 1000.0000 [IU] | ORAL_TABLET | Freq: Every day | ORAL | 3 refills | Status: AC

## 2024-09-18 MED ORDER — VITAMIN D (ERGOCALCIFEROL) 1.25 MG (50000 UNIT) PO CAPS: 50000.0000 [IU] | ORAL_CAPSULE | ORAL | 0 refills | Status: AC

## 2024-09-18 NOTE — Telephone Encounter (Signed)
 FYI Only or Action Required?: Action required by provider: lab or test result follow-up needed.  Patient was last seen in primary care on 09/14/2024 by Heddy Barren, DO.  Called Nurse Triage reporting Labs Only.    Triage Disposition: Call PCP Within 24 Hours  Patient/caregiver understands and will follow disposition?: Yes     Copied from CRM #[id number]. Topic: Clinical - Red Word Triage >> Sep 18, 2024 10:29 AM Aimee Davis wrote: Red Word that prompted transfer to Nurse Triage: patient having extreme fatigue and called about lab results and she states she is tired all the time very very tired fatigued and all she wants to do is lay around and no energy at all   Pt num  [phone]   Informed the pt that the office is closed and the lab results are not in words and she read mychart and was calling about mychart results and since she could not speak with the RN who wanted the call back she is concerned Reason for Disposition  Caller requesting lab results  (Exception: Routine or non-urgent lab result.)  Answer Assessment - Initial Assessment Questions 1. REASON FOR CALL or QUESTION: What is your reason for calling today? or How can I best    Patient seen abnormal labs results in MyChart and seeking provider to call back with interpretation, next steps. Patient stated she'd like to hear back today 1/2. Please advise.  Protocols used: PCP Call - No Triage-A-AH

## 2024-09-18 NOTE — Progress Notes (Signed)
 Pt vitamin D  deficient and experiencing symptoms per most recent office visit. I will send her a larger dose of vit D for 8 week course and she will then return to her daily regimen of 2000U daily after this. Plan shared with Ms Blasco by phone and she understands. Would recommend recheck vit D at next office visit.  Lonni Africa, DO

## 2024-09-18 NOTE — Telephone Encounter (Signed)
 Copied from CRM #[id number]. Topic: Clinical - Lab/Test Results >> Sep 18, 2024 10:12 AM DeAngela L wrote: Reason for CRM: patient returning call about Lab results  Roxie LITTIE Louder, RN wanted the Pt to call back  The patient is really concerned about the results she see in MyChart and concerned   Pt num  [phone]

## 2024-09-22 NOTE — Telephone Encounter (Signed)
 Please see Dr. Zelma note for results.

## 2024-09-24 NOTE — Progress Notes (Signed)
 " Cardiology Office Note   Date:  09/25/2024  ID:  Aimee Davis, DOB [birth year], MRN [id number] PCP: Aimee Gaines, DO  [facility] Providers Cardiologist:  Aimee FORBES Sorrow, MD (Inactive)   History of Present Illness Aimee Davis is a 69 y.o. female with a past medical history of chronic fatigue, GERD and asthma here for follow-up appointment.  Was last seen 03/22/2023.  Was seen for DOE and chest pain.  Coronary CTA 11/2020 with minimal disease in RCA.  Calcium  score of 90 (80/3% for age and sex matched control).  TTE 12/2020 with LVEF 66 5%, normal RV, no significant valvular disease.  Deemed that her symptoms were noncardiac in nature.  She was admitted 12/2022 for chest pressure.  Deemed to be noncardiac in nature given extensive prior reassuring workup.  When she was last seen back in July 2024 overall was feeling well.  Having some intermittent palpitations that have been ongoing since her hospitalization.  Cardiac monitor with associated SVE but no significant arrhythmias.  Symptoms seem to have increased with her inhaler use.  Otherwise, no chest pain, orthopnea, or PND.  Continues to have dyspnea on exertion that is worsened in summer months.  Has been using her inhaler more.  She is hoping to be referred to a pulmonologist to assess further.  Next  Today, she presents with a history of hyperlipidemia and PVCs  with concerns about cholesterol management and irregular heartbeats.  She has hyperlipidemia with prior adverse reaction to Repatha  causing itching, burning, and skin discoloration lasting over a week, and she has not taken any cholesterol medication since. She previously tried Lipitor and had statin-related issues. Recent labs showed LDL 97.  She has PVCs with fatigue, a feeling of giving out of breath, and higher than usual heart rates. She feels exhausted with exertion and needs to sit frequently. She had a heart monitor test in March 2024.  Her diet is mainly  vegetables with low intake of fried foods, red meat, and pork, and she drinks mostly water. She exercises but less than her goal of five days per week.  She was recently diagnosed with low vitamin D  and started weekly supplementation two weeks ago. She feels sluggish and has not noticed improvement yet.  She denies caffeine intake and drinks herbal teas occasionally  Reports no shortness of breath nor dyspnea on exertion. Reports no chest pain, pressure, or tightness. No edema, orthopnea, PND. Reports no palpitations.   Discussed the use of AI scribe software for clinical note transcription with the patient, who gave verbal consent to proceed.  ROS: Pertinent ROS in HPI  Studies Reviewed EKG Interpretation Date/Time:  Friday September 25 2024 13:42:15 EST Ventricular Rate:  121 PR Interval:  156 QRS Duration:  88 QT Interval:  348 QTC Calculation: 494 R Axis:   -9  Text Interpretation: Sinus rhythm with frequent and consecutive Premature ventricular complexes and Fusion complexes Possible Left atrial enlargement When compared with ECG of 13-Jul-2023 19:28, PREVIOUS ECG IS PRESENT Confirmed by Lucien Blanc [phone]) on 09/25/2024 5:08:11 PM    Sonographer Comments: Image acquisition challenging due to respiratory  motion.  IMPRESSIONS     1. Left ventricular ejection fraction, by estimation, is 55 to 60%. Left  ventricular ejection fraction by 3D volume is 57 %. The left ventricle has  normal function. The left ventricle has no regional wall motion  abnormalities. Left ventricular diastolic   parameters were normal.   2. Right ventricular systolic function is  normal. The right ventricular  size is normal. There is normal pulmonary artery systolic pressure. The  estimated right ventricular systolic pressure is 25.1 mmHg.   3. The mitral valve is normal in structure. Mild mitral valve  regurgitation. No evidence of mitral stenosis.   4. The aortic valve is tricuspid. Aortic valve  regurgitation is not  visualized. Aortic valve sclerosis is present, with no evidence of aortic  valve stenosis.   5. The inferior vena cava is normal in size with greater than 50%  respiratory variability, suggesting right atrial pressure of 3 mmHg.   6. Possible trivial circumferential pericardial effusion is present.   FINDINGS   Left Ventricle: Left ventricular ejection fraction, by estimation, is 55  to 60%. Left ventricular ejection fraction by 3D volume is 57 %. The left  ventricle has normal function. The left ventricle has no regional wall  motion abnormalities. The left  ventricular internal cavity size was normal in size. There is no left  ventricular hypertrophy. Left ventricular diastolic parameters were  normal. Normal left ventricular filling pressure.   Right Ventricle: The right ventricular size is normal. No increase in  right ventricular wall thickness. Right ventricular systolic function is  normal. There is normal pulmonary artery systolic pressure. The tricuspid  regurgitant velocity is 2.35 m/s, and   with an assumed right atrial pressure of 3 mmHg, the estimated right  ventricular systolic pressure is 25.1 mmHg.   Left Atrium: Left atrial size was normal in size.   Right Atrium: Right atrial size was normal in size.   Pericardium: Trivial pericardial effusion is present. The pericardial  effusion is circumferential.   Mitral Valve: The mitral valve is normal in structure. Mild mitral valve  regurgitation. No evidence of mitral valve stenosis. MV peak gradient, 5.3  mmHg. The mean mitral valve gradient is 3.0 mmHg.   Tricuspid Valve: The tricuspid valve is normal in structure. Tricuspid  valve regurgitation is mild . No evidence of tricuspid stenosis.   Aortic Valve: The aortic valve is tricuspid. Aortic valve regurgitation is  not visualized. Aortic valve sclerosis is present, with no evidence of  aortic valve stenosis.   Pulmonic Valve: The pulmonic  valve was normal in structure. Pulmonic valve  regurgitation is mild. No evidence of pulmonic stenosis.   Aorta: The aortic root is normal in size and structure.   Venous: The inferior vena cava is normal in size with greater than 50%  respiratory variability, suggesting right atrial pressure of 3 mmHg.   IAS/Shunts: No atrial level shunt detected by color flow Doppler.       Physical Exam VS:  BP (!) 102/56   Pulse 86   Ht 5' 3.5 (1.613 m)   Wt 152 lb (68.9 kg)   SpO2 97%   BMI 26.50 kg/m        Wt Readings from Last 3 Encounters:  09/25/24 152 lb (68.9 kg)  09/14/24 151 lb 9.6 oz (68.8 kg)  08/27/24 150 lb (68 kg)    GEN: Well nourished, well developed in no acute distress NECK: No JVD; No carotid bruits CARDIAC: RRR, no murmurs, rubs, gallops RESPIRATORY:  Clear to auscultation without rales, wheezing or rhonchi  ABDOMEN: Soft, non-tender, non-distended EXTREMITIES:  No edema; No deformity   ASSESSMENT AND PLAN  Mixed hyperlipidemia LDL cholesterol is 97 mg/dL, above target for coronary artery disease. Total cholesterol elevated due to high HDL. Previous statin and Repatha  intolerance due to skin reactions. Ezetimibe  considered as alternative. -  Prescribed ezetimibe  (Zetia ) to lower LDL cholesterol. - Repeat cholesterol labs in 3 months to assess response.  Atherosclerotic heart disease of native coronary artery/aortic atherosclerosis Management focuses on lipid control to prevent progression.  Ventricular premature depolarizations Recent EKG shows increased heart rate and frequent PVCs. Symptoms include fatigue and breathlessness. No increased stroke risk, but potential decreased heart function. Normal potassium levels. No correlation with medications or supplements. - Ordered 14-day Zio patch heart monitor to assess PVCs. - Scheduled echocardiogram to evaluate heart function and structure. - Monitor symptoms and adjust treatment based on results.      Dispo:  She can return in 2 to 3 months to see me to review results.  Signed, Orren LOISE Fabry, PA-C   "

## 2024-09-25 ENCOUNTER — Ambulatory Visit

## 2024-09-25 ENCOUNTER — Ambulatory Visit: Attending: Physician Assistant | Admitting: Physician Assistant

## 2024-09-25 ENCOUNTER — Encounter: Payer: Self-pay | Admitting: Physician Assistant

## 2024-09-25 VITALS — BP 102/56 | HR 86 | Ht 63.5 in | Wt 152.0 lb

## 2024-09-25 DIAGNOSIS — I2510 Atherosclerotic heart disease of native coronary artery without angina pectoris: Secondary | ICD-10-CM

## 2024-09-25 DIAGNOSIS — E782 Mixed hyperlipidemia: Secondary | ICD-10-CM | POA: Diagnosis not present

## 2024-09-25 DIAGNOSIS — E785 Hyperlipidemia, unspecified: Secondary | ICD-10-CM | POA: Diagnosis not present

## 2024-09-25 DIAGNOSIS — R0609 Other forms of dyspnea: Secondary | ICD-10-CM

## 2024-09-25 DIAGNOSIS — R002 Palpitations: Secondary | ICD-10-CM | POA: Diagnosis not present

## 2024-09-25 DIAGNOSIS — I700 Atherosclerosis of aorta: Secondary | ICD-10-CM

## 2024-09-25 MED ORDER — EZETIMIBE 10 MG PO TABS: 10.0000 mg | ORAL_TABLET | Freq: Every day | ORAL | 1 refills | Status: AC

## 2024-09-25 NOTE — Patient Instructions (Signed)
 Medication Instructions:  START Zetia  (ezetimibe ) 10mg  Take 1 tablet once a day  *If you need a refill on your cardiac medications before your next appointment, please call your pharmacy*  Lab Work: COMPLETE LABS IN 2-3 MONTHS OR BEFORE NEXT APPOINTMENT-FASTING LIPIDS & LFT If you have labs (blood work) drawn today and your tests are completely normal, you will receive your results only by: MyChart Message (if you have MyChart) OR A paper copy in the mail If you have any lab test that is abnormal or we need to change your treatment, we will call you to review the results.  Testing/Procedures: Your physician has requested that you have an echocardiogram. Echocardiography is a painless test that uses sound waves to create images of your heart. It provides your doctor with information about the size and shape of your heart and how well your hearts chambers and valves are working. This procedure takes approximately one hour. There are no restrictions for this procedure. Please do NOT wear cologne, perfume, aftershave, or lotions (deodorant is allowed). Please arrive 15 minutes prior to your appointment time.  Please note: We ask at that you not bring children with you during ultrasound (echo/ vascular) testing. Due to room size and safety concerns, children are not allowed in the ultrasound rooms during exams. Our front office staff cannot provide observation of children in our lobby area while testing is being conducted. An adult accompanying a patient to their appointment will only be allowed in the ultrasound room at the discretion of the ultrasound technician under special circumstances. We apologize for any inconvenience.  ZIO XT- Long Term Monitor Instructions  Your physician has requested you wear a ZIO patch monitor for 14 days.  This is a single patch monitor. [employer] supplies one patch monitor per enrollment. Additional stickers are not available. Please do not apply patch if you will be  having a Nuclear Stress Test,  Echocardiogram, Cardiac CT, MRI, or Chest Xray during the period you would be wearing the  monitor. The patch cannot be worn during these tests. You cannot remove and re-apply the  ZIO XT patch monitor.  Your ZIO patch monitor will be mailed 3 day USPS to your address on file. It may take 3-5 days  to receive your monitor after you have been enrolled.  Once you have received your monitor, please review the enclosed instructions. Your monitor  has already been registered assigning a specific monitor serial # to you.  Billing and Patient Assistance Program Information  We have supplied [employer] with any of your insurance information on file for billing purposes. [employer] offers a sliding scale Patient Assistance Program for patients that do not have  insurance, or whose insurance does not completely cover the cost of the ZIO monitor.  You must apply for the Patient Assistance Program to qualify for this discounted rate.  To apply, please call [employer] at [phone], select option 4, select option 2, ask to apply for  Patient Assistance Program. Meredeth will ask your household income, and how many people  are in your household. They will quote your out-of-pocket cost based on that information.  [employer] will also be able to set up a 34-month, interest-free payment plan if needed.  Applying the monitor   Shave hair from upper left chest.  Hold abrader disc by orange tab. Rub abrader in 40 strokes over the upper left chest as  indicated in your monitor instructions.  Clean area with 4 enclosed alcohol pads. Let dry.  Apply  patch as indicated in monitor instructions. Patch will be placed under collarbone on left  side of chest with arrow pointing upward.  Rub patch adhesive wings for 2 minutes. Remove white label marked 1. Remove the white  label marked 2. Rub patch adhesive wings for 2 additional minutes.  While looking in a mirror, press and release button  in center of patch. A small green light will  flash 3-4 times. This will be your only indicator that the monitor has been turned on.  Do not shower for the first 24 hours. You may shower after the first 24 hours.  Press the button if you feel a symptom. You will hear a small click. Record Date, Time and  Symptom in the Patient Logbook.  When you are ready to remove the patch, follow instructions on the last 2 pages of Patient  Logbook. Stick patch monitor onto the last page of Patient Logbook.  Place Patient Logbook in the blue and white box. Use locking tab on box and tape box closed  securely. The blue and white box has prepaid postage on it. Please place it in the mailbox as  soon as possible. Your physician should have your test results approximately 7 days after the  monitor has been mailed back to [facility].  Call [facility] Customer Care at [phone] if you have questions regarding  your ZIO XT patch monitor. Call them immediately if you see an orange light blinking on your  monitor.  If your monitor falls off in less than 4 days, contact our Monitor department at [phone].  If your monitor becomes loose or falls off after 4 days call [employer] at [phone] for  suggestions on securing your monitor   Follow-Up: At [facility], you and your health needs are our priority.  As part of our continuing mission to provide you with exceptional heart care, our providers are all part of one team.  This team includes your primary Cardiologist (physician) and Advanced Practice Providers or APPs (Physician Assistants and Nurse Practitioners) who all work together to provide you with the care you need, when you need it.  Your next appointment:   2-3 month(s)  Provider:   Emeline Calender, MD or Orren Fabry, PA  We recommend signing up for the patient portal called MyChart.  Sign up information is provided on this After Visit Summary.  MyChart is used to connect with  patients for Virtual Visits (Telemedicine).  Patients are able to view lab/test results, encounter notes, upcoming appointments, etc.  Non-urgent messages can be sent to your provider as well.   To learn more about what you can do with MyChart, go to [email or web address].   Other Instructions

## 2024-09-25 NOTE — Progress Notes (Unsigned)
 Applied a 14 day Zio XT monitor to patient in the office  Kriste to read

## 2024-10-20 DIAGNOSIS — R0609 Other forms of dyspnea: Secondary | ICD-10-CM

## 2024-10-20 DIAGNOSIS — E782 Mixed hyperlipidemia: Secondary | ICD-10-CM | POA: Diagnosis not present

## 2024-10-20 DIAGNOSIS — I700 Atherosclerosis of aorta: Secondary | ICD-10-CM | POA: Diagnosis not present

## 2024-10-20 DIAGNOSIS — R002 Palpitations: Secondary | ICD-10-CM

## 2024-10-20 DIAGNOSIS — I2510 Atherosclerotic heart disease of native coronary artery without angina pectoris: Secondary | ICD-10-CM

## 2024-10-21 ENCOUNTER — Ambulatory Visit: Payer: Self-pay | Admitting: Physician Assistant

## 2024-11-25 ENCOUNTER — Ambulatory Visit (HOSPITAL_COMMUNITY)

## 2024-11-26 ENCOUNTER — Ambulatory Visit: Admitting: Physician Assistant

## 2025-02-25 ENCOUNTER — Ambulatory Visit: Payer: Self-pay | Admitting: Physician Assistant

## 2025-05-05 ENCOUNTER — Ambulatory Visit
